# Patient Record
Sex: Female | Born: 1955
Health system: Southern US, Community
[De-identification: ages and names within clinical notes are randomized; demographics above are authoritative.]

## PROBLEM LIST (undated history)

## (undated) ENCOUNTER — Ambulatory Visit: Admission: EM | Payer: Medicare HMO

## (undated) DIAGNOSIS — R519 Headache, unspecified: Secondary | ICD-10-CM

## (undated) DIAGNOSIS — E785 Hyperlipidemia, unspecified: Secondary | ICD-10-CM

## (undated) DIAGNOSIS — R7302 Impaired glucose tolerance (oral): Secondary | ICD-10-CM

## (undated) DIAGNOSIS — J449 Chronic obstructive pulmonary disease, unspecified: Secondary | ICD-10-CM

## (undated) DIAGNOSIS — J329 Chronic sinusitis, unspecified: Secondary | ICD-10-CM

## (undated) DIAGNOSIS — I509 Heart failure, unspecified: Secondary | ICD-10-CM

## (undated) DIAGNOSIS — E119 Type 2 diabetes mellitus without complications: Secondary | ICD-10-CM

## (undated) DIAGNOSIS — J40 Bronchitis, not specified as acute or chronic: Secondary | ICD-10-CM

## (undated) DIAGNOSIS — R51 Headache: Secondary | ICD-10-CM

## (undated) DIAGNOSIS — I2510 Atherosclerotic heart disease of native coronary artery without angina pectoris: Secondary | ICD-10-CM

## (undated) DIAGNOSIS — I10 Essential (primary) hypertension: Secondary | ICD-10-CM

## (undated) DIAGNOSIS — I519 Heart disease, unspecified: Secondary | ICD-10-CM

## (undated) HISTORY — DX: Headache, unspecified: R51.9

## (undated) HISTORY — DX: Chronic obstructive pulmonary disease, unspecified: J44.9

## (undated) HISTORY — DX: Hyperlipidemia, unspecified: E78.5

## (undated) HISTORY — DX: Heart disease, unspecified: I51.9

## (undated) HISTORY — DX: Headache: R51

## (undated) HISTORY — DX: Impaired glucose tolerance (oral): R73.02

## (undated) HISTORY — DX: Atherosclerotic heart disease of native coronary artery without angina pectoris: I25.10

---

## 1999-11-28 ENCOUNTER — Other Ambulatory Visit: Admission: RE | Admit: 1999-11-28 | Discharge: 1999-11-28 | Payer: Self-pay | Admitting: Obstetrics and Gynecology

## 2001-06-16 ENCOUNTER — Emergency Department (HOSPITAL_COMMUNITY): Admission: EM | Admit: 2001-06-16 | Discharge: 2001-06-16 | Payer: Self-pay | Admitting: Emergency Medicine

## 2001-09-09 ENCOUNTER — Other Ambulatory Visit: Admission: RE | Admit: 2001-09-09 | Discharge: 2001-09-09 | Payer: Self-pay | Admitting: Internal Medicine

## 2001-10-21 HISTORY — PX: CORONARY ARTERY BYPASS GRAFT: SHX141

## 2002-03-13 ENCOUNTER — Encounter: Payer: Self-pay | Admitting: Emergency Medicine

## 2002-03-13 ENCOUNTER — Inpatient Hospital Stay (HOSPITAL_COMMUNITY): Admission: EM | Admit: 2002-03-13 | Discharge: 2002-03-21 | Payer: Self-pay | Admitting: Emergency Medicine

## 2002-03-15 ENCOUNTER — Encounter: Payer: Self-pay | Admitting: Thoracic Surgery (Cardiothoracic Vascular Surgery)

## 2002-03-16 ENCOUNTER — Encounter: Payer: Self-pay | Admitting: Thoracic Surgery (Cardiothoracic Vascular Surgery)

## 2002-03-17 ENCOUNTER — Encounter: Payer: Self-pay | Admitting: Thoracic Surgery (Cardiothoracic Vascular Surgery)

## 2002-03-18 ENCOUNTER — Encounter: Payer: Self-pay | Admitting: Thoracic Surgery (Cardiothoracic Vascular Surgery)

## 2002-03-19 ENCOUNTER — Encounter: Payer: Self-pay | Admitting: *Deleted

## 2002-04-06 ENCOUNTER — Encounter (HOSPITAL_COMMUNITY): Admission: RE | Admit: 2002-04-06 | Discharge: 2002-05-17 | Payer: Self-pay | Admitting: Cardiovascular Disease

## 2002-04-19 ENCOUNTER — Encounter: Payer: Self-pay | Admitting: Thoracic Surgery (Cardiothoracic Vascular Surgery)

## 2002-04-19 ENCOUNTER — Encounter
Admission: RE | Admit: 2002-04-19 | Discharge: 2002-04-19 | Payer: Self-pay | Admitting: Thoracic Surgery (Cardiothoracic Vascular Surgery)

## 2002-08-31 ENCOUNTER — Emergency Department (HOSPITAL_COMMUNITY): Admission: EM | Admit: 2002-08-31 | Discharge: 2002-08-31 | Payer: Self-pay | Admitting: Emergency Medicine

## 2002-08-31 ENCOUNTER — Encounter: Payer: Self-pay | Admitting: Emergency Medicine

## 2005-06-06 ENCOUNTER — Emergency Department (HOSPITAL_COMMUNITY): Admission: EM | Admit: 2005-06-06 | Discharge: 2005-06-06 | Payer: Self-pay | Admitting: Emergency Medicine

## 2009-10-17 ENCOUNTER — Observation Stay (HOSPITAL_COMMUNITY): Admission: EM | Admit: 2009-10-17 | Discharge: 2009-10-17 | Payer: Self-pay | Admitting: Emergency Medicine

## 2011-01-21 LAB — CBC
HCT: 48.7 % — ABNORMAL HIGH (ref 36.0–46.0)
MCV: 83.2 fL (ref 78.0–100.0)
Platelets: 238 10*3/uL (ref 150–400)
RDW: 15.9 % — ABNORMAL HIGH (ref 11.5–15.5)
WBC: 9.5 10*3/uL (ref 4.0–10.5)

## 2011-01-21 LAB — COMPREHENSIVE METABOLIC PANEL
AST: 29 U/L (ref 0–37)
Alkaline Phosphatase: 91 U/L (ref 39–117)
CO2: 27 mEq/L (ref 19–32)
Chloride: 102 mEq/L (ref 96–112)
Creatinine, Ser: 0.51 mg/dL (ref 0.4–1.2)
GFR calc Af Amer: >60 mL/min (ref >60)
GFR calc non Af Amer: >60 mL/min (ref >60)
Potassium: 4.1 mEq/L (ref 3.5–5.1)
Total Bilirubin: 0.5 mg/dL (ref 0.3–1.2)

## 2011-01-21 LAB — DIFFERENTIAL
Basophils Absolute: 0.1 10*3/uL (ref 0.0–0.1)
Basophils Relative: 1 % (ref 0–1)
Eosinophils Absolute: 0.3 10*3/uL (ref 0.0–0.7)
Eosinophils Relative: 3 % (ref 0–5)
Lymphocytes Relative: 31 % (ref 12–46)

## 2011-01-21 LAB — TROPONIN I: Troponin I: 0.04 ng/mL (ref 0.00–0.06)

## 2011-03-08 NOTE — Consult Note (Signed)
[place]. [facility]  Patient:    [name], [name] Visit Number: [id number] MRN: [id number]          Service Type: MED Location: [phone] Attending Physician:  Tressie Stalker Dictated by:   Salvatore Decent. Cornelius Moras, M.D. Proc. Date: 03/15/02 Admit Date:  03/13/2002   CC:         Lennette Bihari, M.D.   Consultation Report  REQUESTING PHYSICIAN:  Lennette Bihari, M.D.  REASON FOR CONSULTATION:  Left main disease with class IV unstable angina.  HISTORY OF PRESENT ILLNESS:  [name] is a 55 year old African-American female with no previous cardiac history, but risk factors including long-standing tobacco abuse and newly diagnosed hyperlipidemia.  She additionally has been using diet pills recently at home and she states that she has been under considerable stress.  On the morning of May 24 she was awoke from her sleep with severe substernal chest pressure associated with shortness of breath.  This pain ultimately subsided, but recurred later in the day and was associated with increased shortness of breath.  She was brought to the emergency room by EMS.  En route electrocardiogram was noted to have T-wave inversion in the lateral leads.  The patient was given sublingual nitroglycerin and her chest pain promptly resolved and her EKG normalized. She was admitted to the hospital and ruled out for acute myocardial infarction.  She had borderline positive troponins at 0.14.  She remained stable on IV Integrilin, nitroglycerin, and oral Plavix therapy.  She was taken for cardiac catheterization today by Dr. Tresa Endo.  This demonstrates 80% ostial stenosis of the left main coronary artery.  There is normal left ventricular function.  Cardiac surgical consultation is requested.  REVIEW OF SYSTEMS:  GENERAL:  The patient reports that she has been gaining weight recently which she thinks is related to eating pretzels and high salt content.  She has been under  considerable stress recently.  CARDIAC:  The patient describes a two month history of progressive symptoms of dyspnea on exertion.  She denies any history of angina prior to that which brought her in the hospital.  She has had some palpitations and dizzy spells, but denies any syncopal episodes.  She reports no problems with resting shortness of breath, PND, or orthopnea.  She has not had any lower extremity edema.  RESPIRATORY: Essentially negative other than dyspnea on exertion.  The patient denies productive cough, hemoptysis, or wheezing.  GASTROINTESTINAL:  Notable for chronic constipation.  The patient denies problems with hematochezia, hematemesis, or melena.  INFECTIOUS:  Negative.  The patient denies recent fevers or chills.  HEMATOLOGIC:  Negative.  The patient denies problems with bleeding diathesis, easy bruising, or frequent episodes of epistaxis. PSYCHIATRIC:  The patient is noted to have been under stress recently.  HEENT: Negative.  The patient does note some decreased visual acuity for which she has seen her ophthalmologist recently and had a change in her prescription. MUSCULOSKELETAL:  Negative.  The patient denies problems with arthritis or arthralgias.  PERIPHERAL VASCULAR:  Negative.  The patient denies symptoms of claudication.  NEUROLOGIC:  Negative.  The patient specifically denies any history of transient monocular blindness or transient numbness or weakness involving either upper or lower extremity.  PAST MEDICAL HISTORY:  Negative.  The patient denies any known history of coronary artery disease, congestive heart failure, hypertension, diabetes, or previous stroke.  She does have long-standing history of tobacco abuse.  She has mild hyperlipidemia diagnosed this hospital admission  and she probably has hypertension as well.  PAST SURGICAL HISTORY:  Negative.  FAMILY HISTORY:  Negative and notable for the absence of early onset coronary artery  disease.  SOCIAL HISTORY:  The patient is single and lives with two sons.  She has a long-standing history of tobacco abuse, smoking one-half pack cigarettes per day for more than 25 years.  She denies excessive alcohol consumption.  MEDICATIONS PRIOR TO ADMISSION:  Include only diet pills (dymetadrine X-treme).  ALLERGIES:  The patient denies any known drug allergies or sensitivities.  PHYSICAL EXAMINATION  GENERAL:  Well-appearing African-American female who appears her stated age in no acute distress.  HEENT:  Notable for poor dentition.  NECK:  Supple.  There is no cervical or supraclavicular lymphadenopathy. There is no jugular venous distention.  No carotid bruits are noted.  CHEST:  Auscultation of the chest reveals clear and symmetrical breath sounds bilaterally.  No wheezes or rhonchi are noted.  CARDIOVASCULAR:  Demonstrates regular rate and rhythm.  No murmurs, rubs, or gallops are demonstrated.  ABDOMEN:  Mildly obese, soft, nontender.  There are no palpable masses.  The liver edge is not enlarged.  Bowel sounds are present.  EXTREMITIES:  Warm and well perfused.  There is no lower extremity edema. Distal pulses are diminished, although palpable in both lower legs in the dorsalis pedis position.  There is no venous insufficiency.  RECTAL:  Deferred.  GENITOURINARY:  Deferred.  NEUROLOGIC:  Grossly nonfocal and symmetrical throughout.  SKIN:  Clean and dry and healthy appearing throughout.  The remainder of her physical examination is unrevealing.  DIAGNOSTIC TESTS:  Cardiac catheterization performed today by Dr. Tresa Endo demonstrates ostial 80% stenosis of the left main coronary artery.  There is a large first diagonal branch off the left anterior descending which has 90-95% proximal stenosis.  There is right dominant coronary circulation and there are minor irregularities in the right coronary artery with proximal 25-30% stenosis.  Left ventricular function  appears preserved with ejection fraction estimated 54%.   IMPRESSION:  Left main disease with new onset class IV unstable angina.  PLAN:  We tentatively plan to proceed with coronary artery bypass grafting first case tomorrow morning.  I have outlined at length the indications and potential benefits of surgery with Ms. Hustead and her family.  They understand and accept all associated risks of surgery including, but not limited to, risk of death, stroke, myocardial infarction, respiratory failure, bleeding requiring blood transfusion, arrhythmia, infection, and recurrent coronary artery disease.  They understand the considerable increased risk of bleeding complication and need for blood transfusion because of the patients recent therapy with Plavix.  However, given the high grade left main coronary stenosis with history of unstable angina, I believe that we should proceed with surgery rather than waiting the typical five to seven days to allow the Plavix effects to dissipate.  Ms. Lal and her family understand and accept.  All of their questions have been addressed. Dictated by:   Salvatore Decent Cornelius Moras, M.D. Attending Physician:  Tressie Stalker DD:  03/15/02 TD:  03/16/02 Job: [id number]

## 2011-03-08 NOTE — Op Note (Signed)
[place]. [facility]  Patient:    [name], [name] Visit Number: [id number] MRN: [id number]          Service Type: MED Location: 2000 2034 01 Attending Physician:  Tressie Stalker Dictated by:   Salvatore Decent. Cornelius Moras, M.D. Proc. Date: 03/16/02 Admit Date:  03/13/2002   CC:         Lennette Bihari, M.D.   Operative Report  PREOPERATIVE DIAGNOSIS:  Left main disease with class IV unstable angina.  POSTOPERATIVE DIAGNOSIS:  Left main disease with class IV unstable angina.  OPERATION PERFORMED:  Median sternotomy for coronary artery bypass grafting time three (left internal mammary artery to distal left anterior descending coronary artery, right internal mammary artery to first diagonal branch, saphenous vein graft to circumflex marginal branch).  SURGEON:  Salvatore Decent. Cornelius Moras, M.D.  ASSISTANT: 1. Salvatore Decent. Dorris Fetch, M.D. 2. Dominica Severin, P.A.  ANESTHESIA:  General.  INDICATIONS FOR PROCEDURE:  The patient is a 55 year old African-American female with no previous cardiac history but a history of longstanding tobacco abuse.  She was admitted to the hospital on Mar 13, 2002 by Dr. Nicki Guadalajara with class IV unstable angina.  She ruled out for acute myocardial infarction. Cardiac catheterization performed on Mar 15, 2002 demonstrated 80% ostial left main coronary stenosis.  Left ventricular function was well preserved.  A full consultation note has been dictated previously.  OPERATIVE CONSENT:  The patient had been counseled at length regarding the indications and potential benefits of coronary artery bypass grafting. Alternative treatment strategies and prognosis have been discussed.  She understands and accepts all associated risks of surgery including but not limited to the risks of death, stroke, myocardial infarction, bleeding requiring blood transfusion, arrhythmia, infection, and recurrent coronary artery disease.  She understands there is somewhat  increased risk of need for blood transfusion and/or bleeding complications related to the patients history of Plavix therapy.  All of her questions have been addressed.  DESCRIPTION OF PROCEDURE:  The patient was brought to the operating room on the above-mentioned date and central invasive hemodynamic monitoring was established by the anesthesia service under the care and direction of Dr. Kipp Brood.  Specifically, a Swan-Ganz catheter was placed through the right internal jugular approach.  A radial arterial line was placed. Intravenous antibiotics were administered.  The patient was placed in supine position on the operating table.  Following induction of general endotracheal anesthesia, the patients chest, abdomen, both groins, and both lower extremities were prepared and draped in a sterile manner.  A median sternotomy incision was performed and the left internal mammary artery was dissected from the chest wall and prepared for bypass grafting. The left internal mammary artery was notably good quality conduit. Subsequently, the right internal mammary artery was also dissected from the chest wall and prepared for bypass grafting.  This vessel was also a good quality conduit.  Simultaneously saphenous vein was obtained from the patients right thigh through a longitudinal incision.  The saphenous vein was a good quality conduit.  The patient was heparinized systemically.  The pericardium was opened. The ascending aorta was normal in appearance.  The ascending aorta and right atrium were cannulated for cardiopulmonary bypass.  Adequate heparinization was verified.  Cardiopulmonary bypass was begun and the surface of the heart was inspected.  Distal sites were selected for coronary artery bypass grafting.  Of note, the first diagonal branch of the left anterior descending coronary artery was actually a fairly large vessel and supplied the majority  of the anterolateral wall of the left  ventricle.  It was considerably larger than any of the circumflex marginal branches.  Both internal mammary arteries and the saphenous vein conduit were prepared for bypass grafting.  A temperature probe was placed in the left ventricular septum and a styrofoam pad was placed to protect the left phrenic nerve from thermal injury.  A cardioplegia catheter was placed in the ascending aorta.  The patient was cooled to 28 degrees systemic temperature.  The aortic crossclamp was applied and cardioplegia was delivered in antegrade fashion through the aortic root.  Iced saline slush was applied for topical hypothermia.  The initial cardioplegic arrest and myocardial cooling were felt to be excellent.  Additional doses of cardioplegia were administered intermittently throughout the crossclamp portion of the operation both through the aortic root and down the subsequently placed vein grafts to maintain septal temperature below 15 degrees centigrade.  The following distal coronary anastomoses were performed:  (1) The circumflex marginal branch was grafted with the saphenous vein graft in an end-to-side fashion.  This coronary measured 1.4 mm in diameter and was of good quality.  (2) The first diagonal branch off the left anterior descending coronary artery was grafted with the right internal mammary artery in an end-to-side fashion.  The right internal mammary artery pedicle was placed through the transverse sinus posterior to the aorta.  The right internal mammary artery graft was therefore utilized in situ.  The first diagonal branch measured 1.7 mm in diameter and was of good quality.  (3) The distal left anterior descending was grafted with the left internal mammary artery in end-to-side fashion.  This coronary measured 1.7 mm in diameter and was of good quality.  The single proximal saphenous vein anastomosis was performed directly to the ascending aorta prior to removal ofr the aortic  crossclamp.  The septal temperature was noted to rise  rapidly and  dramatically upon reperfusion of the left internal mammary artery and the right internal mammary artery.  The heart began to beat spontaneously.  All air was evacuated from the aortic root.  The aortic crossclamp was removed after a total crossclamp time of 50 minutes.  The proximal and all three distal anastomoses were carefully inspected for hemostasis and appropriate graft orientation.  The patient was rewarmed to greater than 37 degrees centigrade  temperature.  Normal sinus rhythm resumed spontaneously.  The patient was weaned from cardiopulmonary bypass without difficulty.  The patients rhythm at separation from bypass was normal sinus rhythm.  No inotropic support was required.  Total cardiopulmonary bypass time for the operation was 60 minutes.  The venous and arterial cannulae were removed uneventfully.  Protamine was administered to reverse the anticoagulation.  The mediastinum and both left and right pleural spaces were irrigated with saline solution containing vancomycin.  Meticulous surgical hemostasis was ascertained.  The mediastinum and the left chest were drained with four chest tubes placed through separate stab incisions inferiorly.  The median sternotomy was closed in a routine fashion.  The right thigh incision was closed in multiple layers in routine fashion. All skin incisions were closed with subcuticular skin closures.  The patient tolerated the procedure well and was transported to the surgical intensive care unit in stable condition.  There were no intraoperative complications.  All sponge, needle and instrument counts were verified correct at the completion of the operation.  No blood products were administered. Dictated by:   Salvatore Decent Cornelius Moras, M.D. Attending Physician:  Tressie Stalker DD:  03/16/02 TD:  03/17/02 Job: [id number]

## 2011-03-08 NOTE — Cardiovascular Report (Signed)
[place]. [facility]  Patient:    [name], [name] Visit Number: [id number] MRN: [id number]          Service Type: MED Location: 2300 2399 01 Attending Physician:  Virgina Evener Dictated by:   Lennette Bihari, M.D. Proc. Date: 03/15/02 Admit Date:  03/13/2002   CC:         Orville Govern, R.N.   Cardiac Catheterization  INDICATIONS: The patient is a 55 year old, African American female who was admitted to [facility] on Saturday, Mar 14, 2002, with chest pain suggestive of unstable angina. She initially had T wave inversion with ST segment depression inferolaterally, which normalized following nitroglycerin administration. Consequently, she was treated with IV Integrilin, nitroglycerin, as well as heparin, as well as aspirin and beta blocker therapy. Troponin was 0.14. ECG remained normal. She is referred for definitive diagnostic catheterization.  DESCRIPTION OF PROCEDURE: After premedication with Valium 5 mg intravenously, the patient was prepped and draped in the usual fashion. The right femoral artery was punctured anteriorly and a 6 French sheath was inserted. Initially catheterization was done with 6 French Judkins 4 left coronary catheter. There was significant dampening in the left main at a site of probable stenosis. Consequently, this was reduced to a 5 [place] catheter, which confirmed high-grade proximal ostial left main disease. IC nitroglycerin was also administered to make certain that this was not spasm without benefit. A Judkins 4 6 French right coronary artery catheter was used. Angiography in the right coronary artery as well as left subclavian internal mammary artery system due to probable need for CBG surgery. A 6 French pigtail was used for biplane cine left ventriculography as well as distal aortography. Hemostasis was obtained by direct manual pressure.  HEMODYNAMIC DATA: Central aortic pressure was 114/6, left  ventricular pressure 114/11.  ANGIOGRAPHIC DATA: Left main coronary artery had ostial proximal 80% focal stenosis prior to bifurcating into an LAD and left circumflex system. FollowiNG IC nitroglycerin administration, there was no change and this appeared to be obstructive disease rather than a spasm.  The LAD gave rise to a very proximal diagonal vessel. There was 95% focal stenosis in the proximal portion of this very high diagonal vessel. The remainder of the LAD was free of significant disease and gave rise to two small additional diagonal vessels and extended to the apex.  The circumflex vessel was angiographically normal and gave rise to one prominent circumflex marginal vessel.  The right coronary artery was a large caliber, dominant vessel. There was a focal 30% proximal narrowing.  The left subclavian and internal mammary artery were normal and suitable for CBG surgery.  Biplane cine left ventriculography revealed normal LV function. Ejection fraction is 54%. There is suggestion of subtle apical minimal hypercontractility.  DISTAL AORTOGRAM: Distal aortography demonstrated an normal aortoiliac system. There was no renal artery stenosis.  IMPRESSION: 1. Normal left ventricular function (ejection fraction 54%). 2. High-grade 80% ostial proximal left main coronary artery, unrelieved    following intracoronary nitroglycerin administration, and 95%    proximal stenosis in a high diagonal vessel off the left anterior    descending artery. 3. Mild irregularity with 20-30% narrowing in the proximal right coronary    artery.  RECOMMENDATIONS: CBG revascularization surgery. Dictated by:   Lennette Bihari, M.D. Attending Physician:  Virgina Evener DD:  03/15/02 TD:  03/16/02 Job: [id number]

## 2011-03-08 NOTE — Discharge Summary (Signed)
[place]. [facility]  Patient:    [name], [name] Visit Number: [id number] MRN: [id number]          Service Type: MED Location: 2000 2034 01 Attending Physician:  Tressie Stalker Dictated by:   Tollie Pizza. Collins, P.A.-C. Admit Date:  03/13/2002 Discharge Date: 03/21/2002   CC:         CVTS Office  Lennette Bihari, M.D.   Discharge Summary  PRIMARY ADMITTING DIAGNOSES: 1. Left main coronary artery disease. 2. Class 4 unstable angina.  ADDITIONAL DIAGNOSES: 1. Hyperlipidemia. 2. Postoperative anemia.  PROCEDURES PERFORMED: 1. Cardiac catheterization. 2. Coronary artery bypass grafting x3 (left internal mammary artery to the    distal LAD, right internal mammary artery to the first diagonal,    saphenous vein graft to the circumflex marginal).  HISTORY OF PRESENT ILLNESS:  The patient is a 55 year old black female who on the date of this admission developed acute onset of substernal chest pain with associated shortness of breath. She experienced one episode of the pain which subsided. Later she had an additional episode and at that time elected to proceed to the ER for further evaluation. Her initial EKG showed T wave inversion while in route to the hospital. She was given nitroglycerin and once she reached [facility] her EKG changes had resolved. It was felt that in light of her symptoms and her EKG changes that she should be admitted and ruled out for myocardial infarction.  HOSPITAL COURSE:  She is admitted and stabilized on IV nitroglycerin and integrilin as well as p.o. Plavix. She ultimately did rule out for myocardial infarction; however, it was recommended that she proceed with cardiac catheterization. This was performed on May 26 and she was found to have an 80% stenosis of the ostial left main as well as significant multivessel coronary artery disease. It was felt to be in her best interest to proceed with surgical  revascularization at this time. She was seen in consultation by Dr. Cornelius Moras and was taken to the operating room on May 27 where she underwent CABG x3 with the above noted grasper. She tolerated the procedure well and was transferred to the ICU in stable condition. She was extubated shortly after surgery and was hemodynamically stable postoperative day one. At that time, she was able to be transferred to the floor. Postoperatively she has done well. She had been somewhat volume overloaded and has been diuresed back down to more than 2 pounds of her preoperative weight. She experienced one episode of supraventricular tachycardia and her beta blocker dose was increased. Since that time she has remained in sinus rhythm. She has also been anemic and at one point, her hemoglobin and hematocrit reached 7.1 and 21.7 respectively. At that time, she was transfused with 1 unit of packed red blood cells. She was started on p.o. iron replacement therapy. At the present, her anemia has improved with hemoglobin of 8.3 and hematocrit of 25.1 on the date of discharge. She has been tolerating a regular diet and is having normal bowel and bladder function. She has been ambulating in the halls without difficulty. Her incisions are healing well, she has remained afebrile and all other vital signs have been stable. It is felt at this time that she is ready for discharge home.  DISCHARGE MEDICATIONS: 1. Enteric coated aspirin 325 mg q.d. 2. Zocor 40 mg q.h.s. 3. Niferex 150 mg b.i.d. 4. Tylox 1-2 q. 4h p.r.n. for pain. 5. Toprol XL 50 mg p.o. q.d.  DISCHARGE INSTRUCTIONS:  She is to refrain from driving, heavy lifting of strenuous activity. She may continue daily walking and use of incentive spirometry. She is asked to shower daily and clean her incisions with soap and water. She may continue a low fat, low sodium diet.  DISCHARGE FOLLOW-UP:  She will see Dr. Tresa Endo in the office in two weeks. The CVTS office  will call to schedule a three week appointment with Dr. Cornelius Moras. Prior to that appointment, she is asked have a chest x-ray at the [facility] and to bring her x-ray for his review. Dictated by:   Tollie Pizza Collins, P.A.-C. Attending Physician:  Tressie Stalker DD:  04/14/02 TD:  04/16/02 Job: [id number]

## 2011-05-07 ENCOUNTER — Observation Stay (HOSPITAL_COMMUNITY)
Admission: EM | Admit: 2011-05-07 | Discharge: 2011-05-08 | DRG: 143 | Disposition: A | Discharge status: Home / Self Care | Payer: BC Managed Care – PPO | Attending: Internal Medicine | Admitting: Internal Medicine

## 2011-05-07 ENCOUNTER — Emergency Department (HOSPITAL_COMMUNITY): Payer: BC Managed Care – PPO

## 2011-05-07 DIAGNOSIS — R262 Difficulty in walking, not elsewhere classified: Secondary | ICD-10-CM | POA: Insufficient documentation

## 2011-05-07 DIAGNOSIS — Z951 Presence of aortocoronary bypass graft: Secondary | ICD-10-CM | POA: Insufficient documentation

## 2011-05-07 DIAGNOSIS — I2510 Atherosclerotic heart disease of native coronary artery without angina pectoris: Secondary | ICD-10-CM | POA: Insufficient documentation

## 2011-05-07 DIAGNOSIS — R42 Dizziness and giddiness: Secondary | ICD-10-CM | POA: Insufficient documentation

## 2011-05-07 DIAGNOSIS — R0789 Other chest pain: Principal | ICD-10-CM | POA: Insufficient documentation

## 2011-05-07 DIAGNOSIS — E876 Hypokalemia: Secondary | ICD-10-CM | POA: Insufficient documentation

## 2011-05-07 DIAGNOSIS — F17200 Nicotine dependence, unspecified, uncomplicated: Secondary | ICD-10-CM | POA: Insufficient documentation

## 2011-05-07 DIAGNOSIS — R0602 Shortness of breath: Secondary | ICD-10-CM | POA: Insufficient documentation

## 2011-05-07 DIAGNOSIS — K219 Gastro-esophageal reflux disease without esophagitis: Secondary | ICD-10-CM | POA: Insufficient documentation

## 2011-05-07 LAB — BASIC METABOLIC PANEL
BUN: 10 mg/dL (ref 6–23)
Creatinine, Ser: <0.47 mg/dL — ABNORMAL LOW (ref 0.50–1.10)
Glucose, Bld: 107 mg/dL — ABNORMAL HIGH (ref 70–99)

## 2011-05-07 LAB — CBC
MCHC: 35.1 g/dL (ref 30.0–36.0)
Platelets: 154 10*3/uL (ref 150–400)
RDW: 13.9 % (ref 11.5–15.5)
WBC: 8.1 10*3/uL (ref 4.0–10.5)

## 2011-05-07 LAB — DIFFERENTIAL
Basophils Absolute: 0 10*3/uL (ref 0.0–0.1)
Basophils Relative: 0 % (ref 0–1)
Eosinophils Absolute: 0.3 10*3/uL (ref 0.0–0.7)
Eosinophils Relative: 3 % (ref 0–5)
Lymphocytes Relative: 31 % (ref 12–46)
Monocytes Absolute: 0.5 10*3/uL (ref 0.1–1.0)

## 2011-05-07 LAB — TROPONIN I: Troponin I: <0.3 ng/mL (ref <0.30)

## 2011-05-07 LAB — CK TOTAL AND CKMB (NOT AT ARMC)
CK, MB: 1.6 ng/mL (ref 0.3–4.0)
Relative Index: INVALID (ref 0.0–2.5)

## 2011-05-08 ENCOUNTER — Encounter (HOSPITAL_COMMUNITY): Payer: BC Managed Care – PPO

## 2011-05-08 LAB — BASIC METABOLIC PANEL
BUN: 11 mg/dL (ref 6–23)
Chloride: 103 mEq/L (ref 96–112)
Creatinine, Ser: 0.56 mg/dL (ref 0.50–1.10)
GFR calc Af Amer: >60 mL/min (ref >60)
GFR calc non Af Amer: >60 mL/min (ref >60)
Potassium: 4.1 mEq/L (ref 3.5–5.1)

## 2011-05-08 LAB — CARDIAC PANEL(CRET KIN+CKTOT+MB+TROPI)
CK, MB: 1.5 ng/mL (ref 0.3–4.0)
Relative Index: INVALID (ref 0.0–2.5)
Troponin I: <0.3 ng/mL (ref <0.30)

## 2011-05-08 MED ORDER — TECHNETIUM TC 99M TETROFOSMIN IV KIT
10.0000 | PACK | Freq: Once | INTRAVENOUS | Status: AC | PRN
  Administered 2011-05-08: 10 via INTRAVENOUS

## 2011-05-08 MED ORDER — TECHNETIUM TC 99M TETROFOSMIN IV KIT
30.0000 | PACK | Freq: Once | INTRAVENOUS | Status: AC | PRN
  Administered 2011-05-08: 30 via INTRAVENOUS

## 2011-05-09 ENCOUNTER — Other Ambulatory Visit (HOSPITAL_COMMUNITY): Payer: BC Managed Care – PPO

## 2011-05-09 NOTE — Consult Note (Signed)
[name], [name]              ACCOUNT NO.:  [id number]  MEDICAL RECORD NO.:  [id number]  LOCATION:  3740                         FACILITY:  [facility]  PHYSICIAN:  Armanda Magic, M.D.     DATE OF BIRTH:  1956-03-27  DATE OF CONSULTATION:  05/07/2011 DATE OF DISCHARGE:                                CONSULTATION   PRIMARY CARDIOLOGIST:  Loraine Leriche C. Anne Fu, MD  PRIMARY CARE PHYSICIAN:  Stacie Acres. White, MD  CHIEF COMPLAINT:  Chest pain.  HISTORY OF PRESENT ILLNESS:  This is a 55 year old female who developed lightheadedness yesterday associated with shooting chest pain this morning starting approximately 10:30 to 11:00 a.m.  She states she was at work and began feeling lightheaded.  She then developed some mild shortness of breath and chest pain.  She felt dizzy when she tried to walk around and had to fall on to something but did not have any syncope.  The patient states that initially this chest pain was sharp and stabbing lasting only a few seconds but then developed chest pressure and diaphoresis.  She contacted the EMS and was brought to the emergency room.  While in the ambulance, she did receive 1 sublingual nitroglycerin which improved her chest discomfort.  She currently is asymptomatic without chest pain, shortness of breath, or palpitations.  PAST MEDICAL HISTORY: 1. CAD with left main coronary artery disease status post CABG in     2003. 2. Ongoing tobacco abuse.  PAST SURGICAL HISTORY:  CABG and tubal ligation.  MEDICATIONS:  Aspirin 325 mg daily.  ALLERGIES:  None.  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  She smokes about 1/2-pack of cigarettes daily.  She drinks socially.  REVIEW OF SYSTEMS:  Other than stated in HPI are negative.  FAMILY HISTORY:  Noncontributory.  PHYSICAL EXAMINATION:  GENERAL:  This is a well-developed, well- nourished female in no acute distress. HEENT:  Benign. NECK:  Supple without lymphadenopathy.  Carotid upstrokes are  +2 bilaterally.  No bruits. LUNGS:  Clear to auscultation throughout. HEART:  Regular rate and rhythm.  No murmurs, rubs, or gallops.  Normal S1-S2. ABDOMEN:  Soft, nontender, nondistended with active bowel sounds.  No hepatosplenomegaly. EXTREMITIES:  No cyanosis, erythema, or edema.  +2 dorsalis pedis pulses bilaterally.  DIAGNOSTIC DATA:  EKG shows normal sinus rhythm with no ST changes.  LABORATORY DATA:  White cell count 8.1, hemoglobin 17.4, hematocrit 49.6, and platelet count 154.  CPK is 48, 45; MB 1.6, 1.6; troponin less than 0.3 x2.  Sodium 140, potassium 3.5, chloride 102, CO2 27, glucose 107, BUN 10, and creatinine less than 0.47.  Chest x-ray shows no evidence of acute cardiopulmonary disease.  ASSESSMENT: 1. Atypical and typical chest pain with initial enzymes negative x2     and EKG with no ischemia.  Her pain did improve with one sublingual     nitroglycerin. 2. Coronary artery disease status post remote coronary artery bypass     graft. 3. Ongoing tobacco abuse.  PLAN:  Admit to Telemetry Unit and cycle cardiac enzymes.  If cardiac enzymes are negative, then we would proceed with stress Cardiolite study in the morning.  Agree with starting omeprazole  40 mg a day for possible GERD.  Recommend tobacco cessation counseling.  Will continue aspirin and subcu Lovenox per pharmacy protocol until rule out complete.     Armanda Magic, M.D.     TT/MEDQ  D:  05/07/2011  T:  05/08/2011  Job:  [id number]  cc:   Jake Bathe, MD  Electronically Signed by Armanda Magic M.D. on 05/09/2011 09:18:00 AM

## 2011-05-09 NOTE — H&P (Signed)
[name], [name] NO.:  [id number]  MEDICAL RECORD NO.:  [id number]  LOCATION:  [facility]                         FACILITY:  [facility]  PHYSICIAN:  Jonny Ruiz, MD    DATE OF BIRTH:  1955-12-30  DATE OF ADMISSION:  05/07/2011 DATE OF DISCHARGE:                             HISTORY & PHYSICAL   CARDIOLOGY:  Jake Bathe, MD  PRIMARY CARE PHYSICIAN:  Stacie Acres. White, MD  CHIEF COMPLAINT:  Lightheadedness.  HISTORY OF PRESENT ILLNESS:  The patient is a 55 year old female who developed lightheadedness yesterday associated to shooting chest pain. This morning while the patient was in work, she began feeling lightheaded.  She had chest pain with mild shortness of breath.  She also felt somewhat dizzy and when she tried to walk around, she had to fall onto something and noted not to fall.  The patient never lost consciousness.  The patient asked her coworker to bring her to emergency department and she contacted EMS and patient was brought to our emergency department.  While in the ambulance, she received 1 sublingual nitro and made her feel better.  At the time of her arrival, the patient was asymptomatic.  She denied any chest pain, shortness of breath or palpitations.  PAST MEDICAL HISTORY: 1. Left main coronary artery disease, status post CABG in 2003. 2. Active tobacco user.  SURGICAL HISTORY: 1. CABG. 2. Tubal ligation many years ago.  MEDICATIONS:  Aspirin 325 mg a day.  ALLERGIES:  No known drug allergies.  SOCIAL HISTORY:  The patient works a Airline pilot women.  She smokes about half a pack her day.  She drinks socially.  REVIEW OF SYSTEMS:  CONSTITUTIONAL:  She denies fever, chills, or night sweats.  CARDIOVASCULAR:  Denies orthopnea, nocturia or PND.  Denies edema.  RESPIRATORY:  Admit to an occasional cough, productive of white sputum.  No hemoptysis.  GASTROINTESTINAL:  The patient states that she had associated heartburn and reflux yesterday  while she was having the chest discomfort.  Denies nausea, vomiting, hematochezia or melena.  No diarrhea or constipation.  GENITOURINARY:  Denies dysuria, frequency, hematuria or vaginal discharge.  NEUROLOGIC:  Denies headache, focal weakness, numbness or paresthesias.  PHYSICAL EXAMINATION:  VITAL SIGNS:  The blood pressure 111/90, pulse 67, respirations 20, temperature 98.4, O2 sat 97%. GENERAL APPEARANCE:  The patient is an elderly white middle age woman who appears in no acute distress. SKIN:  Mucosa normal. HEENT:  Unremarkable. NECK:  Supple without carotid bruit.  No JVD.  No lymphadenopathy. Thyroid normal. HEART:  Regular, S1 and S2 without gallops, murmurs or rubs. LUNGS:  Distant expiratory wheezes on the right base. ABDOMEN:  Nondistended with normal bowel sounds.  No bruits.  Soft, nontender without organomegaly or masses palpable. EXTREMITIES:  Without clubbing, cyanosis or edema. NEUROLOGIC:  Awake, alert and oriented x3, EOMI.  Cranial nerves II through XII intact.  Moderate strength 5/5.  Sensory grossly intact. Babinski negative.  Gait not assessed.  Reflex is 2+ DTR.  LABORATORY DATA:  First set of cardiac enzymes including total CK, CK-MB and troponin negative.  Sodium 140, potassium 3.5, chloride 102, carbon dioxide 27, glucose 107, BUN 10, creatinine 0.47,  calcium 9.1.  WBC 8.1, hemoglobin 17.4, hematocrit 49.6, platelet count 154.  EKG normal sinus rhythm at 63 beats per minute with normal axis, normal intervals, normal QRS, normal ST and T wave changes, essentially normal.  Chest x-ray, no evidence of acute cardiopulmonary disease.  MEDICAL DECISION MAKING: 24. A 55 year old female presenting with lightheadedness and chest pain     in the patient with established coronary artery disease, status     post CABG for left main disease.  On admission to the hospital,     cardiac telemetry unit warranted.  She was placed on aspirin 81 mg     a day along with  simvastatin 80 mg a day.  She will be ruled out by     serial cardiac enzymes on EKG.  Consultation with Dr. Anne Fu from     Cardiology.  Echocardiogram in the morning.  Decision about stress     testing/cardiac cath will be left to Dr. Anne Fu. 2. Mild hypokalemia.  We will replace with K-Dur 40 mEq p.o. q. 12 x2     doses.  We checked BMP in a.m. 3. Tobacco dependence.  The patient is advised to quit smoking.  She     will be placed on nicotine patch 21 mg per day. 4. Gastroesophageal reflux disease.  The patient will be placed on     omeprazole 40 mg a day.          ______________________________ Jonny Ruiz, MD     GL/MEDQ  D:  05/07/2011  T:  05/07/2011  Job:  [id number]  cc:   Stacie Acres. Cliffton Asters, M.D.  Electronically Signed by Jonny Ruiz MD on 05/09/2011 09:45:32 AM

## 2011-05-28 NOTE — Discharge Summary (Signed)
  [name], [name]              ACCOUNT NO.:  [id number]  MEDICAL RECORD NO.:  [id number]  LOCATION:  3740                         FACILITY:  [facility]  PHYSICIAN:  Zannie Cove, MD     DATE OF BIRTH:  November 24, 1955  DATE OF ADMISSION:  05/07/2011 DATE OF DISCHARGE:  05/08/2011                              DISCHARGE SUMMARY   PRIMARY CARE PHYSICIAN:  Stacie Acres. Cliffton Asters, MD  CARDIOLOGIST:  Veverly Fells. Anne Fu, MD  DISCHARGE DIAGNOSES: 1. Atypical chest pain likely noncardiac. 2. Coronary artery disease, status post coronary artery bypass graft     in 2003. 3. Tobacco use. 4. Polycythemia of unknown etiology, needs further workup.  CONSULTANTS:  Dr. Armanda Magic with [facility].  DIAGNOSTICS AND INVESTIGATIONS: 1. Chest x-ray on July 17, no evidence for acute cardiopulmonary     disease. 2. A 2-D echocardiogram showed LV cavity size normal, EF of 60-65%, no     regional wall motion abnormalities. 3. Myoview on July 18, showed no evidence of stress-induced ischemia,     EF of 62%, septal wall hypokinesia as described.  HOSPITAL COURSE: 1. [name] is a 55 year old female with history of CAD, status     post CABG secondary to left main ________ back in 2003 and  tobacco     use, presented to the hospital with lightheadedness and associated     symptoms of shortness of breath and vague chest pain.  Because of     prior history of CAD as well as tobacco use, was admitted to the     hospital to be ruled out to resolve based on EKG as well as three     sets of cardiac markers.  She had an echo done which did not show     any wall motion abnormalities.  Also seen by Cardiology Dr. Mayford Knife     in associates in consultation, who felt that since her enzymes were     negative would proceed with stress Cardiolite and if this was     negative, did not feel that any further workup which was required     in the hospital.  Subsequently, Cardiolite did not show any     abnormalities as  suspected and the patient was felt to be stable to     be discharged home on a PPI trial. 2. Polycythemia.  This was detected based on CBC done in the hospital.     We will defer further workup for this to her primary physician     regarding for this workup for the etiologies of polycythemia.  DISCHARGE CONDITION:  Stable.  DISCHARGE FOLLOWUP:  With Dr. Laurann Montana in 7-10 days.     Zannie Cove, MD     PJ/MEDQ  D:  05/23/2011  T:  05/23/2011  Job:  [id number]  cc:   Stacie Acres. Cliffton Asters, M.D.  Electronically Signed by Zannie Cove  on 05/28/2011 04:56:55 PM

## 2012-06-22 ENCOUNTER — Encounter (HOSPITAL_COMMUNITY): Payer: Self-pay

## 2012-06-22 ENCOUNTER — Emergency Department (HOSPITAL_COMMUNITY)
Admission: EM | Admit: 2012-06-22 | Discharge: 2012-06-22 | Disposition: A | Discharge status: Home / Self Care | Payer: BC Managed Care – PPO | Source: Home / Self Care | Attending: Family Medicine | Admitting: Family Medicine

## 2012-06-22 DIAGNOSIS — J410 Simple chronic bronchitis: Secondary | ICD-10-CM

## 2012-06-22 DIAGNOSIS — J40 Bronchitis, not specified as acute or chronic: Secondary | ICD-10-CM

## 2012-06-22 DIAGNOSIS — J019 Acute sinusitis, unspecified: Secondary | ICD-10-CM

## 2012-06-22 DIAGNOSIS — Z720 Tobacco use: Secondary | ICD-10-CM

## 2012-06-22 MED ORDER — DEXTROMETHORPHAN POLISTIREX 30 MG/5ML PO LQCR: 60.0000 mg | ORAL | Status: DC | PRN

## 2012-06-22 MED ORDER — AMOXICILLIN-POT CLAVULANATE 875-125 MG PO TABS: 1.0000 | ORAL_TABLET | Freq: Two times a day (BID) | ORAL | Status: AC

## 2012-06-22 NOTE — Discharge Instructions (Signed)
Take all of medicine, drink lots of fluids, no more smoking, see your doctor if further problems  °

## 2012-06-22 NOTE — ED Provider Notes (Signed)
History     CSN: [id number]  Arrival date & time 06/22/12  0941   First MD Initiated Contact with Patient 06/22/12 [phone]      Chief Complaint  Patient presents with  . Cough    (Consider location/radiation/quality/duration/timing/severity/associated sxs/prior treatment) Patient is a 56 y.o. female presenting with cough. The history is provided by the patient.  Cough This is a new problem. The current episode started more than 2 days ago. The problem has been gradually worsening. The cough is productive of purulent sputum. There has been no fever. Associated symptoms include rhinorrhea and sore throat. Pertinent negatives include no shortness of breath and no wheezing. She is a smoker. Her past medical history is significant for bronchitis.    Past Medical History  Diagnosis Date  . Coronary artery disease     Past Surgical History  Procedure Date  . Coronary artery bypass graft     No family history on file.  History  Substance Use Topics  . Smoking status: Current Everyday Smoker -- 0.5 packs/day  . Smokeless tobacco: Not on file  . Alcohol Use: Yes    OB History    Grav Para Term Preterm Abortions TAB SAB Ect Mult Living                  Review of Systems  Constitutional: Negative.   HENT: Positive for congestion, sore throat, rhinorrhea and postnasal drip.   Respiratory: Positive for cough. Negative for shortness of breath and wheezing.   Gastrointestinal: Negative.   Skin: Negative.     Allergies  Review of patient's allergies indicates no known allergies.  Home Medications   Current Outpatient Rx  Name Route Sig Dispense Refill  . AMOXICILLIN-POT CLAVULANATE 875-125 MG PO TABS Oral Take 1 tablet by mouth 2 (two) times daily. 20 tablet 0  . DEXTROMETHORPHAN POLISTIREX ER 30 MG/5ML PO LQCR Oral Take 10 mLs (60 mg total) by mouth as needed for cough. 89 mL 0    BP 171/86  Pulse 89  Temp 98.7 F (37.1 C) (Oral)  Resp 18  SpO2 94%  Physical Exam    Nursing note and vitals reviewed. Constitutional: She is oriented to person, place, and time. She appears well-developed and well-nourished.  HENT:  Head: Normocephalic.  Right Ear: External ear normal.  Left Ear: External ear normal.  Nose: Mucosal edema and rhinorrhea present.  Mouth/Throat: Oropharynx is clear and moist.  Eyes: Pupils are equal, round, and reactive to light.  Neck: Normal range of motion. Neck supple.  Cardiovascular: Normal rate, regular rhythm and normal heart sounds.   Pulmonary/Chest: Effort normal. She has rhonchi.  Lymphadenopathy:    She has no cervical adenopathy.  Neurological: She is alert and oriented to person, place, and time.  Skin: Skin is warm and dry.  Psychiatric: She has a normal mood and affect.    ED Course  Procedures (including critical care time)  Labs Reviewed - No data to display No results found.   1. Sinusitis acute   2. Bronchitis due to tobacco use       MDM          Linna Hoff, MD 06/22/12 1018

## 2012-06-22 NOTE — ED Notes (Signed)
C/o productive cough of yellow sputum and headache when she awakens every morning.  Denies fever.  Sx for 4-5 days

## 2012-06-29 ENCOUNTER — Emergency Department (INDEPENDENT_AMBULATORY_CARE_PROVIDER_SITE_OTHER): Payer: BC Managed Care – PPO

## 2012-06-29 ENCOUNTER — Emergency Department (INDEPENDENT_AMBULATORY_CARE_PROVIDER_SITE_OTHER)
Admission: EM | Admit: 2012-06-29 | Discharge: 2012-06-29 | Disposition: A | Discharge status: Home / Self Care | Payer: BC Managed Care – PPO | Source: Home / Self Care | Attending: Emergency Medicine | Admitting: Emergency Medicine

## 2012-06-29 ENCOUNTER — Encounter (HOSPITAL_COMMUNITY): Payer: Self-pay

## 2012-06-29 DIAGNOSIS — J329 Chronic sinusitis, unspecified: Secondary | ICD-10-CM

## 2012-06-29 DIAGNOSIS — J410 Simple chronic bronchitis: Secondary | ICD-10-CM

## 2012-06-29 DIAGNOSIS — Z720 Tobacco use: Secondary | ICD-10-CM

## 2012-06-29 DIAGNOSIS — J40 Bronchitis, not specified as acute or chronic: Secondary | ICD-10-CM

## 2012-06-29 HISTORY — DX: Chronic sinusitis, unspecified: J32.9

## 2012-06-29 HISTORY — DX: Bronchitis, not specified as acute or chronic: J40

## 2012-06-29 MED ORDER — GUAIFENESIN-CODEINE 100-10 MG/5ML PO SYRP: 5.0000 mL | ORAL_SOLUTION | Freq: Four times a day (QID) | ORAL | Status: AC | PRN

## 2012-06-29 MED ORDER — IPRATROPIUM BROMIDE 0.02 % IN SOLN
0.5000 mg | Freq: Once | RESPIRATORY_TRACT | Status: AC
  Administered 2012-06-29: 0.5 mg via RESPIRATORY_TRACT

## 2012-06-29 MED ORDER — DEXAMETHASONE 4 MG PO TABS: ORAL_TABLET | ORAL | Status: AC

## 2012-06-29 MED ORDER — DEXAMETHASONE 4 MG PO TABS
ORAL_TABLET | ORAL | Status: AC
  Filled 2012-06-29: qty 3

## 2012-06-29 MED ORDER — DEXAMETHASONE 4 MG PO TABS
16.0000 mg | ORAL_TABLET | Freq: Once | ORAL | Status: AC
  Administered 2012-06-29: 16 mg via ORAL

## 2012-06-29 MED ORDER — ALBUTEROL SULFATE HFA 108 (90 BASE) MCG/ACT IN AERS: 1.0000 | INHALATION_SPRAY | Freq: Four times a day (QID) | RESPIRATORY_TRACT | Status: DC | PRN

## 2012-06-29 MED ORDER — ALBUTEROL SULFATE (5 MG/ML) 0.5% IN NEBU
5.0000 mg | INHALATION_SOLUTION | Freq: Once | RESPIRATORY_TRACT | Status: AC
  Administered 2012-06-29: 5 mg via RESPIRATORY_TRACT

## 2012-06-29 MED ORDER — DEXAMETHASONE 4 MG PO TABS
ORAL_TABLET | ORAL | Status: AC
  Filled 2012-06-29: qty 1

## 2012-06-29 MED ORDER — ALBUTEROL SULFATE (5 MG/ML) 0.5% IN NEBU
INHALATION_SOLUTION | RESPIRATORY_TRACT | Status: AC
  Filled 2012-06-29: qty 1

## 2012-06-29 NOTE — ED Notes (Signed)
Coughing so hard that her head hurts, back hurts; unable to get sputum up and out like she was last week

## 2012-06-29 NOTE — ED Provider Notes (Signed)
History     CSN: [id number]  Arrival date & time 06/29/12  1131   First MD Initiated Contact with Patient 06/29/12 1207      Chief Complaint  Patient presents with  . Cough    (Consider location/radiation/quality/duration/timing/severity/associated sxs/prior treatment) HPI Comments: Patient reports nasal congestion, sinus pressure/pain, coughing, wheezing for the past week. She was evaluated here one week ago, started on Augmentin for 10 days. She states that she is taking this, but reports increasing shortness of breath, unable to sleep at night secondary to coughing. Reports chest and back soreness from all the coughing. No posttussive emesis. No nausea, vomiting, fevers. She is a long-term smoker.  ROS as noted in HPI. All other ROS negative.   Patient is a 56 y.o. female presenting with cough. The history is provided by the patient. No language interpreter was used.  Cough This is a new problem. The current episode started more than 1 week ago. The problem has been gradually worsening. The cough is non-productive. There has been no fever. Associated symptoms include chills, headaches, rhinorrhea, sore throat, shortness of breath and wheezing. Pertinent negatives include no chest pain, no ear congestion, no ear pain and no myalgias. Treatments tried: Augmentin. The treatment provided no relief. She is a smoker. Her past medical history does not include COPD, emphysema or asthma.    Past Medical History  Diagnosis Date  . Coronary artery disease   . Bronchitis   . Sinusitis     Past Surgical History  Procedure Date  . Coronary artery bypass graft     History reviewed. No pertinent family history.  History  Substance Use Topics  . Smoking status: Current Everyday Smoker -- 0.5 packs/day  . Smokeless tobacco: Not on file  . Alcohol Use: Yes    OB History    Grav Para Term Preterm Abortions TAB SAB Ect Mult Living                  Review of Systems  Constitutional:  Positive for chills.  HENT: Positive for sore throat and rhinorrhea. Negative for ear pain.   Respiratory: Positive for cough, shortness of breath and wheezing.   Cardiovascular: Negative for chest pain.  Musculoskeletal: Negative for myalgias.  Neurological: Positive for headaches.    Allergies  Review of patient's allergies indicates no known allergies.  Home Medications   Current Outpatient Rx  Name Route Sig Dispense Refill  . ASPIRIN 500 MG PO TABS Oral Take 500 mg by mouth every 6 (six) hours as needed.    . ALBUTEROL SULFATE HFA 108 (90 BASE) MCG/ACT IN AERS Inhalation Inhale 1-2 puffs into the lungs every 6 (six) hours as needed for wheezing. 1 Inhaler 0  . AMOXICILLIN-POT CLAVULANATE 875-125 MG PO TABS Oral Take 1 tablet by mouth 2 (two) times daily. 20 tablet 0  . DEXAMETHASONE 4 MG PO TABS  4 tabs (16 mg) po at once on day one, 4 tabs (16 mg) po at once on day 2 8 tablet 0  . GUAIFENESIN-CODEINE 100-10 MG/5ML PO SYRP Oral Take 5 mLs by mouth 4 (four) times daily as needed for cough. 120 mL 0    BP 177/91  Pulse 71  Temp 98 F (36.7 C) (Oral)  Resp 24  SpO2 94%  Physical Exam  Nursing note and vitals reviewed. Constitutional: She appears well-developed and well-nourished.  HENT:  Head: Normocephalic and atraumatic.  Right Ear: Tympanic membrane and ear canal normal.  Left Ear: Tympanic membrane  and ear canal normal.  Nose: Mucosal edema and rhinorrhea present. No epistaxis.  Mouth/Throat: Uvula is midline and mucous membranes are normal. Posterior oropharyngeal erythema present. No oropharyngeal exudate.       (+) maxillary sinus tenderness  Eyes: Conjunctivae and EOM are normal.  Neck: Normal range of motion. Neck supple.  Cardiovascular: Normal rate.   Pulmonary/Chest: Effort normal. No respiratory distress. She has wheezes. She has no rales. She exhibits tenderness.       Diffuse tenderness A/P  Abdominal: She exhibits no distension. There is no tenderness.    Musculoskeletal: Normal range of motion.  Lymphadenopathy:    She has no cervical adenopathy.  Neurological: She is alert. Coordination normal.  Skin: Skin is warm and dry. No rash noted.  Psychiatric: She has a normal mood and affect. Her behavior is normal. Judgment and thought content normal.    ED Course  Procedures (including critical care time)  Labs Reviewed - No data to display Dg Chest 2 View  06/29/2012  *RADIOLOGY REPORT*  Clinical Data: Cough, wheezing, shortness of breath  CHEST - 2 VIEW  Comparison: Chest x-ray of 05/07/2011, 10/17/2009, and 09/27/2008  Findings: A nodular opacity in the right lung apex appears represent degenerative change at the right first contour chondral junction and is stable compared to 2009.  Mediastinal contours are stable.   Mild cardiomegaly is stable.  Median sternotomy sutures are noted from prior CABG.  IMPRESSION: No active lung disease.  Stable mild cardiomegaly.   Original Report Authenticated By: Juline Patch, M.D.      1. Bronchitis due to tobacco use   2. Sinusitis     MDM  Patient with poor air movement, faint wheezing. Initial O2 sat 94%. Will give DuoNeb, Decadron 16 mg by mouth for her bronchitis, check x-ray to rule out pneumonia. Will reevaluate.  Imaging reviewed by myself. Report per radiologist.  On reevaluation, patient states she feels much improved. Mild wheezing bilaterally, good air movement. Repeat o2 sat 96%. Currently on Augmentin for sinusitis, bronchitis, will have her continue this, and start her on regular bronchodilators and another day of steroids. Will send her home with cough syrup as well. Followup with PMD of choice as needed. Counseled patient on smoking cessation  Luiz Blare, MD 06/30/12 [phone]

## 2012-09-26 ENCOUNTER — Emergency Department (INDEPENDENT_AMBULATORY_CARE_PROVIDER_SITE_OTHER)
Admission: EM | Admit: 2012-09-26 | Discharge: 2012-09-26 | Disposition: A | Discharge status: Home / Self Care | Payer: BC Managed Care – PPO | Source: Home / Self Care | Attending: Family Medicine | Admitting: Family Medicine

## 2012-09-26 ENCOUNTER — Encounter (HOSPITAL_COMMUNITY): Payer: Self-pay | Admitting: Emergency Medicine

## 2012-09-26 ENCOUNTER — Emergency Department (INDEPENDENT_AMBULATORY_CARE_PROVIDER_SITE_OTHER): Payer: BC Managed Care – PPO

## 2012-09-26 DIAGNOSIS — Z720 Tobacco use: Secondary | ICD-10-CM

## 2012-09-26 DIAGNOSIS — J410 Simple chronic bronchitis: Secondary | ICD-10-CM

## 2012-09-26 DIAGNOSIS — J40 Bronchitis, not specified as acute or chronic: Secondary | ICD-10-CM

## 2012-09-26 LAB — POCT RAPID STREP A: Streptococcus, Group A Screen (Direct): NEGATIVE

## 2012-09-26 MED ORDER — LEVOFLOXACIN 500 MG PO TABS: 500.0000 mg | ORAL_TABLET | Freq: Every day | ORAL | Status: DC

## 2012-09-26 NOTE — ED Provider Notes (Signed)
History     CSN: [id number]  Arrival date & time 09/26/12  0911   First MD Initiated Contact with Patient 09/26/12 0940      Chief Complaint  Patient presents with  . URI    (Consider location/radiation/quality/duration/timing/severity/associated sxs/prior treatment) Patient is a 56 y.o. female presenting with URI. The history is provided by the patient.  URI The primary symptoms include fever, sore throat and cough. Primary symptoms do not include nausea or vomiting. The current episode started 6 to 7 days ago. This is a new problem.  Symptoms associated with the illness include congestion and rhinorrhea. The illness is not associated with chills. Risk factors: recent smoker.    Past Medical History  Diagnosis Date  . Coronary artery disease   . Bronchitis   . Sinusitis     Past Surgical History  Procedure Date  . Coronary artery bypass graft     No family history on file.  History  Substance Use Topics  . Smoking status: Current Every Day Smoker -- 0.5 packs/day  . Smokeless tobacco: Not on file  . Alcohol Use: Yes    OB History    Grav Para Term Preterm Abortions TAB SAB Ect Mult Living                  Review of Systems  Constitutional: Positive for fever. Negative for chills.  HENT: Positive for congestion, sore throat and rhinorrhea.   Respiratory: Positive for cough.   Gastrointestinal: Negative.  Negative for nausea and vomiting.    Allergies  Review of patient's allergies indicates no known allergies.  Home Medications   Current Outpatient Rx  Name  Route  Sig  Dispense  Refill  . ALBUTEROL SULFATE HFA 108 (90 BASE) MCG/ACT IN AERS   Inhalation   Inhale 1-2 puffs into the lungs every 6 (six) hours as needed for wheezing.   1 Inhaler   0   . ASPIRIN 500 MG PO TABS   Oral   Take 500 mg by mouth every 6 (six) hours as needed.           BP 147/83  Pulse 113  Temp 98.5 F (36.9 C) (Oral)  Resp 22  SpO2 91%  Physical Exam  Nursing  note and vitals reviewed. Constitutional: She appears well-developed and well-nourished.  HENT:  Head: Normocephalic.  Right Ear: External ear normal.  Left Ear: External ear normal.  Nose: Nose normal.  Mouth/Throat: Oropharynx is clear and moist.  Eyes: Pupils are equal, round, and reactive to light.  Neck: Normal range of motion. Neck supple.  Cardiovascular: Normal rate, normal heart sounds and intact distal pulses.   Pulmonary/Chest: She has decreased breath sounds. She has wheezes. She has rhonchi. She has no rales.  Lymphadenopathy:    She has no cervical adenopathy.    ED Course  Procedures (including critical care time)   Labs Reviewed  POCT RAPID STREP A ([facility]RG CARE ONLY)   Dg Chest 2 View  09/26/2012  *RADIOLOGY REPORT*  Clinical Data: Cough and fever since 09/22/2012.  CHEST - 2 VIEW  Comparison: PA and lateral chest 05/07/2011 and 06/29/2012.  Findings: Streaky airspace opacity is seen in the lingula.  Lungs otherwise clear.  Heart size normal.  The patient is status post CABG.  IMPRESSION: Streaky lingular opacity could be due to atelectasis or pneumonia.   Original Report Authenticated By: Holley Dexter, M.D.      1. Bronchitis due to tobacco use  MDM  X-rays reviewed and report per radiologist.         Linna Hoff, MD 09/26/12 1041

## 2012-09-26 NOTE — ED Notes (Signed)
Pt c/o cold sx x5 days... Sx include: cough w/green sputum, sore throat, chills, sweating, fevers, loss of gustation.... Denies: vomiting, nauseas, diarrhea.... She is alert w/no signs of acute distress... Has been taken mucinex w/little relief.

## 2013-06-30 ENCOUNTER — Emergency Department (HOSPITAL_COMMUNITY): Payer: BC Managed Care – PPO

## 2013-06-30 ENCOUNTER — Emergency Department (INDEPENDENT_AMBULATORY_CARE_PROVIDER_SITE_OTHER)
Admission: EM | Admit: 2013-06-30 | Discharge: 2013-06-30 | Disposition: A | Discharge status: Nursing Home (Includes ICF) | Payer: BC Managed Care – PPO | Source: Home / Self Care | Attending: Family Medicine | Admitting: Family Medicine

## 2013-06-30 ENCOUNTER — Observation Stay (HOSPITAL_COMMUNITY)
Admission: EM | Admit: 2013-06-30 | Discharge: 2013-07-02 | Disposition: A | Discharge status: Home / Self Care | Payer: BC Managed Care – PPO | Attending: Family Medicine | Admitting: Family Medicine

## 2013-06-30 ENCOUNTER — Encounter (HOSPITAL_COMMUNITY): Payer: Self-pay | Admitting: *Deleted

## 2013-06-30 ENCOUNTER — Encounter (HOSPITAL_COMMUNITY): Payer: Self-pay

## 2013-06-30 DIAGNOSIS — D751 Secondary polycythemia: Secondary | ICD-10-CM | POA: Diagnosis present

## 2013-06-30 DIAGNOSIS — F17200 Nicotine dependence, unspecified, uncomplicated: Secondary | ICD-10-CM | POA: Insufficient documentation

## 2013-06-30 DIAGNOSIS — I2510 Atherosclerotic heart disease of native coronary artery without angina pectoris: Secondary | ICD-10-CM

## 2013-06-30 DIAGNOSIS — I10 Essential (primary) hypertension: Secondary | ICD-10-CM | POA: Insufficient documentation

## 2013-06-30 DIAGNOSIS — Z951 Presence of aortocoronary bypass graft: Secondary | ICD-10-CM | POA: Insufficient documentation

## 2013-06-30 DIAGNOSIS — I209 Angina pectoris, unspecified: Secondary | ICD-10-CM

## 2013-06-30 DIAGNOSIS — R079 Chest pain, unspecified: Secondary | ICD-10-CM

## 2013-06-30 DIAGNOSIS — I161 Hypertensive emergency: Secondary | ICD-10-CM | POA: Diagnosis present

## 2013-06-30 DIAGNOSIS — R519 Headache, unspecified: Secondary | ICD-10-CM | POA: Diagnosis present

## 2013-06-30 DIAGNOSIS — R51 Headache: Secondary | ICD-10-CM

## 2013-06-30 DIAGNOSIS — Z79899 Other long term (current) drug therapy: Secondary | ICD-10-CM | POA: Insufficient documentation

## 2013-06-30 DIAGNOSIS — Z8249 Family history of ischemic heart disease and other diseases of the circulatory system: Secondary | ICD-10-CM | POA: Insufficient documentation

## 2013-06-30 DIAGNOSIS — D45 Polycythemia vera: Secondary | ICD-10-CM | POA: Insufficient documentation

## 2013-06-30 HISTORY — DX: Essential (primary) hypertension: I10

## 2013-06-30 LAB — BASIC METABOLIC PANEL
Calcium: 10.1 mg/dL (ref 8.4–10.5)
GFR calc Af Amer: >90 mL/min (ref >90)
GFR calc non Af Amer: >90 mL/min (ref >90)
Glucose, Bld: 100 mg/dL — ABNORMAL HIGH (ref 70–99)
Potassium: 3.8 mEq/L (ref 3.5–5.1)
Sodium: 135 mEq/L (ref 135–145)

## 2013-06-30 LAB — CBC
Hemoglobin: 19.8 g/dL — ABNORMAL HIGH (ref 12.0–15.0)
MCH: 32.6 pg (ref 26.0–34.0)
MCHC: 35.6 g/dL (ref 30.0–36.0)
Platelets: 166 10*3/uL (ref 150–400)

## 2013-06-30 LAB — POCT I-STAT TROPONIN I: Troponin i, poc: 0.03 ng/mL (ref 0.00–0.08)

## 2013-06-30 LAB — PRO B NATRIURETIC PEPTIDE: Pro B Natriuretic peptide (BNP): 192.3 pg/mL — ABNORMAL HIGH (ref 0–125)

## 2013-06-30 MED ORDER — MORPHINE SULFATE 2 MG/ML IJ SOLN: 2.0000 mg | INTRAMUSCULAR | Status: DC | PRN

## 2013-06-30 MED ORDER — MORPHINE SULFATE 2 MG/ML IJ SOLN
1.0000 mg | Freq: Once | INTRAMUSCULAR | Status: DC
  Filled 2013-06-30: qty 1

## 2013-06-30 MED ORDER — ASPIRIN EC 325 MG PO TBEC
325.0000 mg | DELAYED_RELEASE_TABLET | Freq: Every day | ORAL | Status: DC
  Administered 2013-06-30 – 2013-07-02 (×3): 325 mg via ORAL
  Filled 2013-06-30 (×3): qty 1

## 2013-06-30 MED ORDER — ENOXAPARIN SODIUM 40 MG/0.4ML ~~LOC~~ SOLN
40.0000 mg | SUBCUTANEOUS | Status: DC
  Administered 2013-06-30 – 2013-07-01 (×2): 40 mg via SUBCUTANEOUS
  Filled 2013-06-30 (×3): qty 0.4

## 2013-06-30 MED ORDER — ASPIRIN 81 MG PO CHEW
324.0000 mg | CHEWABLE_TABLET | Freq: Once | ORAL | Status: AC
  Administered 2013-06-30: 324 mg via ORAL
  Filled 2013-06-30: qty 4

## 2013-06-30 MED ORDER — SODIUM CHLORIDE 0.9 % IV SOLN
INTRAVENOUS | Status: DC
  Administered 2013-06-30 – 2013-07-01 (×2): via INTRAVENOUS

## 2013-06-30 MED ORDER — GI COCKTAIL ~~LOC~~
ORAL | Status: AC
  Filled 2013-06-30: qty 30

## 2013-06-30 MED ORDER — ACETAMINOPHEN 325 MG PO TABS
650.0000 mg | ORAL_TABLET | ORAL | Status: DC | PRN
  Administered 2013-07-01 – 2013-07-02 (×3): 650 mg via ORAL
  Filled 2013-06-30 (×2): qty 2

## 2013-06-30 MED ORDER — METOPROLOL TARTRATE 1 MG/ML IV SOLN
5.0000 mg | Freq: Four times a day (QID) | INTRAVENOUS | Status: DC
  Administered 2013-06-30 – 2013-07-01 (×2): 5 mg via INTRAVENOUS
  Filled 2013-06-30 (×6): qty 5

## 2013-06-30 MED ORDER — ONDANSETRON HCL 4 MG/2ML IJ SOLN: 4.0000 mg | Freq: Four times a day (QID) | INTRAMUSCULAR | Status: DC | PRN

## 2013-06-30 MED ORDER — GI COCKTAIL ~~LOC~~
30.0000 mL | Freq: Once | ORAL | Status: AC
  Administered 2013-06-30: 30 mL via ORAL

## 2013-06-30 NOTE — ED Notes (Signed)
MD at bedside. 

## 2013-06-30 NOTE — ED Notes (Signed)
Pt c/o intermittent mid-sternum chest pain, SOB, numbness and tingling to bilateral arm, and GERD x4 weeks

## 2013-06-30 NOTE — ED Notes (Signed)
Pt sent over from urgent care for further evaluation of chest pressure. Pt reports that she has been having intermittent chest pain/pressure and sob with laying down X 2 weeks. sts when she gets the pain if she is still then the pain goes away. Pt has hx of bypass sx and hospitalization for CP in past. Denies HA/tingling of any extremities. Pt in nad, skin warm and dry, resp e/u.

## 2013-06-30 NOTE — ED Notes (Signed)
Report to [facility] on 4E.  Pt transported to floor on monitor with EMT.

## 2013-06-30 NOTE — ED Notes (Signed)
Pt returned from radiology.

## 2013-06-30 NOTE — ED Notes (Signed)
Pt declined morphine, carpuject given to pharmacist to waste.

## 2013-06-30 NOTE — ED Provider Notes (Signed)
CSN: [id number]     Arrival date & time 06/30/13  1524 History   First MD Initiated Contact with Patient 06/30/13 1613     Chief Complaint  Patient presents with  . Chest Pain   HPI Comments: Pt is a 57 y/o female with PMHx of CABG in 2003, current smoker, polycythemia of unknown etiology, GERD who presents to the ED with hypertensive urgency and ongoing intermittent substernal CP.  Pt states that around two weeks ago she developed intermittent frontal HA's, severe enough to wake her up in the middle of the night that are relieved by sitting up or taking tylenol.  Has noticed some orthopnea and PND over the last two weeks as well but unsure of how long this has been ongoing.  As well, she developed intermittent substernal chest pressure that gets worse with activity along with dyspnea on exertion with less than 1 block activity, relieved completely by rest.  This has been ongoing for the past two weeks on and off with activity.  Pt went to the health grocery store today and had her BP taken at that time and was significantly high and told to go to the urgent care.  At that time, she proceeded to go to the urgent care and her BP was 191/72 and sent to the ED.  Pt states when she has her HA, she does notice she has this substernal chest pressure and nausea at that time.  Does notice radiation into both arms when she has her chest pressure but denies any type of jaw pain radiation or diaphoresis during these episodes.  She does smoke 1/2-1 ppd for the last 40 yrs, family history includes mom with CABG in her early 35's, and does take ASA daily.  Has been hospitalized for similar episode around 1 yr ago at which time she was r/o for cardiac event and dx with GERD.  As well has history of some dyspnea in the past that she has taken an albuterol inhaler for, relieving her SOB.  Denies any fever, chills, sweats, diplopia, blurred vision, tinnitus, weakness, vomiting, diarrhea, lower extremity edema.    Patient is a  57 y.o. female presenting with chest pain.  Chest Pain Associated symptoms: headache, nausea and shortness of breath   Associated symptoms: no abdominal pain, no cough, no diaphoresis, no dizziness, no fatigue, no fever, no numbness, not vomiting and no weakness     Past Medical History  Diagnosis Date  . Coronary artery disease   . Bronchitis   . Sinusitis   . Hypertension    Past Surgical History  Procedure Laterality Date  . Coronary artery bypass graft     History reviewed. No pertinent family history. History  Substance Use Topics  . Smoking status: Current Every Day Smoker -- 0.50 packs/day  . Smokeless tobacco: Not on file  . Alcohol Use: Yes   OB History   Grav Para Term Preterm Abortions TAB SAB Ect Mult Living                 Review of Systems  Constitutional: Negative for fever, chills, diaphoresis, activity change, fatigue and unexpected weight change.  HENT: Negative for neck stiffness and tinnitus.   Eyes: Negative.  Negative for photophobia.  Respiratory: Positive for chest tightness and shortness of breath. Negative for cough, choking and wheezing.   Cardiovascular: Positive for chest pain.  Gastrointestinal: Positive for nausea. Negative for vomiting, abdominal pain, diarrhea and constipation.  Endocrine: Negative.   Genitourinary: Negative.  Musculoskeletal: Negative.   Skin: Negative.   Allergic/Immunologic: Negative.   Neurological: Positive for headaches. Negative for dizziness, seizures, speech difficulty, weakness, light-headedness and numbness.  Hematological: Negative.   Psychiatric/Behavioral: Negative.     Allergies  Review of patient's allergies indicates no known allergies.  Home Medications   Current Outpatient Rx  Name  Route  Sig  Dispense  Refill  . aspirin 500 MG tablet   Oral   Take 500 mg by mouth every 6 (six) hours as needed for pain.           BP 191/91  Pulse 67  Temp(Src) 98.1 F (36.7 C) (Oral)  Resp 17  SpO2  96% Physical Exam  Constitutional: She is oriented to person, place, and time. She appears well-developed and well-nourished. She is active. No distress.  HENT:  Head: Normocephalic and atraumatic.  Neck: Normal range of motion. Normal carotid pulses, no hepatojugular reflux and no JVD present. Carotid bruit is not present.  Cardiovascular: Normal rate, regular rhythm, normal heart sounds, intact distal pulses and normal pulses.   No murmur heard. Pulmonary/Chest: Effort normal and breath sounds normal.  Abdominal: Normal appearance and bowel sounds are normal. There is no tenderness.  Neurological: She is alert and oriented to person, place, and time. She has normal reflexes. No cranial nerve deficit.  Skin: Skin is warm and intact.    ED Course  Procedures (including critical care time)  Date: 06/30/2013  Rate: 70  Rhythm: normal sinus rhythm  QRS Axis: normal  Intervals: normal  ST/T Wave abnormalities: ST depression II, III, AvF  Conduction Disutrbances: none  Narrative Interpretation: ST depression II, III, AvF  Old EKG Reviewed: No significant changes noted  Labs Review Labs Reviewed  CBC - Abnormal; Notable for the following:    RBC 6.07 (*)    Hemoglobin 19.8 (*)    HCT 55.6 (*)    All other components within normal limits  BASIC METABOLIC PANEL - Abnormal; Notable for the following:    Glucose, Bld 100 (*)    Creatinine, Ser 0.47 (*)    All other components within normal limits  PRO B NATRIURETIC PEPTIDE - Abnormal; Notable for the following:    Pro B Natriuretic peptide (BNP) 192.3 (*)    All other components within normal limits  POCT I-STAT TROPONIN I   Imaging Review Dg Chest 2 View  06/30/2013   CLINICAL DATA:  Chest pain, shortness of Breath.  EXAM: CHEST  2 VIEW  COMPARISON:  09/26/2012  FINDINGS: Prior CABG. Heart is upper limits normal in size. No confluent opacities or effusions. No acute bony abnormality.  IMPRESSION: No active disease.   Electronically  Signed   By: Charlett Nose M.D.   On: 06/30/2013 18:10   MDM   1. Typical angina   Pt with significant risk factors for cardiac origin including prior CABG, HTN urgency (BP > 180/110), current smoker, and family history of CAD.  As well, she is having typical angina, intermittent stable angina, that improves with rest.  Last myoview was in 2012 with EF 62% and no stress ischemia but some septal wall hypokinesis.  She has seen Dr. Anne Fu, [facility] cardiology in the past for previous hospitalizations.  Will get POC troponin, CBC, BMP, BNP, CXR, and EKG to evaluate and reassess as well as ASA 324 mg and dose of morphine.  In regards to her BP, will consider dose of labetalol.    5:43 PM - EKG showing slight ST depression  in II, III, AvF which is similar to previous.  POC troponin negative, CBC showing recurrent polycythemia possibly related to her smoking.    6:27 PM - Spoke with on call cardiologist, Dr. Anne Fu, who recommended due to her BP to discuss admission through hospitalist service.    6:39 PM - Spoke with on call physician for family medicine, Dr. Gayla Doss, who will come evaluate for admission.   Twana First Paulina Fusi, DO of [facility] 06/30/2013, 6:50 PM    Briscoe Deutscher, DO 06/30/13 1850

## 2013-06-30 NOTE — ED Notes (Signed)
Pt  Reports  A  History  Of  gerd        She  Reports  A  Sensation of  Burning  /  Tightness  In  Chest  For  About  4  Weeks  -  She  Says  She  Has  An appt  Next  Week at the  [facility]  She reports  A history of  htn  -  Takes  No  meds

## 2013-06-30 NOTE — ED Notes (Signed)
Per Janet [place] NT Dr. Effie Shy does not feel the need for an second EKG

## 2013-06-30 NOTE — ED Provider Notes (Addendum)
CSN: [id number]     Arrival date & time 06/30/13  1336 History   First MD Initiated Contact with Patient 06/30/13 1445     Chief Complaint  Patient presents with  . Abdominal Pain   (Consider location/radiation/quality/duration/timing/severity/associated sxs/prior Treatment) Patient is a 57 y.o. female presenting with abdominal pain and chest pain. The history is provided by the patient.  Abdominal Pain This is a new problem. The current episode started more than 1 week ago (off and on for 4 wks.). The problem has not changed since onset.Associated symptoms include chest pain and abdominal pain.  Chest Pain Pain location:  Substernal area Pain quality: tightness   Pain radiates to:  Mid back Pain radiates to the back: yes   Pain severity:  Moderate Onset quality:  Gradual Duration:  4 weeks Timing:  Intermittent Progression:  Waxing and waning Chronicity:  Recurrent Context comment:  Told today at health food store that bp was very elevated. Associated symptoms: abdominal pain and heartburn   Associated symptoms: no lower extremity edema, no nausea, no palpitations and not vomiting   Risk factors: coronary artery disease, hypertension and smoking     Past Medical History  Diagnosis Date  . Coronary artery disease   . Bronchitis   . Sinusitis   . Hypertension    Past Surgical History  Procedure Laterality Date  . Coronary artery bypass graft     History reviewed. No pertinent family history. History  Substance Use Topics  . Smoking status: Current Every Day Smoker -- 0.50 packs/day  . Smokeless tobacco: Not on file  . Alcohol Use: Yes   OB History   Grav Para Term Preterm Abortions TAB SAB Ect Mult Living                 Review of Systems  Respiratory: Positive for chest tightness.   Cardiovascular: Positive for chest pain. Negative for palpitations and leg swelling.  Gastrointestinal: Positive for heartburn and abdominal pain. Negative for nausea and vomiting.   Skin: Negative.     Allergies  Review of patient's allergies indicates no known allergies.  Home Medications   Current Outpatient Rx  Name  Route  Sig  Dispense  Refill  . EXPIRED: albuterol (PROVENTIL HFA;VENTOLIN HFA) 108 (90 BASE) MCG/ACT inhaler   Inhalation   Inhale 1-2 puffs into the lungs every 6 (six) hours as needed for wheezing.   1 Inhaler   0   . aspirin 500 MG tablet   Oral   Take 500 mg by mouth every 6 (six) hours as needed.         Marland Kitchen levofloxacin (LEVAQUIN) 500 MG tablet   Oral   Take 1 tablet (500 mg total) by mouth daily.   10 tablet   0    BP 191/86  Pulse 65  Temp(Src) 97 F (36.1 C) (Oral)  Resp 19  SpO2 95% Physical Exam  Nursing note and vitals reviewed. Constitutional: She is oriented to person, place, and time. She appears well-developed and well-nourished. No distress.  HENT:  Head: Normocephalic.  Mouth/Throat: Oropharynx is clear and moist.  Eyes: Conjunctivae are normal. Pupils are equal, round, and reactive to light.  Neck: Normal range of motion. Neck supple.  Cardiovascular: Normal rate, regular rhythm, normal heart sounds and intact distal pulses.   Pulmonary/Chest: Effort normal and breath sounds normal.  Abdominal: Soft. Bowel sounds are normal. She exhibits no distension and no mass. There is no tenderness. There is no rebound and no  guarding.  Lymphadenopathy:    She has no cervical adenopathy.  Neurological: She is alert and oriented to person, place, and time.  Skin: Skin is warm and dry.    ED Course  Procedures (including critical care time) Labs Review Labs Reviewed - No data to display Imaging Review No results found.  MDM  ecg--no acute changes, given gi cocktail  Sent for eval of cp , possibly angina---hbp untreated, smoker,     Linna Hoff, MD 06/30/13 1518  Linna Hoff, MD 06/30/13 [phone]

## 2013-06-30 NOTE — ED Notes (Signed)
Pt refused IV  

## 2013-06-30 NOTE — ED Notes (Signed)
Pt never wanted morphine, never opened package. Unopened morphine package given to [employer], Teacher, early years/pre.

## 2013-06-30 NOTE — ED Provider Notes (Signed)
57 year old female, history of coronary artery bypass grafting as well as hypertension, tobacco abuse. She presents with intermittent chest pains which are exertional, heavy, substernal and seemed to get better when she rests. At this time she has no significant symptoms, she was sent from the urgent care for further evaluation. She has clear lungs, clear heart sounds, soft abdomen and no peripheral edema. Her EKG is overall not changed significantly however there are some nonspecific ST abnormalities in the inferior leads. She is significantly hypertensive, it is improved since being at the urgent care but she will require a cardiology consultation and possible admission to the hospital.  I saw and evaluated the patient, reviewed the resident's note and I agree with the findings and plan.  I have also interpreted the ECG and agree with the interpretation of the resident.   Vida Roller, MD 07/02/13 [phone]

## 2013-06-30 NOTE — ED Notes (Signed)
Attempted to call report to [facility].  Unable to take report at this time.  Will call me back.

## 2013-06-30 NOTE — H&P (Signed)
Family Medicine Teaching [facility] Admission History and Physical Service Pager: [phone]  Patient name: [name] Medical record number: [id number] Date of birth: 06/03/1956 Age: 57 y.o. Gender: female  Primary Care Provider: No PCP Per Patient Consultants: [facility] Code Status: Full  Chief Complaint: Headache and Chest Pain  Assessment and Plan: MARIEN Fleming is a 57 y.o. female presenting with chest pain, HA, and hypertensive emergency . PMH is significant for CABG in 2003, current smoker, polycythemia of unknown etiology, GERD.  # Chest Pain - Pt with significant risk factors for cardiac origin including prior CABG 2003, HTN urgency (BP > 180/110), current smoker, and family history of CAD. As well, she is having typical angina, intermittent stable angina, that improves with rest. Last myoview was in 2012 with EF 62% and no stress ischemia but some septal wall hypokinesis. She has seen Dr. Anne Fu, [facility] cardiology in the past for previous hospitalizations. Will admit to tele for MI rule out and risk stratification - EKG(admit): NSR, ST depression II, III, AvF. Old EKG Reviewed: No significant changes noted  - Repeat EKG tomorrow - Trop x 3: POCT trop: neg - Cards Consulted: Dr. Anne Fu, [facility]  - Considering ECHO; will discuss with Cards - ASA - proBNP 193 - Likely need BB, ACE at discharge - Risk strtification labs in teh AM: Lipid panel, A1C, TSH   # Hypertensive Emergency - BP (admit) 190/90  - IV Metoprolol 5mg  QID  - BMP: wnl (except Gluc 100); Cr 0.47 - will add Ace next, goal to lower BP no more than 25%  # HA - Headaches x 3 wks, wakes her up at night, better with sitting up concerning for increased intracranial pressure. No wt changes, has fatigue and night sweats for years (relates to menopause). No CN palsies, Sharp optic disk - Will treat HTN and reassess - Considering brain imaging if not improved with BP control   # Polycythemia  (Unknown etiology)  - Slowing increasing since 2010. Likely secondary to hypoxia due to pulmonary disease - H&H: 19.8 & 55.6  - Consider EPO level; will discuss with team tomorrow  FEN/GI: Heart healthy diet, NPO after midnight for stress test in the am  IVF: 100 cc/hr Prophylaxis: Lovenox  Disposition: Stable  History of Present Illness: [name] is a 57 y.o. female presenting with hypertensive urgency and ongoing intermittent substernal CP and fatigue. PMHx of CABG in 2003, current smoker, polycythemia of unknown etiology, GERD.  Pt states that around 3 weeks ago she developed intermittent frontal HA's, that occur every night (sometimes twice) and are severe enough to wake her up in the middle of the night. They are relieved by sitting up, and occasionally she takes tylenol. She reports some night sweats that have been going on for years (she associates them with menopause), but denies recent weight loss. She also reports intermittent substernal chest pressure "squezzing" with radiation to both arms and numbness in fingers that gets worse with activity. She get some difficultly swollowing with these episodes but denies diaphoresis.  She can get dyspnea on exertion (walking up stairs) relieved completely by rest. The SOB has occurred in the past, and she uses an albuterol inhaler as needed (No MDI x 6 mos).  Pt went to health food store today to get something for HAs, had BP checked and was told BP was significantly high. She was sent to urgent care where BP was 191/72 and sent to the ED.  Has been hospitalized  for similar episode around 1 yr ago at which time she was r/o for cardiac event and dx with GERD. Denies any fever, chills, diplopia, vomiting, diarrhea, lower extremity edema. She smokes 1/2-1 ppd for the last 40 yrs, family history includes mom with CABG in her early 36's, and does take ASA daily.   2003 CABG Hx: Was in her yard and feeling extremely weak, taken to hospital and and 3  vessel bypass. Was on no medications prior to this, and did not have any CP. She was put on "some meds after CABG, but slowly taken off b/c she didn't need them." Has been off medications for a while and hasn't been seen by physician since 2011. Of note she does not have the same "weak" feeling she did with her first MI but has had general weakness and fatigue for several months.   ED Course:  EKG: NSR, ST depression II, III, AvF. Old EKG Reviewed: No significant changes noted Labs: H&H: 19.8 & 55.6; Cr 0.47; proBNP 192; POCT  CXR: Heart is upper limits normal in size. IMPRESSION: No active disease.  Review Of Systems: Per HPI with the following additions:  Otherwise 12 point review of systems was performed and was unremarkable.  There are no active problems to display for this patient.  Past Medical History: Past Medical History  Diagnosis Date  . Coronary artery disease   . Bronchitis   . Sinusitis   . Hypertension    Past Surgical History: Past Surgical History  Procedure Laterality Date  . Coronary artery bypass graft  2003    Triple bypass   Social History: History  Substance Use Topics  . Smoking status: Current Every Day Smoker -- 0.50 packs/day  . Smokeless tobacco: Not on file  . Alcohol Use: Yes   Additional social history:   Please also refer to relevant sections of EMR.  Family History: History reviewed. No pertinent family history. Allergies and Medications: No Known Allergies No current facility-administered medications on file prior to encounter.   Current Outpatient Prescriptions on File Prior to Encounter  Medication Sig Dispense Refill  . aspirin 500 MG tablet Take 500 mg by mouth every 6 (six) hours as needed for pain.        Objective: BP 163/85  Pulse 62  Temp(Src) 98.1 F (36.7 C) (Oral)  Resp 15  SpO2 97% Exam: Gen: WD/WN in NAD Neuro: A&Ox4; CN 2-12 intact; Short & Long term memory intact; Gross Sensory & Motor intact Head:  Normocephalic/Atraumatic; Scalp w/o lesions Eyes:Sclera white; Conjunctiva pink; PERRLA; EOMI; Couple beats of nystagmus on lateral gaze; Opthlamic exam revealed sharp disc margins, no hemorrages, no pappiledema Throat: Oral mucosa pink, Pharynx w/o exudates CV: RRR, No m/r/g; No Carotid bruits; No JVD; No lower ext. edema Lungs: CTAB; Lungs resonant GI: BS + ; No tenderness or masses Ext: 2+DP pulses, no LE edema  Labs and Imaging: Results for orders placed during the hospital encounter of 06/30/13 (from the past 24 hour(s))  CBC     Status: Abnormal   Collection Time    06/30/13  4:40 PM      Result Value Range   WBC 9.6  4.0 - 10.5 K/uL   RBC 6.07 (*) 3.87 - 5.11 MIL/uL   Hemoglobin 19.8 (*) 12.0 - 15.0 g/dL   HCT 11.9 (*) 14.7 - 82.9 %   MCV 91.6  78.0 - 100.0 fL   MCH 32.6  26.0 - 34.0 pg   MCHC 35.6  30.0 -  36.0 g/dL   RDW 14.7  82.9 - 56.2 %   Platelets 166  150 - 400 K/uL  BASIC METABOLIC PANEL     Status: Abnormal   Collection Time    06/30/13  4:40 PM      Result Value Range   Sodium 135  135 - 145 mEq/L   Potassium 3.8  3.5 - 5.1 mEq/L   Chloride 96  96 - 112 mEq/L   CO2 30  19 - 32 mEq/L   Glucose, Bld 100 (*) 70 - 99 mg/dL   BUN 11  6 - 23 mg/dL   Creatinine, Ser 1.30 (*) 0.50 - 1.10 mg/dL   Calcium 86.5  8.4 - 78.4 mg/dL   GFR calc non Af Amer >90  >90 mL/min   GFR calc Af Amer >90  >90 mL/min  PRO B NATRIURETIC PEPTIDE     Status: Abnormal   Collection Time    06/30/13  4:40 PM      Result Value Range   Pro B Natriuretic peptide (BNP) 192.3 (*) 0 - 125 pg/mL  POCT I-STAT TROPONIN I     Status: None   Collection Time    06/30/13  4:54 PM      Result Value Range   Troponin i, poc 0.03  0.00 - 0.08 ng/mL   Comment 3            CXR: Heart is upper limits normal in size. No active disease.  Wenda Low, MD 06/30/2013, 7:16 PM PGY-1, [facility] FPTS Intern pager: [phone], text pages welcome  I have seen and examine the patient with  Dr. Gayla Doss. I agree with his note above, my edits are in [place].   Murtis Sink, MD [facility] Family [facility] Resident, PGY-2 FPTS Intern pager: [phone], text pages welcome 06/30/2013, 10:26 PM

## 2013-07-01 ENCOUNTER — Observation Stay (HOSPITAL_COMMUNITY): Payer: BC Managed Care – PPO

## 2013-07-01 DIAGNOSIS — I209 Angina pectoris, unspecified: Secondary | ICD-10-CM

## 2013-07-01 DIAGNOSIS — D45 Polycythemia vera: Secondary | ICD-10-CM

## 2013-07-01 DIAGNOSIS — I161 Hypertensive emergency: Secondary | ICD-10-CM | POA: Diagnosis present

## 2013-07-01 DIAGNOSIS — R519 Headache, unspecified: Secondary | ICD-10-CM | POA: Diagnosis present

## 2013-07-01 DIAGNOSIS — R51 Headache: Secondary | ICD-10-CM

## 2013-07-01 DIAGNOSIS — R079 Chest pain, unspecified: Secondary | ICD-10-CM | POA: Diagnosis present

## 2013-07-01 DIAGNOSIS — D751 Secondary polycythemia: Secondary | ICD-10-CM | POA: Diagnosis present

## 2013-07-01 DIAGNOSIS — I10 Essential (primary) hypertension: Secondary | ICD-10-CM

## 2013-07-01 DIAGNOSIS — F17200 Nicotine dependence, unspecified, uncomplicated: Secondary | ICD-10-CM

## 2013-07-01 HISTORY — DX: Hypertensive emergency: I16.1

## 2013-07-01 LAB — CBC
MCH: 30.5 pg (ref 26.0–34.0)
MCV: 92 fL (ref 78.0–100.0)
Platelets: 148 10*3/uL — ABNORMAL LOW (ref 150–400)
RBC: 5.48 MIL/uL — ABNORMAL HIGH (ref 3.87–5.11)
RDW: 13.4 % (ref 11.5–15.5)
WBC: 7 10*3/uL (ref 4.0–10.5)

## 2013-07-01 LAB — TROPONIN I: Troponin I: <0.3 ng/mL (ref <0.30)

## 2013-07-01 LAB — BASIC METABOLIC PANEL
BUN: 12 mg/dL (ref 6–23)
CO2: 28 mEq/L (ref 19–32)
Calcium: 8.9 mg/dL (ref 8.4–10.5)
Creatinine, Ser: 0.52 mg/dL (ref 0.50–1.10)
GFR calc non Af Amer: >90 mL/min (ref >90)
Glucose, Bld: 105 mg/dL — ABNORMAL HIGH (ref 70–99)
Sodium: 139 mEq/L (ref 135–145)

## 2013-07-01 LAB — HEMOGLOBIN A1C: Hgb A1c MFr Bld: 5.8 % — ABNORMAL HIGH (ref <5.7)

## 2013-07-01 LAB — LIPID PANEL
HDL: 25 mg/dL — ABNORMAL LOW (ref >39)
LDL Cholesterol: 157 mg/dL — ABNORMAL HIGH (ref 0–99)
Total CHOL/HDL Ratio: 9.4 RATIO
Triglycerides: 262 mg/dL — ABNORMAL HIGH (ref <150)
VLDL: 52 mg/dL — ABNORMAL HIGH (ref 0–40)

## 2013-07-01 MED ORDER — ATORVASTATIN CALCIUM 40 MG PO TABS
40.0000 mg | ORAL_TABLET | Freq: Every day | ORAL | Status: DC
  Administered 2013-07-01: 40 mg via ORAL
  Filled 2013-07-01 (×2): qty 1

## 2013-07-01 MED ORDER — ISOSORBIDE MONONITRATE ER 60 MG PO TB24
60.0000 mg | ORAL_TABLET | Freq: Every day | ORAL | Status: DC
  Administered 2013-07-01 – 2013-07-02 (×2): 60 mg via ORAL
  Filled 2013-07-01 (×2): qty 1

## 2013-07-01 MED ORDER — ACETAMINOPHEN 325 MG PO TABS
ORAL_TABLET | ORAL | Status: AC
  Filled 2013-07-01: qty 2

## 2013-07-01 MED ORDER — TECHNETIUM TC 99M SESTAMIBI GENERIC - CARDIOLITE
30.0000 | Freq: Once | INTRAVENOUS | Status: AC | PRN
  Administered 2013-07-01: 30 via INTRAVENOUS

## 2013-07-01 MED ORDER — CARVEDILOL 6.25 MG PO TABS
6.2500 mg | ORAL_TABLET | Freq: Two times a day (BID) | ORAL | Status: DC
  Administered 2013-07-01 – 2013-07-02 (×2): 6.25 mg via ORAL
  Filled 2013-07-01 (×4): qty 1

## 2013-07-01 MED ORDER — TECHNETIUM TC 99M SESTAMIBI GENERIC - CARDIOLITE
10.0000 | Freq: Once | INTRAVENOUS | Status: AC | PRN
  Administered 2013-07-01: 10 via INTRAVENOUS

## 2013-07-01 MED ORDER — REGADENOSON 0.4 MG/5ML IV SOLN
0.4000 mg | Freq: Once | INTRAVENOUS | Status: AC
  Administered 2013-07-01: 0.4 mg via INTRAVENOUS

## 2013-07-01 MED ORDER — REGADENOSON 0.4 MG/5ML IV SOLN
INTRAVENOUS | Status: AC
  Administered 2013-07-01: 0.4 mg via INTRAVENOUS
  Filled 2013-07-01: qty 5

## 2013-07-01 NOTE — H&P (Signed)
FMTS Attending Admission Note: Rhonda Iannuzzi,MD I  have seen and examined this patient, reviewed their chart. I have discussed this patient with the resident. I agree with the resident's findings, assessment and care plan.  57 Y/O Lady with PMX of MI 11 yrs ago,HTN who presented to the ED with chest pain that started yesterday,centrally located,aching in nature,associated with nausea but no vomiting,no diaphoresis,she also had headache yesterday,this has been on going on and off for weeks,she denies any vision loss,headache improves with rest. Patient has been off all antihypertensive medication for over 3 yrs. This morning,she feels better,no chest pain or headache.  Filed Vitals:   06/30/13 2030 06/30/13 2119 06/30/13 2251 07/01/13 0457  BP: 177/71 193/94 134/83 122/49  Pulse: 64 66 63 59  Temp:  98.3 F (36.8 C)  97.9 F (36.6 C)  TempSrc:  Oral  Oral  Resp: 23 19  19   Height:  5\' 1"  (1.549 m)    Weight:  158 lb 14.4 oz (72.077 kg)  159 lb 9.6 oz (72.394 kg)  SpO2: 96% 94%  96%   Exam: Gen: Awake and alert,not in distress. Neuro: No sign of meningeal irritation,DTR and CN grossly intact. HEENT: EOMI,PERRLA. Resp: Air entry equal B/L,mild wheezing more on the right middle lobe which resolved with coughing. Heart: S1 S2 normal,no murmur. Abd: benign. Ext; No edema.  A/P; 57 Y/O F with; 1. Chest pain, R/O ACS     EKG reviewed,no acute ischemic changes.     TNI Neg X 3.     I Agree with starting on Beta-blocker,ASA and risk stratification lab.     Stress test today/ECHO.     Cardiology consult.  2. HTN emergency.     Started on Metoprolol.     BP improved.  3. Headache: Likely related to her elevated BP.     Headache resolved now.     No signs of meningeal irritation or neurologic deficit     Control BP.     Analgesic prn head.     CT head if persistent recommended.

## 2013-07-01 NOTE — Progress Notes (Signed)
Admitted pt to rm 4E17 from ED, oriented to room, callbell placed within reach, placed on heart monitor showing NSR. Pt denied pain at this time, admission assessment done, orders carried out. Will continue to monitor.

## 2013-07-01 NOTE — Progress Notes (Addendum)
The nuclear stress shows inferior ischemia. Spoke with Dr. Anne Fu and decision is to intensify med therapy and if persistent complaints, cath may be necessary. Possible home in AM if all goes well.

## 2013-07-01 NOTE — Consult Note (Signed)
Admit date: 06/30/2013 Referring Physician  Dr. Paulina Fusi Primary Physician No PCP Per Patient Primary Cardiologist  Dr. Anne Fu Reason for Consultation  Chest apin  HPI: 57 year old with CAD, post CABG 2003, smoker, HA, uncontrolled HTN, Family history of CAD, here with 2 week history of chest pain intermittent concerning for possible unstable angina. Has polycythemia as well.  Has been having intermittent SSCP, fatigue, HA frontal nightly severe enough to wake her up, night sweats (for years). Squeezing in chest with radiation to both arms, numbness in fingers with activity/ worse with activity. However, can also occur at rest.  DOE with stairs.   Came from Urgent care after checking BP in grocery store. Elevated.   Currently comfortable in bed, CP free. Spoke with FP attending.    PMH:   Past Medical History  Diagnosis Date  . Coronary artery disease   . Bronchitis   . Sinusitis   . Hypertension     PSH:   Past Surgical History  Procedure Laterality Date  . Coronary artery bypass graft  2003    Triple bypass   Allergies:  Review of patient's allergies indicates no known allergies. Prior to Admit Meds:   Prescriptions prior to admission  Medication Sig Dispense Refill  . aspirin 500 MG tablet Take 500 mg by mouth every 6 (six) hours as needed for pain.        Fam HX:    Family History  Problem Relation Age of Onset  . Heart Problems Mother 37   Social HX:    History   Social History  . Marital Status: Single    Spouse Name: N/A    Number of Children: N/A  . Years of Education: N/A   Occupational History  . Not on file.   Social History Main Topics  . Smoking status: Current Every Day Smoker -- 0.50 packs/day  . Smokeless tobacco: Not on file  . Alcohol Use: Yes     Comment: occasional  . Drug Use: No  . Sexual Activity: Not Currently   Other Topics Concern  . Not on file   Social History Narrative  . No narrative on file     ROS: No syncope, no bleeding,  no CVA, no visual changes. +CP, fatigue, SOB, fatigue as above. All 11 ROS were addressed and are negative except what is stated in the HPI  Physical Exam: Blood pressure 122/49, pulse 59, temperature 97.9 F (36.6 C), temperature source Oral, resp. rate 19, height 5\' 1"  (1.549 m), weight 72.394 kg (159 lb 9.6 oz), SpO2 96.00%.    General: Well developed, well nourished, in no acute distress Head: Eyes PERRLA, No xanthomas.   Normal cephalic and atramatic  Lungs:  Clear bilaterally to auscultation and percussion. Normal respiratory effort. No wheezes, no rales. Heart:   HRRR S1 S2 Pulses are 2+ & equal.No m/r/g            No carotid bruit. No JVD.  No abdominal bruits. No femoral bruits. Abdomen: Bowel sounds are positive, abdomen soft and non-tender without masses. No hepatosplenomegaly. Msk:  Back normal. Normal strength and tone for age. Extremities:   No clubbing, cyanosis or edema.  DP +1 Neuro: Alert and oriented X 3, non-focal, MAE x 4 GU: Deferred Rectal: Deferred Psych:  Good affect, responds appropriately    Labs:   Lab Results  Component Value Date   WBC 9.6 06/30/2013   HGB 19.8* 06/30/2013   HCT 55.6* 06/30/2013   MCV 91.6  06/30/2013   PLT 166 06/30/2013    Recent Labs Lab 06/30/13 1640  NA 135  K 3.8  CL 96  CO2 30  BUN 11  CREATININE 0.47*  CALCIUM 10.1  GLUCOSE 100*   Lab Results  Component Value Date   CKTOTAL 38 05/07/2011   CKMB 1.5 05/07/2011   TROPONINI <0.30 06/30/2013      Radiology:  Dg Chest 2 View  06/30/2013   CLINICAL DATA:  Chest pain, shortness of Breath.  EXAM: CHEST  2 VIEW  COMPARISON:  09/26/2012  FINDINGS: Prior CABG. Heart is upper limits normal in size. No confluent opacities or effusions. No acute bony abnormality.  IMPRESSION: No active disease.   Electronically Signed   By: Charlett Nose M.D.   On: 06/30/2013 18:10   Personally viewed.  EKG: NSR with NSSTW changes, no change from prior Personally viewed.   NUC 2012 - low risk, no  ischemia, normal EF.   ASSESSMENT/PLAN:   57 year old with CAD, post CABG, HA, uncontrolled HTN, Family history of CAD, here with 2 week history of chest pain intermittent concerning for possible unstable angina.  1) Angina  - NUC stress for further risk stratification  2) CAD  - post CABG  - Came to one post op follow up in 2010 and from there has been lost to follow up.   - secondary prevention, agree with current medication choices (beta blocker, ASA).  - would add atorvastatin 40mg   3) HTN/ HTN emergency  - better control this am  - add ACE-I if further control is needed.  - may be contributing to head ache  4) Polycythemia  - at last admit recommended HEME appt. (2012).   - continue with this recommendation.   - worsened by smoking  5) Tob cessation  If NUC stress low risk, DC home If high risk or suggestive of large amount of ischemia - cath     Donato Schultz, MD  07/01/2013  6:35 AM

## 2013-07-01 NOTE — Discharge Summary (Signed)
Family Medicine Teaching [facility] Discharge Summary  Patient name: [name] Medical record number: [id number] Date of birth: 1956/03/09 Age: 57 y.o. Gender: female Date of Admission: 06/30/2013  Date of Discharge: 07/02/13 Admitting Physician: Janit Pagan, MD  Primary Care Provider: No PCP Per Patient Consultants: Cardiology  Indication for Hospitalization: Hypertensive emergency and chest pain  Discharge Diagnoses/Problem List:   # Chest Pain - Pt with significant risk factors for cardiac origin including prior CABG 2003, HTN urgency (BP > 180/110), current smoker, and family history of CAD in setting of typical angina. Last myoview was in 2012 with EF 62% and no stress ischemia but some septal wall hypokinesis. Admit proBNP 193. Seen by Dr. Anne Fu, [facility] cardiology in the past who was again consulted. EKG(admit): NSR, ST depression II, III, AvF. Old EKG Reviewed: No significant changes noted. Troponin's were negative, and repeat EKG showed no changes. She was monitored on tele and had repeated episodes of bradycardia over night. Nuclear stress shows inferior ischemia, and was treated with Coreg, ASA, and Imdur 60mg  qd. May need to have cath in future; will follow with Cards as out pt. Risk strtification labs: A1C 5.8; TSH wnl; Lipid panel: Tchol 234, LDL 157, HDL 25. Her CV Risk >19%, and Lipitor was started.   # Hypertensive Emergency: BP (admit) 190/90. Started Metoprolol 5mg  q6hrs with goal to lower BP no more than 25%.  Responded extremely well, and switched to Coreg 6.25 BID. At discharge BP was 130/55.   # HA: Headaches x 3 wks, wakes her up at night, better with sitting up concerning for increased intracranial pressure. No wt changes, has fatigue and night sweats for years (relates to menopause). No CN palsies, Sharp optic disk. Improved with HTN treatment but did not resolve. Consider brain imaging as outpatient if not improved with BP control.   # Polycythemia (Unknown  etiology) Slowing increasing since 2010. Likely secondary to hypoxia due to pulmonary disease. She did not destat with ambulation. PFTs was obtained, but results not back at discharge. H&H improved during hospitalization: (9/11) 16.7 & 50.4; (admit): 19.8 & 55.6. Considering EPO level depending on PFT results. Pt very motivated to quit smoking.   Disposition: Home  Discharge Condition: Stable  Brief Hospital Course: [name] is a 57 y.o. female who presented with hypertensive urgency and ongoing intermittent substernal CP and fatigue. PMHx of CABG in 2003, current smoker, polycythemia of unknown etiology, GERD. Pt stated that around 3 weeks ago she developed intermittent frontal HA's, that occur every night (sometimes twice) and are severe enough to wake her up in the middle of the night. They were relieved by sitting up; and she denied recent weight loss. She also reported intermittent substernal chest pressure "squezzing" with radiation to both arms and numbness in fingers that gets worse with activity. She get some difficultly swollowing with these episodes but denies diaphoresis. She can get dyspnea on exertion (walking up stairs) relieved completely by rest. The SOB has occurred in the past, and she uses an albuterol inhaler as needed (No MDI x 6 mos). She was sent to urgent care where BP was 191/72 and sent to the ED. Her BP was treated with IV Metoprolol then switched to Coreg PO. She received a stress test that was nondiagnostic a myocar that showed reversible ischemia in inferior wall and inferior lateral wall. She was started on Coreg, Lipitor, ASA, and  Imdur. She was instructed to follow up with cardiology as outpatient for possible Cath if  symptoms continue.   Issues for Follow Up:  1. Follow-up on PFT results 2. New nighttime headaches: Some symptoms concerning for Inc intracranial pressure. Consider Imaging if not resolved with HTN treatment 3. Polycythemia: Consider EPO levels  depending on PFT results 4. Smoking Cessation: Pt motivated; provide support as needed  Significant Procedures:    Exercise Stress Test  Interpretation  Summary: Resting ECG: abnormal.  Functional Capacity: Could Not Be Adequately Assessed.  HR Response to Exercise: appropriate.  BP Response to Exercise: normal resting BP - appropriate response.  Chest Pain: limiting.  Arrhythmias: atrial premature beats, none.  ST Changes: none.  Overall Impression: Nondiagnostic stress test.    Nm Myocar Multi W/spect W/wall Motion / Ef  07/01/2013 CLINICAL DATA: Coronary artery disease, unstable angina EXAM: MYOCARDIAL IMAGING WITH SPECT (REST AND PHARMACOLOGIC-STRESS) GATED LEFT VENTRICULAR WALL MOTION STUDY LEFT VENTRICULAR EJECTION FRACTION TECHNIQUE: FINDINGS: Perfusion: There is significant gut activity on the stress images. There are decreased counts within the mid and basilar segments of the inferior wall and inferior lateral wall which improved from stress to rest. Wall motion: Mild dyskinesia of the inferior wall. Ejection fraction: Left ventricular ejection fraction equals 61% End-diastolic volume equals 79 mL End systolic volume equals 31 mL  IMPRESSION: 1. Decrease counts within the mid and basilar segment of the inferior wall are concerning for reversible ischemia (of note, there is a gut activity on the stress images which could exaggerate these findings.) 2. Mild dyskinesia of the inferior wall 3. Ejection fraction equals 61%   PFTs: Results pending at discharge  Significant Labs and Imaging:   Recent Labs Lab 06/30/13 1640 07/01/13 0645  WBC 9.6 7.0  HGB 19.8* 16.7*  HCT 55.6* 50.4*  PLT 166 148*    Recent Labs Lab 06/30/13 1640 07/01/13 0645  NA 135 139  K 3.8 3.8  CL 96 104  CO2 30 28  GLUCOSE 100* 105*  BUN 11 12  CREATININE 0.47* 0.52  CALCIUM 10.1 8.9    Recent Labs Lab 06/30/13 2255 07/01/13 0645 07/01/13 1225  TROPONINI <0.30 <0.30 <0.30    Imaging/Diagnostic Tests:  CXR: Heart is upper limits normal in size. No active disease.   Results/Tests Pending at Time of Discharge:  1. Pulmonary Function Test  Discharge Medications:    Medication List    STOP taking these medications       aspirin 500 MG tablet  Replaced by:  aspirin EC 81 MG tablet      TAKE these medications       aspirin EC 81 MG tablet  Take 1 tablet (81 mg total) by mouth daily.     atorvastatin 40 MG tablet  Commonly known as:  LIPITOR  Take 1 tablet (40 mg total) by mouth daily at 6 PM.     carvedilol 6.25 MG tablet  Commonly known as:  COREG  Take 1 tablet (6.25 mg total) by mouth 2 (two) times daily with a meal.     isosorbide mononitrate 60 MG 24 hr tablet  Commonly known as:  IMDUR  Take 1 tablet (60 mg total) by mouth daily.       Discharge Instructions: Please refer to Patient Instructions section of EMR for full details.  Patient was counseled important signs and symptoms that should prompt return to medical care, changes in medications, dietary instructions, activity restrictions, and follow up appointments.   Follow-Up Appointments:  Wenda Low, MD 07/01/2013, 6:57 AM PGY-1, [facility]

## 2013-07-01 NOTE — Progress Notes (Signed)
Had Lexiscan and severe dyspnea and chest pressure without ECG changes. Images pending.

## 2013-07-01 NOTE — Progress Notes (Signed)
Utilization Review Completed.   Kobyn Kray, RN, BSN Nurse Case Manager  [phone]  

## 2013-07-01 NOTE — Progress Notes (Signed)
Family Medicine Teaching Service Daily Progress Note Intern Pager: [phone]  Patient name: [name] Medical record number: [id number] Date of birth: [birth year] Age: 57 y.o. Gender: female  Primary Care Provider: No PCP Per Patient Consultants: [name]  Code Status: Full  Pt Overview and Major Events to Date:   9/10: MI work-up w/ risk stratification, Hypertensive emergency (190/90), Concerning HAs  Assessment and Plan: [name] is a 57 y.o. female presenting with chest pain, HA, and hypertensive emergency . PMH is significant for CABG in 2003, current smoker, polycythemia of unknown etiology, GERD.   # Chest Pain  - Pt with significant risk factors for cardiac origin including prior CABG 2003, HTN urgency (BP > 180/110), current smoker, and family history of CAD. As well, she is having typical angina, intermittent stable angina, that improves with rest. Last myoview was in 2012 with EF 62% and no stress ischemia but some septal wall hypokinesis. Admit proBNP 193. She has seen Dr. Anne Fleming, [name] Fleming in the past for previous hospitalizations. Will admit to tele for MI rule out and risk stratification  - EKG(admit): NSR, ST depression II, III, AvF. Old EKG Reviewed: No significant changes noted  - Repeat EKG: pending - Trop x 3: pending;  POCT trop: neg  - Cards Consulted: Dr. Harmon Fleming Fleming   - Getting NM Myocar Multi W/Spect W/Wall Motion / EF now - ASA: Started Coreg 6.25 BID - Likely need ACE at discharge  - Risk strtification labs: A1C, TSH pending  - Lipid panel: Tchol 234, LDL 157, HDL 25; CV Risk >19% - Statin started   # Hypertensive Emergency  - BP (admit) 190/90. Started Metoprolol 5mg  q6hrs - will add Ace next, goal to lower BP no more than 25%. Responded extremely well. BP 122/49 (9/11) @ 5am; Rebounded to 176/96 @ 9am while going for myoview - BP Goal 160/100 - Switched to Coreg 6.25 BID - BMP: wnl (except Gluc 100); Cr 0.47; repeat  wnl (except gluc 105) Cr 0.52   # HA  - Headaches x 3 wks, wakes her up at night, better with sitting up concerning for increased intracranial pressure. No wt changes, has fatigue and night sweats for years (relates to menopause). No CN palsies, Sharp optic disk  - No HA last night overnight; Will treat HTN and reassess:  - Likely need ACE at discharge - Considering brain imaging if not improved with BP control   # Polycythemia (Unknown etiology)  - Slowing increasing since 2010. Likely secondary to hypoxia due to pulmonary disease  - Will ambulate with pulse ox today - PFTs ordered - H&H: (9/11) 16.7 & 50.4;  (admit): 19.8 & 55.6  - Considering EPO level; will discuss with team  FEN/GI: Heart healthy diet, NPO since midnight for possible stress test in the am  IVF: 100 cc/hr   Prophylaxis: Lovenox   Disposition: Stable  Subjective: Pt denies HA last night. Endorsed some chest tightness during Myocar w/ spect  Objective: Temp:  [97 F (36.1 C)-98.3 F (36.8 C)] 97.9 F (36.6 C) (09/11 0457) Pulse Rate:  [57-94] 59 (09/11 0457) Resp:  [15-36] 19 (09/11 0457) BP: (122-206)/(49-103) 122/49 mmHg (09/11 0457) SpO2:  [94 %-100 %] 96 % (09/11 0457) Weight:  [158 lb 14.4 oz (72.077 kg)-159 lb 9.6 oz (72.394 kg)] 159 lb 9.6 oz (72.394 kg) (09/11 0457) Physical Exam: Gen: WD/WN in NAD  Neuro: A&Ox4; CN 2-12 intact; Short & Long term memory intact; Gross Sensory & Motor intact  Eyes:Sclera white; Conjunctiva pink; PERRLA; EOMI; Couple beats of nystagmus on lateral gaze CV: RRR, No m/r/g; No Carotid bruits; No JVD; No lower ext. edema  Lungs: CTAB; Lungs resonant  Ext: 2+DP pulses, no LE edema  Laboratory: Results for orders placed during the hospital encounter of 06/30/13 (from the past 24 hour(s))  CBC     Status: Abnormal   Collection Time    06/30/13  4:40 PM      Result Value Range   WBC 9.6  4.0 - 10.5 K/uL   RBC 6.07 (*) 3.87 - 5.11 MIL/uL   Hemoglobin 19.8 (*) 12.0 -  15.0 g/dL   HCT 78.2 (*) 95.6 - 21.3 %   MCV 91.6  78.0 - 100.0 fL   MCH 32.6  26.0 - 34.0 pg   MCHC 35.6  30.0 - 36.0 g/dL   RDW 08.6  57.8 - 46.9 %   Platelets 166  150 - 400 K/uL  BASIC METABOLIC PANEL     Status: Abnormal   Collection Time    06/30/13  4:40 PM      Result Value Range   Sodium 135  135 - 145 mEq/L   Potassium 3.8  3.5 - 5.1 mEq/L   Chloride 96  96 - 112 mEq/L   CO2 30  19 - 32 mEq/L   Glucose, Bld 100 (*) 70 - 99 mg/dL   BUN 11  6 - 23 mg/dL   Creatinine, Ser 6.29 (*) 0.50 - 1.10 mg/dL   Calcium 52.8  8.4 - 41.3 mg/dL   GFR calc non Af Amer >90  >90 mL/min   GFR calc Af Amer >90  >90 mL/min  PRO B NATRIURETIC PEPTIDE     Status: Abnormal   Collection Time    06/30/13  4:40 PM      Result Value Range   Pro B Natriuretic peptide (BNP) 192.3 (*) 0 - 125 pg/mL  POCT I-STAT TROPONIN I     Status: None   Collection Time    06/30/13  4:54 PM      Result Value Range   Troponin i, poc 0.03  0.00 - 0.08 ng/mL   Comment 3           TROPONIN I     Status: None   Collection Time    06/30/13 10:55 PM      Result Value Range   Troponin I <0.30  <0.30 ng/mL   Imaging/Diagnostic Tests: CXR: Heart is upper limits normal in size. No active disease.  Rhonda Low, MD 07/01/2013, 6:42 AM PGY-1, [facility] FPTS Intern pager: [phone], text pages welcome

## 2013-07-01 NOTE — Progress Notes (Signed)
Pt a/o, pt c/o HA PRN tylenol given, with slight affect, pt has stress test done today, pt on BP meds and BP has come down since admission, last BP 135/64, pt states she is feeling better, will continue to monitor

## 2013-07-01 NOTE — Progress Notes (Signed)
I discussed with  Dr Joyner.  I agree with their plans documented in their progress note for today.  

## 2013-07-02 ENCOUNTER — Observation Stay (HOSPITAL_COMMUNITY): Payer: BC Managed Care – PPO

## 2013-07-02 DIAGNOSIS — I2510 Atherosclerotic heart disease of native coronary artery without angina pectoris: Secondary | ICD-10-CM

## 2013-07-02 MED ORDER — ALBUTEROL SULFATE (5 MG/ML) 0.5% IN NEBU
2.5000 mg | INHALATION_SOLUTION | Freq: Once | RESPIRATORY_TRACT | Status: AC
  Administered 2013-07-02: 2.5 mg via RESPIRATORY_TRACT
  Filled 2013-07-02: qty 0.5

## 2013-07-02 MED ORDER — ISOSORBIDE MONONITRATE ER 60 MG PO TB24: 60.0000 mg | ORAL_TABLET | Freq: Every day | ORAL | Status: DC

## 2013-07-02 MED ORDER — ATORVASTATIN CALCIUM 40 MG PO TABS: 40.0000 mg | ORAL_TABLET | Freq: Every day | ORAL | Status: DC

## 2013-07-02 MED ORDER — CARVEDILOL 6.25 MG PO TABS: 6.2500 mg | ORAL_TABLET | Freq: Two times a day (BID) | ORAL | Status: DC

## 2013-07-02 MED ORDER — ASPIRIN EC 81 MG PO TBEC: 81.0000 mg | DELAYED_RELEASE_TABLET | Freq: Every day | ORAL | Status: DC

## 2013-07-02 NOTE — Progress Notes (Signed)
Pt a/o, independent, pt ambulated in hallway no c/o SOB, O2 WNL, BP stable, will continue to monitor

## 2013-07-02 NOTE — Progress Notes (Signed)
Family Medicine Teaching Service Daily Progress Note Intern Pager: [phone]  Patient name: [name] Medical record number: [id number] Date of birth: [birth year] Age: 57 y.o. Gender: female  Primary Care Provider: No PCP Per Patient Consultants: [facility]  Code Status: Full  Pt Overview and Major Events to Date:   9/10: MI work-up w/ risk stratification, Hypertensive emergency (190/90), Concerning HAs 9/11: Sress shows inferior ischemia: Add Imdur, Cards follow-up outpt  Assessment and Plan: [name] is a 57 y.o. female presenting with chest pain, HA, and hypertensive emergency . PMH is significant for CABG in 2003, current smoker, polycythemia of unknown etiology, GERD.   # Chest Pain  - Pt with significant risk factors for cardiac origin including prior CABG 2003, HTN urgency (BP > 180/110), current smoker, and family history of CAD. As well, she is having typical angina, intermittent stable angina, that improves with rest. Last myoview was in 2012 with EF 62% and no stress ischemia but some septal wall hypokinesis. Admit proBNP 193. She has seen Dr. Anne Fu, [facility] cardiology in the past for previous hospitalizations. Will admit to tele for MI rule out and risk stratification  - EKG(admit): NSR, ST depression II, III, AvF. Old EKG Reviewed: No significant changes noted  - Tele: Brady to 48 @ approx 4am and 6:30am - Repeat EKG: NSR - Trop Neg x 3:;  POCT trop: neg  - Cards Consulted: Dr. Anne Fu, [facility]   - nuclear stress shows inferior ischemia: intensify med therapy:  BB, ASA, Imdur 60mg  qd  - cath may be necessary in future; will follow with Cards as out pt - Risk strtification labs: A1C 5.8, TSH wnl  - Lipid panel: Tchol 234, LDL 157, HDL 25; CV Risk >19% - Statin started - ASA; Coreg 6.25 BID: Likely need ACE at discharge   # Hypertensive Emergency  - BP (admit) 190/90. Started Metoprolol 5mg  q6hrs - will add Ace next, goal to lower BP no more than  25%. Responded extremely well.  - BP Goal 160/100 - BP today: 130-150/55-70 - Continue Coreg 6.25 BID - BMP: wnl (except Gluc 100); Cr 0.47; repeat wnl (except gluc 105) Cr 0.52   # HA  - Headaches x 3 wks, wakes her up at night, better with sitting up concerning for increased intracranial pressure. No wt changes, has fatigue and night sweats for years (relates to menopause). No CN palsies, Sharp optic disk  - Improved 5/10 HA last night; Continue to treat HTN and reassess as outpatient   - Considering brain imaging if not improved with BP control   # Polycythemia (Unknown etiology)  - Slowing increasing since 2010. Likely secondary to hypoxia due to pulmonary disease  - Will ambulate with pulse ox today - PFTs ordered - H&H: (9/11) 16.7 & 50.4;  (admit): 19.8 & 55.6  - Considering EPO level; will discuss with team - Ambulation w/ pulse ox and PFTs today  FEN/GI: Heart healthy diet, NPO since midnight for possible stress test in the am  IVF: 100 cc/hr   Prophylaxis: Lovenox   Disposition: Stable  Subjective: Reports mild HA last night 5/10 relieved by tylenol and sitting up for . Mild sinus pressure, HA and throat dryness this morning.   Objective: Temp:  [97.6 F (36.4 C)-98.6 F (37 C)] 98.1 F (36.7 C) (09/12 0642) Pulse Rate:  [57-64] 64 (09/12 0642) Resp:  [18-20] 18 (09/12 0642) BP: (117-179)/(55-117) 130/55 mmHg (09/12 0642) SpO2:  [95 %-97 %] 97 % (09/12 0642)  Weight:  [160 lb 12.8 oz (72.938 kg)] 160 lb 12.8 oz (72.938 kg) (09/12 4098) Physical Exam: Gen: WD/WN in NAD  Neuro: A&Ox4; Eyes:Sclera white; Conjunctiva pink; PERRLA; EOMI; Couple beats of nystagmus on lateral gaze CV: RRR, No m/r/g; No Carotid bruits; No JVD; No lower ext. edema  Lungs: CTAB; Lungs resonant  Ext: 2+DP pulses, no LE edema  Laboratory: Results for orders placed during the hospital encounter of 06/30/13 (from the past 24 hour(s))  CBC     Status: Abnormal   Collection Time     06/30/13  4:40 PM      Result Value Range   WBC 9.6  4.0 - 10.5 K/uL   RBC 6.07 (*) 3.87 - 5.11 MIL/uL   Hemoglobin 19.8 (*) 12.0 - 15.0 g/dL   HCT 11.9 (*) 14.7 - 82.9 %   MCV 91.6  78.0 - 100.0 fL   MCH 32.6  26.0 - 34.0 pg   MCHC 35.6  30.0 - 36.0 g/dL   RDW 56.2  13.0 - 86.5 %   Platelets 166  150 - 400 K/uL  BASIC METABOLIC PANEL     Status: Abnormal   Collection Time    06/30/13  4:40 PM      Result Value Range   Sodium 135  135 - 145 mEq/L   Potassium 3.8  3.5 - 5.1 mEq/L   Chloride 96  96 - 112 mEq/L   CO2 30  19 - 32 mEq/L   Glucose, Bld 100 (*) 70 - 99 mg/dL   BUN 11  6 - 23 mg/dL   Creatinine, Ser 7.84 (*) 0.50 - 1.10 mg/dL   Calcium 69.6  8.4 - 29.5 mg/dL   GFR calc non Af Amer >90  >90 mL/min   GFR calc Af Amer >90  >90 mL/min  PRO B NATRIURETIC PEPTIDE     Status: Abnormal   Collection Time    06/30/13  4:40 PM      Result Value Range   Pro B Natriuretic peptide (BNP) 192.3 (*) 0 - 125 pg/mL  POCT I-STAT TROPONIN I     Status: None   Collection Time    06/30/13  4:54 PM      Result Value Range   Troponin i, poc 0.03  0.00 - 0.08 ng/mL   Comment 3           TROPONIN I     Status: None   Collection Time    06/30/13 10:55 PM      Result Value Range   Troponin I <0.30  <0.30 ng/mL   Imaging/Diagnostic Tests: CXR: Heart is upper limits normal in size. No active disease.  Exercise Stress Test Interpretation Summary: Resting ECG: abnormal. Functional Capacity: Could Not Be Adequately Assessed. HR Response to Exercise: appropriate. BP Response to Exercise: normal resting BP - appropriate response. Chest Pain: limiting. Arrhythmias: atrial premature beats, none. ST Changes: none. Overall Impression: Nondiagnostic stress test.  Nm Myocar Multi W/spect W/wall Motion / Ef  07/01/2013 CLINICAL DATA: Coronary artery disease, unstable angina EXAM: MYOCARDIAL IMAGING WITH SPECT (REST AND PHARMACOLOGIC-STRESS) GATED LEFT VENTRICULAR WALL MOTION STUDY LEFT  VENTRICULAR EJECTION FRACTION TECHNIQUE: FINDINGS: Perfusion: There is significant gut activity on the stress images. There are decreased counts within the mid and basilar segments of the inferior wall and inferior lateral wall which improved from stress to rest. Wall motion: Mild dyskinesia of the inferior wall. Ejection fraction: Left ventricular ejection fraction equals 61% End-diastolic volume equals 79  mL End systolic volume equals 31 mL   IMPRESSION: 1. Decrease counts within the mid and basilar segment of the inferior wall are concerning for reversible ischemia (of note, there is a gut activity on the stress images which could exaggerate these findings.) 2. Mild dyskinesia of the inferior wall 3. Ejection fraction equals 61%  Wenda Low, MD 07/02/2013, 7:27 AM PGY-1, [facility] Family Medicine FPTS Intern pager: [phone], text pages welcome

## 2013-07-02 NOTE — Progress Notes (Signed)
Pt being d/c to home, pt given d/c instructions, follow up appointments and medications reviewed, pt verbalized understanding, pt leaving via wheelchair with family, pt stable

## 2013-07-02 NOTE — ED Provider Notes (Signed)
I saw and evaluated the patient, reviewed the resident's note and I agree with the findings and plan.  Please see my separate note regarding my evaluation of the patient.  Clinical Impression:  Chest Pain    Vida Roller, MD 07/02/13 [phone]

## 2013-07-02 NOTE — Progress Notes (Signed)
I discussed with  Dr Joyner.  I agree with their plans documented in their progress note for today.  

## 2013-07-02 NOTE — Progress Notes (Addendum)
Subjective:  In bed, no CP, no SOB. Had mild HA 30 min last night. BP improved.   Objective:  Vital Signs in the last 24 hours: Temp:  [97.6 F (36.4 C)-98.6 F (37 C)] 98.1 F (36.7 C) (09/12 0642) Pulse Rate:  [57-64] 64 (09/12 0642) Resp:  [18-20] 18 (09/12 0642) BP: (117-179)/(55-117) 130/55 mmHg (09/12 0642) SpO2:  [95 %-97 %] 97 % (09/12 0642) Weight:  [72.938 kg (160 lb 12.8 oz)] 72.938 kg (160 lb 12.8 oz) (09/12 0642)  Intake/Output from previous day: 09/11 0701 - 09/12 0700 In: 480 [P.O.:480] Out: 200 [Urine:200]   Physical Exam: General: Well developed, well nourished, in no acute distress. Head:  Normocephalic and atraumatic. Lungs: Clear to auscultation and percussion. Heart: Normal S1 and S2.  No murmur, rubs or gallops.  Abdomen: soft, non-tender, positive bowel sounds. Extremities: No clubbing or cyanosis. No edema. Neurologic: Alert and oriented x 3.    Lab Results:  Recent Labs  06/30/13 1640 07/01/13 0645  WBC 9.6 7.0  HGB 19.8* 16.7*  PLT 166 148*    Recent Labs  06/30/13 1640 07/01/13 0645  NA 135 139  K 3.8 3.8  CL 96 104  CO2 30 28  GLUCOSE 100* 105*  BUN 11 12  CREATININE 0.47* 0.52    Recent Labs  07/01/13 0645 07/01/13 1225  TROPONINI <0.30 <0.30   Hepatic Function Panel No results found for this basename: PROT, ALBUMIN, AST, ALT, ALKPHOS, BILITOT, BILIDIR, IBILI,  in the last 72 hours  Recent Labs  07/01/13 0645  CHOL 234*   No results found for this basename: PROTIME,  in the last 72 hours  Imaging: Dg Chest 2 View  06/30/2013   CLINICAL DATA:  Chest pain, shortness of Breath.  EXAM: CHEST  2 VIEW  COMPARISON:  09/26/2012  FINDINGS: Prior CABG. Heart is upper limits normal in size. No confluent opacities or effusions. No acute bony abnormality.  IMPRESSION: No active disease.   Electronically Signed   By: Charlett Nose M.D.   On: 06/30/2013 18:10   Nm Myocar Multi W/spect W/wall Motion / Ef  07/01/2013   CLINICAL  DATA:  Coronary artery disease, unstable angina  EXAM: MYOCARDIAL IMAGING WITH SPECT (REST AND PHARMACOLOGIC-STRESS)  GATED LEFT VENTRICULAR WALL MOTION STUDY  LEFT VENTRICULAR EJECTION FRACTION  TECHNIQUE: Standard myocardial SPECT imaging was performed after resting intravenous injection of 10 mCi Tc-69m sestamibi. Subsequently, intravenous infusion of Lexiscan was performed under the supervision of the Cardiology staff. At peak effect of the drug, 30 mCi Tc-67m sestamibi was injected intravenously and standard myocardial SPECT imaging was performed. Quantitative gated imaging was also performed to evaluate left ventricular wall motion and estimate left ventricular ejection fraction.  COMPARISON:  Nuclear perfusion scan 05/08/2011.  FINDINGS: Perfusion: There is significant gut activity on the stress images. There are decreased counts within the mid and basilar segments of the inferior wall and inferior lateral wall which improved from stress to rest.  Wall motion: Mild dyskinesia of the inferior wall.  Ejection fraction:  Left ventricular ejection fraction equals 61%  End-diastolic volume equals 79 mL  End systolic volume equals 31 mL  IMPRESSION: 1. Decrease counts within the mid and basilar segment of the inferior wall are concerning for reversible ischemia (of note, there is a gut activity on the stress images which could exaggerate these findings.)  2. Mild dyskinesia of the inferior wall  3.  Ejection fraction equals 61%  These results will be called to the  ordering clinician or representative by the Radiologist Assistant, and communication documented in the PACS Dashboard.   Electronically Signed   By: Genevive Bi M.D.   On: 07/01/2013 12:35   Personally viewed.   Telemetry: No adverse rhythms Personally viewed.    Cardiac Studies:  NUC, low risk, base to mid inferior wall mild reversibiltiy. MED MGT.   Assessment/Plan:  Principal Problem:   Typical angina Active Problems:    Headache(784.0)   Hypertensive emergency   Polycythemia   1) Angina  - med mgt. Bb and Imdur  - statin  - ASA  - If symptoms worsen, cath can be entertained. For now encourage lifestyle changes, medications.   2) CAD  - post CABG  - Trop neg  - normal EF  3) Tob cessation  Encourage her to follow PCP I will make an appt with me as well.   OK for DC from CV standpoint.   Laneice Meneely 07/02/2013, 8:05 AM

## 2013-07-02 NOTE — Progress Notes (Signed)
SATURATION QUALIFICATIONS: (This note is used to comply with regulatory documentation for home oxygen)  Patient Saturations on Room Air at Rest = 96%  Patient Saturations on Room Air while Ambulating = 96%  Patient Saturations on room air while Ambulating = 96%  Please briefly explain why patient needs home oxygen: pt ambulated in hallway O2 94-96% on room air, no c/o SOB

## 2013-07-02 NOTE — Progress Notes (Signed)
07/01/13 Pt.A/Ox4, ambulates independently, and is on room air. She had no signs of distress. She had c/o headache pain and prn pain medication was given.

## 2013-07-03 NOTE — Discharge Summary (Signed)
I have seen and examined this patient. I have discussed with Dr Joyner.  I agree with their findings and plans as documented in their discharge note.    

## 2013-07-07 ENCOUNTER — Ambulatory Visit: Payer: BC Managed Care – PPO | Attending: Cardiology | Admitting: Cardiology

## 2013-07-07 ENCOUNTER — Encounter: Payer: Self-pay | Admitting: Cardiology

## 2013-07-07 VITALS — BP 176/84 | HR 58 | Temp 98.6°F | Resp 19 | Wt 160.0 lb

## 2013-07-07 DIAGNOSIS — J449 Chronic obstructive pulmonary disease, unspecified: Secondary | ICD-10-CM | POA: Insufficient documentation

## 2013-07-07 DIAGNOSIS — Z951 Presence of aortocoronary bypass graft: Secondary | ICD-10-CM | POA: Insufficient documentation

## 2013-07-07 DIAGNOSIS — R079 Chest pain, unspecified: Secondary | ICD-10-CM

## 2013-07-07 DIAGNOSIS — I161 Hypertensive emergency: Secondary | ICD-10-CM

## 2013-07-07 DIAGNOSIS — I10 Essential (primary) hypertension: Secondary | ICD-10-CM

## 2013-07-07 MED ORDER — AMLODIPINE BESYLATE 5 MG PO TABS: 5.0000 mg | ORAL_TABLET | Freq: Every day | ORAL | Status: DC

## 2013-07-07 MED ORDER — ALBUTEROL SULFATE HFA 108 (90 BASE) MCG/ACT IN AERS: 2.0000 | INHALATION_SPRAY | Freq: Four times a day (QID) | RESPIRATORY_TRACT | Status: DC | PRN

## 2013-07-07 NOTE — Assessment & Plan Note (Signed)
This was most likely related to her hypertensive urgency.

## 2013-07-07 NOTE — Progress Notes (Signed)
Patient here for follow up from ED- abd pain

## 2013-07-07 NOTE — Assessment & Plan Note (Signed)
Results of pulmonary function tests not available. She has moderate COPD by history and exam. She's quit smoking which is remarkable. Encouragement given. Will give when necessary albuterol inhaler.

## 2013-07-07 NOTE — Assessment & Plan Note (Signed)
Low risk Myoview. Normal left ventricular systolic function. Continue secondary preventative treatment.

## 2013-07-07 NOTE — Assessment & Plan Note (Signed)
Better blood pressure control but needs additional medication. With her pulmonary disease and history of bradycardia, will not increase carvedilol. We'll add amlodipine at 5 mg a day. We'll have her followup for blood pressure check in a week. Followup with me in 3 months. She has an appointment with [place] primary care November.

## 2013-07-08 DIAGNOSIS — Z23 Encounter for immunization: Secondary | ICD-10-CM

## 2013-07-13 NOTE — Progress Notes (Signed)
HPI   Past Medical History  Diagnosis Date  . Coronary artery disease   . Bronchitis   . Sinusitis   . Hypertension     Current Outpatient Prescriptions  Medication Sig Dispense Refill  . albuterol (PROVENTIL HFA;VENTOLIN HFA) 108 (90 BASE) MCG/ACT inhaler Inhale 2 puffs into the lungs every 6 (six) hours as needed for wheezing.  1 Inhaler  0  . amLODipine (NORVASC) 5 MG tablet Take 1 tablet (5 mg total) by mouth daily.  90 tablet  3  . aspirin EC 81 MG tablet Take 1 tablet (81 mg total) by mouth daily.  30 tablet  0  . atorvastatin (LIPITOR) 40 MG tablet Take 1 tablet (40 mg total) by mouth daily at 6 PM.  30 tablet  0  . carvedilol (COREG) 6.25 MG tablet Take 1 tablet (6.25 mg total) by mouth 2 (two) times daily with a meal.  30 tablet  0  . isosorbide mononitrate (IMDUR) 60 MG 24 hr tablet Take 1 tablet (60 mg total) by mouth daily.  30 tablet  0   No current facility-administered medications for this visit.    No Known Allergies  Family History  Problem Relation Age of Onset  . Heart Problems Mother 55    History   Social History  . Marital Status: Single    Spouse Name: N/A    Number of Children: N/A  . Years of Education: N/A   Occupational History  . Not on file.   Social History Main Topics  . Smoking status: Current Every Day Smoker -- 0.50 packs/day  . Smokeless tobacco: Not on file  . Alcohol Use: Yes     Comment: occasional  . Drug Use: No  . Sexual Activity: Not Currently   Other Topics Concern  . Not on file   Social History Narrative  . No narrative on file    ROS ALL NEGATIVE EXCEPT THOSE NOTED IN HPI  PE  General Appearance: well developed, well nourished in no acute distress HEENT: symmetrical face, PERRLA, good dentition  Neck: no JVD, thyromegaly, or adenopathy, trachea midline Chest: symmetric without deformity Cardiac: PMI non-displaced, RRR, normal S1, S2, no gallop or murmur Lung: clear to ausculation and percussion Vascular:  all pulses full without bruits  Abdominal: nondistended, nontender, good bowel sounds, no HSM, no bruits Extremities: no cyanosis, clubbing or edema, no sign of DVT, no varicosities  Skin: normal color, no rashes Neuro: alert and oriented x 3, non-focal Pysch: normal affect  EKG  BMET    Component Value Date/Time   NA 139 07/01/2013 0645   K 3.8 07/01/2013 0645   CL 104 07/01/2013 0645   CO2 28 07/01/2013 0645   GLUCOSE 105* 07/01/2013 0645   BUN 12 07/01/2013 0645   CREATININE 0.52 07/01/2013 0645   CALCIUM 8.9 07/01/2013 0645   GFRNONAA >90 07/01/2013 0645   GFRAA >90 07/01/2013 0645    Lipid Panel     Component Value Date/Time   CHOL 234* 07/01/2013 0645   TRIG 262* 07/01/2013 0645   HDL 25* 07/01/2013 0645   CHOLHDL 9.4 07/01/2013 0645   VLDL 52* 07/01/2013 0645   LDLCALC 157* 07/01/2013 0645    CBC    Component Value Date/Time   WBC 7.0 07/01/2013 0645   RBC 5.48* 07/01/2013 0645   HGB 16.7* 07/01/2013 0645   HCT 50.4* 07/01/2013 0645   PLT 148* 07/01/2013 0645   MCV 92.0 07/01/2013 0645   MCH 30.5 07/01/2013 0645  MCHC 33.1 07/01/2013 0645   RDW 13.4 07/01/2013 0645   LYMPHSABS 2.5 05/07/2011 1253   MONOABS 0.5 05/07/2011 1253   EOSABS 0.3 05/07/2011 1253   BASOSABS 0.0 05/07/2011 1253

## 2013-07-18 ENCOUNTER — Other Ambulatory Visit (HOSPITAL_COMMUNITY): Payer: Self-pay | Admitting: Family Medicine

## 2013-07-19 NOTE — Telephone Encounter (Signed)
Will forward to MD. Jazmin Hartsell,CMA  

## 2013-07-20 ENCOUNTER — Other Ambulatory Visit: Payer: Self-pay | Admitting: Family Medicine

## 2013-07-20 ENCOUNTER — Other Ambulatory Visit (HOSPITAL_COMMUNITY): Payer: Self-pay | Admitting: Cardiology

## 2013-07-21 ENCOUNTER — Other Ambulatory Visit: Payer: Self-pay | Admitting: *Deleted

## 2013-07-21 MED ORDER — CARVEDILOL 6.25 MG PO TABS: 6.2500 mg | ORAL_TABLET | Freq: Two times a day (BID) | ORAL | Status: DC

## 2013-07-27 ENCOUNTER — Other Ambulatory Visit: Payer: Self-pay | Admitting: Family Medicine

## 2013-07-28 ENCOUNTER — Other Ambulatory Visit: Payer: Self-pay

## 2013-07-28 MED ORDER — ATORVASTATIN CALCIUM 40 MG PO TABS: 40.0000 mg | ORAL_TABLET | Freq: Every day | ORAL | Status: DC

## 2013-07-28 MED ORDER — ISOSORBIDE MONONITRATE ER 60 MG PO TB24: 60.0000 mg | ORAL_TABLET | Freq: Every day | ORAL | Status: DC

## 2013-08-04 ENCOUNTER — Encounter: Payer: Self-pay | Admitting: Physician Assistant

## 2013-08-04 ENCOUNTER — Ambulatory Visit (INDEPENDENT_AMBULATORY_CARE_PROVIDER_SITE_OTHER): Payer: BC Managed Care – PPO | Admitting: Physician Assistant

## 2013-08-04 VITALS — BP 118/76 | HR 56 | Ht 61.0 in | Wt 161.0 lb

## 2013-08-04 DIAGNOSIS — E785 Hyperlipidemia, unspecified: Secondary | ICD-10-CM

## 2013-08-04 DIAGNOSIS — I10 Essential (primary) hypertension: Secondary | ICD-10-CM

## 2013-08-04 DIAGNOSIS — I2510 Atherosclerotic heart disease of native coronary artery without angina pectoris: Secondary | ICD-10-CM

## 2013-08-04 DIAGNOSIS — J449 Chronic obstructive pulmonary disease, unspecified: Secondary | ICD-10-CM

## 2013-08-04 DIAGNOSIS — R079 Chest pain, unspecified: Secondary | ICD-10-CM

## 2013-08-04 MED ORDER — ISOSORBIDE MONONITRATE ER 60 MG PO TB24: 90.0000 mg | ORAL_TABLET | Freq: Every day | ORAL | Status: DC

## 2013-08-04 NOTE — Patient Instructions (Signed)
INCREASE IMDUR TO 90 MG DAILY; NEW RX WAS SENT IN TODAY  FASTING LIPID AND LIVER TO BE DONE IN 1 MONTH  PLEASE FOLLOW UP WITH DR. Anne Fu IN 1 MONTH  USE YOUR ALBUTEROL EVERY 6 HOURS FOR THE NEXT 3 DAYS THEN PRN AFTER THAT. ;MAKE SURE TO USE YOUR INHALER BEFORE WALKING UP INCLINES.

## 2013-08-04 NOTE — Progress Notes (Signed)
[address] [place], [place]  [address] Phone: [phone] Fax:  [phone]  Date:  08/04/2013   ID:  [name], [name] 1956/06/10, MRN [id number]  PCP:  No PCP Per Patient  Cardiologist:  Dr. Donato Schultz      History of Present Illness: [name] is a 57 y.o. female who returns for follow up after a recent admission to the hospital.   She has a history of CAD, status post CABG in 2003, HTN, HL, tobacco abuse, COPD. Myoview 7/12: No ischemia, EF 62%.  She was recently admitted to the hospital 9/10-9/12 with chest pain the setting of hypertensive emergency. Cardiac markers remained normal. She was restarted on her medications. She did undergo pulmonary function testing in the hospital. I cannot locate results. She underwent inpatient Myoview. This demonstrated inferior ischemia, EF 61%. Of note, there was increased gut activity.  This was reviewed with Dr. Anne Fu. Continued medical therapy was recommended. If she fails medical therapy, cardiac catheterization would be considered.  She continues to note dyspnea with exertion. She describes NYHA class II-IIb symptoms. She denies orthopnea, PND or edema. She does note a symptom of wheezing. She also notes a symptom of cough. She does note chest tightness with activity. She denies radiating symptoms, associated nausea or diaphoresis. She can walk approximately a mile before getting chest discomfort. She overall describes CCS class II symptoms. She denies syncope.  Labs (9/14):   K 3.8, creatinine 0.52, HDL 25, LDL 157, Hgb 16.7, TSH 1.940  Wt Readings from Last 3 Encounters:  08/04/13 161 lb (73.029 kg)  07/07/13 160 lb (72.576 kg)  07/02/13 160 lb 12.8 oz (72.938 kg)     Past Medical History  Diagnosis Date  . Coronary artery disease   . Bronchitis   . Sinusitis   . Hypertension     Current Outpatient Prescriptions  Medication Sig Dispense Refill  . amLODipine (NORVASC) 5 MG tablet Take 1 tablet (5 mg total)  by mouth daily.  90 tablet  3  . aspirin EC 81 MG tablet Take 1 tablet (81 mg total) by mouth daily.  30 tablet  0  . atorvastatin (LIPITOR) 40 MG tablet Take 1 tablet (40 mg total) by mouth daily at 6 PM.  30 tablet  0  . carvedilol (COREG) 6.25 MG tablet Take 1 tablet (6.25 mg total) by mouth 2 (two) times daily with a meal.  60 tablet  0  . isosorbide mononitrate (IMDUR) 60 MG 24 hr tablet Take 1.5 tablets (90 mg total) by mouth daily.  45 tablet  11  . albuterol (PROVENTIL HFA;VENTOLIN HFA) 108 (90 BASE) MCG/ACT inhaler Inhale 2 puffs into the lungs every 6 (six) hours as needed for wheezing.  1 Inhaler  0   No current facility-administered medications for this visit.    Allergies:   No Known Allergies  Social History:  The patient  reports that she quit smoking about 5 weeks ago. She does not have any smokeless tobacco history on file. She reports that she drinks alcohol. She reports that she does not use illicit drugs.   Family History:  The patient's family history includes Heart Problems (age of onset: 86) in her mother.   ROS:  Please see the history of present illness.      All other systems reviewed and negative.   PHYSICAL EXAM: VS:  BP 118/76  Pulse 56  Ht 5\' 1"  (1.549 m)  Wt 161 lb (73.029 kg)  BMI 30.44 kg/m2 Well nourished, well developed, in no acute distress HEENT: normal Neck: no JVD Cardiac:  normal S1, S2; RRR; no murmur Lungs:  decreased breath sounds bilaterally, faint expiratory wheezing throughout, no rales Abd: soft, nontender, no hepatomegaly Ext: no edema Skin: warm and dry Neuro:  CNs 2-12 intact, no focal abnormalities noted  EKG:  NSR, HR 56, normal axis, inferolateral T wave inversions, no change from prior tracings     ASSESSMENT AND PLAN:  1. Chest Pain: She continues to have chest discomfort with exertion. Overall, she describes CCS class II angina. She also has significant wheezing from probable underlying COPD. I've encouraged her to use her  when necessary albuterol. I'll adjust her isosorbide to 90 mg daily. If she continues to have symptoms or worsening exertional symptoms, she may need cardiac catheterization. 2. CAD: Adjust nitrates as noted. Continue amlodipine, aspirin, statin, beta blocker. 3. Hyperlipidemia: Continue statin. Check lipids and LFTs in 4 weeks. 4. Hypertension: Controlled. 5. COPD: Continue albuterol. Followup with PCP. 6. Disposition: Followup with Dr. Anne Fu in one month.  Signed, Tereso Newcomer, PA-C  08/04/2013 4:59 PM

## 2013-08-20 ENCOUNTER — Other Ambulatory Visit: Payer: Self-pay

## 2013-08-20 DIAGNOSIS — I2510 Atherosclerotic heart disease of native coronary artery without angina pectoris: Secondary | ICD-10-CM

## 2013-08-20 MED ORDER — ISOSORBIDE MONONITRATE ER 60 MG PO TB24: 90.0000 mg | ORAL_TABLET | Freq: Every day | ORAL | Status: DC

## 2013-08-20 MED ORDER — AMLODIPINE BESYLATE 5 MG PO TABS: 5.0000 mg | ORAL_TABLET | Freq: Every day | ORAL | Status: DC

## 2013-08-20 MED ORDER — ATORVASTATIN CALCIUM 40 MG PO TABS: 40.0000 mg | ORAL_TABLET | Freq: Every day | ORAL | Status: DC

## 2013-08-20 MED ORDER — CARVEDILOL 6.25 MG PO TABS: 6.2500 mg | ORAL_TABLET | Freq: Two times a day (BID) | ORAL | Status: DC

## 2013-08-21 ENCOUNTER — Other Ambulatory Visit: Payer: Self-pay | Admitting: Internal Medicine

## 2013-08-24 ENCOUNTER — Other Ambulatory Visit (INDEPENDENT_AMBULATORY_CARE_PROVIDER_SITE_OTHER): Payer: BC Managed Care – PPO

## 2013-08-24 DIAGNOSIS — E785 Hyperlipidemia, unspecified: Secondary | ICD-10-CM

## 2013-08-24 LAB — HEPATIC FUNCTION PANEL
Albumin: 4 g/dL (ref 3.5–5.2)
Total Bilirubin: 0.6 mg/dL (ref 0.3–1.2)

## 2013-08-24 LAB — LIPID PANEL
Cholesterol: 142 mg/dL (ref 0–200)
LDL Cholesterol: 83 mg/dL (ref 0–99)
Triglycerides: 105 mg/dL (ref 0.0–149.0)
VLDL: 21 mg/dL (ref 0.0–40.0)

## 2013-09-03 ENCOUNTER — Encounter: Payer: Self-pay | Admitting: Internal Medicine

## 2013-09-03 ENCOUNTER — Other Ambulatory Visit: Payer: BC Managed Care – PPO

## 2013-09-03 ENCOUNTER — Ambulatory Visit: Payer: BC Managed Care – PPO | Admitting: Internal Medicine

## 2013-09-03 ENCOUNTER — Ambulatory Visit (INDEPENDENT_AMBULATORY_CARE_PROVIDER_SITE_OTHER): Payer: BC Managed Care – PPO | Admitting: Internal Medicine

## 2013-09-03 VITALS — BP 140/80 | HR 61 | Temp 97.7°F | Ht 61.0 in | Wt 163.5 lb

## 2013-09-03 DIAGNOSIS — J309 Allergic rhinitis, unspecified: Secondary | ICD-10-CM

## 2013-09-03 DIAGNOSIS — J069 Acute upper respiratory infection, unspecified: Secondary | ICD-10-CM | POA: Insufficient documentation

## 2013-09-03 DIAGNOSIS — Z0000 Encounter for general adult medical examination without abnormal findings: Secondary | ICD-10-CM

## 2013-09-03 DIAGNOSIS — I2510 Atherosclerotic heart disease of native coronary artery without angina pectoris: Secondary | ICD-10-CM

## 2013-09-03 DIAGNOSIS — Z0001 Encounter for general adult medical examination with abnormal findings: Secondary | ICD-10-CM | POA: Insufficient documentation

## 2013-09-03 HISTORY — DX: Atherosclerotic heart disease of native coronary artery without angina pectoris: I25.10

## 2013-09-03 MED ORDER — FLUTICASONE PROPIONATE 50 MCG/ACT NA SUSP: 2.0000 | Freq: Every day | NASAL | Status: DC

## 2013-09-03 MED ORDER — LEVOFLOXACIN 250 MG PO TABS: 250.0000 mg | ORAL_TABLET | Freq: Every day | ORAL | Status: DC

## 2013-09-03 NOTE — Progress Notes (Signed)
Subjective:    Patient ID: [name], female    DOB: 1956/08/28, 57 y.o.   MRN: [id number]  HPI  Here for wellness and f/u;  Overall doing ok;  Pt denies CP, worsening SOB, DOE, wheezing, orthopnea, PND, worsening LE edema, palpitations, dizziness or syncope.  Pt denies neurological change such as new headache, facial or extremity weakness.  Pt denies polydipsia, polyuria, or low sugar symptoms. Pt states overall good compliance with treatment and medications, good tolerability, and has been trying to follow lower cholesterol diet.  Pt denies worsening depressive symptoms, suicidal ideation or panic. No fever, night sweats, wt loss, loss of appetite, or other constitutional symptoms.  Pt states good ability with ADL's, has low fall risk, home safety reviewed and adequate, no other significant changes in hearing or vision, and only occasionally active with exercise.  Also 2-3 days acute onset fever, facial pain, pressure, headache, general weakness and malaise, and greenish d/c, after several wks ongoing nasal allergy symptoms with clearish congestion, itch and sneezing, without fever, pain, ST, cough, swelling or wheezing. Past Medical History  Diagnosis Date  . Coronary artery disease   . Bronchitis   . Sinusitis   . Hypertension   . Headache   . Heart disease   . Hyperlipidemia   . CAD (coronary artery disease) 09/03/2013   Past Surgical History  Procedure Laterality Date  . Coronary artery bypass graft  2003    Triple bypass    reports that she quit smoking about 2 months ago. She does not have any smokeless tobacco history on file. She reports that she drinks alcohol. She reports that she does not use illicit drugs. family history includes Cancer in her other; Heart Problems (age of onset: 4) in her mother; Heart disease in her other; Stroke in her other. No Known Allergies Current Outpatient Prescriptions on File Prior to Visit  Medication Sig Dispense Refill  . albuterol  (PROVENTIL HFA;VENTOLIN HFA) 108 (90 BASE) MCG/ACT inhaler Inhale 2 puffs into the lungs every 6 (six) hours as needed for wheezing.  1 Inhaler  0  . amLODipine (NORVASC) 5 MG tablet Take 1 tablet (5 mg total) by mouth daily.  90 tablet  3  . aspirin EC 81 MG tablet Take 1 tablet (81 mg total) by mouth daily.  30 tablet  0  . atorvastatin (LIPITOR) 40 MG tablet Take 1 tablet (40 mg total) by mouth daily at 6 PM.  30 tablet  6  . carvedilol (COREG) 6.25 MG tablet Take 1 tablet (6.25 mg total) by mouth 2 (two) times daily with a meal.  60 tablet  6  . isosorbide mononitrate (IMDUR) 60 MG 24 hr tablet Take 1.5 tablets (90 mg total) by mouth daily.  45 tablet  11   No current facility-administered medications on file prior to visit.    Review of Systems Constitutional: Negative for diaphoresis, activity change, appetite change or unexpected weight change.  HENT: Negative for hearing loss, ear pain, facial swelling, mouth sores and neck stiffness.   Eyes: Negative for pain, redness and visual disturbance.  Respiratory: Negative for shortness of breath and wheezing.   Cardiovascular: Negative for chest pain and palpitations.  Gastrointestinal: Negative for diarrhea, blood in stool, abdominal distention or other pain Genitourinary: Negative for hematuria, flank pain or change in urine volume.  Musculoskeletal: Negative for myalgias and joint swelling.  Skin: Negative for color change and wound.  Neurological: Negative for syncope and numbness. other than noted Hematological: Negative  for adenopathy.  Psychiatric/Behavioral: Negative for hallucinations, self-injury, decreased concentration and agitation.      Objective:   Physical Exam BP 140/80  Pulse 61  Temp(Src) 97.7 F (36.5 C) (Oral)  Ht 5\' 1"  (1.549 m)  Wt 163 lb 8 oz (74.163 kg)  BMI 30.91 kg/m2  SpO2 94% VS noted,  Constitutional: Pt is oriented to person, place, and time. Appears well-developed and well-nourished.  Head:  Normocephalic and atraumatic.  Right Ear: External ear normal.  Left Ear: External ear normal.  Nose: Nose normal.  Mouth/Throat: Oropharynx is clear and moist.  Bilat tm's with mild erythema.  Max sinus areas non tender.  Pharynx with mild erythema, no exudate Eyes: Conjunctivae and EOM are normal. Pupils are equal, round, and reactive to light.  Neck: Normal range of motion. Neck supple. No JVD present. No tracheal deviation present.  Cardiovascular: Normal rate, regular rhythm, normal heart sounds and intact distal pulses.   Pulmonary/Chest: Effort normal and breath sounds normal.  Abdominal: Soft. Bowel sounds are normal. There is no tenderness. No HSM  Musculoskeletal: Normal range of motion. Exhibits no edema.  Lymphadenopathy:  Has no cervical adenopathy.  Neurological: Pt is alert and oriented to person, place, and time. Pt has normal reflexes. No cranial nerve deficit.  Skin: Skin is warm and dry. No rash noted.  Psychiatric:  Has  normal mood and affect. Behavior is normal.     Assessment & Plan:

## 2013-09-03 NOTE — Patient Instructions (Addendum)
Please remember to followup with your GYN for the yearly pap smear and/or mammogram (consider calling [facility] [place]) You will be contacted regarding the referral for: colonoscopy Please take all new medication as prescribed - the antibiotic, and the flonase Please continue all other medications as before Please have the pharmacy call with any other refills you may need. Please continue your efforts at being more active, low cholesterol diet, and weight control. You are otherwise up to date with prevention measures today.  Please keep your appointments with your specialists as you have planned - cardiology  Please return in 1 year for your yearly visit, or sooner if needed, with Lab testing done 3-5 days before

## 2013-09-03 NOTE — Progress Notes (Signed)
Pre-visit discussion using our clinic review tool. No additional management support is needed unless otherwise documented below in the visit note.  

## 2013-09-05 NOTE — Assessment & Plan Note (Signed)
For flonase asd 

## 2013-09-05 NOTE — Assessment & Plan Note (Signed)
Mild to mod, for antibx course,  to f/u any worsening symptoms or concerns 

## 2013-09-05 NOTE — Assessment & Plan Note (Signed)

## 2013-09-08 ENCOUNTER — Ambulatory Visit (INDEPENDENT_AMBULATORY_CARE_PROVIDER_SITE_OTHER): Payer: BC Managed Care – PPO | Admitting: Cardiology

## 2013-09-08 ENCOUNTER — Encounter: Payer: Self-pay | Admitting: Cardiology

## 2013-09-08 VITALS — BP 152/84 | HR 63 | Ht 61.0 in | Wt 162.8 lb

## 2013-09-08 DIAGNOSIS — I2510 Atherosclerotic heart disease of native coronary artery without angina pectoris: Secondary | ICD-10-CM

## 2013-09-08 DIAGNOSIS — Z951 Presence of aortocoronary bypass graft: Secondary | ICD-10-CM

## 2013-09-08 DIAGNOSIS — J4489 Other specified chronic obstructive pulmonary disease: Secondary | ICD-10-CM

## 2013-09-08 DIAGNOSIS — I10 Essential (primary) hypertension: Secondary | ICD-10-CM

## 2013-09-08 DIAGNOSIS — J449 Chronic obstructive pulmonary disease, unspecified: Secondary | ICD-10-CM

## 2013-09-08 MED ORDER — CARVEDILOL 12.5 MG PO TABS: 12.5000 mg | ORAL_TABLET | Freq: Two times a day (BID) | ORAL | Status: DC

## 2013-09-08 NOTE — Progress Notes (Signed)
[address] [place], [place]  [address] Phone: [phone] Fax:  [phone]  Date:  09/08/2013   ID:  [name], [name] 24-Dec-1955, MRN [id number]  PCP:  Oliver Barre, MD   History of Present Illness: [name] is a 57 y.o. female with coronary artery disease status post bypass surgery in 2003, LIMA to LAD, RIMA to RCA, SVG to OM with COPD, hypertension here for followup.  I saw her in the hospital on 07/02/13. She was having hypertensive emergency/headache. Angina was medically managed with both beta blocker and isosorbide as well as statin and aspirin. Troponin was negative. Tobacco cessation encouraged and she states that she has been successful over the past 2 months.  His only has had a head cold and occasional increase in blood pressure to 170.  Wt Readings from Last 3 Encounters:  09/08/13 162 lb 12.8 oz (73.846 kg)  09/03/13 163 lb 8 oz (74.163 kg)  08/04/13 161 lb (73.029 kg)     Past Medical History  Diagnosis Date  . Coronary artery disease   . Bronchitis   . Sinusitis   . Hypertension   . Headache   . Heart disease   . Hyperlipidemia   . CAD (coronary artery disease) 09/03/2013    Past Surgical History  Procedure Laterality Date  . Coronary artery bypass graft  2003    Triple bypass    Current Outpatient Prescriptions  Medication Sig Dispense Refill  . albuterol (PROVENTIL HFA;VENTOLIN HFA) 108 (90 BASE) MCG/ACT inhaler Inhale 2 puffs into the lungs every 6 (six) hours as needed for wheezing.  1 Inhaler  0  . amLODipine (NORVASC) 5 MG tablet Take 1 tablet (5 mg total) by mouth daily.  90 tablet  3  . aspirin EC 81 MG tablet Take 1 tablet (81 mg total) by mouth daily.  30 tablet  0  . atorvastatin (LIPITOR) 40 MG tablet Take 1 tablet (40 mg total) by mouth daily at 6 PM.  30 tablet  6  . carvedilol (COREG) 6.25 MG tablet Take 1 tablet (6.25 mg total) by mouth 2 (two) times daily with a meal.  60 tablet  6  . fluticasone  (FLONASE) 50 MCG/ACT nasal spray Place 2 sprays into both nostrils daily.  16 g  5  . isosorbide mononitrate (IMDUR) 60 MG 24 hr tablet Take 1.5 tablets (90 mg total) by mouth daily.  45 tablet  11  . levofloxacin (LEVAQUIN) 250 MG tablet Take 1 tablet (250 mg total) by mouth daily.  10 tablet  0   No current facility-administered medications for this visit.    Allergies:   No Known Allergies  Social History:  The patient  reports that she quit smoking about 2 months ago. She does not have any smokeless tobacco history on file. She reports that she drinks alcohol. She reports that she does not use illicit drugs.   Family History  Problem Relation Age of Onset  . Heart Problems Mother 34  . Heart disease Other   . Cancer Other     colon  . Stroke Other     ROS:  Please see the history of present illness.   No chest pain, no syncope, no palpitations   All other systems reviewed and negative.   PHYSICAL EXAM: VS:  BP 152/84  Pulse 63  Ht 5\' 1"  (1.549 m)  Wt 162 lb 12.8 oz (73.846 kg)  BMI 30.78 kg/m2 Well  nourished, well developed, in no acute distress HEENT: normal, [place]/AT, EOMI Neck: no JVD, normal carotid upstroke, no bruit Cardiac:  normal S1, S2; RRR; no murmur Lungs:  clear to auscultation bilaterally, no wheezing, rhonchi or rales Abd: soft, nontender, no hepatomegaly, no bruits Ext: no edema, 2+ distal pulses Skin: warm and dry GU: deferred Neuro: no focal abnormalities noted, AAO x 3  EKG:    Sinus bradycardia rate 56, occasional PAC, nonspecific ST-T wave changes   ASSESSMENT AND PLAN:  1. Hypertension-I will try to increase carvedilol to 12.5 mg twice a day. Her pulse today should be able to tolerate this. I do understand she has had some sinus bradycardia in the past. We will monitor. Continue with other medications. 2. Coronary artery disease-stable, no exertional angina. Prior bypass. 3. Upper respiratory infection-currently on Flonase/Levaquin.  Signed, Donato Schultz, MD [facility]  09/08/2013 3:43 PM

## 2013-09-08 NOTE — Patient Instructions (Signed)
Your physician has recommended you make the following change in your medication:   1. Increase carvedilol  To 12.5 mg twice daily.  Your physician wants you to follow-up in: 6 months with Dr. Anne Fu. You will receive a reminder letter in the mail two months in advance. If you don't receive a letter, please call our office to schedule the follow-up appointment.

## 2013-09-14 ENCOUNTER — Other Ambulatory Visit: Payer: Self-pay | Admitting: Cardiology

## 2013-09-15 ENCOUNTER — Other Ambulatory Visit: Payer: Self-pay | Admitting: Internal Medicine

## 2013-09-15 NOTE — Telephone Encounter (Signed)
Pt called to say she is going out of town tomorrow AM and needs the refill today.

## 2013-10-06 ENCOUNTER — Ambulatory Visit: Payer: BC Managed Care – PPO | Admitting: Cardiology

## 2013-10-17 ENCOUNTER — Encounter (HOSPITAL_COMMUNITY): Payer: Self-pay | Admitting: Emergency Medicine

## 2013-10-17 ENCOUNTER — Emergency Department (HOSPITAL_COMMUNITY): Payer: BC Managed Care – PPO

## 2013-10-17 ENCOUNTER — Emergency Department (HOSPITAL_COMMUNITY)
Admission: EM | Admit: 2013-10-17 | Discharge: 2013-10-17 | Disposition: A | Discharge status: Home / Self Care | Payer: BC Managed Care – PPO | Attending: Emergency Medicine | Admitting: Emergency Medicine

## 2013-10-17 DIAGNOSIS — IMO0002 Reserved for concepts with insufficient information to code with codable children: Secondary | ICD-10-CM | POA: Insufficient documentation

## 2013-10-17 DIAGNOSIS — Z7982 Long term (current) use of aspirin: Secondary | ICD-10-CM | POA: Insufficient documentation

## 2013-10-17 DIAGNOSIS — I10 Essential (primary) hypertension: Secondary | ICD-10-CM

## 2013-10-17 DIAGNOSIS — I2510 Atherosclerotic heart disease of native coronary artery without angina pectoris: Secondary | ICD-10-CM

## 2013-10-17 DIAGNOSIS — J449 Chronic obstructive pulmonary disease, unspecified: Secondary | ICD-10-CM

## 2013-10-17 DIAGNOSIS — Z79899 Other long term (current) drug therapy: Secondary | ICD-10-CM | POA: Insufficient documentation

## 2013-10-17 DIAGNOSIS — J069 Acute upper respiratory infection, unspecified: Secondary | ICD-10-CM

## 2013-10-17 DIAGNOSIS — J4489 Other specified chronic obstructive pulmonary disease: Secondary | ICD-10-CM

## 2013-10-17 DIAGNOSIS — Z87891 Personal history of nicotine dependence: Secondary | ICD-10-CM | POA: Insufficient documentation

## 2013-10-17 DIAGNOSIS — R0682 Tachypnea, not elsewhere classified: Secondary | ICD-10-CM | POA: Insufficient documentation

## 2013-10-17 DIAGNOSIS — J40 Bronchitis, not specified as acute or chronic: Secondary | ICD-10-CM

## 2013-10-17 DIAGNOSIS — E785 Hyperlipidemia, unspecified: Secondary | ICD-10-CM | POA: Insufficient documentation

## 2013-10-17 DIAGNOSIS — Z951 Presence of aortocoronary bypass graft: Secondary | ICD-10-CM | POA: Insufficient documentation

## 2013-10-17 LAB — BASIC METABOLIC PANEL
CO2: 34 mEq/L — ABNORMAL HIGH (ref 19–32)
Calcium: 9.1 mg/dL (ref 8.4–10.5)
Creatinine, Ser: 0.45 mg/dL — ABNORMAL LOW (ref 0.50–1.10)
GFR calc non Af Amer: >90 mL/min (ref >90)
Glucose, Bld: 112 mg/dL — ABNORMAL HIGH (ref 70–99)
Sodium: 140 mEq/L (ref 135–145)

## 2013-10-17 LAB — CBC
Hemoglobin: 16.9 g/dL — ABNORMAL HIGH (ref 12.0–15.0)
MCH: 31.1 pg (ref 26.0–34.0)
MCHC: 33.9 g/dL (ref 30.0–36.0)
MCV: 91.7 fL (ref 78.0–100.0)

## 2013-10-17 LAB — POCT I-STAT TROPONIN I: Troponin i, poc: 0 ng/mL (ref 0.00–0.08)

## 2013-10-17 MED ORDER — ALBUTEROL SULFATE HFA 108 (90 BASE) MCG/ACT IN AERS: 1.0000 | INHALATION_SPRAY | Freq: Four times a day (QID) | RESPIRATORY_TRACT | Status: DC | PRN

## 2013-10-17 MED ORDER — PREDNISONE 50 MG PO TABS: ORAL_TABLET | ORAL | Status: DC

## 2013-10-17 MED ORDER — ALBUTEROL SULFATE HFA 108 (90 BASE) MCG/ACT IN AERS
2.0000 | INHALATION_SPRAY | Freq: Once | RESPIRATORY_TRACT | Status: AC
  Administered 2013-10-17: 2 via RESPIRATORY_TRACT
  Filled 2013-10-17: qty 6.7

## 2013-10-17 MED ORDER — IPRATROPIUM BROMIDE 0.02 % IN SOLN
0.5000 mg | RESPIRATORY_TRACT | Status: AC
  Administered 2013-10-17: 0.5 mg via RESPIRATORY_TRACT
  Filled 2013-10-17: qty 2.5

## 2013-10-17 MED ORDER — ALBUTEROL SULFATE (5 MG/ML) 0.5% IN NEBU
2.5000 mg | INHALATION_SOLUTION | RESPIRATORY_TRACT | Status: AC
  Administered 2013-10-17: 2.5 mg via RESPIRATORY_TRACT
  Filled 2013-10-17: qty 0.5

## 2013-10-17 MED ORDER — PREDNISONE 20 MG PO TABS
60.0000 mg | ORAL_TABLET | Freq: Once | ORAL | Status: AC
  Administered 2013-10-17: 60 mg via ORAL
  Filled 2013-10-17: qty 3

## 2013-10-17 NOTE — Consult Note (Signed)
[facility] History and Physical  SUANN KLIER OZH:[id number] DOB: May 10, 1956    PCP:   Oliver Barre, MD   Chief Complaint: shortness of breath.  HPI: [name] is an 57 y.o. female with hx of CAD s/p CABG, HTN, hyperlipidemia, exposed to her 2 yo son last week with viral illness, ? Influenza, presents to the ER with a nonproductive cough and shortness of breath.  She has a WBC of 10.9K , and CXR was clear, with some wheezing originally.  She was given oral prednisone and neb treatment, and felt better.  She was given oxygen, and with ambulation, her RA sat was 88 percent.  Hospitalist was asked to admit her because of her low saturation.  She does have hx of COPD with wheezing, and does have an inhaler.    Rewiew of Systems:  Constitutional: Negative for malaise, fever and chills. No significant weight loss or weight gain Eyes: Negative for eye pain, redness and discharge, diplopia, visual changes, or flashes of light. ENMT: Negative for ear pain, hoarseness, nasal congestion, sinus pressure and sore throat. No headaches; tinnitus, drooling, or problem swallowing. Cardiovascular: Negative for chest pain, palpitations, diaphoresis,  and peripheral edema. ; No orthopnea, PND Respiratory: Negative for cough, hemoptysis, wheezing and stridor. No pleuritic chestpain. Gastrointestinal: Negative for nausea, vomiting, diarrhea, constipation, abdominal pain, melena, blood in stool, hematemesis, jaundice and rectal bleeding.    Genitourinary: Negative for frequency, dysuria, incontinence,flank pain and hematuria; Musculoskeletal: Negative for back pain and neck pain. Negative for swelling and trauma.;  Skin: . Negative for pruritus, rash, abrasions, bruising and skin lesion.; ulcerations Neuro: Negative for headache, lightheadedness and neck stiffness. Negative for weakness, altered level of consciousness , altered mental status, extremity weakness, burning feet, involuntary movement,  seizure and syncope.  Psych: negative for anxiety, depression, insomnia, tearfulness, panic attacks, hallucinations, paranoia, suicidal or homicidal ideation.   Past Medical History  Diagnosis Date  . Coronary artery disease   . Bronchitis   . Sinusitis   . Hypertension   . Headache   . Heart disease   . Hyperlipidemia   . CAD (coronary artery disease) 09/03/2013    Past Surgical History  Procedure Laterality Date  . Coronary artery bypass graft  2003    Triple bypass    Medications:  HOME MEDS: Prior to Admission medications   Medication Sig Start Date End Date Taking? Authorizing Provider  amLODipine (NORVASC) 5 MG tablet Take 1 tablet (5 mg total) by mouth daily. 08/20/13  Yes Donato Schultz, MD  aspirin EC 81 MG tablet Take 1 tablet (81 mg total) by mouth daily. 07/02/13  Yes Wenda Low, MD  atorvastatin (LIPITOR) 40 MG tablet Take 1 tablet (40 mg total) by mouth daily at 6 PM. 08/20/13  Yes Donato Schultz, MD  carvedilol (COREG) 12.5 MG tablet Take 1 tablet (12.5 mg total) by mouth 2 (two) times daily. 09/08/13  Yes Donato Schultz, MD  fluticasone (FLONASE) 50 MCG/ACT nasal spray Place 2 sprays into both nostrils daily. 09/03/13  Yes Corwin Levins, MD  isosorbide mononitrate (IMDUR) 60 MG 24 hr tablet Take 1.5 tablets (90 mg total) by mouth daily. 08/20/13  Yes Donato Schultz, MD  PROVENTIL HFA 108 (90 BASE) MCG/ACT inhaler USE 2 PUFFS EVERY 6 HOURS AS NEEDED FOR WHEEZING 09/15/13  Yes Corwin Levins, MD     Allergies:  No Known Allergies  Social History:   reports that she quit smoking about 3 months ago. She does not have  any smokeless tobacco history on file. She reports that she drinks alcohol. She reports that she does not use illicit drugs.  Family History: Family History  Problem Relation Age of Onset  . Heart Problems Mother 44  . Heart disease Other   . Cancer Other     colon  . Stroke Other      Physical Exam: Filed Vitals:   10/17/13 1656 10/17/13 1818  10/17/13 1845 10/17/13 1958  BP: 111/63  104/50 127/66  Pulse: 77  71 71  Temp:    98.5 F (36.9 C)  TempSrc:    Oral  Resp: 21  28 18   Height:      Weight:      SpO2: 95% 90% 92% 91%   Blood pressure 127/66, pulse 71, temperature 98.5 F (36.9 C), temperature source Oral, resp. rate 18, height 5\' 1"  (1.549 m), weight 73.511 kg (162 lb 1 oz), SpO2 91.00%.  GEN:  Pleasant  patient lying in the stretcher in no acute distress; cooperative with exam. PSYCH:  alert and oriented x4; does not appear anxious or depressed; affect is appropriate. HEENT: Mucous membranes pink and anicteric; PERRLA; EOM intact; no cervical lymphadenopathy nor thyromegaly or carotid bruit; no JVD; There were no stridor. Neck is very supple. Breasts:: Not examined CHEST WALL: No tenderness CHEST: Normal respiration, clear to auscultation bilaterally.  HEART: Regular rate and rhythm.  There are no murmur, rub, or gallops.   BACK: No kyphosis or scoliosis; no CVA tenderness ABDOMEN: soft and non-tender; no masses, no organomegaly, normal abdominal bowel sounds; no pannus; no intertriginous candida. There is no rebound and no distention. Rectal Exam: Not done EXTREMITIES: No bone or joint deformity; age-appropriate arthropathy of the hands and knees; no edema; no ulcerations.  There is no calf tenderness. Genitalia: not examined PULSES: 2+ and symmetric SKIN: Normal hydration no rash or ulceration CNS: Cranial nerves 2-12 grossly intact no focal lateralizing neurologic deficit.  Speech is fluent; uvula elevated with phonation, facial symmetry and tongue midline. DTR are normal bilaterally, cerebella exam is intact, barbinski is negative and strengths are equaled bilaterally.  No sensory loss.   Labs on Admission:  Basic Metabolic Panel:  Recent Labs Lab 10/17/13 1653  NA 140  K 4.0  CL 101  CO2 34*  GLUCOSE 112*  BUN 9  CREATININE 0.45*  CALCIUM 9.1   Liver Function Tests: No results found for this  basename: AST, ALT, ALKPHOS, BILITOT, PROT, ALBUMIN,  in the last 168 hours No results found for this basename: LIPASE, AMYLASE,  in the last 168 hours No results found for this basename: AMMONIA,  in the last 168 hours CBC:  Recent Labs Lab 10/17/13 1653  WBC 10.9*  HGB 16.9*  HCT 49.9*  MCV 91.7  PLT 182   Cardiac Enzymes: No results found for this basename: CKTOTAL, CKMB, CKMBINDEX, TROPONINI,  in the last 168 hours  CBG: No results found for this basename: GLUCAP,  in the last 168 hours   Radiological Exams on Admission: Dg Chest 2 View  10/17/2013   CLINICAL DATA:  Cough and shortness of breath  EXAM: CHEST  2 VIEW  COMPARISON:  June 30, 2013  FINDINGS: There is no edema or consolidation. The heart size and pulmonary vascularity are normal. No adenopathy. Patient is status post coronary artery bypass grafting.  IMPRESSION: No edema or consolidation.   Electronically Signed   By: Bretta Bang M.D.   On: 10/17/2013 17:23   Assessment/Plan Viral syndrome ?  influenza. COPD HTN CAD s/p CABG.  PLAN:  She has a viral syndrome and after resp tx, she felt markedly better.  She is now 99 percent oxygen sat at rest on RA.  She doesn't wheeze and is doing well.  She would like to go home.  I think it is reasonable to discharge her with strict recommendations for return to the ER criteria, which I discussed, considering how well she looks.  I recommend that she wears a facemask, finish her Prednisone taper, and start Tamiflu 75mg  BID for 5 days pending influenza PCR.  She should follow up with her PCP, Dr Oliver Barre.  Discussed with EDP as well as resident. Thank you for asking me to consult on her.  Other plans as per orders.  Code Status:  FULL Unk Lightning, MD. [facility] Pager [phone] 7pm to 7am.  10/17/2013, 10:09 PM

## 2013-10-17 NOTE — ED Notes (Signed)
Pt c/o increased SOB and cough x 3 days; pt sts hx of COPD and CHF

## 2013-10-17 NOTE — ED Notes (Signed)
Pt O2 dropped to 89% on room air pt was placed on 2L of O2 pt oxygen saturation is now 95%

## 2013-10-17 NOTE — ED Notes (Signed)
Pt returned from xray

## 2013-10-17 NOTE — ED Provider Notes (Signed)
CSN: [id number]     Arrival date & time 10/17/13  1604 History   First MD Initiated Contact with Patient 10/17/13 1725     Chief Complaint  Patient presents with  . Shortness of Breath  . Cough   HPI  57 y/o female with history as noted below who presents with cc of shortness of breath, cough, wheezing and congestion. Symptoms began 3 days ago. She states she has had to use her rescue inhaler every 2 hours for the last 24 hours. She denies any chest pain. She has no risk factors for PE. She has had a productive cough of yellow sputum. She has had a sick contact (family member) with similar symptoms.   Past Medical History  Diagnosis Date  . Coronary artery disease   . Bronchitis   . Sinusitis   . Hypertension   . Headache   . Heart disease   . Hyperlipidemia   . CAD (coronary artery disease) 09/03/2013   Past Surgical History  Procedure Laterality Date  . Coronary artery bypass graft  2003    Triple bypass   Family History  Problem Relation Age of Onset  . Heart Problems Mother 41  . Heart disease Other   . Cancer Other     colon  . Stroke Other    History  Substance Use Topics  . Smoking status: Former Smoker -- 0.50 packs/day    Quit date: 06/30/2013  . Smokeless tobacco: Not on file  . Alcohol Use: Yes     Comment: occasional   OB History   Grav Para Term Preterm Abortions TAB SAB Ect Mult Living                 Review of Systems  Constitutional: Negative for fever and chills.  HENT: Positive for congestion.   Respiratory: Positive for cough, shortness of breath and wheezing.   Cardiovascular: Negative for chest pain and leg swelling.  Gastrointestinal: Negative for nausea, vomiting and abdominal pain.  Genitourinary: Negative for dysuria and frequency.  All other systems reviewed and are negative.    Allergies  Review of patient's allergies indicates no known allergies.  Home Medications   Current Outpatient Rx  Name  Route  Sig  Dispense  Refill   . amLODipine (NORVASC) 5 MG tablet   Oral   Take 1 tablet (5 mg total) by mouth daily.   90 tablet   3   . aspirin EC 81 MG tablet   Oral   Take 1 tablet (81 mg total) by mouth daily.   30 tablet   0   . atorvastatin (LIPITOR) 40 MG tablet   Oral   Take 1 tablet (40 mg total) by mouth daily at 6 PM.   30 tablet   6   . carvedilol (COREG) 12.5 MG tablet   Oral   Take 1 tablet (12.5 mg total) by mouth 2 (two) times daily.   60 tablet   5   . fluticasone (FLONASE) 50 MCG/ACT nasal spray   Each Nare   Place 2 sprays into both nostrils daily.   16 g   5   . isosorbide mononitrate (IMDUR) 60 MG 24 hr tablet   Oral   Take 1.5 tablets (90 mg total) by mouth daily.   45 tablet   11   . PROVENTIL HFA 108 (90 BASE) MCG/ACT inhaler      USE 2 PUFFS EVERY 6 HOURS AS NEEDED FOR WHEEZING   6.7 each  6   . albuterol (PROVENTIL HFA;VENTOLIN HFA) 108 (90 BASE) MCG/ACT inhaler   Inhalation   Inhale 1-2 puffs into the lungs every 6 (six) hours as needed for wheezing or shortness of breath.   1 Inhaler   0   . predniSONE (DELTASONE) 50 MG tablet      One tablet PO daily for 4 days   4 tablet   0    BP 126/64  Pulse 77  Temp(Src) 98.5 F (36.9 C) (Oral)  Resp 20  Ht 5\' 1"  (1.549 m)  Wt 162 lb 1 oz (73.511 kg)  BMI 30.64 kg/m2  SpO2 97% Physical Exam  Nursing note and vitals reviewed. Constitutional: She is oriented to person, place, and time. She appears well-developed and well-nourished. No distress.  HENT:  Head: Normocephalic and atraumatic.  Eyes: Conjunctivae are normal. Pupils are equal, round, and reactive to light.  Neck: Normal range of motion. Neck supple.  Cardiovascular: Normal rate and regular rhythm.  Exam reveals no gallop and no friction rub.   No murmur heard. Pulmonary/Chest: Tachypnea (mild) noted. She has decreased breath sounds (diffuse).  Abdominal: Soft. She exhibits no distension. There is no tenderness.  Musculoskeletal: Normal range of  motion. She exhibits no edema and no tenderness.  Neurological: She is alert and oriented to person, place, and time. She has normal strength and normal reflexes. No cranial nerve deficit or sensory deficit.  Skin: Skin is warm and dry.  Psychiatric: She has a normal mood and affect.    ED Course  Procedures (including critical care time) Labs Review Labs Reviewed  CBC - Abnormal; Notable for the following:    WBC 10.9 (*)    RBC 5.44 (*)    Hemoglobin 16.9 (*)    HCT 49.9 (*)    All other components within normal limits  BASIC METABOLIC PANEL - Abnormal; Notable for the following:    CO2 34 (*)    Glucose, Bld 112 (*)    Creatinine, Ser 0.45 (*)    All other components within normal limits  PRO B NATRIURETIC PEPTIDE - Abnormal; Notable for the following:    Pro B Natriuretic peptide (BNP) 546.4 (*)    All other components within normal limits  POCT I-STAT TROPONIN I   Imaging Review Dg Chest 2 View  10/17/2013   CLINICAL DATA:  Cough and shortness of breath  EXAM: CHEST  2 VIEW  COMPARISON:  June 30, 2013  FINDINGS: There is no edema or consolidation. The heart size and pulmonary vascularity are normal. No adenopathy. Patient is status post coronary artery bypass grafting.  IMPRESSION: No edema or consolidation.   Electronically Signed   By: Bretta Bang M.D.   On: 10/17/2013 17:23    EKG Interpretation    Date/Time:  Sunday October 17 2013 16:24:51 EST Ventricular Rate:  82 PR Interval:  156 QRS Duration: 74 QT Interval:  382 QTC Calculation: 446 R Axis:   72 Text Interpretation:  Normal sinus rhythm Nonspecific ST abnormality Abnormal ECG Confirmed by Bebe Shaggy  MD, DONALD [phone]) on 10/17/2013 4:47:32 PM           MDM   57 y/o female with history as noted below who presents with cc of shortness of breath, cough, wheezing and congestion which began 3 days ago. Diminshed breath sounds throughout. History of COPD but no home oxygen currently. Initial O2  sat of 90% on RA. She was given prednisone and a duoneb with improvement of her symptoms.  CXR without evidence of pneumonia. BNP elevated but do not suspect heart failure given no lower extremity edema, JVD and no pulmonary edema on CXR. Troponin and EKG both WNL. Doubt PE given concurrent upper respiratory symptoms c/w infection. Given persistent low O2 sats and slight hypoxia (high 80's) while walking internal medicine was consulted for admission but upon the hospitalist evaluation her O2 sats were better and she felt like she as back to her baseline. The patient was discharged with an albuterol inhaler and prednisone burst. Will defer flu tx at this time given no high fevers or other flu like symptoms and due to her illness has been going on for more than 3 days. Return precautions given and discussed with the patient who was in agreement with the plan.    1. Acute upper respiratory infections of unspecified site   2. CAD (coronary artery disease)   3. COPD (chronic obstructive pulmonary disease) with chronic bronchitis   4. Essential hypertension   5. Bronchitis        Shanon Ace, MD 10/18/13 [phone]

## 2013-10-17 NOTE — ED Notes (Signed)
Pt states shes been having a chest cold, trouble breathing, coughing up phlegm for the last couple days and lightheadedness, wheezing. Denies CP, N/V/D, abdominal pain.

## 2013-10-17 NOTE — ED Notes (Signed)
Respiratory notified about breathing treatment

## 2013-10-17 NOTE — ED Notes (Signed)
Ambulated pt on room air.  Oxygen sats were at 87% to 91% during ambulation.  Returned patient to room and oxygen saturation was 87%, put pt back on 2l nasal canula and oxygen level is sitting at 92%

## 2013-10-18 ENCOUNTER — Telehealth: Payer: Self-pay | Admitting: Surgery

## 2013-10-18 ENCOUNTER — Telehealth: Payer: Self-pay | Admitting: *Deleted

## 2013-10-18 NOTE — Telephone Encounter (Signed)
Message copied by Michel Bickers on Mon Oct 18, 2013  5:20 PM ------      Message from: Zadie Rhine      Created: Sun Oct 17, 2013 10:29 PM      Regarding: Order for JONNY, LONGINO             Patient Name: [name], [name]([id number])      Sex: Female      DOB: 10/07/1956              PCP: Corwin Levins        Center: [facility]             Types of orders made on 10/17/2013: Consult, ECG, Imaging, Lab, Medications,                                           Point of Care Testing, Point of Care                                           Testing-Docked Device            Order Date:10/17/2013      Ordering User:WICKLINE, [place] [[id number]]      Attending Provider:Donald Forestine Chute, MD [phone]      Authorizing Provider: Joya Gaskins, MD [phone]      Department:[facility][[id number]]            Order Specific Information      Order: COPD clinic follow up [Custom: CON4000]  Order #: [id number]  Qty: 1        Priority: Routine  Class: Hospital Performed          Where does the patient receive follow up COPD care -> Other                 Follow up in -> 5-7 days               Released on: 10/17/2013 10:29 PM                    Priority: Routine  Class: Hospital Performed          Where does the patient receive follow up COPD care -> Other                 Follow up in -> 5-7 days               Released on: 10/17/2013 10:29 PM       ------

## 2013-10-18 NOTE — Telephone Encounter (Signed)
[facility] Triage Call Report Triage Record Num: [id number] Operator: Karenann Cai Patient Name: [name] Call Date & Time: 10/17/2013 3:12:00PM Patient Phone: [phone] PCP: Oliver Barre Patient Gender: Female PCP Fax : [phone] Patient DOB: March 24, 1956 Practice Name: [facility] Reason for Call: Caller: [name]/Patient; PCP: Oliver Barre (Adults only); CB#: [phone]; reason for call: cough and congestion. Onset: 48 hours ago. Patient reports her chest "feels tight and difficulty breathing". Afebrile. RN advised patient to have her Son take her to [facility] ER for evaluation now due to answering yes to worsening breathing problems and known cardiac and/or respiratory condition. Patient agreed. Protocol(s) Used: Breathing Problems Recommended Outcome per Protocol: Activate EMS 911 Override Outcome if Used in Protocol: See ED Immediately RN Reason for Override Outcome: Nursing Judgement Used. Reason for Outcome: Worsening breathing problems AND known cardiac or respiratory condition (coronary artery disease, angina, heart failure, asthma, chronic bronchitis, COPD) NOT responding to treatment OR treatment not available. Care Advice: ~

## 2013-10-19 NOTE — ED Provider Notes (Signed)
I have personally seen and examined the patient.  I have discussed the plan of care with the resident.  I have reviewed the documentation on PMH/FH/Soc. History.  I have reviewed the documentation of the resident and agree.  I have reviewed and agree with the ECG interpretation(s) documented by the resident.  Initially we decided to call hospitalist for admission. By the time of evaluation, pt was improving, and reports to me that she felt at baseline while walking.  She would like to go home.  When I was in room, RA pulse ox was between 90-94% which I suspect is around her baseline.  She was in no distress Pt discharged home with strict return precautions  Joya Gaskins, MD 10/19/13 [phone]

## 2013-11-04 ENCOUNTER — Encounter: Payer: Self-pay | Admitting: Internal Medicine

## 2013-12-29 ENCOUNTER — Other Ambulatory Visit: Payer: Self-pay

## 2013-12-29 MED ORDER — ALBUTEROL SULFATE HFA 108 (90 BASE) MCG/ACT IN AERS: 1.0000 | INHALATION_SPRAY | Freq: Four times a day (QID) | RESPIRATORY_TRACT | Status: DC | PRN

## 2014-02-01 ENCOUNTER — Encounter (HOSPITAL_COMMUNITY): Payer: Self-pay | Admitting: Emergency Medicine

## 2014-02-01 ENCOUNTER — Inpatient Hospital Stay (HOSPITAL_COMMUNITY): Payer: BC Managed Care – PPO

## 2014-02-01 ENCOUNTER — Inpatient Hospital Stay (HOSPITAL_COMMUNITY)
Admission: EM | Admit: 2014-02-01 | Discharge: 2014-02-02 | DRG: 189 | Disposition: A | Discharge status: Home / Self Care | Payer: BC Managed Care – PPO | Attending: Internal Medicine | Admitting: Internal Medicine

## 2014-02-01 ENCOUNTER — Ambulatory Visit (INDEPENDENT_AMBULATORY_CARE_PROVIDER_SITE_OTHER): Payer: BC Managed Care – PPO | Admitting: Internal Medicine

## 2014-02-01 ENCOUNTER — Encounter: Payer: Self-pay | Admitting: Internal Medicine

## 2014-02-01 ENCOUNTER — Emergency Department (HOSPITAL_COMMUNITY): Payer: BC Managed Care – PPO

## 2014-02-01 VITALS — BP 122/76 | HR 65 | Temp 97.7°F | Wt 157.4 lb

## 2014-02-01 DIAGNOSIS — J4489 Other specified chronic obstructive pulmonary disease: Secondary | ICD-10-CM | POA: Diagnosis present

## 2014-02-01 DIAGNOSIS — I161 Hypertensive emergency: Secondary | ICD-10-CM

## 2014-02-01 DIAGNOSIS — D45 Polycythemia vera: Secondary | ICD-10-CM

## 2014-02-01 DIAGNOSIS — R911 Solitary pulmonary nodule: Secondary | ICD-10-CM

## 2014-02-01 DIAGNOSIS — I10 Essential (primary) hypertension: Secondary | ICD-10-CM | POA: Diagnosis present

## 2014-02-01 DIAGNOSIS — J9601 Acute respiratory failure with hypoxia: Secondary | ICD-10-CM

## 2014-02-01 DIAGNOSIS — I2510 Atherosclerotic heart disease of native coronary artery without angina pectoris: Secondary | ICD-10-CM | POA: Diagnosis present

## 2014-02-01 DIAGNOSIS — D751 Secondary polycythemia: Secondary | ICD-10-CM

## 2014-02-01 DIAGNOSIS — J441 Chronic obstructive pulmonary disease with (acute) exacerbation: Secondary | ICD-10-CM | POA: Diagnosis present

## 2014-02-01 DIAGNOSIS — J069 Acute upper respiratory infection, unspecified: Secondary | ICD-10-CM

## 2014-02-01 DIAGNOSIS — R0902 Hypoxemia: Secondary | ICD-10-CM

## 2014-02-01 DIAGNOSIS — I509 Heart failure, unspecified: Secondary | ICD-10-CM | POA: Diagnosis present

## 2014-02-01 DIAGNOSIS — J9600 Acute respiratory failure, unspecified whether with hypoxia or hypercapnia: Principal | ICD-10-CM | POA: Diagnosis present

## 2014-02-01 DIAGNOSIS — J449 Chronic obstructive pulmonary disease, unspecified: Secondary | ICD-10-CM

## 2014-02-01 DIAGNOSIS — R51 Headache: Secondary | ICD-10-CM

## 2014-02-01 DIAGNOSIS — G4710 Hypersomnia, unspecified: Secondary | ICD-10-CM

## 2014-02-01 DIAGNOSIS — J309 Allergic rhinitis, unspecified: Secondary | ICD-10-CM

## 2014-02-01 DIAGNOSIS — Z0000 Encounter for general adult medical examination without abnormal findings: Secondary | ICD-10-CM

## 2014-02-01 DIAGNOSIS — J9621 Acute and chronic respiratory failure with hypoxia: Secondary | ICD-10-CM | POA: Insufficient documentation

## 2014-02-01 DIAGNOSIS — K219 Gastro-esophageal reflux disease without esophagitis: Secondary | ICD-10-CM | POA: Diagnosis present

## 2014-02-01 DIAGNOSIS — Z951 Presence of aortocoronary bypass graft: Secondary | ICD-10-CM

## 2014-02-01 DIAGNOSIS — Z87891 Personal history of nicotine dependence: Secondary | ICD-10-CM

## 2014-02-01 DIAGNOSIS — E785 Hyperlipidemia, unspecified: Secondary | ICD-10-CM | POA: Diagnosis present

## 2014-02-01 HISTORY — DX: Heart failure, unspecified: I50.9

## 2014-02-01 LAB — URINE MICROSCOPIC-ADD ON

## 2014-02-01 LAB — URINALYSIS, ROUTINE W REFLEX MICROSCOPIC
BILIRUBIN URINE: NEGATIVE
Glucose, UA: NEGATIVE mg/dL
Ketones, ur: NEGATIVE mg/dL
Leukocytes, UA: NEGATIVE
NITRITE: NEGATIVE
PH: 5.5 (ref 5.0–8.0)
Protein, ur: NEGATIVE mg/dL
Specific Gravity, Urine: 1.01 (ref 1.005–1.030)
Urobilinogen, UA: 0.2 mg/dL (ref 0.0–1.0)

## 2014-02-01 LAB — TSH: TSH: 0.77 u[IU]/mL (ref 0.350–4.500)

## 2014-02-01 LAB — BASIC METABOLIC PANEL
BUN: 12 mg/dL (ref 6–23)
CO2: 31 mEq/L (ref 19–32)
Calcium: 9.3 mg/dL (ref 8.4–10.5)
Chloride: 99 mEq/L (ref 96–112)
Creatinine, Ser: 0.46 mg/dL — ABNORMAL LOW (ref 0.50–1.10)
GFR calc Af Amer: >90 mL/min (ref >90)
GFR calc non Af Amer: >90 mL/min (ref >90)
GLUCOSE: 100 mg/dL — AB (ref 70–99)
Potassium: 4.6 mEq/L (ref 3.7–5.3)
Sodium: 143 mEq/L (ref 137–147)

## 2014-02-01 LAB — I-STAT ARTERIAL BLOOD GAS, ED
Acid-Base Excess: 2 mmol/L (ref 0.0–2.0)
Bicarbonate: 31.7 mEq/L — ABNORMAL HIGH (ref 20.0–24.0)
O2 Saturation: 92 %
PCO2 ART: 66.4 mmHg — AB (ref 35.0–45.0)
PO2 ART: 75 mmHg — AB (ref 80.0–100.0)
TCO2: 34 mmol/L (ref 0–100)
pH, Arterial: 7.287 — ABNORMAL LOW (ref 7.350–7.450)

## 2014-02-01 LAB — PRO B NATRIURETIC PEPTIDE: Pro B Natriuretic peptide (BNP): 485.2 pg/mL — ABNORMAL HIGH (ref 0–125)

## 2014-02-01 LAB — CBC
HEMATOCRIT: 54 % — AB (ref 36.0–46.0)
HEMOGLOBIN: 17.6 g/dL — AB (ref 12.0–15.0)
MCH: 29.3 pg (ref 26.0–34.0)
MCHC: 32.6 g/dL (ref 30.0–36.0)
MCV: 89.9 fL (ref 78.0–100.0)
Platelets: 180 10*3/uL (ref 150–400)
RBC: 6.01 MIL/uL — AB (ref 3.87–5.11)
RDW: 14.3 % (ref 11.5–15.5)
WBC: 7.9 10*3/uL (ref 4.0–10.5)

## 2014-02-01 LAB — I-STAT TROPONIN, ED: TROPONIN I, POC: 0 ng/mL (ref 0.00–0.08)

## 2014-02-01 LAB — D-DIMER, QUANTITATIVE (NOT AT ARMC): D DIMER QUANT: 0.69 ug{FEU}/mL — AB (ref 0.00–0.48)

## 2014-02-01 LAB — MAGNESIUM: MAGNESIUM: 2 mg/dL (ref 1.5–2.5)

## 2014-02-01 MED ORDER — SODIUM CHLORIDE 0.9 % IJ SOLN
3.0000 mL | Freq: Two times a day (BID) | INTRAMUSCULAR | Status: DC
  Administered 2014-02-02: 3 mL via INTRAVENOUS

## 2014-02-01 MED ORDER — ASPIRIN EC 81 MG PO TBEC
81.0000 mg | DELAYED_RELEASE_TABLET | Freq: Every day | ORAL | Status: DC
  Administered 2014-02-01 – 2014-02-02 (×2): 81 mg via ORAL
  Filled 2014-02-01 (×2): qty 1

## 2014-02-01 MED ORDER — CEFTRIAXONE SODIUM 1 G IJ SOLR
1.0000 g | Freq: Once | INTRAMUSCULAR | Status: AC
  Administered 2014-02-01: 1 g via INTRAMUSCULAR

## 2014-02-01 MED ORDER — IPRATROPIUM BROMIDE 0.02 % IN SOLN: 0.5000 mg | RESPIRATORY_TRACT | Status: DC | PRN

## 2014-02-01 MED ORDER — ACETAMINOPHEN 325 MG PO TABS: 650.0000 mg | ORAL_TABLET | Freq: Four times a day (QID) | ORAL | Status: DC | PRN

## 2014-02-01 MED ORDER — IPRATROPIUM BROMIDE 0.02 % IN SOLN
0.5000 mg | Freq: Once | RESPIRATORY_TRACT | Status: AC
  Administered 2014-02-01: 0.5 mg via RESPIRATORY_TRACT
  Filled 2014-02-01: qty 2.5

## 2014-02-01 MED ORDER — SORBITOL 70 % SOLN
30.0000 mL | Freq: Every day | Status: DC | PRN
  Filled 2014-02-01: qty 30

## 2014-02-01 MED ORDER — ISOSORBIDE MONONITRATE ER 60 MG PO TB24
90.0000 mg | ORAL_TABLET | Freq: Every day | ORAL | Status: DC
  Administered 2014-02-01 – 2014-02-02 (×2): 90 mg via ORAL
  Filled 2014-02-01 (×2): qty 1

## 2014-02-01 MED ORDER — ALBUTEROL SULFATE (2.5 MG/3ML) 0.083% IN NEBU
2.5000 mg | INHALATION_SOLUTION | Freq: Four times a day (QID) | RESPIRATORY_TRACT | Status: DC
  Administered 2014-02-01: 2.5 mg via RESPIRATORY_TRACT
  Filled 2014-02-01: qty 3

## 2014-02-01 MED ORDER — PANTOPRAZOLE SODIUM 40 MG PO TBEC
40.0000 mg | DELAYED_RELEASE_TABLET | Freq: Every day | ORAL | Status: DC
  Administered 2014-02-02: 40 mg via ORAL
  Filled 2014-02-01: qty 1

## 2014-02-01 MED ORDER — LEVOFLOXACIN IN D5W 500 MG/100ML IV SOLN
500.0000 mg | INTRAVENOUS | Status: DC
  Administered 2014-02-01: 500 mg via INTRAVENOUS
  Filled 2014-02-01: qty 100

## 2014-02-01 MED ORDER — IPRATROPIUM BROMIDE 0.02 % IN SOLN
0.5000 mg | Freq: Four times a day (QID) | RESPIRATORY_TRACT | Status: DC
  Administered 2014-02-01: 0.5 mg via RESPIRATORY_TRACT
  Filled 2014-02-01: qty 2.5

## 2014-02-01 MED ORDER — METHYLPREDNISOLONE SODIUM SUCC 125 MG IJ SOLR
125.0000 mg | Freq: Once | INTRAMUSCULAR | Status: AC
  Administered 2014-02-01: 125 mg via INTRAVENOUS
  Filled 2014-02-01: qty 2

## 2014-02-01 MED ORDER — ALBUTEROL SULFATE (2.5 MG/3ML) 0.083% IN NEBU
2.5000 mg | INHALATION_SOLUTION | Freq: Three times a day (TID) | RESPIRATORY_TRACT | Status: DC
  Administered 2014-02-02: 2.5 mg via RESPIRATORY_TRACT
  Filled 2014-02-01: qty 3

## 2014-02-01 MED ORDER — FUROSEMIDE 10 MG/ML IJ SOLN: 40.0000 mg | Freq: Once | INTRAMUSCULAR | Status: DC

## 2014-02-01 MED ORDER — SODIUM CHLORIDE 0.9 % IV SOLN
INTRAVENOUS | Status: DC
  Administered 2014-02-02: 01:00:00 via INTRAVENOUS

## 2014-02-01 MED ORDER — OXYCODONE HCL 5 MG PO TABS: 5.0000 mg | ORAL_TABLET | ORAL | Status: DC | PRN

## 2014-02-01 MED ORDER — SODIUM CHLORIDE 0.9 % IV SOLN
INTRAVENOUS | Status: DC
  Administered 2014-02-01: 18:00:00 via INTRAVENOUS

## 2014-02-01 MED ORDER — METHYLPREDNISOLONE SODIUM SUCC 125 MG IJ SOLR
125.0000 mg | Freq: Four times a day (QID) | INTRAMUSCULAR | Status: DC
  Administered 2014-02-02 (×2): 125 mg via INTRAVENOUS
  Filled 2014-02-01 (×6): qty 2

## 2014-02-01 MED ORDER — ALBUTEROL SULFATE (2.5 MG/3ML) 0.083% IN NEBU
5.0000 mg | INHALATION_SOLUTION | Freq: Once | RESPIRATORY_TRACT | Status: AC
  Administered 2014-02-01: 5 mg via RESPIRATORY_TRACT
  Filled 2014-02-01: qty 6

## 2014-02-01 MED ORDER — IPRATROPIUM BROMIDE 0.02 % IN SOLN: 0.5000 mg | Freq: Three times a day (TID) | RESPIRATORY_TRACT | Status: DC

## 2014-02-01 MED ORDER — FLUTICASONE PROPIONATE 50 MCG/ACT NA SUSP
2.0000 | Freq: Every day | NASAL | Status: DC
  Administered 2014-02-02: 2 via NASAL
  Filled 2014-02-01: qty 16

## 2014-02-01 MED ORDER — ALBUTEROL (5 MG/ML) CONTINUOUS INHALATION SOLN
10.0000 mg/h | INHALATION_SOLUTION | RESPIRATORY_TRACT | Status: AC
  Administered 2014-02-01: 10 mg/h via RESPIRATORY_TRACT
  Filled 2014-02-01: qty 20

## 2014-02-01 MED ORDER — AMLODIPINE BESYLATE 5 MG PO TABS
5.0000 mg | ORAL_TABLET | Freq: Every day | ORAL | Status: DC
  Administered 2014-02-01 – 2014-02-02 (×2): 5 mg via ORAL
  Filled 2014-02-01 (×2): qty 1

## 2014-02-01 MED ORDER — ACETAMINOPHEN 500 MG PO TABS: 1000.0000 mg | ORAL_TABLET | Freq: Every evening | ORAL | Status: DC | PRN

## 2014-02-01 MED ORDER — CARVEDILOL 12.5 MG PO TABS
12.5000 mg | ORAL_TABLET | Freq: Two times a day (BID) | ORAL | Status: DC
  Administered 2014-02-01 – 2014-02-02 (×2): 12.5 mg via ORAL
  Filled 2014-02-01 (×4): qty 1

## 2014-02-01 MED ORDER — ONDANSETRON HCL 4 MG/2ML IJ SOLN: 4.0000 mg | Freq: Four times a day (QID) | INTRAMUSCULAR | Status: DC | PRN

## 2014-02-01 MED ORDER — MAGNESIUM CITRATE PO SOLN: 1.0000 | Freq: Once | ORAL | Status: AC | PRN

## 2014-02-01 MED ORDER — ENOXAPARIN SODIUM 40 MG/0.4ML ~~LOC~~ SOLN
40.0000 mg | SUBCUTANEOUS | Status: DC
  Administered 2014-02-01: 40 mg via SUBCUTANEOUS
  Filled 2014-02-01 (×2): qty 0.4

## 2014-02-01 MED ORDER — ACETAMINOPHEN 650 MG RE SUPP: 650.0000 mg | Freq: Four times a day (QID) | RECTAL | Status: DC | PRN

## 2014-02-01 MED ORDER — NICOTINE 21 MG/24HR TD PT24
21.0000 mg | MEDICATED_PATCH | Freq: Every day | TRANSDERMAL | Status: DC
  Administered 2014-02-01 – 2014-02-02 (×2): 21 mg via TRANSDERMAL
  Filled 2014-02-01 (×2): qty 1

## 2014-02-01 MED ORDER — ALBUTEROL SULFATE (2.5 MG/3ML) 0.083% IN NEBU: 2.5000 mg | INHALATION_SOLUTION | RESPIRATORY_TRACT | Status: DC | PRN

## 2014-02-01 MED ORDER — POLYETHYLENE GLYCOL 3350 17 G PO PACK
17.0000 g | PACK | Freq: Every day | ORAL | Status: DC | PRN
  Filled 2014-02-01: qty 1

## 2014-02-01 MED ORDER — ATORVASTATIN CALCIUM 40 MG PO TABS
40.0000 mg | ORAL_TABLET | Freq: Every day | ORAL | Status: DC
  Administered 2014-02-01: 40 mg via ORAL
  Filled 2014-02-01 (×2): qty 1

## 2014-02-01 MED ORDER — IOHEXOL 350 MG/ML SOLN
100.0000 mL | Freq: Once | INTRAVENOUS | Status: AC | PRN
  Administered 2014-02-01: 100 mL via INTRAVENOUS

## 2014-02-01 MED ORDER — ALUM & MAG HYDROXIDE-SIMETH 200-200-20 MG/5ML PO SUSP: 30.0000 mL | Freq: Four times a day (QID) | ORAL | Status: DC | PRN

## 2014-02-01 MED ORDER — ONDANSETRON HCL 4 MG PO TABS: 4.0000 mg | ORAL_TABLET | Freq: Four times a day (QID) | ORAL | Status: DC | PRN

## 2014-02-01 NOTE — ED Notes (Signed)
Pt reports fatigue, fever/chills, sob x 2-3 days. Went to pcp office and sent here due to spo2 86%. Denies swelling to extremities. Pt does have cardiac history. spo2 89% on room air at triage, 98% on 2L. ekg done.

## 2014-02-01 NOTE — ED Notes (Signed)
Patient pulse ox dropped to 83% on room air, post neb treatment.  Patient is alert.  MD now at bedside.  Patient placed on oxygen via nasal canula 3liters/min.  Pulse ox improved to 95%.  MD has informed patient that she is to be admitted.

## 2014-02-01 NOTE — Addendum Note (Signed)
Addended by: Sharon Seller B on: 02/01/2014 01:27 PM   Modules accepted: Orders

## 2014-02-01 NOTE — Assessment & Plan Note (Signed)
Onset prior to acute - ? OSA, consider pulm referral

## 2014-02-01 NOTE — ED Provider Notes (Signed)
Medical screening examination/treatment/procedure(s) were conducted as a shared visit with non-physician practitioner(s) and myself.  I personally evaluated the patient during the encounter.   EKG Interpretation   Date/Time:  Tuesday February 01 2014 12:49:36 EDT Ventricular Rate:  66 PR Interval:  204 QRS Duration: 70 QT Interval:  412 QTC Calculation: 431 R Axis:   93 Text Interpretation:  Normal sinus rhythm with sinus arrhythmia Rightward  axis ST \\T \ T wave abnormality, consider anterior ischemia Abnormal ECG No  significant change since last tracing Confirmed by Baer Hinton  MD, Fatih Stalvey  [phone]) on 02/01/2014 4:19:49 PM      Results for orders placed during the hospital encounter of 02/01/14  CBC      Result Value Ref Range   WBC 7.9  4.0 - 10.5 K/uL   RBC 6.01 (*) 3.87 - 5.11 MIL/uL   Hemoglobin 17.6 (*) 12.0 - 15.0 g/dL   HCT 54.0 (*) 36.0 - 46.0 %   MCV 89.9  78.0 - 100.0 fL   MCH 29.3  26.0 - 34.0 pg   MCHC 32.6  30.0 - 36.0 g/dL   RDW 14.3  11.5 - 15.5 %   Platelets 180  150 - 400 K/uL  BASIC METABOLIC PANEL      Result Value Ref Range   Sodium 143  137 - 147 mEq/L   Potassium 4.6  3.7 - 5.3 mEq/L   Chloride 99  96 - 112 mEq/L   CO2 31  19 - 32 mEq/L   Glucose, Bld 100 (*) 70 - 99 mg/dL   BUN 12  6 - 23 mg/dL   Creatinine, Ser 0.46 (*) 0.50 - 1.10 mg/dL   Calcium 9.3  8.4 - 10.5 mg/dL   GFR calc non Af Amer >90  >90 mL/min   GFR calc Af Amer >90  >90 mL/min  PRO B NATRIURETIC PEPTIDE      Result Value Ref Range   Pro B Natriuretic peptide (BNP) 485.2 (*) 0 - 125 pg/mL  D-DIMER, QUANTITATIVE      Result Value Ref Range   D-Dimer, Quant 0.69 (*) 0.00 - 0.48 ug/mL-FEU  I-STAT TROPOININ, ED      Result Value Ref Range   Troponin i, poc 0.00  0.00 - 0.08 ng/mL   Comment 3            Dg Chest 2 View  02/01/2014   CLINICAL DATA:  Fatigue, fever, short of breath  EXAM: CHEST  2 VIEW  COMPARISON:  DG CHEST 2 VIEW dated 10/17/2013  FINDINGS: Sternotomy wires overlie  normal cardiac silhouette. There is interval increase in central venous pulmonary congestion. No focal consolidation. No pneumothorax.  IMPRESSION: Increased central venous pulmonary congestion.   Electronically Signed   By: Suzy Bouchard M.D.   On: 02/01/2014 13:47    The patient seen by me. Patient reports fatigue fever and chills shortness of breath for 2-3 days. Patient went to PCP of this and on room air sats were 86%. Started on oxygen on 2 L at 98%. Sent here. Patient's chest x-ray negative for pneumonia no significant leukocytosis. Patient's d-dimer was elevated will require CT angios chest this will also pick up any subtle CHF or pneumonia process. Patient's afebrile here today. Patient with a lot of diffuse wheezing suggestive of a bronchitis or reactive airway disease or undiagnosed COPD. Patient quit just in 2014 from smoking. CT angios will dictate whether admission is required. However if hypoxia continues patient will definitely require admission.  After reevaluation suspect this is an exacerbation of COPD. Patient I think has an official diagnosis of COPD. Patient has received the 2 in the abdomen a continuous neb and IV a site metal steroids. CT angios be necessary just to rule out PE since it was ordered however most likely this is an exacerbation with hypoxia COPD. We'll discuss with the admitting hospitalist.  Mervin Kung, MD 02/01/14 [phone]

## 2014-02-01 NOTE — ED Notes (Signed)
Pt. Reports being very fatigue and sleepy all the time.

## 2014-02-01 NOTE — Assessment & Plan Note (Signed)
Suspect clinical pna bilat basilar though again she appears to tolerate ok except for constitutional symtpoms and hypoxia; tx as above

## 2014-02-01 NOTE — Patient Instructions (Signed)
You had the antibiotic shot today Please continue all other medications as before, and refills have been done if requested. Please have the pharmacy call with any other refills you may need.  Please go to ER for further evaluation

## 2014-02-01 NOTE — Progress Notes (Signed)
Patient arrived on unit, vital signs stable on 3L oxygen.  Family at bedside.  Will continue to monitor.

## 2014-02-01 NOTE — Assessment & Plan Note (Signed)
Appears chronic - liekly secondary - ? Copd related, chronic hypoxia at night or day as well

## 2014-02-01 NOTE — Progress Notes (Signed)
Subjective:    Patient ID: [name], female    DOB: 1956-05-17, 58 y.o.   MRN: [id number]  HPI  Here with 2-3 days onset feverish, coldness, lack of energy, worsening fatigue and sleepiness, left ear pain, shortness of breath, but no CP, palp's, diaphoresis, n/v, dizziness, falls or even cough.  Has lab evidence for ongoing polycythemia, hx of COPD exac with bronchospasm requiring antibx/steroid therapy in Dec 2014. Also mentions bitter taste in mouth and reflux.  Has daytime hypersomnolence ongoing prior to more recent symptoms.  Stoic today, does not want to go ER unless just has to. Past Medical History  Diagnosis Date  . Coronary artery disease   . Bronchitis   . Sinusitis   . Hypertension   . Headache   . Heart disease   . Hyperlipidemia   . CAD (coronary artery disease) 09/03/2013   Past Surgical History  Procedure Laterality Date  . Coronary artery bypass graft  2003    Triple bypass    reports that she quit smoking about 7 months ago. She does not have any smokeless tobacco history on file. She reports that she drinks alcohol. She reports that she does not use illicit drugs. family history includes Cancer in her other; Heart Problems (age of onset: 51) in her mother; Heart disease in her other; Stroke in her other. No Known Allergies Current Outpatient Prescriptions on File Prior to Visit  Medication Sig Dispense Refill  . albuterol (PROVENTIL HFA;VENTOLIN HFA) 108 (90 BASE) MCG/ACT inhaler Inhale 1-2 puffs into the lungs every 6 (six) hours as needed for wheezing or shortness of breath.  1 Inhaler  11  . amLODipine (NORVASC) 5 MG tablet Take 1 tablet (5 mg total) by mouth daily.  90 tablet  3  . aspirin EC 81 MG tablet Take 1 tablet (81 mg total) by mouth daily.  30 tablet  0  . atorvastatin (LIPITOR) 40 MG tablet Take 1 tablet (40 mg total) by mouth daily at 6 PM.  30 tablet  6  . carvedilol (COREG) 12.5 MG tablet Take 1 tablet (12.5 mg total) by mouth 2 (two) times  daily.  60 tablet  5  . fluticasone (FLONASE) 50 MCG/ACT nasal spray Place 2 sprays into both nostrils daily.  16 g  5  . isosorbide mononitrate (IMDUR) 60 MG 24 hr tablet Take 1.5 tablets (90 mg total) by mouth daily.  45 tablet  11  . predniSONE (DELTASONE) 50 MG tablet One tablet PO daily for 4 days  4 tablet  0  . PROVENTIL HFA 108 (90 BASE) MCG/ACT inhaler USE 2 PUFFS EVERY 6 HOURS AS NEEDED FOR WHEEZING  6.7 each  6   No current facility-administered medications on file prior to visit.   Review of Systems  Constitutional: Negative for unexpected weight change, or unusual diaphoresis  HENT: Negative for tinnitus.   Eyes: Negative for photophobia and visual disturbance.  Respiratory: Negative for choking and stridor.   Gastrointestinal: Negative for vomiting and blood in stool.  Genitourinary: Negative for hematuria and decreased urine volume.  Musculoskeletal: Negative for acute joint swelling Skin: Negative for color change and wound.  Neurological: Negative for tremors and numbness other than noted  Psychiatric/Behavioral: Negative for decreased concentration or  hyperactivity.       Objective:   Physical Exam BP 122/76  Pulse 65  Temp(Src) 97.7 F (36.5 C) (Oral)  Wt 157 lb 6 oz (71.385 kg)  SpO2 86% VS noted, mild ill Constitutional:  Pt appears well-developed and well-nourished.  HENT: Head: NCAT.  Right Ear: External ear normal.  Left Ear: External ear normal.  Bilat tm's with mild erythema left > right  Max sinus areas non tender.  Pharynx with mild erythema, no exudate Eyes: Conjunctivae and EOM are normal. Pupils are equal, round, and reactive to light.  Neck: Normal range of motion. Neck supple.  Cardiovascular: Normal rate and regular rhythm.   Pulmonary/Chest: Effort normal and breath sounds decreased bilat , no wheeze, but has bibasilar rales  Abd:  Soft, NT, non-distended, + BS Neurological: Pt is alert. Not confused  Skin: Skin is warm. No erythema.    Psychiatric: Pt behavior is normal. Thought content normal.     Assessment & Plan:

## 2014-02-01 NOTE — ED Notes (Signed)
PA at bedside.

## 2014-02-01 NOTE — ED Provider Notes (Signed)
CSN: [id number]     Arrival date & time 02/01/14  1238 History   First MD Initiated Contact with Patient 02/01/14 1315     Chief Complaint  Patient presents with  . Fatigue  . Fever     (Consider location/radiation/quality/duration/timing/severity/associated sxs/prior Treatment) Patient is a 58 y.o. female presenting with fever.  Fever  58 yo female with hx of COPD presents with fatigue, fever/chills, and SOB x 2-3 days. Patient States SOB has been progressively worsening over the past few weeks. Patient seen at pcp today with O2 sat of 86%. Patient states she has been using her inhaler more than usual, about 2-3 times daily. Denies any CP, N/V, abdominal pain, or cough. Denies any recent weight gain, leg swelling, or orthopnea.   Well's Criteria: Suspect DVT : (3) No PE most likely: (3) No HR > 100: (1.5) No Immobilization > 3 days or Surgery w/in 4 wks: (1.5) No Hx of DVT/PE: (1.5) No Hemoptysis: (1) No Hx of Cancer with tx w/in 6 mo: (1) No Total: 0     Past Medical History  Diagnosis Date  . Coronary artery disease   . Bronchitis   . Sinusitis   . Hypertension   . Headache   . Heart disease   . Hyperlipidemia   . CAD (coronary artery disease) 09/03/2013  . CHF (congestive heart failure)    Past Surgical History  Procedure Laterality Date  . Coronary artery bypass graft  2003    Triple bypass   Family History  Problem Relation Age of Onset  . Heart Problems Mother 82  . Heart disease Other   . Cancer Other     colon  . Stroke Other    History  Substance Use Topics  . Smoking status: Former Smoker -- 0.50 packs/day    Quit date: 06/30/2013  . Smokeless tobacco: Not on file  . Alcohol Use: Yes     Comment: occasional   OB History   Grav Para Term Preterm Abortions TAB SAB Ect Mult Living                 Review of Systems  Constitutional: Positive for fever.  All other systems reviewed and are negative.     Allergies  Review of patient's  allergies indicates no known allergies.  Home Medications   Prior to Admission medications   Medication Sig Start Date End Date Taking? Authorizing Provider  acetaminophen (TYLENOL) 500 MG tablet Take 1,000 mg by mouth at bedtime as needed for headache.   Yes Historical Provider, MD  albuterol (PROVENTIL HFA;VENTOLIN HFA) 108 (90 BASE) MCG/ACT inhaler Inhale 1-2 puffs into the lungs every 6 (six) hours as needed for wheezing or shortness of breath. 12/29/13  Yes Biagio Borg, MD  amLODipine (NORVASC) 5 MG tablet Take 1 tablet (5 mg total) by mouth daily. 08/20/13  Yes Candee Furbish, MD  aspirin EC 81 MG tablet Take 1 tablet (81 mg total) by mouth daily. 07/02/13  Yes Phill Myron, MD  atorvastatin (LIPITOR) 40 MG tablet Take 1 tablet (40 mg total) by mouth daily at 6 PM. 08/20/13  Yes Candee Furbish, MD  carvedilol (COREG) 12.5 MG tablet Take 1 tablet (12.5 mg total) by mouth 2 (two) times daily. 09/08/13  Yes Candee Furbish, MD  fluticasone (FLONASE) 50 MCG/ACT nasal spray Place 2 sprays into both nostrils daily. 09/03/13  Yes Biagio Borg, MD  isosorbide mononitrate (IMDUR) 60 MG 24 hr tablet Take 1.5 tablets (90 mg total)  by mouth daily. 08/20/13  Yes Candee Furbish, MD   BP 111/54  Pulse 62  Temp(Src) 97.3 F (36.3 C) (Oral)  Resp 20  Ht 5\' 1"  (1.549 m)  Wt 163 lb 12.8 oz (74.299 kg)  BMI 30.97 kg/m2  SpO2 92% Physical Exam  Nursing note and vitals reviewed. Constitutional: She is oriented to person, place, and time. She appears well-developed and well-nourished. No distress.  HENT:  Head: Normocephalic and atraumatic.  Mouth/Throat: Oropharynx is clear and moist. No oropharyngeal exudate.  Eyes: Conjunctivae and EOM are normal. Pupils are equal, round, and reactive to light. No scleral icterus.  Neck: Normal range of motion. Neck supple. No JVD present. No tracheal deviation present.  Cardiovascular: Normal rate and regular rhythm.  Exam reveals no gallop and no friction rub.   No murmur  heard. Pulmonary/Chest: Effort normal. No respiratory distress. She has wheezes (diffuse mild inspiratory wheeze). She has no rhonchi. She has rales (at anterior sternal borders. ).  Abdominal: Soft. She exhibits no distension. There is no tenderness.  Musculoskeletal: Normal range of motion. She exhibits no edema.  Neurological: She is alert and oriented to person, place, and time.  Skin: Skin is warm and dry. She is not diaphoretic.  Psychiatric: She has a normal mood and affect. Her behavior is normal.    ED Course  Procedures (including critical care time) Labs Review Labs Reviewed  CBC - Abnormal; Notable for the following:    RBC 6.01 (*)    Hemoglobin 17.6 (*)    HCT 54.0 (*)    All other components within normal limits  BASIC METABOLIC PANEL - Abnormal; Notable for the following:    Glucose, Bld 100 (*)    Creatinine, Ser 0.46 (*)    All other components within normal limits  PRO B NATRIURETIC PEPTIDE - Abnormal; Notable for the following:    Pro B Natriuretic peptide (BNP) 485.2 (*)    All other components within normal limits  D-DIMER, QUANTITATIVE - Abnormal; Notable for the following:    D-Dimer, Quant 0.69 (*)    All other components within normal limits  URINALYSIS, ROUTINE W REFLEX MICROSCOPIC - Abnormal; Notable for the following:    Hgb urine dipstick SMALL (*)    All other components within normal limits  BASIC METABOLIC PANEL - Abnormal; Notable for the following:    Glucose, Bld 147 (*)    Creatinine, Ser 0.45 (*)    All other components within normal limits  CBC - Abnormal; Notable for the following:    RBC 5.72 (*)    Hemoglobin 16.8 (*)    HCT 51.2 (*)    All other components within normal limits  LIPID PANEL - Abnormal; Notable for the following:    HDL 38 (*)    All other components within normal limits  GLUCOSE, CAPILLARY - Abnormal; Notable for the following:    Glucose-Capillary 157 (*)    All other components within normal limits  I-STAT  ARTERIAL BLOOD GAS, ED - Abnormal; Notable for the following:    pH, Arterial 7.287 (*)    pCO2 arterial 66.4 (*)    pO2, Arterial 75.0 (*)    Bicarbonate 31.7 (*)    All other components within normal limits  URINE CULTURE  MAGNESIUM  TSH  URINE MICROSCOPIC-ADD ON  Randolm Idol, ED    Imaging Review Dg Chest 2 View  02/01/2014   CLINICAL DATA:  Fatigue, fever, short of breath  EXAM: CHEST  2 VIEW  COMPARISON:  DG CHEST 2 VIEW dated 10/17/2013  FINDINGS: Sternotomy wires overlie normal cardiac silhouette. There is interval increase in central venous pulmonary congestion. No focal consolidation. No pneumothorax.  IMPRESSION: Increased central venous pulmonary congestion.   Electronically Signed   By: Suzy Bouchard M.D.   On: 02/01/2014 13:47   Ct Angio Chest Pe W/cm &/or Wo Cm  02/01/2014   CLINICAL DATA:  Fatigue.  Hypoxia.  Fever.  EXAM: CT ANGIOGRAPHY CHEST WITH CONTRAST  TECHNIQUE: Multidetector CT imaging of the chest was performed using the standard protocol during bolus administration of intravenous contrast. Multiplanar CT image reconstructions and MIPs were obtained to evaluate the vascular anatomy.  CONTRAST:  169mL OMNIPAQUE IOHEXOL 350 MG/ML SOLN  COMPARISON:  No priors.  FINDINGS: Mediastinum: No filling defects within the pulmonary arterial tree to suggest underlying pulmonary embolism. Heart size is mildly enlarged. There is no significant pericardial fluid, thickening or pericardial calcification. There is atherosclerosis of the thoracic aorta, the great vessels of the mediastinum and the coronary arteries, including calcified atherosclerotic plaque in the left anterior descending coronary arteries. Status post median sternotomy for CABG, including LIMA to the LAD. No pathologically enlarged mediastinal or hilar lymph nodes. Esophagus is unremarkable in appearance.  Lungs/Pleura: Mild centrilobular or paraseptal emphysema. No acute consolidative airspace disease. No pleural  effusions. Scattered linear opacities in the lungs bilaterally, favored to represent areas of chronic scarring. No acute consolidative airspace disease. No pleural effusions. 5 mm nodule in the right upper lobe (image 37 of series 7). No larger more suspicious appearing pulmonary nodules or masses are otherwise noted.  Upper Abdomen: Unremarkable.  Musculoskeletal: Median sternotomy wires. There are no aggressive appearing lytic or blastic lesions noted in the visualized portions of the skeleton.  Review of the MIP images confirms the above findings.  IMPRESSION: 1. No evidence of pulmonary embolism. 2. No acute findings in the thorax to account for the patient's symptoms. 3. Mild centrilobular and paraseptal emphysema. 4. 5 mm right upper lobe nodule. Given the smoking related changes in the lungs, the patient is at high risk for bronchogenic carcinoma, and a follow-up chest CT at 6-12 months is recommended. This recommendation follows the consensus statement: Guidelines for Management of Small Pulmonary Nodules Detected on CT Scans: A Statement from the [place] as published in Radiology 2005;237:395-400.   Electronically Signed   By: Vinnie Langton M.D.   On: 02/01/2014 18:48     EKG Interpretation   Date/Time:  Tuesday February 01 2014 12:49:36 EDT Ventricular Rate:  66 PR Interval:  204 QRS Duration: 70 QT Interval:  412 QTC Calculation: 431 R Axis:   93 Text Interpretation:  Normal sinus rhythm with sinus arrhythmia Rightward  axis ST \\T \ T wave abnormality, consider anterior ischemia Abnormal ECG No  significant change since last tracing Confirmed by ZACKOWSKI  MD, SCOTT  [phone]) on 02/01/2014 4:19:49 PM      MDM   Final diagnoses:  Hypoxia  COPD (chronic obstructive pulmonary disease)   Patient presents to ED with hx of worsening SOB and low O2 sat at PCP office today.  Patient Pulse ox on presentation 86% on RA.  VS otherwise wnl CXR shows increased central venous  pulmonary congestion BNP elevated at 485.2, improved from prior BNP at 546.4  Prior CXR showed no evidence of Pulmonary congestion. Doubt CHF, patient admits to recent wt loss, no orthopnea, no JVD or peripheral edema,  improved BNP.  CXR shows no evidence of pneumonia.  CBC  shows polycythemia that appears chronic compared to priors. Patient does not appear dehydrated. Suspect likely secondary to COPD. Given patient hx and exam, suspect COPD exacerbation. Patient treated with Duoneb and 125mg  IV solumedrol, followed by continuous albuterol neb. Post tx patient O2 sat dropped to mid to low 80s on RA. Patient placed on 3 L O2 and improved to mid 90s. Patient O2 sat noted to drop to 80s when oxygen removed. Patient discussed with Dr. Rogene Houston. Plan to admit patient for further management and evaluation. Discussed findings and plan with patient and family. Family and pt in agreement with plan.   Patient is low risk for PE, but does not meet PERC criteria.  D-dimer is elevated at 0.69. Will get CTA chest to assess for PE.         Sherrie George, PA-C 02/02/14 [phone]

## 2014-02-01 NOTE — H&P (Signed)
[facility] History and Physical  DAREN Fleming YIA:[id number] DOB: [birth year] DOA: 02/01/2014  Referring physician: Dr. Rogene Houston PCP: Cathlean Cower, MD   Chief Complaint: Shortness of breath/hypoxia  HPI: [name] is a 58 y.o. female  With history of coronary artery disease status post CABG x3, hyperlipidemia, hypertension, tobacco abuse who presents to the ED from PCPs office with a one-week history of generalized fatigue, shortness of breath on exertion, wheezing, chills, decreased appetite, they did taste in the mouth, chills. Patient denies any fevers, no chest pain, no dysuria, no nausea, no vomiting, no abdominal pain, no melena, no hematemesis, no hematochezia, no cough. Patient denies any recent weight gain. Patient denies any recent long travel, no recent surgeries. Patient had presented to PCPs office and was noted to be hypoxic with sats dropping to 86% on room and was subsequently sent to the emergency room. In the ED EKG which was done had some nonspecific ST-T wave abnormalities unchanged from prior EKG. Basic metabolic profile done was unremarkable. Point-of-care troponin was negative. Chest x-ray done was negative for any acute infiltrate, with increased central venous pulmonary congestion. Pro BNP was 485.2. CBC had a hemoglobin of 17.6 otherwise was within normal limits. D-dimer was elevated 0.69. CT of the chest was ordered to rule out PE and was pending at the time of my interview. We were called by the emergency department to admit the patient for further evaluation and management.   Review of Systems: As per history of present illness otherwise negative. Constitutional:  No weight loss, night sweats, Fevers, chills, fatigue.  HEENT:  No headaches, Difficulty swallowing,Tooth/dental problems,Sore throat,  No sneezing, itching, ear ache, nasal congestion, post nasal drip,  Cardio-vascular:  No chest pain, Orthopnea, PND, swelling in lower extremities,  anasarca, dizziness, palpitations  GI:  No heartburn, indigestion, abdominal pain, nausea, vomiting, diarrhea, change in bowel habits, loss of appetite  Resp:  No shortness of breath with exertion or at rest. No excess mucus, no productive cough, No non-productive cough, No coughing up of blood.No change in color of mucus.No wheezing.No chest wall deformity  Skin:  no rash or lesions.  GU:  no dysuria, change in color of urine, no urgency or frequency. No flank pain.  Musculoskeletal:  No joint pain or swelling. No decreased range of motion. No back pain.  Psych:  No change in mood or affect. No depression or anxiety. No memory loss.   Past Medical History  Diagnosis Date  . Coronary artery disease   . Bronchitis   . Sinusitis   . Hypertension   . Headache   . Heart disease   . Hyperlipidemia   . CAD (coronary artery disease) 09/03/2013  . CHF (congestive heart failure)    Past Surgical History  Procedure Laterality Date  . Coronary artery bypass graft  2003    Triple bypass   Social History:  reports that she quit smoking about 7 months ago. She does not have any smokeless tobacco history on file. She reports that she drinks alcohol. She reports that she does not use illicit drugs.  No Known Allergies  Family History  Problem Relation Age of Onset  . Heart Problems Mother 27  . Heart disease Other   . Cancer Other     colon  . Stroke Other      Prior to Admission medications   Medication Sig Start Date End Date Taking? Authorizing Provider  acetaminophen (TYLENOL) 500 MG tablet Take 1,000 mg by mouth at  bedtime as needed for headache.   Yes Historical Provider, MD  albuterol (PROVENTIL HFA;VENTOLIN HFA) 108 (90 BASE) MCG/ACT inhaler Inhale 1-2 puffs into the lungs every 6 (six) hours as needed for wheezing or shortness of breath. 12/29/13  Yes Biagio Borg, MD  amLODipine (NORVASC) 5 MG tablet Take 1 tablet (5 mg total) by mouth daily. 08/20/13  Yes Candee Furbish, MD    aspirin EC 81 MG tablet Take 1 tablet (81 mg total) by mouth daily. 07/02/13  Yes Phill Myron, MD  atorvastatin (LIPITOR) 40 MG tablet Take 1 tablet (40 mg total) by mouth daily at 6 PM. 08/20/13  Yes Candee Furbish, MD  carvedilol (COREG) 12.5 MG tablet Take 1 tablet (12.5 mg total) by mouth 2 (two) times daily. 09/08/13  Yes Candee Furbish, MD  fluticasone (FLONASE) 50 MCG/ACT nasal spray Place 2 sprays into both nostrils daily. 09/03/13  Yes Biagio Borg, MD  isosorbide mononitrate (IMDUR) 60 MG 24 hr tablet Take 1.5 tablets (90 mg total) by mouth daily. 08/20/13  Yes Candee Furbish, MD   Physical Exam: Filed Vitals:   02/01/14 1808  BP: 123/64  Pulse: 65  Temp: 98.2 F (36.8 C)  Resp: 26    BP 123/64  Pulse 65  Temp(Src) 98.2 F (36.8 C) (Oral)  Resp 26  SpO2 96%  General:  Appears calm and comfortable, speaking in full sentences no use of accessory muscles of respiration. Eyes: PERRLA, EOMI, normal lids, irises & conjunctiva ENT: grossly normal hearing, lips & tongue. Dry mucous membranes Neck: no LAD, masses or thyromegaly Cardiovascular: RRR, no m/r/g. No LE edema. Respiratory: Poor to fair air movement. No use of accessory muscles of respiration. Speaking in full sentences. Mild expiratory wheezing. No crackles, no rhonchi. Abdomen: soft, ntnd, positive bowel sounds, no rebound, no guarding. Skin: no rash or induration seen on limited exam Musculoskeletal: grossly normal tone BUE/BLE Psychiatric: grossly normal mood and affect, speech fluent and appropriate Neurologic: Alert and oriented x3. Cranial nerves II through XII are grossly intact. No focal deficits.           Labs on Admission:  Basic Metabolic Panel:  Recent Labs Lab 02/01/14 1300  NA 143  K 4.6  CL 99  CO2 31  GLUCOSE 100*  BUN 12  CREATININE 0.46*  CALCIUM 9.3   Liver Function Tests: No results found for this basename: AST, ALT, ALKPHOS, BILITOT, PROT, ALBUMIN,  in the last 168 hours No results  found for this basename: LIPASE, AMYLASE,  in the last 168 hours No results found for this basename: AMMONIA,  in the last 168 hours CBC:  Recent Labs Lab 02/01/14 1300  WBC 7.9  HGB 17.6*  HCT 54.0*  MCV 89.9  PLT 180   Cardiac Enzymes: No results found for this basename: CKTOTAL, CKMB, CKMBINDEX, TROPONINI,  in the last 168 hours  BNP (last 3 results)  Recent Labs  06/30/13 1640 10/17/13 1653 02/01/14 1300  PROBNP 192.3* 546.4* 485.2*   CBG: No results found for this basename: GLUCAP,  in the last 168 hours  Radiological Exams on Admission: Dg Chest 2 View  02/01/2014   CLINICAL DATA:  Fatigue, fever, short of breath  EXAM: CHEST  2 VIEW  COMPARISON:  DG CHEST 2 VIEW dated 10/17/2013  FINDINGS: Sternotomy wires overlie normal cardiac silhouette. There is interval increase in central venous pulmonary congestion. No focal consolidation. No pneumothorax.  IMPRESSION: Increased central venous pulmonary congestion.   Electronically Signed   By:  Suzy Bouchard M.D.   On: 02/01/2014 13:47    EKG: Independently reviewed. Normal sinus rhythm. Poor R-wave progression. Nonspecific ST-T wave abnormalities unchanged from prior EKG.  Assessment/Plan Principal Problem:   Hypoxia Active Problems:   Essential hypertension   COPD (chronic obstructive pulmonary disease) with chronic bronchitis   Status post coronary artery bypass grafting   CAD (coronary artery disease)   GERD (gastroesophageal reflux disease)   COPD with acute exacerbation   #1 hypoxia/acute COPD exacerbation Patient present with hypoxia, one week history of shortness of breath generalized fatigue and wheezing noted to have sats of 86% on room at PCPs office. Chest x-ray which was done was negative for any acute infiltrate. ABG was ordered as requested which showed a blood gas with a pH of 7.29 PCO2 of 66 PO2 of 75 bicarbonate of 32 on 92 on 3 L nasal cannula. D-dimer was obtained was elevated and a such CT  angiogram has been ordered by ED physician. EKG does not show any significant ischemic changes. Patient with some clinical improvement after receiving some nebulizer treatments in the emergency room. Will admit the patient to telemetry. Place on oxygen, IV Solu-Medrol, IV Levaquin, scheduled nebulizers. Follow.  #2 hypertension Continue home regimen of Norvasc, Coreg, Imdur.  #3 gastroesophageal reflux disease PPI.  #4 hyperlipidemia Check a fasting lipid panel and continue statin.  #5 coronary artery disease Stable. EKG shows some nonspecific ST-T wave abnormalities. Patient denies any acute chest pain. Continue aspirin, Norvasc, statin, Coreg, Imdur.  #6 tobacco abuse Tobacco cessation. Place on a nicotine patch.  #7 prophylaxis PPI for GI prophylaxis. Lovenox for DVT prophylaxis.    Code Status: Full Family Communication: Updated patient at bedside. Disposition Plan: Admit to telemetry.  Time spent: 65 mins  Eugenie Filler MD [facility] Pager [phone]

## 2014-02-01 NOTE — Progress Notes (Signed)
Pre visit review using our clinic review tool, if applicable. No additional management support is needed unless otherwise documented below in the visit note. 

## 2014-02-01 NOTE — Assessment & Plan Note (Signed)
For otc nexium for now,  to f/u any worsening symptoms or concerns

## 2014-02-01 NOTE — Assessment & Plan Note (Addendum)
Likely mild acute resp failure, tolerates pretty well except for symptoms as per HPI, but suspect bibas pna based on hx and exam (though no cough, prod or o/w); advised pt to go to ER for further evaluation though she is reluctant; will give 1 gm IM rocephin as she is here now  Note:  Total time for pt hx, exam, review of record with pt in the room, determination of diagnoses and plan for further eval and tx is > 40 min, with over 50% spent in coordination and counseling of patient

## 2014-02-02 DIAGNOSIS — J449 Chronic obstructive pulmonary disease, unspecified: Secondary | ICD-10-CM

## 2014-02-02 DIAGNOSIS — R911 Solitary pulmonary nodule: Secondary | ICD-10-CM

## 2014-02-02 DIAGNOSIS — J9600 Acute respiratory failure, unspecified whether with hypoxia or hypercapnia: Principal | ICD-10-CM

## 2014-02-02 LAB — CBC
HEMATOCRIT: 51.2 % — AB (ref 36.0–46.0)
HEMOGLOBIN: 16.8 g/dL — AB (ref 12.0–15.0)
MCH: 29.4 pg (ref 26.0–34.0)
MCHC: 32.8 g/dL (ref 30.0–36.0)
MCV: 89.5 fL (ref 78.0–100.0)
Platelets: 154 10*3/uL (ref 150–400)
RBC: 5.72 MIL/uL — ABNORMAL HIGH (ref 3.87–5.11)
RDW: 14 % (ref 11.5–15.5)
WBC: 6.4 10*3/uL (ref 4.0–10.5)

## 2014-02-02 LAB — BASIC METABOLIC PANEL
BUN: 11 mg/dL (ref 6–23)
CHLORIDE: 100 meq/L (ref 96–112)
CO2: 28 meq/L (ref 19–32)
Calcium: 9 mg/dL (ref 8.4–10.5)
Creatinine, Ser: 0.45 mg/dL — ABNORMAL LOW (ref 0.50–1.10)
GFR calc non Af Amer: >90 mL/min (ref >90)
GLUCOSE: 147 mg/dL — AB (ref 70–99)
POTASSIUM: 4.4 meq/L (ref 3.7–5.3)
Sodium: 142 mEq/L (ref 137–147)

## 2014-02-02 LAB — LIPID PANEL
Cholesterol: 116 mg/dL (ref 0–200)
HDL: 38 mg/dL — ABNORMAL LOW (ref >39)
LDL Cholesterol: 68 mg/dL (ref 0–99)
TRIGLYCERIDES: 50 mg/dL (ref <150)
Total CHOL/HDL Ratio: 3.1 RATIO
VLDL: 10 mg/dL (ref 0–40)

## 2014-02-02 LAB — GLUCOSE, CAPILLARY: Glucose-Capillary: 157 mg/dL — ABNORMAL HIGH (ref 70–99)

## 2014-02-02 MED ORDER — TIOTROPIUM BROMIDE MONOHYDRATE 18 MCG IN CAPS
18.0000 ug | ORAL_CAPSULE | Freq: Every day | RESPIRATORY_TRACT | Status: DC
  Administered 2014-02-02: 18 ug via RESPIRATORY_TRACT
  Filled 2014-02-02: qty 5

## 2014-02-02 MED ORDER — PREDNISONE 10 MG PO TABS: ORAL_TABLET | ORAL | Status: DC

## 2014-02-02 MED ORDER — LEVOFLOXACIN 500 MG PO TABS
500.0000 mg | ORAL_TABLET | Freq: Every day | ORAL | Status: DC
  Administered 2014-02-02: 500 mg via ORAL
  Filled 2014-02-02: qty 1

## 2014-02-02 MED ORDER — MENTHOL 3 MG MT LOZG
1.0000 | LOZENGE | OROMUCOSAL | Status: DC | PRN
  Filled 2014-02-02: qty 9

## 2014-02-02 MED ORDER — PNEUMOCOCCAL VAC POLYVALENT 25 MCG/0.5ML IJ INJ
0.5000 mL | INJECTION | Freq: Once | INTRAMUSCULAR | Status: AC
  Administered 2014-02-02: 0.5 mL via INTRAMUSCULAR
  Filled 2014-02-02: qty 0.5

## 2014-02-02 MED ORDER — NICOTINE 21 MG/24HR TD PT24: 21.0000 mg | MEDICATED_PATCH | Freq: Every day | TRANSDERMAL | Status: DC

## 2014-02-02 MED ORDER — LEVOFLOXACIN 500 MG PO TABS: 500.0000 mg | ORAL_TABLET | Freq: Every day | ORAL | Status: DC

## 2014-02-02 MED ORDER — PREDNISONE 10 MG PO TABS
10.0000 mg | ORAL_TABLET | Freq: Every day | ORAL | Status: DC
  Administered 2014-02-02: 10 mg via ORAL
  Filled 2014-02-02 (×2): qty 1

## 2014-02-02 NOTE — Progress Notes (Signed)
[facility] PROGRESS NOTE Assessment/Plan: Hypoxia/acute COPD exacerbation: - Place on oxygen, IV Solu-Medrol, IV Levaquin, scheduled nebulizers since admision - lung are clear.  - ambulate pt and check saturations. Change medications to orals add spiriva.  Hypertension  - Continue home regimen of Norvasc, Coreg, Imdur.   Gastroesophageal reflux disease  - PPI.   hyperlipidemia  Check a fasting lipid panel and continue statin.   Coronary artery disease  - Stable. EKG shows some nonspecific ST-T wave abnormalities. Patient denies any acute chest pain. Continue aspirin, - - Norvasc, statin, Coreg, Imdur.   Tobacco abuse  Tobacco cessation. Place on a nicotine patch.   prophylaxis  PPI for GI prophylaxis. Lovenox for DVT prophylaxis.     Code Status: full Family Communication: none  Disposition Plan: inpatient   Consultants:  none  Procedures:  Ct angio chest: negative for PE, 5 mm nodule.  Antibiotics:  levaquin (indicate start date, and stop date if known)  HPI/Subjective: Breathing is better  Objective: Filed Vitals:   02/01/14 2127 02/02/14 0131 02/02/14 0618 02/02/14 0630  BP: 107/62 113/54 110/70   Pulse: 67 70 62   Temp: 97.9 F (36.6 C) 97.3 F (36.3 C) 97.3 F (36.3 C)   TempSrc: Oral Oral Oral   Resp: 18 18 18    Height:      Weight:    74.299 kg (163 lb 12.8 oz)  SpO2: 97% 96% 94%     Intake/Output Summary (Last 24 hours) at 02/02/14 0736 Last data filed at 02/02/14 0322  Gross per 24 hour  Intake     95 ml  Output    550 ml  Net   -455 ml   Filed Weights   02/01/14 1848 02/02/14 0630  Weight: 72.893 kg (160 lb 11.2 oz) 74.299 kg (163 lb 12.8 oz)    Exam:  General: Alert, awake, oriented x3, in no acute distress.  HEENT: No bruits, no goiter.  Heart: Regular rate and rhythm, without murmurs, rubs, gallops.  Lungs: Good air movement, clear  Abdomen: Soft, nontender, nondistended, positive bowel sounds.    Data  Reviewed: Basic Metabolic Panel:  Recent Labs Lab 02/01/14 1300 02/02/14 0317  NA 143 142  K 4.6 4.4  CL 99 100  CO2 31 28  GLUCOSE 100* 147*  BUN 12 11  CREATININE 0.46* 0.45*  CALCIUM 9.3 9.0  MG 2.0  --    Liver Function Tests: No results found for this basename: AST, ALT, ALKPHOS, BILITOT, PROT, ALBUMIN,  in the last 168 hours No results found for this basename: LIPASE, AMYLASE,  in the last 168 hours No results found for this basename: AMMONIA,  in the last 168 hours CBC:  Recent Labs Lab 02/01/14 1300 02/02/14 0317  WBC 7.9 6.4  HGB 17.6* 16.8*  HCT 54.0* 51.2*  MCV 89.9 89.5  PLT 180 154   Cardiac Enzymes: No results found for this basename: CKTOTAL, CKMB, CKMBINDEX, TROPONINI,  in the last 168 hours BNP (last 3 results)  Recent Labs  06/30/13 1640 10/17/13 1653 02/01/14 1300  PROBNP 192.3* 546.4* 485.2*   CBG:  Recent Labs Lab 02/02/14 0627  GLUCAP 157*    No results found for this or any previous visit (from the past 240 hour(s)).   Studies: Dg Chest 2 View  02/01/2014   CLINICAL DATA:  Fatigue, fever, short of breath  EXAM: CHEST  2 VIEW  COMPARISON:  DG CHEST 2 VIEW dated 10/17/2013  FINDINGS: Sternotomy wires overlie normal cardiac  silhouette. There is interval increase in central venous pulmonary congestion. No focal consolidation. No pneumothorax.  IMPRESSION: Increased central venous pulmonary congestion.   Electronically Signed   By: Suzy Bouchard M.D.   On: 02/01/2014 13:47   Ct Angio Chest Pe W/cm &/or Wo Cm  02/01/2014   CLINICAL DATA:  Fatigue.  Hypoxia.  Fever.  EXAM: CT ANGIOGRAPHY CHEST WITH CONTRAST  TECHNIQUE: Multidetector CT imaging of the chest was performed using the standard protocol during bolus administration of intravenous contrast. Multiplanar CT image reconstructions and MIPs were obtained to evaluate the vascular anatomy.  CONTRAST:  176mL OMNIPAQUE IOHEXOL 350 MG/ML SOLN  COMPARISON:  No priors.  FINDINGS: Mediastinum:  No filling defects within the pulmonary arterial tree to suggest underlying pulmonary embolism. Heart size is mildly enlarged. There is no significant pericardial fluid, thickening or pericardial calcification. There is atherosclerosis of the thoracic aorta, the great vessels of the mediastinum and the coronary arteries, including calcified atherosclerotic plaque in the left anterior descending coronary arteries. Status post median sternotomy for CABG, including LIMA to the LAD. No pathologically enlarged mediastinal or hilar lymph nodes. Esophagus is unremarkable in appearance.  Lungs/Pleura: Mild centrilobular or paraseptal emphysema. No acute consolidative airspace disease. No pleural effusions. Scattered linear opacities in the lungs bilaterally, favored to represent areas of chronic scarring. No acute consolidative airspace disease. No pleural effusions. 5 mm nodule in the right upper lobe (image 37 of series 7). No larger more suspicious appearing pulmonary nodules or masses are otherwise noted.  Upper Abdomen: Unremarkable.  Musculoskeletal: Median sternotomy wires. There are no aggressive appearing lytic or blastic lesions noted in the visualized portions of the skeleton.  Review of the MIP images confirms the above findings.  IMPRESSION: 1. No evidence of pulmonary embolism. 2. No acute findings in the thorax to account for the patient's symptoms. 3. Mild centrilobular and paraseptal emphysema. 4. 5 mm right upper lobe nodule. Given the smoking related changes in the lungs, the patient is at high risk for bronchogenic carcinoma, and a follow-up chest CT at 6-12 months is recommended. This recommendation follows the consensus statement: Guidelines for Management of Small Pulmonary Nodules Detected on CT Scans: A Statement from the [place] as published in Radiology 2005;237:395-400.   Electronically Signed   By: Vinnie Langton M.D.   On: 02/01/2014 18:48    Scheduled Meds: . albuterol  2.5 mg  Nebulization TID  . amLODipine  5 mg Oral Daily  . aspirin EC  81 mg Oral Daily  . atorvastatin  40 mg Oral q1800  . carvedilol  12.5 mg Oral BID WC  . enoxaparin (LOVENOX) injection  40 mg Subcutaneous Q24H  . fluticasone  2 spray Each Nare Daily  . ipratropium  0.5 mg Nebulization TID  . isosorbide mononitrate  90 mg Oral Daily  . levofloxacin (LEVAQUIN) IV  500 mg Intravenous Q24H  . methylPREDNISolone (SOLU-MEDROL) injection  125 mg Intravenous Q6H  . nicotine  21 mg Transdermal Daily  . pantoprazole  40 mg Oral Q0600  . sodium chloride  3 mL Intravenous Q12H   Continuous Infusions: . sodium chloride 75 mL/hr at 02/02/14 [facility] Pager [phone]. If 8PM-8AM, please contact night-coverage at [email or web address], password [facility] 02/02/2014, 7:36 AM  LOS: 1 day

## 2014-02-02 NOTE — Discharge Summary (Signed)
Physician Discharge Summary  [name] BOF:[id number] DOB: 06/02/1956 DOA: 02/01/2014  PCP: Rhonda Cower, MD  Admit date: 02/01/2014 Discharge date: 02/02/2014  Time spent: 35 minutes  Recommendations for Outpatient Follow-up:  1. Follow up with PCP in 2 weeks.  2. Needs a ct chest to follow up on 5 mm nodule.  Discharge Diagnoses:  Principal Problem:   Acute respiratory failure with hypoxia Active Problems:   Essential hypertension   COPD (chronic obstructive pulmonary disease) with chronic bronchitis   Status post coronary artery bypass grafting   CAD (coronary artery disease)   GERD (gastroesophageal reflux disease)   COPD with acute exacerbation   5 mm Lung nodule, solitary   Discharge Condition: stable  Diet recommendation: heart healthy  Filed Weights   02/01/14 1848 02/02/14 0630  Weight: 72.893 kg (160 lb 11.2 oz) 74.299 kg (163 lb 12.8 oz)    History of present illness:  58 y.o. female  With history of coronary artery disease status post CABG x3, hyperlipidemia, hypertension, tobacco abuse who presents to the ED from PCPs office with a one-week history of generalized fatigue, shortness of breath on exertion, wheezing, chills, decreased appetite, they did taste in the mouth, chills. Patient denies any fevers, no chest pain, no dysuria, no nausea, no vomiting, no abdominal pain, no melena, no hematemesis, no hematochezia, no cough. Patient denies any recent weight gain. Patient denies any recent long travel, no recent surgeries. Patient had presented to PCPs office and was noted to be hypoxic with sats dropping to 86% on room and was subsequently sent to the emergency room.   Hospital Course:  Acute hypoxic respiratory failureypoxia/acute COPD exacerbation:  - Place on oxygen, IV Solu-Medrol, IV Levaquin, scheduled nebulizers since admision  - lung are clear.  - ambulated pt and saturations remain above 90% on RA. Change medications to orals add spiriva.    Hypertension  - Continue home regimen of Norvasc, Coreg, Imdur.   Gastroesophageal reflux disease  - PPI.   hyperlipidemia  Check a fasting lipid panel and continue statin.   Coronary artery disease  - Stable. EKG shows some nonspecific ST-T wave abnormalities. Patient denies any acute chest pain. Continue aspirin, - - Norvasc, statin, Coreg, Imdur.   Tobacco abuse  Tobacco cessation. Place on a nicotine patch.   5 mm chest nodule: - Repeat Ct chest in 6 month   Procedures: CT angio of chest: 4.14.2015: 5 mm right upper lobe nodule   Consultations:  none  Discharge Exam: Filed Vitals:   02/02/14 0618  BP: 110/70  Pulse: 62  Temp: 97.3 F (36.3 C)  Resp: 18    General: A&O x3 Cardiovascular: RRR Respiratory: good air movement CTA B/L  Discharge Instructions You were cared for by a hospitalist during your hospital stay. If you have any questions about your discharge medications or the care you received while you were in the hospital after you are discharged, you can call the unit and asked to speak with the hospitalist on call if the hospitalist that took care of you is not available. Once you are discharged, your primary care physician will handle any further medical issues. Please note that NO REFILLS for any discharge medications will be authorized once you are discharged, as it is imperative that you return to your primary care physician (or establish a relationship with a primary care physician if you do not have one) for your aftercare needs so that they can reassess your need for medications and monitor your  lab values.      Discharge Orders   Future Appointments Provider Department Dept Phone   09/09/2014 1:00 PM Biagio Borg, MD [place] [phone]   Future Orders Complete By Expires   Diet - low sodium heart healthy  As directed    Increase activity slowly  As directed        Medication List         acetaminophen 500  MG tablet  Commonly known as:  TYLENOL  Take 1,000 mg by mouth at bedtime as needed for headache.     albuterol 108 (90 BASE) MCG/ACT inhaler  Commonly known as:  PROVENTIL HFA;VENTOLIN HFA  Inhale 1-2 puffs into the lungs every 6 (six) hours as needed for wheezing or shortness of breath.     amLODipine 5 MG tablet  Commonly known as:  NORVASC  Take 1 tablet (5 mg total) by mouth daily.     aspirin EC 81 MG tablet  Take 1 tablet (81 mg total) by mouth daily.     atorvastatin 40 MG tablet  Commonly known as:  LIPITOR  Take 1 tablet (40 mg total) by mouth daily at 6 PM.     carvedilol 12.5 MG tablet  Commonly known as:  COREG  Take 1 tablet (12.5 mg total) by mouth 2 (two) times daily.     fluticasone 50 MCG/ACT nasal spray  Commonly known as:  FLONASE  Place 2 sprays into both nostrils daily.     isosorbide mononitrate 60 MG 24 hr tablet  Commonly known as:  IMDUR  Take 1.5 tablets (90 mg total) by mouth daily.     levofloxacin 500 MG tablet  Commonly known as:  LEVAQUIN  Take 1 tablet (500 mg total) by mouth daily.     nicotine 21 mg/24hr patch  Commonly known as:  NICODERM CQ - dosed in mg/24 hours  Place 1 patch (21 mg total) onto the skin daily.     predniSONE 10 MG tablet  Commonly known as:  DELTASONE  Takes 6 tablets for 1 days, then 5 tablets for 1 days, then 4 tablets for 1 days, then 3 tablets for 1 days, then 2 tabs for 1 days, then 1 tab for 1 days, and then stop.       No Known Allergies Follow-up Information   Follow up with Rhonda Cower, MD In 2 weeks. (Needs a Ct chest to follow up on 5 mm nodule in 6 monhts)    Specialties:  Internal Medicine, Radiology   Contact information:   [place] [address] [phone]        The results of significant diagnostics from this hospitalization (including imaging, microbiology, ancillary and laboratory) are listed below for reference.    Significant Diagnostic Studies: Dg Chest 2  View  02/01/2014   CLINICAL DATA:  Fatigue, fever, short of breath  EXAM: CHEST  2 VIEW  COMPARISON:  DG CHEST 2 VIEW dated 10/17/2013  FINDINGS: Sternotomy wires overlie normal cardiac silhouette. There is interval increase in central venous pulmonary congestion. No focal consolidation. No pneumothorax.  IMPRESSION: Increased central venous pulmonary congestion.   Electronically Signed   By: Suzy Bouchard M.D.   On: 02/01/2014 13:47   Ct Angio Chest Pe W/cm &/or Wo Cm  02/01/2014   CLINICAL DATA:  Fatigue.  Hypoxia.  Fever.  EXAM: CT ANGIOGRAPHY CHEST WITH CONTRAST  TECHNIQUE: Multidetector CT imaging of the chest was performed  using the standard protocol during bolus administration of intravenous contrast. Multiplanar CT image reconstructions and MIPs were obtained to evaluate the vascular anatomy.  CONTRAST:  191mL OMNIPAQUE IOHEXOL 350 MG/ML SOLN  COMPARISON:  No priors.  FINDINGS: Mediastinum: No filling defects within the pulmonary arterial tree to suggest underlying pulmonary embolism. Heart size is mildly enlarged. There is no significant pericardial fluid, thickening or pericardial calcification. There is atherosclerosis of the thoracic aorta, the great vessels of the mediastinum and the coronary arteries, including calcified atherosclerotic plaque in the left anterior descending coronary arteries. Status post median sternotomy for CABG, including LIMA to the LAD. No pathologically enlarged mediastinal or hilar lymph nodes. Esophagus is unremarkable in appearance.  Lungs/Pleura: Mild centrilobular or paraseptal emphysema. No acute consolidative airspace disease. No pleural effusions. Scattered linear opacities in the lungs bilaterally, favored to represent areas of chronic scarring. No acute consolidative airspace disease. No pleural effusions. 5 mm nodule in the right upper lobe (image 37 of series 7). No larger more suspicious appearing pulmonary nodules or masses are otherwise noted.  Upper  Abdomen: Unremarkable.  Musculoskeletal: Median sternotomy wires. There are no aggressive appearing lytic or blastic lesions noted in the visualized portions of the skeleton.  Review of the MIP images confirms the above findings.  IMPRESSION: 1. No evidence of pulmonary embolism. 2. No acute findings in the thorax to account for the patient's symptoms. 3. Mild centrilobular and paraseptal emphysema. 4. 5 mm right upper lobe nodule. Given the smoking related changes in the lungs, the patient is at high risk for bronchogenic carcinoma, and a follow-up chest CT at 6-12 months is recommended. This recommendation follows the consensus statement: Guidelines for Management of Small Pulmonary Nodules Detected on CT Scans: A Statement from the [place] as published in Radiology 2005;237:395-400.   Electronically Signed   By: Vinnie Langton M.D.   On: 02/01/2014 18:48    Microbiology: No results found for this or any previous visit (from the past 240 hour(s)).   Labs: Basic Metabolic Panel:  Recent Labs Lab 02/01/14 1300 02/02/14 0317  NA 143 142  K 4.6 4.4  CL 99 100  CO2 31 28  GLUCOSE 100* 147*  BUN 12 11  CREATININE 0.46* 0.45*  CALCIUM 9.3 9.0  MG 2.0  --    Liver Function Tests: No results found for this basename: AST, ALT, ALKPHOS, BILITOT, PROT, ALBUMIN,  in the last 168 hours No results found for this basename: LIPASE, AMYLASE,  in the last 168 hours No results found for this basename: AMMONIA,  in the last 168 hours CBC:  Recent Labs Lab 02/01/14 1300 02/02/14 0317  WBC 7.9 6.4  HGB 17.6* 16.8*  HCT 54.0* 51.2*  MCV 89.9 89.5  PLT 180 154   Cardiac Enzymes: No results found for this basename: CKTOTAL, CKMB, CKMBINDEX, TROPONINI,  in the last 168 hours BNP: BNP (last 3 results)  Recent Labs  06/30/13 1640 10/17/13 1653 02/01/14 1300  PROBNP 192.3* 546.4* 485.2*   CBG:  Recent Labs Lab 02/02/14 0627  GLUCAP 157*       Signed:  Charlynne Cousins  [facility] 02/02/2014, 7:54 AM

## 2014-02-02 NOTE — Progress Notes (Signed)
02/02/2014-0915 am- Patient ambulated from one end of the hall to the next and back.  O2 saturations maintained levels between 88% and 92% on room air. Patient never dropped below 88%.  Catha Gosselin RN.

## 2014-02-03 LAB — POCT I-STAT 3, ART BLOOD GAS (G3+)
Acid-Base Excess: 2 mmol/L (ref 0.0–2.0)
BICARBONATE: 31.8 meq/L — AB (ref 20.0–24.0)
O2 Saturation: 92 %
PCO2 ART: 23.5 mmHg — AB (ref 35.0–45.0)
PH ART: 7.617 — AB (ref 7.350–7.450)
Patient temperature: 13
TCO2: 34 mmol/L (ref 0–100)
pO2, Arterial: 15 mmHg — CL (ref 80.0–100.0)

## 2014-02-03 LAB — URINE CULTURE
COLONY COUNT: NO GROWTH
Culture: NO GROWTH

## 2014-02-11 ENCOUNTER — Encounter: Payer: Self-pay | Admitting: Internal Medicine

## 2014-02-11 MED ORDER — TIOTROPIUM BROMIDE MONOHYDRATE 18 MCG IN CAPS: 18.0000 ug | ORAL_CAPSULE | Freq: Every day | RESPIRATORY_TRACT | Status: DC

## 2014-02-16 ENCOUNTER — Encounter: Payer: Self-pay | Admitting: Internal Medicine

## 2014-02-16 ENCOUNTER — Ambulatory Visit (INDEPENDENT_AMBULATORY_CARE_PROVIDER_SITE_OTHER): Payer: BC Managed Care – PPO | Admitting: Internal Medicine

## 2014-02-16 VITALS — BP 110/70 | HR 53 | Temp 98.3°F | Ht 61.0 in | Wt 160.2 lb

## 2014-02-16 DIAGNOSIS — J441 Chronic obstructive pulmonary disease with (acute) exacerbation: Secondary | ICD-10-CM

## 2014-02-16 DIAGNOSIS — D751 Secondary polycythemia: Secondary | ICD-10-CM

## 2014-02-16 DIAGNOSIS — I10 Essential (primary) hypertension: Secondary | ICD-10-CM

## 2014-02-16 NOTE — Progress Notes (Signed)
Subjective:    Patient ID: [name], female    DOB: 30-Jun-1956, 58 y.o.   MRN: [id number]  HPI  Here to f/u, Pt denies chest pain, increased sob or doe, wheezing, orthopnea, PND, increased LE swelling, palpitations, dizziness or syncope.   Pt denies polydipsia, polyuria, Pt states overall good compliance with meds, trying to follow lower cholesterol diet, wt overall stable but little exercise however. Pt denies new neurological symptoms such as new headache, or facial or extremity weakness or numbness  Past Medical History  Diagnosis Date  . Coronary artery disease   . Bronchitis   . Sinusitis   . Hypertension   . Headache   . Heart disease   . Hyperlipidemia   . CAD (coronary artery disease) 09/03/2013  . CHF (congestive heart failure)    Past Surgical History  Procedure Laterality Date  . Coronary artery bypass graft  2003    Triple bypass    reports that she quit smoking about 7 months ago. She does not have any smokeless tobacco history on file. She reports that she drinks alcohol. She reports that she does not use illicit drugs. family history includes Cancer in her other; Heart Problems (age of onset: 29) in her mother; Heart disease in her other; Stroke in her other. No Known Allergies Current Outpatient Prescriptions on File Prior to Visit  Medication Sig Dispense Refill  . acetaminophen (TYLENOL) 500 MG tablet Take 1,000 mg by mouth at bedtime as needed for headache.      . albuterol (PROVENTIL HFA;VENTOLIN HFA) 108 (90 BASE) MCG/ACT inhaler Inhale 1-2 puffs into the lungs every 6 (six) hours as needed for wheezing or shortness of breath.  1 Inhaler  11  . amLODipine (NORVASC) 5 MG tablet Take 1 tablet (5 mg total) by mouth daily.  90 tablet  3  . aspirin EC 81 MG tablet Take 1 tablet (81 mg total) by mouth daily.  30 tablet  0  . atorvastatin (LIPITOR) 40 MG tablet Take 1 tablet (40 mg total) by mouth daily at 6 PM.  30 tablet  6  . carvedilol (COREG) 12.5 MG tablet  Take 1 tablet (12.5 mg total) by mouth 2 (two) times daily.  60 tablet  5  . fluticasone (FLONASE) 50 MCG/ACT nasal spray Place 2 sprays into both nostrils daily.  16 g  5  . isosorbide mononitrate (IMDUR) 60 MG 24 hr tablet Take 1.5 tablets (90 mg total) by mouth daily.  45 tablet  11  . tiotropium (SPIRIVA HANDIHALER) 18 MCG inhalation capsule Place 1 capsule (18 mcg total) into inhaler and inhale daily.  30 capsule  12   No current facility-administered medications on file prior to visit.   Review of Systems  Constitutional: Negative for unusual diaphoresis or other sweats  HENT: Negative for ringing in ear Eyes: Negative for double vision or worsening visual disturbance.  Respiratory: Negative for choking and stridor.   Gastrointestinal: Negative for vomiting or other signifcant bowel change Genitourinary: Negative for hematuria or decreased urine volume.  Musculoskeletal: Negative for other MSK pain or swelling Skin: Negative for color change and worsening wound.  Neurological: Negative for tremors and numbness other than noted  Psychiatric/Behavioral: Negative for decreased concentration or agitation other than above       Objective:   Physical Exam BP 110/70  Pulse 53  Temp(Src) 98.3 F (36.8 C) (Oral)  Ht 5\' 1"  (1.549 m)  Wt 160 lb 4 oz (72.689 kg)  BMI  30.29 kg/m2  SpO2 95% VS noted, not ill appearing Constitutional: Pt appears well-developed, well-nourished.  HENT: Head: NCAT.  Right Ear: External ear normal.  Left Ear: External ear normal.  Eyes: . Pupils are equal, round, and reactive to light. Conjunctivae and EOM are normal Neck: Normal range of motion. Neck supple.  Cardiovascular: Normal rate and regular rhythm.   Pulmonary/Chest: Effort normal and breath sounds normal.  Neurological: Pt is alert. Not confused , motor grossly intact Skin: Skin is warm. No rash Psychiatric: Pt behavior is normal. No agitation.     Assessment & Plan:

## 2014-02-16 NOTE — Assessment & Plan Note (Signed)
Mild to mod persistent, likely needs ONO to r/o nocturnal hypoxemia - will order

## 2014-02-16 NOTE — Progress Notes (Signed)
Pre visit review using our clinic review tool, if applicable. No additional management support is needed unless otherwise documented below in the visit note. 

## 2014-02-16 NOTE — Patient Instructions (Addendum)
Please continue all other medications as before, and refills have been done if requested. Please have the pharmacy call with any other refills you may need.  Please continue your efforts at being more active, low cholesterol diet, and weight control.  You will be contacted regarding the referral for: [place] - for the ONO (overnight oximetry) to see if low oxygen is occuring at night  Please continue the Nicoderm AQ to stop smoking; please call if you want to add the Chantix  Please return in 6 months, or sooner if needed

## 2014-02-17 NOTE — Assessment & Plan Note (Signed)
stable overall by history and exam, recent data reviewed with pt, and pt to continue medical treatment as before,  to f/u any worsening symptoms or concerns BP Readings from Last 3 Encounters:  02/16/14 110/70  02/02/14 111/54  02/01/14 122/76

## 2014-02-17 NOTE — Assessment & Plan Note (Signed)
Resolved, no further tx needed 

## 2014-03-10 ENCOUNTER — Other Ambulatory Visit: Payer: Self-pay

## 2014-03-10 MED ORDER — CARVEDILOL 12.5 MG PO TABS: 12.5000 mg | ORAL_TABLET | Freq: Two times a day (BID) | ORAL | Status: DC

## 2014-04-27 ENCOUNTER — Other Ambulatory Visit: Payer: Self-pay

## 2014-04-27 MED ORDER — ATORVASTATIN CALCIUM 40 MG PO TABS: 40.0000 mg | ORAL_TABLET | Freq: Every day | ORAL | Status: DC

## 2014-05-18 ENCOUNTER — Telehealth: Payer: Self-pay | Admitting: Internal Medicine

## 2014-05-18 DIAGNOSIS — G4730 Sleep apnea, unspecified: Secondary | ICD-10-CM

## 2014-05-18 NOTE — Telephone Encounter (Signed)
Robin to contact pt  I received the results of the ONO - overnight oximetry which does show signficant low oxygen at night , which is highly suggestive of possible sleep apnea  Can I refer to Pulmonary for further evaluation and treatment?

## 2014-05-19 NOTE — Telephone Encounter (Signed)
Called the patient informed of ONO results.  The patient does want pulmonary referral.

## 2014-05-19 NOTE — Telephone Encounter (Signed)
Pt called stated that she can not take bother sleeping medicine and be on the oxygen at the same time. Pt stated someone call about her oxygen equipment. Please advise.

## 2014-05-19 NOTE — Telephone Encounter (Signed)
Called the patient back and did confirm she does want a referral to Pulmonary. Stated she had received several phone calls this morning regarding her ONO test and was confused.  She does want PCP to refer her to pulmonary asap.

## 2014-05-26 ENCOUNTER — Encounter: Payer: Self-pay | Admitting: Internal Medicine

## 2014-06-12 ENCOUNTER — Inpatient Hospital Stay (HOSPITAL_COMMUNITY)
Admission: EM | Admit: 2014-06-12 | Discharge: 2014-06-15 | DRG: 394 | Disposition: A | Discharge status: Home / Self Care | Payer: BC Managed Care – PPO | Source: Other Acute Inpatient Hospital | Attending: Surgery | Admitting: Surgery

## 2014-06-12 ENCOUNTER — Inpatient Hospital Stay (HOSPITAL_COMMUNITY): Payer: BC Managed Care – PPO

## 2014-06-12 ENCOUNTER — Inpatient Hospital Stay (HOSPITAL_COMMUNITY): Admission: EM | Admit: 2014-06-12 | Payer: Self-pay | Source: Other Acute Inpatient Hospital | Admitting: Surgery

## 2014-06-12 ENCOUNTER — Emergency Department: Payer: Self-pay | Admitting: Emergency Medicine

## 2014-06-12 ENCOUNTER — Encounter (HOSPITAL_COMMUNITY): Payer: Self-pay | Admitting: Anesthesiology

## 2014-06-12 ENCOUNTER — Encounter (HOSPITAL_COMMUNITY): Payer: BC Managed Care – PPO | Admitting: Anesthesiology

## 2014-06-12 ENCOUNTER — Encounter (HOSPITAL_COMMUNITY): Admission: EM | Disposition: A | Discharge status: Home / Self Care | Payer: Self-pay | Attending: Surgery

## 2014-06-12 ENCOUNTER — Inpatient Hospital Stay (HOSPITAL_COMMUNITY): Payer: BC Managed Care – PPO | Admitting: Anesthesiology

## 2014-06-12 DIAGNOSIS — F17200 Nicotine dependence, unspecified, uncomplicated: Secondary | ICD-10-CM | POA: Diagnosis present

## 2014-06-12 DIAGNOSIS — K2970 Gastritis, unspecified, without bleeding: Secondary | ICD-10-CM | POA: Diagnosis present

## 2014-06-12 DIAGNOSIS — IMO0002 Reserved for concepts with insufficient information to code with codable children: Secondary | ICD-10-CM | POA: Diagnosis present

## 2014-06-12 DIAGNOSIS — I2510 Atherosclerotic heart disease of native coronary artery without angina pectoris: Secondary | ICD-10-CM | POA: Diagnosis present

## 2014-06-12 DIAGNOSIS — K2289 Other specified disease of esophagus: Secondary | ICD-10-CM | POA: Diagnosis present

## 2014-06-12 DIAGNOSIS — I509 Heart failure, unspecified: Secondary | ICD-10-CM | POA: Diagnosis present

## 2014-06-12 DIAGNOSIS — K2980 Duodenitis without bleeding: Secondary | ICD-10-CM | POA: Diagnosis present

## 2014-06-12 DIAGNOSIS — I10 Essential (primary) hypertension: Secondary | ICD-10-CM | POA: Diagnosis present

## 2014-06-12 DIAGNOSIS — Z7982 Long term (current) use of aspirin: Secondary | ICD-10-CM | POA: Diagnosis not present

## 2014-06-12 DIAGNOSIS — J4489 Other specified chronic obstructive pulmonary disease: Secondary | ICD-10-CM | POA: Diagnosis present

## 2014-06-12 DIAGNOSIS — K2210 Ulcer of esophagus without bleeding: Secondary | ICD-10-CM | POA: Diagnosis present

## 2014-06-12 DIAGNOSIS — Z951 Presence of aortocoronary bypass graft: Secondary | ICD-10-CM | POA: Diagnosis not present

## 2014-06-12 DIAGNOSIS — E785 Hyperlipidemia, unspecified: Secondary | ICD-10-CM | POA: Diagnosis present

## 2014-06-12 DIAGNOSIS — K2990 Gastroduodenitis, unspecified, without bleeding: Secondary | ICD-10-CM | POA: Diagnosis present

## 2014-06-12 DIAGNOSIS — T18108A Unspecified foreign body in esophagus causing other injury, initial encounter: Principal | ICD-10-CM | POA: Diagnosis present

## 2014-06-12 DIAGNOSIS — J449 Chronic obstructive pulmonary disease, unspecified: Secondary | ICD-10-CM | POA: Diagnosis present

## 2014-06-12 DIAGNOSIS — K228 Other specified diseases of esophagus: Secondary | ICD-10-CM | POA: Diagnosis present

## 2014-06-12 HISTORY — PX: RIGID ESOPHAGOSCOPY: SHX5226

## 2014-06-12 LAB — COMPREHENSIVE METABOLIC PANEL
ALBUMIN: 3.9 g/dL (ref 3.4–5.0)
ANION GAP: 8 (ref 7–16)
Alkaline Phosphatase: 117 U/L — ABNORMAL HIGH
BILIRUBIN TOTAL: 0.6 mg/dL (ref 0.2–1.0)
BUN: 12 mg/dL (ref 7–18)
CALCIUM: 8.9 mg/dL (ref 8.5–10.1)
CO2: 29 mmol/L (ref 21–32)
Chloride: 105 mmol/L (ref 98–107)
Creatinine: 0.62 mg/dL (ref 0.60–1.30)
EGFR (Non-African Amer.): >60
Glucose: 113 mg/dL — ABNORMAL HIGH (ref 65–99)
Osmolality: 284 (ref 275–301)
POTASSIUM: 3.6 mmol/L (ref 3.5–5.1)
SGOT(AST): 32 U/L (ref 15–37)
SGPT (ALT): 38 U/L
Sodium: 142 mmol/L (ref 136–145)
TOTAL PROTEIN: 7.6 g/dL (ref 6.4–8.2)

## 2014-06-12 LAB — CBC
HCT: 48.3 % — ABNORMAL HIGH (ref 35.0–47.0)
HGB: 15.7 g/dL (ref 12.0–16.0)
MCH: 29.5 pg (ref 26.0–34.0)
MCHC: 32.6 g/dL (ref 32.0–36.0)
MCV: 91 fL (ref 80–100)
PLATELETS: 178 10*3/uL (ref 150–440)
RBC: 5.34 10*6/uL — ABNORMAL HIGH (ref 3.80–5.20)
RDW: 14.8 % — ABNORMAL HIGH (ref 11.5–14.5)
WBC: 7.8 10*3/uL (ref 3.6–11.0)

## 2014-06-12 LAB — TROPONIN I

## 2014-06-12 LAB — LIPASE, BLOOD: Lipase: 152 U/L (ref 73–393)

## 2014-06-12 LAB — SURGICAL PCR SCREEN
MRSA, PCR: NEGATIVE
Staphylococcus aureus: NEGATIVE

## 2014-06-12 LAB — MAGNESIUM: MAGNESIUM: 1.9 mg/dL

## 2014-06-12 SURGERY — ESOPHAGOSCOPY, RIGID
Anesthesia: General | Site: Throat

## 2014-06-12 MED ORDER — LIDOCAINE HCL (CARDIAC) 20 MG/ML IV SOLN
INTRAVENOUS | Status: AC
  Filled 2014-06-12: qty 5

## 2014-06-12 MED ORDER — FENTANYL CITRATE 0.05 MG/ML IJ SOLN
INTRAMUSCULAR | Status: DC | PRN
  Administered 2014-06-12: 100 ug via INTRAVENOUS

## 2014-06-12 MED ORDER — SUCCINYLCHOLINE CHLORIDE 20 MG/ML IJ SOLN: INTRAMUSCULAR | Status: DC | PRN

## 2014-06-12 MED ORDER — LIDOCAINE HCL (CARDIAC) 20 MG/ML IV SOLN
INTRAVENOUS | Status: DC | PRN
  Administered 2014-06-12: 50 mg via INTRAVENOUS
  Administered 2014-06-12: 80 mg via INTRAVENOUS

## 2014-06-12 MED ORDER — DEXAMETHASONE SODIUM PHOSPHATE 4 MG/ML IJ SOLN
INTRAMUSCULAR | Status: DC | PRN
  Administered 2014-06-12: 4 mg via INTRAVENOUS

## 2014-06-12 MED ORDER — OXYCODONE HCL 5 MG PO TABS: 5.0000 mg | ORAL_TABLET | Freq: Once | ORAL | Status: AC | PRN

## 2014-06-12 MED ORDER — ONDANSETRON HCL 4 MG/2ML IJ SOLN: 4.0000 mg | Freq: Four times a day (QID) | INTRAMUSCULAR | Status: DC | PRN

## 2014-06-12 MED ORDER — PIPERACILLIN-TAZOBACTAM 3.375 G IVPB 30 MIN
3.3750 g | Freq: Once | INTRAVENOUS | Status: AC
  Administered 2014-06-12: 3.375 g via INTRAVENOUS
  Filled 2014-06-12: qty 50

## 2014-06-12 MED ORDER — ARTIFICIAL TEARS OP OINT
TOPICAL_OINTMENT | OPHTHALMIC | Status: AC
  Filled 2014-06-12: qty 3.5

## 2014-06-12 MED ORDER — SUCCINYLCHOLINE CHLORIDE 20 MG/ML IJ SOLN
INTRAMUSCULAR | Status: DC | PRN
  Administered 2014-06-12: 120 mg via INTRAVENOUS

## 2014-06-12 MED ORDER — GLYCOPYRROLATE 0.2 MG/ML IJ SOLN
INTRAMUSCULAR | Status: DC | PRN
  Administered 2014-06-12: 0.3 mg via INTRAVENOUS

## 2014-06-12 MED ORDER — LACTATED RINGERS IV SOLN
INTRAVENOUS | Status: DC | PRN
  Administered 2014-06-12: 22:00:00 via INTRAVENOUS

## 2014-06-12 MED ORDER — OXYCODONE HCL 5 MG/5ML PO SOLN: 5.0000 mg | Freq: Once | ORAL | Status: AC | PRN

## 2014-06-12 MED ORDER — SODIUM CHLORIDE 0.9 % IV SOLN: Freq: Once | INTRAVENOUS | Status: DC

## 2014-06-12 MED ORDER — ONDANSETRON HCL 4 MG/2ML IJ SOLN
INTRAMUSCULAR | Status: DC | PRN
  Administered 2014-06-12: 4 mg via INTRAVENOUS

## 2014-06-12 MED ORDER — ONDANSETRON HCL 4 MG PO TABS
4.0000 mg | ORAL_TABLET | Freq: Four times a day (QID) | ORAL | Status: DC | PRN
Start: 2014-06-12 — End: 2014-06-14

## 2014-06-12 MED ORDER — HYDROMORPHONE HCL PF 1 MG/ML IJ SOLN: 0.2500 mg | INTRAMUSCULAR | Status: DC | PRN

## 2014-06-12 MED ORDER — MORPHINE SULFATE 2 MG/ML IJ SOLN
2.0000 mg | INTRAMUSCULAR | Status: DC | PRN
  Administered 2014-06-12: 2 mg via INTRAVENOUS

## 2014-06-12 MED ORDER — KCL IN DEXTROSE-NACL 20-5-0.45 MEQ/L-%-% IV SOLN
INTRAVENOUS | Status: DC
  Administered 2014-06-12 – 2014-06-13 (×2): via INTRAVENOUS
  Administered 2014-06-13: 100 mL/h via INTRAVENOUS
  Filled 2014-06-12 (×5): qty 1000

## 2014-06-12 MED ORDER — PROPOFOL 10 MG/ML IV BOLUS
INTRAVENOUS | Status: AC
  Filled 2014-06-12: qty 20

## 2014-06-12 MED ORDER — SODIUM CHLORIDE 0.9 % IJ SOLN
3.0000 mL | Freq: Two times a day (BID) | INTRAMUSCULAR | Status: DC
  Administered 2014-06-12 – 2014-06-14 (×3): 3 mL via INTRAVENOUS

## 2014-06-12 MED ORDER — ONDANSETRON HCL 4 MG/2ML IJ SOLN: 4.0000 mg | Freq: Once | INTRAMUSCULAR | Status: AC | PRN

## 2014-06-12 MED ORDER — ARTIFICIAL TEARS OP OINT
TOPICAL_OINTMENT | OPHTHALMIC | Status: DC | PRN
  Administered 2014-06-12: 1 via OPHTHALMIC

## 2014-06-12 MED ORDER — MORPHINE SULFATE 2 MG/ML IJ SOLN
INTRAMUSCULAR | Status: AC
  Filled 2014-06-12: qty 1

## 2014-06-12 MED ORDER — FENTANYL CITRATE 0.05 MG/ML IJ SOLN
INTRAMUSCULAR | Status: AC
  Filled 2014-06-12: qty 5

## 2014-06-12 MED ORDER — VECURONIUM BROMIDE 10 MG IV SOLR
INTRAVENOUS | Status: DC | PRN
  Administered 2014-06-12: 3 mg via INTRAVENOUS

## 2014-06-12 MED ORDER — MIDAZOLAM HCL 2 MG/2ML IJ SOLN
INTRAMUSCULAR | Status: AC
  Filled 2014-06-12: qty 2

## 2014-06-12 MED ORDER — PROPOFOL 10 MG/ML IV BOLUS
INTRAVENOUS | Status: DC | PRN
  Administered 2014-06-12: 150 mg via INTRAVENOUS

## 2014-06-12 MED ORDER — ONDANSETRON HCL 4 MG/2ML IJ SOLN
INTRAMUSCULAR | Status: AC
  Filled 2014-06-12: qty 2

## 2014-06-12 MED ORDER — ENOXAPARIN SODIUM 40 MG/0.4ML ~~LOC~~ SOLN
40.0000 mg | SUBCUTANEOUS | Status: DC
  Administered 2014-06-14: 40 mg via SUBCUTANEOUS
  Filled 2014-06-12 (×3): qty 0.4

## 2014-06-12 MED ORDER — PIPERACILLIN-TAZOBACTAM 3.375 G IVPB
3.3750 g | Freq: Three times a day (TID) | INTRAVENOUS | Status: DC
  Administered 2014-06-13 – 2014-06-14 (×4): 3.375 g via INTRAVENOUS
  Filled 2014-06-12 (×6): qty 50

## 2014-06-12 MED ORDER — MIDAZOLAM HCL 5 MG/5ML IJ SOLN
INTRAMUSCULAR | Status: DC | PRN
  Administered 2014-06-12: 2 mg via INTRAVENOUS

## 2014-06-12 MED ORDER — DEXAMETHASONE SODIUM PHOSPHATE 4 MG/ML IJ SOLN
INTRAMUSCULAR | Status: AC
  Filled 2014-06-12: qty 2

## 2014-06-12 MED ORDER — NEOSTIGMINE METHYLSULFATE 10 MG/10ML IV SOLN
INTRAVENOUS | Status: DC | PRN
  Administered 2014-06-12: 2.5 mg via INTRAVENOUS

## 2014-06-12 MED ORDER — MORPHINE SULFATE 2 MG/ML IJ SOLN
2.0000 mg | INTRAMUSCULAR | Status: DC | PRN
  Administered 2014-06-13: 2 mg via INTRAVENOUS
  Filled 2014-06-12: qty 1

## 2014-06-12 SURGICAL SUPPLY — 20 items
BLADE 10 SAFETY STRL DISP (BLADE) ×2 IMPLANT
CANISTER SUCTION 2500CC (MISCELLANEOUS) ×2 IMPLANT
CONT SPEC 4OZ CLIKSEAL STRL BL (MISCELLANEOUS) IMPLANT
COVER TABLE BACK 60X90 (DRAPES) ×2 IMPLANT
DRAPE PROXIMA HALF (DRAPES) ×2 IMPLANT
GAUZE SPONGE 4X4 12PLY STRL (GAUZE/BANDAGES/DRESSINGS) ×4 IMPLANT
GLOVE BIO SURGEON STRL SZ7.5 (GLOVE) ×2 IMPLANT
GOWN STRL REUS W/ TWL LRG LVL3 (GOWN DISPOSABLE) ×2 IMPLANT
GOWN STRL REUS W/TWL LRG LVL3 (GOWN DISPOSABLE) ×2
KIT ROOM TURNOVER OR (KITS) ×2 IMPLANT
MARKER SKIN DUAL TIP RULER LAB (MISCELLANEOUS) IMPLANT
NS IRRIG 1000ML POUR BTL (IV SOLUTION) IMPLANT
PACK SURGICAL SETUP 50X90 (CUSTOM PROCEDURE TRAY) ×2 IMPLANT
PAD ARMBOARD 7.5X6 YLW CONV (MISCELLANEOUS) ×4 IMPLANT
PATTIES SURGICAL .5 X1 (DISPOSABLE) IMPLANT
SOLUTION ANTI FOG 6CC (MISCELLANEOUS) IMPLANT
SURGILUBE 2OZ TUBE FLIPTOP (MISCELLANEOUS) ×2 IMPLANT
TOWEL OR 17X24 6PK STRL BLUE (TOWEL DISPOSABLE) ×2 IMPLANT
TUBE CONNECTING 12X1/4 (SUCTIONS) ×2 IMPLANT
WATER STERILE IRR 1000ML POUR (IV SOLUTION) ×2 IMPLANT

## 2014-06-12 NOTE — Anesthesia Postprocedure Evaluation (Signed)
  Anesthesia Post-op Note  Patient: [name]  Procedure(s) Performed: Procedure(s): RIGID ESOPHAGOSCOPY WITH FOREIGN BODY REMOVAL (N/A)  Patient Location: PACU  Anesthesia Type: General   Level of Consciousness: awake, alert  and oriented  Airway and Oxygen Therapy: Patient Spontanous Breathing  Post-op Pain: mild  Post-op Assessment: Post-op Vital signs reviewed  Post-op Vital Signs: Reviewed  Last Vitals:  Filed Vitals:   06/12/14 2315  BP: 115/51  Pulse: 53  Temp:   Resp: 21    Complications: No apparent anesthesia complications

## 2014-06-12 NOTE — Anesthesia Preprocedure Evaluation (Addendum)
Anesthesia Evaluation  Patient identified by MRN, date of birth, ID band Patient awake    Reviewed: Allergy & Precautions, H&P , NPO status , Patient's Chart, lab work & pertinent test results, reviewed documented beta blocker date and time   Airway Mallampati: I TM Distance: >3 FB Neck ROM: Full    Dental  (+) Edentulous Upper, Upper Dentures, Dental Advisory Given   Pulmonary COPD COPD inhaler, former smoker,  breath sounds clear to auscultation        Cardiovascular hypertension, Pt. on medications and Pt. on home beta blockers + CAD (CABG 2003) Rhythm:Regular Rate:Normal     Neuro/Psych    GI/Hepatic   Endo/Other  Morbid obesity  Renal/GU      Musculoskeletal   Abdominal   Peds  Hematology   Anesthesia Other Findings   Reproductive/Obstetrics                         Anesthesia Physical Anesthesia Plan  ASA: III and emergent  Anesthesia Plan: General   Post-op Pain Management:    Induction: Intravenous  Airway Management Planned: Oral ETT  Additional Equipment:   Intra-op Plan:   Post-operative Plan: Extubation in OR  Informed Consent: I have reviewed the patients History and Physical, chart, labs and discussed the procedure including the risks, benefits and alternatives for the proposed anesthesia with the patient or authorized representative who has indicated his/her understanding and acceptance.   Dental advisory given  Plan Discussed with: CRNA, Anesthesiologist and Surgeon  Anesthesia Plan Comments:         Anesthesia Quick Evaluation

## 2014-06-12 NOTE — Op Note (Signed)
Date of procedure: 06/12/2014  Date of dictation: 06/12/2014  Preop diagnosis: Esophageal foreign body.  Postop diagnosis: Esophageal foreign body  Surgeon: Raylene Miyamoto, MD  Procedure performed: Rigid esophagoscopy with foreign body removal.  Anesthesia: General endotracheal tube anesthesia.  Complication: None  Estimated blood loss: Minimal  Indication for procedure: The patient is a 58 year old female who was transferred from [facility], with a diagnosis of an esophageal foreign body.  Apparently, the patient was eating a piece of chicken, when she noted throat and chest pain at 1 PM today. She was initially seen at an urgent care Center, and was subsequently referred to the [facility] emergency room. A chest CT showed a nearly 3 cm bony foreign body within the mid esophagus. The foreign body was the level of the carina. There was also a question of a possible esophageal perforation, with a small amount of air anterior to the esophagus. Based on the above findings, the decision was made for patient to undergo the rigid esophagoscopy procedure, with possible removal of the foreign body. The risks, benefits, and details of the procedure were reviewed with the patient. Questions were invited and answered. Informed consent was obtained.  Description: The patient was taken to the operating room, and was placed supine on the operating table. General endotracheal tube anesthesia was administered by the anesthesiologist. The patient was positioned and prepped and draped in a standard fashion for rigid esophagoscopy procedure. A rigid adult esophagoscope was used for the procedure. It was inserted via the oral cavity into the esophageal inlet. It was slowly advanced into the esophageal lumen. At the level of the midesophagus, a small piece of foreign body was noted. The foreign body was removed using an esophageal cut forceps. The foreign body was noted to be a piece of chicken meat and  cartilaginous tissue. The esophagoscope was slowly advanced into the distal portion of the esophagus. No other foreign body was noted. The esophagoscope was slowly withdrawn. No significant mucosal injury was noted throughout the lumen of the esophagus.   The care of the patient was turned over to the anesthesiologist. The patient was awakened from anesthesia without difficulty. She was extubated and transferred to the recovery room in good condition.  Operative findings: A small piece of chicken meat and chicken cartilage was removed from the midportion of the esophagus. No significant injury to the esophageal mucosa was noted.  Specimen: None  Followup care: The patient will be transferred back to the intensive care unit tonight. She will undergo repeat chest CT scan tomorrow morning. She will be kept N.p.o. during the interim.

## 2014-06-12 NOTE — Progress Notes (Signed)
Pt arrived to 2S05 via [facility] from [facility] ED, Pt alert and oriented. VSS. Pt complains on 7/10 pain in center of chest that radiates to back. Dr Cyndia Bent aware of pt arrival, awaiting orders.

## 2014-06-12 NOTE — Progress Notes (Signed)
58yo female swallowed a chicken bone while eating in a restaurant, seen at [facility] then [facility] ED where CT showed 3cm bone lodged in esophagus at carina level, pt c/o some CP, in OR part of foreign body was removed but rest of it may have been pushed further in GI tract, to begin IV ABX for possible perforation w/ plan for further eval in am.  Will begin Zosyn 3.375g IV Q8H (assuming SCr is similar to h/o SCr ~0.5, will f/u w/ updated SCr) and monitor CBC, Cx, and CT.  Wynona Neat, PharmD, BCPS 06/12/2014 11:23 PM

## 2014-06-12 NOTE — Anesthesia Procedure Notes (Signed)
Procedure Name: Intubation Date/Time: 06/12/2014 10:08 PM Performed by: Jacquiline Doe A Pre-anesthesia Checklist: Patient identified, Emergency Drugs available, Suction available, Patient being monitored and Timeout performed Patient Re-evaluated:Patient Re-evaluated prior to inductionOxygen Delivery Method: Circle system utilized Preoxygenation: Pre-oxygenation with 100% oxygen Intubation Type: IV induction, Rapid sequence and Cricoid Pressure applied Laryngoscope Size: Mac and 4 Grade View: Grade I Tube type: Oral Tube size: 7.5 mm Number of attempts: 1 Airway Equipment and Method: Stylet Placement Confirmation: ETT inserted through vocal cords under direct vision,  positive ETCO2 and breath sounds checked- equal and bilateral Secured at: 21 cm Tube secured with: Tape Dental Injury: Teeth and Oropharynx as per pre-operative assessment

## 2014-06-12 NOTE — Transfer of Care (Signed)
Immediate Anesthesia Transfer of Care Note  Patient: [name]  Procedure(s) Performed: Procedure(s): RIGID ESOPHAGOSCOPY WITH FOREIGN BODY REMOVAL (N/A)  Patient Location: PACU  Anesthesia Type:General  Level of Consciousness: awake, alert , oriented, patient cooperative and responds to stimulation  Airway & Oxygen Therapy: Patient Spontanous Breathing and Patient connected to face mask oxygen  Post-op Assessment: Report given to PACU RN, Post -op Vital signs reviewed and stable, Patient moving all extremities and Patient moving all extremities X 4  Post vital signs: Reviewed and stable  Complications: No apparent anesthesia complications

## 2014-06-12 NOTE — H&P (Signed)
[name] is an 58 y.o. female.   Chief Complaint:  Chicken bone lodged in esophagus HPI:   The patient is a 58 year old woman who swallowed a chicken bone about 1:30 this afternoon while eating out at a restaurant. She went to an urgent care center in [place] and then was seen at [facility] ER. CT showed a 3 cm chicken bone lodged in the esophagus at the carina level. The radiologist thought that there may be a perforation with a small amount of air anterior to the esophagus. I was called by the [facility] ER physician and the patient was transferred to the SICU here. She currently complains of some chest pain in the mid chest radiating to the mid back. She has had no vomiting. Feels like something is caught in her chest.  Past Medical History  Diagnosis Date  . Coronary artery disease   . Bronchitis   . Sinusitis   . Hypertension   . Headache   . Heart disease   . Hyperlipidemia   . CAD (coronary artery disease) 09/03/2013  . CHF (congestive heart failure)     Past Surgical History  Procedure Laterality Date  . Coronary artery bypass graft  2003    Triple bypass    Family History  Problem Relation Age of Onset  . Heart Problems Mother 100  . Heart disease Other   . Cancer Other     colon  . Stroke Other    Social History:  reports that she quit smoking about a year ago. She does not have any smokeless tobacco history on file. She reports that she drinks alcohol. She reports that she does not use illicit drugs.  Allergies: No Known Allergies  Medications Prior to Admission  Medication Sig Dispense Refill  . acetaminophen (TYLENOL) 500 MG tablet Take 1,000 mg by mouth at bedtime as needed for headache.      . albuterol (PROVENTIL HFA;VENTOLIN HFA) 108 (90 BASE) MCG/ACT inhaler Inhale 1-2 puffs into the lungs every 6 (six) hours as needed for wheezing or shortness of breath.  1 Inhaler  11  . amLODipine (NORVASC) 5 MG tablet Take 1 tablet (5 mg total) by mouth daily.  90  tablet  3  . aspirin EC 81 MG tablet Take 1 tablet (81 mg total) by mouth daily.  30 tablet  0  . atorvastatin (LIPITOR) 40 MG tablet Take 1 tablet (40 mg total) by mouth daily at 6 PM.  30 tablet  3  . carvedilol (COREG) 12.5 MG tablet Take 1 tablet (12.5 mg total) by mouth 2 (two) times daily.  60 tablet  5  . fluticasone (FLONASE) 50 MCG/ACT nasal spray Place 2 sprays into both nostrils daily.  16 g  5  . isosorbide mononitrate (IMDUR) 60 MG 24 hr tablet Take 1.5 tablets (90 mg total) by mouth daily.  45 tablet  11  . tiotropium (SPIRIVA HANDIHALER) 18 MCG inhalation capsule Place 1 capsule (18 mcg total) into inhaler and inhale daily.  30 capsule  12    No results found for this or any previous visit (from the past 48 hour(s)). No results found.  Review of Systems  Constitutional: Negative for fever, chills, weight loss and malaise/fatigue.  HENT: Negative.   Eyes: Negative.   Respiratory: Negative for cough, hemoptysis, sputum production and shortness of breath.   Cardiovascular: Negative for orthopnea, leg swelling and PND.       No chest pains prior to this  Gastrointestinal: Negative for nausea, vomiting, abdominal pain, blood in stool and melena.  Genitourinary: Negative.   Musculoskeletal: Negative.   Skin: Negative.   Neurological: Negative.   Endo/Heme/Allergies: Negative.   Psychiatric/Behavioral: Negative.     Blood pressure 162/74, pulse 85, temperature 98.1 F (36.7 C), temperature source Oral, resp. rate 18, height 5\' 1"  (1.549 m), weight 80.2 kg (176 lb 12.9 oz), SpO2 98.00%. Physical Exam  Constitutional: She is oriented to person, place, and time. She appears well-developed and well-nourished. No distress.  HENT:  Head: Normocephalic and atraumatic.  Mouth/Throat: Oropharynx is clear and moist.  Eyes: EOM are normal. Pupils are equal, round, and reactive to light.  Neck: Neck supple. No tracheal deviation present. No thyromegaly present.  Cardiovascular:  Normal rate, regular rhythm, normal heart sounds and intact distal pulses.  Exam reveals no gallop and no friction rub.   No murmur heard. Respiratory: Effort normal and breath sounds normal. No respiratory distress. She has no rales. She exhibits no tenderness.  Old sternotomy scar.  GI: Soft. Bowel sounds are normal. She exhibits no distension and no mass. There is no tenderness.  Musculoskeletal: Normal range of motion. She exhibits no edema.  Lymphadenopathy:    She has no cervical adenopathy.  Neurological: She is alert and oriented to person, place, and time.  Skin: Skin is warm and dry.  Psychiatric: She has a normal mood and affect.    CT scan from [facility]  Reviewed. There is a 3 cm chicken bone lodged at the carina level. I am not convinced that there is an esophageal perforation since there is only a tiny dot of air anterior to the esophagus and it may be in the esophageal wall. There is no other air around the esophagus or in the mediastinum.   Assessment/Plan  She has a chicken bone lodged in the mid esophagus at carina level. I discussed this with Dr. Deatra Ina of GI and he did not feel that flexible esophagoscopy was the best way to get it out and recommended rigid esophagoscopy by ENT. I discussed this with Dr. Benjamine Mola and he is willing to try. If it can be removed then we will need to reevaluate the esophagus with CT or esophagram. I discussed the possibility that we can't get it out with esophagoscopy with the patient and her family and then thoracotomy for surgical removal would be the only option. If there is any significant perforation of the esophagus, then thoracotomy for repair would be indicated. I discussed the benefits and risks including but not limited to bleeding, blood transfusion, infection, esophageal leak, possible need for further surgery, organ dysfunction and death. She understands and agrees to proceed.  [name] 06/12/2014, 9:20 PM

## 2014-06-12 NOTE — Progress Notes (Addendum)
Patient ID: [name], female   DOB: October 21, 1956, 58 y.o.   MRN: [id number]  Cardiothoracic Surgery  Dr. Benjamine Mola was able to remove part of the foreign body and probably pushed the remainder through to distal esophagus or stomach. The rigid scope was advanced almost to the GEJ and no further foreign body was seen. The scope was withdrawn slowly and the mucosa appeared intact without visible injury. Will get a chest xray after the procedure and plan to do a CT scan with contrast early in the morning. I am hesitant to send her down for CT soon after general anesthesia so I think waiting until the am is probably safest. Will keep NPO and continue Zosyn.

## 2014-06-13 ENCOUNTER — Encounter (HOSPITAL_COMMUNITY): Payer: Self-pay

## 2014-06-13 ENCOUNTER — Encounter (HOSPITAL_COMMUNITY): Admission: EM | Disposition: A | Discharge status: Home / Self Care | Payer: Self-pay | Attending: Surgery

## 2014-06-13 ENCOUNTER — Inpatient Hospital Stay (HOSPITAL_COMMUNITY): Payer: BC Managed Care – PPO

## 2014-06-13 HISTORY — PX: ESOPHAGOGASTRODUODENOSCOPY: SHX5428

## 2014-06-13 LAB — CBC
HCT: 45.4 % (ref 36.0–46.0)
Hemoglobin: 15.1 g/dL — ABNORMAL HIGH (ref 12.0–15.0)
MCH: 29.4 pg (ref 26.0–34.0)
MCHC: 33.3 g/dL (ref 30.0–36.0)
MCV: 88.3 fL (ref 78.0–100.0)
PLATELETS: 160 10*3/uL (ref 150–400)
RBC: 5.14 MIL/uL — AB (ref 3.87–5.11)
RDW: 14 % (ref 11.5–15.5)
WBC: 8.7 10*3/uL (ref 4.0–10.5)

## 2014-06-13 LAB — BASIC METABOLIC PANEL
ANION GAP: 11 (ref 5–15)
BUN: 10 mg/dL (ref 6–23)
CHLORIDE: 104 meq/L (ref 96–112)
CO2: 25 mEq/L (ref 19–32)
Calcium: 8.7 mg/dL (ref 8.4–10.5)
Creatinine, Ser: 0.5 mg/dL (ref 0.50–1.10)
GFR calc non Af Amer: >90 mL/min (ref >90)
Glucose, Bld: 178 mg/dL — ABNORMAL HIGH (ref 70–99)
POTASSIUM: 4.2 meq/L (ref 3.7–5.3)
Sodium: 140 mEq/L (ref 137–147)

## 2014-06-13 LAB — GLUCOSE, CAPILLARY
GLUCOSE-CAPILLARY: 125 mg/dL — AB (ref 70–99)
Glucose-Capillary: 203 mg/dL — ABNORMAL HIGH (ref 70–99)

## 2014-06-13 LAB — TYPE AND SCREEN
ABO/RH(D): O NEG
ANTIBODY SCREEN: NEGATIVE

## 2014-06-13 LAB — ABO/RH: ABO/RH(D): O NEG

## 2014-06-13 SURGERY — EGD (ESOPHAGOGASTRODUODENOSCOPY)
Anesthesia: Moderate Sedation | Laterality: Left

## 2014-06-13 MED ORDER — FENTANYL CITRATE 0.05 MG/ML IJ SOLN
INTRAMUSCULAR | Status: DC | PRN
  Administered 2014-06-13 (×2): 25 ug via INTRAVENOUS

## 2014-06-13 MED ORDER — SODIUM CHLORIDE 0.9 % IV SOLN
INTRAVENOUS | Status: DC
  Administered 2014-06-13: 500 mL via INTRAVENOUS

## 2014-06-13 MED ORDER — FENTANYL CITRATE 0.05 MG/ML IJ SOLN
INTRAMUSCULAR | Status: AC
  Filled 2014-06-13: qty 2

## 2014-06-13 MED ORDER — PANTOPRAZOLE SODIUM 40 MG PO TBEC
40.0000 mg | DELAYED_RELEASE_TABLET | Freq: Two times a day (BID) | ORAL | Status: DC
  Administered 2014-06-13 – 2014-06-15 (×4): 40 mg via ORAL
  Filled 2014-06-13 (×4): qty 1

## 2014-06-13 MED ORDER — SUCRALFATE 1 GM/10ML PO SUSP
1.0000 g | Freq: Four times a day (QID) | ORAL | Status: DC
  Administered 2014-06-13 – 2014-06-15 (×7): 1 g via ORAL
  Filled 2014-06-13 (×11): qty 10

## 2014-06-13 MED ORDER — MIDAZOLAM HCL 5 MG/ML IJ SOLN
INTRAMUSCULAR | Status: AC
  Filled 2014-06-13: qty 2

## 2014-06-13 MED ORDER — IOHEXOL 300 MG/ML  SOLN
80.0000 mL | Freq: Once | INTRAMUSCULAR | Status: AC | PRN
  Administered 2014-06-13: 80 mL via INTRAVENOUS

## 2014-06-13 MED ORDER — MIDAZOLAM HCL 10 MG/2ML IJ SOLN
INTRAMUSCULAR | Status: DC | PRN
  Administered 2014-06-13 (×3): 2 mg via INTRAVENOUS

## 2014-06-13 MED ORDER — CETYLPYRIDINIUM CHLORIDE 0.05 % MT LIQD
7.0000 mL | Freq: Two times a day (BID) | OROMUCOSAL | Status: DC
  Administered 2014-06-13 – 2014-06-14 (×2): 7 mL via OROMUCOSAL

## 2014-06-13 NOTE — Consult Note (Signed)
Unassigned patient Reason for Consult: Chicken bone in the esophagus.  Referring Physician: Dr. Gilford Raid.  [name] is an 58 y.o. female.  HPI: 58 year old female, with multiple medical issues listed below, presented to [facility] via an Urgent Care after she had a piece of chicken bone stuck in her esophagus. There was a question of some air anterior to the esophagus and therefore she was transferred to [facility] for further management. She had a rigid esophagoscopy done yesterday by Dr. Benjamine Mola, when the majority of the chicken and the cartilage stuck in the mid-esophagus was removed. A repeat CT scan revealed a linear density in the distal esophagus ?chicken bone. Therefore a repeat EGD has been planned for today. She has had some dysphagia and worsening reflux since yesterday. There is no history of hematemesis, melena or hematochezia. She has a good appetite and her weight has been stable.    Past Medical History  Diagnosis Date  . Coronary artery disease   . Bronchitis   . Sinusitis   . Hypertension   . Headache   . Heart disease   . Hyperlipidemia   . CAD (coronary artery disease) 09/03/2013  . CHF (congestive heart failure)       GERD  Past Surgical History  Procedure Laterality Date  . Coronary artery bypass graft  2003    Triple bypass  . Rigid esophagoscopy N/A 06/12/2014    Procedure: RIGID ESOPHAGOSCOPY WITH FOREIGN BODY REMOVAL;  Surgeon: Ascencion Dike, MD;  Location: [facility] OR;  Service: ENT;  Laterality: N/A;   Family History  Problem Relation Age of Onset  . Heart Problems Mother 18  . Heart disease Other   . Cancer Other     colon  . Stroke Other    Social History:  reports that she has been smoking Cigarettes.  She has been smoking about 0.25 packs per day. She does not have any smokeless tobacco history on file. She reports that she drinks alcohol. She reports that she does not use illicit drugs.  Allergies: No Known Allergies  Medications: I have reviewed the patient's  current medications.  Results for orders placed during the hospital encounter of 06/12/14 (from the past 48 hour(s))  SURGICAL PCR SCREEN     Status: None   Collection Time    06/12/14  9:13 PM      Result Value Ref Range   MRSA, PCR NEGATIVE  NEGATIVE   Staphylococcus aureus NEGATIVE  NEGATIVE   Comment:            The Xpert SA Assay (FDA     approved for NASAL specimens     in patients over 10 years of age),     is one component of     a comprehensive surveillance     program.  Test performance has     been validated by [employer] for patients greater     than or equal to 41 year old.     It is not intended     to diagnose infection nor to     guide or monitor treatment.  CBC     Status: Abnormal   Collection Time    06/13/14  2:50 AM      Result Value Ref Range   WBC 8.7  4.0 - 10.5 K/uL   RBC 5.14 (*) 3.87 - 5.11 MIL/uL   Hemoglobin 15.1 (*) 12.0 - 15.0 g/dL   HCT 45.4  36.0 - 46.0 %   MCV 88.3  78.0 - 100.0 fL   MCH 29.4  26.0 - 34.0 pg   MCHC 33.3  30.0 - 36.0 g/dL   RDW 14.0  11.5 - 15.5 %   Platelets 160  150 - 400 K/uL  BASIC METABOLIC PANEL     Status: Abnormal   Collection Time    06/13/14  2:50 AM      Result Value Ref Range   Sodium 140  137 - 147 mEq/L   Potassium 4.2  3.7 - 5.3 mEq/L   Chloride 104  96 - 112 mEq/L   CO2 25  19 - 32 mEq/L   Glucose, Bld 178 (*) 70 - 99 mg/dL   BUN 10  6 - 23 mg/dL   Creatinine, Ser 0.50  0.50 - 1.10 mg/dL   Calcium 8.7  8.4 - 10.5 mg/dL   GFR calc non Af Amer >90  >90 mL/min   GFR calc Af Amer >90  >90 mL/min   Comment: (NOTE)     The eGFR has been calculated using the CKD EPI equation.     This calculation has not been validated in all clinical situations.     eGFR's persistently <90 mL/min signify possible Chronic Kidney     Disease.   Anion gap 11  5 - 15  TYPE AND SCREEN     Status: None   Collection Time    06/13/14  2:50 AM      Result Value Ref Range   ABO/RH(D) O NEG     Antibody Screen NEG      Sample Expiration 06/16/2014    ABO/RH     Status: None   Collection Time    06/13/14  2:50 AM      Result Value Ref Range   ABO/RH(D) O NEG     Ct Chest W Contrast  06/13/2014   CLINICAL DATA:  Removal of esophageal fall body.  EXAM: CT CHEST WITH CONTRAST  TECHNIQUE: Multidetector CT imaging of the chest was performed during intravenous contrast administration.  CONTRAST:  77mL OMNIPAQUE IOHEXOL 300 MG/ML  SOLN  COMPARISON:  Chest x-ray 06/12/2014.  Chest CT 06/12/2014  FINDINGS: Atherosclerotic vascular changes noted of the thoracic aorta. Pulmonary arteries are normal. Cardiomegaly. Coronary artery disease.  No significant mediastinal adenopathy. Tiny linear density is noted in the distal esophagus. This could represent residual chicken bone which was noted on the prior CT of 06/12/2014. Suture line or hemorrhage cannot be completely excluded. Paraesophageal structures appear unremarkable. No evidence of pneumomediastinum.  Large airways are patent. Basilar atelectasis. Small pleural effusions.  Visualized upper abdominal structures are are unremarkable.  Thyroid unremarkable.  Degenerative changes thoracic spine.  IMPRESSION: 1. Residual linear density is noted distal esophagus. This could represent residual chicken bone fragment. Large portion of previously identified chicken bone is been removed. The possibility of focal tiny hemorrhage or suture line presenting in this fashion should also be considered. 2. Bibasilar atelectasis.  Small pleural effusions. 3. Coronary artery disease. These results will be called to the ordering clinician or representative by the Radiologist Assistant, and communication documented in the PACS or zVision Dashboard.   Electronically Signed   By: Marcello Moores  Register   On: 06/13/2014 08:29   Portable Chest 1 View  06/12/2014   CLINICAL DATA:  Post rigid esophagoscopy. Previous CT scan demonstrated bone in the mid esophagus with evidence of perforation.  EXAM: PORTABLE CHEST  - 1 VIEW  COMPARISON:  CT chest and chest radiograph 06/12/2014.  FINDINGS: Postoperative changes in the mediastinum. Shallow inspiration. Mild cardiac enlargement with mild vascular congestion. No focal airspace disease or consolidation. No definite evidence of mediastinal gas. No pneumothorax visualized.  IMPRESSION: Cardiac enlargement with mild vascular congestion. No edema. Foreign body and paraesophageal gas seen on CT are not identified radiographically.   Electronically Signed   By: Lucienne Capers M.D.   On: 06/12/2014 23:29   Review of Systems  Constitutional: Negative.   Eyes: Negative.   Respiratory: Negative.   Cardiovascular: Negative.   Gastrointestinal: Positive for heartburn.  Musculoskeletal: Positive for back pain.  Skin: Negative.   Neurological: Positive for headaches.  Endo/Heme/Allergies: Negative.    Blood pressure 131/51, pulse 55, temperature 97.5 F (36.4 C), temperature source Oral, resp. rate 17, height $RemoveBe'5\' 1"'[id number]$  (1.549 m), weight 80.2 kg (176 lb 12.9 oz), SpO2 96.00%. Physical Exam  Constitutional: She is oriented to person, place, and time. She appears well-developed and well-nourished.  HENT:  Head: Normocephalic and atraumatic.  Eyes: Conjunctivae and EOM are normal. Pupils are equal, round, and reactive to light.  Neck: Normal range of motion. Neck supple.  Cardiovascular: Normal rate and regular rhythm.   Respiratory: Effort normal and breath sounds normal.  Midline scar on chest from previous CABG  GI: Soft. Bowel sounds are normal.  Musculoskeletal: Normal range of motion.  Neurological: She is alert and oriented to person, place, and time.  Skin: Skin is warm and dry.  Psychiatric: She has a normal mood and affect. Her behavior is normal. Judgment and thought content normal.   Assessment/Plan: 1) Abnormal CT scan of the chest ?bone stuck in distal esophagus-patient has had some dysphagia with worsening reflux: Proceed with an EGD at this time. Further  recommendations will be made after the EGD has been done.  2) CAD/HTN/S/P CABG.  3) COPD-smoker for 40 years.   Irmgard Rampersaud 06/13/2014, 3:43 PM

## 2014-06-13 NOTE — Op Note (Signed)
[facility] [place], [id number]   OPERATIVE PROCEDURE REPORT  PATIENT :[name], [name]  MR#: [id number] BIRTHDATE :11/23/1955 GENDER: Female ENDOSCOPIST: Edmonia James, MD ASSISTANT:   Tedra Coupe, Cleda Daub, RN CGRN PROCEDURE DATE: 05-Jul-2014 PRE-PROCEDURE PREPERATION: Patient fasted for 8 hours prior to the procedure. PRE-PROCEDURE PHYSICAL: Patient has stable vital signs.  Neck is supple.  There is no JVD, thyromegaly or LAD.  Chest clear to auscultation.  S1 and S2 regular.  Abdomen soft, non-distended, non-tender with NABS. PROCEDURE:     EGD with foreign body removal and antral biopsies.  ASA CLASS:     Class III INDICATIONS:     1) Abnormal CT scan ?bone in distal esophagus 2) Dysphagia 3) GERD. MEDICATIONS:     Fentanyl 50 mcg and Versed 6 mg IV TOPICAL ANESTHETIC:   none given.  DESCRIPTION OF PROCEDURE: After the risks benefits and alternatives of the procedure were thoroughly explained, informed consent was obtained.  The Pentax Gastroscope [id number]  was introduced through the mouth and advanced to the second portion of the duodenum , without limitations. The instrument was slowly withdrawn as the mucosa was fully examined.   The esophagus was widely patent with ulceration at 25 cm where I suspecct the food impaction was removed from yesterday. There was a V-shaped "wish bone"  impacted at the distal end of the esopahgus above the GEJ that was gently pushed into the stomach. There were multiple antral eroisons and these were biopsied to rule out H. pylori by pathology. Mild duodenitis was noted in the duodenal bulb. The small bowel distal to the bulb appeared normal. There was patchy gastritis noted in the cardia on retroflexion. No ulcers, masses or polyps noted. The scope was then withdrawn from the patient and the procedure terminated. The patient tolerated the procedure without immediate  complications.     IMPRESSION:  1) Pathcy ulceration in the mid-esophagus at 25 cm at the site of previous impaction. 2) V-shaped "wish bone" impacted in the distal esophagus-gently pushed into the stomach. 3) Patchy gastritis in the cardia. 4) Mltiple erosions in the antrum-biopsies. 5) Mild duodenitis in the duodenal bulb. 6) Normal small bowel distal to the bulb.  RECOMMENDATIONS:     1.  Anti-reflux regimen to be followed. 2.  Avoid NSAIDS for the next 4 weeks. 3.  Await pathology results. 4.  Continue PPI's. 5.  Continue current medications. 6.  Start PPI's BID PO. 7. Carafate suspension 1 gram QID PO for 2 weeks. 8. OP follow-up in 2 weeks.  REPEAT EXAM:     No recall planned for now.  DISCHARGE INSTRUCTIONS: standard discharge instructions given. _______________________________ eSigned:  Dr. Edmonia James, MD Jul 05, 2014 4:39 PM   CPT CODES:     [phone] EGD with foreign body removal 43239, EGD with biopsies  DIAGNOSIS CODES:     793.4 Abnormal CT scan 787.20 Dysphagia 530.81 GERD   CC: Cathlean Cower, M.D.  PATIENT NAME:  Barrett, Holthaus MR#: [id number]

## 2014-06-13 NOTE — Progress Notes (Signed)
1 Day Post-Op Procedure(s) (LRB): RIGID ESOPHAGOSCOPY WITH FOREIGN BODY REMOVAL (N/A) Subjective: No complaints  Feels much better. Pain is gone  Objective: Vital signs in last 24 hours: Temp:  [97.4 F (36.3 C)-98.1 F (36.7 C)] 98 F (36.7 C) (08/24 0400) Pulse Rate:  [51-85] 82 (08/24 0700) Cardiac Rhythm:  [-] Normal sinus rhythm (08/24 0600) Resp:  [16-29] 19 (08/24 0700) BP: (92-162)/(43-74) 133/69 mmHg (08/24 0700) SpO2:  [91 %-100 %] 91 % (08/24 0700) Weight:  [80.2 kg (176 lb 12.9 oz)] 80.2 kg (176 lb 12.9 oz) (08/23 2030)  Hemodynamic parameters for last 24 hours:    Intake/Output from previous day: 08/23 0701 - 08/24 0700 In: 2118.3 [I.V.:2018.3; IV Piggyback:100] Out: 405 [Urine:400; Blood:5] Intake/Output this shift:    General appearance: alert and cooperative Heart: regular rate and rhythm, S1, S2 normal, no murmur, click, rub or gallop Lungs: clear to auscultation bilaterally Abdomen: soft, non-tender; bowel sounds normal; no masses,  no organomegaly no tenderness or crepitance in the neck  Lab Results:  Recent Labs  06/13/14 0250  WBC 8.7  HGB 15.1*  HCT 45.4  PLT 160   BMET:  Recent Labs  06/13/14 0250  NA 140  K 4.2  CL 104  CO2 25  GLUCOSE 178*  BUN 10  CREATININE 0.50  CALCIUM 8.7    PT/INR: No results found for this basename: LABPROT, INR,  in the last 72 hours ABG    Component Value Date/Time   PHART 7.287* 02/01/2014 1809   HCO3 31.7* 02/01/2014 1809   TCO2 34 02/01/2014 1809   O2SAT 92.0 02/01/2014 1809   CBG (last 3)  No results found for this basename: GLUCAP,  in the last 72 hours  Assessment/Plan: S/P Procedure(s) (LRB): RIGID ESOPHAGOSCOPY WITH FOREIGN BODY REMOVAL (N/A)  She is doing well clinically with resolution of her symptoms. I have reviewed her CT this am. It has not been read by radiology yet. There is no sign of esophageal perforation with no air or fluid outside the esophagus. The dense foreign body seen  on the initial CT is now seen distally in the esophagus almost at the GEJ. My feeling is that this was not a chicken bone but some flexible cartilage that was lodged in the mid esophagus. Part of it was removed with rigid esophagoscopy and the remainder was pushed distally with the scope during the procedure. The rigid scope was in as far as possible and we could not see any further foreign body. I don't know if this will pass on its own through the GEJ. I have asked GI medicine to see her to decide if flexible esophagoscopy is warranted to assess this and possibly push it through into the stomach.   LOS: 1 day    BARTLE,BRYAN K 06/13/2014

## 2014-06-13 NOTE — Progress Notes (Signed)
CT surgery p.m. Rounds  Patient resting comfortably after endoscopy which removed foreign body from distal esophagus Patient symptomatically improved Gastric erosions noted on the endoscopy We'll start clear liquids later today No epigastric tenderness, heart rate 80 and regular

## 2014-06-13 NOTE — Progress Notes (Signed)
Utilization review completed. Ming Kunka, RN, BSN. 

## 2014-06-13 NOTE — Progress Notes (Signed)
Day of Surgery Procedure(s) (LRB): ESOPHAGOGASTRODUODENOSCOPY (EGD) (Left) Subjective: No complaints.  Wishbone shaped foreign body pushed into stomach from distal esophagus by EGD this afternoon. She feels fine.  Objective: Vital signs in last 24 hours: Temp:  [97.4 F (36.3 C)-98.1 F (36.7 C)] 97.5 F (36.4 C) (08/24 0814) Pulse Rate:  [51-107] 53 (08/24 1656) Cardiac Rhythm:  [-] Sinus bradycardia;Normal sinus rhythm (08/24 0830) Resp:  [11-29] 12 (08/24 1627) BP: (92-177)/(39-107) 112/51 mmHg (08/24 1656) SpO2:  [91 %-100 %] 99 % (08/24 1656) Weight:  [80.2 kg (176 lb 12.9 oz)] 80.2 kg (176 lb 12.9 oz) (08/23 2030)  Hemodynamic parameters for last 24 hours:    Intake/Output from previous day: 08/23 0701 - 08/24 0700 In: 2118.3 [I.V.:2018.3; IV Piggyback:100] Out: 405 [Urine:400; Blood:5] Intake/Output this shift: Total I/O In: 1125 [I.V.:1075; IV Piggyback:50] Out: 400 [Urine:400]  She looks comfortable Abdomen soft Lungs clear  Lab Results:  Recent Labs  06/13/14 0250  WBC 8.7  HGB 15.1*  HCT 45.4  PLT 160   BMET:  Recent Labs  06/13/14 0250  NA 140  K 4.2  CL 104  CO2 25  GLUCOSE 178*  BUN 10  CREATININE 0.50  CALCIUM 8.7    PT/INR: No results found for this basename: LABPROT, INR,  in the last 72 hours ABG    Component Value Date/Time   PHART 7.287* 02/01/2014 1809   HCO3 31.7* 02/01/2014 1809   TCO2 34 02/01/2014 1809   O2SAT 92.0 02/01/2014 1809   CBG (last 3)  No results found for this basename: GLUCAP,  in the last 72 hours  Assessment/Plan: S/P Procedure(s) (LRB): ESOPHAGOGASTRODUODENOSCOPY (EGD) (Left)  Stable after EGD today to push chicken bone into stomach. Will start clear liquids and ppi, carafate per GI.   LOS: 1 day    Atiyah Bauer K 06/13/2014

## 2014-06-13 NOTE — Progress Notes (Signed)
Subjective: Pt reports feeling better. No pain with swallowing. Denies any chest pain.  Objective: Vital signs in last 24 hours: Temp:  [97.4 F (36.3 C)-98.1 F (36.7 C)] 97.5 F (36.4 C) (08/24 0814) Pulse Rate:  [51-85] 57 (08/24 1100) Resp:  [14-29] 15 (08/24 1100) BP: (92-162)/(43-74) 114/55 mmHg (08/24 1100) SpO2:  [91 %-100 %] 96 % (08/24 1100) Weight:  [176 lb 12.9 oz (80.2 kg)] 176 lb 12.9 oz (80.2 kg) (08/23 2030)  General appearance: alert, cooperative and no distress Head: Normocephalic, without obvious abnormality, atraumatic Eyes: conjunctivae/corneas clear. PERRL, EOM's intact. Fundi benign. Ears: normal TM's and external ear canals both ears Nose: Nares normal. Septum midline. Mucosa normal. No drainage or sinus tenderness. Throat: lips, mucosa, and tongue normal; teeth and gums normal Neck: no adenopathy, no carotid bruit, no JVD, supple, symmetrical, trachea midline and thyroid not enlarged, symmetric, no tenderness/mass/nodules Resp: clear to auscultation bilaterally   Recent Labs  06/13/14 0250  WBC 8.7  HGB 15.1*  HCT 45.4  PLT 160    Recent Labs  06/13/14 0250  NA 140  K 4.2  CL 104  CO2 25  GLUCOSE 178*  BUN 10  CREATININE 0.50  CALCIUM 8.7    Medications:  I have reviewed the patient's current medications. Scheduled: . sodium chloride   Intravenous Once  . antiseptic oral rinse  7 mL Mouth Rinse BID  . enoxaparin (LOVENOX) injection  40 mg Subcutaneous Q24H  . piperacillin-tazobactam (ZOSYN)  IV  3.375 g Intravenous Q8H  . sodium chloride  3 mL Intravenous Q12H   BWL:SLHTDSKAJGOTL (DILAUDID) injection, morphine injection, ondansetron (ZOFRAN) IV, ondansetron  Assessment/Plan: Pt is doing well s/p esophagoscopy with FB removal.  Her pain has resolved. Chest CT shows only a small linear density at the GE junction. ? Flexible upper endoscopy by GI? Will follow.   LOS: 1 day   Jenasia Dolinar,SUI W 06/13/2014, 12:16 PM

## 2014-06-14 ENCOUNTER — Encounter (HOSPITAL_COMMUNITY): Payer: Self-pay | Admitting: Gastroenterology

## 2014-06-14 LAB — POCT I-STAT 4, (NA,K, GLUC, HGB,HCT)
Glucose, Bld: 115 mg/dL — ABNORMAL HIGH (ref 70–99)
HEMATOCRIT: 48 % — AB (ref 36.0–46.0)
Hemoglobin: 16.3 g/dL — ABNORMAL HIGH (ref 12.0–15.0)
Potassium: 3.7 mEq/L (ref 3.7–5.3)
SODIUM: 141 meq/L (ref 137–147)

## 2014-06-14 LAB — GLUCOSE, CAPILLARY: Glucose-Capillary: 154 mg/dL — ABNORMAL HIGH (ref 70–99)

## 2014-06-14 MED ORDER — ALBUTEROL SULFATE (2.5 MG/3ML) 0.083% IN NEBU: 2.5000 mg | INHALATION_SOLUTION | Freq: Four times a day (QID) | RESPIRATORY_TRACT | Status: DC | PRN

## 2014-06-14 MED ORDER — FLUTICASONE PROPIONATE 50 MCG/ACT NA SUSP
2.0000 | Freq: Every day | NASAL | Status: DC
  Administered 2014-06-14 – 2014-06-15 (×2): 2 via NASAL
  Filled 2014-06-14: qty 16

## 2014-06-14 MED ORDER — AMLODIPINE BESYLATE 5 MG PO TABS
5.0000 mg | ORAL_TABLET | Freq: Every day | ORAL | Status: DC
  Administered 2014-06-14 – 2014-06-15 (×2): 5 mg via ORAL
  Filled 2014-06-14 (×2): qty 1

## 2014-06-14 MED ORDER — ISOSORBIDE MONONITRATE ER 60 MG PO TB24
90.0000 mg | ORAL_TABLET | Freq: Every day | ORAL | Status: DC
  Administered 2014-06-14: 90 mg via ORAL
  Filled 2014-06-14 (×2): qty 1

## 2014-06-14 MED ORDER — TIOTROPIUM BROMIDE MONOHYDRATE 18 MCG IN CAPS
18.0000 ug | ORAL_CAPSULE | Freq: Every day | RESPIRATORY_TRACT | Status: DC
  Administered 2014-06-15: 18 ug via RESPIRATORY_TRACT
  Filled 2014-06-14: qty 5

## 2014-06-14 MED ORDER — ASPIRIN EC 81 MG PO TBEC
81.0000 mg | DELAYED_RELEASE_TABLET | Freq: Every day | ORAL | Status: DC
  Administered 2014-06-15: 81 mg via ORAL
  Filled 2014-06-14 (×2): qty 1

## 2014-06-14 MED ORDER — ACETAMINOPHEN 325 MG PO TABS
650.0000 mg | ORAL_TABLET | Freq: Four times a day (QID) | ORAL | Status: DC | PRN
  Administered 2014-06-14: 650 mg via ORAL
  Filled 2014-06-14: qty 2

## 2014-06-14 MED ORDER — ATORVASTATIN CALCIUM 40 MG PO TABS
40.0000 mg | ORAL_TABLET | Freq: Every day | ORAL | Status: DC
  Administered 2014-06-14: 40 mg via ORAL
  Filled 2014-06-14 (×2): qty 1

## 2014-06-14 NOTE — Progress Notes (Signed)
1 Day Post-Op Procedure(s) (LRB): ESOPHAGOGASTRODUODENOSCOPY (EGD) (Left) Subjective: No complaints. Feels much better. Tolerated liquids well  Objective: Vital signs in last 24 hours: Temp:  [97.4 F (36.3 C)-98 F (36.7 C)] 97.6 F (36.4 C) (08/25 0700) Pulse Rate:  [46-107] 54 (08/25 0800) Cardiac Rhythm:  [-] Sinus bradycardia;Normal sinus rhythm (08/25 0756) Resp:  [6-23] 18 (08/25 0800) BP: (102-177)/(35-107) 136/65 mmHg (08/25 0800) SpO2:  [92 %-100 %] 96 % (08/25 0800)  Hemodynamic parameters for last 24 hours:    Intake/Output from previous day: 08/24 0701 - 08/25 0700 In: 2485.8 [I.V.:2335.8; IV Piggyback:150] Out: 1500 [Urine:1500] Intake/Output this shift: Total I/O In: 75 [I.V.:75] Out: -   General appearance: alert and cooperative Heart: regular rate and rhythm, S1, S2 normal, no murmur, click, rub or gallop Lungs: clear to auscultation bilaterally Abdomen: soft, non-tender; bowel sounds normal; no masses,  no organomegaly  Lab Results:  Recent Labs  06/13/14 0250  WBC 8.7  HGB 15.1*  HCT 45.4  PLT 160   BMET:  Recent Labs  06/13/14 0250  NA 140  K 4.2  CL 104  CO2 25  GLUCOSE 178*  BUN 10  CREATININE 0.50  CALCIUM 8.7    PT/INR: No results found for this basename: LABPROT, INR,  in the last 72 hours ABG    Component Value Date/Time   PHART 7.287* 02/01/2014 1809   HCO3 31.7* 02/01/2014 1809   TCO2 34 02/01/2014 1809   O2SAT 92.0 02/01/2014 1809   CBG (last 3)   Recent Labs  06/13/14 1920 06/13/14 2344 06/14/14 0347  GLUCAP 203* 125* 154*    Assessment/Plan: S/P Procedure(s) (LRB): ESOPHAGOGASTRODUODENOSCOPY (EGD) (Left) Removal of chicken bone from esophagus No signs of perforation. Advance to regular diet Stop antibiotic Continue protonix and carafate for erosions Transfer to 2W and mobilize If she tolerates regular diet she can go home in the am Will need to follow up with GI in 2 weeks. See the op note.   LOS: 2  days    Gilford Raid K 06/14/2014

## 2014-06-14 NOTE — Discharge Instructions (Signed)
You may continue a regular diet.   Call if you develop chest pain, difficulty swallowing, vomiting, or fever. You may resume your normal activities.

## 2014-06-14 NOTE — Discharge Summary (Signed)
[address]       [place] [address]             [phone]              Discharge Summary  Name: [name] DOB: Feb 07, 1956 58 y.o. MRN: [id number]   Admission Date: 06/12/2014 Discharge Date: 06/16/2014    Admitting Diagnosis: Active Problems:   Esophagus, foreign body    Discharge Diagnosis:  Active Problems:   Esophagus, foreign body  Past Medical History  Diagnosis Date  . Coronary artery disease   . Bronchitis   . Sinusitis   . Hypertension   . Headache   . Heart disease   . Hyperlipidemia   . CAD (coronary artery disease) 09/03/2013  . CHF (congestive heart failure)      Procedures:  RIGID ESOPHAGOSCOPY WITH FOREIGN BODY REMOVAL - 06/12/2014  ESOPHAGOGASTRODUODENOSCOPY WITH REMOVAL OF FOREIGN BODY AND ANTRAL BIOPSIES  - 06/13/2014   HPI:  The patient is a 58 y.o. female who swallowed a chicken bone while eating out at a restaurant on the date of admission. She went to an urgent care center in [place] and then was seen at [facility] ER. CT showed a 3 cm chicken bone lodged in the esophagus at the carina level. The radiologist thought that there may be a perforation with a small amount of air anterior to the esophagus. She complained of some chest pain in the mid chest radiating to the mid back, but has had no vomiting. She stats she feels like something is caught in her chest. A thoracic surgery consult was requested, and the patient was transferred to [facility] under the care of Dr. Cyndia Bent for further evaluation and treatment.     Hospital Course:  The patient was admitted to [facility] on 06/12/2014. Her case was discussed with Dr. Deatra Ina of GI and he did not feel that flexible esophagoscopy was the best way to get it out and recommended rigid esophagoscopy by ENT. This was discussed with Dr. Benjamine Mola, and he did perform a rigid esophagoscopy on 8/23, removing a small part of the foreign body from the midportion of the  esophagus. She improved clinically, but follow up CT showed that there was a small linear density seen distally near the GE junction, which was thought to be retained cartilage. A gastroenterology consult was requested and an EGD was performed on 8/24. This revealed a patchy ulceration in the mid-esophagus, with an area of foreign body impacted at the distal esophagus which was gently pushed into the stomach, patchy gastritis, multiple erosions in the antrum and mild duodenitis.  Biopsies were taken and and the patient was started on Carafate and Protonix.   The patient is clinically progressing well.  She tolerated liquids and is presently being advanced to a regular diet.  She has had no further pain or dysphagia.  She is ambulating in the halls, and vital signs have been stable.  We anticipate discharge home in the next 24 hours provided no acute changes occur.    Recent vital signs:  Filed Vitals:   06/15/14 0539  BP: 109/45  Pulse: 50  Temp: 97.6 F (36.4 C)  Resp: 20    Recent laboratory studies:  CBC:  Recent Labs  06/12/14 2145 06/13/14 0250  WBC  --  8.7  HGB 16.3* 15.1*  HCT 48.0* 45.4  PLT  --  160   BMET:   Recent Labs  06/12/14 2145  06/13/14 0250  NA 141 140  K 3.7 4.2  CL  --  104  CO2  --  25  GLUCOSE 115* 178*  BUN  --  10  CREATININE  --  0.50  CALCIUM  --  8.7    PT/INR: No results found for this basename: LABPROT, INR,  in the last 72 hours   Discharge Medications:     Medication List         acetaminophen 500 MG tablet  Commonly known as:  TYLENOL  Take 1,000 mg by mouth at bedtime as needed for headache.     albuterol 108 (90 BASE) MCG/ACT inhaler  Commonly known as:  PROVENTIL HFA;VENTOLIN HFA  Inhale 1-2 puffs into the lungs every 6 (six) hours as needed for wheezing or shortness of breath.     amLODipine 5 MG tablet  Commonly known as:  NORVASC  Take 1 tablet (5 mg total) by mouth daily.     aspirin EC 81 MG tablet  Take 1 tablet  (81 mg total) by mouth daily.     atorvastatin 40 MG tablet  Commonly known as:  LIPITOR  Take 1 tablet (40 mg total) by mouth daily at 6 PM.     carvedilol 12.5 MG tablet  Commonly known as:  COREG  Take 1 tablet (12.5 mg total) by mouth 2 (two) times daily.     fluticasone 50 MCG/ACT nasal spray  Commonly known as:  FLONASE  Place 2 sprays into both nostrils daily.     isosorbide mononitrate 60 MG 24 hr tablet  Commonly known as:  IMDUR  Take 1.5 tablets (90 mg total) by mouth daily.     pantoprazole 40 MG tablet  Commonly known as:  PROTONIX  Take 1 tablet (40 mg total) by mouth 2 (two) times daily.     sucralfate 1 GM/10ML suspension  Commonly known as:  CARAFATE  Take 10 mLs (1 g total) by mouth every 6 (six) hours.     tiotropium 18 MCG inhalation capsule  Commonly known as:  SPIRIVA HANDIHALER  Place 1 capsule (18 mcg total) into inhaler and inhale daily.         Discharge Instructions:  May resume regular diet. Increase activity as tolerated.   Follow Up:   Follow-up Information   Follow up with MANN,JYOTHI, MD In 2 weeks. (Call to schedule appointment)    Specialty:  Gastroenterology   Contact information:   [address] [place] [address] [phone]       Nani Skillern PA-C 06/15/2014, 8:58 AM

## 2014-06-14 NOTE — Progress Notes (Signed)
Subjective: Pt reports feeling better. No pain or discomfort.   Objective: Vital signs in last 24 hours: Temp:  [97.4 F (36.3 C)-98 F (36.7 C)] 97.6 F (36.4 C) (08/25 0700) Pulse Rate:  [46-107] 54 (08/25 0800) Resp:  [6-23] 18 (08/25 0800) BP: (102-177)/(35-107) 136/65 mmHg (08/25 0800) SpO2:  [92 %-100 %] 96 % (08/25 0800)  General appearance: alert, cooperative and no distress  Head: Normocephalic, without obvious abnormality, atraumatic  Eyes: conjunctivae/corneas clear. PERRL, EOM's intact. Fundi benign.  Ears: normal TM's and external ear canals both ears  Nose: Nares normal. Septum midline. Mucosa normal. No drainage or sinus tenderness.  Throat: lips, mucosa, and tongue normal; teeth and gums normal  Neck: no adenopathy, no carotid bruit, no JVD, supple, symmetrical, trachea midline and thyroid not enlarged, symmetric, no tenderness/mass/nodules   Recent Labs  06/13/14 0250  WBC 8.7  HGB 15.1*  HCT 45.4  PLT 160    Recent Labs  06/13/14 0250  NA 140  K 4.2  CL 104  CO2 25  GLUCOSE 178*  BUN 10  CREATININE 0.50  CALCIUM 8.7    Medications:  I have reviewed the patient's current medications. Scheduled: . sodium chloride   Intravenous Once  . antiseptic oral rinse  7 mL Mouth Rinse BID  . enoxaparin (LOVENOX) injection  40 mg Subcutaneous Q24H  . pantoprazole  40 mg Oral BID  . sodium chloride  3 mL Intravenous Q12H  . sucralfate  1 g Oral 4 times per day   BJS:[id number] (ZOFRAN) IV, ondansetron  Assessment/Plan: Pt doing well s/p EGD by Dr. Collene Mares yesterday. Residual chicken bone was pushed into the stomach. An ulceration was noted at mid esophagus, likely the previous impaction site.  Pt will f/u with Dr. Collene Mares after discharge.  Will sign off.   LOS: 2 days   Edoardo Laforte,SUI W 06/14/2014, 8:43 AM

## 2014-06-15 MED ORDER — PANTOPRAZOLE SODIUM 40 MG PO TBEC: 40.0000 mg | DELAYED_RELEASE_TABLET | Freq: Two times a day (BID) | ORAL | Status: DC

## 2014-06-15 MED ORDER — SUCRALFATE 1 GM/10ML PO SUSP: 1.0000 g | Freq: Four times a day (QID) | ORAL | Status: DC

## 2014-06-15 NOTE — Progress Notes (Signed)
Pt/family given discharge instructions, medication lists, follow up appointments, and when to call the doctor.  Pt/family verbalizes understanding. Kennesha Brewbaker McClintock, RN   

## 2014-06-15 NOTE — Progress Notes (Signed)
      [address]       [place] [address]             [phone]       2 Days Post-Op Procedure(s) (LRB): ESOPHAGOGASTRODUODENOSCOPY (EGD) (Left)  Subjective: Patient eating breakfast without difficulties. Has moved bowels. Only complaint is a mild sore throat.  Objective: Vital signs in last 24 hours: Temp:  [97.6 F (36.4 C)-98.7 F (37.1 C)] 97.6 F (36.4 C) (08/26 0539) Pulse Rate:  [47-72] 50 (08/26 0539) Cardiac Rhythm:  [-] Sinus bradycardia;Normal sinus rhythm (08/25 2040) Resp:  [12-20] 20 (08/26 0539) BP: (109-140)/(45-63) 109/45 mmHg (08/26 0539) SpO2:  [93 %-98 %] 95 % (08/26 0539)     Intake/Output from previous day: 08/25 0701 - 08/26 0700 In: 630 [P.O.:480; I.V.:150] Out: 250 [Urine:250]   Physical Exam:  Cardiovascular: RRR, no murmurs, gallops, or rubs. Pulmonary: Clear to auscultation bilaterally; no rales, wheezes, or rhonchi. Abdomen: Soft, non tender, bowel sounds present.    Lab Results: CBC: Recent Labs  06/12/14 2145 06/13/14 0250  WBC  --  8.7  HGB 16.3* 15.1*  HCT 48.0* 45.4  PLT  --  160   BMET:  Recent Labs  06/12/14 2145 06/13/14 0250  NA 141 140  K 3.7 4.2  CL  --  104  CO2  --  25  GLUCOSE 115* 178*  BUN  --  10  CREATININE  --  0.50  CALCIUM  --  8.7    PT/INR: No results found for this basename: LABPROT, INR,  in the last 72 hours ABG:  INR: Will add last result for INR, ABG once components are confirmed Will add last 4 CBG results once components are confirmed  Assessment/Plan:  GI-Tolerating diet without problems. S/P EGD did show ulceration at mid esophagus, but residual chicken bone pushed into stomach.Will need to follow up with Dr. Collene Mares at discharge. Will discharge home.   ZIMMERMAN,DONIELLE MPA-C 06/15/2014,8:10 AM

## 2014-07-05 ENCOUNTER — Ambulatory Visit (INDEPENDENT_AMBULATORY_CARE_PROVIDER_SITE_OTHER): Payer: BC Managed Care – PPO | Admitting: Pulmonary Disease

## 2014-07-05 ENCOUNTER — Encounter: Payer: Self-pay | Admitting: Pulmonary Disease

## 2014-07-05 VITALS — BP 124/62 | HR 63 | Temp 98.0°F | Ht 61.0 in | Wt 172.0 lb

## 2014-07-05 DIAGNOSIS — G4734 Idiopathic sleep related nonobstructive alveolar hypoventilation: Secondary | ICD-10-CM | POA: Insufficient documentation

## 2014-07-05 NOTE — Patient Instructions (Signed)
Will schedule for a sleep study in November on a Friday night.  Will call once results are available.

## 2014-07-05 NOTE — Progress Notes (Signed)
   Subjective:    Patient ID: INETTA DICKE, female    DOB: 1956/01/05, 58 y.o.   MRN: [id number]  HPI The patient is a 58 year old female who I've been asked to see or nocturnal hypoxemia. She had a recent overnight oximetry which showed oxygen desaturation as low as 75%, and she spent 55 minutes less than 88%.  The patient has a long history of smoking, and has been told that she has COPD. However, I do not see any pulmonary function studies documented in the chart. She also has a history of underlying heart disease as well. She is unsure if she snores at night, but has been told that she has an abnormal breathing pattern during sleep. He denies any choking arousals. She awakens a few times a night, but feels that she is fairly rested upon arising. She notes some inappropriate daytime sleepiness with inactivity, but denies any issues with sleepiness in the evenings while trying to watch television. She also denies any sleepiness with driving. However, the patient is clearly sleepy during our visit today. She states that her weight is up 10-15 pounds over the last 2 years, and her Epworth score today is only 2.   Review of Systems  Constitutional: Negative for fever and unexpected weight change.  HENT: Positive for ear pain. Negative for congestion, dental problem, nosebleeds, postnasal drip, rhinorrhea, sinus pressure, sneezing, sore throat and trouble swallowing.   Eyes: Negative for redness and itching.  Respiratory: Positive for shortness of breath. Negative for cough, chest tightness and wheezing.   Cardiovascular: Negative for palpitations and leg swelling.  Gastrointestinal: Positive for abdominal pain. Negative for nausea and vomiting.  Genitourinary: Negative for dysuria.  Musculoskeletal: Negative for joint swelling.  Skin: Negative for rash.  Neurological: Negative for headaches.  Hematological: Does not bruise/bleed easily.  Psychiatric/Behavioral: Negative for dysphoric mood. The  patient is not nervous/anxious.        Objective:   Physical Exam Constitutional:  Overweight female, no acute distress  HENT:  Nares patent without discharge, mild septal deviation  Oropharynx without exudate, palate and uvula are moderately elongated.  Eyes:  Perrla, eomi, no scleral icterus  Neck:  No JVD, no TMG  Cardiovascular:  Normal rate, regular rhythm, no rubs or gallops.  No murmurs        Intact distal pulses  Pulmonary :  Normal breath sounds, no stridor or respiratory distress   No rales, rhonchi, or wheezing  Abdominal:  Soft, nondistended, bowel sounds present.  No tenderness noted.   Musculoskeletal:  minimal lower extremity edema noted.  Lymph Nodes:  No cervical lymphadenopathy noted  Skin:  No cyanosis noted  Neurologic:  Alert, appropriate, moves all 4 extremities without obvious deficit.         Assessment & Plan:

## 2014-07-05 NOTE — Assessment & Plan Note (Signed)
The patient has a nocturnal desaturation on a recent overnight oximetry, and it is unclear how much of this is related to possible sleep apnea, possibly COPD, and finally a possible contribution from her underlying cardiac issues. The patient is overweight and has mild centripetal obesity, but I suspect she is not heavy enough for hypoventilation but could certainly have hypoaeration during sleep.  I would like to do a sleep study to put the issue of sleep disordered breathing to rest, and if unremarkable, will evaluate from a pulmonary standpoint.

## 2014-07-07 ENCOUNTER — Ambulatory Visit (INDEPENDENT_AMBULATORY_CARE_PROVIDER_SITE_OTHER): Payer: BC Managed Care – PPO | Admitting: Internal Medicine

## 2014-07-07 ENCOUNTER — Other Ambulatory Visit (INDEPENDENT_AMBULATORY_CARE_PROVIDER_SITE_OTHER): Payer: BC Managed Care – PPO

## 2014-07-07 ENCOUNTER — Encounter: Payer: Self-pay | Admitting: Internal Medicine

## 2014-07-07 VITALS — BP 132/88 | HR 68 | Temp 97.9°F | Resp 16 | Ht 61.0 in | Wt 172.4 lb

## 2014-07-07 DIAGNOSIS — L299 Pruritus, unspecified: Secondary | ICD-10-CM

## 2014-07-07 LAB — COMPREHENSIVE METABOLIC PANEL
ALT: 37 U/L — AB (ref 0–35)
AST: 23 U/L (ref 0–37)
Albumin: 4.5 g/dL (ref 3.5–5.2)
Alkaline Phosphatase: 117 U/L (ref 39–117)
BILIRUBIN TOTAL: 0.8 mg/dL (ref 0.2–1.2)
BUN: 10 mg/dL (ref 6–23)
CO2: 33 mEq/L — ABNORMAL HIGH (ref 19–32)
CREATININE: 0.5 mg/dL (ref 0.4–1.2)
Calcium: 9.4 mg/dL (ref 8.4–10.5)
Chloride: 101 mEq/L (ref 96–112)
GFR: 162.63 mL/min (ref >60.00)
Glucose, Bld: 121 mg/dL — ABNORMAL HIGH (ref 70–99)
Potassium: 3.7 mEq/L (ref 3.5–5.1)
SODIUM: 139 meq/L (ref 135–145)
TOTAL PROTEIN: 8.2 g/dL (ref 6.0–8.3)

## 2014-07-07 MED ORDER — DIPHENHYDRAMINE HCL 25 MG PO TABS: 25.0000 mg | ORAL_TABLET | Freq: Four times a day (QID) | ORAL | Status: DC | PRN

## 2014-07-07 NOTE — Progress Notes (Signed)
Pre visit review using our clinic review tool, if applicable. No additional management support is needed unless otherwise documented below in the visit note. 

## 2014-07-07 NOTE — Patient Instructions (Signed)
We would like you to try benadryl for your itching and try not to scratch. You can take 1 pill of the benadryl every 6 hours for itching. Take 1 before bed to help yourself sleep.   Avoid using those dryer sheets with your clothes in case this cased the itching. We are going to check some blood work to check on your kidneys and liver.   Come back if you have more problems or a rash that develops.  Pruritus  Pruritus is an itch. There are many different problems that can cause an itch. Dry skin is one of the most common causes of itching. Most cases of itching do not require medical attention.  HOME CARE INSTRUCTIONS  Make sure your skin is moistened on a regular basis. A moisturizer that contains petroleum jelly is best for keeping moisture in your skin. If you develop a rash, you may try the following for relief:   Use corticosteroid cream.  Apply cool compresses to the affected areas.  Bathe with Epsom salts or baking soda in the bathwater.  Soak in colloidal oatmeal baths. These are available at your pharmacy.  Apply baking soda paste to the rash. Stir water into baking soda until it reaches a paste-like consistency.  Use an anti-itch lotion.  Take over-the-counter diphenhydramine medicine by mouth as the instructions direct.  Avoid scratching. Scratching may cause the rash to become infected. If itching is very bad, your caregiver may suggest prescription lotions or creams to lessen your symptoms.  Avoid hot showers, which can make itching worse. A cold shower may help with itching as long as you use a moisturizer after the shower. SEEK MEDICAL CARE IF: The itching does not go away after several days. Document Released: 06/19/2011 Document Revised: 02/21/2014 Document Reviewed: 06/19/2011 [facility] Patient Information 2015 [place], [place]. This information is not intended to replace advice given to you by your health care provider. Make sure you discuss any questions you have with  your health care provider.

## 2014-07-08 ENCOUNTER — Telehealth: Payer: Self-pay | Admitting: Internal Medicine

## 2014-07-08 DIAGNOSIS — L299 Pruritus, unspecified: Secondary | ICD-10-CM | POA: Insufficient documentation

## 2014-07-08 NOTE — Telephone Encounter (Signed)
Thinks someone called her in regards to lab results.  Please call back.

## 2014-07-08 NOTE — Telephone Encounter (Signed)
Amy called pt concerning labs. Forwarding msg to amy...Johny Chess

## 2014-07-08 NOTE — Assessment & Plan Note (Signed)
Unclear etiology and only new exposure is to dryer sheets. Advised her to avoid these. Sent in rx for benadryl to use prn for itching. Checking CMP to check on kidney and liver function. Recent CBC with Hg 16 which is not likely high enough to give pruritus. Advised to use cool clothes on her eyes to decrease itching and that she should avoid hot showers as these can make pruritus worse.

## 2014-07-08 NOTE — Progress Notes (Signed)
   Subjective:    Patient ID: [name], female    DOB: Feb 25, 1956, 58 y.o.   MRN: [id number]  HPI The patient is a 58 YO female who is coming in today for itching since yesterday. She has noticed that she is itching all over and is not sure what caused it. She has not had a rash or color change on her skin. No injuries. No outdoor exposure. She has not changed doses or medicines recently. She took a warm shower and this relaxed her skin. She denies fevers, chills, SOB, chest pain, nausea, vomiting, diarrhea. She did change dryer sheets recently but otherwise no new perfumes, deodorant, soaps. She has not had this problem before. She has not taken anything for the itching and has not tried any creams OTC. No new foods and she had chicken the night when itching started. She denies muscle fatigue or pain. She denies tiredness or weakness.  Review of Systems  Constitutional: Negative for fever, chills, activity change, appetite change and fatigue.  HENT: Negative.   Eyes: Negative.   Respiratory: Negative for cough, chest tightness, shortness of breath and wheezing.   Cardiovascular: Negative for chest pain, palpitations and leg swelling.  Gastrointestinal: Negative for nausea, vomiting, abdominal pain, diarrhea, constipation and abdominal distention.  Musculoskeletal: Negative for arthralgias and myalgias.  Skin: Negative for color change, rash and wound.  Allergic/Immunologic: Negative for food allergies.  Neurological: Negative for dizziness and weakness.  Psychiatric/Behavioral: Negative for agitation.      Objective:   Physical Exam  Vitals reviewed. Constitutional: She is oriented to person, place, and time. She appears well-developed and well-nourished. No distress.  HENT:  Head: Normocephalic and atraumatic.  Nose: Nose normal.  Mouth/Throat: Oropharynx is clear and moist. No oropharyngeal exudate.  Eyes: EOM are normal.  Neck: Normal range of motion.  Cardiovascular: Normal  rate and regular rhythm.   No murmur heard. Pulmonary/Chest: Effort normal and breath sounds normal. No respiratory distress. She has no wheezes. She has no rales. She exhibits no tenderness.  Abdominal: Soft. Bowel sounds are normal. She exhibits no distension. There is no tenderness. There is no rebound.  Musculoskeletal: She exhibits no edema and no tenderness.  Neurological: She is alert and oriented to person, place, and time. Coordination normal.  Skin: Skin is warm and dry. No rash noted. No erythema. No pallor.  She has itching mild all over her body but no rash and no skin breakdown from itching. Skin over the eyelids is red and she has been rubbing them throughout our visit.   Filed Vitals:   07/07/14 1355  BP: 132/88  Pulse: 68  Temp: 97.9 F (36.6 C)  TempSrc: Oral  Resp: 16  Height: 5\' 1"  (1.549 m)  Weight: 172 lb 6.4 oz (78.2 kg)  SpO2: 95%      Assessment & Plan:

## 2014-07-13 ENCOUNTER — Ambulatory Visit: Payer: BC Managed Care – PPO | Admitting: Internal Medicine

## 2014-07-19 ENCOUNTER — Other Ambulatory Visit: Payer: Self-pay | Admitting: Physician Assistant

## 2014-08-16 ENCOUNTER — Ambulatory Visit: Payer: BC Managed Care – PPO | Admitting: Internal Medicine

## 2014-08-16 ENCOUNTER — Encounter: Payer: Self-pay | Admitting: Internal Medicine

## 2014-08-16 ENCOUNTER — Ambulatory Visit (INDEPENDENT_AMBULATORY_CARE_PROVIDER_SITE_OTHER): Payer: BC Managed Care – PPO | Admitting: Internal Medicine

## 2014-08-16 VITALS — BP 114/78 | HR 58 | Temp 98.3°F | Wt 175.2 lb

## 2014-08-16 DIAGNOSIS — Z0000 Encounter for general adult medical examination without abnormal findings: Secondary | ICD-10-CM

## 2014-08-16 DIAGNOSIS — R911 Solitary pulmonary nodule: Secondary | ICD-10-CM

## 2014-08-16 DIAGNOSIS — R7302 Impaired glucose tolerance (oral): Secondary | ICD-10-CM

## 2014-08-16 DIAGNOSIS — D751 Secondary polycythemia: Secondary | ICD-10-CM

## 2014-08-16 NOTE — Progress Notes (Signed)
Subjective:    Patient ID: [name], female    DOB: 1956/09/25, 58 y.o.   MRN: [id number]  HPI  Here for wellness and f/u;  Overall doing ok;  Pt denies CP, worsening SOB, DOE, wheezing, orthopnea, PND, worsening LE edema, palpitations, dizziness or syncope.  Pt denies neurological change such as new headache, facial or extremity weakness.  Pt denies polydipsia, polyuria, or low sugar symptoms. Pt states overall good compliance with treatment and medications, good tolerability, and has been trying to follow lower cholesterol diet.  Pt denies worsening depressive symptoms, suicidal ideation or panic. No fever, night sweats, wt loss, loss of appetite, or other constitutional symptoms.  Pt states good ability with ADL's, has low fall risk, home safety reviewed and adequate, no other significant changes in hearing or vision, and only occasionally active with exercise.  No acute complaints Past Medical History  Diagnosis Date  . Coronary artery disease   . Bronchitis   . Sinusitis   . Hypertension   . Headache   . Heart disease   . Hyperlipidemia   . CAD (coronary artery disease) 09/03/2013  . CHF (congestive heart failure)   . COPD (chronic obstructive pulmonary disease)    Past Surgical History  Procedure Laterality Date  . Coronary artery bypass graft  2003    Triple bypass  . Rigid esophagoscopy N/A 06/12/2014    Procedure: RIGID ESOPHAGOSCOPY WITH FOREIGN BODY REMOVAL;  Surgeon: Ascencion Dike, MD;  Location: [facility] OR;  Service: ENT;  Laterality: N/A;  . Esophagogastroduodenoscopy Left 06/13/2014    Procedure: ESOPHAGOGASTRODUODENOSCOPY (EGD);  Surgeon: Juanita Craver, MD;  Location: [facility] ENDOSCOPY;  Service: Endoscopy;  Laterality: Left;    reports that she has been smoking Cigarettes.  She has a 20 pack-year smoking history. She does not have any smokeless tobacco history on file. She reports that she drinks alcohol. She reports that she does not use illicit drugs. family history includes  Brain cancer in her mother; Cancer in her maternal grandmother and another family member; Heart disease in her mother; Lung cancer in her maternal aunt and paternal uncle; Stroke in her other. No Known Allergies Current Outpatient Prescriptions on File Prior to Visit  Medication Sig Dispense Refill  . acetaminophen (TYLENOL) 500 MG tablet Take 1,000 mg by mouth at bedtime as needed for headache.      . albuterol (PROVENTIL HFA;VENTOLIN HFA) 108 (90 BASE) MCG/ACT inhaler Inhale 1-2 puffs into the lungs every 6 (six) hours as needed for wheezing or shortness of breath.  1 Inhaler  11  . amLODipine (NORVASC) 5 MG tablet Take 1 tablet (5 mg total) by mouth daily.  90 tablet  3  . aspirin EC 81 MG tablet Take 1 tablet (81 mg total) by mouth daily.  30 tablet  0  . atorvastatin (LIPITOR) 40 MG tablet Take 1 tablet (40 mg total) by mouth daily at 6 PM.  30 tablet  3  . carvedilol (COREG) 12.5 MG tablet Take 1 tablet (12.5 mg total) by mouth 2 (two) times daily.  60 tablet  5  . diphenhydrAMINE (BENADRYL) 25 MG tablet Take 1 tablet (25 mg total) by mouth every 6 (six) hours as needed.  30 tablet  0  . fluticasone (FLONASE) 50 MCG/ACT nasal spray Place 2 sprays into both nostrils daily.  16 g  5  . isosorbide mononitrate (IMDUR) 60 MG 24 hr tablet Take 1.5 tablets (90 mg total) by mouth daily.  45 tablet  11  .  pantoprazole (PROTONIX) 40 MG tablet Take 1 tablet (40 mg total) by mouth 2 (two) times daily.  60 tablet  1  . tiotropium (SPIRIVA HANDIHALER) 18 MCG inhalation capsule Place 1 capsule (18 mcg total) into inhaler and inhale daily.  30 capsule  12   No current facility-administered medications on file prior to visit.   Review of Systems Constitutional: Negative for increased diaphoresis, other activity, appetite or other siginficant weight change  HENT: Negative for worsening hearing loss, ear pain, facial swelling, mouth sores and neck stiffness.   Eyes: Negative for other worsening pain, redness  or visual disturbance.  Respiratory: Negative for shortness of breath and wheezing.   Cardiovascular: Negative for chest pain and palpitations.  Gastrointestinal: Negative for diarrhea, blood in stool, abdominal distention or other pain Genitourinary: Negative for hematuria, flank pain or change in urine volume.  Musculoskeletal: Negative for myalgias or other joint complaints.  Skin: Negative for color change and wound.  Neurological: Negative for syncope and numbness. other than noted Hematological: Negative for adenopathy. or other swelling Psychiatric/Behavioral: Negative for hallucinations, self-injury, decreased concentration or other worsening agitation.      Objective:   Physical Exam BP 114/78  Pulse 58  Temp(Src) 98.3 F (36.8 C) (Oral)  Wt 175 lb 4 oz (79.493 kg)  SpO2 97% VS noted,  Constitutional: Pt is oriented to person, place, and time. Appears well-developed and well-nourished.  Head: Normocephalic and atraumatic.  Right Ear: External ear normal.  Left Ear: External ear normal.  Nose: Nose normal.  Mouth/Throat: Oropharynx is clear and moist.  Eyes: Conjunctivae and EOM are normal. Pupils are equal, round, and reactive to light.  Neck: Normal range of motion. Neck supple. No JVD present. No tracheal deviation present.  Cardiovascular: Normal rate, regular rhythm, normal heart sounds and intact distal pulses.   Pulmonary/Chest: Effort normal and breath sounds without rales or wheezing  Abdominal: Soft. Bowel sounds are normal. NT. No HSM  Musculoskeletal: Normal range of motion. Exhibits no edema.  Lymphadenopathy:  Has no cervical adenopathy.  Neurological: Pt is alert and oriented to person, place, and time. Pt has normal reflexes. No cranial nerve deficit. Motor grossly intact Skin: Skin is warm and dry. No rash noted.  Psychiatric:  Has nervous mood and affect. Behavior is normal.     Assessment & Plan:

## 2014-08-16 NOTE — Progress Notes (Signed)
Pre visit review using our clinic review tool, if applicable. No additional management support is needed unless otherwise documented below in the visit note. 

## 2014-08-16 NOTE — Assessment & Plan Note (Addendum)

## 2014-08-16 NOTE — Patient Instructions (Signed)
Please continue all other medications as before, and refills have been done if requested.  Please have the pharmacy call with any other refills you may need.  Please continue your efforts at being more active, low cholesterol diet, and weight control.  You are otherwise up to date with prevention measures today.  Please keep your appointments with your specialists as you may have planned  We will plan to repeat the CT chest at next visit  Please return in 6 months, or sooner if needed

## 2014-08-18 ENCOUNTER — Ambulatory Visit: Payer: BC Managed Care – PPO | Admitting: Internal Medicine

## 2014-08-18 ENCOUNTER — Other Ambulatory Visit: Payer: Self-pay | Admitting: Physician Assistant

## 2014-08-20 ENCOUNTER — Encounter: Payer: Self-pay | Admitting: Internal Medicine

## 2014-08-20 DIAGNOSIS — R7302 Impaired glucose tolerance (oral): Secondary | ICD-10-CM

## 2014-08-20 DIAGNOSIS — E119 Type 2 diabetes mellitus without complications: Secondary | ICD-10-CM | POA: Insufficient documentation

## 2014-08-20 HISTORY — DX: Impaired glucose tolerance (oral): R73.02

## 2014-08-20 NOTE — Assessment & Plan Note (Signed)
Lab Results  Component Value Date   HGB 15.1* 06/13/2014   stable overall by history and exam, recent data reviewed with pt, and pt to continue medical treatment as before,  to f/u any worsening symptoms or concerns

## 2014-08-20 NOTE — Assessment & Plan Note (Signed)
For f/u CT chest next visit 6 mo

## 2014-08-20 NOTE — Assessment & Plan Note (Signed)
Mild, for contd diet and wt control,  Lab Results  Component Value Date   HGBA1C 5.8* 07/01/2013

## 2014-09-02 ENCOUNTER — Ambulatory Visit (HOSPITAL_BASED_OUTPATIENT_CLINIC_OR_DEPARTMENT_OTHER): Payer: BC Managed Care – PPO | Attending: Pulmonary Disease

## 2014-09-02 DIAGNOSIS — G4730 Sleep apnea, unspecified: Secondary | ICD-10-CM | POA: Diagnosis present

## 2014-09-02 DIAGNOSIS — G4734 Idiopathic sleep related nonobstructive alveolar hypoventilation: Secondary | ICD-10-CM

## 2014-09-02 DIAGNOSIS — G4733 Obstructive sleep apnea (adult) (pediatric): Secondary | ICD-10-CM

## 2014-09-02 DIAGNOSIS — G4710 Hypersomnia, unspecified: Secondary | ICD-10-CM | POA: Diagnosis present

## 2014-09-07 DIAGNOSIS — G4733 Obstructive sleep apnea (adult) (pediatric): Secondary | ICD-10-CM

## 2014-09-08 NOTE — Progress Notes (Signed)
Please let pt know that her sleep study does not show sleep apnea.  She does drop her oxygen level during sleep, but not very much and not for a long time.  I would like to schedule her for breathing studies to evaluate her lung function, and see her back the same day to review with her. If she is ok with this, please order full pfts with ov with me.

## 2014-09-08 NOTE — Sleep Study (Signed)
   NAME: [name] DATE OF BIRTH:  1956/03/16 MEDICAL RECORD NUMBER [id number]  LOCATION: [facility]  PHYSICIAN: Benjamin OF STUDY: 09/02/2014  SLEEP STUDY TYPE: Nocturnal Polysomnogram               REFERRING PHYSICIAN: Nayely Dingus, Armando Reichert, MD  INDICATION FOR STUDY: Hypersomnia with sleep apnea  EPWORTH SLEEPINESS SCORE:  2 HEIGHT:    WEIGHT:      There is no weight on file to calculate BMI.  NECK SIZE:   in.  MEDICATIONS: Reviewed in the sleep record  SLEEP ARCHITECTURE: The patient had a total sleep time of 361 minutes with adequate slow-wave sleep for age, and also adequate quantity of REM. Sleep onset latency was normal at 14 minutes, and REM onset was prolonged at 214 minutes. Sleep efficiency was mildly reduced at 88%.  RESPIRATORY DATA: The patient was found to have 4 apneas and 16 obstructive hypopneas, giving her an AHI of 3 of events per hour. The events were not positional, but did occur more frequently with REM. Loud snoring was noted throughout.  OXYGEN DATA: There was transient oxygen desaturation as low as 85% with the patient's obstructive events  CARDIAC DATA: Frequent PVCs were noted throughout.  MOVEMENT/PARASOMNIA: There were no significant limb movements or other abnormal behaviors noted.  IMPRESSION/ RECOMMENDATION:    1) small numbers of obstructive events which do not meet the AHI criteria for the obstructive sleep apnea syndrome. The patient did have loud snoring, and should work on aggressive weight loss.  2) frequent PVCs noted.     [place], American Board of Sleep Medicine  ELECTRONICALLY SIGNED ON:  09/08/2014, 1:09 PM [place] PH: [phone]   FX: [phone] [place]

## 2014-09-09 ENCOUNTER — Encounter: Payer: BC Managed Care – PPO | Admitting: Internal Medicine

## 2014-09-09 NOTE — Progress Notes (Signed)
ATC PT VM full WCB

## 2014-09-12 NOTE — Progress Notes (Signed)
lmomtcb for pt 

## 2014-09-12 NOTE — Progress Notes (Signed)
Spoke with pt regarding results. She could not schedule PFT and OV until Jan. This has been done. Nothing further needed

## 2014-09-13 ENCOUNTER — Encounter: Payer: Self-pay | Admitting: Internal Medicine

## 2014-09-13 ENCOUNTER — Other Ambulatory Visit: Payer: Self-pay | Admitting: Cardiology

## 2014-09-22 ENCOUNTER — Other Ambulatory Visit: Payer: Self-pay | Admitting: Internal Medicine

## 2014-09-22 ENCOUNTER — Other Ambulatory Visit: Payer: Self-pay | Admitting: Cardiology

## 2014-09-22 ENCOUNTER — Other Ambulatory Visit: Payer: Self-pay | Admitting: Physician Assistant

## 2014-09-23 ENCOUNTER — Other Ambulatory Visit: Payer: Self-pay | Admitting: Internal Medicine

## 2014-09-23 NOTE — Telephone Encounter (Signed)
Is this ok to refill for this patient? Please advise. Thanks, MI

## 2014-09-26 ENCOUNTER — Encounter: Payer: Self-pay | Admitting: Internal Medicine

## 2014-09-28 ENCOUNTER — Other Ambulatory Visit: Payer: Self-pay | Admitting: Cardiology

## 2014-09-30 ENCOUNTER — Other Ambulatory Visit: Payer: Self-pay

## 2014-09-30 MED ORDER — ISOSORBIDE MONONITRATE ER 60 MG PO TB24: 90.0000 mg | ORAL_TABLET | Freq: Every day | ORAL | Status: DC

## 2014-10-06 ENCOUNTER — Other Ambulatory Visit: Payer: Self-pay | Admitting: *Deleted

## 2014-10-06 MED ORDER — CARVEDILOL 12.5 MG PO TABS: 12.5000 mg | ORAL_TABLET | Freq: Two times a day (BID) | ORAL | Status: DC

## 2014-10-10 ENCOUNTER — Encounter: Payer: Self-pay | Admitting: Internal Medicine

## 2014-10-23 ENCOUNTER — Other Ambulatory Visit: Payer: Self-pay | Admitting: Cardiology

## 2014-10-24 ENCOUNTER — Ambulatory Visit: Payer: BC Managed Care – PPO | Admitting: Pulmonary Disease

## 2014-11-14 ENCOUNTER — Ambulatory Visit: Payer: BC Managed Care – PPO | Admitting: Cardiology

## 2014-11-19 ENCOUNTER — Other Ambulatory Visit: Payer: Self-pay | Admitting: Cardiology

## 2014-12-01 ENCOUNTER — Encounter: Payer: Self-pay | Admitting: Cardiology

## 2014-12-01 ENCOUNTER — Ambulatory Visit (INDEPENDENT_AMBULATORY_CARE_PROVIDER_SITE_OTHER): Payer: 59 | Admitting: Cardiology

## 2014-12-01 VITALS — BP 130/82 | HR 85 | Ht 61.0 in | Wt 176.1 lb

## 2014-12-01 DIAGNOSIS — Z951 Presence of aortocoronary bypass graft: Secondary | ICD-10-CM

## 2014-12-01 DIAGNOSIS — I10 Essential (primary) hypertension: Secondary | ICD-10-CM

## 2014-12-01 DIAGNOSIS — T18108S Unspecified foreign body in esophagus causing other injury, sequela: Secondary | ICD-10-CM

## 2014-12-01 NOTE — Progress Notes (Signed)
[facility]. [address] [place], [place]  [address] Phone: [phone] Fax:  [phone]  Date:  12/01/2014   ID:  Karem, Farha 1956-03-01, MRN [id number]  PCP:  Cathlean Cower, MD   History of Present Illness: KENT BRAUNSCHWEIG is a 59 y.o. female with coronary artery disease status post bypass surgery in 2003, LIMA to LAD, RIMA to RCA, SVG to OM with COPD, hypertension here for followup.  Last hospitalization in August 2015 was secondary to chicken bone impaction in midesophagus.  I saw her in the hospital on 07/02/13. She was having hypertensive emergency/headache. Angina was medically managed with both beta blocker and isosorbide as well as statin and aspirin. Troponin was negative. Tobacco cessation encouraged.  At prior visit, her blood pressure was seen to be quite elevated.  Wt Readings from Last 3 Encounters:  12/01/14 176 lb 1.9 oz (79.888 kg)  08/16/14 175 lb 4 oz (79.493 kg)  07/07/14 172 lb 6.4 oz (78.2 kg)     Past Medical History  Diagnosis Date  . Coronary artery disease   . Bronchitis   . Sinusitis   . Hypertension   . Headache   . Heart disease   . Hyperlipidemia   . CAD (coronary artery disease) 09/03/2013  . CHF (congestive heart failure)   . COPD (chronic obstructive pulmonary disease)   . Impaired glucose tolerance 08/20/2014    Past Surgical History  Procedure Laterality Date  . Coronary artery bypass graft  2003    Triple bypass  . Rigid esophagoscopy N/A 06/12/2014    Procedure: RIGID ESOPHAGOSCOPY WITH FOREIGN BODY REMOVAL;  Surgeon: Ascencion Dike, MD;  Location: [facility] OR;  Service: ENT;  Laterality: N/A;  . Esophagogastroduodenoscopy Left 06/13/2014    Procedure: ESOPHAGOGASTRODUODENOSCOPY (EGD);  Surgeon: Juanita Craver, MD;  Location: [facility] ENDOSCOPY;  Service: Endoscopy;  Laterality: Left;    Current Outpatient Prescriptions  Medication Sig Dispense Refill  . acetaminophen (TYLENOL) 500 MG tablet Take 1,000 mg by mouth at bedtime as  needed for headache.    . albuterol (PROVENTIL HFA;VENTOLIN HFA) 108 (90 BASE) MCG/ACT inhaler Inhale 1-2 puffs into the lungs every 6 (six) hours as needed for wheezing or shortness of breath. 1 Inhaler 11  . amLODipine (NORVASC) 5 MG tablet TAKE 1 TABLET (5 MG TOTAL) BY MOUTH DAILY. 30 tablet 2  . aspirin EC 81 MG tablet Take 1 tablet (81 mg total) by mouth daily. 30 tablet 0  . atorvastatin (LIPITOR) 40 MG tablet TAKE 1 TABLET (40 MG TOTAL) BY MOUTH DAILY AT 6 PM. 30 tablet 2  . carvedilol (COREG) 12.5 MG tablet Take 1 tablet (12.5 mg total) by mouth 2 (two) times daily. 60 tablet 2  . diphenhydrAMINE (BENADRYL) 25 MG tablet Take 1 tablet (25 mg total) by mouth every 6 (six) hours as needed. 30 tablet 0  . fluticasone (FLONASE) 50 MCG/ACT nasal spray PLACE 2 SPRAYS INTO BOTH NOSTRILS DAILY. 16 g 3  . isosorbide mononitrate (IMDUR) 60 MG 24 hr tablet TAKE 1.5 TABLETS (90 MG TOTAL) BY MOUTH DAILY. 45 tablet 0  . pantoprazole (PROTONIX) 40 MG tablet TAKE 1 TABLET BY MOUTH TWICE DAILY 180 tablet 3  . polyethylene glycol-electrolytes (NULYTELY/GOLYTELY) 420 G solution See admin instructions.  0  . tiotropium (SPIRIVA HANDIHALER) 18 MCG inhalation capsule Place 1 capsule (18 mcg total) into inhaler and inhale daily. 30 capsule 12   No current facility-administered medications for this visit.  Allergies:   No Known Allergies  Social History:  The patient  reports that she has quit smoking. Her smoking use included Cigarettes. She has a 20 pack-year smoking history. She does not have any smokeless tobacco history on file. She reports that she drinks alcohol. She reports that she does not use illicit drugs.   Family History  Problem Relation Age of Onset  . Lung cancer Paternal Uncle   . Heart disease Mother   . Cancer Maternal Grandmother     colon  . Stroke Other   . Brain cancer Mother   . Cancer      x 4 aunts  . Lung cancer Maternal Aunt     ROS:  Please see the history of present  illness.   No chest pain, no syncope, no palpitations  Right side of body feels puffy.  All other systems reviewed and negative.   PHYSICAL EXAM: VS:  BP 130/82 mmHg  Pulse 85  Ht 5\' 1"  (1.549 m)  Wt 176 lb 1.9 oz (79.888 kg)  BMI 33.29 kg/m2  SpO2 98% Well nourished, well developed, in no acute distress HEENT: normal, [place]/AT, EOMI Neck: no JVD, normal carotid upstroke, no bruit Cardiac:  normal S1, S2; RRR; no murmur Lungs:  clear to auscultation bilaterally, no wheezing, rhonchi or ralesChest scar noted Abd: soft, nontender, no hepatomegaly, no bruits Ext: no edema, 2+ distal pulses Skin: warm and dry GU: deferred Neuro: no focal abnormalities noted, AAO x 3  EKG:    Sinus bradycardia rate 56, occasional PAC, nonspecific ST-T wave changes   ASSESSMENT AND PLAN:  1. Bone impaction of esophagus-Dr. Cyndia Bent, Dr. Benjamine Mola. Chicken bone and cartilage removed from midportion of the esophagus. 06/12/14.  2. Hypertension-carvedilol to 12.5 mg twice a day. Her pulse is reasonable today. I do understand she has had some sinus bradycardia in the past.  Continue with other medications. 3. Coronary artery disease-stable, no exertional angina. Prior bypass.  Signed, Candee Furbish, MD [facility]  12/01/2014 4:38 PM

## 2014-12-01 NOTE — Patient Instructions (Signed)
The current medical regimen is effective;  continue present plan and medications.  Follow up in 1 year with Dr. Skains.  You will receive a letter in the mail 2 months before you are due.  Please call us when you receive this letter to schedule your follow up appointment.  Thank you for choosing [facility]!!     

## 2014-12-23 ENCOUNTER — Other Ambulatory Visit: Payer: Self-pay | Admitting: Cardiology

## 2015-01-12 ENCOUNTER — Other Ambulatory Visit: Payer: Self-pay | Admitting: Cardiology

## 2015-01-22 ENCOUNTER — Other Ambulatory Visit: Payer: Self-pay | Admitting: Cardiology

## 2015-02-08 ENCOUNTER — Encounter: Payer: Self-pay | Admitting: Internal Medicine

## 2015-02-15 ENCOUNTER — Ambulatory Visit: Payer: BC Managed Care – PPO | Admitting: Internal Medicine

## 2015-02-21 ENCOUNTER — Ambulatory Visit (INDEPENDENT_AMBULATORY_CARE_PROVIDER_SITE_OTHER): Payer: 59 | Admitting: Internal Medicine

## 2015-02-21 ENCOUNTER — Encounter: Payer: Self-pay | Admitting: Internal Medicine

## 2015-02-21 VITALS — BP 118/70 | HR 66 | Temp 97.5°F | Resp 18 | Ht 61.0 in | Wt 176.0 lb

## 2015-02-21 DIAGNOSIS — I10 Essential (primary) hypertension: Secondary | ICD-10-CM

## 2015-02-21 DIAGNOSIS — J449 Chronic obstructive pulmonary disease, unspecified: Secondary | ICD-10-CM | POA: Diagnosis not present

## 2015-02-21 DIAGNOSIS — Z0000 Encounter for general adult medical examination without abnormal findings: Secondary | ICD-10-CM

## 2015-02-21 DIAGNOSIS — Z0189 Encounter for other specified special examinations: Secondary | ICD-10-CM | POA: Diagnosis not present

## 2015-02-21 DIAGNOSIS — R7302 Impaired glucose tolerance (oral): Secondary | ICD-10-CM | POA: Diagnosis not present

## 2015-02-21 DIAGNOSIS — R600 Localized edema: Secondary | ICD-10-CM

## 2015-02-21 MED ORDER — HYDROCHLOROTHIAZIDE 12.5 MG PO TABS: 12.5000 mg | ORAL_TABLET | Freq: Every day | ORAL | Status: DC

## 2015-02-21 NOTE — Patient Instructions (Signed)
Please take all new medication as prescribed  - the fluid pill only if needed  Please continue all other medications as before, and refills have been done if requested.  Please have the pharmacy call with any other refills you may need.  Please continue your efforts at being more active, low cholesterol diet, and weight control.  Please keep your appointments with your specialists as you may have planned  Please return in 6 months, or sooner if needed, with Lab testing done 3-5 days before

## 2015-02-21 NOTE — Progress Notes (Signed)
Pre visit review using our clinic review tool, if applicable. No additional management support is needed unless otherwise documented below in the visit note. 

## 2015-02-21 NOTE — Progress Notes (Signed)
Subjective:    Patient ID: [name], female    DOB: 02-22-1956, 59 y.o.   MRN: [id number]  HPI  Here to f/u; overall doing ok,  Pt denies chest pain, increasing sob or doe, wheezing, orthopnea, PND, palpitations, dizziness or syncope, but does have small pedal edema in the last few months, better first in the AM, worse in the evening, nothing else seems to make better or worse..  Pt denies new neurological symptoms such as new headache, or facial or extremity weakness or numbness.  Pt denies polydipsia, polyuria, or low sugar episode.   Pt denies new neurological symptoms such as new headache, or facial or extremity weakness or numbness.   Pt states overall good compliance with meds, mostly trying to follow appropriate diet, with wt overall stable. Spiriva helps with COPD and exercise tolerance. Past Medical History  Diagnosis Date  . Coronary artery disease   . Bronchitis   . Sinusitis   . Hypertension   . Headache   . Heart disease   . Hyperlipidemia   . CAD (coronary artery disease) 09/03/2013  . CHF (congestive heart failure)   . COPD (chronic obstructive pulmonary disease)   . Impaired glucose tolerance 08/20/2014   Past Surgical History  Procedure Laterality Date  . Coronary artery bypass graft  2003    Triple bypass  . Rigid esophagoscopy N/A 06/12/2014    Procedure: RIGID ESOPHAGOSCOPY WITH FOREIGN BODY REMOVAL;  Surgeon: Ascencion Dike, MD;  Location: [facility] OR;  Service: ENT;  Laterality: N/A;  . Esophagogastroduodenoscopy Left 06/13/2014    Procedure: ESOPHAGOGASTRODUODENOSCOPY (EGD);  Surgeon: Juanita Craver, MD;  Location: [facility] ENDOSCOPY;  Service: Endoscopy;  Laterality: Left;    reports that she has quit smoking. Her smoking use included Cigarettes. She has a 20 pack-year smoking history. She does not have any smokeless tobacco history on file. She reports that she drinks alcohol. She reports that she does not use illicit drugs. family history includes Brain cancer in her  mother; Cancer in her maternal grandmother and another family member; Heart disease in her mother; Lung cancer in her maternal aunt and paternal uncle; Stroke in her other. No Known Allergies Current Outpatient Prescriptions on File Prior to Visit  Medication Sig Dispense Refill  . acetaminophen (TYLENOL) 500 MG tablet Take 1,000 mg by mouth at bedtime as needed for headache.    . albuterol (PROVENTIL HFA;VENTOLIN HFA) 108 (90 BASE) MCG/ACT inhaler Inhale 1-2 puffs into the lungs every 6 (six) hours as needed for wheezing or shortness of breath. 1 Inhaler 11  . amLODipine (NORVASC) 5 MG tablet TAKE 1 TABLET (5 MG TOTAL) BY MOUTH DAILY. 30 tablet 10  . aspirin EC 81 MG tablet Take 1 tablet (81 mg total) by mouth daily. 30 tablet 0  . atorvastatin (LIPITOR) 40 MG tablet TAKE 1 TABLET (40 MG TOTAL) BY MOUTH DAILY AT 6 PM. 30 tablet 2  . carvedilol (COREG) 12.5 MG tablet TAKE 1 TABLET (12.5 MG TOTAL) BY MOUTH 2 (TWO) TIMES DAILY. 60 tablet 11  . diphenhydrAMINE (BENADRYL) 25 MG tablet Take 1 tablet (25 mg total) by mouth every 6 (six) hours as needed. 30 tablet 0  . fluticasone (FLONASE) 50 MCG/ACT nasal spray PLACE 2 SPRAYS INTO BOTH NOSTRILS DAILY. 16 g 3  . isosorbide mononitrate (IMDUR) 60 MG 24 hr tablet TAKE 1.5 TABLETS (90 MG TOTAL) BY MOUTH DAILY. 45 tablet 10  . pantoprazole (PROTONIX) 40 MG tablet TAKE 1 TABLET BY MOUTH TWICE DAILY  180 tablet 3  . polyethylene glycol-electrolytes (NULYTELY/GOLYTELY) 420 G solution See admin instructions.  0  . tiotropium (SPIRIVA HANDIHALER) 18 MCG inhalation capsule Place 1 capsule (18 mcg total) into inhaler and inhale daily. 30 capsule 12   No current facility-administered medications on file prior to visit.   Review of Systems  Constitutional: Negative for unusual diaphoresis or night sweats HENT: Negative for ringing in ear or discharge Eyes: Negative for double vision or worsening visual disturbance.  Respiratory: Negative for choking and stridor.    Gastrointestinal: Negative for vomiting or other signifcant bowel change Genitourinary: Negative for hematuria or change in urine volume.  Musculoskeletal: Negative for other MSK pain or swelling Skin: Negative for color change and worsening wound.  Neurological: Negative for tremors and numbness other than noted  Psychiatric/Behavioral: Negative for decreased concentration or agitation other than above       Objective:   Physical Exam BP 118/70 mmHg  Pulse 66  Temp(Src) 97.5 F (36.4 C) (Oral)  Resp 18  Ht 5\' 1"  (1.549 m)  Wt 176 lb (79.833 kg)  BMI 33.27 kg/m2  SpO2 95% Wt Readings from Last 3 Encounters:  02/21/15 176 lb (79.833 kg)  12/01/14 176 lb 1.9 oz (79.888 kg)  08/16/14 175 lb 4 oz (79.493 kg)  VS noted,  Constitutional: Pt appears in no significant distress HENT: Head: NCAT.  Right Ear: External ear normal.  Left Ear: External ear normal.  Eyes: . Pupils are equal, round, and reactive to light. Conjunctivae and EOM are normal Neck: Normal range of motion. Neck supple.  Cardiovascular: Normal rate and regular rhythm.   Pulmonary/Chest: Effort normal and breath sounds without rales or wheezing.  Abd:  Soft, NT, ND, + BS Neurological: Pt is alert. Not confused , motor grossly intact Skin: Skin is warm. No rash, trace pedal edema Psychiatric: Pt behavior is normal. No agitation.      Assessment & Plan:

## 2015-02-22 DIAGNOSIS — R600 Localized edema: Secondary | ICD-10-CM | POA: Insufficient documentation

## 2015-02-22 NOTE — Assessment & Plan Note (Signed)
stable overall by history and exam, recent data reviewed with pt, and pt to continue medical treatment as before,  to f/u any worsening symptoms or concerns Lab Results  Component Value Date   HGBA1C 5.8* 07/01/2013

## 2015-02-22 NOTE — Assessment & Plan Note (Signed)
stable overall by history and exam, recent data reviewed with pt, and pt to continue medical treatment as before,  to f/u any worsening symptoms or concerns SpO2 Readings from Last 3 Encounters:  02/21/15 95%  12/01/14 98%  08/16/14 97%

## 2015-02-22 NOTE — Assessment & Plan Note (Signed)
stable overall by history and exam, recent data reviewed with pt, and pt to continue medical treatment as before,  to f/u any worsening symptoms or concerns BP Readings from Last 3 Encounters:  02/21/15 118/70  12/01/14 130/82  08/16/14 114/78

## 2015-02-22 NOTE — Assessment & Plan Note (Signed)
Trace, for hct 12.5 prn, leg elevation, low salt diet, wt loss

## 2015-02-26 ENCOUNTER — Other Ambulatory Visit: Payer: Self-pay | Admitting: Internal Medicine

## 2015-03-02 IMAGING — DX DG CHEST 2V
2 series · 2 of 2 positions shown · non-contrast
Comparison: 02/01/2014

CLINICAL DATA: Chest pain, back pain

EXAM:
CHEST  2 VIEW

[chest pa]
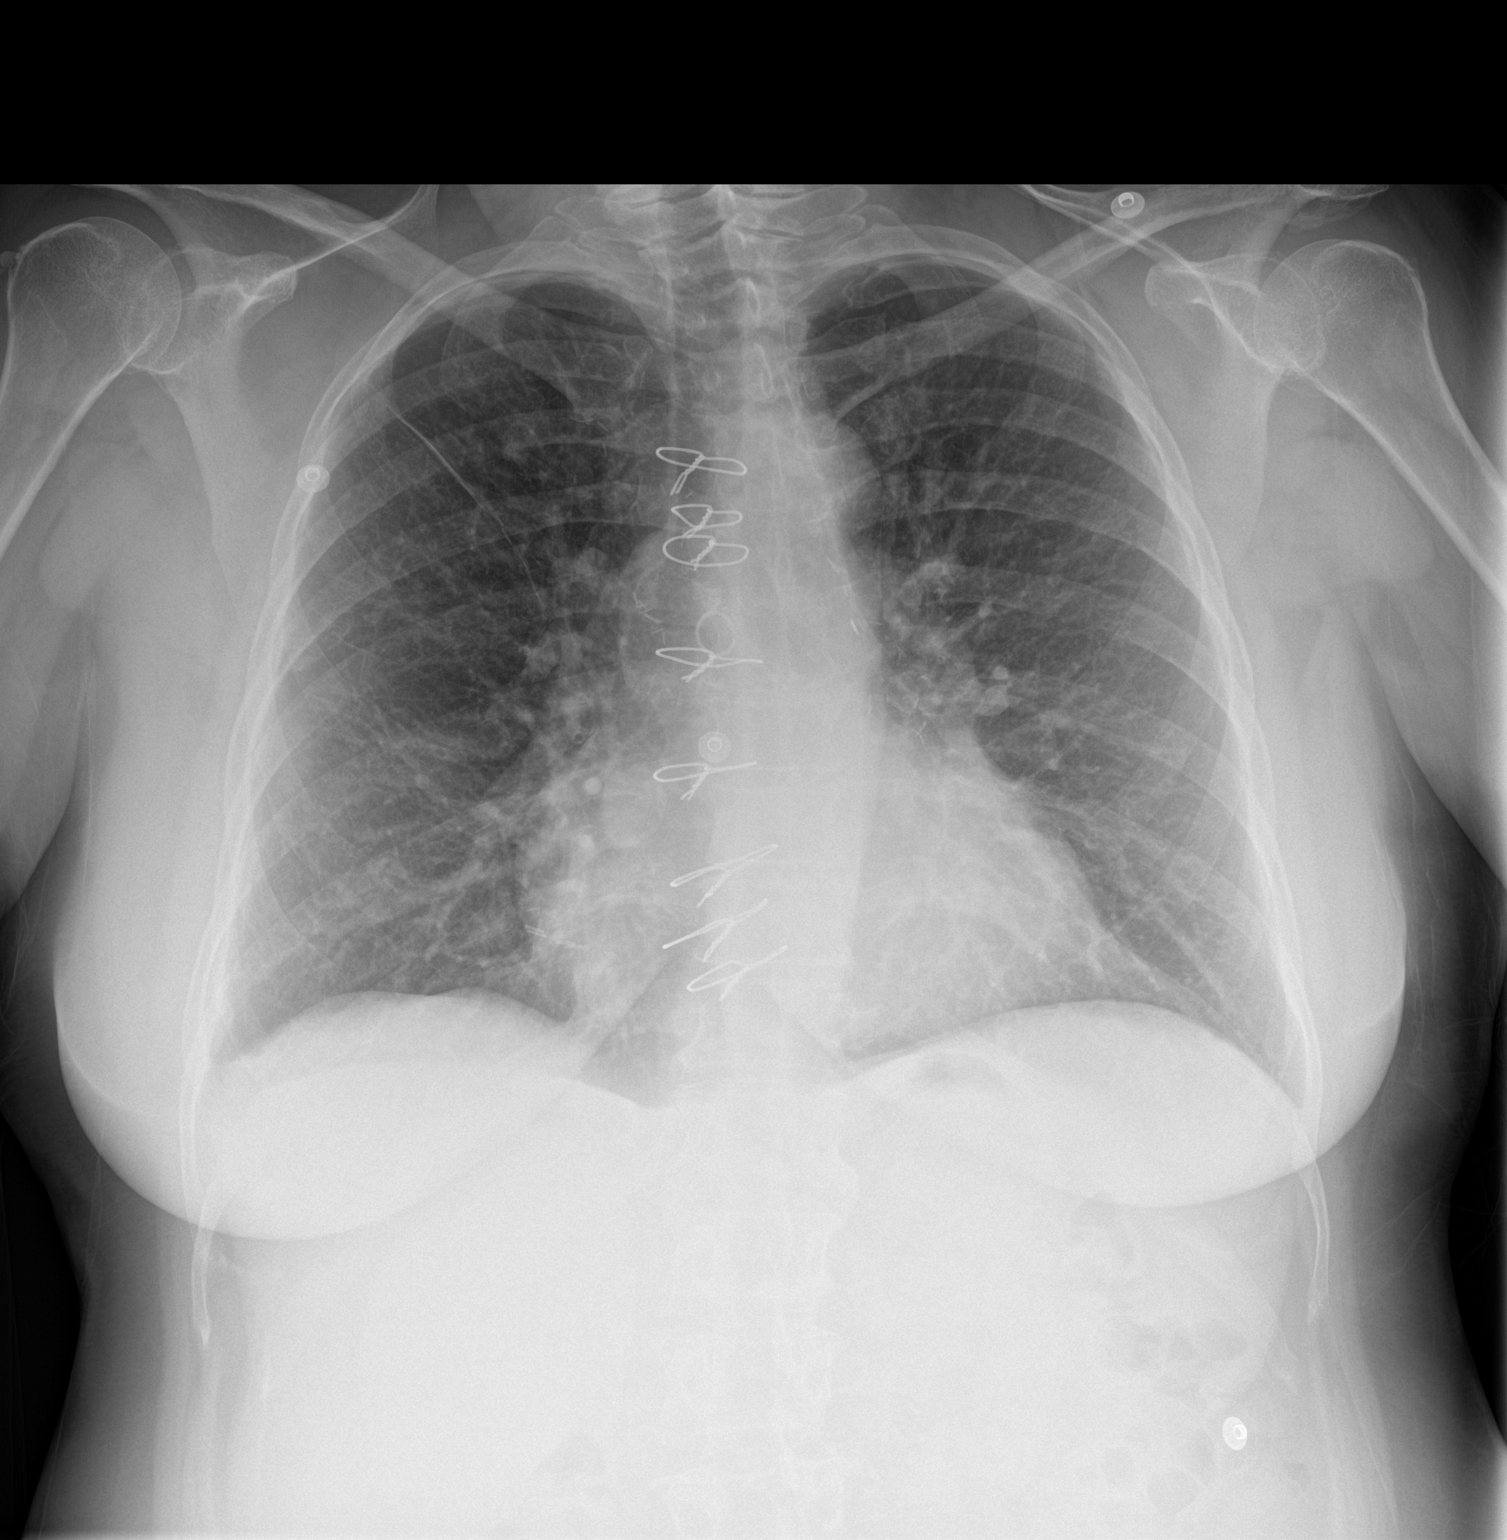

[chest lat]
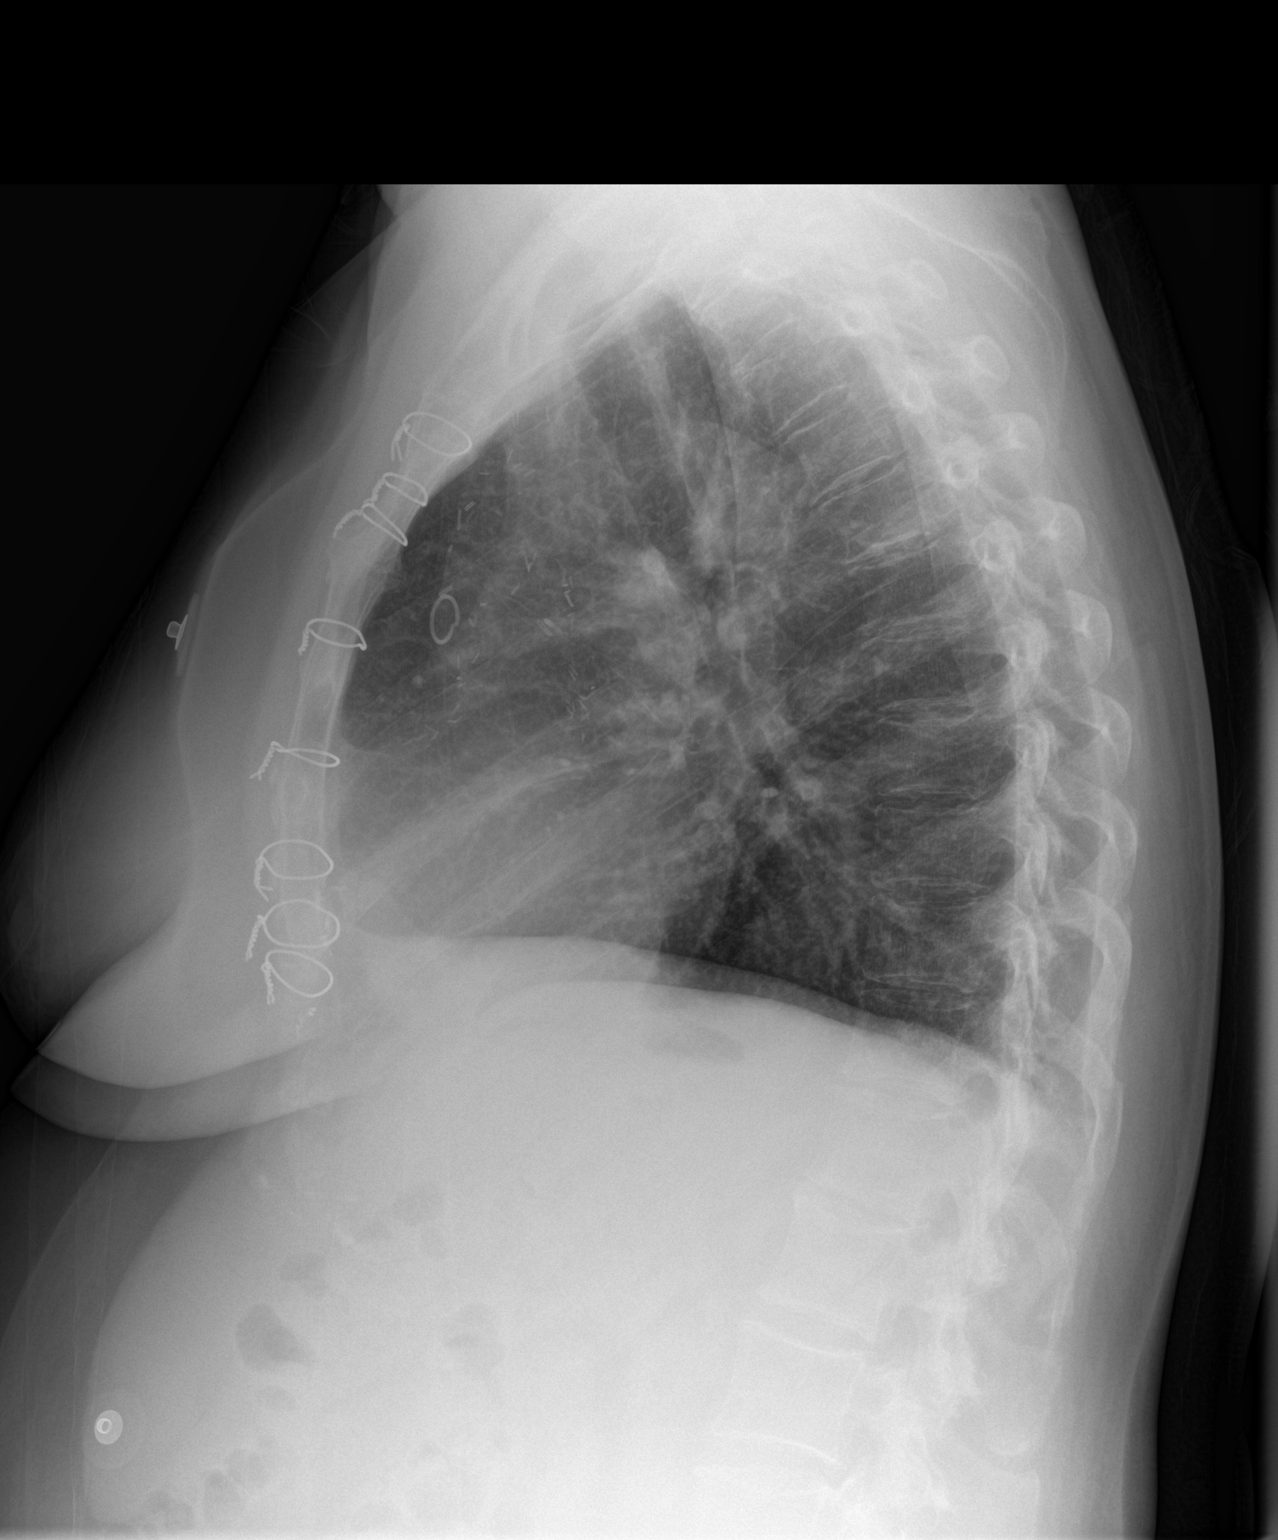

[2 of 2 positions shown; findings below may reference images not displayed]

FINDINGS: Cardiomediastinal silhouette is stable. Central mild vascular
congestion. No acute infiltrate or pulmonary edema. Status post
CABG. Stable linear scarring in right upper lobe.
IMPRESSION: Central vascular congestion without pulmonary edema. No focal
infiltrate. Status post CABG.

## 2015-03-21 ENCOUNTER — Telehealth: Payer: Self-pay | Admitting: Geriatric Medicine

## 2015-03-21 DIAGNOSIS — Z1231 Encounter for screening mammogram for malignant neoplasm of breast: Secondary | ICD-10-CM

## 2015-03-21 NOTE — Telephone Encounter (Signed)
Spoke with patient and she has not had a mammogram in a long time. She is due. I am putting in the orders and I informed patient that someone will be calling her with an appointment.

## 2015-05-10 ENCOUNTER — Other Ambulatory Visit: Payer: Self-pay | Admitting: Internal Medicine

## 2015-05-10 DIAGNOSIS — Z1231 Encounter for screening mammogram for malignant neoplasm of breast: Secondary | ICD-10-CM

## 2015-06-09 ENCOUNTER — Other Ambulatory Visit: Payer: Self-pay | Admitting: Obstetrics and Gynecology

## 2015-06-09 DIAGNOSIS — R928 Other abnormal and inconclusive findings on diagnostic imaging of breast: Secondary | ICD-10-CM

## 2015-06-15 ENCOUNTER — Ambulatory Visit
Admission: RE | Admit: 2015-06-15 | Discharge: 2015-06-15 | Disposition: A | Discharge status: Home / Self Care | Payer: 59 | Source: Ambulatory Visit | Attending: Obstetrics and Gynecology | Admitting: Obstetrics and Gynecology

## 2015-06-15 DIAGNOSIS — R928 Other abnormal and inconclusive findings on diagnostic imaging of breast: Secondary | ICD-10-CM

## 2015-07-31 ENCOUNTER — Encounter: Payer: Self-pay | Admitting: Internal Medicine

## 2015-07-31 ENCOUNTER — Ambulatory Visit (INDEPENDENT_AMBULATORY_CARE_PROVIDER_SITE_OTHER): Payer: 59 | Admitting: Internal Medicine

## 2015-07-31 VITALS — BP 130/76 | HR 70 | Temp 97.9°F | Resp 18 | Wt 174.0 lb

## 2015-07-31 DIAGNOSIS — J441 Chronic obstructive pulmonary disease with (acute) exacerbation: Secondary | ICD-10-CM | POA: Diagnosis not present

## 2015-07-31 DIAGNOSIS — Z23 Encounter for immunization: Secondary | ICD-10-CM

## 2015-07-31 DIAGNOSIS — J218 Acute bronchiolitis due to other specified organisms: Secondary | ICD-10-CM

## 2015-07-31 MED ORDER — LEVOFLOXACIN 500 MG PO TABS: 500.0000 mg | ORAL_TABLET | Freq: Every day | ORAL | Status: DC

## 2015-07-31 NOTE — Patient Instructions (Signed)
An antibiotic was sent to your pharmacy, take as prescribed.  If your symptoms do not improve or worsen please call or come in.   Continue your inhalers as prescribed.   Acute Bronchitis Bronchitis is inflammation of the airways that extend from the windpipe into the lungs (bronchi). The inflammation often causes mucus to develop. This leads to a cough, which is the most common symptom of bronchitis.  In acute bronchitis, the condition usually develops suddenly and goes away over time, usually in a couple weeks. Smoking, allergies, and asthma can make bronchitis worse. Repeated episodes of bronchitis may cause further lung problems.  CAUSES Acute bronchitis is most often caused by the same virus that causes a cold. The virus can spread from person to person (contagious) through coughing, sneezing, and touching contaminated objects. SIGNS AND SYMPTOMS   Cough.   Fever.   Coughing up mucus.   Body aches.   Chest congestion.   Chills.   Shortness of breath.   Sore throat.  DIAGNOSIS  Acute bronchitis is usually diagnosed through a physical exam. Your health care provider will also ask you questions about your medical history. Tests, such as chest X-rays, are sometimes done to rule out other conditions.  TREATMENT  Acute bronchitis usually goes away in a couple weeks. Oftentimes, no medical treatment is necessary. Medicines are sometimes given for relief of fever or cough. Antibiotic medicines are usually not needed but may be prescribed in certain situations. In some cases, an inhaler may be recommended to help reduce shortness of breath and control the cough. A cool mist vaporizer may also be used to help thin bronchial secretions and make it easier to clear the chest.  HOME CARE INSTRUCTIONS  Get plenty of rest.   Drink enough fluids to keep your urine clear or pale yellow (unless you have a medical condition that requires fluid restriction). Increasing fluids may help  thin your respiratory secretions (sputum) and reduce chest congestion, and it will prevent dehydration.   Take medicines only as directed by your health care provider.  If you were prescribed an antibiotic medicine, finish it all even if you start to feel better.  Avoid smoking and secondhand smoke. Exposure to cigarette smoke or irritating chemicals will make bronchitis worse. If you are a smoker, consider using nicotine gum or skin patches to help control withdrawal symptoms. Quitting smoking will help your lungs heal faster.   Reduce the chances of another bout of acute bronchitis by washing your hands frequently, avoiding people with cold symptoms, and trying not to touch your hands to your mouth, nose, or eyes.   Keep all follow-up visits as directed by your health care provider.  SEEK MEDICAL CARE IF: Your symptoms do not improve after 1 week of treatment.  SEEK IMMEDIATE MEDICAL CARE IF:  You develop an increased fever or chills.   You have chest pain.   You have severe shortness of breath.  You have bloody sputum.   You develop dehydration.  You faint or repeatedly feel like you are going to pass out.  You develop repeated vomiting.  You develop a severe headache. MAKE SURE YOU:   Understand these instructions.  Will watch your condition.  Will get help right away if you are not doing well or get worse.   This information is not intended to replace advice given to you by your health care provider. Make sure you discuss any questions you have with your health care provider.   Document Released:  11/14/2004 Document Revised: 10/28/2014 Document Reviewed: 03/30/2013 Elsevier Interactive Patient Education [employer].

## 2015-07-31 NOTE — Progress Notes (Signed)
Pre visit review using our clinic review tool, if applicable. No additional management support is needed unless otherwise documented below in the visit note. 

## 2015-07-31 NOTE — Progress Notes (Signed)
Subjective:    Patient ID: [name], female    DOB: 1956-08-21, 59 y.o.   MRN: [id number]  HPI  She is here for cold symptoms.  She has a history of COPD and has had copd exacerbations and acute bronchitis in the past.   Her symptoms started last Wednesday. Her cold started in her head and is now in her chest.  She has a productive cough with green, thick phlegm and has had some intermittent wheezing.  She denies any change in her chronic sob.  She has a sore throat, but denies ear pain, fever, sinus pain, headache and lightheadedness.  She has taken mucinex Dm and has used her albuterol inhaler.  The inhaler has helped.  She does take her spiriva daily.  She has been resting a lot.    Medications and allergies reviewed with patient and updated if appropriate.  Patient Active Problem List   Diagnosis Date Noted  . Pedal edema 02/22/2015  . Impaired glucose tolerance 08/20/2014  . Pruritus 07/08/2014  . Nocturnal hypoxemia 07/05/2014  . Esophagus, foreign body 06/12/2014  . Polycythemia, secondary 02/16/2014  . 5 mm Lung nodule, solitary 02/02/2014  . Acute respiratory failure with hypoxia ([place]) 02/01/2014  . GERD (gastroesophageal reflux disease) 02/01/2014  . Hypersomnolence 02/01/2014  . COPD with acute exacerbation ([place]) 02/01/2014  . Allergic rhinitis, cause unspecified 09/03/2013  . CAD (coronary artery disease) 09/03/2013  . Preventative health care 09/03/2013  . Essential hypertension 07/07/2013  . COPD (chronic obstructive pulmonary disease) with chronic bronchitis ([place]) 07/07/2013  . Status post coronary artery bypass grafting 07/07/2013  . Headache(784.0) 07/01/2013  . Hypertensive emergency 07/01/2013  . Polycythemia 07/01/2013    Past Medical History  Diagnosis Date  . Coronary artery disease   . Bronchitis   . Sinusitis   . Hypertension   . Headache   . Heart disease   . Hyperlipidemia   . CAD (coronary artery disease) 09/03/2013  . CHF  (congestive heart failure)   . COPD (chronic obstructive pulmonary disease)   . Impaired glucose tolerance 08/20/2014    Past Surgical History  Procedure Laterality Date  . Coronary artery bypass graft  2003    Triple bypass  . Rigid esophagoscopy N/A 06/12/2014    Procedure: RIGID ESOPHAGOSCOPY WITH FOREIGN BODY REMOVAL;  Surgeon: Ascencion Dike, MD;  Location: [facility] OR;  Service: ENT;  Laterality: N/A;  . Esophagogastroduodenoscopy Left 06/13/2014    Procedure: ESOPHAGOGASTRODUODENOSCOPY (EGD);  Surgeon: Juanita Craver, MD;  Location: [facility] ENDOSCOPY;  Service: Endoscopy;  Laterality: Left;    Social History   Social History  . Marital Status: Single    Spouse Name: N/A  . Number of Children: N/A  . Years of Education: N/A   Social History Main Topics  . Smoking status: Former Smoker -- 0.50 packs/day for 40 years    Types: Cigarettes  . Smokeless tobacco: Not on file  . Alcohol Use: 0.0 oz/week    0 Standard drinks or equivalent per week     Comment: occasional  . Drug Use: No  . Sexual Activity: Not Currently   Other Topics Concern  . Not on file   Social History Narrative    Review of Systems See HPI    Objective:   Filed Vitals:   07/31/15 1126  BP: 130/76  Pulse: 70  Temp: 97.9 F (36.6 C)  Resp: 18   Filed Weights   07/31/15 1126  Weight: 174 lb (78.926 kg)  Body mass index is 32.89 kg/(m^2).   Physical Exam  Constitutional: She appears well-developed and well-nourished. No distress.  HENT:  Head: Normocephalic and atraumatic.  Right Ear: External ear normal.  Left Ear: External ear normal.  Nose: Nose normal.  Mouth/Throat: Oropharynx is clear and moist.  Eyes: Conjunctivae are normal.  Neck: Neck supple. No tracheal deviation present. No thyromegaly present.  Cardiovascular: Normal rate, regular rhythm and normal heart sounds.   No murmur heard. Pulmonary/Chest: Effort normal. No respiratory distress. She has wheezes (mild expiratory wheeze).    Lymphadenopathy:    She has cervical adenopathy (tender on right side).  Skin: She is not diaphoretic.        Assessment & Plan:   Acute bronchitis, copd exacerbation, mild Acute bronchitis is likely bacterial and I will go ahead and start an antibiotic - levaquin Continue spiriva daily Use albuterol every 4-6 hours as needed No prednisone now - if she has increased wheeze or develops chest tightness/worsening of her sob she will call and I will prescribe a steroid taper Continue increased rest, fluids

## 2015-08-04 ENCOUNTER — Telehealth: Payer: Self-pay | Admitting: Internal Medicine

## 2015-08-04 MED ORDER — PREDNISONE 10 MG PO TABS
ORAL_TABLET | ORAL | Status: DC
Start: 2015-08-04 — End: 2015-08-29

## 2015-08-04 NOTE — Telephone Encounter (Signed)
Lets start a prednisone taper.   Sent to pharmacy.

## 2015-08-04 NOTE — Telephone Encounter (Signed)
Please advise 

## 2015-08-04 NOTE — Telephone Encounter (Signed)
Pt was in to see Dr. Quay Burow on Monday and to call back if her cold wasn't any better. She is still having chest congestion. Please advise

## 2015-08-04 NOTE — Telephone Encounter (Signed)
Called to inform pt.

## 2015-08-29 ENCOUNTER — Ambulatory Visit (INDEPENDENT_AMBULATORY_CARE_PROVIDER_SITE_OTHER): Payer: 59 | Admitting: Internal Medicine

## 2015-08-29 ENCOUNTER — Encounter: Payer: Self-pay | Admitting: Internal Medicine

## 2015-08-29 VITALS — BP 134/82 | HR 81 | Temp 98.9°F | Ht 61.0 in | Wt 169.0 lb

## 2015-08-29 DIAGNOSIS — R05 Cough: Secondary | ICD-10-CM | POA: Diagnosis not present

## 2015-08-29 DIAGNOSIS — R059 Cough, unspecified: Secondary | ICD-10-CM | POA: Insufficient documentation

## 2015-08-29 DIAGNOSIS — I10 Essential (primary) hypertension: Secondary | ICD-10-CM | POA: Diagnosis not present

## 2015-08-29 DIAGNOSIS — J441 Chronic obstructive pulmonary disease with (acute) exacerbation: Secondary | ICD-10-CM

## 2015-08-29 DIAGNOSIS — R7302 Impaired glucose tolerance (oral): Secondary | ICD-10-CM

## 2015-08-29 MED ORDER — METHYLPREDNISOLONE ACETATE 80 MG/ML IJ SUSP
80.0000 mg | Freq: Once | INTRAMUSCULAR | Status: AC
  Administered 2015-08-29: 80 mg via INTRAMUSCULAR

## 2015-08-29 MED ORDER — PREDNISONE 10 MG PO TABS: ORAL_TABLET | ORAL | Status: DC

## 2015-08-29 MED ORDER — ALBUTEROL SULFATE (2.5 MG/3ML) 0.083% IN NEBU
2.5000 mg | INHALATION_SOLUTION | Freq: Once | RESPIRATORY_TRACT | Status: AC
  Administered 2015-08-29: 2.5 mg via RESPIRATORY_TRACT

## 2015-08-29 MED ORDER — HYDROCODONE-HOMATROPINE 5-1.5 MG/5ML PO SYRP: 5.0000 mL | ORAL_SOLUTION | Freq: Four times a day (QID) | ORAL | Status: DC | PRN

## 2015-08-29 MED ORDER — AMOXICILLIN-POT CLAVULANATE 875-125 MG PO TABS: 1.0000 | ORAL_TABLET | Freq: Two times a day (BID) | ORAL | Status: DC

## 2015-08-29 NOTE — Assessment & Plan Note (Signed)
C/w flare acute bronchitis, cant r/o pna , I wonder about even abscess given particularly bad tasting sputum, for CXR, consider CT

## 2015-08-29 NOTE — Patient Instructions (Addendum)
You had the steroid shot today, and the Albuterol nebulizer treatment today  Please take all new medication as prescribed - the antibiotic, cough medicine, and prednisone  Please continue all other medications as before, including both of your inhalers  Please have the pharmacy call with any other refills you may need.  Please continue your efforts at being more active, low cholesterol diet, and weight control.  Please keep your appointments with your specialists as you may have planned  Please go to the XRAY Department in the Basement (go straight as you get off the elevator) for the x-ray testing  You will be contacted by phone if any changes need to be made immediately.  Otherwise, you will receive a letter about your results with an explanation, but please check with MyChart first.  Please remember to sign up for MyChart if you have not done so, as this will be important to you in the future with finding out test results, communicating by private email, and scheduling acute appointments online when needed.

## 2015-08-29 NOTE — Assessment & Plan Note (Signed)
Mild to mod, for albut neb tx, depomedrol IM, predpac asd,  to f/u any worsening symptoms or concerns

## 2015-08-29 NOTE — Progress Notes (Signed)
Pre visit review using our clinic review tool, if applicable. No additional management support is needed unless otherwise documented below in the visit note. 

## 2015-08-29 NOTE — Assessment & Plan Note (Signed)
stable overall by history and exam, recent data reviewed with pt, and pt to continue medical treatment as before,  to f/u any worsening symptoms or concerns Lab Results  Component Value Date   HGBA1C 5.8* 07/01/2013    

## 2015-08-29 NOTE — Assessment & Plan Note (Signed)
stable overall by history and exam, recent data reviewed with pt, and pt to continue medical treatment as before,  to f/u any worsening symptoms or concerns BP Readings from Last 3 Encounters:  08/29/15 134/82  07/31/15 130/76  02/21/15 118/70

## 2015-08-29 NOTE — Progress Notes (Signed)
Subjective:    Patient ID: [name], female    DOB: January 27, 1956, 59 y.o.   MRN: [id number]  HPI  Here with acute onset mild to mod 2-3 wks ST, HA, general weakness and malaise, with prod cough greenish sputum bad tasting, but Pt denies chest pain, orthopnea, PND, increased LE swelling, palpitations, dizziness or syncope, but does have increased wheezing, sob/doe last few days. States never really improved from tx with antibx/steroid last visit.  Pt denies new neurological symptoms such as new headache, or facial or extremity weakness or numbness   Pt denies polydipsia, polyuria.  Past Medical History  Diagnosis Date  . Coronary artery disease   . Bronchitis   . Sinusitis   . Hypertension   . Headache   . Heart disease   . Hyperlipidemia   . CAD (coronary artery disease) 09/03/2013  . CHF (congestive heart failure) ([place])   . COPD (chronic obstructive pulmonary disease) ([place])   . Impaired glucose tolerance 08/20/2014   Past Surgical History  Procedure Laterality Date  . Coronary artery bypass graft  2003    Triple bypass  . Rigid esophagoscopy N/A 06/12/2014    Procedure: RIGID ESOPHAGOSCOPY WITH FOREIGN BODY REMOVAL;  Surgeon: Ascencion Dike, MD;  Location: [facility] OR;  Service: ENT;  Laterality: N/A;  . Esophagogastroduodenoscopy Left 06/13/2014    Procedure: ESOPHAGOGASTRODUODENOSCOPY (EGD);  Surgeon: Juanita Craver, MD;  Location: [facility] ENDOSCOPY;  Service: Endoscopy;  Laterality: Left;    reports that she has quit smoking. Her smoking use included Cigarettes. She has a 20 pack-year smoking history. She does not have any smokeless tobacco history on file. She reports that she drinks alcohol. She reports that she does not use illicit drugs. family history includes Brain cancer in her mother; Cancer in her maternal grandmother and another family member; Heart disease in her mother; Lung cancer in her maternal aunt and paternal uncle; Stroke in her other. No Known Allergies Current Outpatient  Prescriptions on File Prior to Visit  Medication Sig Dispense Refill  . acetaminophen (TYLENOL) 500 MG tablet Take 1,000 mg by mouth at bedtime as needed for headache.    . albuterol (PROVENTIL HFA;VENTOLIN HFA) 108 (90 BASE) MCG/ACT inhaler Inhale 1-2 puffs into the lungs every 6 (six) hours as needed for wheezing or shortness of breath. 1 Inhaler 11  . amLODipine (NORVASC) 5 MG tablet TAKE 1 TABLET (5 MG TOTAL) BY MOUTH DAILY. 30 tablet 10  . aspirin EC 81 MG tablet Take 1 tablet (81 mg total) by mouth daily. 30 tablet 0  . atorvastatin (LIPITOR) 40 MG tablet TAKE 1 TABLET (40 MG TOTAL) BY MOUTH DAILY AT 6 PM. 30 tablet 2  . carvedilol (COREG) 12.5 MG tablet TAKE 1 TABLET (12.5 MG TOTAL) BY MOUTH 2 (TWO) TIMES DAILY. 60 tablet 11  . diphenhydrAMINE (BENADRYL) 25 MG tablet Take 1 tablet (25 mg total) by mouth every 6 (six) hours as needed. 30 tablet 0  . fluticasone (FLONASE) 50 MCG/ACT nasal spray PLACE 2 SPRAYS INTO BOTH NOSTRILS DAILY. 16 g 3  . hydrochlorothiazide (HYDRODIURIL) 12.5 MG tablet Take 1 tablet (12.5 mg total) by mouth daily. (Patient taking differently: Take 12.5 mg by mouth daily as needed. ) 90 tablet 3  . isosorbide mononitrate (IMDUR) 60 MG 24 hr tablet TAKE 1.5 TABLETS (90 MG TOTAL) BY MOUTH DAILY. 45 tablet 10  . SPIRIVA HANDIHALER 18 MCG inhalation capsule PLACE 1 CAPSULE (18 MCG TOTAL) INTO INHALER AND INHALE DAILY. 30 capsule 11  No current facility-administered medications on file prior to visit.     Review of Systems  Constitutional: Negative for unusual diaphoresis or night sweats HENT: Negative for ringing in ear or discharge Eyes: Negative for double vision or worsening visual disturbance.  Respiratory: Negative for choking and stridor.   Gastrointestinal: Negative for vomiting or other signifcant bowel change Genitourinary: Negative for hematuria or change in urine volume.  Musculoskeletal: Negative for other MSK pain or swelling Skin: Negative for color  change and worsening wound.  Neurological: Negative for tremors and numbness other than noted  Psychiatric/Behavioral: Negative for decreased concentration or agitation other than above       Objective:   Physical Exam BP 134/82 mmHg  Pulse 81  Temp(Src) 98.9 F (37.2 C) (Oral)  Ht 5\' 1"  (1.549 m)  Wt 169 lb (76.658 kg)  BMI 31.95 kg/m2  SpO2 90% VS noted,  Constitutional: Pt appears in no significant distress HENT: Head: NCAT.  Right Ear: External ear normal.  Left Ear: External ear normal.  Eyes: . Pupils are equal, round, and reactive to light. Conjunctivae and EOM are normal Neck: Normal range of motion. Neck supple.  Cardiovascular: Normal rate and regular rhythm.   Pulmonary/Chest: Effort normal and breath sounds markedly decreased without rales but +mild insp wheezing.  Neurological: Pt is alert. Not confused , motor grossly intact Skin: Skin is warm. No rash, no LE edema Psychiatric: Pt behavior is normal. No agitation.         Assessment & Plan:

## 2015-08-30 ENCOUNTER — Ambulatory Visit (INDEPENDENT_AMBULATORY_CARE_PROVIDER_SITE_OTHER)
Admission: RE | Admit: 2015-08-30 | Discharge: 2015-08-30 | Disposition: A | Discharge status: Home / Self Care | Payer: 59 | Source: Ambulatory Visit | Attending: Internal Medicine | Admitting: Internal Medicine

## 2015-08-30 DIAGNOSIS — J441 Chronic obstructive pulmonary disease with (acute) exacerbation: Secondary | ICD-10-CM | POA: Diagnosis not present

## 2015-08-30 DIAGNOSIS — R05 Cough: Secondary | ICD-10-CM | POA: Diagnosis not present

## 2015-10-05 ENCOUNTER — Encounter: Payer: Self-pay | Admitting: Cardiology

## 2015-10-05 ENCOUNTER — Other Ambulatory Visit: Payer: Self-pay | Admitting: Internal Medicine

## 2015-10-19 ENCOUNTER — Ambulatory Visit (INDEPENDENT_AMBULATORY_CARE_PROVIDER_SITE_OTHER): Payer: 59 | Admitting: Internal Medicine

## 2015-10-19 ENCOUNTER — Other Ambulatory Visit (INDEPENDENT_AMBULATORY_CARE_PROVIDER_SITE_OTHER): Payer: 59

## 2015-10-19 ENCOUNTER — Encounter: Payer: Self-pay | Admitting: Internal Medicine

## 2015-10-19 VITALS — BP 102/60 | HR 80 | Temp 98.5°F | Ht 61.0 in | Wt 168.0 lb

## 2015-10-19 DIAGNOSIS — R739 Hyperglycemia, unspecified: Secondary | ICD-10-CM

## 2015-10-19 DIAGNOSIS — Z0000 Encounter for general adult medical examination without abnormal findings: Secondary | ICD-10-CM

## 2015-10-19 DIAGNOSIS — R7302 Impaired glucose tolerance (oral): Secondary | ICD-10-CM | POA: Diagnosis not present

## 2015-10-19 DIAGNOSIS — I10 Essential (primary) hypertension: Secondary | ICD-10-CM

## 2015-10-19 DIAGNOSIS — Z1231 Encounter for screening mammogram for malignant neoplasm of breast: Secondary | ICD-10-CM

## 2015-10-19 DIAGNOSIS — R7989 Other specified abnormal findings of blood chemistry: Secondary | ICD-10-CM | POA: Diagnosis not present

## 2015-10-19 DIAGNOSIS — M5416 Radiculopathy, lumbar region: Secondary | ICD-10-CM | POA: Insufficient documentation

## 2015-10-19 LAB — URINALYSIS, ROUTINE W REFLEX MICROSCOPIC
Bilirubin Urine: NEGATIVE
HGB URINE DIPSTICK: NEGATIVE
Ketones, ur: NEGATIVE
Leukocytes, UA: NEGATIVE
NITRITE: NEGATIVE
RBC / HPF: NONE SEEN (ref 0–?)
Specific Gravity, Urine: 1.025 (ref 1.000–1.030)
Total Protein, Urine: NEGATIVE
URINE GLUCOSE: NEGATIVE
Urobilinogen, UA: 0.2 (ref 0.0–1.0)
pH: 5.5 (ref 5.0–8.0)

## 2015-10-19 LAB — CBC WITH DIFFERENTIAL/PLATELET
BASOS PCT: 0.9 % (ref 0.0–3.0)
Basophils Absolute: 0.1 10*3/uL (ref 0.0–0.1)
EOS PCT: 2.9 % (ref 0.0–5.0)
Eosinophils Absolute: 0.3 10*3/uL (ref 0.0–0.7)
HEMATOCRIT: 51.4 % — AB (ref 36.0–46.0)
HEMOGLOBIN: 16.9 g/dL — AB (ref 12.0–15.0)
LYMPHS PCT: 30.1 % (ref 12.0–46.0)
Lymphs Abs: 2.6 10*3/uL (ref 0.7–4.0)
MCHC: 32.9 g/dL (ref 30.0–36.0)
MCV: 87.1 fl (ref 78.0–100.0)
Monocytes Absolute: 0.5 10*3/uL (ref 0.1–1.0)
Monocytes Relative: 6 % (ref 3.0–12.0)
NEUTROS ABS: 5.2 10*3/uL (ref 1.4–7.7)
Neutrophils Relative %: 60.1 % (ref 43.0–77.0)
Platelets: 198 10*3/uL (ref 150.0–400.0)
RBC: 5.9 Mil/uL — ABNORMAL HIGH (ref 3.87–5.11)
RDW: 16 % — AB (ref 11.5–15.5)
WBC: 8.6 10*3/uL (ref 4.0–10.5)

## 2015-10-19 LAB — LIPID PANEL
Cholesterol: 239 mg/dL — ABNORMAL HIGH (ref 0–200)
HDL: 43.2 mg/dL (ref >39.00)
NonHDL: 195.93
TRIGLYCERIDES: 270 mg/dL — AB (ref 0.0–149.0)
Total CHOL/HDL Ratio: 6
VLDL: 54 mg/dL — AB (ref 0.0–40.0)

## 2015-10-19 LAB — HEPATIC FUNCTION PANEL
ALT: 36 U/L — ABNORMAL HIGH (ref 0–35)
AST: 23 U/L (ref 0–37)
Albumin: 4.3 g/dL (ref 3.5–5.2)
Alkaline Phosphatase: 92 U/L (ref 39–117)
BILIRUBIN DIRECT: 0.1 mg/dL (ref 0.0–0.3)
Total Bilirubin: 0.6 mg/dL (ref 0.2–1.2)
Total Protein: 7.3 g/dL (ref 6.0–8.3)

## 2015-10-19 LAB — BASIC METABOLIC PANEL
BUN: 14 mg/dL (ref 6–23)
CALCIUM: 9.5 mg/dL (ref 8.4–10.5)
CO2: 32 meq/L (ref 19–32)
CREATININE: 0.62 mg/dL (ref 0.40–1.20)
Chloride: 104 mEq/L (ref 96–112)
GFR: 126.32 mL/min (ref >60.00)
GLUCOSE: 78 mg/dL (ref 70–99)
Potassium: 3.9 mEq/L (ref 3.5–5.1)
Sodium: 143 mEq/L (ref 135–145)

## 2015-10-19 LAB — HEMOGLOBIN A1C: HEMOGLOBIN A1C: 6.3 % (ref 4.6–6.5)

## 2015-10-19 LAB — LDL CHOLESTEROL, DIRECT: LDL DIRECT: 153 mg/dL

## 2015-10-19 LAB — TSH: TSH: 1.54 u[IU]/mL (ref 0.35–4.50)

## 2015-10-19 MED ORDER — CYCLOBENZAPRINE HCL 5 MG PO TABS
5.0000 mg | ORAL_TABLET | Freq: Three times a day (TID) | ORAL | Status: DC | PRN
Start: 2015-10-19 — End: 2017-10-02

## 2015-10-19 MED ORDER — HYDROCODONE-ACETAMINOPHEN 10-325 MG PO TABS: 1.0000 | ORAL_TABLET | Freq: Four times a day (QID) | ORAL | Status: DC | PRN

## 2015-10-19 MED ORDER — PREDNISONE 10 MG PO TABS: ORAL_TABLET | ORAL | Status: DC

## 2015-10-19 NOTE — Assessment & Plan Note (Signed)
BP Readings from Last 3 Encounters:  10/19/15 102/60  08/29/15 134/82  07/31/15 130/76   stable overall by history and exam, recent data reviewed with pt, and pt to continue medical treatment as before,  to f/u any worsening symptoms or concerns

## 2015-10-19 NOTE — Patient Instructions (Signed)
Please take all new medication as prescribed - the pain medication, muscle relaxer as needed, prednisone  You will be contacted regarding the referral for: MRI Lower back, and orthopedic  Please continue all other medications as before, and refills have been done if requested.  Please have the pharmacy call with any other refills you may need.  Please continue your efforts at being more active, low cholesterol diet, and weight control.  You are otherwise up to date with prevention measures today.  Please keep your appointments with your specialists as you may have planned  Please go to the LAB in the Basement (turn left off the elevator) for the tests to be done today  You will be contacted by phone if any changes need to be made immediately.  Otherwise, you will receive a letter about your results with an explanation, but please check with MyChart first.  Please remember to sign up for MyChart if you have not done so, as this will be important to you in the future with finding out test results, communicating by private email, and scheduling acute appointments online when needed.  Please return in 6 months, or sooner if needed

## 2015-10-19 NOTE — Progress Notes (Signed)
Pre visit review using our clinic review tool, if applicable. No additional management support is needed unless otherwise documented below in the visit note. 

## 2015-10-19 NOTE — Assessment & Plan Note (Signed)
stable overall by history and exam, recent data reviewed with pt, and pt to continue medical treatment as before,  to f/u any worsening symptoms or concerns Lab Results  Component Value Date   HGBA1C 5.8* 07/01/2013   For f/u lab

## 2015-10-19 NOTE — Assessment & Plan Note (Signed)

## 2015-10-19 NOTE — Progress Notes (Signed)
Subjective:    Patient ID: [name], female    DOB: [birth year], 59 y.o.   MRN: [id number]  HPI  Here for wellness and f/u;  Overall doing ok;  Pt denies Chest pain, worsening SOB, DOE, wheezing, orthopnea, PND, worsening LE edema, palpitations, dizziness or syncope.  Pt denies neurological change such as new headache, facial or extremity weakness.  Pt denies polydipsia, polyuria, or low sugar symptoms. Pt states overall good compliance with treatment and medications, good tolerability, and has been trying to follow appropriate diet.  Pt denies worsening depressive symptoms, suicidal ideation or panic. No fever, night sweats, wt loss, loss of appetite, or other constitutional symptoms.  Pt states good ability with ADL's, has low fall risk, home safety reviewed and adequate, no other significant changes in hearing or vision, and only occasionally active with exercise.  Also with acute onset LBP and left leg pain, noticed mod to severe sudden onset with trying to get up from the commode 3 days after toileting first thing in the am; was able to call sister who lives close by and helped her to sit on the bed, but pain actually too much to try to lie down, pain 10/10, sister called EMS, who tried to flex her hip, seemed to help somewhat with the lower back pain/pressure, and she was able to lie down; further efforts to flex the hip with lying seemed to help, later able to get OOB back to the BR; since all this happened still has some recurring pain to the left LB with a sort of catch sometimes and tries to give away but no falls as she keeps hold of something around her. Still with occas radiation to the left buttock and then often down the leg past the knee. Pain still about 6/10 intermittent.  In retrospect has had minor pain similar fleeting to the lower back.  Past Medical History  Diagnosis Date  . Coronary artery disease   . Bronchitis   . Sinusitis   . Hypertension   . Headache   . Heart  disease   . Hyperlipidemia   . CAD (coronary artery disease) 09/03/2013  . CHF (congestive heart failure) ([place])   . COPD (chronic obstructive pulmonary disease) ([place])   . Impaired glucose tolerance 08/20/2014   Past Surgical History  Procedure Laterality Date  . Coronary artery bypass graft  2003    Triple bypass  . Rigid esophagoscopy N/A 06/12/2014    Procedure: RIGID ESOPHAGOSCOPY WITH FOREIGN BODY REMOVAL;  Surgeon: Ascencion Dike, MD;  Location: [facility] OR;  Service: ENT;  Laterality: N/A;  . Esophagogastroduodenoscopy Left 06/13/2014    Procedure: ESOPHAGOGASTRODUODENOSCOPY (EGD);  Surgeon: Juanita Craver, MD;  Location: [facility] ENDOSCOPY;  Service: Endoscopy;  Laterality: Left;    reports that she has quit smoking. Her smoking use included Cigarettes. She has a 20 pack-year smoking history. She does not have any smokeless tobacco history on file. She reports that she drinks alcohol. She reports that she does not use illicit drugs. family history includes Brain cancer in her mother; Cancer in her maternal grandmother; Heart disease in her mother; Lung cancer in her maternal aunt and paternal uncle; Stroke in her other. No Known Allergies Current Outpatient Prescriptions on File Prior to Visit  Medication Sig Dispense Refill  . acetaminophen (TYLENOL) 500 MG tablet Take 1,000 mg by mouth at bedtime as needed for headache.    Marland Kitchen amLODipine (NORVASC) 5 MG tablet TAKE 1 TABLET (5 MG TOTAL) BY MOUTH  DAILY. 30 tablet 10  . aspirin EC 81 MG tablet Take 1 tablet (81 mg total) by mouth daily. 30 tablet 0  . atorvastatin (LIPITOR) 40 MG tablet TAKE 1 TABLET (40 MG TOTAL) BY MOUTH DAILY AT 6 PM. 30 tablet 2  . carvedilol (COREG) 12.5 MG tablet TAKE 1 TABLET (12.5 MG TOTAL) BY MOUTH 2 (TWO) TIMES DAILY. 60 tablet 11  . diphenhydrAMINE (BENADRYL) 25 MG tablet Take 1 tablet (25 mg total) by mouth every 6 (six) hours as needed. 30 tablet 0  . fluticasone (FLONASE) 50 MCG/ACT nasal spray PLACE 2 SPRAYS INTO BOTH  NOSTRILS DAILY. 16 g 3  . hydrochlorothiazide (HYDRODIURIL) 12.5 MG tablet Take 1 tablet (12.5 mg total) by mouth daily. (Patient taking differently: Take 12.5 mg by mouth daily as needed. ) 90 tablet 3  . isosorbide mononitrate (IMDUR) 60 MG 24 hr tablet TAKE 1.5 TABLETS (90 MG TOTAL) BY MOUTH DAILY. 45 tablet 10  . PROVENTIL HFA 108 (90 BASE) MCG/ACT inhaler INHALE 1-2 PUFFS INTO THE LUNGS EVERY 6 (SIX) HOURS AS NEEDED FOR WHEEZING OR SHORTNESS OF BREATH. 6.7 Inhaler 5  . SPIRIVA HANDIHALER 18 MCG inhalation capsule PLACE 1 CAPSULE (18 MCG TOTAL) INTO INHALER AND INHALE DAILY. 30 capsule 11  . amoxicillin-clavulanate (AUGMENTIN) 875-125 MG tablet Take 1 tablet by mouth 2 (two) times daily. (Patient not taking: Reported on 10/19/2015) 20 tablet 0  . HYDROcodone-homatropine (HYCODAN) 5-1.5 MG/5ML syrup Take 5 mLs by mouth every 6 (six) hours as needed for cough. (Patient not taking: Reported on 10/19/2015) 180 mL 0  . predniSONE (DELTASONE) 10 MG tablet Take 40 mg for 3 days, then 30 mg x 3 days, then 20 mg x 3 days, then 10 mg x 3 days (Patient not taking: Reported on 10/19/2015) 30 tablet 0   No current facility-administered medications on file prior to visit.   Review of Systems Constitutional: Negative for increased diaphoresis, other activity, appetite or siginficant weight change other than noted HENT: Negative for worsening hearing loss, ear pain, facial swelling, mouth sores and neck stiffness.   Eyes: Negative for other worsening pain, redness or visual disturbance.  Respiratory: Negative for shortness of breath and wheezing  Cardiovascular: Negative for chest pain and palpitations.  Gastrointestinal: Negative for diarrhea, blood in stool, abdominal distention or other pain Genitourinary: Negative for hematuria, flank pain or change in urine volume.  Musculoskeletal: Negative for myalgias or other joint complaints.  Skin: Negative for color change and wound or drainage.  Neurological:  Negative for syncope and numbness. other than noted Hematological: Negative for adenopathy. or other swelling Psychiatric/Behavioral: Negative for hallucinations, SI, self-injury, decreased concentration or other worsening agitation.      Objective:   Physical Exam BP 102/60 mmHg  Pulse 80  Temp(Src) 98.5 F (36.9 C) (Oral)  Ht 5\' 1"  (1.549 m)  Wt 168 lb (76.204 kg)  BMI 31.76 kg/m2  SpO2 97% VS noted,  Constitutional: Pt is oriented to person, place, and time. Appears well-developed and well-nourished, in no significant distress Head: Normocephalic and atraumatic.  Right Ear: External ear normal.  Left Ear: External ear normal.  Nose: Nose normal.  Mouth/Throat: Oropharynx is clear and moist.  Eyes: Conjunctivae and EOM are normal. Pupils are equal, round, and reactive to light.  Neck: Normal range of motion. Neck supple. No JVD present. No tracheal deviation present or significant neck LA or mass Cardiovascular: Normal rate, regular rhythm, normal heart sounds and intact distal pulses.   Pulmonary/Chest: Effort normal and  breath sounds without rales or wheezing  Abdominal: Soft. Bowel sounds are normal. NT. No HSM  Musculoskeletal: Normal range of motion. Exhibits no edema.  Lymphadenopathy:  Has no cervical adenopathy.  Neurological: Pt is alert and oriented to person, place, and time. Pt has normal reflexes. No cranial nerve deficit. Motor grossly intact except LLE 4+ 5 diffusely, sens/dtr intact, gait antalgic favoring the left Spine: NT, but does have some incidnetly muscular spasm/tender to right high paralumbar area without skin change, swelling, rash No paravertebral  Skin: Skin is warm and dry. No rash noted.  Psychiatric:  Has normal mood and affect. Behavior is normal.     Assessment & Plan:

## 2015-10-19 NOTE — Assessment & Plan Note (Signed)
New onset, for pain control, muscle relaxer prn, predpac , MRI LS Spine, and refer ortho

## 2015-10-20 ENCOUNTER — Telehealth: Payer: Self-pay | Admitting: Internal Medicine

## 2015-10-20 LAB — HEPATITIS C ANTIBODY: HCV Ab: NEGATIVE

## 2015-10-20 MED ORDER — ROSUVASTATIN CALCIUM 20 MG PO TABS: 20.0000 mg | ORAL_TABLET | Freq: Every day | ORAL | Status: DC

## 2015-10-20 NOTE — Telephone Encounter (Signed)
Ok for dahlia to let pt know - ok to stop the lipitor if taking the crestor

## 2015-10-20 NOTE — Telephone Encounter (Signed)
Pt advised and states that she stopped lipitor a few months ago. Pt will start Crestor as advised.

## 2015-12-15 ENCOUNTER — Other Ambulatory Visit: Payer: Self-pay | Admitting: Cardiology

## 2016-01-04 ENCOUNTER — Other Ambulatory Visit: Payer: Self-pay | Admitting: Cardiology

## 2016-01-24 ENCOUNTER — Telehealth: Payer: Self-pay | Admitting: Internal Medicine

## 2016-01-24 MED ORDER — HYDROCODONE-ACETAMINOPHEN 10-325 MG PO TABS: 1.0000 | ORAL_TABLET | Freq: Four times a day (QID) | ORAL | Status: DC | PRN

## 2016-01-24 NOTE — Telephone Encounter (Signed)
Done hardcopy to Corinne  

## 2016-01-24 NOTE — Addendum Note (Signed)
Addended by: Biagio Borg on: 01/24/2016 12:49 PM   Modules accepted: Orders

## 2016-01-24 NOTE — Telephone Encounter (Signed)
Left patient a message, medication is in front office for pick up

## 2016-02-06 ENCOUNTER — Other Ambulatory Visit: Payer: Self-pay | Admitting: Cardiology

## 2016-02-14 ENCOUNTER — Other Ambulatory Visit: Payer: Self-pay | Admitting: Cardiology

## 2016-02-14 ENCOUNTER — Other Ambulatory Visit: Payer: Self-pay | Admitting: Internal Medicine

## 2016-02-16 ENCOUNTER — Ambulatory Visit (INDEPENDENT_AMBULATORY_CARE_PROVIDER_SITE_OTHER): Payer: 59 | Admitting: Cardiology

## 2016-02-16 ENCOUNTER — Encounter: Payer: Self-pay | Admitting: Cardiology

## 2016-02-16 VITALS — BP 130/86 | HR 61 | Ht 61.0 in | Wt 171.0 lb

## 2016-02-16 DIAGNOSIS — Z951 Presence of aortocoronary bypass graft: Secondary | ICD-10-CM | POA: Diagnosis not present

## 2016-02-16 DIAGNOSIS — Z720 Tobacco use: Secondary | ICD-10-CM

## 2016-02-16 DIAGNOSIS — I10 Essential (primary) hypertension: Secondary | ICD-10-CM | POA: Diagnosis not present

## 2016-02-16 NOTE — Progress Notes (Signed)
[facility]. [address], [address] [place], [place]  [address] Phone: [phone] Fax:  [phone]  Date:  02/16/2016   ID:  Rhonda, Fleming [birth year], MRN [id number]  PCP:  Cathlean Cower, MD   History of Present Illness: [name] is a 60 y.o. female with coronary artery disease status post bypass surgery in 02/2002, LIMA to LAD, RIMA to RCA, SVG to OM with COPD, hypertension here for followup.  Last hospitalization in August 2015 was secondary to chicken bone impaction in midesophagus.  I saw her in the hospital on 07/02/13. She was having hypertensive emergency/headache. Angina was medically managed with both beta blocker and isosorbide as well as statin and aspirin. Troponin was negative. Tobacco cessation encouraged.  At prior visit, her blood pressure was seen to be quite elevated.  Pin prick discomfort periodically in her chest. Does not sound cardiac. She reported other somatic complaints, one night she did drink too much wine and felt poor through the night, did not take her medicines, adding GERD-like sensation. This resolved. She also recalled when she had her bypass surgery in 2003 that her vein grafts would likely stay open for proximal 0.10 years. She is concerned about this. We discussed. Her last stress test in 2014 was overall reassuring.  Wt Readings from Last 3 Encounters:  02/16/16 171 lb (77.565 kg)  10/19/15 168 lb (76.204 kg)  08/29/15 169 lb (76.658 kg)     Past Medical History  Diagnosis Date  . Coronary artery disease   . Bronchitis   . Sinusitis   . Hypertension   . Headache   . Heart disease   . Hyperlipidemia   . CAD (coronary artery disease) 09/03/2013  . CHF (congestive heart failure) ([place])   . COPD (chronic obstructive pulmonary disease) ([place])   . Impaired glucose tolerance 08/20/2014    Past Surgical History  Procedure Laterality Date  . Coronary artery bypass graft  2003    Triple bypass  . Rigid esophagoscopy N/A 06/12/2014   Procedure: RIGID ESOPHAGOSCOPY WITH FOREIGN BODY REMOVAL;  Surgeon: Ascencion Dike, MD;  Location: [facility] OR;  Service: ENT;  Laterality: N/A;  . Esophagogastroduodenoscopy Left 06/13/2014    Procedure: ESOPHAGOGASTRODUODENOSCOPY (EGD);  Surgeon: Juanita Craver, MD;  Location: [facility] ENDOSCOPY;  Service: Endoscopy;  Laterality: Left;    Current Outpatient Prescriptions  Medication Sig Dispense Refill  . acetaminophen (TYLENOL) 500 MG tablet Take 1,000 mg by mouth at bedtime as needed for headache.    Marland Kitchen amLODipine (NORVASC) 5 MG tablet TAKE 1 TABLET BY MOUTH EVERY DAY 30 tablet 0  . aspirin EC 81 MG tablet Take 1 tablet (81 mg total) by mouth daily. 30 tablet 0  . carvedilol (COREG) 12.5 MG tablet TAKE 1 TABLET (12.5 MG TOTAL) BY MOUTH 2 (TWO) TIMES DAILY. 60 tablet 0  . cyclobenzaprine (FLEXERIL) 5 MG tablet Take 1 tablet (5 mg total) by mouth 3 (three) times daily as needed for muscle spasms. 60 tablet 1  . diphenhydrAMINE (BENADRYL) 25 MG tablet Take 1 tablet (25 mg total) by mouth every 6 (six) hours as needed. 30 tablet 0  . fluticasone (FLONASE) 50 MCG/ACT nasal spray PLACE 2 SPRAYS INTO BOTH NOSTRILS DAILY. 16 g 3  . hydrochlorothiazide (HYDRODIURIL) 12.5 MG tablet Take 1 tablet (12.5 mg total) by mouth daily. (Patient taking differently: Take 12.5 mg by mouth daily as needed. ) 90 tablet 3  . HYDROcodone-acetaminophen (NORCO) 10-325 MG tablet Take 1 tablet by  mouth every 6 (six) hours as needed. 60 tablet 0  . isosorbide mononitrate (IMDUR) 60 MG 24 hr tablet TAKE 1.5 TABLETS (90 MG TOTAL) BY MOUTH DAILY. 45 tablet 1  . PROVENTIL HFA 108 (90 BASE) MCG/ACT inhaler INHALE 1-2 PUFFS INTO THE LUNGS EVERY 6 (SIX) HOURS AS NEEDED FOR WHEEZING OR SHORTNESS OF BREATH. 6.7 Inhaler 5  . rosuvastatin (CRESTOR) 20 MG tablet Take 1 tablet (20 mg total) by mouth daily. 90 tablet 3  . SPIRIVA HANDIHALER 18 MCG inhalation capsule PLACE 1 CAPSULE (18 MCG TOTAL) INTO INHALER AND INHALE DAILY. 30 capsule 11   No current  facility-administered medications for this visit.    Allergies:   No Known Allergies  Social History:  The patient  reports that she has quit smoking. Her smoking use included Cigarettes. She has a 20 pack-year smoking history. She does not have any smokeless tobacco history on file. She reports that she drinks alcohol. She reports that she does not use illicit drugs.   Family History  Problem Relation Age of Onset  . Lung cancer Paternal Uncle   . Heart disease Mother   . Cancer Maternal Grandmother     colon  . Stroke Other   . Brain cancer Mother   . Cancer      x 4 aunts  . Lung cancer Maternal Aunt     ROS:  Please see the history of present illness.   No chest pain, no syncope, no palpitations  Right side of body feels puffy.  All other systems reviewed and negative.   PHYSICAL EXAM: VS:  BP 130/86 mmHg  Pulse 61  Ht 5\' 1"  (1.549 m)  Wt 171 lb (77.565 kg)  BMI 32.33 kg/m2 Well nourished, well developed, in no acute distress HEENT: normal, [place]/AT, EOMI Neck: no JVD, normal carotid upstroke, no bruit Cardiac:  normal S1, S2; RRR; no murmur Lungs:  clear to auscultation bilaterally, no wheezing, rhonchi or ralesChest scar noted Abd: soft, nontender, no hepatomegaly, no bruits Ext: no edema, 2+ distal pulses Skin: warm and dry GU: deferred Neuro: no focal abnormalities noted, AAO x 3  EKG:    EKG was ordered today. 02/16/16-sinus rhythm, 61, nonspecific T-wave changes personally viewed-2 other abnormalities. Sinus bradycardia rate 56, occasional PAC, nonspecific ST-T wave changes   ASSESSMENT AND PLAN:  1. Bone impaction of esophagus-Dr. Cyndia Bent, Dr. Benjamine Mola. Chicken bone and cartilage removed from midportion of the esophagus. 06/12/14.  2. Hypertension-carvedilol to 12.5 mg twice a day. Her pulse is reasonable today. I do understand she has had some sinus bradycardia in the past.  Continue with other medications. 3. Coronary artery disease-stable, no exertional angina. Prior  bypass. 2003. Atypical chest pain. If symptoms worsen, consider repeating stress test. 4. COPD - more fatigue 5. Tobacco use-encouraged cessation.  Signed, Candee Furbish, MD [facility]  02/16/2016 2:19 PM

## 2016-02-16 NOTE — Patient Instructions (Signed)

## 2016-02-20 ENCOUNTER — Encounter: Payer: Self-pay | Admitting: Cardiology

## 2016-02-20 ENCOUNTER — Other Ambulatory Visit: Payer: Self-pay | Admitting: Cardiology

## 2016-02-20 ENCOUNTER — Other Ambulatory Visit: Payer: Self-pay

## 2016-02-20 MED ORDER — ISOSORBIDE MONONITRATE ER 60 MG PO TB24: ORAL_TABLET | ORAL | Status: DC

## 2016-03-13 ENCOUNTER — Other Ambulatory Visit: Payer: Self-pay | Admitting: Cardiology

## 2016-03-15 ENCOUNTER — Other Ambulatory Visit: Payer: Self-pay | Admitting: Cardiology

## 2016-04-08 ENCOUNTER — Encounter: Payer: Self-pay | Admitting: Family

## 2016-04-08 ENCOUNTER — Ambulatory Visit (INDEPENDENT_AMBULATORY_CARE_PROVIDER_SITE_OTHER): Payer: 59 | Admitting: Family

## 2016-04-08 VITALS — BP 150/84 | HR 71 | Temp 97.9°F | Resp 16 | Ht 61.0 in | Wt 172.0 lb

## 2016-04-08 DIAGNOSIS — J0140 Acute pansinusitis, unspecified: Secondary | ICD-10-CM

## 2016-04-08 DIAGNOSIS — J019 Acute sinusitis, unspecified: Secondary | ICD-10-CM | POA: Insufficient documentation

## 2016-04-08 MED ORDER — HYDROCODONE-HOMATROPINE 5-1.5 MG/5ML PO SYRP: 5.0000 mL | ORAL_SOLUTION | Freq: Three times a day (TID) | ORAL | Status: DC | PRN

## 2016-04-08 MED ORDER — AMOXICILLIN-POT CLAVULANATE 875-125 MG PO TABS: 1.0000 | ORAL_TABLET | Freq: Two times a day (BID) | ORAL | Status: DC

## 2016-04-08 NOTE — Assessment & Plan Note (Signed)
Symptoms and exam consistent with sinusitis. Start Augmentin. Start Hycodan as needed for cough and sleep. Continue over-the-counter medications as needed for symptom relief and supportive care. Follow-up if symptoms worsen or do not improve.

## 2016-04-08 NOTE — Patient Instructions (Signed)
Thank you for choosing [facility].  Summary/Instructions:  Your prescription(s) have been submitted to your pharmacy or been printed and provided for you. Please take as directed and contact our office if you believe you are having problem(s) with the medication(s) or have any questions.  If your symptoms worsen or fail to improve, please contact our office for further instruction, or in case of emergency go directly to the emergency room at the closest medical facility.   General Recommendations:    Please drink plenty of fluids.  Get plenty of rest   Sleep in humidified air  Use saline nasal sprays  Netti pot   OTC Medications:  Decongestants - helps relieve congestion   Flonase (generic fluticasone) or Nasacort (generic triamcinolone) - please make sure to use the "cross-over" technique at a 45 degree angle towards the opposite eye as opposed to straight up the nasal passageway.   Sudafed (generic pseudoephedrine - Note this is the one that is available behind the pharmacy counter); Products with phenylephrine (-PE) may also be used but is often not as effective as pseudoephedrine.   If you have HIGH BLOOD PRESSURE - Coricidin HBP; AVOID any product that is -D as this contains pseudoephedrine which may increase your blood pressure.  Afrin (oxymetazoline) every 6-8 hours for up to 3 days.   Allergies - helps relieve runny nose, itchy eyes and sneezing   Claritin (generic loratidine), Allegra (fexofenidine), or Zyrtec (generic cyrterizine) for runny nose. These medications should not cause drowsiness.  Note - Benadryl (generic diphenhydramine) may be used however may cause drowsiness  Cough -   Delsym or Robitussin (generic dextromethorphan)  Expectorants - helps loosen mucus to ease removal   Mucinex (generic guaifenesin) as directed on the package.  Headaches / General Aches   Tylenol (generic acetaminophen) - DO NOT EXCEED 3 grams (3,000 mg) in a 24  hour time period  Advil/Motrin (generic ibuprofen)   Sore Throat -   Salt water gargle   Chloraseptic (generic benzocaine) spray or lozenges / Sucrets (generic dyclonine)    Sinusitis Sinusitis is redness, soreness, and inflammation of the paranasal sinuses. Paranasal sinuses are air pockets within the bones of your face (beneath the eyes, the middle of the forehead, or above the eyes). In healthy paranasal sinuses, mucus is able to drain out, and air is able to circulate through them by way of your nose. However, when your paranasal sinuses are inflamed, mucus and air can become trapped. This can allow bacteria and other germs to grow and cause infection. Sinusitis can develop quickly and last only a short time (acute) or continue over a long period (chronic). Sinusitis that lasts for more than 12 weeks is considered chronic.  CAUSES  Causes of sinusitis include:  Allergies.  Structural abnormalities, such as displacement of the cartilage that separates your nostrils (deviated septum), which can decrease the air flow through your nose and sinuses and affect sinus drainage.  Functional abnormalities, such as when the small hairs (cilia) that line your sinuses and help remove mucus do not work properly or are not present. SIGNS AND SYMPTOMS  Symptoms of acute and chronic sinusitis are the same. The primary symptoms are pain and pressure around the affected sinuses. Other symptoms include:  Upper toothache.  Earache.  Headache.  Bad breath.  Decreased sense of smell and taste.  A cough, which worsens when you are lying flat.  Fatigue.  Fever.  Thick drainage from your nose, which often is green and may   contain pus (purulent).  Swelling and warmth over the affected sinuses. DIAGNOSIS  Your health care provider will perform a physical exam. During the exam, your health care provider may:  Look in your nose for signs of abnormal growths in your nostrils (nasal  polyps).  Tap over the affected sinus to check for signs of infection.  View the inside of your sinuses (endoscopy) using an imaging device that has a light attached (endoscope). If your health care provider suspects that you have chronic sinusitis, one or more of the following tests may be recommended:  Allergy tests.  Nasal culture. A sample of mucus is taken from your nose, sent to a lab, and screened for bacteria.  Nasal cytology. A sample of mucus is taken from your nose and examined by your health care provider to determine if your sinusitis is related to an allergy. TREATMENT  Most cases of acute sinusitis are related to a viral infection and will resolve on their own within 10 days. Sometimes medicines are prescribed to help relieve symptoms (pain medicine, decongestants, nasal steroid sprays, or saline sprays).  However, for sinusitis related to a bacterial infection, your health care provider will prescribe antibiotic medicines. These are medicines that will help kill the bacteria causing the infection.  Rarely, sinusitis is caused by a fungal infection. In theses cases, your health care provider will prescribe antifungal medicine. For some cases of chronic sinusitis, surgery is needed. Generally, these are cases in which sinusitis recurs more than 3 times per year, despite other treatments. HOME CARE INSTRUCTIONS   Drink plenty of water. Water helps thin the mucus so your sinuses can drain more easily.  Use a humidifier.  Inhale steam 3 to 4 times a day (for example, sit in the bathroom with the shower running).  Apply a warm, moist washcloth to your face 3 to 4 times a day, or as directed by your health care provider.  Use saline nasal sprays to help moisten and clean your sinuses.  Take medicines only as directed by your health care provider.  If you were prescribed either an antibiotic or antifungal medicine, finish it all even if you start to feel better. SEEK IMMEDIATE  MEDICAL CARE IF:  You have increasing pain or severe headaches.  You have nausea, vomiting, or drowsiness.  You have swelling around your face.  You have vision problems.  You have a stiff neck.  You have difficulty breathing. MAKE SURE YOU:   Understand these instructions.  Will watch your condition.  Will get help right away if you are not doing well or get worse. Document Released: 10/07/2005 Document Revised: 02/21/2014 Document Reviewed: 10/22/2011 Exit[employer] Patient Information 2015 [employer]. This information is not intended to replace advice given to you by your health care provider. Make sure you discuss any questions you have with your health care provider.   

## 2016-04-08 NOTE — Progress Notes (Signed)
Pre visit review using our clinic review tool, if applicable. No additional management support is needed unless otherwise documented below in the visit note. 

## 2016-04-08 NOTE — Progress Notes (Signed)
Subjective:    Patient ID: [name], female    DOB: 1956/07/03, 60 y.o.   MRN: [id number]  Chief Complaint  Patient presents with  . Nasal Congestion    nasal congestion, productive cough, ears stopped up, runny eyes x5 days    HPI:  [name] is a 60 y.o. female who  has a past medical history of Coronary artery disease; Bronchitis; Sinusitis; Hypertension; Headache; Heart disease; Hyperlipidemia; CAD (coronary artery disease) (09/03/2013); CHF (congestive heart failure) ([place]); COPD (chronic obstructive pulmonary disease) ([place]); and Impaired glucose tolerance (08/20/2014). and presents today for an acute office visit.  This is a new problem. Associated symptoms of nasal congestion, productive cough with yellow sputum, ears feeling stopped up, and runny eyes have been going on for about 5 days. Coughing is worse at night.  Denies fevers. Modifying factors include drinking water and aspirin which has not helped at all. Course of the symptoms has been getting worse.  Denies recent antibiotics.   No Known Allergies   Current Outpatient Prescriptions on File Prior to Visit  Medication Sig Dispense Refill  . acetaminophen (TYLENOL) 500 MG tablet Take 1,000 mg by mouth at bedtime as needed for headache.    Marland Kitchen amLODipine (NORVASC) 5 MG tablet TAKE 1 TABLET BY MOUTH EVERY DAY 30 tablet 11  . aspirin EC 81 MG tablet Take 1 tablet (81 mg total) by mouth daily. 30 tablet 0  . carvedilol (COREG) 12.5 MG tablet TAKE 1 TABLET (12.5 MG TOTAL) BY MOUTH 2 (TWO) TIMES DAILY. 60 tablet 11  . cyclobenzaprine (FLEXERIL) 5 MG tablet Take 1 tablet (5 mg total) by mouth 3 (three) times daily as needed for muscle spasms. 60 tablet 1  . diphenhydrAMINE (BENADRYL) 25 MG tablet Take 1 tablet (25 mg total) by mouth every 6 (six) hours as needed. 30 tablet 0  . fluticasone (FLONASE) 50 MCG/ACT nasal spray PLACE 2 SPRAYS INTO BOTH NOSTRILS DAILY. 16 g 3  . hydrochlorothiazide (MICROZIDE) 12.5 MG capsule  Take 12.5 mg by mouth every other day.    Marland Kitchen HYDROcodone-acetaminophen (NORCO) 10-325 MG tablet Take 1 tablet by mouth every 6 (six) hours as needed. 60 tablet 0  . isosorbide mononitrate (IMDUR) 60 MG 24 hr tablet TAKE 1.5 TABLETS (90 MG TOTAL) BY MOUTH DAILY. 45 tablet 6  . PROVENTIL HFA 108 (90 BASE) MCG/ACT inhaler INHALE 1-2 PUFFS INTO THE LUNGS EVERY 6 (SIX) HOURS AS NEEDED FOR WHEEZING OR SHORTNESS OF BREATH. 6.7 Inhaler 5  . rosuvastatin (CRESTOR) 20 MG tablet Take 1 tablet (20 mg total) by mouth daily. 90 tablet 3  . SPIRIVA HANDIHALER 18 MCG inhalation capsule PLACE 1 CAPSULE (18 MCG TOTAL) INTO INHALER AND INHALE DAILY. 30 capsule 11   No current facility-administered medications on file prior to visit.     Review of Systems  Constitutional: Negative for fever and chills.  HENT: Positive for congestion, sinus pressure and sore throat.   Respiratory: Positive for cough and shortness of breath. Negative for chest tightness and wheezing.   Neurological: Positive for headaches.      Objective:    BP 150/84 mmHg  Pulse 71  Temp(Src) 97.9 F (36.6 C) (Oral)  Resp 16  Ht 5\' 1"  (1.549 m)  Wt 172 lb (78.019 kg)  BMI 32.52 kg/m2  SpO2 91% Nursing note and vital signs reviewed.  Physical Exam  Constitutional: She is oriented to person, place, and time. She appears well-developed and well-nourished.  HENT:  Right Ear:  Hearing, tympanic membrane, external ear and ear canal normal.  Left Ear: Hearing, tympanic membrane, external ear and ear canal normal.  Nose: Right sinus exhibits no maxillary sinus tenderness and no frontal sinus tenderness. Left sinus exhibits no maxillary sinus tenderness and no frontal sinus tenderness.  Mouth/Throat: Uvula is midline, oropharynx is clear and moist and mucous membranes are normal.  Neck: Neck supple.  Cardiovascular: Normal rate, regular rhythm, normal heart sounds and intact distal pulses.   Pulmonary/Chest: Effort normal and breath sounds  normal.  Neurological: She is alert and oriented to person, place, and time.  Skin: Skin is warm and dry.       Assessment & Plan:   Problem List Items Addressed This Visit      Respiratory   Sinusitis, acute - Primary    Symptoms and exam consistent with sinusitis. Start Augmentin. Start Hycodan as needed for cough and sleep. Continue over-the-counter medications as needed for symptom relief and supportive care. Follow-up if symptoms worsen or do not improve.      Relevant Medications   HYDROcodone-homatropine (HYCODAN) 5-1.5 MG/5ML syrup   amoxicillin-clavulanate (AUGMENTIN) 875-125 MG tablet       I am having Ms. Sarate start on HYDROcodone-homatropine and amoxicillin-clavulanate. I am also having her maintain her aspirin EC, acetaminophen, diphenhydrAMINE, fluticasone, PROVENTIL HFA, cyclobenzaprine, rosuvastatin, HYDROcodone-acetaminophen, SPIRIVA HANDIHALER, hydrochlorothiazide, isosorbide mononitrate, carvedilol, and amLODipine.   Meds ordered this encounter  Medications  . HYDROcodone-homatropine (HYCODAN) 5-1.5 MG/5ML syrup    Sig: Take 5 mLs by mouth every 8 (eight) hours as needed for cough.    Dispense:  180 mL    Refill:  0    Order Specific Question:  Supervising Provider    Answer:  Pricilla Holm A [id number]  . amoxicillin-clavulanate (AUGMENTIN) 875-125 MG tablet    Sig: Take 1 tablet by mouth 2 (two) times daily.    Dispense:  20 tablet    Refill:  0    Order Specific Question:  Supervising Provider    Answer:  Pricilla Holm A [id number]     Follow-up: Return if symptoms worsen or fail to improve.  Mauricio Po, FNP

## 2016-06-03 ENCOUNTER — Other Ambulatory Visit: Payer: Self-pay | Admitting: Internal Medicine

## 2016-06-04 ENCOUNTER — Telehealth: Payer: Self-pay | Admitting: Internal Medicine

## 2016-06-04 MED ORDER — HYDROCODONE-ACETAMINOPHEN 10-325 MG PO TABS: 1.0000 | ORAL_TABLET | Freq: Four times a day (QID) | ORAL | 0 refills | Status: DC | PRN

## 2016-06-04 MED ORDER — ALBUTEROL SULFATE HFA 108 (90 BASE) MCG/ACT IN AERS
INHALATION_SPRAY | RESPIRATORY_TRACT | 5 refills | Status: DC
Start: 2016-06-04 — End: 2016-12-10

## 2016-06-04 NOTE — Telephone Encounter (Signed)
Refill sent to pharmacy.   

## 2016-06-04 NOTE — Telephone Encounter (Signed)
Done hardcopy to Corinne  

## 2016-06-04 NOTE — Telephone Encounter (Signed)
Patient is requesting a refill of PROVENTIL HFA 108 (90 BASE) MCG/ACT inhaler [id number]

## 2016-06-04 NOTE — Telephone Encounter (Signed)
Called patient, unable to reach, left message to give [place] a call back, medication has been placed in the front office for pick up.

## 2016-06-05 ENCOUNTER — Other Ambulatory Visit: Payer: Self-pay

## 2016-06-05 MED ORDER — HYDROCODONE-ACETAMINOPHEN 10-325 MG PO TABS: 1.0000 | ORAL_TABLET | Freq: Four times a day (QID) | ORAL | 0 refills | Status: DC | PRN

## 2016-06-05 NOTE — Progress Notes (Signed)
New prescription printed

## 2016-07-30 ENCOUNTER — Telehealth: Payer: Self-pay | Admitting: Cardiology

## 2016-07-30 NOTE — Telephone Encounter (Signed)
I called Dr Durenda Age office back and advised that Ms. Mockbee does not need prophylaxis for dental exam according to her history and the AHA Guidelines. Faxed phone note to Dr Durenda Age office.

## 2016-07-30 NOTE — Telephone Encounter (Signed)
Request for surgical clearance:  1. What type of surgery is being performed? She having a cleaning today-pt is there waiting,please call or fax asap  2. When is this surgery scheduled? today   3. Are there any medications that need to be held prior to surgery and how long?Does she needs to be pre-medicated   4. Name of physician performing surgery? Dr Wyline Beady   5. What is your office phone and fax number? [phone] and fax is [phone]

## 2016-08-05 ENCOUNTER — Telehealth: Payer: Self-pay | Admitting: Cardiology

## 2016-08-05 NOTE — Telephone Encounter (Signed)
Returned call. Advised based on last clearance recommendation relayed on 10/10 OK to proceed w dental cleaning & single tooth extraction. Understanding verbalized.

## 2016-08-05 NOTE — Telephone Encounter (Signed)
New Message:   Pt is there at the dentist office now. They would like to know if it is alright to do dental extractions and fillings based on her Cardiac condition. Does she need to stop any medicine,does she need to be pre medicated,any advisory on what type of anesthetic she needs to use?Please call back today asap,pt is there waiting. I told them Dr Marlou Porch was not in the office at this time.

## 2016-09-09 ENCOUNTER — Other Ambulatory Visit: Payer: Self-pay | Admitting: Cardiology

## 2016-11-12 ENCOUNTER — Encounter: Payer: Self-pay | Admitting: Internal Medicine

## 2016-11-12 MED ORDER — ROSUVASTATIN CALCIUM 20 MG PO TABS: 20.0000 mg | ORAL_TABLET | Freq: Every day | ORAL | 3 refills | Status: DC

## 2016-11-28 ENCOUNTER — Ambulatory Visit (INDEPENDENT_AMBULATORY_CARE_PROVIDER_SITE_OTHER): Payer: 59 | Admitting: Nurse Practitioner

## 2016-11-28 VITALS — BP 150/80 | HR 72 | Temp 98.0°F | Ht 61.0 in | Wt 173.0 lb

## 2016-11-28 DIAGNOSIS — J0140 Acute pansinusitis, unspecified: Secondary | ICD-10-CM | POA: Diagnosis not present

## 2016-11-28 DIAGNOSIS — J441 Chronic obstructive pulmonary disease with (acute) exacerbation: Secondary | ICD-10-CM

## 2016-11-28 MED ORDER — METHYLPREDNISOLONE ACETATE 40 MG/ML IJ SUSP
40.0000 mg | Freq: Once | INTRAMUSCULAR | Status: AC
  Administered 2016-11-28: 40 mg via INTRAMUSCULAR

## 2016-11-28 MED ORDER — LEVOFLOXACIN 500 MG PO TABS
500.0000 mg | ORAL_TABLET | Freq: Every day | ORAL | 0 refills | Status: DC
Start: 2016-11-28 — End: 2016-12-08

## 2016-11-28 MED ORDER — DM-GUAIFENESIN ER 30-600 MG PO TB12: 1.0000 | ORAL_TABLET | Freq: Two times a day (BID) | ORAL | 0 refills | Status: DC | PRN

## 2016-11-28 MED ORDER — BENZONATATE 100 MG PO CAPS: 100.0000 mg | ORAL_CAPSULE | Freq: Three times a day (TID) | ORAL | 0 refills | Status: DC | PRN

## 2016-11-28 NOTE — Progress Notes (Signed)
Pre visit review using our clinic review tool, if applicable. No additional management support is needed unless otherwise documented below in the visit note. 

## 2016-11-28 NOTE — Progress Notes (Signed)
Subjective:  Patient ID: [name], female    DOB: 1956-08-10  Age: 61 y.o. MRN: [id number]  CC: Headache (headache,congestion,cough,low O2,SOB. )   Cough  This is a new problem. The current episode started 1 to 4 weeks ago. The problem has been gradually worsening. The problem occurs constantly. The cough is non-productive. Associated symptoms include ear congestion, headaches, myalgias, nasal congestion, postnasal drip, rhinorrhea, a sore throat, shortness of breath and wheezing. Pertinent negatives include no chest pain or chills. The symptoms are aggravated by lying down and cold air. She has tried a beta-agonist inhaler and OTC cough suppressant for the symptoms. The treatment provided no relief. Her past medical history is significant for bronchitis and COPD.    Outpatient Medications Prior to Visit  Medication Sig Dispense Refill  . acetaminophen (TYLENOL) 500 MG tablet Take 1,000 mg by mouth at bedtime as needed for headache.    . albuterol (PROVENTIL HFA) 108 (90 Base) MCG/ACT inhaler INHALE 1-2 PUFFS INTO THE LUNGS EVERY 6 (SIX) HOURS AS NEEDED FOR WHEEZING OR SHORTNESS OF BREATH. 6.7 Inhaler 5  . amLODipine (NORVASC) 5 MG tablet TAKE 1 TABLET BY MOUTH EVERY DAY 30 tablet 11  . aspirin EC 81 MG tablet Take 1 tablet (81 mg total) by mouth daily. 30 tablet 0  . carvedilol (COREG) 12.5 MG tablet TAKE 1 TABLET (12.5 MG TOTAL) BY MOUTH 2 (TWO) TIMES DAILY. 60 tablet 11  . cyclobenzaprine (FLEXERIL) 5 MG tablet Take 1 tablet (5 mg total) by mouth 3 (three) times daily as needed for muscle spasms. 60 tablet 1  . diphenhydrAMINE (BENADRYL) 25 MG tablet Take 1 tablet (25 mg total) by mouth every 6 (six) hours as needed. 30 tablet 0  . fluticasone (FLONASE) 50 MCG/ACT nasal spray PLACE 2 SPRAYS INTO BOTH NOSTRILS DAILY. 16 g 3  . hydrochlorothiazide (MICROZIDE) 12.5 MG capsule Take 12.5 mg by mouth every other day.    Marland Kitchen HYDROcodone-acetaminophen (NORCO) 10-325 MG tablet Take 1 tablet by  mouth every 6 (six) hours as needed. 60 tablet 0  . isosorbide mononitrate (IMDUR) 60 MG 24 hr tablet TAKE 1.5 TABLETS (90 MG TOTAL) BY MOUTH DAILY. 45 tablet 4  . rosuvastatin (CRESTOR) 20 MG tablet Take 1 tablet (20 mg total) by mouth daily. 90 tablet 3  . SPIRIVA HANDIHALER 18 MCG inhalation capsule PLACE 1 CAPSULE (18 MCG TOTAL) INTO INHALER AND INHALE DAILY. 30 capsule 11  . HYDROcodone-homatropine (HYCODAN) 5-1.5 MG/5ML syrup Take 5 mLs by mouth every 8 (eight) hours as needed for cough. 180 mL 0  . amoxicillin-clavulanate (AUGMENTIN) 875-125 MG tablet Take 1 tablet by mouth 2 (two) times daily. (Patient not taking: Reported on 11/28/2016) 20 tablet 0   No facility-administered medications prior to visit.     ROS See HPI  Objective:  BP (!) 150/80   Pulse 72   Temp 98 F (36.7 C)   Ht 5\' 1"  (1.549 m)   Wt 173 lb (78.5 kg)   SpO2 94%   BMI 32.69 kg/m   BP Readings from Last 3 Encounters:  11/28/16 (!) 150/80  04/08/16 (!) 150/84  02/16/16 130/86    Wt Readings from Last 3 Encounters:  11/28/16 173 lb (78.5 kg)  04/08/16 172 lb (78 kg)  02/16/16 171 lb (77.6 kg)    Physical Exam  Constitutional: She is oriented to person, place, and time.  HENT:  Right Ear: Tympanic membrane, external ear and ear canal normal.  Left Ear: Tympanic membrane, external  ear and ear canal normal.  Nose: Mucosal edema and rhinorrhea present. Right sinus exhibits maxillary sinus tenderness and frontal sinus tenderness. Left sinus exhibits maxillary sinus tenderness and frontal sinus tenderness.  Mouth/Throat: Uvula is midline. No trismus in the jaw. Posterior oropharyngeal erythema present. No oropharyngeal exudate.  Eyes: No scleral icterus.  Neck: Normal range of motion. Neck supple.  Cardiovascular: Normal rate and normal heart sounds.   Pulmonary/Chest: Effort normal and breath sounds normal.  Musculoskeletal: She exhibits no edema.  Lymphadenopathy:    She has cervical adenopathy.    Neurological: She is alert and oriented to person, place, and time.  Vitals reviewed.   Lab Results  Component Value Date   WBC 8.6 10/19/2015   HGB 16.9 (H) 10/19/2015   HCT 51.4 (H) 10/19/2015   PLT 198.0 10/19/2015   GLUCOSE 78 10/19/2015   CHOL 239 (H) 10/19/2015   TRIG 270.0 (H) 10/19/2015   HDL 43.20 10/19/2015   LDLDIRECT 153.0 10/19/2015   LDLCALC 68 02/02/2014   ALT 36 (H) 10/19/2015   AST 23 10/19/2015   NA 143 10/19/2015   K 3.9 10/19/2015   CL 104 10/19/2015   CREATININE 0.62 10/19/2015   BUN 14 10/19/2015   CO2 32 10/19/2015   TSH 1.54 10/19/2015   HGBA1C 6.3 10/19/2015    Dg Chest 2 View  Result Date: 08/30/2015 CLINICAL DATA:  Persistent productive cough, AND bad tasting green sputum after prednisone course x 1 mo, recent x-smoker, hx of bronchitis, HTN, x-smoker, CABG. EXAM: CHEST  2 VIEW COMPARISON:  06/12/2014 FINDINGS: Hyperinflation. Prior median sternotomy. Midline trachea. Mild cardiomegaly. No pleural effusion or pneumothorax. Right upper lobe scarring. Chronic interstitial thickening. IMPRESSION: No acute cardiopulmonary disease. Hyperinflation with chronic interstitial thickening, suggesting COPD/chronic bronchitis. Cardiomegaly without congestive failure. Electronically Signed   By: Abigail Miyamoto M.D.   On: 08/30/2015 09:05    Assessment & Plan:   [name] was seen today for headache.  Diagnoses and all orders for this visit:  COPD with acute exacerbation (HCC) -     methylPREDNISolone acetate (DEPO-MEDROL) injection 40 mg; Inject 1 mL (40 mg total) into the muscle once. -     levofloxacin (LEVAQUIN) 500 MG tablet; Take 1 tablet (500 mg total) by mouth daily. -     dextromethorphan-guaiFENesin (MUCINEX DM) 30-600 MG 12hr tablet; Take 1 tablet by mouth 2 (two) times daily as needed for cough. -     benzonatate (TESSALON) 100 MG capsule; Take 1 capsule (100 mg total) by mouth 3 (three) times daily as needed for cough.  Acute pansinusitis, recurrence  not specified -     methylPREDNISolone acetate (DEPO-MEDROL) injection 40 mg; Inject 1 mL (40 mg total) into the muscle once. -     levofloxacin (LEVAQUIN) 500 MG tablet; Take 1 tablet (500 mg total) by mouth daily. -     dextromethorphan-guaiFENesin (MUCINEX DM) 30-600 MG 12hr tablet; Take 1 tablet by mouth 2 (two) times daily as needed for cough. -     benzonatate (TESSALON) 100 MG capsule; Take 1 capsule (100 mg total) by mouth 3 (three) times daily as needed for cough.   I have discontinued Ms. Kent's HYDROcodone-homatropine and amoxicillin-clavulanate. I am also having her start on levofloxacin, dextromethorphan-guaiFENesin, and benzonatate. Additionally, I am having her maintain her aspirin EC, acetaminophen, diphenhydrAMINE, fluticasone, cyclobenzaprine, SPIRIVA HANDIHALER, hydrochlorothiazide, carvedilol, amLODipine, albuterol, HYDROcodone-acetaminophen, isosorbide mononitrate, and rosuvastatin. We will continue to administer methylPREDNISolone acetate.  Meds ordered this encounter  Medications  . methylPREDNISolone acetate (  DEPO-MEDROL) injection 40 mg  . levofloxacin (LEVAQUIN) 500 MG tablet    Sig: Take 1 tablet (500 mg total) by mouth daily.    Dispense:  7 tablet    Refill:  0    Order Specific Question:   Supervising Provider    Answer:   Cassandria Anger [[id number]]  . dextromethorphan-guaiFENesin (MUCINEX DM) 30-600 MG 12hr tablet    Sig: Take 1 tablet by mouth 2 (two) times daily as needed for cough.    Dispense:  14 tablet    Refill:  0    Order Specific Question:   Supervising Provider    Answer:   Cassandria Anger [[id number]]  . benzonatate (TESSALON) 100 MG capsule    Sig: Take 1 capsule (100 mg total) by mouth 3 (three) times daily as needed for cough.    Dispense:  20 capsule    Refill:  0    Order Specific Question:   Supervising Provider    Answer:   Cassandria Anger [[id number]]    Follow-up: Return if symptoms worsen or fail to improve.  Wilfred Lacy,  NP

## 2016-11-28 NOTE — Patient Instructions (Signed)
Return to office if no improvement in 1week.  Encourage adequate oral hydration.

## 2016-11-29 ENCOUNTER — Encounter: Payer: Self-pay | Admitting: Nurse Practitioner

## 2016-12-08 ENCOUNTER — Emergency Department (HOSPITAL_COMMUNITY): Payer: Commercial Managed Care - HMO

## 2016-12-08 ENCOUNTER — Encounter (HOSPITAL_COMMUNITY): Payer: Self-pay | Admitting: *Deleted

## 2016-12-08 ENCOUNTER — Inpatient Hospital Stay (HOSPITAL_COMMUNITY)
Admission: EM | Admit: 2016-12-08 | Discharge: 2016-12-10 | DRG: 190 | Disposition: A | Discharge status: Home / Self Care | Payer: Commercial Managed Care - HMO | Attending: Family Medicine | Admitting: Family Medicine

## 2016-12-08 DIAGNOSIS — J9601 Acute respiratory failure with hypoxia: Secondary | ICD-10-CM | POA: Diagnosis present

## 2016-12-08 DIAGNOSIS — J9621 Acute and chronic respiratory failure with hypoxia: Secondary | ICD-10-CM | POA: Diagnosis present

## 2016-12-08 DIAGNOSIS — Z8249 Family history of ischemic heart disease and other diseases of the circulatory system: Secondary | ICD-10-CM

## 2016-12-08 DIAGNOSIS — F17210 Nicotine dependence, cigarettes, uncomplicated: Secondary | ICD-10-CM | POA: Diagnosis present

## 2016-12-08 DIAGNOSIS — E785 Hyperlipidemia, unspecified: Secondary | ICD-10-CM | POA: Diagnosis present

## 2016-12-08 DIAGNOSIS — Z7951 Long term (current) use of inhaled steroids: Secondary | ICD-10-CM

## 2016-12-08 DIAGNOSIS — D751 Secondary polycythemia: Secondary | ICD-10-CM | POA: Diagnosis present

## 2016-12-08 DIAGNOSIS — Z801 Family history of malignant neoplasm of trachea, bronchus and lung: Secondary | ICD-10-CM | POA: Diagnosis not present

## 2016-12-08 DIAGNOSIS — Z7982 Long term (current) use of aspirin: Secondary | ICD-10-CM | POA: Diagnosis not present

## 2016-12-08 DIAGNOSIS — Z808 Family history of malignant neoplasm of other organs or systems: Secondary | ICD-10-CM | POA: Diagnosis not present

## 2016-12-08 DIAGNOSIS — Z951 Presence of aortocoronary bypass graft: Secondary | ICD-10-CM

## 2016-12-08 DIAGNOSIS — J441 Chronic obstructive pulmonary disease with (acute) exacerbation: Principal | ICD-10-CM | POA: Diagnosis present

## 2016-12-08 DIAGNOSIS — I2510 Atherosclerotic heart disease of native coronary artery without angina pectoris: Secondary | ICD-10-CM | POA: Diagnosis present

## 2016-12-08 DIAGNOSIS — K219 Gastro-esophageal reflux disease without esophagitis: Secondary | ICD-10-CM | POA: Diagnosis present

## 2016-12-08 DIAGNOSIS — R0602 Shortness of breath: Secondary | ICD-10-CM

## 2016-12-08 DIAGNOSIS — R0902 Hypoxemia: Secondary | ICD-10-CM | POA: Diagnosis not present

## 2016-12-08 DIAGNOSIS — I10 Essential (primary) hypertension: Secondary | ICD-10-CM | POA: Diagnosis present

## 2016-12-08 DIAGNOSIS — F17200 Nicotine dependence, unspecified, uncomplicated: Secondary | ICD-10-CM | POA: Diagnosis present

## 2016-12-08 LAB — CBC
HCT: 54.8 % — ABNORMAL HIGH (ref 36.0–46.0)
Hemoglobin: 17.8 g/dL — ABNORMAL HIGH (ref 12.0–15.0)
MCH: 28.9 pg (ref 26.0–34.0)
MCHC: 32.5 g/dL (ref 30.0–36.0)
MCV: 89.1 fL (ref 78.0–100.0)
PLATELETS: 182 10*3/uL (ref 150–400)
RBC: 6.15 MIL/uL — AB (ref 3.87–5.11)
RDW: 14.1 % (ref 11.5–15.5)
WBC: 10.4 10*3/uL (ref 4.0–10.5)

## 2016-12-08 LAB — BASIC METABOLIC PANEL
Anion gap: 12 (ref 5–15)
BUN: 8 mg/dL (ref 6–20)
CO2: 28 mmol/L (ref 22–32)
CREATININE: 0.71 mg/dL (ref 0.44–1.00)
Calcium: 9.4 mg/dL (ref 8.9–10.3)
Chloride: 99 mmol/L — ABNORMAL LOW (ref 101–111)
Glucose, Bld: 118 mg/dL — ABNORMAL HIGH (ref 65–99)
Potassium: 4.3 mmol/L (ref 3.5–5.1)
SODIUM: 139 mmol/L (ref 135–145)

## 2016-12-08 LAB — I-STAT TROPONIN, ED: TROPONIN I, POC: 0 ng/mL (ref 0.00–0.08)

## 2016-12-08 MED ORDER — ACETAMINOPHEN 325 MG PO TABS
650.0000 mg | ORAL_TABLET | Freq: Four times a day (QID) | ORAL | Status: DC | PRN
  Administered 2016-12-10: 650 mg via ORAL
  Filled 2016-12-08: qty 2

## 2016-12-08 MED ORDER — ALBUTEROL SULFATE (2.5 MG/3ML) 0.083% IN NEBU
2.5000 mg | INHALATION_SOLUTION | RESPIRATORY_TRACT | Status: DC
  Administered 2016-12-09: 2.5 mg via RESPIRATORY_TRACT
  Filled 2016-12-08: qty 3

## 2016-12-08 MED ORDER — ROSUVASTATIN CALCIUM 20 MG PO TABS
20.0000 mg | ORAL_TABLET | Freq: Every day | ORAL | Status: DC
  Administered 2016-12-09: 20 mg via ORAL
  Filled 2016-12-08: qty 1

## 2016-12-08 MED ORDER — DOXYCYCLINE HYCLATE 100 MG IV SOLR
100.0000 mg | Freq: Two times a day (BID) | INTRAVENOUS | Status: DC
  Administered 2016-12-09: 100 mg via INTRAVENOUS
  Filled 2016-12-08 (×2): qty 100

## 2016-12-08 MED ORDER — ASPIRIN EC 81 MG PO TBEC
81.0000 mg | DELAYED_RELEASE_TABLET | Freq: Every day | ORAL | Status: DC
  Administered 2016-12-09 – 2016-12-10 (×2): 81 mg via ORAL
  Filled 2016-12-08 (×2): qty 1

## 2016-12-08 MED ORDER — ALBUTEROL (5 MG/ML) CONTINUOUS INHALATION SOLN
INHALATION_SOLUTION | RESPIRATORY_TRACT | Status: AC
  Filled 2016-12-08: qty 20

## 2016-12-08 MED ORDER — METHYLPREDNISOLONE SODIUM SUCC 40 MG IJ SOLR
40.0000 mg | Freq: Three times a day (TID) | INTRAMUSCULAR | Status: DC
  Administered 2016-12-09 – 2016-12-10 (×5): 40 mg via INTRAVENOUS
  Filled 2016-12-08 (×5): qty 1

## 2016-12-08 MED ORDER — IPRATROPIUM BROMIDE 0.02 % IN SOLN
0.5000 mg | Freq: Once | RESPIRATORY_TRACT | Status: AC
  Administered 2016-12-08: 0.5 mg via RESPIRATORY_TRACT

## 2016-12-08 MED ORDER — ALBUTEROL (5 MG/ML) CONTINUOUS INHALATION SOLN
10.0000 mg/h | INHALATION_SOLUTION | RESPIRATORY_TRACT | Status: DC
  Administered 2016-12-08: 10 mg/h via RESPIRATORY_TRACT

## 2016-12-08 MED ORDER — DM-GUAIFENESIN ER 30-600 MG PO TB12
1.0000 | ORAL_TABLET | Freq: Two times a day (BID) | ORAL | Status: DC | PRN
  Filled 2016-12-08: qty 1

## 2016-12-08 MED ORDER — IPRATROPIUM-ALBUTEROL 0.5-2.5 (3) MG/3ML IN SOLN
3.0000 mL | RESPIRATORY_TRACT | Status: AC
  Administered 2016-12-08: 3 mL via RESPIRATORY_TRACT
  Filled 2016-12-08: qty 3

## 2016-12-08 MED ORDER — PREDNISONE 20 MG PO TABS
60.0000 mg | ORAL_TABLET | Freq: Once | ORAL | Status: AC
  Administered 2016-12-08: 60 mg via ORAL
  Filled 2016-12-08: qty 3

## 2016-12-08 MED ORDER — CYCLOBENZAPRINE HCL 10 MG PO TABS: 5.0000 mg | ORAL_TABLET | Freq: Three times a day (TID) | ORAL | Status: DC | PRN

## 2016-12-08 MED ORDER — HYDROCODONE-ACETAMINOPHEN 10-325 MG PO TABS
1.0000 | ORAL_TABLET | Freq: Four times a day (QID) | ORAL | Status: DC | PRN
  Filled 2016-12-08: qty 1

## 2016-12-08 MED ORDER — ONDANSETRON HCL 4 MG PO TABS: 4.0000 mg | ORAL_TABLET | Freq: Four times a day (QID) | ORAL | Status: DC | PRN

## 2016-12-08 MED ORDER — IPRATROPIUM BROMIDE 0.02 % IN SOLN
RESPIRATORY_TRACT | Status: AC
  Filled 2016-12-08: qty 2.5

## 2016-12-08 MED ORDER — ONDANSETRON HCL 4 MG/2ML IJ SOLN: 4.0000 mg | Freq: Four times a day (QID) | INTRAMUSCULAR | Status: DC | PRN

## 2016-12-08 MED ORDER — IPRATROPIUM BROMIDE 0.02 % IN SOLN
0.5000 mg | RESPIRATORY_TRACT | Status: DC
  Administered 2016-12-09: 0.5 mg via RESPIRATORY_TRACT
  Filled 2016-12-08: qty 2.5

## 2016-12-08 MED ORDER — CARVEDILOL 12.5 MG PO TABS
12.5000 mg | ORAL_TABLET | Freq: Two times a day (BID) | ORAL | Status: DC
  Administered 2016-12-09 – 2016-12-10 (×2): 12.5 mg via ORAL
  Filled 2016-12-08 (×2): qty 1

## 2016-12-08 MED ORDER — ISOSORBIDE MONONITRATE ER 30 MG PO TB24
90.0000 mg | ORAL_TABLET | Freq: Every day | ORAL | Status: DC
  Administered 2016-12-09 – 2016-12-10 (×2): 90 mg via ORAL
  Filled 2016-12-08 (×2): qty 3

## 2016-12-08 MED ORDER — BENZONATATE 100 MG PO CAPS: 100.0000 mg | ORAL_CAPSULE | Freq: Three times a day (TID) | ORAL | Status: DC | PRN

## 2016-12-08 MED ORDER — AMLODIPINE BESYLATE 5 MG PO TABS
5.0000 mg | ORAL_TABLET | Freq: Every day | ORAL | Status: DC
  Administered 2016-12-10: 5 mg via ORAL
  Filled 2016-12-08 (×2): qty 1

## 2016-12-08 MED ORDER — ENOXAPARIN SODIUM 40 MG/0.4ML ~~LOC~~ SOLN
40.0000 mg | SUBCUTANEOUS | Status: DC
  Administered 2016-12-09 – 2016-12-10 (×2): 40 mg via SUBCUTANEOUS
  Filled 2016-12-08 (×2): qty 0.4

## 2016-12-08 MED ORDER — ALBUTEROL SULFATE (2.5 MG/3ML) 0.083% IN NEBU: 2.5000 mg | INHALATION_SOLUTION | RESPIRATORY_TRACT | Status: DC | PRN

## 2016-12-08 MED ORDER — FLUTICASONE PROPIONATE 50 MCG/ACT NA SUSP
2.0000 | Freq: Every day | NASAL | Status: DC
  Administered 2016-12-10: 2 via NASAL
  Filled 2016-12-08: qty 16

## 2016-12-08 MED ORDER — ACETAMINOPHEN 650 MG RE SUPP: 650.0000 mg | Freq: Four times a day (QID) | RECTAL | Status: DC | PRN

## 2016-12-08 NOTE — H&P (Addendum)
History and Physical    [name] [id number] DOB: 1956/03/29 DOA: 12/08/2016  PCP: Cathlean Cower, MD  Patient coming from: Home.  Chief Complaint: Shortness of breath.  HPI: [name] is a 61 y.o. female with history of COPD, hypertension and hyperlipidemia presents with complaints of worsening shortness of breath. Patient had gone to her PCP on 11/28/2016 and was given steroids and Levaquin despite taking which patient was still short of breath. In the ER patient had required nebulizer treatment and steroids despite which patient is still short of breath. Chest x-ray does not show any acute infiltrates. Denies any chest pain. Patient states she gets short of breath on exertion and also at rest.   ED Course: Nebulizer treatment. Solu-Medrol.  Review of Systems: As per HPI, rest all negative.   Past Medical History:  Diagnosis Date  . Bronchitis   . CAD (coronary artery disease) 09/03/2013  . CHF (congestive heart failure) ([place])   . COPD (chronic obstructive pulmonary disease) ([place])   . Coronary artery disease   . Headache   . Heart disease   . Hyperlipidemia   . Hypertension   . Impaired glucose tolerance 08/20/2014  . Sinusitis     Past Surgical History:  Procedure Laterality Date  . CORONARY ARTERY BYPASS GRAFT  2003   Triple bypass  . ESOPHAGOGASTRODUODENOSCOPY Left 06/13/2014   Procedure: ESOPHAGOGASTRODUODENOSCOPY (EGD);  Surgeon: Juanita Craver, MD;  Location: [facility] ENDOSCOPY;  Service: Endoscopy;  Laterality: Left;  . RIGID ESOPHAGOSCOPY N/A 06/12/2014   Procedure: RIGID ESOPHAGOSCOPY WITH FOREIGN BODY REMOVAL;  Surgeon: Ascencion Dike, MD;  Location: [facility] OR;  Service: ENT;  Laterality: N/A;     reports that she has quit smoking. Her smoking use included Cigarettes. She has a 20.00 pack-year smoking history. She has never used smokeless tobacco. She reports that she drinks alcohol. She reports that she does not use drugs.  No Known Allergies  Family History    Problem Relation Age of Onset  . Lung cancer Paternal Uncle   . Heart disease Mother   . Brain cancer Mother   . Cancer Maternal Grandmother     colon  . Stroke Other   . Cancer      x 4 aunts  . Lung cancer Maternal Aunt     Prior to Admission medications   Medication Sig Start Date End Date Taking? Authorizing Provider  acetaminophen (TYLENOL) 500 MG tablet Take 1,000 mg by mouth at bedtime as needed for headache.   Yes Historical Provider, MD  albuterol (PROVENTIL HFA) 108 (90 Base) MCG/ACT inhaler INHALE 1-2 PUFFS INTO THE LUNGS EVERY 6 (SIX) HOURS AS NEEDED FOR WHEEZING OR SHORTNESS OF BREATH. 06/04/16  Yes Biagio Borg, MD  amLODipine (NORVASC) 5 MG tablet TAKE 1 TABLET BY MOUTH EVERY DAY 03/15/16  Yes Jerline Pain, MD  aspirin EC 81 MG tablet Take 1 tablet (81 mg total) by mouth daily. 07/02/13  Yes Olam Idler, MD  benzonatate (TESSALON) 100 MG capsule Take 1 capsule (100 mg total) by mouth 3 (three) times daily as needed for cough. 11/28/16  Yes Charlene Brooke Nche, NP  carvedilol (COREG) 12.5 MG tablet TAKE 1 TABLET (12.5 MG TOTAL) BY MOUTH 2 (TWO) TIMES DAILY. 03/14/16  Yes Jerline Pain, MD  cyclobenzaprine (FLEXERIL) 5 MG tablet Take 1 tablet (5 mg total) by mouth 3 (three) times daily as needed for muscle spasms. 10/19/15  Yes Biagio Borg, MD  dextromethorphan-guaiFENesin [facility] DM)  30-600 MG 12hr tablet Take 1 tablet by mouth 2 (two) times daily as needed for cough. 11/28/16  Yes Charlene Brooke Nche, NP  diphenhydrAMINE (BENADRYL) 25 MG tablet Take 1 tablet (25 mg total) by mouth every 6 (six) hours as needed. Patient taking differently: Take 25 mg by mouth every 6 (six) hours as needed for allergies.  07/07/14  Yes Hoyt Koch, MD  fluticasone (FLONASE) 50 MCG/ACT nasal spray PLACE 2 SPRAYS INTO BOTH NOSTRILS DAILY. Patient taking differently: PLACE 2 SPRAYS INTO BOTH NOSTRILS DAILY AS NEEDED FOR ALLERGIES 09/22/14  Yes Biagio Borg, MD  hydrochlorothiazide (MICROZIDE)  12.5 MG capsule Take 12.5 mg by mouth daily as needed (fluid).    Yes Historical Provider, MD  HYDROcodone-acetaminophen (NORCO) 10-325 MG tablet Take 1 tablet by mouth every 6 (six) hours as needed. Patient taking differently: Take 1 tablet by mouth every 6 (six) hours as needed for moderate pain.  06/05/16  Yes Biagio Borg, MD  isosorbide mononitrate (IMDUR) 60 MG 24 hr tablet TAKE 1.5 TABLETS (90 MG TOTAL) BY MOUTH DAILY. 09/09/16  Yes Jerline Pain, MD  rosuvastatin (CRESTOR) 20 MG tablet Take 1 tablet (20 mg total) by mouth daily. 11/12/16  Yes Biagio Borg, MD  SPIRIVA HANDIHALER 18 MCG inhalation capsule PLACE 1 CAPSULE (18 MCG TOTAL) INTO INHALER AND INHALE DAILY. 02/14/16  Yes Biagio Borg, MD    Physical Exam: Vitals:   12/08/16 2028 12/08/16 2100 12/08/16 2115 12/08/16 2300  BP: 137/71 135/76 117/82 99/59  Pulse: 76 82 75 67  Resp: 18 23 23  (!) 32  Temp:      TempSrc:      SpO2: 99% 97% 98% 95%  Weight:          Constitutional: Moderately built and nourished. Vitals:   12/08/16 2028 12/08/16 2100 12/08/16 2115 12/08/16 2300  BP: 137/71 135/76 117/82 99/59  Pulse: 76 82 75 67  Resp: 18 23 23  (!) 32  Temp:      TempSrc:      SpO2: 99% 97% 98% 95%  Weight:       Eyes: Anicteric no pallor. ENMT: No discharge from the ears eyes nose and mouth. Neck: No neck rigidity no JVD appreciated. Respiratory: Mild expiratory wheeze no crepitations. Cardiovascular: S1-S2 heard no murmurs appreciated. Abdomen: Soft nontender bowel sounds present. No guarding or rigidity. Musculoskeletal: No edema. No joint effusion. Skin: No rash. Skin appears warm. Neurologic: Alert awake oriented to time place and person. Moves all extremities. Psychiatric: Appears normal. Normal affect.   Labs on Admission: I have personally reviewed following labs and imaging studies  CBC:  Recent Labs Lab 12/08/16 1932  WBC 10.4  HGB 17.8*  HCT 54.8*  MCV 89.1  PLT [id number]   Basic Metabolic  Panel:  Recent Labs Lab 12/08/16 1932  NA 139  K 4.3  CL 99*  CO2 28  GLUCOSE 118*  BUN 8  CREATININE 0.71  CALCIUM 9.4   GFR: Estimated Creatinine Clearance: 69.4 mL/min (by C-G formula based on SCr of 0.71 mg/dL). Liver Function Tests: No results for input(s): AST, ALT, ALKPHOS, BILITOT, PROT, ALBUMIN in the last 168 hours. No results for input(s): LIPASE, AMYLASE in the last 168 hours. No results for input(s): AMMONIA in the last 168 hours. Coagulation Profile: No results for input(s): INR, PROTIME in the last 168 hours. Cardiac Enzymes: No results for input(s): CKTOTAL, CKMB, CKMBINDEX, TROPONINI in the last 168 hours. BNP (last 3 results) No results  for input(s): PROBNP in the last 8760 hours. HbA1C: No results for input(s): HGBA1C in the last 72 hours. CBG: No results for input(s): GLUCAP in the last 168 hours. Lipid Profile: No results for input(s): CHOL, HDL, LDLCALC, TRIG, CHOLHDL, LDLDIRECT in the last 72 hours. Thyroid Function Tests: No results for input(s): TSH, T4TOTAL, FREET4, T3FREE, THYROIDAB in the last 72 hours. Anemia Panel: No results for input(s): VITAMINB12, FOLATE, FERRITIN, TIBC, IRON, RETICCTPCT in the last 72 hours. Urine analysis:    Component Value Date/Time   COLORURINE YELLOW 10/19/2015 1018   APPEARANCEUR CLEAR 10/19/2015 1018   LABSPEC 1.025 10/19/2015 1018   PHURINE 5.5 10/19/2015 1018   GLUCOSEU NEGATIVE 10/19/2015 1018   HGBUR NEGATIVE 10/19/2015 1018   BILIRUBINUR NEGATIVE 10/19/2015 1018   KETONESUR NEGATIVE 10/19/2015 1018   PROTEINUR NEGATIVE 02/01/2014 2131   UROBILINOGEN 0.2 10/19/2015 1018   NITRITE NEGATIVE 10/19/2015 1018   LEUKOCYTESUR NEGATIVE 10/19/2015 1018   Sepsis Labs: @LABRCNTIP (procalcitonin:4,lacticidven:4) )No results found for this or any previous visit (from the past 240 hour(s)).   Radiological Exams on Admission: Dg Chest 2 View  Result Date: 12/08/2016 CLINICAL DATA:  Acute onset of generalized  chest congestion. Sinus infection. Shortness of breath. Initial encounter. EXAM: CHEST  2 VIEW COMPARISON:  Chest radiograph performed 08/30/2015 FINDINGS: The lungs are well-aerated. Mild chronically increased interstitial markings are again noted. There is no evidence of pleural effusion or pneumothorax. The heart is normal in size. The patient is status post median sternotomy, with evidence of prior CABG. Vascular congestion is seen. No acute osseous abnormalities are seen. IMPRESSION: Vascular congestion noted. Mild chronically increased interstitial markings again noted. Electronically Signed   By: Garald Balding M.D.   On: 12/08/2016 19:43     Assessment/Plan Principal Problem:   Acute respiratory failure with hypoxia (HCC) Active Problems:   Essential hypertension   COPD exacerbation ([place])    1. Acute respiratory failure with hypoxia secondary to COPD exacerbation - I have placed patient on Solu-Medrol 40 IV every 8. Will continue on scheduled dose of DuoNeb and when necessary DuoNeb. Pulmicort. Doxycycline. Since patient appears more short of breath and wheezing I have ordered d-dimer and if positive will get CT angiogram of the chest. Check influenza PCR. 2. Hypertension on amlodipine Coreg Imdur and hydrochlorothiazide. 3. Hyperlipidemia on statins. 4. Tobacco abuse - advised to quit smoking. 5. Polycythemia probably from COPD. Follow as outpatient.   DVT prophylaxis: Lovenox. Code Status: Full code.  Family Communication: Discussed with patient.  Disposition Plan: Home.  Consults called: None.  Admission status: Inpatient.    Rise Patience MD [facility] Pager [phone].  If 7PM-7AM, please contact night-coverage [email or web address] Password [facility]  12/08/2016, 11:50 PM

## 2016-12-08 NOTE — ED Notes (Signed)
Pt in radiology at this time. 

## 2016-12-08 NOTE — ED Notes (Signed)
RT called for cont neb 

## 2016-12-08 NOTE — ED Notes (Signed)
Delay in lab draw,  en route to [facility]

## 2016-12-08 NOTE — ED Notes (Signed)
Pt sat up to 94-96% on 02 after neb tx, pt states she normally stays low 90%'s. Pt recently completed antbx and prednisone taper

## 2016-12-08 NOTE — ED Notes (Signed)
Pt's 02 turned off for RA trial

## 2016-12-08 NOTE — ED Triage Notes (Signed)
Pt reports recent cough and cold symptoms, has been to pcp and treated for sinus infection. spo2 90% on room air at triage. Pt reports mild swelling to feet recently. Denies fever but also having headache and sore throat.

## 2016-12-08 NOTE — ED Provider Notes (Signed)
[facility] DEPT Provider Note   CSN: [id number] Arrival date & time: 12/08/16  1847    History   Chief Complaint Chief Complaint  Patient presents with  . Shortness of Breath    HPI ETHYL GAIN is a 61 y.o. female.  The history is provided by the patient.  Shortness of Breath  This is a recurrent problem. The average episode lasts 2 days. The problem occurs continuously.The current episode started yesterday. The problem has been gradually worsening. Associated symptoms include cough and leg swelling (intermittent, none currently). Pertinent negatives include no fever, no sore throat, no ear pain, no chest pain, no vomiting, no abdominal pain and no rash. Risk factors include smoking. She has tried inhaled steroids for the symptoms. The treatment provided no relief. Associated medical issues include COPD and CAD. Associated medical issues do not include PE or DVT.    Past Medical History:  Diagnosis Date  . Bronchitis   . CAD (coronary artery disease) 09/03/2013  . CHF (congestive heart failure) ([place])   . COPD (chronic obstructive pulmonary disease) ([place])   . Coronary artery disease   . Headache   . Heart disease   . Hyperlipidemia   . Hypertension   . Impaired glucose tolerance 08/20/2014  . Sinusitis     Patient Active Problem List   Diagnosis Date Noted  . Sinusitis, acute 04/08/2016  . Left lumbar radiculopathy 10/19/2015  . Cough 08/29/2015  . COPD exacerbation ([place]) 08/29/2015  . Pedal edema 02/22/2015  . Impaired glucose tolerance 08/20/2014  . Pruritus 07/08/2014  . Nocturnal hypoxemia 07/05/2014  . Esophagus, foreign body 06/12/2014  . Polycythemia, secondary 02/16/2014  . 5 mm Lung nodule, solitary 02/02/2014  . Acute respiratory failure with hypoxia ([place]) 02/01/2014  . GERD (gastroesophageal reflux disease) 02/01/2014  . Hypersomnolence 02/01/2014  . COPD with acute exacerbation ([place]) 02/01/2014  . Allergic rhinitis, cause unspecified  09/03/2013  . CAD (coronary artery disease) 09/03/2013  . Preventative health care 09/03/2013  . Essential hypertension 07/07/2013  . COPD (chronic obstructive pulmonary disease) with chronic bronchitis ([place]) 07/07/2013  . Status post coronary artery bypass grafting 07/07/2013  . Headache(784.0) 07/01/2013  . Hypertensive emergency 07/01/2013  . Polycythemia 07/01/2013    Past Surgical History:  Procedure Laterality Date  . CORONARY ARTERY BYPASS GRAFT  2003   Triple bypass  . ESOPHAGOGASTRODUODENOSCOPY Left 06/13/2014   Procedure: ESOPHAGOGASTRODUODENOSCOPY (EGD);  Surgeon: Juanita Craver, MD;  Location[facility] ENDOSCOPY;  Service: Endoscopy;  Laterality: Left;  . RIGID ESOPHAGOSCOPY N/A 06/12/2014   Procedure: RIGID ESOPHAGOSCOPY WITH FOREIGN BODY REMOVAL;  Surgeon: Ascencion Dike, MD;  Location: [facility] OR;  Service: ENT;  Laterality: N/A;    OB History    No data available       Home Medications    Prior to Admission medications   Medication Sig Start Date End Date Taking? Authorizing Provider  acetaminophen (TYLENOL) 500 MG tablet Take 1,000 mg by mouth at bedtime as needed for headache.   Yes Historical Provider, MD  albuterol (PROVENTIL HFA) 108 (90 Base) MCG/ACT inhaler INHALE 1-2 PUFFS INTO THE LUNGS EVERY 6 (SIX) HOURS AS NEEDED FOR WHEEZING OR SHORTNESS OF BREATH. 06/04/16  Yes Biagio Borg, MD  amLODipine (NORVASC) 5 MG tablet TAKE 1 TABLET BY MOUTH EVERY DAY 03/15/16  Yes Jerline Pain, MD  aspirin EC 81 MG tablet Take 1 tablet (81 mg total) by mouth daily. 07/02/13  Yes Olam Idler, MD  benzonatate (TESSALON) 100 MG capsule Take  1 capsule (100 mg total) by mouth 3 (three) times daily as needed for cough. 11/28/16  Yes Charlene Brooke Nche, NP  carvedilol (COREG) 12.5 MG tablet TAKE 1 TABLET (12.5 MG TOTAL) BY MOUTH 2 (TWO) TIMES DAILY. 03/14/16  Yes Jerline Pain, MD  cyclobenzaprine (FLEXERIL) 5 MG tablet Take 1 tablet (5 mg total) by mouth 3 (three) times daily as needed for muscle  spasms. 10/19/15  Yes Biagio Borg, MD  dextromethorphan-guaiFENesin [facility] DM) 30-600 MG 12hr tablet Take 1 tablet by mouth 2 (two) times daily as needed for cough. 11/28/16  Yes Charlene Brooke Nche, NP  diphenhydrAMINE (BENADRYL) 25 MG tablet Take 1 tablet (25 mg total) by mouth every 6 (six) hours as needed. Patient taking differently: Take 25 mg by mouth every 6 (six) hours as needed for allergies.  07/07/14  Yes Hoyt Koch, MD  fluticasone (FLONASE) 50 MCG/ACT nasal spray PLACE 2 SPRAYS INTO BOTH NOSTRILS DAILY. Patient taking differently: PLACE 2 SPRAYS INTO BOTH NOSTRILS DAILY AS NEEDED FOR ALLERGIES 09/22/14  Yes Biagio Borg, MD  hydrochlorothiazide (MICROZIDE) 12.5 MG capsule Take 12.5 mg by mouth daily as needed (fluid).    Yes Historical Provider, MD  HYDROcodone-acetaminophen (NORCO) 10-325 MG tablet Take 1 tablet by mouth every 6 (six) hours as needed. Patient taking differently: Take 1 tablet by mouth every 6 (six) hours as needed for moderate pain.  06/05/16  Yes Biagio Borg, MD  isosorbide mononitrate (IMDUR) 60 MG 24 hr tablet TAKE 1.5 TABLETS (90 MG TOTAL) BY MOUTH DAILY. 09/09/16  Yes Jerline Pain, MD  rosuvastatin (CRESTOR) 20 MG tablet Take 1 tablet (20 mg total) by mouth daily. 11/12/16  Yes Biagio Borg, MD  SPIRIVA HANDIHALER 18 MCG inhalation capsule PLACE 1 CAPSULE (18 MCG TOTAL) INTO INHALER AND INHALE DAILY. 02/14/16  Yes Biagio Borg, MD    Family History Family History  Problem Relation Age of Onset  . Lung cancer Paternal Uncle   . Heart disease Mother   . Brain cancer Mother   . Cancer Maternal Grandmother     colon  . Stroke Other   . Cancer      x 4 aunts  . Lung cancer Maternal Aunt   . Suicidality Brother     Social History Social History  Substance Use Topics  . Smoking status: Current Every Day Smoker    Packs/day: 0.50    Years: 40.00    Types: Cigarettes  . Smokeless tobacco: Never Used  . Alcohol use 3.0 - 3.6 oz/week    5 - 6  Glasses of wine per week     Allergies   Patient has no known allergies.   Review of Systems Review of Systems  Constitutional: Negative for chills and fever.  HENT: Positive for congestion. Negative for ear pain and sore throat.   Eyes: Negative for pain and visual disturbance.  Respiratory: Positive for cough and shortness of breath.   Cardiovascular: Positive for leg swelling (intermittent, none currently). Negative for chest pain and palpitations.  Gastrointestinal: Negative for abdominal pain and vomiting.  Genitourinary: Negative for dysuria and hematuria.  Musculoskeletal: Negative for arthralgias and back pain.  Skin: Negative for color change and rash.  Neurological: Negative for seizures and syncope.  All other systems reviewed and are negative.    Physical Exam Updated Vital Signs BP 124/67 (BP Location: Left Arm)   Pulse 67   Temp 97.4 F (36.3 C) (Oral)   Resp 18  Ht 5\' 1"  (1.549 m)   Wt 76.5 kg   SpO2 90%   BMI 31.88 kg/m   Physical Exam  Constitutional: She appears well-developed and well-nourished. No distress.  HENT:  Head: Normocephalic and atraumatic.  Eyes: Conjunctivae are normal.  Neck: Neck supple.  Cardiovascular: Normal rate, regular rhythm and normal heart sounds.   No murmur heard. Pulmonary/Chest: Effort normal. No respiratory distress. She has wheezes. She has no rales.  Tight lung sounds, occasional expiratory wheeze  Abdominal: Soft. There is no tenderness.  Musculoskeletal: She exhibits no edema, tenderness or deformity.  Neurological: She is alert.  Skin: Skin is warm and dry.  Psychiatric: She has a normal mood and affect.  Nursing note and vitals reviewed.    ED Treatments / Results  Labs (all labs ordered are listed, but only abnormal results are displayed) Labs Reviewed  BASIC METABOLIC PANEL - Abnormal; Notable for the following:       Result Value   Chloride 99 (*)    Glucose, Bld 118 (*)    All other components  within normal limits  CBC - Abnormal; Notable for the following:    RBC 6.15 (*)    Hemoglobin 17.8 (*)    HCT 54.8 (*)    All other components within normal limits  D-DIMER, QUANTITATIVE (NOT AT [facility]) - Abnormal; Notable for the following:    D-Dimer, Quant 0.84 (*)    All other components within normal limits  CBC - Abnormal; Notable for the following:    WBC 12.3 (*)    RBC 6.02 (*)    Hemoglobin 17.5 (*)    HCT 54.2 (*)    All other components within normal limits  BASIC METABOLIC PANEL - Abnormal; Notable for the following:    Chloride 99 (*)    Glucose, Bld 217 (*)    All other components within normal limits  CBC - Abnormal; Notable for the following:    RBC 6.02 (*)    Hemoglobin 17.5 (*)    HCT 54.0 (*)    All other components within normal limits  BRAIN NATRIURETIC PEPTIDE  CREATININE, SERUM  INFLUENZA PANEL BY PCR (TYPE A & B)  HIV ANTIBODY (ROUTINE TESTING)  I-STAT TROPOININ, ED    EKG  EKG Interpretation  Date/Time:  Sunday December 08 2016 18:53:24 EST Ventricular Rate:  77 PR Interval:  158 QRS Duration: 70 QT Interval:  378 QTC Calculation: 427 R Axis:   75 Text Interpretation:  Normal sinus rhythm Possible Left atrial enlargement Borderline ECG No significant change since last tracing Confirmed by [facility] MD, PEDRO ([id number]) on 12/08/2016 7:34:12 PM       Radiology Dg Chest 2 View  Result Date: 12/08/2016 CLINICAL DATA:  Acute onset of generalized chest congestion. Sinus infection. Shortness of breath. Initial encounter. EXAM: CHEST  2 VIEW COMPARISON:  Chest radiograph performed 08/30/2015 FINDINGS: The lungs are well-aerated. Mild chronically increased interstitial markings are again noted. There is no evidence of pleural effusion or pneumothorax. The heart is normal in size. The patient is status post median sternotomy, with evidence of prior CABG. Vascular congestion is seen. No acute osseous abnormalities are seen. IMPRESSION: Vascular congestion  noted. Mild chronically increased interstitial markings again noted. Electronically Signed   By: Garald Balding M.D.   On: 12/08/2016 19:43   Ct Angio Chest Pe W Or Wo Contrast  Result Date: 12/09/2016 CLINICAL DATA:  Shortness of breath and hypoxia. EXAM: CT ANGIOGRAPHY CHEST WITH CONTRAST TECHNIQUE: Multidetector CT  imaging of the chest was performed using the standard protocol during bolus administration of intravenous contrast. Multiplanar CT image reconstructions and MIPs were obtained to evaluate the vascular anatomy. CONTRAST:  80 mL Isovue 370 COMPARISON:  06/13/2014 FINDINGS: Cardiovascular: Satisfactory opacification of pulmonary arteries noted, and no pulmonary emboli identified. No evidence of thoracic aortic dissection or aneurysm. Aortic atherosclerosis. Prior CABG. Mediastinum/Nodes: No masses or pathologically enlarged lymph nodes identified. Lungs/Pleura: No pulmonary mass, infiltrate, or effusion. Stable mild pleural-parenchymal scarring in lateral right lower lobe. Upper abdomen: No acute findings. Musculoskeletal: No suspicious bone lesions or other significant abnormality identified. Review of the MIP images confirms the above findings. IMPRESSION: No radiographic evidence of pulmonary embolism or other active disease. Aortic atherosclerosis. Electronically Signed   By: Earle Gell M.D.   On: 12/09/2016 11:54    Procedures Procedures (including critical care time)  Medications Ordered in ED Medications  ipratropium-albuterol (DUONEB) 0.5-2.5 (3) MG/3ML nebulizer solution 3 mL (3 mLs Nebulization Given 12/08/16 1938)  ipratropium (ATROVENT) 0.02 % nebulizer solution (  Canceled Entry 12/08/16 2026)  albuterol (PROVENTIL, VENTOLIN) (5 MG/ML) 0.5% continuous inhalation solution (  Canceled Entry 12/08/16 2026)  benzonatate (TESSALON) capsule 100 mg (not administered)  dextromethorphan-guaiFENesin (MUCINEX DM) 30-600 MG per 12 hr tablet 1 tablet (not administered)  rosuvastatin  (CRESTOR) tablet 20 mg (not administered)  isosorbide mononitrate (IMDUR) 24 hr tablet 90 mg (not administered)  HYDROcodone-acetaminophen (NORCO) 10-325 MG per tablet 1 tablet (not administered)  amLODipine (NORVASC) tablet 5 mg (not administered)  carvedilol (COREG) tablet 12.5 mg (12.5 mg Oral Given 12/09/16 0858)  cyclobenzaprine (FLEXERIL) tablet 5 mg (not administered)  fluticasone (FLONASE) 50 MCG/ACT nasal spray 2 spray (not administered)  aspirin EC tablet 81 mg (not administered)  methylPREDNISolone sodium succinate (SOLU-MEDROL) 40 mg/mL injection 40 mg (40 mg Intravenous Given 12/09/16 0211)  acetaminophen (TYLENOL) tablet 650 mg (not administered)    Or  acetaminophen (TYLENOL) suppository 650 mg (not administered)  ondansetron (ZOFRAN) tablet 4 mg (not administered)    Or  ondansetron (ZOFRAN) injection 4 mg (not administered)  enoxaparin (LOVENOX) injection 40 mg (40 mg Subcutaneous Given 12/09/16 0859)  albuterol (PROVENTIL) (2.5 MG/3ML) 0.083% nebulizer solution 2.5 mg (not administered)  ipratropium-albuterol (DUONEB) 0.5-2.5 (3) MG/3ML nebulizer solution 3 mL (not administered)  iopamidol (ISOVUE-370) 76 % injection (not administered)  doxycycline (VIBRA-TABS) tablet 100 mg (not administered)  predniSONE (DELTASONE) tablet 60 mg (60 mg Oral Given 12/08/16 2001)  ipratropium (ATROVENT) nebulizer solution 0.5 mg (0.5 mg Nebulization Given 12/08/16 2027)  iopamidol (ISOVUE-370) 76 % injection (100 mLs  Contrast Given 12/09/16 1119)     Initial Impression / Assessment and Plan / ED Course  I have reviewed the triage vital signs and the nursing notes.  Pertinent labs & imaging results that were available during my care of the patient were reviewed by me and considered in my medical decision making (see chart for details).    Pt with hx as above p/w progressive SOB over the last 2 days. She was seen by PCP about 10 days ago and treated for COPD exacerbation with single dose of  Solumedrol and 1 week of Levaquin. Her sx initially improved but she is again having productive cough and dyspnea on exertion. Of note, she ran out of her Proair about a week ago. No fevers, chills, or chest pain. She does endorse occasional leg swelling bilaterally, but none today. Pt initially 90% on Ra. She was placed on O2 but I removed to see if  she could maintain > 88%. DuoNeb given. Pt desatted on RA to the mid 80s with increased dyspnea. She was given 1 hour continuous DuoNeb but continued to have oxygen req't which is new for her. Prednisone given. CXR shows no pneumonia. Pt stable on 2 l/min O2. Admitted to hospitalist for treatment of COPD exacerbation.   Final Clinical Impressions(s) / ED Diagnoses   Final diagnoses:  SOB (shortness of breath)    New Prescriptions Current Discharge Medication List       Clifton James, MD 12/09/16 [place], MD 12/10/16 [phone]

## 2016-12-08 NOTE — ED Notes (Signed)
Pt placed back on [place] 

## 2016-12-09 ENCOUNTER — Encounter (HOSPITAL_COMMUNITY): Payer: Self-pay | Admitting: *Deleted

## 2016-12-09 ENCOUNTER — Inpatient Hospital Stay (HOSPITAL_COMMUNITY): Payer: Commercial Managed Care - HMO

## 2016-12-09 DIAGNOSIS — J441 Chronic obstructive pulmonary disease with (acute) exacerbation: Principal | ICD-10-CM

## 2016-12-09 DIAGNOSIS — J9601 Acute respiratory failure with hypoxia: Secondary | ICD-10-CM

## 2016-12-09 DIAGNOSIS — I10 Essential (primary) hypertension: Secondary | ICD-10-CM

## 2016-12-09 DIAGNOSIS — E785 Hyperlipidemia, unspecified: Secondary | ICD-10-CM

## 2016-12-09 LAB — BASIC METABOLIC PANEL
Anion gap: 8 (ref 5–15)
BUN: 12 mg/dL (ref 6–20)
CALCIUM: 9.3 mg/dL (ref 8.9–10.3)
CHLORIDE: 99 mmol/L — AB (ref 101–111)
CO2: 31 mmol/L (ref 22–32)
CREATININE: 0.46 mg/dL (ref 0.44–1.00)
GFR calc Af Amer: >60 mL/min (ref >60)
GFR calc non Af Amer: >60 mL/min (ref >60)
Glucose, Bld: 217 mg/dL — ABNORMAL HIGH (ref 65–99)
Potassium: 4.9 mmol/L (ref 3.5–5.1)
SODIUM: 138 mmol/L (ref 135–145)

## 2016-12-09 LAB — D-DIMER, QUANTITATIVE (NOT AT ARMC): D DIMER QUANT: 0.84 ug{FEU}/mL — AB (ref 0.00–0.50)

## 2016-12-09 LAB — HIV ANTIBODY (ROUTINE TESTING W REFLEX): HIV SCREEN 4TH GENERATION: NONREACTIVE

## 2016-12-09 LAB — INFLUENZA PANEL BY PCR (TYPE A & B)
Influenza A By PCR: NEGATIVE
Influenza B By PCR: NEGATIVE

## 2016-12-09 LAB — CBC
HCT: 54 % — ABNORMAL HIGH (ref 36.0–46.0)
HCT: 54.2 % — ABNORMAL HIGH (ref 36.0–46.0)
Hemoglobin: 17.5 g/dL — ABNORMAL HIGH (ref 12.0–15.0)
Hemoglobin: 17.5 g/dL — ABNORMAL HIGH (ref 12.0–15.0)
MCH: 29.1 pg (ref 26.0–34.0)
MCH: 29.1 pg (ref 26.0–34.0)
MCHC: 32.3 g/dL (ref 30.0–36.0)
MCHC: 32.4 g/dL (ref 30.0–36.0)
MCV: 89.7 fL (ref 78.0–100.0)
MCV: 90 fL (ref 78.0–100.0)
PLATELETS: 177 10*3/uL (ref 150–400)
PLATELETS: 178 10*3/uL (ref 150–400)
RBC: 6.02 MIL/uL — AB (ref 3.87–5.11)
RBC: 6.02 MIL/uL — ABNORMAL HIGH (ref 3.87–5.11)
RDW: 13.8 % (ref 11.5–15.5)
RDW: 13.9 % (ref 11.5–15.5)
WBC: 10.1 10*3/uL (ref 4.0–10.5)
WBC: 12.3 10*3/uL — AB (ref 4.0–10.5)

## 2016-12-09 LAB — CREATININE, SERUM: CREATININE: 0.58 mg/dL (ref 0.44–1.00)

## 2016-12-09 LAB — BRAIN NATRIURETIC PEPTIDE: B NATRIURETIC PEPTIDE 5: 65.1 pg/mL (ref 0.0–100.0)

## 2016-12-09 MED ORDER — DOXYCYCLINE HYCLATE 100 MG PO TABS
100.0000 mg | ORAL_TABLET | Freq: Two times a day (BID) | ORAL | Status: DC
  Administered 2016-12-09 – 2016-12-10 (×3): 100 mg via ORAL
  Filled 2016-12-09 (×3): qty 1

## 2016-12-09 MED ORDER — ZOLPIDEM TARTRATE 5 MG PO TABS
5.0000 mg | ORAL_TABLET | Freq: Every evening | ORAL | Status: DC | PRN
  Administered 2016-12-09: 5 mg via ORAL
  Filled 2016-12-09: qty 1

## 2016-12-09 MED ORDER — IOPAMIDOL (ISOVUE-370) INJECTION 76%
INTRAVENOUS | Status: AC
  Filled 2016-12-09: qty 100

## 2016-12-09 MED ORDER — IPRATROPIUM-ALBUTEROL 0.5-2.5 (3) MG/3ML IN SOLN
3.0000 mL | Freq: Three times a day (TID) | RESPIRATORY_TRACT | Status: DC
  Administered 2016-12-09 – 2016-12-10 (×3): 3 mL via RESPIRATORY_TRACT
  Filled 2016-12-09 (×3): qty 3

## 2016-12-09 MED ORDER — IPRATROPIUM-ALBUTEROL 0.5-2.5 (3) MG/3ML IN SOLN
3.0000 mL | Freq: Four times a day (QID) | RESPIRATORY_TRACT | Status: DC
  Filled 2016-12-09: qty 3

## 2016-12-09 MED ORDER — IPRATROPIUM-ALBUTEROL 0.5-2.5 (3) MG/3ML IN SOLN
3.0000 mL | Freq: Four times a day (QID) | RESPIRATORY_TRACT | Status: DC
  Administered 2016-12-09 (×2): 3 mL via RESPIRATORY_TRACT
  Filled 2016-12-09 (×2): qty 3

## 2016-12-09 MED ORDER — IOPAMIDOL (ISOVUE-370) INJECTION 76%
INTRAVENOUS | Status: AC
  Administered 2016-12-09: 100 mL
  Filled 2016-12-09: qty 100

## 2016-12-09 MED ORDER — IPRATROPIUM-ALBUTEROL 0.5-2.5 (3) MG/3ML IN SOLN
3.0000 mL | RESPIRATORY_TRACT | Status: DC
  Administered 2016-12-09: 3 mL via RESPIRATORY_TRACT
  Filled 2016-12-09: qty 3

## 2016-12-09 NOTE — Progress Notes (Signed)
PROGRESS NOTE  KARLIEE ALTIZER  [id number] DOB: 1956-02-06 DOA: 12/08/2016 PCP: Cathlean Cower, MD   Brief Narrative: ARCHISHA ESKOLA is a 61 y.o. female with a history of COPD, HTN, and HLD who presented with complaints of worsening shortness of breath. Patient had gone to her PCP on 11/28/2016 and was given steroids and Levaquin despite taking which patient was still short of breath. In the ED she had a new oxygen requirement and remained not at baseline despite nebulizer and steroid treatments. There were no acute infiltrates on CXR. Since admission she has not improved very much.   Assessment & Plan: Principal Problem:   Acute respiratory failure with hypoxia (HCC) Active Problems:   Essential hypertension   COPD exacerbation (HCC)  Acute respiratory failure with hypoxia secondary to COPD exacerbation: Patient still not at baseline, still with wheezing. +DDimer, and subsequent CTA showed no PE or infiltrate. Flu negative. - Continue IV steroids - Continue scheduled and prn duonebs - Continue pulmicort - Continue doxycycline  Hypertension:  - Continue amlodipine, coreg, imdur, HCTZ  Hyperlipidemia: - Continue statin  Tobacco use:  - Brief cessation counseling provided  Polycythemia: Likely reactive from COPD. - Follow as outpatient.  DVT prophylaxis: Lovenox Code Status: Full Family Communication: None at bedside this AM Disposition Plan: Continue inpatient management of acute hypoxemic respiratory failure due to COPD exacerbation still worse than baseline.  Consultants:   None  Procedures:   None  Antimicrobials:  Doxycycline 2/18 >>    Subjective: Pt without significant improvement overnight since admission late last night. Still wheezing and more short of breath than baseline. No chest pain or palpitations or leg swelling.   Objective: Vitals:   12/09/16 0912 12/09/16 1207 12/09/16 1214 12/09/16 1454  BP:   (!) 119/54   Pulse:      Resp:      Temp:       TempSrc:      SpO2: 90% 92%  92%  Weight:      Height:        Intake/Output Summary (Last 24 hours) at 12/09/16 1658 Last data filed at 12/09/16 1315  Gross per 24 hour  Intake              750 ml  Output             1450 ml  Net             -700 ml   Filed Weights   12/08/16 1857 12/09/16 0110  Weight: 77 kg (169 lb 11.2 oz) 76.5 kg (168 lb 11.2 oz)    Examination: General exam: 61 y.o. female in no distress Respiratory system: Labored breathing with any conversation with diffuse expiratory wheezing several hours out from last breathing treatment.  Cardiovascular system: Regular rate and rhythm. No murmur, rub, or gallop. No JVD, and no pedal edema. Gastrointestinal system: Abdomen soft, non-tender, non-distended, with normoactive bowel sounds. No organomegaly or masses felt. Central nervous system: Alert and oriented. No focal neurological deficits. Extremities: Warm, no deformities Skin: No rashes, lesions no ulcers Psychiatry: Judgement and insight appear normal. Mood & affect appropriate.   Data Reviewed: I have personally reviewed following labs and imaging studies  CBC:  Recent Labs Lab 12/08/16 1932 12/08/16 2350 12/09/16 0446  WBC 10.4 12.3* 10.1  HGB 17.8* 17.5* 17.5*  HCT 54.8* 54.2* 54.0*  MCV 89.1 90.0 89.7  PLT 182 177 0000000   Basic Metabolic Panel:  Recent Labs Lab 12/08/16 1932 12/08/16 2350  12/09/16 0446  NA 139  --  138  K 4.3  --  4.9  CL 99*  --  99*  CO2 28  --  31  GLUCOSE 118*  --  217*  BUN 8  --  12  CREATININE 0.71 0.58 0.46  CALCIUM 9.4  --  9.3   GFR: Estimated Creatinine Clearance: 69.1 mL/min (by C-G formula based on SCr of 0.46 mg/dL). Liver Function Tests: No results for input(s): AST, ALT, ALKPHOS, BILITOT, PROT, ALBUMIN in the last 168 hours. No results for input(s): LIPASE, AMYLASE in the last 168 hours. No results for input(s): AMMONIA in the last 168 hours. Coagulation Profile: No results for input(s): INR, PROTIME  in the last 168 hours. Cardiac Enzymes: No results for input(s): CKTOTAL, CKMB, CKMBINDEX, TROPONINI in the last 168 hours. BNP (last 3 results) No results for input(s): PROBNP in the last 8760 hours. HbA1C: No results for input(s): HGBA1C in the last 72 hours. CBG: No results for input(s): GLUCAP in the last 168 hours. Lipid Profile: No results for input(s): CHOL, HDL, LDLCALC, TRIG, CHOLHDL, LDLDIRECT in the last 72 hours. Thyroid Function Tests: No results for input(s): TSH, T4TOTAL, FREET4, T3FREE, THYROIDAB in the last 72 hours. Anemia Panel: No results for input(s): VITAMINB12, FOLATE, FERRITIN, TIBC, IRON, RETICCTPCT in the last 72 hours. Urine analysis:    Component Value Date/Time   COLORURINE YELLOW 10/19/2015 1018   APPEARANCEUR CLEAR 10/19/2015 1018   LABSPEC 1.025 10/19/2015 1018   PHURINE 5.5 10/19/2015 1018   GLUCOSEU NEGATIVE 10/19/2015 1018   HGBUR NEGATIVE 10/19/2015 1018   BILIRUBINUR NEGATIVE 10/19/2015 1018   KETONESUR NEGATIVE 10/19/2015 1018   PROTEINUR NEGATIVE 02/01/2014 2131   UROBILINOGEN 0.2 10/19/2015 1018   NITRITE NEGATIVE 10/19/2015 1018   LEUKOCYTESUR NEGATIVE 10/19/2015 1018   No results found for this or any previous visit (from the past 240 hour(s)).    Radiology Studies: Dg Chest 2 View  Result Date: 12/08/2016 CLINICAL DATA:  Acute onset of generalized chest congestion. Sinus infection. Shortness of breath. Initial encounter. EXAM: CHEST  2 VIEW COMPARISON:  Chest radiograph performed 08/30/2015 FINDINGS: The lungs are well-aerated. Mild chronically increased interstitial markings are again noted. There is no evidence of pleural effusion or pneumothorax. The heart is normal in size. The patient is status post median sternotomy, with evidence of prior CABG. Vascular congestion is seen. No acute osseous abnormalities are seen. IMPRESSION: Vascular congestion noted. Mild chronically increased interstitial markings again noted. Electronically  Signed   By: Garald Balding M.D.   On: 12/08/2016 19:43   Ct Angio Chest Pe W Or Wo Contrast  Result Date: 12/09/2016 CLINICAL DATA:  Shortness of breath and hypoxia. EXAM: CT ANGIOGRAPHY CHEST WITH CONTRAST TECHNIQUE: Multidetector CT imaging of the chest was performed using the standard protocol during bolus administration of intravenous contrast. Multiplanar CT image reconstructions and MIPs were obtained to evaluate the vascular anatomy. CONTRAST:  80 mL Isovue 370 COMPARISON:  06/13/2014 FINDINGS: Cardiovascular: Satisfactory opacification of pulmonary arteries noted, and no pulmonary emboli identified. No evidence of thoracic aortic dissection or aneurysm. Aortic atherosclerosis. Prior CABG. Mediastinum/Nodes: No masses or pathologically enlarged lymph nodes identified. Lungs/Pleura: No pulmonary mass, infiltrate, or effusion. Stable mild pleural-parenchymal scarring in lateral right lower lobe. Upper abdomen: No acute findings. Musculoskeletal: No suspicious bone lesions or other significant abnormality identified. Review of the MIP images confirms the above findings. IMPRESSION: No radiographic evidence of pulmonary embolism or other active disease. Aortic atherosclerosis. Electronically Signed  By: Earle Gell M.D.   On: 12/09/2016 11:54    Scheduled Meds: . amLODipine  5 mg Oral Daily  . aspirin EC  81 mg Oral Daily  . carvedilol  12.5 mg Oral BID WC  . doxycycline  100 mg Oral Q12H  . enoxaparin (LOVENOX) injection  40 mg Subcutaneous Q24H  . fluticasone  2 spray Each Nare Daily  . iopamidol      . ipratropium-albuterol  3 mL Nebulization TID  . isosorbide mononitrate  90 mg Oral Daily  . methylPREDNISolone (SOLU-MEDROL) injection  40 mg Intravenous Q8H  . rosuvastatin  20 mg Oral q1800   Continuous Infusions:   LOS: 1 day   Time spent: 25 minutes.  Vance Gather, MD [facility] Pager [phone]  If 7PM-7AM, please contact night-coverage [email or web address] Password  [facility] 12/09/2016, 4:58 PM

## 2016-12-09 NOTE — Progress Notes (Signed)
Patients flu is negative. MD notified. Ordered to D/C droplet precautions. Orders followed. Will continue to monitor.

## 2016-12-09 NOTE — Progress Notes (Signed)
Patients B/P is 119/54. MD notified. Ordered to hold Norvasc. Will continue to monitor.

## 2016-12-09 NOTE — Progress Notes (Signed)
Inpatient Diabetes Program Recommendations  AACE/ADA: New Consensus Statement on Inpatient Glycemic Control (2015)  Target Ranges:  Prepandial:   less than 140 mg/dL      Peak postprandial:   less than 180 mg/dL (1-2 hours)      Critically ill patients:  140 - 180 mg/dL   Lab Results  Component Value Date   GLUCAP 154 (H) 06/14/2014   HGBA1C 6.3 10/19/2015   Results for BETHENY, CRAHAN (MRN [id number]) as of 12/09/2016 13:18  Ref. Range 12/09/2016 04:46  Glucose Latest Ref Range: 65 - 99 mg/dL 217 (H)   Review of Glycemic Control  Diabetes history: pre-diabetes noted  Current orders for Inpatient glycemic control: none  Inpatient Diabetes Program Recommendations:  Per ADA recommendations "consider performing an A1C on all patients with diabetes or hyperglycemia admitted to the hospital if not performed in the prior 3 months".  Thank you,  Windy Carina, RN, MSN Diabetes Coordinator Inpatient Diabetes Program [phone] (Team Pager)

## 2016-12-09 NOTE — Progress Notes (Signed)
B/P is 107/54. MD notified. Ordered to hold coreg.

## 2016-12-10 LAB — BASIC METABOLIC PANEL
Anion gap: 8 (ref 5–15)
BUN: 9 mg/dL (ref 6–20)
CHLORIDE: 101 mmol/L (ref 101–111)
CO2: 31 mmol/L (ref 22–32)
CREATININE: 0.4 mg/dL — AB (ref 0.44–1.00)
Calcium: 9.6 mg/dL (ref 8.9–10.3)
GFR calc Af Amer: >60 mL/min (ref >60)
GFR calc non Af Amer: >60 mL/min (ref >60)
GLUCOSE: 148 mg/dL — AB (ref 65–99)
POTASSIUM: 4.5 mmol/L (ref 3.5–5.1)
Sodium: 140 mmol/L (ref 135–145)

## 2016-12-10 MED ORDER — DOXYCYCLINE HYCLATE 100 MG PO TABS: 100.0000 mg | ORAL_TABLET | Freq: Two times a day (BID) | ORAL | 0 refills | Status: DC

## 2016-12-10 MED ORDER — PREDNISONE 20 MG PO TABS: 40.0000 mg | ORAL_TABLET | Freq: Every day | ORAL | 0 refills | Status: DC

## 2016-12-10 MED ORDER — ALBUTEROL SULFATE HFA 108 (90 BASE) MCG/ACT IN AERS: INHALATION_SPRAY | RESPIRATORY_TRACT | 0 refills | Status: DC

## 2016-12-10 NOTE — Discharge Summary (Signed)
Physician Discharge Summary  NAOKO COPPA [id number] DOB: [birth year] DOA: 12/08/2016  PCP: Cathlean Cower, MD  Admit date: 12/08/2016 Discharge date: 12/10/2016  Admitted From: Home Disposition: Home   Recommendations for Outpatient Follow-up:  1. Follow up with PCP in 1-2 weeks 2. Ongoing smoking cessation counseling recommended  Home Health: None Equipment/Devices: None Discharge Condition: Stable CODE STATUS: Full Diet recommendation: Heart healthy  Brief/Interim Summary: Rosellen Hillestad Burgessis a 61 y.o.femalewith a history of COPD, HTN, and HLD who presented with complaints of worsening shortness of breath. Patient had gone to her PCP on 11/28/2016 and was given steroids and Levaquin despite taking which patient was still short of breath. In the ED she had a new oxygen requirement and ongoing dyspnea despite nebulizer and steroid treatments, so she was admitted. There were no acute infiltrates on CXR. Since admission, she has shown significant improvement. She has ambulated in the hallways without dyspnea, remaining > 92% SpO2, and eating normally.   Discharge Diagnoses:  Principal Problem:   Acute respiratory failure with hypoxia (HCC) Active Problems:   Essential hypertension   COPD exacerbation (HCC)  Acute respiratory failure with hypoxia secondary to COPD exacerbation: Respiratory failure resolved. Patient has returned to baseline and wheezing has resolved. Had +DDimer, and subsequent CTA showed no PE or infiltrate. Flu negative. - Transition to PO steroids to finish 5 day course - Continue scheduled albuterol and spiriva at home - Continue doxycycline for 7 days  Hypertension:  - Continue amlodipine, coreg, imdur, HCTZ  Hyperlipidemia: - Continue statin  Tobacco use:  - Brief cessation counseling provided  Polycythemia: Likely reactive from COPD. - Follow as outpatient.  Discharge Instructions Discharge Instructions    Discharge instructions    Complete  by:  As directed    You were admitted for an acute COPD exacerbation and have improved with therapy. You do not need supplemental oxygen. You are stable for discharge with the following recommendations:  - Keep taking steroids: Start taking prednisone 40mg  (2 tablets) daily tomorrow morning. - Keep taking doxycycline twice daily until you run out of medication in 5 days.  - Keep taking spiriva as directed and albuterol as needed. This has been sent to your pharmacy as well.  - If your symptoms return, seek medical attention right away.     Allergies as of 12/10/2016   No Known Allergies     Medication List    TAKE these medications   acetaminophen 500 MG tablet Commonly known as:  TYLENOL Take 1,000 mg by mouth at bedtime as needed for headache.   albuterol 108 (90 Base) MCG/ACT inhaler Commonly known as:  PROVENTIL HFA INHALE 1-2 PUFFS INTO THE LUNGS EVERY 6 (SIX) HOURS AS NEEDED FOR WHEEZING OR SHORTNESS OF BREATH.   amLODipine 5 MG tablet Commonly known as:  NORVASC TAKE 1 TABLET BY MOUTH EVERY DAY   aspirin EC 81 MG tablet Take 1 tablet (81 mg total) by mouth daily.   benzonatate 100 MG capsule Commonly known as:  TESSALON Take 1 capsule (100 mg total) by mouth 3 (three) times daily as needed for cough.   carvedilol 12.5 MG tablet Commonly known as:  COREG TAKE 1 TABLET (12.5 MG TOTAL) BY MOUTH 2 (TWO) TIMES DAILY.   cyclobenzaprine 5 MG tablet Commonly known as:  FLEXERIL Take 1 tablet (5 mg total) by mouth 3 (three) times daily as needed for muscle spasms.   dextromethorphan-guaiFENesin 30-600 MG 12hr tablet Commonly known as:  MUCINEX DM Take 1 tablet by  mouth 2 (two) times daily as needed for cough.   diphenhydrAMINE 25 MG tablet Commonly known as:  BENADRYL Take 1 tablet (25 mg total) by mouth every 6 (six) hours as needed. What changed:  reasons to take this   doxycycline 100 MG tablet Commonly known as:  VIBRA-TABS Take 1 tablet (100 mg total) by mouth  every 12 (twelve) hours.   fluticasone 50 MCG/ACT nasal spray Commonly known as:  FLONASE PLACE 2 SPRAYS INTO BOTH NOSTRILS DAILY. What changed:  See the new instructions.   hydrochlorothiazide 12.5 MG capsule Commonly known as:  MICROZIDE Take 12.5 mg by mouth daily as needed (fluid).   HYDROcodone-acetaminophen 10-325 MG tablet Commonly known as:  NORCO Take 1 tablet by mouth every 6 (six) hours as needed. What changed:  reasons to take this   isosorbide mononitrate 60 MG 24 hr tablet Commonly known as:  IMDUR TAKE 1.5 TABLETS (90 MG TOTAL) BY MOUTH DAILY.   predniSONE 20 MG tablet Commonly known as:  DELTASONE Take 2 tablets (40 mg total) by mouth daily with breakfast. Start taking on:  12/11/2016   rosuvastatin 20 MG tablet Commonly known as:  CRESTOR Take 1 tablet (20 mg total) by mouth daily.   SPIRIVA HANDIHALER 18 MCG inhalation capsule Generic drug:  tiotropium PLACE 1 CAPSULE (18 MCG TOTAL) INTO INHALER AND INHALE DAILY.      Follow-up Information    Cathlean Cower, MD. Schedule an appointment as soon as possible for a visit in 1 week(s).   Specialties:  Internal Medicine, Radiology Contact information: [address] [place] [address] [phone]          No Known Allergies  Consultations:  None  Procedures/Studies: Dg Chest 2 View  Result Date: 12/08/2016 CLINICAL DATA:  Acute onset of generalized chest congestion. Sinus infection. Shortness of breath. Initial encounter. EXAM: CHEST  2 VIEW COMPARISON:  Chest radiograph performed 08/30/2015 FINDINGS: The lungs are well-aerated. Mild chronically increased interstitial markings are again noted. There is no evidence of pleural effusion or pneumothorax. The heart is normal in size. The patient is status post median sternotomy, with evidence of prior CABG. Vascular congestion is seen. No acute osseous abnormalities are seen. IMPRESSION: Vascular congestion noted. Mild chronically increased  interstitial markings again noted. Electronically Signed   By: Garald Balding M.D.   On: 12/08/2016 19:43   Ct Angio Chest Pe W Or Wo Contrast  Result Date: 12/09/2016 CLINICAL DATA:  Shortness of breath and hypoxia. EXAM: CT ANGIOGRAPHY CHEST WITH CONTRAST TECHNIQUE: Multidetector CT imaging of the chest was performed using the standard protocol during bolus administration of intravenous contrast. Multiplanar CT image reconstructions and MIPs were obtained to evaluate the vascular anatomy. CONTRAST:  80 mL Isovue 370 COMPARISON:  06/13/2014 FINDINGS: Cardiovascular: Satisfactory opacification of pulmonary arteries noted, and no pulmonary emboli identified. No evidence of thoracic aortic dissection or aneurysm. Aortic atherosclerosis. Prior CABG. Mediastinum/Nodes: No masses or pathologically enlarged lymph nodes identified. Lungs/Pleura: No pulmonary mass, infiltrate, or effusion. Stable mild pleural-parenchymal scarring in lateral right lower lobe. Upper abdomen: No acute findings. Musculoskeletal: No suspicious bone lesions or other significant abnormality identified. Review of the MIP images confirms the above findings. IMPRESSION: No radiographic evidence of pulmonary embolism or other active disease. Aortic atherosclerosis. Electronically Signed   By: Earle Gell M.D.   On: 12/09/2016 11:54   Subjective: Patient with improved breathing, slept well and ate full breakfast and lunch. No chest pain.   Discharge Exam: Vitals:  12/10/16 0441 12/10/16 0944  BP: (!) 104/48 (!) 118/55  Pulse: 62 63  Resp: 17 16  Temp: 98.2 F (36.8 C) 97.5 F (36.4 C)   General: 61yo F ambulating after shower in no distress Cardiovascular: RRR, S1/S2 +, no rubs, no gallops Respiratory: Nonlabored, 96% on room air for the past 15 minutes. CTA bilaterally, no wheezing, no rhonchi Abdominal: Soft, NT, ND, bowel sounds + Extremities: No edema, no cyanosis  The results of significant diagnostics from this  hospitalization (including imaging, microbiology, ancillary and laboratory) are listed below for reference.    Labs: BNP (last 3 results)  Recent Labs  12/08/16 2350  BNP 0000000   Basic Metabolic Panel:  Recent Labs Lab 12/08/16 1932 12/08/16 2350 12/09/16 0446 12/10/16 0746  NA 139  --  138 140  K 4.3  --  4.9 4.5  CL 99*  --  99* 101  CO2 28  --  31 31  GLUCOSE 118*  --  217* 148*  BUN 8  --  12 9  CREATININE 0.71 0.58 0.46 0.40*  CALCIUM 9.4  --  9.3 9.6   CBC:  Recent Labs Lab 12/08/16 1932 12/08/16 2350 12/09/16 0446  WBC 10.4 12.3* 10.1  HGB 17.8* 17.5* 17.5*  HCT 54.8* 54.2* 54.0*  MCV 89.1 90.0 89.7  PLT 182 177 178    Recent Labs  12/08/16 2350  DDIMER 0.84*   Urinalysis    Component Value Date/Time   COLORURINE YELLOW 10/19/2015 1018   APPEARANCEUR CLEAR 10/19/2015 1018   LABSPEC 1.025 10/19/2015 1018   PHURINE 5.5 10/19/2015 1018   GLUCOSEU NEGATIVE 10/19/2015 1018   HGBUR NEGATIVE 10/19/2015 1018   BILIRUBINUR NEGATIVE 10/19/2015 1018   KETONESUR NEGATIVE 10/19/2015 1018   PROTEINUR NEGATIVE 02/01/2014 2131   UROBILINOGEN 0.2 10/19/2015 1018   NITRITE NEGATIVE 10/19/2015 1018   Sharon 10/19/2015 1018   Time coordinating discharge: Approximately 40 minutes  Vance Gather, MD  [facility] 12/10/2016, 2:20 PM Pager [phone]  If 7PM-7AM, please contact night-coverage [email or web address] Password [email or web address]

## 2016-12-10 NOTE — Progress Notes (Signed)
Discharge instructions given. Pt verbalized understanding and all questions were answered.  

## 2016-12-11 LAB — HEMOGLOBIN A1C
HEMOGLOBIN A1C: 6.1 % — AB (ref 4.8–5.6)
Mean Plasma Glucose: 128

## 2016-12-18 ENCOUNTER — Inpatient Hospital Stay: Payer: 59 | Admitting: Internal Medicine

## 2017-01-29 ENCOUNTER — Encounter: Payer: Self-pay | Admitting: Cardiology

## 2017-02-03 ENCOUNTER — Other Ambulatory Visit (HOSPITAL_COMMUNITY): Payer: Self-pay | Admitting: Family Medicine

## 2017-02-04 ENCOUNTER — Encounter: Payer: Self-pay | Admitting: Internal Medicine

## 2017-02-04 ENCOUNTER — Ambulatory Visit (INDEPENDENT_AMBULATORY_CARE_PROVIDER_SITE_OTHER): Payer: Commercial Managed Care - HMO | Admitting: Internal Medicine

## 2017-02-04 VITALS — BP 134/84 | HR 84 | Ht 60.0 in | Wt 169.0 lb

## 2017-02-04 DIAGNOSIS — R05 Cough: Secondary | ICD-10-CM

## 2017-02-04 DIAGNOSIS — J441 Chronic obstructive pulmonary disease with (acute) exacerbation: Secondary | ICD-10-CM | POA: Diagnosis not present

## 2017-02-04 DIAGNOSIS — R059 Cough, unspecified: Secondary | ICD-10-CM

## 2017-02-04 DIAGNOSIS — I10 Essential (primary) hypertension: Secondary | ICD-10-CM | POA: Diagnosis not present

## 2017-02-04 MED ORDER — LEVOFLOXACIN 500 MG PO TABS: 500.0000 mg | ORAL_TABLET | Freq: Every day | ORAL | 0 refills | Status: DC

## 2017-02-04 MED ORDER — PREDNISONE 10 MG PO TABS: ORAL_TABLET | ORAL | 0 refills | Status: DC

## 2017-02-04 MED ORDER — METHYLPREDNISOLONE ACETATE 80 MG/ML IJ SUSP
80.0000 mg | Freq: Once | INTRAMUSCULAR | Status: AC
  Administered 2017-02-04: 80 mg via INTRAMUSCULAR

## 2017-02-04 MED ORDER — ALBUTEROL SULFATE HFA 108 (90 BASE) MCG/ACT IN AERS: INHALATION_SPRAY | RESPIRATORY_TRACT | 5 refills | Status: DC

## 2017-02-04 MED ORDER — ALBUTEROL SULFATE (2.5 MG/3ML) 0.083% IN NEBU
2.5000 mg | INHALATION_SOLUTION | Freq: Once | RESPIRATORY_TRACT | Status: AC
  Administered 2017-02-04: 2.5 mg via RESPIRATORY_TRACT

## 2017-02-04 MED ORDER — HYDROCODONE-HOMATROPINE 5-1.5 MG/5ML PO SYRP: 5.0000 mL | ORAL_SOLUTION | Freq: Four times a day (QID) | ORAL | 0 refills | Status: AC | PRN

## 2017-02-04 NOTE — Progress Notes (Signed)
Subjective:    Patient ID: [name], female    DOB: 04-May-1956, 61 y.o.   MRN: [id number]  HPI  Here with acute onset mild to mod 1 day ST, HA, general weakness and malaise, with prod cough greenish sputum, assoc with fast onset mild whezing, sob/doe, similar but not as bad as last visit. but Pt denies chest pain, orthopnea, PND, increased LE swelling, palpitations, dizziness or syncope. Ran out of inhaler, not sure if she can afford to get it today Pt denies new neurological symptoms such as new headache, or facial or extremity weakness or numbness   Pt denies polydipsia, polyuria, No other new history Past Medical History:  Diagnosis Date  . Bronchitis   . CAD (coronary artery disease) 09/03/2013  . CHF (congestive heart failure) ([place])   . COPD (chronic obstructive pulmonary disease) ([place])   . Coronary artery disease   . Headache   . Heart disease   . Hyperlipidemia   . Hypertension   . Impaired glucose tolerance 08/20/2014  . Sinusitis    Past Surgical History:  Procedure Laterality Date  . CORONARY ARTERY BYPASS GRAFT  2003   Triple bypass  . ESOPHAGOGASTRODUODENOSCOPY Left 06/13/2014   Procedure: ESOPHAGOGASTRODUODENOSCOPY (EGD);  Surgeon: Juanita Craver, MD;  Location[facility] ENDOSCOPY;  Service: Endoscopy;  Laterality: Left;  . RIGID ESOPHAGOSCOPY N/A 06/12/2014   Procedure: RIGID ESOPHAGOSCOPY WITH FOREIGN BODY REMOVAL;  Surgeon: Ascencion Dike, MD;  Location: [facility] OR;  Service: ENT;  Laterality: N/A;    reports that she has been smoking Cigarettes.  She has a 20.00 pack-year smoking history. She has never used smokeless tobacco. She reports that she drinks about 3.0 - 3.6 oz of alcohol per week . She reports that she uses drugs, including Marijuana. family history includes Brain cancer in her mother; Cancer in her maternal grandmother; Heart disease in her mother; Lung cancer in her maternal aunt and paternal uncle; Stroke in her other; Suicidality in her brother. No Known  Allergies Current Outpatient Prescriptions on File Prior to Visit  Medication Sig Dispense Refill  . acetaminophen (TYLENOL) 500 MG tablet Take 1,000 mg by mouth at bedtime as needed for headache.    . albuterol (PROVENTIL HFA) 108 (90 Base) MCG/ACT inhaler INHALE 1-2 PUFFS INTO THE LUNGS EVERY 6 (SIX) HOURS AS NEEDED FOR WHEEZING OR SHORTNESS OF BREATH. 6.7 Inhaler 0  . amLODipine (NORVASC) 5 MG tablet TAKE 1 TABLET BY MOUTH EVERY DAY 30 tablet 11  . aspirin EC 81 MG tablet Take 1 tablet (81 mg total) by mouth daily. 30 tablet 0  . carvedilol (COREG) 12.5 MG tablet TAKE 1 TABLET (12.5 MG TOTAL) BY MOUTH 2 (TWO) TIMES DAILY. 60 tablet 11  . cyclobenzaprine (FLEXERIL) 5 MG tablet Take 1 tablet (5 mg total) by mouth 3 (three) times daily as needed for muscle spasms. 60 tablet 1  . doxycycline (VIBRA-TABS) 100 MG tablet Take 1 tablet (100 mg total) by mouth every 12 (twelve) hours. 10 tablet 0  . fluticasone (FLONASE) 50 MCG/ACT nasal spray PLACE 2 SPRAYS INTO BOTH NOSTRILS DAILY. (Patient taking differently: PLACE 2 SPRAYS INTO BOTH NOSTRILS DAILY AS NEEDED FOR ALLERGIES) 16 g 3  . hydrochlorothiazide (MICROZIDE) 12.5 MG capsule Take 12.5 mg by mouth daily as needed (fluid).     Marland Kitchen HYDROcodone-acetaminophen (NORCO) 10-325 MG tablet Take 1 tablet by mouth every 6 (six) hours as needed. (Patient taking differently: Take 1 tablet by mouth every 6 (six) hours as needed for moderate  pain. ) 60 tablet 0  . isosorbide mononitrate (IMDUR) 60 MG 24 hr tablet TAKE 1.5 TABLETS (90 MG TOTAL) BY MOUTH DAILY. 45 tablet 4  . rosuvastatin (CRESTOR) 20 MG tablet Take 1 tablet (20 mg total) by mouth daily. 90 tablet 3  . SPIRIVA HANDIHALER 18 MCG inhalation capsule PLACE 1 CAPSULE (18 MCG TOTAL) INTO INHALER AND INHALE DAILY. 30 capsule 11   No current facility-administered medications on file prior to visit.    Review of Systems  Constitutional: Negative for other unusual diaphoresis or sweats HENT: Negative for  ear discharge or swelling Eyes: Negative for other worsening visual disturbances Respiratory: Negative for stridor or other swelling  Gastrointestinal: Negative for worsening distension or other blood Genitourinary: Negative for retention or other urinary change Musculoskeletal: Negative for other MSK pain or swelling Skin: Negative for color change or other new lesions Neurological: Negative for worsening tremors and other numbness  Psychiatric/Behavioral: Negative for worsening agitation or other fatigue All other system neg per pt    Objective:   Physical Exam BP 134/84   Pulse 84   Ht 5' (1.524 m)   Wt 169 lb (76.7 kg)   SpO2 96%   BMI 33.01 kg/m  VS noted,  Constitutional: Pt appears in NAD HENT: Head: NCAT.  Right Ear: External ear normal.  Left Ear: External ear normal.  Eyes: . Pupils are equal, round, and reactive to light. Conjunctivae and EOM are normal Bilat tm's with mild erythema.  Max sinus areas non tender.  Pharynx with mild erythema, no exudate Nose: without d/c or deformity Neck: Neck supple. Gross normal ROM but with tender bilat submandib LA Cardiovascular: Normal rate and regular rhythm.   Pulmonary/Chest: Effort normal and breath sounds decreased without rales but with mild bilat wheezing.  Neurological: Pt is alert. At baseline orientation, motor grossly intact Skin: Skin is warm. No rashes, other new lesions, no LE edema Psychiatric: Pt behavior is normal without agitation  No other exam findings      Assessment & Plan:

## 2017-02-04 NOTE — Progress Notes (Signed)
Pre visit review using our clinic review tool, if applicable. No additional management support is needed unless otherwise documented below in the visit note. 

## 2017-02-04 NOTE — Assessment & Plan Note (Signed)
Mild to mod, c/w bronchitis bs pna, declines cxr, for antibx course, cough med prn, to f/u any worsening symptoms or concerns

## 2017-02-04 NOTE — Assessment & Plan Note (Signed)
Milder this time, for depomedrol IM 80, predpac asd, and inhaler refilled as reqeusted

## 2017-02-04 NOTE — Patient Instructions (Signed)
You had the breathing treatment today  You had the steroid shot today  Please take all new medication as prescribed - the antibiotic, cough medicine if needed, and prednisone  Please continue all other medications as before, and refills have been done if requested - the albuterol inhaler  Please have the pharmacy call with any other refills you may need.  Please keep your appointments with your specialists as you may have planned

## 2017-02-04 NOTE — Assessment & Plan Note (Signed)
stable overall by history and exam, recent data reviewed with pt, and pt to continue medical treatment as before,  to f/u any worsening symptoms or concerns BP Readings from Last 3 Encounters:  02/04/17 134/84  12/10/16 (!) 118/55  11/28/16 (!) 150/80

## 2017-02-07 ENCOUNTER — Other Ambulatory Visit: Payer: Self-pay | Admitting: Cardiology

## 2017-02-07 ENCOUNTER — Other Ambulatory Visit: Payer: Self-pay | Admitting: Internal Medicine

## 2017-02-08 ENCOUNTER — Encounter (HOSPITAL_COMMUNITY): Payer: Self-pay | Admitting: Emergency Medicine

## 2017-02-08 ENCOUNTER — Emergency Department (HOSPITAL_COMMUNITY): Payer: Commercial Managed Care - HMO

## 2017-02-08 ENCOUNTER — Inpatient Hospital Stay (HOSPITAL_COMMUNITY)
Admission: EM | Admit: 2017-02-08 | Discharge: 2017-02-11 | DRG: 291 | Disposition: A | Discharge status: Home / Self Care | Payer: Commercial Managed Care - HMO | Attending: Internal Medicine | Admitting: Internal Medicine

## 2017-02-08 DIAGNOSIS — I110 Hypertensive heart disease with heart failure: Principal | ICD-10-CM | POA: Diagnosis present

## 2017-02-08 DIAGNOSIS — R519 Headache, unspecified: Secondary | ICD-10-CM | POA: Diagnosis present

## 2017-02-08 DIAGNOSIS — R51 Headache: Secondary | ICD-10-CM | POA: Diagnosis not present

## 2017-02-08 DIAGNOSIS — J449 Chronic obstructive pulmonary disease, unspecified: Secondary | ICD-10-CM | POA: Diagnosis present

## 2017-02-08 DIAGNOSIS — J9621 Acute and chronic respiratory failure with hypoxia: Secondary | ICD-10-CM | POA: Diagnosis present

## 2017-02-08 DIAGNOSIS — Y92230 Patient room in hospital as the place of occurrence of the external cause: Secondary | ICD-10-CM | POA: Diagnosis present

## 2017-02-08 DIAGNOSIS — Z79891 Long term (current) use of opiate analgesic: Secondary | ICD-10-CM

## 2017-02-08 DIAGNOSIS — G4710 Hypersomnia, unspecified: Secondary | ICD-10-CM | POA: Diagnosis not present

## 2017-02-08 DIAGNOSIS — Z801 Family history of malignant neoplasm of trachea, bronchus and lung: Secondary | ICD-10-CM

## 2017-02-08 DIAGNOSIS — E785 Hyperlipidemia, unspecified: Secondary | ICD-10-CM | POA: Diagnosis present

## 2017-02-08 DIAGNOSIS — Z7951 Long term (current) use of inhaled steroids: Secondary | ICD-10-CM

## 2017-02-08 DIAGNOSIS — J441 Chronic obstructive pulmonary disease with (acute) exacerbation: Secondary | ICD-10-CM | POA: Diagnosis present

## 2017-02-08 DIAGNOSIS — R739 Hyperglycemia, unspecified: Secondary | ICD-10-CM | POA: Diagnosis present

## 2017-02-08 DIAGNOSIS — Z8249 Family history of ischemic heart disease and other diseases of the circulatory system: Secondary | ICD-10-CM

## 2017-02-08 DIAGNOSIS — K219 Gastro-esophageal reflux disease without esophagitis: Secondary | ICD-10-CM | POA: Diagnosis present

## 2017-02-08 DIAGNOSIS — I5033 Acute on chronic diastolic (congestive) heart failure: Secondary | ICD-10-CM | POA: Diagnosis present

## 2017-02-08 DIAGNOSIS — Z7952 Long term (current) use of systemic steroids: Secondary | ICD-10-CM

## 2017-02-08 DIAGNOSIS — I10 Essential (primary) hypertension: Secondary | ICD-10-CM | POA: Diagnosis not present

## 2017-02-08 DIAGNOSIS — I2510 Atherosclerotic heart disease of native coronary artery without angina pectoris: Secondary | ICD-10-CM | POA: Diagnosis present

## 2017-02-08 DIAGNOSIS — J9601 Acute respiratory failure with hypoxia: Secondary | ICD-10-CM | POA: Diagnosis not present

## 2017-02-08 DIAGNOSIS — R0602 Shortness of breath: Secondary | ICD-10-CM

## 2017-02-08 DIAGNOSIS — M5416 Radiculopathy, lumbar region: Secondary | ICD-10-CM | POA: Diagnosis present

## 2017-02-08 DIAGNOSIS — Z808 Family history of malignant neoplasm of other organs or systems: Secondary | ICD-10-CM

## 2017-02-08 DIAGNOSIS — D751 Secondary polycythemia: Secondary | ICD-10-CM | POA: Diagnosis present

## 2017-02-08 DIAGNOSIS — Z7982 Long term (current) use of aspirin: Secondary | ICD-10-CM

## 2017-02-08 DIAGNOSIS — Z87891 Personal history of nicotine dependence: Secondary | ICD-10-CM

## 2017-02-08 DIAGNOSIS — J4489 Other specified chronic obstructive pulmonary disease: Secondary | ICD-10-CM | POA: Diagnosis present

## 2017-02-08 DIAGNOSIS — R911 Solitary pulmonary nodule: Secondary | ICD-10-CM | POA: Diagnosis present

## 2017-02-08 DIAGNOSIS — Z823 Family history of stroke: Secondary | ICD-10-CM

## 2017-02-08 DIAGNOSIS — T50995A Adverse effect of other drugs, medicaments and biological substances, initial encounter: Secondary | ICD-10-CM | POA: Diagnosis present

## 2017-02-08 DIAGNOSIS — Z79899 Other long term (current) drug therapy: Secondary | ICD-10-CM

## 2017-02-08 DIAGNOSIS — Z951 Presence of aortocoronary bypass graft: Secondary | ICD-10-CM

## 2017-02-08 DIAGNOSIS — J849 Interstitial pulmonary disease, unspecified: Secondary | ICD-10-CM | POA: Diagnosis present

## 2017-02-08 LAB — BASIC METABOLIC PANEL
Anion gap: 11 (ref 5–15)
BUN: 10 mg/dL (ref 6–20)
CALCIUM: 9.4 mg/dL (ref 8.9–10.3)
CHLORIDE: 97 mmol/L — AB (ref 101–111)
CO2: 31 mmol/L (ref 22–32)
CREATININE: 0.49 mg/dL (ref 0.44–1.00)
GFR calc non Af Amer: >60 mL/min (ref >60)
GLUCOSE: 103 mg/dL — AB (ref 65–99)
Potassium: 3.9 mmol/L (ref 3.5–5.1)
Sodium: 139 mmol/L (ref 135–145)

## 2017-02-08 LAB — CBC
HCT: 52.6 % — ABNORMAL HIGH (ref 36.0–46.0)
Hemoglobin: 17.2 g/dL — ABNORMAL HIGH (ref 12.0–15.0)
MCH: 29.1 pg (ref 26.0–34.0)
MCHC: 32.7 g/dL (ref 30.0–36.0)
MCV: 88.9 fL (ref 78.0–100.0)
PLATELETS: 168 10*3/uL (ref 150–400)
RBC: 5.92 MIL/uL — AB (ref 3.87–5.11)
RDW: 14.8 % (ref 11.5–15.5)
WBC: 11.4 10*3/uL — ABNORMAL HIGH (ref 4.0–10.5)

## 2017-02-08 MED ORDER — HYDROCHLOROTHIAZIDE 12.5 MG PO CAPS: 12.5000 mg | ORAL_CAPSULE | Freq: Every day | ORAL | Status: DC | PRN

## 2017-02-08 MED ORDER — SODIUM CHLORIDE 0.9 % IV BOLUS (SEPSIS)
1000.0000 mL | Freq: Once | INTRAVENOUS | Status: AC
  Administered 2017-02-08: 1000 mL via INTRAVENOUS

## 2017-02-08 MED ORDER — ONDANSETRON HCL 4 MG/2ML IJ SOLN
4.0000 mg | Freq: Once | INTRAMUSCULAR | Status: AC
  Administered 2017-02-08: 4 mg via INTRAVENOUS
  Filled 2017-02-08: qty 2

## 2017-02-08 MED ORDER — HYDROCODONE-HOMATROPINE 5-1.5 MG/5ML PO SYRP: 5.0000 mL | ORAL_SOLUTION | Freq: Four times a day (QID) | ORAL | Status: DC | PRN

## 2017-02-08 MED ORDER — FLUTICASONE PROPIONATE 50 MCG/ACT NA SUSP
2.0000 | Freq: Every day | NASAL | Status: DC
  Administered 2017-02-08 – 2017-02-11 (×3): 2 via NASAL
  Filled 2017-02-08: qty 16

## 2017-02-08 MED ORDER — SENNA 8.6 MG PO TABS
1.0000 | ORAL_TABLET | Freq: Two times a day (BID) | ORAL | Status: DC
  Administered 2017-02-08 – 2017-02-11 (×7): 8.6 mg via ORAL
  Filled 2017-02-08 (×8): qty 1

## 2017-02-08 MED ORDER — ALBUTEROL SULFATE (2.5 MG/3ML) 0.083% IN NEBU
INHALATION_SOLUTION | RESPIRATORY_TRACT | Status: AC
  Filled 2017-02-08: qty 6

## 2017-02-08 MED ORDER — ROSUVASTATIN CALCIUM 20 MG PO TABS
20.0000 mg | ORAL_TABLET | Freq: Every day | ORAL | Status: DC
  Administered 2017-02-08 – 2017-02-10 (×3): 20 mg via ORAL
  Filled 2017-02-08: qty 1
  Filled 2017-02-08: qty 2
  Filled 2017-02-08: qty 1
  Filled 2017-02-08: qty 2
  Filled 2017-02-08: qty 1
  Filled 2017-02-08: qty 2
  Filled 2017-02-08: qty 1

## 2017-02-08 MED ORDER — ALBUTEROL SULFATE (2.5 MG/3ML) 0.083% IN NEBU: 2.5000 mg | INHALATION_SOLUTION | RESPIRATORY_TRACT | Status: DC | PRN

## 2017-02-08 MED ORDER — ISOSORBIDE MONONITRATE ER 60 MG PO TB24
90.0000 mg | ORAL_TABLET | Freq: Every day | ORAL | Status: DC
  Administered 2017-02-08 – 2017-02-10 (×3): 90 mg via ORAL
  Filled 2017-02-08 (×4): qty 1

## 2017-02-08 MED ORDER — DEXTROSE 5 % IV SOLN
500.0000 mg | INTRAVENOUS | Status: DC
  Administered 2017-02-08: 500 mg via INTRAVENOUS
  Filled 2017-02-08 (×2): qty 500

## 2017-02-08 MED ORDER — GUAIFENESIN ER 600 MG PO TB12
600.0000 mg | ORAL_TABLET | Freq: Two times a day (BID) | ORAL | Status: DC
  Administered 2017-02-08 – 2017-02-11 (×7): 600 mg via ORAL
  Filled 2017-02-08 (×7): qty 1

## 2017-02-08 MED ORDER — POLYETHYLENE GLYCOL 3350 17 G PO PACK: 17.0000 g | PACK | Freq: Every day | ORAL | Status: DC | PRN

## 2017-02-08 MED ORDER — IPRATROPIUM-ALBUTEROL 0.5-2.5 (3) MG/3ML IN SOLN
3.0000 mL | Freq: Once | RESPIRATORY_TRACT | Status: AC
  Administered 2017-02-08: 3 mL via RESPIRATORY_TRACT
  Filled 2017-02-08: qty 3

## 2017-02-08 MED ORDER — METHYLPREDNISOLONE SODIUM SUCC 125 MG IJ SOLR
125.0000 mg | Freq: Once | INTRAMUSCULAR | Status: AC
  Administered 2017-02-08: 125 mg via INTRAVENOUS
  Filled 2017-02-08: qty 2

## 2017-02-08 MED ORDER — CYCLOBENZAPRINE HCL 10 MG PO TABS: 5.0000 mg | ORAL_TABLET | Freq: Three times a day (TID) | ORAL | Status: DC | PRN

## 2017-02-08 MED ORDER — AMLODIPINE BESYLATE 5 MG PO TABS
5.0000 mg | ORAL_TABLET | Freq: Every day | ORAL | Status: DC
  Administered 2017-02-08 – 2017-02-10 (×3): 5 mg via ORAL
  Filled 2017-02-08 (×4): qty 1

## 2017-02-08 MED ORDER — ASPIRIN EC 81 MG PO TBEC
81.0000 mg | DELAYED_RELEASE_TABLET | Freq: Every day | ORAL | Status: DC
  Administered 2017-02-08 – 2017-02-11 (×4): 81 mg via ORAL
  Filled 2017-02-08 (×4): qty 1

## 2017-02-08 MED ORDER — ALBUTEROL SULFATE (2.5 MG/3ML) 0.083% IN NEBU
2.5000 mg | INHALATION_SOLUTION | Freq: Four times a day (QID) | RESPIRATORY_TRACT | Status: DC
  Administered 2017-02-08: 2.5 mg via RESPIRATORY_TRACT
  Filled 2017-02-08: qty 3

## 2017-02-08 MED ORDER — CARVEDILOL 12.5 MG PO TABS
12.5000 mg | ORAL_TABLET | Freq: Two times a day (BID) | ORAL | Status: DC
  Administered 2017-02-08 – 2017-02-11 (×6): 12.5 mg via ORAL
  Filled 2017-02-08 (×6): qty 1

## 2017-02-08 MED ORDER — TIOTROPIUM BROMIDE MONOHYDRATE 18 MCG IN CAPS
18.0000 ug | ORAL_CAPSULE | Freq: Every day | RESPIRATORY_TRACT | Status: DC
  Administered 2017-02-09 – 2017-02-11 (×3): 18 ug via RESPIRATORY_TRACT
  Filled 2017-02-08: qty 5

## 2017-02-08 MED ORDER — ONDANSETRON HCL 4 MG/2ML IJ SOLN: 4.0000 mg | Freq: Four times a day (QID) | INTRAMUSCULAR | Status: DC | PRN

## 2017-02-08 MED ORDER — METHYLPREDNISOLONE SODIUM SUCC 125 MG IJ SOLR
60.0000 mg | Freq: Three times a day (TID) | INTRAMUSCULAR | Status: DC
  Administered 2017-02-08 – 2017-02-11 (×9): 60 mg via INTRAVENOUS
  Filled 2017-02-08 (×9): qty 2

## 2017-02-08 MED ORDER — ACETAMINOPHEN 325 MG PO TABS
650.0000 mg | ORAL_TABLET | Freq: Four times a day (QID) | ORAL | Status: DC | PRN
  Administered 2017-02-08 – 2017-02-09 (×2): 650 mg via ORAL
  Filled 2017-02-08 (×2): qty 2

## 2017-02-08 MED ORDER — ALBUTEROL SULFATE (2.5 MG/3ML) 0.083% IN NEBU
5.0000 mg | INHALATION_SOLUTION | Freq: Once | RESPIRATORY_TRACT | Status: AC
  Administered 2017-02-08: 5 mg via RESPIRATORY_TRACT

## 2017-02-08 MED ORDER — ENOXAPARIN SODIUM 40 MG/0.4ML ~~LOC~~ SOLN
40.0000 mg | SUBCUTANEOUS | Status: DC
  Administered 2017-02-08 – 2017-02-10 (×3): 40 mg via SUBCUTANEOUS
  Filled 2017-02-08 (×3): qty 0.4

## 2017-02-08 MED ORDER — HYDROCOD POLST-CPM POLST ER 10-8 MG/5ML PO SUER
5.0000 mL | Freq: Once | ORAL | Status: AC
  Administered 2017-02-08: 5 mL via ORAL
  Filled 2017-02-08: qty 5

## 2017-02-08 MED ORDER — ONDANSETRON HCL 4 MG PO TABS: 4.0000 mg | ORAL_TABLET | Freq: Four times a day (QID) | ORAL | Status: DC | PRN

## 2017-02-08 MED ORDER — ALBUTEROL SULFATE (2.5 MG/3ML) 0.083% IN NEBU
2.5000 mg | INHALATION_SOLUTION | Freq: Three times a day (TID) | RESPIRATORY_TRACT | Status: DC
  Administered 2017-02-09 – 2017-02-11 (×6): 2.5 mg via RESPIRATORY_TRACT
  Filled 2017-02-08 (×9): qty 3

## 2017-02-08 NOTE — ED Triage Notes (Signed)
Reports being seen at family doctor on Tuesday being treated for URI.  Per patient Oxygen level has been in 88-90% for two days.  States I didn't sleep good last night worrying about it so I figured I better come in.

## 2017-02-08 NOTE — ED Provider Notes (Signed)
[facility] DEPT Provider Note   CSN: [id number] Arrival date & time: 02/08/17  1093     History   Chief Complaint Chief Complaint  Patient presents with  . Shortness of Breath    HPI [name] is a 61 y.o. female.  Patient presents with persistent coughing and wheezing for several days. Her pulse ox has been in the mid 80s at home. She was seen by her primary care doctor earlier in the week and placed on prednisone. No antibiotics. She continues to cough excessively. No fever, sweats, chills, rusty sputum. She has a 40 year smoking history, but has quit. Past medical history includes CHF, CAD, hypertension, hyperlipidemia.      Past Medical History:  Diagnosis Date  . Bronchitis   . CAD (coronary artery disease) 09/03/2013  . CHF (congestive heart failure) ([place])   . COPD (chronic obstructive pulmonary disease) ([place])   . Coronary artery disease   . Headache   . Heart disease   . Hyperlipidemia   . Hypertension   . Impaired glucose tolerance 08/20/2014  . Sinusitis     Patient Active Problem List   Diagnosis Date Noted  . Sinusitis, acute 04/08/2016  . Left lumbar radiculopathy 10/19/2015  . Cough 08/29/2015  . COPD exacerbation ([place]) 08/29/2015  . Pedal edema 02/22/2015  . Impaired glucose tolerance 08/20/2014  . Pruritus 07/08/2014  . Nocturnal hypoxemia 07/05/2014  . Esophagus, foreign body 06/12/2014  . Polycythemia, secondary 02/16/2014  . 5 mm Lung nodule, solitary 02/02/2014  . Acute respiratory failure with hypoxia ([place]) 02/01/2014  . GERD (gastroesophageal reflux disease) 02/01/2014  . Hypersomnolence 02/01/2014  . COPD with acute exacerbation ([place]) 02/01/2014  . Allergic rhinitis, cause unspecified 09/03/2013  . CAD (coronary artery disease) 09/03/2013  . Preventative health care 09/03/2013  . Essential hypertension 07/07/2013  . COPD (chronic obstructive pulmonary disease) with chronic bronchitis ([place]) 07/07/2013  . Status post coronary  artery bypass grafting 07/07/2013  . Headache(784.0) 07/01/2013  . Hypertensive emergency 07/01/2013  . Polycythemia 07/01/2013    Past Surgical History:  Procedure Laterality Date  . CORONARY ARTERY BYPASS GRAFT  2003   Triple bypass  . ESOPHAGOGASTRODUODENOSCOPY Left 06/13/2014   Procedure: ESOPHAGOGASTRODUODENOSCOPY (EGD);  Surgeon: Juanita Craver, MD;  Location: [facility] ENDOSCOPY;  Service: Endoscopy;  Laterality: Left;  . RIGID ESOPHAGOSCOPY N/A 06/12/2014   Procedure: RIGID ESOPHAGOSCOPY WITH FOREIGN BODY REMOVAL;  Surgeon: Ascencion Dike, MD;  Location: [facility] OR;  Service: ENT;  Laterality: N/A;    OB History    No data available       Home Medications    Prior to Admission medications   Medication Sig Start Date End Date Taking? Authorizing Provider  acetaminophen (TYLENOL) 500 MG tablet Take 1,000 mg by mouth at bedtime as needed for headache.   Yes Historical Provider, MD  albuterol (PROVENTIL HFA) 108 (90 Base) MCG/ACT inhaler INHALE 1-2 PUFFS INTO THE LUNGS EVERY 6 (SIX) HOURS AS NEEDED FOR WHEEZING OR SHORTNESS OF BREATH. 02/04/17  Yes Biagio Borg, MD  amLODipine (NORVASC) 5 MG tablet TAKE 1 TABLET BY MOUTH EVERY DAY 03/15/16  Yes Jerline Pain, MD  aspirin EC 81 MG tablet Take 1 tablet (81 mg total) by mouth daily. 07/02/13  Yes Olam Idler, MD  carvedilol (COREG) 12.5 MG tablet TAKE 1 TABLET (12.5 MG TOTAL) BY MOUTH 2 (TWO) TIMES DAILY. 03/14/16  Yes Jerline Pain, MD  cyclobenzaprine (FLEXERIL) 5 MG tablet Take 1 tablet (5 mg total) by  mouth 3 (three) times daily as needed for muscle spasms. 10/19/15  Yes Biagio Borg, MD  fluticasone (FLONASE) 50 MCG/ACT nasal spray PLACE 2 SPRAYS INTO BOTH NOSTRILS DAILY. Patient taking differently: PLACE 2 SPRAYS INTO BOTH NOSTRILS DAILY AS NEEDED FOR ALLERGIES 09/22/14  Yes Biagio Borg, MD  hydrochlorothiazide (MICROZIDE) 12.5 MG capsule Take 12.5 mg by mouth daily as needed (fluid).    Yes Historical Provider, MD  HYDROcodone-homatropine  (HYCODAN) 5-1.5 MG/5ML syrup Take 5 mLs by mouth every 6 (six) hours as needed for cough. 02/04/17 02/14/17 Yes Biagio Borg, MD  isosorbide mononitrate (IMDUR) 60 MG 24 hr tablet TAKE 1 1/2 TABLET BY MOUTH DAILY 02/07/17  Yes Jerline Pain, MD  predniSONE (DELTASONE) 10 MG tablet 3 tabs by mouth per day for 3 days,2tabs per day for 3 days,1tab per day for 3 days 02/04/17  Yes Biagio Borg, MD  rosuvastatin (CRESTOR) 20 MG tablet Take 1 tablet (20 mg total) by mouth daily. 11/12/16  Yes Biagio Borg, MD  SPIRIVA HANDIHALER 18 MCG inhalation capsule PLACE 1 CAPSULE INTO INHALER AND INHALE DAILY. 02/07/17  Yes Biagio Borg, MD  HYDROcodone-acetaminophen [facility]) 10-325 MG tablet Take 1 tablet by mouth every 6 (six) hours as needed. Patient not taking: Reported on 02/08/2017 06/05/16   Biagio Borg, MD  levofloxacin (LEVAQUIN) 500 MG tablet Take 1 tablet (500 mg total) by mouth daily. 02/04/17 02/14/17  Biagio Borg, MD    Family History Family History  Problem Relation Age of Onset  . Lung cancer Paternal Uncle   . Heart disease Mother   . Brain cancer Mother   . Cancer Maternal Grandmother     colon  . Stroke Other   . Cancer      x 4 aunts  . Lung cancer Maternal Aunt   . Suicidality Brother     Social History Social History  Substance Use Topics  . Smoking status: Current Every Day Smoker    Packs/day: 0.50    Years: 40.00    Types: Cigarettes  . Smokeless tobacco: Never Used  . Alcohol use 3.0 - 3.6 oz/week    5 - 6 Glasses of wine per week     Allergies   Patient has no known allergies.   Review of Systems Review of Systems  All other systems reviewed and are negative.    Physical Exam Updated Vital Signs BP 111/71   Pulse (!) 59   Temp 98.3 F (36.8 C) (Oral)   Resp (!) 24   Ht 5' (1.524 m)   Wt 169 lb (76.7 kg)   SpO2 98%   BMI 33.01 kg/m   Physical Exam  Constitutional: She is oriented to person, place, and time.  Persistent coughing  HENT:  Head:  Normocephalic and atraumatic.  Eyes: Conjunctivae are normal.  Neck: Neck supple.  Cardiovascular: Normal rate and regular rhythm.   Pulmonary/Chest:  Bilateral expiratory wheezing  Abdominal: Soft. Bowel sounds are normal.  Musculoskeletal: Normal range of motion.  Neurological: She is alert and oriented to person, place, and time.  Skin: Skin is warm and dry.  Psychiatric: She has a normal mood and affect. Her behavior is normal.  Nursing note and vitals reviewed.    ED Treatments / Results  Labs (all labs ordered are listed, but only abnormal results are displayed) Labs Reviewed  BASIC METABOLIC PANEL - Abnormal; Notable for the following:       Result Value  Chloride 97 (*)    Glucose, Bld 103 (*)    All other components within normal limits  CBC - Abnormal; Notable for the following:    WBC 11.4 (*)    RBC 5.92 (*)    Hemoglobin 17.2 (*)    HCT 52.6 (*)    All other components within normal limits    EKG  EKG Interpretation None       Radiology Dg Chest 2 View  Result Date: 02/08/2017 CLINICAL DATA:  Recently treated for upper respiratory infection. Oxygen levels at 80 10 90% for 2 days. Shortness of breath, productive cough since Tuesday and weakness. History of coronary artery disease, CHF, COPD, bronchitis, hypertension. EXAM: CHEST  2 VIEW COMPARISON:  Chest x-ray dated 12/08/2016 and chest x-ray dated 08/30/2015. FINDINGS: Median sternotomy wires appear intact and stable in alignment. Surgical changes of CABG. Atherosclerotic changes noted at the aortic arch. Lungs are clear. Coarse interstitial lung markings are stable. Chronic bronchitic changes noted centrally. No confluent opacity to suggest a developing pneumonia. No pleural effusion or pneumothorax seen. No acute or suspicious osseous finding. IMPRESSION: 1. No active cardiopulmonary disease. No evidence of pneumonia or pulmonary edema. 2. Probable chronic bronchitic changes and chronic interstitial lung  disease. 3. Aortic atherosclerosis. Electronically Signed   By: Franki Cabot M.D.   On: 02/08/2017 07:41    Procedures Procedures (including critical care time)  Medications Ordered in ED Medications  albuterol (PROVENTIL) (2.5 MG/3ML) 0.083% nebulizer solution 5 mg (5 mg Nebulization Given 02/08/17 0654)  sodium chloride 0.9 % bolus 1,000 mL (1,000 mLs Intravenous New Bag/Given 02/08/17 0926)  ipratropium-albuterol (DUONEB) 0.5-2.5 (3) MG/3ML nebulizer solution 3 mL (3 mLs Nebulization Given 02/08/17 0925)  methylPREDNISolone sodium succinate (SOLU-MEDROL) 125 mg/2 mL injection 125 mg (125 mg Intravenous Given 02/08/17 0925)  chlorpheniramine-HYDROcodone (TUSSIONEX) 10-8 MG/5ML suspension 5 mL (5 mLs Oral Given 02/08/17 0924)  ondansetron (ZOFRAN) injection 4 mg (4 mg Intravenous Given 02/08/17 0925)     Initial Impression / Assessment and Plan / ED Course  I have reviewed the triage vital signs and the nursing notes.  Pertinent labs & imaging results that were available during my care of the patient were reviewed by me and considered in my medical decision making (see chart for details).     Patient continues to desaturate after nebulizer treatment and IV steroids. Chest x-ray shows no no pneumonia. Will admit to general medicine.  Final Clinical Impressions(s) / ED Diagnoses   Final diagnoses:  COPD exacerbation ([place])    New Prescriptions New Prescriptions   No medications on file     Nat Christen, MD 02/08/17 1109

## 2017-02-08 NOTE — H&P (Signed)
History and Physical    GAYNOR GENCO BPZ:[id number] DOB: [birth year] DOA: 02/08/2017   PCP: Cathlean Cower, MD   Patient coming from:  Home    Chief Complaint: Shortness of breath  HPI: [name] is a 61 y.o. female  COPD not on home O2,  CHF, CAD, HTN, HLD, prior tobacco abuse, history of a 5 mm solitary lung nodule, presenting to the ED with worsening shortness of breath with productive thick grey cough following a recent "cold". This is the second admission since February of this year with similar complaints. In review, she been seen with similar symptoms on 4/17 by her PCP, who placed the patient on prednisone taper and to continue with home inhalers and Spiriva, along with oral Levaquin  without significant improvement. At the time of presentation her Osats were in the 80s despite the correct use of meds at home.  Denies rhinorrhea or hemoptysis. Denies fevers, chills, night sweats or mucositis. She has discontinued tobacco in February. She does have exposure to second hand smoking. She also has some irritant "dust" exposure at work.  Denies any chest pain, chest wall pain or palpitations.Denies any sick contacts or recent long distance travels. Denies any abdominal pain. Has decreased appetite due to current symptoms.  Denies lower extremity swelling. No confusion was reported.    ED Course:  BP 136/63   Pulse 86   Temp 98.3 F (36.8 C) (Oral)   Resp 18   Ht 5' (1.524 m)   Wt 76.7 kg (169 lb)   SpO2 92%   BMI 33.01 kg/m   WBC 11.4 Hb 17.2 (h/o polycythemia)  Given Albuterol, Duoneb, Solumedrol 125 mg, tussionex, and she is on 2 L without significant improvement of her O2sats   CO2 in CMET was 31  Cr 0.49  A1C 6.1  CXR NAD, possible some chronic bronchitic changes with chronic interstitial lung disease   Review of Systems: As per HPI otherwise 10 point review of systems negative.   Past Medical History:  Diagnosis Date  . Bronchitis   . CAD (coronary artery disease)  09/03/2013  . CHF (congestive heart failure) ([place])   . COPD (chronic obstructive pulmonary disease) ([place])   . Coronary artery disease   . Headache   . Heart disease   . Hyperlipidemia   . Hypertension   . Impaired glucose tolerance 08/20/2014  . Sinusitis     Past Surgical History:  Procedure Laterality Date  . CORONARY ARTERY BYPASS GRAFT  2003   Triple bypass  . ESOPHAGOGASTRODUODENOSCOPY Left 06/13/2014   Procedure: ESOPHAGOGASTRODUODENOSCOPY (EGD);  Surgeon: Juanita Craver, MD;  Location: [facility] ENDOSCOPY;  Service: Endoscopy;  Laterality: Left;  . RIGID ESOPHAGOSCOPY N/A 06/12/2014   Procedure: RIGID ESOPHAGOSCOPY WITH FOREIGN BODY REMOVAL;  Surgeon: Ascencion Dike, MD;  Location: [facility] OR;  Service: ENT;  Laterality: N/A;    Social History Social History   Social History  . Marital status: Single    Spouse name: N/A  . Number of children: N/A  . Years of education: N/A   Occupational History  . Not on file.   Social History Main Topics  . Smoking status: Current Every Day Smoker    Packs/day: 0.50    Years: 40.00    Types: Cigarettes  . Smokeless tobacco: Never Used  . Alcohol use 3.0 - 3.6 oz/week    5 - 6 Glasses of wine per week  . Drug use: Yes    Types: Marijuana  Comment: as a teenager  . Sexual activity: Not Currently    Birth control/ protection: None   Other Topics Concern  . Not on file   Social History Narrative  . No narrative on file     No Known Allergies  Family History  Problem Relation Age of Onset  . Lung cancer Paternal Uncle   . Heart disease Mother   . Brain cancer Mother   . Cancer Maternal Grandmother     colon  . Stroke Other   . Cancer      x 4 aunts  . Lung cancer Maternal Aunt   . Suicidality Brother       Prior to Admission medications   Medication Sig Start Date End Date Taking? Authorizing Provider  acetaminophen (TYLENOL) 500 MG tablet Take 1,000 mg by mouth at bedtime as needed for headache.   Yes Historical Provider,  MD  albuterol (PROVENTIL HFA) 108 (90 Base) MCG/ACT inhaler INHALE 1-2 PUFFS INTO THE LUNGS EVERY 6 (SIX) HOURS AS NEEDED FOR WHEEZING OR SHORTNESS OF BREATH. 02/04/17  Yes Biagio Borg, MD  amLODipine (NORVASC) 5 MG tablet TAKE 1 TABLET BY MOUTH EVERY DAY 03/15/16  Yes Jerline Pain, MD  aspirin EC 81 MG tablet Take 1 tablet (81 mg total) by mouth daily. 07/02/13  Yes Olam Idler, MD  carvedilol (COREG) 12.5 MG tablet TAKE 1 TABLET (12.5 MG TOTAL) BY MOUTH 2 (TWO) TIMES DAILY. 03/14/16  Yes Jerline Pain, MD  cyclobenzaprine (FLEXERIL) 5 MG tablet Take 1 tablet (5 mg total) by mouth 3 (three) times daily as needed for muscle spasms. 10/19/15  Yes Biagio Borg, MD  fluticasone (FLONASE) 50 MCG/ACT nasal spray PLACE 2 SPRAYS INTO BOTH NOSTRILS DAILY. Patient taking differently: PLACE 2 SPRAYS INTO BOTH NOSTRILS DAILY AS NEEDED FOR ALLERGIES 09/22/14  Yes Biagio Borg, MD  hydrochlorothiazide (MICROZIDE) 12.5 MG capsule Take 12.5 mg by mouth daily as needed (fluid).    Yes Historical Provider, MD  HYDROcodone-homatropine (HYCODAN) 5-1.5 MG/5ML syrup Take 5 mLs by mouth every 6 (six) hours as needed for cough. 02/04/17 02/14/17 Yes Biagio Borg, MD  isosorbide mononitrate (IMDUR) 60 MG 24 hr tablet TAKE 1 1/2 TABLET BY MOUTH DAILY 02/07/17  Yes Jerline Pain, MD  predniSONE (DELTASONE) 10 MG tablet 3 tabs by mouth per day for 3 days,2tabs per day for 3 days,1tab per day for 3 days 02/04/17  Yes Biagio Borg, MD  rosuvastatin (CRESTOR) 20 MG tablet Take 1 tablet (20 mg total) by mouth daily. 11/12/16  Yes Biagio Borg, MD  SPIRIVA HANDIHALER 18 MCG inhalation capsule PLACE 1 CAPSULE INTO INHALER AND INHALE DAILY. 02/07/17  Yes Biagio Borg, MD  HYDROcodone-acetaminophen [facility]) 10-325 MG tablet Take 1 tablet by mouth every 6 (six) hours as needed. Patient not taking: Reported on 02/08/2017 06/05/16   Biagio Borg, MD  levofloxacin (LEVAQUIN) 500 MG tablet Take 1 tablet (500 mg total) by mouth daily. 02/04/17  02/14/17  Biagio Borg, MD    Physical Exam:  Vitals:   02/08/17 1045 02/08/17 1100 02/08/17 1115 02/08/17 1130  BP: (!) 114/58 111/71 (!) 115/54 136/63  Pulse: (!) 57 (!) 59 85 86  Resp: (!) 21 (!) 24 20 18   Temp:      TempSrc:      SpO2: 97% 98% 98% 92%  Weight:      Height:       Constitutional: NAD, calm, comfortable  Eyes:  PERRL, lids and conjunctivae normal ENMT: Mucous membranes are moist, without exudate or lesions  Neck: normal, supple, no masses, no thyromegaly Respiratory: bilateral expiratory  wheezing, no crackles, sonme ronchi audible . Normal respiratory effort  Cardiovascular: Regular rate and rhythm, no murmurs, rubs or gallops. No extremity edema. 2+ pedal pulses. No carotid bruits.  Abdomen: Soft, non tender, No hepatosplenomegaly. Bowel sounds positive.  Musculoskeletal: no clubbing / cyanosis. Moves all extremities Skin: no jaundice, No lesions.  Neurologic: Sensation intact  Strength equal in all extremities Psychiatric:   Alert and oriented x 3. Normal mood.     Labs on Admission: I have personally reviewed following labs and imaging studies  CBC:  Recent Labs Lab 02/08/17 0651  WBC 11.4*  HGB 17.2*  HCT 52.6*  MCV 88.9  PLT 267    Basic Metabolic Panel:  Recent Labs Lab 02/08/17 0651  NA 139  K 3.9  CL 97*  CO2 31  GLUCOSE 103*  BUN 10  CREATININE 0.49  CALCIUM 9.4    GFR: Estimated Creatinine Clearance: 67.6 mL/min (by C-G formula based on SCr of 0.49 mg/dL).  Liver Function Tests: No results for input(s): AST, ALT, ALKPHOS, BILITOT, PROT, ALBUMIN in the last 168 hours. No results for input(s): LIPASE, AMYLASE in the last 168 hours. No results for input(s): AMMONIA in the last 168 hours.  Coagulation Profile: No results for input(s): INR, PROTIME in the last 168 hours.  Cardiac Enzymes: No results for input(s): CKTOTAL, CKMB, CKMBINDEX, TROPONINI in the last 168 hours.  BNP (last 3 results) No results for input(s):  PROBNP in the last 8760 hours.  HbA1C: No results for input(s): HGBA1C in the last 72 hours.  CBG: No results for input(s): GLUCAP in the last 168 hours.  Lipid Profile: No results for input(s): CHOL, HDL, LDLCALC, TRIG, CHOLHDL, LDLDIRECT in the last 72 hours.  Thyroid Function Tests: No results for input(s): TSH, T4TOTAL, FREET4, T3FREE, THYROIDAB in the last 72 hours.  Anemia Panel: No results for input(s): VITAMINB12, FOLATE, FERRITIN, TIBC, IRON, RETICCTPCT in the last 72 hours.  Urine analysis:    Component Value Date/Time   COLORURINE YELLOW 10/19/2015 1018   APPEARANCEUR CLEAR 10/19/2015 1018   LABSPEC 1.025 10/19/2015 1018   PHURINE 5.5 10/19/2015 1018   GLUCOSEU NEGATIVE 10/19/2015 1018   HGBUR NEGATIVE 10/19/2015 1018   BILIRUBINUR NEGATIVE 10/19/2015 1018   KETONESUR NEGATIVE 10/19/2015 1018   PROTEINUR NEGATIVE 02/01/2014 2131   UROBILINOGEN 0.2 10/19/2015 1018   NITRITE NEGATIVE 10/19/2015 1018   LEUKOCYTESUR NEGATIVE 10/19/2015 1018    Sepsis Labs: @LABRCNTIP (procalcitonin:4,lacticidven:4) )No results found for this or any previous visit (from the past 240 hour(s)).   Radiological Exams on Admission: Dg Chest 2 View  Result Date: 02/08/2017 CLINICAL DATA:  Recently treated for upper respiratory infection. Oxygen levels at 80 10 90% for 2 days. Shortness of breath, productive cough since Tuesday and weakness. History of coronary artery disease, CHF, COPD, bronchitis, hypertension. EXAM: CHEST  2 VIEW COMPARISON:  Chest x-ray dated 12/08/2016 and chest x-ray dated 08/30/2015. FINDINGS: Median sternotomy wires appear intact and stable in alignment. Surgical changes of CABG. Atherosclerotic changes noted at the aortic arch. Lungs are clear. Coarse interstitial lung markings are stable. Chronic bronchitic changes noted centrally. No confluent opacity to suggest a developing pneumonia. No pleural effusion or pneumothorax seen. No acute or suspicious osseous finding.  IMPRESSION: 1. No active cardiopulmonary disease. No evidence of pneumonia or pulmonary edema. 2. Probable chronic bronchitic changes and chronic  interstitial lung disease. 3. Aortic atherosclerosis. Electronically Signed   By: Franki Cabot M.D.   On: 02/08/2017 07:41    EKG: Independently reviewed.  Assessment/Plan Active Problems:   Acute respiratory failure with hypoxia (HCC)   COPD exacerbation (HCC)   Headache   Polycythemia   Essential hypertension   COPD (chronic obstructive pulmonary disease) with chronic bronchitis (HCC)   CAD (coronary artery disease)   GERD (gastroesophageal reflux disease)   Hypersomnolence   COPD with acute exacerbation (HCC)   Left lumbar radiculopathy    Acute Respiratory Failure with Hypoxia likely due to COPD  exacerbation.Presenting now with ongoing / progressive symptoms including productive cough. Afebrile  CXR today suggests some bronchitic changes but no active disease.  WBC 11   CO2 in CMET was 31 Given Albuterol, Duoneb, Solumedrol 125 mg, tussionex, and she is on 2 L without significant improvement of her O2sats. No tobacco use at this time, quit in 11/2016  Admit to  Med Surg Obs  Oxygen sputum cultures Nebulizers as needed, with Duoneb and  Albuterol  Solumedrol IV 60 mg q 8  Mucinex prn  Repeat CBC in am Levaquin  daily   Known lung nodule, 5 mm per CT This is followed by her PCP   Hypertension BP 136/63   Pulse 86    Controlled Continue home anti-hypertensive medications with Coreg, Imdur, HCTZ, amlodipine  Chronic CHF/ history of CAD  Appears compensated . Chest pain free  Weight 169 lb  CXR NAD  IV Lasix  continue home meds  monitor I/Os and daily weights  02   History of hyperglycemia  Current blood sugar level is 103 Lab Results  Component Value Date   HGBA1C 6.1 (H) 12/10/2016  Monitor CBGs  Heart healthy carb modified diet.  Polycythemia, chronic, likely secondary to COPD  BL 16-17, stable . No erythromyalgia  present .Current Hb 17, HCT 52 Can be followed as OP  Hyperlipidemia Continue home statins   GERD, no acute symptoms Continue PPI   DVT prophylaxis: Lovenox  Code Status:   Full    Family Communication:  Discussed with patient Disposition Plan: Expect patient to be discharged to home after condition improves Consults called:    None  Admission status:  Medsurg Obs   Lennix Rotundo E, PA-C [facility]   02/08/2017, 11:54 AM

## 2017-02-08 NOTE — Progress Notes (Signed)
Pt arrived to 5N14 via stretcher from ED with RN. Pt alert, oriented and in NAD. O2 on at [facility]. Pt able to ambulate independently to bed. Assessment as noted. VSS. Skin swarm completed with Stanton Kidney, RN with no areas of breakdown or concern noted. POC discussed. Pt v/u and agreement with plan. Pt requests tylenol for headache secondary to continuous nebs in ED. No order at present. Message sent to provider via amion with request. Continuous pleth initated. Pt oriented to room/unit. Pt denies any additional needs or c/o at present. Bed low and locked with call bell in reach.

## 2017-02-09 ENCOUNTER — Observation Stay (HOSPITAL_BASED_OUTPATIENT_CLINIC_OR_DEPARTMENT_OTHER): Payer: Commercial Managed Care - HMO

## 2017-02-09 DIAGNOSIS — I509 Heart failure, unspecified: Secondary | ICD-10-CM

## 2017-02-09 DIAGNOSIS — J9601 Acute respiratory failure with hypoxia: Secondary | ICD-10-CM

## 2017-02-09 DIAGNOSIS — J441 Chronic obstructive pulmonary disease with (acute) exacerbation: Secondary | ICD-10-CM | POA: Diagnosis not present

## 2017-02-09 DIAGNOSIS — R0602 Shortness of breath: Secondary | ICD-10-CM | POA: Diagnosis not present

## 2017-02-09 LAB — ECHOCARDIOGRAM COMPLETE
HEIGHTINCHES: 60 in
Weight: 2704 oz

## 2017-02-09 LAB — BASIC METABOLIC PANEL
Anion gap: 8 (ref 5–15)
BUN: 11 mg/dL (ref 6–20)
CO2: 32 mmol/L (ref 22–32)
Calcium: 9.1 mg/dL (ref 8.9–10.3)
Chloride: 98 mmol/L — ABNORMAL LOW (ref 101–111)
Creatinine, Ser: 0.46 mg/dL (ref 0.44–1.00)
GFR calc Af Amer: >60 mL/min (ref >60)
GLUCOSE: 163 mg/dL — AB (ref 65–99)
Potassium: 4.5 mmol/L (ref 3.5–5.1)
Sodium: 138 mmol/L (ref 135–145)

## 2017-02-09 LAB — CBC
HEMATOCRIT: 52.1 % — AB (ref 36.0–46.0)
Hemoglobin: 16.8 g/dL — ABNORMAL HIGH (ref 12.0–15.0)
MCH: 28.9 pg (ref 26.0–34.0)
MCHC: 32.2 g/dL (ref 30.0–36.0)
MCV: 89.7 fL (ref 78.0–100.0)
Platelets: 173 10*3/uL (ref 150–400)
RBC: 5.81 MIL/uL — ABNORMAL HIGH (ref 3.87–5.11)
RDW: 14.5 % (ref 11.5–15.5)
WBC: 13.2 10*3/uL — ABNORMAL HIGH (ref 4.0–10.5)

## 2017-02-09 LAB — EXPECTORATED SPUTUM ASSESSMENT W GRAM STAIN, RFLX TO RESP C

## 2017-02-09 LAB — EXPECTORATED SPUTUM ASSESSMENT W REFEX TO RESP CULTURE

## 2017-02-09 MED ORDER — AZITHROMYCIN 250 MG PO TABS
250.0000 mg | ORAL_TABLET | Freq: Every day | ORAL | Status: DC
  Administered 2017-02-09 – 2017-02-11 (×3): 250 mg via ORAL
  Filled 2017-02-09 (×3): qty 1

## 2017-02-09 MED ORDER — FUROSEMIDE 10 MG/ML IJ SOLN
40.0000 mg | Freq: Once | INTRAMUSCULAR | Status: AC
  Administered 2017-02-09: 40 mg via INTRAVENOUS
  Filled 2017-02-09: qty 4

## 2017-02-09 NOTE — Progress Notes (Signed)
  Echocardiogram 2D Echocardiogram has been performed.  [name] [name] 02/09/2017, 3:26 PM

## 2017-02-09 NOTE — Progress Notes (Signed)
PROGRESS NOTE                                                                                                                                                                                                             Patient Demographics:    [name], is a 61 y.o. female, DOB - [birth year], ZPH:[id number]  Admit date - 02/08/2017   Admitting Physician Truett Mainland, DO  Outpatient Primary MD for the patient is Cathlean Cower, MD  LOS - 0  Chief Complaint  Patient presents with  . Shortness of Breath       Brief Narrative   [name] is a 61 y.o. female  COPD not on home O2,  CHF, CAD, HTN, HLD, prior tobacco abuse, history of a 5 mm solitary lung nodule, presenting to the ED with worsening shortness of breath with productive thick grey cough following a recent "cold". This is the second admission since February of this year with similar complaints, she was diagnosed with COPD exacerbation and admitted.   Subjective:    [place] today has, No headache, No chest pain, No abdominal pain - No Nausea, No new weakness tingling or numbness, No Cough - improved SOB.    Assessment  & Plan :     1.Acute hypoxic respiratory failure diagnosed upon admission due to COPD exacerbation. Patient is improving, she claims she quit smoking 2 months ago and was counseled to continue to abstain. Continue IV Solu-Medrol, oral azithromycin, nebulizer treatments along with oxygen as needed. Will try to titrate off oxygen as soon as possible.  2. History of 5 mm lung nodule per CT scan. Follow with PCP and outpatient pulmonary.  3. Hypertension. On Coreg, Imdur, HCTZ and Norvasc combination continue.  4. History of CAD along with chronic diastolic CHF last EF reported normal on echocardiogram from 2010. Minimal rales on exam, BNP stable, check echocardiogram, gentle one time Lasix and monitor. After discharge follow with primary  cardiologist Dr. Marlou Porch. For now continue Coreg, Imdur and statin for secondary prevention.  5. Dyslipidemia. On statin.  6. Smoking-induced polycythemia. Follow with PCP.    Diet : Diet heart healthy/carb modified Room service appropriate? Yes; Fluid consistency: Thin; Fluid restriction: 1500 mL Fluid    Family Communication  : None  Code Status :  Full  Disposition Plan  :  Home 1-2 days  Consults  :  None  Procedures  :    DVT Prophylaxis  :  Lovenox   Lab Results  Component Value Date   PLT 173 02/09/2017    Inpatient Medications  Scheduled Meds: . albuterol  2.5 mg Nebulization TID  . amLODipine  5 mg Oral Daily  . aspirin EC  81 mg Oral Daily  . carvedilol  12.5 mg Oral BID WC  . enoxaparin (LOVENOX) injection  40 mg Subcutaneous Q24H  . fluticasone  2 spray Each Nare Daily  . guaiFENesin  600 mg Oral BID  . isosorbide mononitrate  90 mg Oral Daily  . methylPREDNISolone (SOLU-MEDROL) injection  60 mg Intravenous Q8H  . rosuvastatin  20 mg Oral q1800  . senna  1 tablet Oral BID  . tiotropium  18 mcg Inhalation Daily   Continuous Infusions: . azithromycin Stopped (02/08/17 1700)   PRN Meds:.acetaminophen, albuterol, cyclobenzaprine, hydrochlorothiazide, HYDROcodone-homatropine, ondansetron **OR** ondansetron (ZOFRAN) IV, polyethylene glycol  Antibiotics  :    Anti-infectives    Start     Dose/Rate Route Frequency Ordered Stop   02/08/17 1300  azithromycin (ZITHROMAX) 500 mg in dextrose 5 % 250 mL IVPB     500 mg 250 mL/hr over 60 Minutes Intravenous Every 24 hours 02/08/17 1202           Objective:   Vitals:   02/09/17 0526 02/09/17 0551 02/09/17 0904 02/09/17 0934  BP: (!) 112/42   (!) 123/55  Pulse: (!) 51 60 60 65  Resp: 16  18 18   Temp: 98.8 F (37.1 C)     TempSrc: Oral     SpO2: 98%  97% 94%  Weight:      Height:        Wt Readings from Last 3 Encounters:  02/08/17 76.7 kg (169 lb)  02/04/17 76.7 kg (169 lb)  12/09/16 71.7 kg (158  lb)     Intake/Output Summary (Last 24 hours) at 02/09/17 1023 Last data filed at 02/08/17 2000  Gross per 24 hour  Intake             2130 ml  Output                0 ml  Net             2130 ml     Physical Exam  Awake Alert, Oriented X 3, No new F.N deficits, Normal affect [place].AT,PERRAL Supple Neck,No JVD, No cervical lymphadenopathy appriciated.  Symmetrical Chest wall movement, Good air movement bilaterally, mild wheezing RRR,No Gallops,Rubs or new Murmurs, No Parasternal Heave +ve B.Sounds, Abd Soft, No tenderness, No organomegaly appriciated, No rebound - guarding or rigidity. No Cyanosis, Clubbing or edema, No new Rash or bruise     Data Review:    CBC  Recent Labs Lab 02/08/17 0651 02/09/17 0749  WBC 11.4* 13.2*  HGB 17.2* 16.8*  HCT 52.6* 52.1*  PLT 168 173  MCV 88.9 89.7  MCH 29.1 28.9  MCHC 32.7 32.2  RDW 14.8 14.5    Chemistries   Recent Labs Lab 02/08/17 0651 02/09/17 0749  NA 139 138  K 3.9 4.5  CL 97* 98*  CO2 31 32  GLUCOSE 103* 163*  BUN 10 11  CREATININE 0.49 0.46  CALCIUM 9.4 9.1   ------------------------------------------------------------------------------------------------------------------ No results for input(s): CHOL, HDL, LDLCALC, TRIG, CHOLHDL, LDLDIRECT in the last 72 hours.  Lab Results  Component Value Date   HGBA1C 6.1 (H) 12/10/2016   ------------------------------------------------------------------------------------------------------------------  No results for input(s): TSH, T4TOTAL, T3FREE, THYROIDAB in the last 72 hours.  Invalid input(s): FREET3 ------------------------------------------------------------------------------------------------------------------ No results for input(s): VITAMINB12, FOLATE, FERRITIN, TIBC, IRON, RETICCTPCT in the last 72 hours.  Coagulation profile No results for input(s): INR, PROTIME in the last 168 hours.  No results for input(s): DDIMER in the last 72 hours.  Cardiac  Enzymes No results for input(s): CKMB, TROPONINI, MYOGLOBIN in the last 168 hours.  Invalid input(s): CK ------------------------------------------------------------------------------------------------------------------    Component Value Date/Time   BNP 65.1 12/08/2016 2350    Micro Results Recent Results (from the past 240 hour(s))  Culture, expectorated sputum-assessment     Status: None   Collection Time: 02/08/17  9:23 PM  Result Value Ref Range Status   Specimen Description SPUTUM  Final   Special Requests NONE  Final   Sputum evaluation   Final    Sputum specimen not acceptable for testing.  Please recollect.   RESULT CALLED TO, READ BACK BY AND VERIFIED WITH: R LIGHT 02/09/17 @ 8 M VESTAL    Report Status 02/09/2017 FINAL  Final    Radiology Reports Dg Chest 2 View  Result Date: 02/08/2017 CLINICAL DATA:  Recently treated for upper respiratory infection. Oxygen levels at 80 10 90% for 2 days. Shortness of breath, productive cough since Tuesday and weakness. History of coronary artery disease, CHF, COPD, bronchitis, hypertension. EXAM: CHEST  2 VIEW COMPARISON:  Chest x-ray dated 12/08/2016 and chest x-ray dated 08/30/2015. FINDINGS: Median sternotomy wires appear intact and stable in alignment. Surgical changes of CABG. Atherosclerotic changes noted at the aortic arch. Lungs are clear. Coarse interstitial lung markings are stable. Chronic bronchitic changes noted centrally. No confluent opacity to suggest a developing pneumonia. No pleural effusion or pneumothorax seen. No acute or suspicious osseous finding. IMPRESSION: 1. No active cardiopulmonary disease. No evidence of pneumonia or pulmonary edema. 2. Probable chronic bronchitic changes and chronic interstitial lung disease. 3. Aortic atherosclerosis. Electronically Signed   By: Franki Cabot M.D.   On: 02/08/2017 07:41    Time Spent in minutes  30   Lala Lund M.D on 02/09/2017 at 10:23 AM  Between 7am to 7pm -  Pager - [phone] ( page via [facility], text pages only, please mention full 10 digit call back number). After 7pm go to [email or web address] - password [facility]

## 2017-02-10 ENCOUNTER — Observation Stay (HOSPITAL_COMMUNITY): Payer: Commercial Managed Care - HMO

## 2017-02-10 DIAGNOSIS — Y92230 Patient room in hospital as the place of occurrence of the external cause: Secondary | ICD-10-CM | POA: Diagnosis present

## 2017-02-10 DIAGNOSIS — Z87891 Personal history of nicotine dependence: Secondary | ICD-10-CM | POA: Diagnosis not present

## 2017-02-10 DIAGNOSIS — R0602 Shortness of breath: Secondary | ICD-10-CM

## 2017-02-10 DIAGNOSIS — Z808 Family history of malignant neoplasm of other organs or systems: Secondary | ICD-10-CM | POA: Diagnosis not present

## 2017-02-10 DIAGNOSIS — I2510 Atherosclerotic heart disease of native coronary artery without angina pectoris: Secondary | ICD-10-CM | POA: Diagnosis present

## 2017-02-10 DIAGNOSIS — Z7982 Long term (current) use of aspirin: Secondary | ICD-10-CM | POA: Diagnosis not present

## 2017-02-10 DIAGNOSIS — Z823 Family history of stroke: Secondary | ICD-10-CM | POA: Diagnosis not present

## 2017-02-10 DIAGNOSIS — I5033 Acute on chronic diastolic (congestive) heart failure: Secondary | ICD-10-CM | POA: Diagnosis present

## 2017-02-10 DIAGNOSIS — M5416 Radiculopathy, lumbar region: Secondary | ICD-10-CM | POA: Diagnosis present

## 2017-02-10 DIAGNOSIS — E785 Hyperlipidemia, unspecified: Secondary | ICD-10-CM | POA: Diagnosis present

## 2017-02-10 DIAGNOSIS — Z7952 Long term (current) use of systemic steroids: Secondary | ICD-10-CM | POA: Diagnosis not present

## 2017-02-10 DIAGNOSIS — Z79891 Long term (current) use of opiate analgesic: Secondary | ICD-10-CM | POA: Diagnosis not present

## 2017-02-10 DIAGNOSIS — K219 Gastro-esophageal reflux disease without esophagitis: Secondary | ICD-10-CM | POA: Diagnosis present

## 2017-02-10 DIAGNOSIS — Z951 Presence of aortocoronary bypass graft: Secondary | ICD-10-CM | POA: Diagnosis not present

## 2017-02-10 DIAGNOSIS — I110 Hypertensive heart disease with heart failure: Secondary | ICD-10-CM | POA: Diagnosis present

## 2017-02-10 DIAGNOSIS — J849 Interstitial pulmonary disease, unspecified: Secondary | ICD-10-CM | POA: Diagnosis present

## 2017-02-10 DIAGNOSIS — Z8249 Family history of ischemic heart disease and other diseases of the circulatory system: Secondary | ICD-10-CM | POA: Diagnosis not present

## 2017-02-10 DIAGNOSIS — G4710 Hypersomnia, unspecified: Secondary | ICD-10-CM | POA: Diagnosis present

## 2017-02-10 DIAGNOSIS — D751 Secondary polycythemia: Secondary | ICD-10-CM | POA: Diagnosis present

## 2017-02-10 DIAGNOSIS — J441 Chronic obstructive pulmonary disease with (acute) exacerbation: Secondary | ICD-10-CM | POA: Diagnosis present

## 2017-02-10 DIAGNOSIS — J9601 Acute respiratory failure with hypoxia: Secondary | ICD-10-CM | POA: Diagnosis present

## 2017-02-10 DIAGNOSIS — Z801 Family history of malignant neoplasm of trachea, bronchus and lung: Secondary | ICD-10-CM | POA: Diagnosis not present

## 2017-02-10 DIAGNOSIS — R911 Solitary pulmonary nodule: Secondary | ICD-10-CM | POA: Diagnosis present

## 2017-02-10 DIAGNOSIS — R739 Hyperglycemia, unspecified: Secondary | ICD-10-CM | POA: Diagnosis present

## 2017-02-10 DIAGNOSIS — Z79899 Other long term (current) drug therapy: Secondary | ICD-10-CM | POA: Diagnosis not present

## 2017-02-10 DIAGNOSIS — Z7951 Long term (current) use of inhaled steroids: Secondary | ICD-10-CM | POA: Diagnosis not present

## 2017-02-10 MED ORDER — FUROSEMIDE 10 MG/ML IJ SOLN
40.0000 mg | Freq: Two times a day (BID) | INTRAMUSCULAR | Status: DC
  Administered 2017-02-10 – 2017-02-11 (×3): 40 mg via INTRAVENOUS
  Filled 2017-02-10 (×2): qty 4

## 2017-02-10 NOTE — Progress Notes (Signed)
02/10/2017  SATURATION QUALIFICATIONS: (This note is used to comply with regulatory documentation for home oxygen)  Patient Saturations on Room Air at Rest = 90%  Patient Saturations on Room Air while Ambulating = 84%  Patient Saturations on 2 Liters of oxygen while Ambulating = 94%  Please briefly explain why patient needs home oxygen:  Pt desaturates while walking on RA and it takes >3 mins of standing rest break for her to recover back into the 90s at this time.   Barbarann Ehlers [place], [place], DPT [phone]

## 2017-02-10 NOTE — Progress Notes (Signed)
PROGRESS NOTE                                                                                                                                                                                                             Patient Demographics:    [name], is a 61 y.o. female, DOB - 27-Jun-1956, LNL:[id number]  Admit date - 02/08/2017   Admitting Physician Truett Mainland, DO  Outpatient Primary MD for the patient is Cathlean Cower, MD  LOS - 0  Chief Complaint  Patient presents with  . Shortness of Breath       Brief Narrative   [name] is a 61 y.o. female  COPD not on home O2,  CHF, CAD, HTN, HLD, prior tobacco abuse, history of a 5 mm solitary lung nodule, presenting to the ED with worsening shortness of breath with productive thick grey cough following a recent "cold". This is the second admission since February of this year with similar complaints, she was diagnosed with COPD exacerbation and admitted.   Subjective:    [name] today has, No headache, No chest pain, No abdominal pain - No Nausea, No new weakness tingling or numbness, No Cough - improved SOB.    Assessment  & Plan :     1.Acute hypoxic respiratory failure diagnosed upon admission due to COPD exacerbation. Patient is improving, she claims she quit smoking 2 months ago and was counseled to continue to abstain. Added flutter valve for pulmonary toiletry, continue IV Solu-Medrol, oral azithromycin along with nebulizer treatments and oxygen as needed, increase activity.  2. History of 5 mm lung nodule per CT scan. Follow with PCP and outpatient pulmonary.  3. Hypertension. On Coreg, Imdur, HCTZ and Norvasc combination continue.  4. History of CAD along with mild acute on chronic diastolic CHF lEF 40%. Minimal rales on exam, BNP stable, continue gentle lasix. For now continue Coreg, Imdur and statin for secondary prevention.Post discharge  follow with Dr. Marlou Porch.  5. Dyslipidemia. On statin.  6. Smoking-induced polycythemia. Follow with PCP.    Diet : Diet heart healthy/carb modified Room service appropriate? Yes; Fluid consistency: Thin; Fluid restriction: 1500 mL Fluid    Family Communication  : None  Code Status :  Full  Disposition Plan  :  Home 1-2 days  Consults  :  None  Procedures  :    TTE - Left ventricle: The cavity size was normal. Systolic function was normal. The estimated ejection fraction was in the range of 60% to 65%. Wall motion was normal; there were no regional wall motion abnormalities. Features are consistent with a pseudonormal left ventricular filling pattern, with concomitant abnormal relaxation and increased filling pressure (grade 2 diastolic dysfunction). Doppler parameters are consistent with high ventricular filling pressure. - Aortic valve: Trileaflet; normal thickness, mildly calcified leaflets. - Mitral valve: There was trivial regurgitation. - Pulmonary arteries: PA peak pressure: 32 mm Hg (S).  DVT Prophylaxis  :  Lovenox   Lab Results  Component Value Date   PLT 173 02/09/2017    Inpatient Medications  Scheduled Meds: . albuterol  2.5 mg Nebulization TID  . amLODipine  5 mg Oral Daily  . aspirin EC  81 mg Oral Daily  . azithromycin  250 mg Oral Daily  . carvedilol  12.5 mg Oral BID WC  . enoxaparin (LOVENOX) injection  40 mg Subcutaneous Q24H  . fluticasone  2 spray Each Nare Daily  . guaiFENesin  600 mg Oral BID  . isosorbide mononitrate  90 mg Oral Daily  . methylPREDNISolone (SOLU-MEDROL) injection  60 mg Intravenous Q8H  . rosuvastatin  20 mg Oral q1800  . senna  1 tablet Oral BID  . tiotropium  18 mcg Inhalation Daily   Continuous Infusions:  PRN Meds:.acetaminophen, albuterol, cyclobenzaprine, hydrochlorothiazide, HYDROcodone-homatropine, [DISCONTINUED] ondansetron **OR** ondansetron (ZOFRAN) IV, polyethylene glycol  Antibiotics  :    Anti-infectives     Start     Dose/Rate Route Frequency Ordered Stop   02/09/17 1030  azithromycin (ZITHROMAX) tablet 250 mg     250 mg Oral Daily 02/09/17 1028     02/08/17 1300  azithromycin (ZITHROMAX) 500 mg in dextrose 5 % 250 mL IVPB  Status:  Discontinued     500 mg 250 mL/hr over 60 Minutes Intravenous Every 24 hours 02/08/17 1202 02/09/17 1028         Objective:   Vitals:   02/09/17 1838 02/09/17 2053 02/09/17 2121 02/10/17 0852  BP: (!) 122/53 117/60    Pulse: 70 (!) 57    Resp:  18    Temp:  97.7 F (36.5 C)    TempSrc:  Oral    SpO2:   96% 96%  Weight:      Height:        Wt Readings from Last 3 Encounters:  02/08/17 76.7 kg (169 lb)  02/04/17 76.7 kg (169 lb)  12/09/16 71.7 kg (158 lb)    No intake or output data in the 24 hours ending 02/10/17 0931   Physical Exam  Awake Alert, Oriented X 3, No new F.N deficits, Normal affect [place].AT,PERRAL Supple Neck,No JVD, No cervical lymphadenopathy appriciated.  Symmetrical Chest wall movement, Good air movement bilaterally, Minimal rales and wheezes RRR,No Gallops,Rubs or new Murmurs, No Parasternal Heave +ve B.Sounds, Abd Soft, No tenderness, No organomegaly appriciated, No rebound - guarding or rigidity. No Cyanosis, Clubbing or edema, No new Rash or bruise     Data Review:    CBC  Recent Labs Lab 02/08/17 0651 02/09/17 0749  WBC 11.4* 13.2*  HGB 17.2* 16.8*  HCT 52.6* 52.1*  PLT 168 173  MCV 88.9 89.7  MCH 29.1 28.9  MCHC 32.7 32.2  RDW 14.8 14.5    Chemistries   Recent Labs Lab 02/08/17 0651 02/09/17 0749  NA 139 138  K 3.9 4.5  CL  97* 98*  CO2 31 32  GLUCOSE 103* 163*  BUN 10 11  CREATININE 0.49 0.46  CALCIUM 9.4 9.1   ------------------------------------------------------------------------------------------------------------------ No results for input(s): CHOL, HDL, LDLCALC, TRIG, CHOLHDL, LDLDIRECT in the last 72 hours.  Lab Results  Component Value Date   HGBA1C 6.1 (H) 12/10/2016    ------------------------------------------------------------------------------------------------------------------ No results for input(s): TSH, T4TOTAL, T3FREE, THYROIDAB in the last 72 hours.  Invalid input(s): FREET3 ------------------------------------------------------------------------------------------------------------------ No results for input(s): VITAMINB12, FOLATE, FERRITIN, TIBC, IRON, RETICCTPCT in the last 72 hours.  Coagulation profile No results for input(s): INR, PROTIME in the last 168 hours.  No results for input(s): DDIMER in the last 72 hours.  Cardiac Enzymes No results for input(s): CKMB, TROPONINI, MYOGLOBIN in the last 168 hours.  Invalid input(s): CK ------------------------------------------------------------------------------------------------------------------    Component Value Date/Time   BNP 65.1 12/08/2016 2350    Micro Results Recent Results (from the past 240 hour(s))  Culture, expectorated sputum-assessment     Status: None   Collection Time: 02/08/17  9:23 PM  Result Value Ref Range Status   Specimen Description SPUTUM  Final   Special Requests NONE  Final   Sputum evaluation   Final    Sputum specimen not acceptable for testing.  Please recollect.   RESULT CALLED TO, READ BACK BY AND VERIFIED WITH: R LIGHT 02/09/17 @ 0740 M VESTAL    Report Status 02/09/2017 FINAL  Final    Radiology Reports Dg Chest 2 View  Result Date: 02/10/2017 CLINICAL DATA:  Cough and shortness of breath for 2 weeks EXAM: CHEST  2 VIEW COMPARISON:  02/08/2017 FINDINGS: Cardiac shadow is stable. Postoperative changes are again seen. Mild interstitial changes are noted without evidence of focal infiltrate or sizable effusion. No bony abnormality is noted. IMPRESSION: Chronic changes without acute abnormality. Electronically Signed   By: Inez Catalina M.D.   On: 02/10/2017 08:57   Dg Chest 2 View  Result Date: 02/08/2017 CLINICAL DATA:  Recently treated for upper  respiratory infection. Oxygen levels at 80 10 90% for 2 days. Shortness of breath, productive cough since Tuesday and weakness. History of coronary artery disease, CHF, COPD, bronchitis, hypertension. EXAM: CHEST  2 VIEW COMPARISON:  Chest x-ray dated 12/08/2016 and chest x-ray dated 08/30/2015. FINDINGS: Median sternotomy wires appear intact and stable in alignment. Surgical changes of CABG. Atherosclerotic changes noted at the aortic arch. Lungs are clear. Coarse interstitial lung markings are stable. Chronic bronchitic changes noted centrally. No confluent opacity to suggest a developing pneumonia. No pleural effusion or pneumothorax seen. No acute or suspicious osseous finding. IMPRESSION: 1. No active cardiopulmonary disease. No evidence of pneumonia or pulmonary edema. 2. Probable chronic bronchitic changes and chronic interstitial lung disease. 3. Aortic atherosclerosis. Electronically Signed   By: Franki Cabot M.D.   On: 02/08/2017 07:41    Time Spent in minutes  30   Lala Lund M.D on 02/10/2017 at 9:31 AM  Between 7am to 7pm - Pager - [phone] ( page via [facility], text pages only, please mention full 10 digit call back number). After 7pm go to [email or web address] - password [email or web address]

## 2017-02-10 NOTE — Evaluation (Signed)
Physical Therapy Evaluation Patient Details Name: [name] MRN: [id number] DOB: [birth year] Today's Date: 02/10/2017   History of Present Illness  61 y.o. female admitted to [facility] on 02/08/17 for SOB.  Dx with acute hypoxic respiratory failure due to COPD exacerbation.  Pt with significant PMHx of HTN, heart disease, CAD, COPD, CHF, CAD, and CABG.    Clinical Impression  Pt is generally independent with her mobility, however, her activity tolerance is very poor and she desats on RA with gait (see separate note).  She would benefit from an outpatient pulmonary rehab program if she qualifies and may need O2 at home temporarily.  PT will follow acutely to help progress activity and wean O2 during gait if possible.      Follow Up Recommendations Other (comment) (outpatient pulmonary rehab?)    Equipment Recommendations  Other (comment) (possibly home O2)    Recommendations for Other Services   NA    Precautions / Restrictions Precautions Precautions: Other (comment) Precaution Comments: monitor O2 sats.       Mobility  Bed Mobility Overal bed mobility: Independent                Transfers Overall transfer level: Independent Equipment used: None                Ambulation/Gait Ambulation/Gait assistance: Independent Ambulation Distance (Feet): 1000 Feet Assistive device: None Gait Pattern/deviations: WFL(Within Functional Limits) Gait velocity: decreased (educated on slowing gait speed and not walking and talking )   General Gait Details: Pt with good, steady gait pattern, however, she has significant DOE with gait and sats decreased after ~130' on RA.  She could get it back up slowly >3 mins wiht standing rest break and pursed lip breathing, but would drop again within 100'.  On 2 L O2 [place] she could maintain sats in the mid 90s with less DOE.  She could maintain sats at 90 with gait at 1 L O2 [place].  She cannot walk and talk due to increased DOE and drop in O2 sats.           Balance Overall balance assessment: Independent;No apparent balance deficits (not formally assessed)                                           Pertinent Vitals/Pain Pain Assessment: No/denies pain    Home Living Family/patient expects to be discharged to:: Private residence Living Arrangements: Alone Available Help at Discharge: Family;Available PRN/intermittently (family checks in regularly, sons and sister) Type of Home: House Home Access: Level entry     Home Layout: One level Home Equipment: None Additional Comments: was not on O2 at home PTA and would like to try to avoid it now.     Prior Function Level of Independence: Independent         Comments: works full time at a desk job and drives, admits to being generally inactive and enjoys cooking for family.      Hand Dominance   Dominant Hand: Right    Extremity/Trunk Assessment   Upper Extremity Assessment Upper Extremity Assessment: Overall WFL for tasks assessed    Lower Extremity Assessment Lower Extremity Assessment: Overall WFL for tasks assessed    Cervical / Trunk Assessment Cervical / Trunk Assessment: Normal  Communication   Communication: No difficulties  Cognition Arousal/Alertness: Awake/alert Behavior During Therapy: WFL for tasks assessed/performed Overall  Cognitive Status: Within Functional Limits for tasks assessed                                        General Comments General comments (skin integrity, edema, etc.): I spoke to pt re: energy conservation, walking slower and the benefits of a routine exercise program.     Exercises Other Exercises Other Exercises: IS x 10 reps with 1-2 min rest break after 5 reps, max 750 mL, flutter valve x 10 breaths with instructions to do both every hour.    Assessment/Plan    PT Assessment Patient needs continued PT services  PT Problem List Decreased knowledge of precautions;Cardiopulmonary status  limiting activity;Decreased activity tolerance       PT Treatment Interventions Gait training;Functional mobility training;Therapeutic activities;Therapeutic exercise    PT Goals (Current goals can be found in the Care Plan section)  Acute Rehab PT Goals Patient Stated Goal: wants to avoid going home with oxygen.  PT Goal Formulation: With patient Time For Goal Achievement: 02/24/17 Potential to Achieve Goals: Good    Frequency Min 3X/week           End of Session Equipment Utilized During Treatment: Oxygen Activity Tolerance: Patient limited by fatigue Patient left: in bed (seated EOB) Nurse Communication: Mobility status;Other (comment) (decreased O2 sats on RA) PT Visit Diagnosis: Difficulty in walking, not elsewhere classified (R26.2) (decreased activity tolerance)    Time: 1007-1219 PT Time Calculation (min) (ACUTE ONLY): 47 min   Charges:   PT Evaluation $PT Eval Moderate Complexity: 1 Procedure PT Treatments $Gait Training: 8-22 mins $Self Care/Home Management: 8-22      Berklie Dethlefs B. [place], [place], DPT [phone]   02/10/2017, 4:30 PM

## 2017-02-11 DIAGNOSIS — J441 Chronic obstructive pulmonary disease with (acute) exacerbation: Secondary | ICD-10-CM

## 2017-02-11 LAB — BASIC METABOLIC PANEL
Anion gap: 9 (ref 5–15)
BUN: 16 mg/dL (ref 6–20)
CALCIUM: 9.1 mg/dL (ref 8.9–10.3)
CO2: 36 mmol/L — ABNORMAL HIGH (ref 22–32)
CREATININE: 0.55 mg/dL (ref 0.44–1.00)
Chloride: 96 mmol/L — ABNORMAL LOW (ref 101–111)
GFR calc Af Amer: >60 mL/min (ref >60)
GFR calc non Af Amer: >60 mL/min (ref >60)
GLUCOSE: 189 mg/dL — AB (ref 65–99)
Potassium: 4.1 mmol/L (ref 3.5–5.1)
Sodium: 141 mmol/L (ref 135–145)

## 2017-02-11 MED ORDER — POTASSIUM CHLORIDE ER 10 MEQ PO TBCR: 10.0000 meq | EXTENDED_RELEASE_TABLET | Freq: Every day | ORAL | 0 refills | Status: DC

## 2017-02-11 MED ORDER — PREDNISONE 5 MG PO TABS: ORAL_TABLET | ORAL | 0 refills | Status: DC

## 2017-02-11 MED ORDER — FUROSEMIDE 10 MG/ML IJ SOLN
60.0000 mg | Freq: Two times a day (BID) | INTRAMUSCULAR | Status: DC
  Administered 2017-02-11: 60 mg via INTRAVENOUS
  Filled 2017-02-11 (×2): qty 6

## 2017-02-11 MED ORDER — AZITHROMYCIN 250 MG PO TABS: 250.0000 mg | ORAL_TABLET | Freq: Every day | ORAL | 0 refills | Status: DC

## 2017-02-11 MED ORDER — FUROSEMIDE 40 MG PO TABS: 40.0000 mg | ORAL_TABLET | Freq: Two times a day (BID) | ORAL | 0 refills | Status: DC

## 2017-02-11 NOTE — Care Management Note (Addendum)
Case Management Note  Patient Details  Name: [name] MRN: [id number] Date of Birth: October 12, 1956  Subjective/Objective:   61 yr old female admitted for COPD exacerbation. Patient will need oxygen for home based on assessment.  Patient Saturations on Room Air at Rest = 90%  Patient Saturations on [employer] while Ambulating = 84%  Patient Saturations on 2 Liters of oxygen while Ambulating = 94%    Pt desaturates while walking on RA and it takes >3 mins of standing rest break for her to recover back into the 90s at this time.            Action/Plan:  Case manager spoke with patient concerning her need for home oxygen, explained that tank will be delivered to her room and [facility] will contact her to deliver to her home.   Case manager called request for oxygen for Home use to [place], [place]. Patient will have family support at discharge.  Expected Discharge Date:  02/11/17               Expected Discharge Plan:   Home/self Care   In-House Referral:     Discharge planning Services  CM Consult  Post Acute Care Choice:  Durable Medical Equipment Choice offered to:  Patient  DME Arranged:  Oxygen DME Agency:  [place]:  NA [place] Agency:  NA  Status of Service:  Completed, signed off  If discussed at [place] of Stay Meetings, dates discussed:    Additional Comments:  Ninfa Meeker, RN 02/11/2017, 3:37 PM

## 2017-02-11 NOTE — Discharge Instructions (Signed)
Follow with Primary MD Cathlean Cower, MD in 7 days   Get CBC, CMP, 2 view Chest X ray checked  by Primary MD or SNF MD in 5-7 days ( we routinely change or add medications that can affect your baseline labs and fluid status, therefore we recommend that you get the mentioned basic workup next visit with your PCP, your PCP may decide not to get them or add new tests based on their clinical decision)  Activity: As tolerated with Full fall precautions use walker/cane & assistance as needed  Disposition Home    Diet:   Diet heart healthy/carb modified  Fluid restriction: 1500 mL Fluid   For Heart failure patients - Check your Weight same time everyday, if you gain over 2 pounds, or you develop in leg swelling, experience more shortness of breath or chest pain, call your Primary MD immediately. Follow Cardiac Low Salt Diet and 1.5 lit/day fluid restriction.  On your next visit with your primary care physician please Get Medicines reviewed and adjusted.  Please request your Prim.MD to go over all Hospital Tests and Procedure/Radiological results at the follow up, please get all Hospital records sent to your Prim MD by signing hospital release before you go home.  If you experience worsening of your admission symptoms, develop shortness of breath, life threatening emergency, suicidal or homicidal thoughts you must seek medical attention immediately by calling 911 or calling your MD immediately  if symptoms less severe.  You Must read complete instructions/literature along with all the possible adverse reactions/side effects for all the Medicines you take and that have been prescribed to you. Take any new Medicines after you have completely understood and accpet all the possible adverse reactions/side effects.   Do not drive, operate heavy machinery, perform activities at heights, swimming or participation in water activities or provide baby sitting services if your were admitted for syncope or siezures  until you have seen by Primary MD or a Neurologist and advised to do so again.  Do not drive when taking Pain medications.    Do not take more than prescribed Pain, Sleep and Anxiety Medications  Special Instructions: If you have smoked or chewed Tobacco  in the last 2 yrs please stop smoking, stop any regular Alcohol  and or any Recreational drug use.  Wear Seat belts while driving.   Please note  You were cared for by a hospitalist during your hospital stay. If you have any questions about your discharge medications or the care you received while you were in the hospital after you are discharged, you can call the unit and asked to speak with the hospitalist on call if the hospitalist that took care of you is not available. Once you are discharged, your primary care physician will handle any further medical issues. Please note that NO REFILLS for any discharge medications will be authorized once you are discharged, as it is imperative that you return to your primary care physician (or establish a relationship with a primary care physician if you do not have one) for your aftercare needs so that they can reassess your need for medications and monitor your lab values.

## 2017-02-11 NOTE — Progress Notes (Signed)
Pt ready for discharge. Pt states she drove herself to the hospital but is putting a call out to her son to come to hospital and meet her. Pt demonstrates no s/s of distress. All d/c information reviewed with pt.

## 2017-02-11 NOTE — Discharge Summary (Signed)
LYNANNE DELGRECO ZDG:[id number] DOB: 21-Mar-1956 DOA: 02/08/2017  PCP: Cathlean Cower, MD  Admit date: 02/08/2017  Discharge date: 02/11/2017  Admitted From: Home   Disposition:  Home   Recommendations for Outpatient Follow-up:   Follow up with PCP in 1-2 weeks  PCP Please obtain BMP/CBC, 2 view CXR in 1week,  (see Discharge instructions)   PCP Please follow up on the following pending results: The weight, BMP and diuretic dose closely   Home Health: None   Equipment/Devices: Oxygen Consultations: None Discharge Condition: Stable  CODE STATUS: Full  Diet Recommendation: Diet heart healthy/carb modified  Fluid restriction: 1500 mL Fluid     Chief Complaint  Patient presents with  . Shortness of Breath     Brief history of present illness from the day of admission and additional interim summary     Tresha Muzio Burgessis a 61 y.o.femaleCOPD not on home O2, CHF, CAD, HTN, HLD, prior tobacco abuse, history of a 5 mm solitary lung nodule, presenting to the ED with worsening shortness of breath with productive thick grey cough following a recent "cold". This is the second admission since February of this year with similar complaints, she was diagnosed with COPD & CHF exacerbation and admitted.                                                                 Hospital Course    1. Acute hypoxic respiratory failure diagnosed upon admission due to COPD exacerbation. Patient is improving, she claims she quit smoking 2 months ago and was counseled to continue to abstain. She was kept on IV steroids, oral azithromycin and oxygen and breathing treatments, majority of improvement happened after she was diuresed and I think much of her shortness of breath was mostly due to underlying acute on chronic diastolic CHF, kindly see #4  below.  2. History of 5 mm lung nodule per CT scan. Follow with PCP and outpatient pulmonary.  3. Hypertension. On Coreg, Imdur, Lasix and Norvasc combination continue.  4. History of CAD along with mild acute on chronic diastolic CHF lEF 59%. Despite stable BNP she had significant rales on exam and evidence of trace edema, her clinical scenario considerably improved after she was given moderate dose IV Lasix, she is feeling much better this morning, will be placed on oral Lasix, she does qualify for home oxygen which will be provided,.Post discharge follow with Dr. Marlou Porch. [facility] PCP to monitor weight, BMP and diuretic dose closely.  5. Dyslipidemia. On statin.  6. Smoking-induced polycythemia. Follow with PCP.   Discharge diagnosis     Active Problems:   Headache   Polycythemia   Essential hypertension   COPD (chronic obstructive pulmonary disease) with chronic bronchitis (HCC)   CAD (coronary artery disease)   Acute respiratory failure with hypoxia (  HCC)   GERD (gastroesophageal reflux disease)   Hypersomnolence   COPD with acute exacerbation (HCC)   COPD exacerbation (HCC)   Left lumbar radiculopathy    Discharge instructions    Discharge Instructions    Discharge instructions    Complete by:  As directed    Follow with Primary MD Cathlean Cower, MD in 7 days   Get CBC, CMP, 2 view Chest X ray checked  by Primary MD or SNF MD in 5-7 days ( we routinely change or add medications that can affect your baseline labs and fluid status, therefore we recommend that you get the mentioned basic workup next visit with your PCP, your PCP may decide not to get them or add new tests based on their clinical decision)  Activity: As tolerated with Full fall precautions use walker/cane & assistance as needed  Disposition Home    Diet:   Diet heart healthy/carb modified  Fluid restriction: 1500 mL Fluid   For Heart failure patients - Check your Weight same time everyday, if you gain  over 2 pounds, or you develop in leg swelling, experience more shortness of breath or chest pain, call your Primary MD immediately. Follow Cardiac Low Salt Diet and 1.5 lit/day fluid restriction.  On your next visit with your primary care physician please Get Medicines reviewed and adjusted.  Please request your Prim.MD to go over all Hospital Tests and Procedure/Radiological results at the follow up, please get all Hospital records sent to your Prim MD by signing hospital release before you go home.  If you experience worsening of your admission symptoms, develop shortness of breath, life threatening emergency, suicidal or homicidal thoughts you must seek medical attention immediately by calling 911 or calling your MD immediately  if symptoms less severe.  You Must read complete instructions/literature along with all the possible adverse reactions/side effects for all the Medicines you take and that have been prescribed to you. Take any new Medicines after you have completely understood and accpet all the possible adverse reactions/side effects.   Do not drive, operate heavy machinery, perform activities at heights, swimming or participation in water activities or provide baby sitting services if your were admitted for syncope or siezures until you have seen by Primary MD or a Neurologist and advised to do so again.  Do not drive when taking Pain medications.    Do not take more than prescribed Pain, Sleep and Anxiety Medications  Special Instructions: If you have smoked or chewed Tobacco  in the last 2 yrs please stop smoking, stop any regular Alcohol  and or any Recreational drug use.  Wear Seat belts while driving.   Please note  You were cared for by a hospitalist during your hospital stay. If you have any questions about your discharge medications or the care you received while you were in the hospital after you are discharged, you can call the unit and asked to speak with the  hospitalist on call if the hospitalist that took care of you is not available. Once you are discharged, your primary care physician will handle any further medical issues. Please note that NO REFILLS for any discharge medications will be authorized once you are discharged, as it is imperative that you return to your primary care physician (or establish a relationship with a primary care physician if you do not have one) for your aftercare needs so that they can reassess your need for medications and monitor your lab values.   Increase activity slowly  Complete by:  As directed       Discharge Medications   Allergies as of 02/11/2017   No Known Allergies     Medication List    STOP taking these medications   hydrochlorothiazide 12.5 MG capsule Commonly known as:  MICROZIDE   levofloxacin 500 MG tablet Commonly known as:  LEVAQUIN   predniSONE 10 MG tablet Commonly known as:  DELTASONE Replaced by:  predniSONE 5 MG tablet     TAKE these medications   acetaminophen 500 MG tablet Commonly known as:  TYLENOL Take 1,000 mg by mouth at bedtime as needed for headache.   albuterol 108 (90 Base) MCG/ACT inhaler Commonly known as:  PROVENTIL HFA INHALE 1-2 PUFFS INTO THE LUNGS EVERY 6 (SIX) HOURS AS NEEDED FOR WHEEZING OR SHORTNESS OF BREATH.   amLODipine 5 MG tablet Commonly known as:  NORVASC TAKE 1 TABLET BY MOUTH EVERY DAY   aspirin EC 81 MG tablet Take 1 tablet (81 mg total) by mouth daily.   azithromycin 250 MG tablet Commonly known as:  ZITHROMAX Take 1 tablet (250 mg total) by mouth daily.   carvedilol 12.5 MG tablet Commonly known as:  COREG TAKE 1 TABLET (12.5 MG TOTAL) BY MOUTH 2 (TWO) TIMES DAILY.   cyclobenzaprine 5 MG tablet Commonly known as:  FLEXERIL Take 1 tablet (5 mg total) by mouth 3 (three) times daily as needed for muscle spasms.   fluticasone 50 MCG/ACT nasal spray Commonly known as:  FLONASE PLACE 2 SPRAYS INTO BOTH NOSTRILS DAILY. What changed:   See the new instructions.   furosemide 40 MG tablet Commonly known as:  LASIX Take 1 tablet (40 mg total) by mouth 2 (two) times daily.   HYDROcodone-acetaminophen 10-325 MG tablet Commonly known as:  NORCO Take 1 tablet by mouth every 6 (six) hours as needed.   HYDROcodone-homatropine 5-1.5 MG/5ML syrup Commonly known as:  HYCODAN Take 5 mLs by mouth every 6 (six) hours as needed for cough.   isosorbide mononitrate 60 MG 24 hr tablet Commonly known as:  IMDUR TAKE 1 1/2 TABLET BY MOUTH DAILY   potassium chloride 10 MEQ tablet Commonly known as:  K-DUR Take 1 tablet (10 mEq total) by mouth daily.   predniSONE 5 MG tablet Commonly known as:  DELTASONE Label  & dispense according to the schedule below. Take 8 Pills PO for 3 days, 6 Pills PO for 3 days, 4 Pills PO for 3 days, 2 Pills PO for 3 days, 1 Pills PO for 3 days, 1/2 Pill  PO for 3 days then STOP. Total 65 pills. Replaces:  predniSONE 10 MG tablet   rosuvastatin 20 MG tablet Commonly known as:  CRESTOR Take 1 tablet (20 mg total) by mouth daily.   SPIRIVA HANDIHALER 18 MCG inhalation capsule Generic drug:  tiotropium PLACE 1 CAPSULE INTO INHALER AND INHALE DAILY.            Durable Medical Equipment        Start     Ordered   02/11/17 0815  For home use only DME oxygen  Once    Question Answer Comment  Mode or (Route) Nasal cannula   Liters per Minute 2   Frequency Continuous (stationary and portable oxygen unit needed)   Oxygen conserving device Yes   Oxygen delivery system Gas      02/11/17 0814      Follow-up Information    Cathlean Cower, MD. Schedule an appointment as soon as possible for a visit  in 1 week(s).   Specialties:  Internal Medicine, Radiology Contact information: [place] [address] [phone]        Candee Furbish, MD. Schedule an appointment as soon as possible for a visit in 1 week(s).   Specialty:  Cardiology Why:  CHF Contact information: [address] [place] [address] [phone]        Rigoberto Noel., MD. Schedule an appointment as soon as possible for a visit in 2 week(s).   Specialty:  Pulmonary Disease Why:  Lung Nodule Contact information: [address] [address] [phone]           Major procedures and Radiology Reports - PLEASE review detailed and final reports thoroughly  -     TTE - Left ventricle: The cavity size was normal. Systolic function wasnormal. The estimated ejection fraction was in the range of 60%to 65%. Wall motion was normal; there were no regional wallmotion abnormalities. Features are consistent with a pseudonormalleft ventricular filling pattern, with concomitant abnormalrelaxation and increased filling pressure (grade 2 diastolicdysfunction). Doppler parameters are consistent with highventricular filling pressure. - Aortic valve: Trileaflet; normal thickness, mildly calcifiedleaflets. - Mitral valve: There was trivial regurgitation. - Pulmonary arteries: PA peak pressure: 32 mm Hg (S).    Dg Chest 2 View  Result Date: 02/10/2017 CLINICAL DATA:  Cough and shortness of breath for 2 weeks EXAM: CHEST  2 VIEW COMPARISON:  02/08/2017 FINDINGS: Cardiac shadow is stable. Postoperative changes are again seen. Mild interstitial changes are noted without evidence of focal infiltrate or sizable effusion. No bony abnormality is noted. IMPRESSION: Chronic changes without acute abnormality. Electronically Signed   By: Inez Catalina M.D.   On: 02/10/2017 08:57   Dg Chest 2 View  Result Date: 02/08/2017 CLINICAL DATA:  Recently treated for upper respiratory infection. Oxygen levels at 80 10 90% for 2 days. Shortness of breath, productive cough since Tuesday and weakness. History of coronary artery disease, CHF, COPD, bronchitis, hypertension. EXAM: CHEST  2 VIEW COMPARISON:  Chest x-ray dated 12/08/2016 and chest x-ray dated 08/30/2015. FINDINGS: Median sternotomy  wires appear intact and stable in alignment. Surgical changes of CABG. Atherosclerotic changes noted at the aortic arch. Lungs are clear. Coarse interstitial lung markings are stable. Chronic bronchitic changes noted centrally. No confluent opacity to suggest a developing pneumonia. No pleural effusion or pneumothorax seen. No acute or suspicious osseous finding. IMPRESSION: 1. No active cardiopulmonary disease. No evidence of pneumonia or pulmonary edema. 2. Probable chronic bronchitic changes and chronic interstitial lung disease. 3. Aortic atherosclerosis. Electronically Signed   By: Franki Cabot M.D.   On: 02/08/2017 07:41    Micro Results     Recent Results (from the past 240 hour(s))  Culture, expectorated sputum-assessment     Status: None   Collection Time: 02/08/17  9:23 PM  Result Value Ref Range Status   Specimen Description SPUTUM  Final   Special Requests NONE  Final   Sputum evaluation   Final    Sputum specimen not acceptable for testing.  Please recollect.   RESULT CALLED TO, READ BACK BY AND VERIFIED WITH: R LIGHT 02/09/17 @ 0740 M VESTAL    Report Status 02/09/2017 FINAL  Final    Today   Subjective    Maaliyah Adolph today has no headache,no chest abdominal pain,no new weakness tingling or numbness, feels much better wants to go home today.     Objective   Blood pressure Marland Kitchen)  114/53, pulse (!) 56, temperature 97.5 F (36.4 C), temperature source Oral, resp. rate 16, height 5' (1.524 m), weight 76.7 kg (169 lb), SpO2 95 %.   Intake/Output Summary (Last 24 hours) at 02/11/17 1031 Last data filed at 02/10/17 1700  Gross per 24 hour  Intake              480 ml  Output                0 ml  Net              480 ml    Exam Awake Alert, Oriented x 3, No new F.N deficits, Normal affect [place].AT,PERRAL Supple Neck,No JVD, No cervical lymphadenopathy appriciated.  Symmetrical Chest wall movement, Good air movement bilaterally, minimal rales RRR,No Gallops,Rubs or new  Murmurs, No Parasternal Heave +ve B.Sounds, Abd Soft, Non tender, No organomegaly appriciated, No rebound -guarding or rigidity. No Cyanosis, Clubbing or edema, No new Rash or bruise   Data Review   CBC w Diff: Lab Results  Component Value Date   WBC 13.2 (H) 02/09/2017   HGB 16.8 (H) 02/09/2017   HGB 15.7 06/12/2014   HCT 52.1 (H) 02/09/2017   HCT 48.3 (H) 06/12/2014   PLT 173 02/09/2017   PLT 178 06/12/2014   LYMPHOPCT 30.1 10/19/2015   MONOPCT 6.0 10/19/2015   EOSPCT 2.9 10/19/2015   BASOPCT 0.9 10/19/2015    CMP: Lab Results  Component Value Date   NA 141 02/11/2017   NA 142 06/12/2014   K 4.1 02/11/2017   K 3.6 06/12/2014   CL 96 (L) 02/11/2017   CL 105 06/12/2014   CO2 36 (H) 02/11/2017   CO2 29 06/12/2014   BUN 16 02/11/2017   BUN 12 06/12/2014   CREATININE 0.55 02/11/2017   CREATININE 0.62 06/12/2014   PROT 7.3 10/19/2015   PROT 7.6 06/12/2014   ALBUMIN 4.3 10/19/2015   ALBUMIN 3.9 06/12/2014   BILITOT 0.6 10/19/2015   BILITOT 0.6 06/12/2014   ALKPHOS 92 10/19/2015   ALKPHOS 117 (H) 06/12/2014   AST 23 10/19/2015   AST 32 06/12/2014   ALT 36 (H) 10/19/2015   ALT 38 06/12/2014  .   Total Time in preparing paper work, data evaluation and todays exam - 63 minutes  Lala Lund M.D on 02/11/2017 at 10:31 AM  [facility]   Office  [phone]

## 2017-02-17 ENCOUNTER — Encounter: Payer: Self-pay | Admitting: Cardiology

## 2017-02-17 ENCOUNTER — Ambulatory Visit (INDEPENDENT_AMBULATORY_CARE_PROVIDER_SITE_OTHER): Payer: Commercial Managed Care - HMO | Admitting: Cardiology

## 2017-02-17 VITALS — BP 130/60 | HR 55 | Ht 60.0 in | Wt 169.0 lb

## 2017-02-17 DIAGNOSIS — I209 Angina pectoris, unspecified: Secondary | ICD-10-CM | POA: Diagnosis not present

## 2017-02-17 DIAGNOSIS — Z720 Tobacco use: Secondary | ICD-10-CM

## 2017-02-17 DIAGNOSIS — Z951 Presence of aortocoronary bypass graft: Secondary | ICD-10-CM

## 2017-02-17 DIAGNOSIS — I10 Essential (primary) hypertension: Secondary | ICD-10-CM

## 2017-02-17 NOTE — Progress Notes (Signed)
[facility]. [address], [place]  [address] Phone: [phone] Fax:  [phone]  Date:  02/17/2017   ID:  [name], [name] 05/04/1956, MRN [id number]  PCP:  Cathlean Cower, MD   History of Present Illness: [name] is a 61 y.o. female with coronary artery disease status post bypass surgery in 02/2002, LIMA to LAD, RIMA to RCA, SVG to OM with COPD, hypertension here for followup.  Last hospitalization in August 2015 was secondary to chicken bone impaction in midesophagus.  I saw her in the hospital on 07/02/13. She was having hypertensive emergency/headache. Angina was medically managed with both beta blocker and isosorbide as well as statin and aspirin. Troponin was negative. Tobacco cessation encouraged.  At prior visit, her blood pressure was seen to be quite elevated.  Pin prick discomfort periodically in her chest. Does not sound cardiac. She reported other somatic complaints, one night she did drink too much wine and felt poor through the night, did not take her medicines, adding GERD-like sensation. This resolved. She also recalled when she had her bypass surgery in 2003 that her vein grafts would likely stay open for proximal 0.10 years. She is concerned about this. We discussed. Her last stress test in 2014 was overall reassuring.  02/17/17  - Recent hospitalization with COPD exacerbation shortness of breath, no chest pain. BNP was normal. Echocardiogram performed showed normal ejection fraction with grade 2 diastolic dysfunction and mild pulmonary hypertension secondary. Doing better. Still continuing to smoke.  Wt Readings from Last 3 Encounters:  02/17/17 169 lb (76.7 kg)  02/08/17 169 lb (76.7 kg)  02/04/17 169 lb (76.7 kg)     Past Medical History:  Diagnosis Date  . Bronchitis   . CAD (coronary artery disease) 09/03/2013  . CHF (congestive heart failure) ([place])   . COPD (chronic obstructive pulmonary disease) ([place])   . Coronary artery  disease   . Headache   . Heart disease   . Hyperlipidemia   . Hypertension   . Impaired glucose tolerance 08/20/2014  . Sinusitis     Past Surgical History:  Procedure Laterality Date  . CORONARY ARTERY BYPASS GRAFT  2003   Triple bypass  . ESOPHAGOGASTRODUODENOSCOPY Left 06/13/2014   Procedure: ESOPHAGOGASTRODUODENOSCOPY (EGD);  Surgeon: Juanita Craver, MD;  Location: [facility] ENDOSCOPY;  Service: Endoscopy;  Laterality: Left;  . RIGID ESOPHAGOSCOPY N/A 06/12/2014   Procedure: RIGID ESOPHAGOSCOPY WITH FOREIGN BODY REMOVAL;  Surgeon: Ascencion Dike, MD;  Location: [facility] OR;  Service: ENT;  Laterality: N/A;    Current Outpatient Prescriptions  Medication Sig Dispense Refill  . acetaminophen (TYLENOL) 500 MG tablet Take 1,000 mg by mouth at bedtime as needed for headache.    . albuterol (PROVENTIL HFA) 108 (90 Base) MCG/ACT inhaler INHALE 1-2 PUFFS INTO THE LUNGS EVERY 6 (SIX) HOURS AS NEEDED FOR WHEEZING OR SHORTNESS OF BREATH. 6.7 Inhaler 5  . amLODipine (NORVASC) 5 MG tablet TAKE 1 TABLET BY MOUTH EVERY DAY 30 tablet 11  . aspirin EC 81 MG tablet Take 1 tablet (81 mg total) by mouth daily. 30 tablet 0  . azithromycin (ZITHROMAX) 250 MG tablet Take 1 tablet (250 mg total) by mouth daily. 3 each 0  . carvedilol (COREG) 12.5 MG tablet TAKE 1 TABLET (12.5 MG TOTAL) BY MOUTH 2 (TWO) TIMES DAILY. 60 tablet 11  . cyclobenzaprine (FLEXERIL) 5 MG tablet Take 1 tablet (5 mg total) by mouth 3 (three) times daily as needed for  muscle spasms. 60 tablet 1  . fluticasone (FLONASE) 50 MCG/ACT nasal spray PLACE 2 SPRAYS INTO BOTH NOSTRILS DAILY. 16 g 3  . furosemide (LASIX) 40 MG tablet Take 1 tablet (40 mg total) by mouth 2 (two) times daily. 30 tablet 0  . HYDROcodone-acetaminophen (NORCO) 10-325 MG tablet Take 1 tablet by mouth every 6 (six) hours as needed. 60 tablet 0  . isosorbide mononitrate (IMDUR) 60 MG 24 hr tablet TAKE 1 1/2 TABLET BY MOUTH DAILY 45 tablet 0  . potassium chloride (K-DUR) 10 MEQ tablet Take  1 tablet (10 mEq total) by mouth daily. 15 tablet 0  . predniSONE (DELTASONE) 5 MG tablet Label  & dispense according to the schedule below. Take 8 Pills PO for 3 days, 6 Pills PO for 3 days, 4 Pills PO for 3 days, 2 Pills PO for 3 days, 1 Pills PO for 3 days, 1/2 Pill  PO for 3 days then STOP. Total 65 pills. 65 tablet 0  . rosuvastatin (CRESTOR) 20 MG tablet Take 1 tablet (20 mg total) by mouth daily. 90 tablet 3  . SPIRIVA HANDIHALER 18 MCG inhalation capsule PLACE 1 CAPSULE INTO INHALER AND INHALE DAILY. 30 capsule 11   No current facility-administered medications for this visit.     Allergies:   No Known Allergies  Social History:  The patient  reports that she has been smoking Cigarettes.  She has a 20.00 pack-year smoking history. She has never used smokeless tobacco. She reports that she drinks about 3.0 - 3.6 oz of alcohol per week . She reports that she uses drugs, including Marijuana.   Family History  Problem Relation Age of Onset  . Lung cancer Paternal Uncle   . Heart disease Mother   . Brain cancer Mother   . Cancer Maternal Grandmother     colon  . Stroke Other   . Cancer      x 4 aunts  . Lung cancer Maternal Aunt   . Suicidality Brother     ROS:  Unless stated above other review of systems negative.  PHYSICAL EXAM: VS:  BP 130/60   Pulse (!) 55   Ht 5' (1.524 m)   Wt 169 lb (76.7 kg)   SpO2 95%   BMI 33.01 kg/m  GEN: Well nourished, well developed, in no acute distress  HEENT: normal  Neck: no JVD, carotid bruits, or masses Cardiac: RRR; no murmurs, rubs, or gallops,no edema -scar noted Respiratory:  Normal work of breathing, poor air movement bilaterally, no wheezes GI: soft, nontender, nondistended, + BS MS: no deformity or atrophy  Skin: warm and dry, no rash Neuro:  Alert and Oriented x 3, Strength and sensation are intact Psych: euthymic mood, full affect   EKG:    EKG was ordered today. 02/16/16-sinus rhythm, 61, nonspecific T-wave changes  personally viewed-2 other abnormalities. Sinus bradycardia rate 56, occasional PAC, nonspecific ST-T wave changes   ECHO: 02/09/17: - Left ventricle: The cavity size was normal. Systolic function was   normal. The estimated ejection fraction was in the range of 60%   to 65%. Wall motion was normal; there were no regional wall   motion abnormalities. Features are consistent with a pseudonormal   left ventricular filling pattern, with concomitant abnormal   relaxation and increased filling pressure (grade 2 diastolic   dysfunction). Doppler parameters are consistent with high   ventricular filling pressure. - Aortic valve: Trileaflet; normal thickness, mildly calcified   leaflets. - Mitral valve: There was  trivial regurgitation. - Pulmonary arteries: PA peak pressure: 32 mm Hg (S).  ASSESSMENT AND PLAN:  Coronary artery disease  - Stable with no anginal symptoms. Doing well.  Status post CABG  - 2003, stable  Grade 2 diastolic dysfunction  - Goes hand-in-hand with her prior CAD and smoking history as well as age.  - Continue with pressure medications, Lasix.  - BNP and troponin were normal during her recent hospitalization for COPD exacerbation.  Angina  - Well controlled with isosorbide and carvedilol.  COPD  - Recent exacerbation in late April 2018.  Tobacco use  - Once again strongly encourage tobacco cessation.  Bone impaction of esophagus -Dr. Cyndia Bent, Dr. Benjamine Mola. Chicken bone and cartilage removed from midportion of the esophagus. 06/12/14.    Signed, Candee Furbish, MD [facility]  02/17/2017 4:47 PM

## 2017-02-17 NOTE — Patient Instructions (Signed)

## 2017-02-19 ENCOUNTER — Ambulatory Visit (INDEPENDENT_AMBULATORY_CARE_PROVIDER_SITE_OTHER): Payer: Commercial Managed Care - HMO | Admitting: Internal Medicine

## 2017-02-19 ENCOUNTER — Encounter: Payer: Self-pay | Admitting: Internal Medicine

## 2017-02-19 VITALS — BP 116/60 | HR 58

## 2017-02-19 DIAGNOSIS — J441 Chronic obstructive pulmonary disease with (acute) exacerbation: Secondary | ICD-10-CM | POA: Diagnosis not present

## 2017-02-19 DIAGNOSIS — I10 Essential (primary) hypertension: Secondary | ICD-10-CM | POA: Diagnosis not present

## 2017-02-19 DIAGNOSIS — R7302 Impaired glucose tolerance (oral): Secondary | ICD-10-CM

## 2017-02-19 DIAGNOSIS — Z0001 Encounter for general adult medical examination with abnormal findings: Secondary | ICD-10-CM

## 2017-02-19 NOTE — Patient Instructions (Addendum)
Please wear the home oxygen 2L at least at night until you see pulmonary.    Please continue all other medications as before, and refills have been done if requested.  Please have the pharmacy call with any other refills you may need.  Please continue your efforts at being more active, low cholesterol diet, and weight control.  You are otherwise up to date with prevention measures today.  Please keep your appointments with your specialists as you may have planned  Please return in 6 months, or sooner if needed, with Lab testing done 3-5 days before

## 2017-02-19 NOTE — Progress Notes (Signed)
Subjective:    Patient ID: [name], female    DOB: 01/05/1956, 61 y.o.   MRN: [id number]  HPI  Pt here for hosp f/u - Admit date: 02/08/2017  Discharge date: 02/11/2017 with acute resp failure hypoxic, and copd exacerbation, HTN, CAD, chronic diast chf.   Did not have antibiotic with last visit for some reason, though did take the cough med prn and prednisone .  Unfortunatley became worse with sob and wheezing. Already seen per cardiology with contd med treatment  Seen per cardiology, remains on 40 mg lasix and K.  Was d/c on home o2 2L continuous but has a sat monitor and has only used the o2 2L at night with lower sats.  Here without o2.  Pt reports improved shortness of breath with productive thick grey cough following a recent "cold".  Pt has quit smoking since feb 2018 and does not plan to restart.  Polycythemia noted while hospd thought to be related to smoking.  No hx of OSA or primary bone marrow abnormality.  Pt is currently daily improved, and plans to finish her antibx and prednisone as prescribed, and   Has f/u with pulm May 9.  No new complaints.  No further hx Past Medical History:  Diagnosis Date  . Bronchitis   . CAD (coronary artery disease) 09/03/2013  . CHF (congestive heart failure) ([place])   . COPD (chronic obstructive pulmonary disease) ([place])   . Coronary artery disease   . Headache   . Heart disease   . Hyperlipidemia   . Hypertension   . Impaired glucose tolerance 08/20/2014  . Sinusitis    Past Surgical History:  Procedure Laterality Date  . CORONARY ARTERY BYPASS GRAFT  2003   Triple bypass  . ESOPHAGOGASTRODUODENOSCOPY Left 06/13/2014   Procedure: ESOPHAGOGASTRODUODENOSCOPY (EGD);  Surgeon: Juanita Craver, MD;  Location: [facility] ENDOSCOPY;  Service: Endoscopy;  Laterality: Left;  . RIGID ESOPHAGOSCOPY N/A 06/12/2014   Procedure: RIGID ESOPHAGOSCOPY WITH FOREIGN BODY REMOVAL;  Surgeon: Ascencion Dike, MD;  Location: [facility] OR;  Service: ENT;  Laterality: N/A;    reports  that she has been smoking Cigarettes.  She has a 20.00 pack-year smoking history. She has never used smokeless tobacco. She reports that she drinks about 3.0 - 3.6 oz of alcohol per week . She reports that she uses drugs, including Marijuana. family history includes Brain cancer in her mother; Cancer in her maternal grandmother; Heart disease in her mother; Lung cancer in her maternal aunt and paternal uncle; Stroke in her other; Suicidality in her brother. No Known Allergies Current Outpatient Prescriptions on File Prior to Visit  Medication Sig Dispense Refill  . acetaminophen (TYLENOL) 500 MG tablet Take 1,000 mg by mouth at bedtime as needed for headache.    . albuterol (PROVENTIL HFA) 108 (90 Base) MCG/ACT inhaler INHALE 1-2 PUFFS INTO THE LUNGS EVERY 6 (SIX) HOURS AS NEEDED FOR WHEEZING OR SHORTNESS OF BREATH. 6.7 Inhaler 5  . amLODipine (NORVASC) 5 MG tablet TAKE 1 TABLET BY MOUTH EVERY DAY 30 tablet 11  . aspirin EC 81 MG tablet Take 1 tablet (81 mg total) by mouth daily. 30 tablet 0  . carvedilol (COREG) 12.5 MG tablet TAKE 1 TABLET (12.5 MG TOTAL) BY MOUTH 2 (TWO) TIMES DAILY. 60 tablet 11  . cyclobenzaprine (FLEXERIL) 5 MG tablet Take 1 tablet (5 mg total) by mouth 3 (three) times daily as needed for muscle spasms. 60 tablet 1  . fluticasone (FLONASE) 50 MCG/ACT nasal spray  PLACE 2 SPRAYS INTO BOTH NOSTRILS DAILY. 16 g 3  . furosemide (LASIX) 40 MG tablet Take 1 tablet (40 mg total) by mouth 2 (two) times daily. 30 tablet 0  . HYDROcodone-acetaminophen (NORCO) 10-325 MG tablet Take 1 tablet by mouth every 6 (six) hours as needed. 60 tablet 0  . isosorbide mononitrate (IMDUR) 60 MG 24 hr tablet TAKE 1 1/2 TABLET BY MOUTH DAILY 45 tablet 0  . potassium chloride (K-DUR) 10 MEQ tablet Take 1 tablet (10 mEq total) by mouth daily. 15 tablet 0  . predniSONE (DELTASONE) 5 MG tablet Label  & dispense according to the schedule below. Take 8 Pills PO for 3 days, 6 Pills PO for 3 days, 4 Pills PO for  3 days, 2 Pills PO for 3 days, 1 Pills PO for 3 days, 1/2 Pill  PO for 3 days then STOP. Total 65 pills. 65 tablet 0  . rosuvastatin (CRESTOR) 20 MG tablet Take 1 tablet (20 mg total) by mouth daily. 90 tablet 3  . SPIRIVA HANDIHALER 18 MCG inhalation capsule PLACE 1 CAPSULE INTO INHALER AND INHALE DAILY. 30 capsule 11   No current facility-administered medications on file prior to visit.    Review of Systems  Constitutional: Negative for other unusual diaphoresis or sweats HENT: Negative for ear discharge or swelling Eyes: Negative for other worsening visual disturbances Respiratory: Negative for stridor or other swelling  Gastrointestinal: Negative for worsening distension or other blood Genitourinary: Negative for retention or other urinary change Musculoskeletal: Negative for other MSK pain or swelling Skin: Negative for color change or other new lesions Neurological: Negative for worsening tremors and other numbness  Psychiatric/Behavioral: Negative for worsening agitation or other fatigue All other system neg per pt    Objective:   Physical Exam BP 116/60   Pulse (!) 58   SpO2 99%  VS noted, not ill appearing, some fatigued Constitutional: Pt appears in NAD HENT: Head: NCAT.  Right Ear: External ear normal.  Left Ear: External ear normal.  Eyes: . Pupils are equal, round, and reactive to light. Conjunctivae and EOM are normal Nose: without d/c or deformity Neck: Neck supple. Gross normal ROM Cardiovascular: Normal rate and regular rhythm.   Pulmonary/Chest: Effort normal and breath sounds decreased without rales or wheezing.  Abd:  Soft, NT, ND, + BS, no organomegaly Neurological: Pt is alert. At baseline orientation, motor grossly intact Skin: Skin is warm. No rashes, other new lesions, no LE edema Psychiatric: Pt behavior is normal without agitation  No other exam findings  Lab Results  Component Value Date   WBC 13.2 (H) 02/09/2017   HGB 16.8 (H) 02/09/2017   HCT  52.1 (H) 02/09/2017   PLT 173 02/09/2017   GLUCOSE 189 (H) 02/11/2017   CHOL 239 (H) 10/19/2015   TRIG 270.0 (H) 10/19/2015   HDL 43.20 10/19/2015   LDLDIRECT 153.0 10/19/2015   LDLCALC 68 02/02/2014   ALT 36 (H) 10/19/2015   AST 23 10/19/2015   NA 141 02/11/2017   K 4.1 02/11/2017   CL 96 (L) 02/11/2017   CREATININE 0.55 02/11/2017   BUN 16 02/11/2017   CO2 36 (H) 02/11/2017   TSH 1.54 10/19/2015   HGBA1C 6.1 (H) 12/10/2016        Assessment & Plan:

## 2017-02-19 NOTE — Assessment & Plan Note (Signed)
Improving, to finish antibx, prednisone, inhaler as rx

## 2017-02-19 NOTE — Assessment & Plan Note (Signed)
stable overall by history and exam, recent data reviewed with pt, and pt to continue medical treatment as before,  to f/u any worsening symptoms or concerns  

## 2017-02-19 NOTE — Progress Notes (Signed)
Pre visit review using our clinic review tool, if applicable. No additional management support is needed unless otherwise documented below in the visit note. 

## 2017-02-19 NOTE — Assessment & Plan Note (Signed)
stable overall by history and exam, recent data reviewed with pt, and pt to continue medical treatment as before,  to f/u any worsening symptoms or concerns BP Readings from Last 3 Encounters:  02/19/17 116/60  02/17/17 130/60  02/11/17 (!) 114/53

## 2017-02-24 ENCOUNTER — Telehealth: Payer: Self-pay | Admitting: Pulmonary Disease

## 2017-02-24 NOTE — Telephone Encounter (Signed)
Left a message to patient to call back and reschedule her appt with Dr. Elsworth Soho on 5/9 at 300. Dr. Elsworth Soho will not be in the office at that time.

## 2017-02-26 ENCOUNTER — Institutional Professional Consult (permissible substitution): Payer: Commercial Managed Care - HMO | Admitting: Pulmonary Disease

## 2017-02-26 ENCOUNTER — Other Ambulatory Visit: Payer: Self-pay | Admitting: *Deleted

## 2017-02-26 MED ORDER — FUROSEMIDE 40 MG PO TABS
40.0000 mg | ORAL_TABLET | Freq: Two times a day (BID) | ORAL | 3 refills | Status: DC
Start: 2017-02-26 — End: 2017-07-03

## 2017-02-26 MED ORDER — POTASSIUM CHLORIDE ER 10 MEQ PO TBCR: 10.0000 meq | EXTENDED_RELEASE_TABLET | Freq: Every day | ORAL | 3 refills | Status: DC

## 2017-03-07 ENCOUNTER — Encounter: Payer: Self-pay | Admitting: Pulmonary Disease

## 2017-03-07 ENCOUNTER — Ambulatory Visit (INDEPENDENT_AMBULATORY_CARE_PROVIDER_SITE_OTHER): Payer: Commercial Managed Care - HMO | Admitting: Pulmonary Disease

## 2017-03-07 VITALS — BP 108/60 | HR 71 | Ht 61.0 in | Wt 171.0 lb

## 2017-03-07 DIAGNOSIS — G4734 Idiopathic sleep related nonobstructive alveolar hypoventilation: Secondary | ICD-10-CM

## 2017-03-07 DIAGNOSIS — Z720 Tobacco use: Secondary | ICD-10-CM | POA: Diagnosis not present

## 2017-03-07 DIAGNOSIS — Z87891 Personal history of nicotine dependence: Secondary | ICD-10-CM | POA: Insufficient documentation

## 2017-03-07 DIAGNOSIS — J449 Chronic obstructive pulmonary disease, unspecified: Secondary | ICD-10-CM | POA: Diagnosis not present

## 2017-03-07 MED ORDER — NICOTINE 10 MG IN INHA: 1.0000 | RESPIRATORY_TRACT | 4 refills | Status: DC | PRN

## 2017-03-07 MED ORDER — TIOTROPIUM BROMIDE-OLODATEROL 2.5-2.5 MCG/ACT IN AERS: 2.0000 | INHALATION_SPRAY | Freq: Every day | RESPIRATORY_TRACT | 0 refills | Status: DC

## 2017-03-07 NOTE — Progress Notes (Signed)
Subjective:    Patient ID: [name], female    DOB: Sep 22, 1956, 61 y.o.   MRN: [id number]  HPI  Chief Complaint  Patient presents with  . COPD    FOLLOW UP hospital stay for 3 days for coughing and O2 level got into the 80's. patient states that the hospital told her to see a pulmonary doctor     61 year old smoker presents for evaluation of COPD after recent hospital admission Last seen 06/2014 for sleep  [facility] admission for/21 2/24 for shortness of breath and grey sputum production, treated as COPD exacerbation. Treated with IV saw steroids, azithromycin and breathing treatments. She was discharged on oxygen Similar admission in February 2018  She also has a history of CAD with chronic diastolic heart failure. She underwent CABG 15 years ago  She states that she has been using oxygen only on certain occasions when she sees her saturations drop below, yesterday it was 92% and she used it for about 2 hours during sleep. She uses albuterol about once a week. She is compliant with Spiriva. She reports nocturnal headaches about twice a month for the last 6 months. ABG 01/2014 was 7.29/66/75/92%  She reports dyspnea on exertion on walking long distances on level ground or climbing stairs. She denies wheezing or cough productive of purulent sputum. She works in the [employer] ge[employer]. She smokes about a pack lasts her every 3 days started as a teenager, about 15 pack years  She tried nicotine patch and this did not work, nicotine gum causes dental problems. She inquires about Chantix Spirometry showed a ratio of 59, FEV1 of 42% FVC of 56%  Significant tests/ events reviewed  CT angiogram 11/2016 was negative for pulmonary embolism  08/2014 >> PSG neg for OSA , Desaturation as low as 85%    Past Medical History:  Diagnosis Date  . Bronchitis   . CAD (coronary artery disease) 09/03/2013  . CHF (congestive heart failure) ([place])   . COPD  (chronic obstructive pulmonary disease) ([place])   . Coronary artery disease   . Headache   . Heart disease   . Hyperlipidemia   . Hypertension   . Impaired glucose tolerance 08/20/2014  . Sinusitis       Past Surgical History:  Procedure Laterality Date  . CORONARY ARTERY BYPASS GRAFT  2003   Triple bypass  . ESOPHAGOGASTRODUODENOSCOPY Left 06/13/2014   Procedure: ESOPHAGOGASTRODUODENOSCOPY (EGD);  Surgeon: Juanita Craver, MD;  Location: [facility] ENDOSCOPY;  Service: Endoscopy;  Laterality: Left;  . RIGID ESOPHAGOSCOPY N/A 06/12/2014   Procedure: RIGID ESOPHAGOSCOPY WITH FOREIGN BODY REMOVAL;  Surgeon: Ascencion Dike, MD;  Location: [facility];  Service: ENT;  Laterality: N/A;      No Known Allergies    Social History   Social History  . Marital status: Single    Spouse name: N/A  . Number of children: N/A  . Years of education: N/A   Occupational History  . Not on file.   Social History Main Topics  . Smoking status: Current Every Day Smoker    Packs/day: 0.50    Years: 40.00    Types: Cigarettes  . Smokeless tobacco: Never Used  . Alcohol use 3.0 - 3.6 oz/week    5 - 6 Glasses of wine per week  . Drug use: Yes    Types: Marijuana     Comment: as a teenager  . Sexual activity: Not Currently    Birth control/ protection:  None   Other Topics Concern  . Not on file   Social History Narrative  . No narrative on file      Family History  Problem Relation Age of Onset  . Lung cancer Paternal Uncle   . Heart disease Mother   . Brain cancer Mother   . Cancer Maternal Grandmother        colon  . Stroke Other   . Cancer Unknown        x 4 aunts  . Lung cancer Maternal Aunt   . Suicidality Brother     Review of Systems    Constitutional: negative for anorexia, fevers and sweats  Eyes: negative for irritation, redness and visual disturbance  Ears, nose, mouth, throat, and face: negative for earaches, epistaxis, nasal congestion and sore throat  Respiratory: negative  for cough, sputum and wheezing  Cardiovascular: negative for chest pain, dyspnea, lower extremity edema, orthopnea, palpitations and syncope  Gastrointestinal: negative for abdominal pain, constipation, diarrhea, melena, nausea and vomiting  Genitourinary:negative for dysuria, frequency and hematuria  Hematologic/lymphatic: negative for bleeding, easy bruising and lymphadenopathy  Musculoskeletal:negative for arthralgias, muscle weakness and stiff joints  Neurological: negative for coordination problems, gait problems, headaches and weakness  Endocrine: negative for diabetic symptoms including polydipsia, polyuria and weight loss     Objective:   Physical Exam  Gen. Pleasant, obese, in no distress, normal affect ENT - no lesions, no post nasal drip, class 2-3 airway Neck: No JVD, no thyromegaly, no carotid bruits Lungs: no use of accessory muscles, no dullness to percussion, decreased without rales or rhonchi  Cardiovascular: Rhythm regular, heart sounds  normal, no murmurs or gallops, no peripheral edema Abdomen: soft and non-tender, no hepatosplenomegaly, BS normal. Musculoskeletal: No deformities, no cyanosis or clubbing Neuro:  alert, non focal, no tremors       Assessment & Plan:

## 2017-03-07 NOTE — Assessment & Plan Note (Signed)
We emphasized smoking cessation. Since she had to exacerbation this year, we will change her to LABA/ LAMA combination. If necessary we will add a nasal steroid to his regimen  Trial of STIOLTO instead of Spiriva- call us for prescription of this works  Referral to pulmonary rehabilitation

## 2017-03-07 NOTE — Addendum Note (Signed)
Addended by: Jannette Spanner on: 03/07/2017 03:07 PM   Modules accepted: Orders

## 2017-03-07 NOTE — Assessment & Plan Note (Signed)
Consult about smoking cessation. Prescription given for Nicotrol Inhaler  If unable to quit with this, we'll use Chantix

## 2017-03-07 NOTE — Progress Notes (Signed)
   Subjective:    Patient ID: [name], female    DOB: 29-Apr-1956, 61 y.o.   MRN: [id number]  HPI    Review of Systems  Constitutional: Negative for fever and unexpected weight change.  HENT: Positive for sneezing. Negative for congestion, dental problem, ear pain, nosebleeds, postnasal drip, rhinorrhea, sinus pressure, sore throat and trouble swallowing.   Eyes: Negative for redness and itching.  Respiratory: Positive for cough and shortness of breath. Negative for chest tightness and wheezing.   Cardiovascular: Negative for palpitations and leg swelling.  Gastrointestinal: Negative for nausea and vomiting.  Genitourinary: Negative for dysuria.  Musculoskeletal: Negative for joint swelling.  Skin: Negative for rash.  Neurological: Positive for headaches.  Hematological: Does not bruise/bleed easily.  Psychiatric/Behavioral: Negative for dysphoric mood. The patient is not nervous/anxious.        Objective:   Physical Exam        Assessment & Plan:

## 2017-03-07 NOTE — Addendum Note (Signed)
Addended by: Jannette Spanner on: 03/07/2017 02:49 PM   Modules accepted: Orders

## 2017-03-07 NOTE — Patient Instructions (Addendum)
You have severe COPD -lung capacity is at 40% Stop using oxygen Trial of Nicotrol Inhaler #5   Trial of STIOLTO instead of Spiriva- call us for prescription of this works

## 2017-03-07 NOTE — Assessment & Plan Note (Signed)
Can stop using oxygen and daytime and continue using at night. We'll reassess on next visit

## 2017-03-09 ENCOUNTER — Other Ambulatory Visit: Payer: Self-pay | Admitting: Cardiology

## 2017-03-14 ENCOUNTER — Encounter: Payer: Self-pay | Admitting: Pulmonary Disease

## 2017-03-14 ENCOUNTER — Telehealth: Payer: Self-pay | Admitting: Pulmonary Disease

## 2017-03-14 NOTE — Telephone Encounter (Signed)
Called [employer] at [phone] to initiate the PA since the PA could not be done in [email or web address].    Information has been given and it can take 24 hours to 2 weeks for approval or denial.  This has been sent to clinical review.  Will forward to [place] to follow up on.  Case # [id number]

## 2017-03-14 NOTE — Telephone Encounter (Signed)
Will hold for answer on PA. Patient is aware.

## 2017-03-14 NOTE — Telephone Encounter (Signed)
From: Lorenda Peck  Sent: 5/25/20183:48 PM EDT  To: Rigoberto Noel., MD Subject: Non-Urgent Medical Question  Dr. Elsworth Soho,   I have been unable to pick up the prescription Nicotrol.[facility] requires prior authorization before filling this prescription.Please call [facility] @800 [phone]. Also,I am also trying to get a smaller oxygen tank, I need one that weighs as less as possible, I am working full time and the weight of the one that I carry with me is difficult.   Informed patient that the PA for the Nicotrol is being processed. Just waiting on a response from the insurance company. Patient is asking for a smaller POC. Is it ok to place an order for her to receive one? Thanks!

## 2017-03-18 ENCOUNTER — Emergency Department (HOSPITAL_COMMUNITY)
Admission: EM | Admit: 2017-03-18 | Discharge: 2017-03-19 | Disposition: A | Discharge status: Home / Self Care | Payer: Commercial Managed Care - HMO | Attending: Emergency Medicine | Admitting: Emergency Medicine

## 2017-03-18 ENCOUNTER — Encounter (HOSPITAL_COMMUNITY): Payer: Self-pay | Admitting: Emergency Medicine

## 2017-03-18 ENCOUNTER — Emergency Department (HOSPITAL_COMMUNITY): Payer: Commercial Managed Care - HMO

## 2017-03-18 ENCOUNTER — Encounter: Payer: Self-pay | Admitting: Internal Medicine

## 2017-03-18 DIAGNOSIS — J181 Lobar pneumonia, unspecified organism: Secondary | ICD-10-CM | POA: Diagnosis not present

## 2017-03-18 DIAGNOSIS — F17210 Nicotine dependence, cigarettes, uncomplicated: Secondary | ICD-10-CM | POA: Diagnosis not present

## 2017-03-18 DIAGNOSIS — Z79899 Other long term (current) drug therapy: Secondary | ICD-10-CM | POA: Insufficient documentation

## 2017-03-18 DIAGNOSIS — J449 Chronic obstructive pulmonary disease, unspecified: Secondary | ICD-10-CM

## 2017-03-18 DIAGNOSIS — I110 Hypertensive heart disease with heart failure: Secondary | ICD-10-CM | POA: Diagnosis not present

## 2017-03-18 DIAGNOSIS — I509 Heart failure, unspecified: Secondary | ICD-10-CM | POA: Diagnosis not present

## 2017-03-18 DIAGNOSIS — Z7982 Long term (current) use of aspirin: Secondary | ICD-10-CM | POA: Diagnosis not present

## 2017-03-18 DIAGNOSIS — I10 Essential (primary) hypertension: Secondary | ICD-10-CM | POA: Insufficient documentation

## 2017-03-18 DIAGNOSIS — R0602 Shortness of breath: Secondary | ICD-10-CM | POA: Diagnosis present

## 2017-03-18 DIAGNOSIS — Z951 Presence of aortocoronary bypass graft: Secondary | ICD-10-CM | POA: Diagnosis not present

## 2017-03-18 DIAGNOSIS — J189 Pneumonia, unspecified organism: Secondary | ICD-10-CM

## 2017-03-18 LAB — COMPREHENSIVE METABOLIC PANEL
ALK PHOS: 117 U/L (ref 38–126)
ALT: 45 U/L (ref 14–54)
ANION GAP: 10 (ref 5–15)
AST: 32 U/L (ref 15–41)
Albumin: 3.6 g/dL (ref 3.5–5.0)
BILIRUBIN TOTAL: 0.9 mg/dL (ref 0.3–1.2)
BUN: <5 mg/dL — ABNORMAL LOW (ref 6–20)
CALCIUM: 9.2 mg/dL (ref 8.9–10.3)
CO2: 29 mmol/L (ref 22–32)
Chloride: 100 mmol/L — ABNORMAL LOW (ref 101–111)
Creatinine, Ser: 0.51 mg/dL (ref 0.44–1.00)
GFR calc Af Amer: >60 mL/min (ref >60)
Glucose, Bld: 120 mg/dL — ABNORMAL HIGH (ref 65–99)
POTASSIUM: 3.9 mmol/L (ref 3.5–5.1)
Sodium: 139 mmol/L (ref 135–145)
TOTAL PROTEIN: 7.1 g/dL (ref 6.5–8.1)

## 2017-03-18 LAB — CBC WITH DIFFERENTIAL/PLATELET
Basophils Absolute: 0 10*3/uL (ref 0.0–0.1)
Basophils Relative: 0 %
Eosinophils Absolute: 0.1 10*3/uL (ref 0.0–0.7)
Eosinophils Relative: 1 %
HEMATOCRIT: 44.2 % (ref 36.0–46.0)
HEMOGLOBIN: 14.3 g/dL (ref 12.0–15.0)
LYMPHS ABS: 2.7 10*3/uL (ref 0.7–4.0)
LYMPHS PCT: 22 %
MCH: 28.6 pg (ref 26.0–34.0)
MCHC: 32.4 g/dL (ref 30.0–36.0)
MCV: 88.4 fL (ref 78.0–100.0)
MONO ABS: 0.8 10*3/uL (ref 0.1–1.0)
MONOS PCT: 6 %
NEUTROS ABS: 8.8 10*3/uL — AB (ref 1.7–7.7)
NEUTROS PCT: 71 %
Platelets: 215 10*3/uL (ref 150–400)
RBC: 5 MIL/uL (ref 3.87–5.11)
RDW: 14.8 % (ref 11.5–15.5)
WBC: 12.3 10*3/uL — ABNORMAL HIGH (ref 4.0–10.5)

## 2017-03-18 MED ORDER — ACETAMINOPHEN 325 MG PO TABS
ORAL_TABLET | ORAL | Status: AC
  Filled 2017-03-18: qty 2

## 2017-03-18 MED ORDER — IPRATROPIUM-ALBUTEROL 0.5-2.5 (3) MG/3ML IN SOLN: 3.0000 mL | Freq: Four times a day (QID) | RESPIRATORY_TRACT | 5 refills | Status: DC | PRN

## 2017-03-18 MED ORDER — ACETAMINOPHEN 325 MG PO TABS
650.0000 mg | ORAL_TABLET | Freq: Once | ORAL | Status: AC
  Administered 2017-03-18: 650 mg via ORAL

## 2017-03-18 NOTE — Telephone Encounter (Signed)
Based on her last visit, we advised her to stop using oxygen in the daytime and continue using the concentrator at night. So she should not need portable oxygen anymore We will reassess need on future visit

## 2017-03-18 NOTE — ED Provider Notes (Signed)
[facility] DEPT Provider Note   CSN: [id number] Arrival date & time: 03/18/17  1856     History   Chief Complaint Chief Complaint  Patient presents with  . Shortness of Breath  . Fever    HPI [name] is a 61 y.o. female.  The history is provided by the patient.  Shortness of Breath  Associated symptoms include a fever.  Fever    She has a history of CHF and COPD, but stopped smoking about one month ago. She comes in with a one-week history of cough, fevers as high as 101, chills, and sweats. Cough is productive of thick green sputum. She has noted some increase in her baseline dyspnea. She has had increased need for oxygen at home which he usually uses on a when necessary basis. She denies chest pain, heaviness, tightness, pressure. She denies arthralgias or myalgias. She went to urgent care today where chest x-ray showed possible pneumonia and she was referred here.  Past Medical History:  Diagnosis Date  . Bronchitis   . CAD (coronary artery disease) 09/03/2013  . CHF (congestive heart failure) ([place])   . COPD (chronic obstructive pulmonary disease) ([place])   . Coronary artery disease   . Headache   . Heart disease   . Hyperlipidemia   . Hypertension   . Impaired glucose tolerance 08/20/2014  . Sinusitis     Patient Active Problem List   Diagnosis Date Noted  . Tobacco abuse 03/07/2017  . Sinusitis, acute 04/08/2016  . Left lumbar radiculopathy 10/19/2015  . Cough 08/29/2015  . COPD exacerbation ([place]) 08/29/2015  . Pedal edema 02/22/2015  . Impaired glucose tolerance 08/20/2014  . Pruritus 07/08/2014  . Nocturnal hypoxemia 07/05/2014  . Esophagus, foreign body 06/12/2014  . Polycythemia, secondary 02/16/2014  . 5 mm Lung nodule, solitary 02/02/2014  . GERD (gastroesophageal reflux disease) 02/01/2014  . Hypersomnolence 02/01/2014  . COPD with acute exacerbation ([place]) 02/01/2014  . Allergic rhinitis, cause unspecified 09/03/2013  . CAD (coronary  artery disease) 09/03/2013  . Preventative health care 09/03/2013  . Essential hypertension 07/07/2013  . COPD (chronic obstructive pulmonary disease) with chronic bronchitis ([place]) 07/07/2013  . Status post coronary artery bypass grafting 07/07/2013  . Headache 07/01/2013  . Hypertensive emergency 07/01/2013  . Polycythemia 07/01/2013    Past Surgical History:  Procedure Laterality Date  . CORONARY ARTERY BYPASS GRAFT  2003   Triple bypass  . ESOPHAGOGASTRODUODENOSCOPY Left 06/13/2014   Procedure: ESOPHAGOGASTRODUODENOSCOPY (EGD);  Surgeon: Juanita Craver, MD;  Location: [facility] ENDOSCOPY;  Service: Endoscopy;  Laterality: Left;  . RIGID ESOPHAGOSCOPY N/A 06/12/2014   Procedure: RIGID ESOPHAGOSCOPY WITH FOREIGN BODY REMOVAL;  Surgeon: Ascencion Dike, MD;  Location: [facility] OR;  Service: ENT;  Laterality: N/A;    OB History    No data available       Home Medications    Prior to Admission medications   Medication Sig Start Date End Date Taking? Authorizing Provider  acetaminophen (TYLENOL) 500 MG tablet Take 1,000 mg by mouth at bedtime as needed for headache.    [provider]  albuterol (PROVENTIL HFA) 108 (90 Base) MCG/ACT inhaler INHALE 1-2 PUFFS INTO THE LUNGS EVERY 6 (SIX) HOURS AS NEEDED FOR WHEEZING OR SHORTNESS OF BREATH. 02/04/17   Biagio Borg, MD  amLODipine (NORVASC) 5 MG tablet TAKE 1 TABLET BY MOUTH EVERY DAY 03/10/17   Jerline Pain, MD  aspirin EC 81 MG tablet Take 1 tablet (81 mg total) by mouth daily. 07/02/13  Olam Idler, MD  carvedilol (COREG) 12.5 MG tablet TAKE 1 TABLET (12.5 MG TOTAL) BY MOUTH 2 (TWO) TIMES DAILY. 03/10/17   Jerline Pain, MD  cyclobenzaprine (FLEXERIL) 5 MG tablet Take 1 tablet (5 mg total) by mouth 3 (three) times daily as needed for muscle spasms. 10/19/15   Biagio Borg, MD  fluticasone (FLONASE) 50 MCG/ACT nasal spray PLACE 2 SPRAYS INTO BOTH NOSTRILS DAILY. 09/22/14   Biagio Borg, MD  furosemide (LASIX) 40 MG tablet Take 1 tablet (40 mg  total) by mouth 2 (two) times daily. 02/26/17   Biagio Borg, MD  HYDROcodone-acetaminophen (NORCO) 10-325 MG tablet Take 1 tablet by mouth every 6 (six) hours as needed. 06/05/16   Biagio Borg, MD  ipratropium-albuterol (DUONEB) 0.5-2.5 (3) MG/3ML SOLN Take 3 mLs by nebulization every 6 (six) hours as needed. 03/18/17   Biagio Borg, MD  isosorbide mononitrate (IMDUR) 60 MG 24 hr tablet TAKE 1 & 1/2 TABLET BY MOUTH DAILY 03/10/17   Jerline Pain, MD  nicotine (NICOTROL) 10 MG inhaler Inhale 1 Cartridge (1 continuous puffing total) into the lungs as needed for smoking cessation. 03/07/17   Rigoberto Noel, MD  potassium chloride (K-DUR) 10 MEQ tablet Take 1 tablet (10 mEq total) by mouth daily. 02/26/17   Biagio Borg, MD  rosuvastatin (CRESTOR) 20 MG tablet Take 1 tablet (20 mg total) by mouth daily. 11/12/16   Biagio Borg, MD  SPIRIVA HANDIHALER 18 MCG inhalation capsule PLACE 1 CAPSULE INTO INHALER AND INHALE DAILY. 02/07/17   Biagio Borg, MD  Tiotropium Bromide-Olodaterol (STIOLTO RESPIMAT) 2.5-2.5 MCG/ACT AERS Inhale 2 puffs into the lungs daily. 03/07/17   Rigoberto Noel, MD    Family History Family History  Problem Relation Age of Onset  . Lung cancer Paternal Uncle   . Heart disease Mother   . Brain cancer Mother   . Cancer Maternal Grandmother        colon  . Stroke Other   . Cancer Unknown        x 4 aunts  . Lung cancer Maternal Aunt   . Suicidality Brother     Social History Social History  Substance Use Topics  . Smoking status: Current Every Day Smoker    Packs/day: 0.50    Years: 40.00    Types: Cigarettes  . Smokeless tobacco: Never Used  . Alcohol use 3.0 - 3.6 oz/week    5 - 6 Glasses of wine per week     Allergies   Patient has no known allergies.   Review of Systems Review of Systems  Constitutional: Positive for fever.  Respiratory: Positive for shortness of breath.   All other systems reviewed and are negative.    Physical Exam Updated Vital  Signs BP (!) 103/51   Pulse 80   Temp 99 F (37.2 C) (Oral)   Resp (!) 23   Ht 5\' 1"  (1.549 m)   Wt 78.5 kg (173 lb)   SpO2 100%   BMI 32.69 kg/m   Physical Exam  Nursing note and vitals reviewed.  61 year old female, resting comfortably and in no acute distress. Vital signs are significant for tachypnea. Oxygen saturation is 100%, which is normal. Head is normocephalic and atraumatic. PERRLA, EOMI. Oropharynx is clear. Neck is nontender and supple without adenopathy or JVD. Back is nontender and there is no CVA tenderness. Lungs are clear without rales, wheezes, or rhonchi. Chest is nontender. Heart has  regular rate and rhythm without murmur. Abdomen is soft, flat, nontender without masses or hepatosplenomegaly and peristalsis is normoactive. Extremities have no cyanosis or edema, full range of motion is present. Skin is warm and dry without rash. Neurologic: Mental status is normal, cranial nerves are intact, there are no motor or sensory deficits.  ED Treatments / Results  Labs (all labs ordered are listed, but only abnormal results are displayed) Labs Reviewed  CBC WITH DIFFERENTIAL/PLATELET - Abnormal; Notable for the following:       Result Value   WBC 12.3 (*)    Neutro Abs 8.8 (*)    All other components within normal limits  COMPREHENSIVE METABOLIC PANEL - Abnormal; Notable for the following:    Chloride 100 (*)    Glucose, Bld 120 (*)    BUN <5 (*)    All other components within normal limits   Radiology Dg Chest 2 View  Result Date: 03/18/2017 CLINICAL DATA:  61 year old female with shortness of breath. EXAM: CHEST  2 VIEW COMPARISON:  Chest radiograph dated 02/10/2017 FINDINGS: There is focal area of increased density in the right hilum with linear and streaky bilateral infrahilar densities. Although findings may be related to chronic changes, the right hilar density appears more prominent compared to the prior radiograph and superimposed pneumonia is not  excluded. Clinical correlation and follow-up recommended. There is no pleural effusion or pneumothorax. The cardiac silhouette is within normal limits. Median sternotomy wires and CABG vascular clips noted. No acute osseous pathology identified. IMPRESSION: Slight prominence of the right hilar density compared to the prior radiograph. Superimposed pneumonia on chronic interstitial changes is not excluded. Clinical correlation and follow-up recommended. Electronically Signed   By: Anner Crete M.D.   On: 03/18/2017 20:22    Procedures Procedures (including critical care time)  Medications Ordered in ED Medications  amoxicillin (AMOXIL) capsule 1,000 mg (not administered)  acetaminophen (TYLENOL) tablet 650 mg (650 mg Oral Given 03/18/17 1941)     Initial Impression / Assessment and Plan / ED Course  I have reviewed the triage vital signs and the nursing notes.  Pertinent labs & imaging results that were available during my care of the patient were reviewed by me and considered in my medical decision making (see chart for details).  Respiratory tract infection. Chest x-ray does appear to show a right hilar infiltrate and she will need to be started on antibiotics. Old records are reviewed showing to recent hospitalizations for COPD exacerbations. Currently, I detect no wheezing or prolonged exhalation phase and see no indication of COPD exacerbation. She will be ambulated in the ED While carrying the oxygen level that she uses at home (2 L/m). If she is able to tolerate this, will send home on oral antibiotics.  She was able to ambulate with oxygen saturation not dropping below 95%. There is no dyspnea with ambulation. She is safe to go home. She is discharged with prescription for amoxicillin. Strict return precautions are discussed. She is given a work release for 2 days.  Final Clinical Impressions(s) / ED Diagnoses   Final diagnoses:  Community acquired pneumonia of right middle lobe of  lung ([place])    New Prescriptions New Prescriptions   AMOXICILLIN (AMOXIL) 500 MG CAPSULE    Take 2 capsules (1,000 mg total) by mouth 2 (two) times daily.     Delora Fuel, MD 34/28/76 [phone]

## 2017-03-18 NOTE — ED Triage Notes (Signed)
Pt sent to ED from Urgent Care with c/o shortness of breath.  Pt st's she has COPD but her breathing is worse.than normal.  Pt also c/o elevated temp x's 2 days.  Pt is on home 02 as needed at home

## 2017-03-18 NOTE — Telephone Encounter (Signed)
rx for nebulizer machine - Done hardcopy to [facility]  I have rx the med [place] as well

## 2017-03-18 NOTE — Addendum Note (Signed)
Addended by: Biagio Borg on: 03/18/2017 11:55 AM   Modules accepted: Orders

## 2017-03-19 ENCOUNTER — Other Ambulatory Visit: Payer: Self-pay | Admitting: Cardiology

## 2017-03-19 MED ORDER — AMOXICILLIN 500 MG PO CAPS: 1000.0000 mg | ORAL_CAPSULE | Freq: Two times a day (BID) | ORAL | 0 refills | Status: DC

## 2017-03-19 MED ORDER — AMOXICILLIN 500 MG PO CAPS
1000.0000 mg | ORAL_CAPSULE | Freq: Once | ORAL | Status: AC
  Administered 2017-03-19: 1000 mg via ORAL
  Filled 2017-03-19: qty 2

## 2017-03-19 NOTE — ED Notes (Addendum)
Pt ambulated self efficiently with no difficulty. O2 @ 97% at bedside while resting. O2 during ambulation dropped to 95%.

## 2017-03-19 NOTE — Discharge Instructions (Signed)
Return if symptoms are getting worse. °

## 2017-03-20 NOTE — Telephone Encounter (Signed)
Medication Detail    Disp Refills Start End   isosorbide mononitrate (IMDUR) 60 MG 24 hr tablet 45 tablet 10 03/10/2017    Sig: TAKE 1 & 1/2 TABLET BY MOUTH DAILY   E-Prescribing Status: Receipt confirmed by pharmacy (03/10/2017 11:23 AM EDT)   Pharmacy   [id number] - [place], [place] - [place] [address]

## 2017-03-21 ENCOUNTER — Telehealth (HOSPITAL_COMMUNITY): Payer: Self-pay

## 2017-03-21 ENCOUNTER — Inpatient Hospital Stay (HOSPITAL_COMMUNITY): Admission: RE | Admit: 2017-03-21 | Payer: Commercial Managed Care - HMO | Source: Ambulatory Visit

## 2017-03-21 NOTE — Telephone Encounter (Signed)
Ok to appeal

## 2017-03-21 NOTE — Telephone Encounter (Signed)
PA for the Nicotrol inhaler was denied. [facility] stated that this decision can be appealed if the patient has a documented failure to Nicoderm patches or Nicorette gum. Reviewed patient's chart and did find documentation of the failures of the patches and gum.   Dr. Elsworth Soho, is ok for me to write an appeal letter for her? Thanks!

## 2017-03-25 NOTE — Telephone Encounter (Signed)
Letter has been printed out. Waiting on RA's signature so that I can place it in the mail.

## 2017-03-25 NOTE — Telephone Encounter (Signed)
Letter has been signed. Will fax to [facility].

## 2017-03-28 ENCOUNTER — Other Ambulatory Visit: Payer: Self-pay | Admitting: Pulmonary Disease

## 2017-03-28 MED ORDER — TIOTROPIUM BROMIDE-OLODATEROL 2.5-2.5 MCG/ACT IN AERS
2.0000 | INHALATION_SPRAY | Freq: Every day | RESPIRATORY_TRACT | 5 refills | Status: DC
Start: 2017-03-28 — End: 2017-04-30

## 2017-03-31 ENCOUNTER — Ambulatory Visit: Payer: Commercial Managed Care - HMO | Admitting: Adult Health

## 2017-04-01 ENCOUNTER — Telehealth: Payer: Self-pay | Admitting: Pulmonary Disease

## 2017-04-01 NOTE — Telephone Encounter (Signed)
PA started for [employer] via phone call to [facility]. PA# [id number]. Will await clinical decision. Could take up to 2 days.

## 2017-04-03 NOTE — Telephone Encounter (Signed)
Called [facility] to check status of PA, it is still being reviewed. Will await determination fax.

## 2017-04-11 NOTE — Telephone Encounter (Signed)
PA was denied for Stiolto due no documentation in patient's chart of her trying and failing one of the following: Anoro or Bevespi.   RA, do you want her to switch to one of the mentioned inhalers above?

## 2017-04-11 NOTE — Telephone Encounter (Signed)
ANORO once daily

## 2017-04-11 NOTE — Telephone Encounter (Signed)
lmtcb xq1 for pt

## 2017-04-22 ENCOUNTER — Ambulatory Visit: Payer: Commercial Managed Care - HMO | Admitting: Adult Health

## 2017-04-30 ENCOUNTER — Other Ambulatory Visit: Payer: 59

## 2017-04-30 ENCOUNTER — Ambulatory Visit (INDEPENDENT_AMBULATORY_CARE_PROVIDER_SITE_OTHER): Payer: 59 | Admitting: Pulmonary Disease

## 2017-04-30 ENCOUNTER — Encounter: Payer: Self-pay | Admitting: Pulmonary Disease

## 2017-04-30 VITALS — BP 120/70 | HR 71 | Ht 61.0 in | Wt 171.8 lb

## 2017-04-30 DIAGNOSIS — G4734 Idiopathic sleep related nonobstructive alveolar hypoventilation: Secondary | ICD-10-CM | POA: Diagnosis not present

## 2017-04-30 DIAGNOSIS — J449 Chronic obstructive pulmonary disease, unspecified: Secondary | ICD-10-CM

## 2017-04-30 DIAGNOSIS — F17200 Nicotine dependence, unspecified, uncomplicated: Secondary | ICD-10-CM

## 2017-04-30 MED ORDER — UMECLIDINIUM-VILANTEROL 62.5-25 MCG/INH IN AEPB: 1.0000 | INHALATION_SPRAY | Freq: Every day | RESPIRATORY_TRACT | 0 refills | Status: DC

## 2017-04-30 MED ORDER — UMECLIDINIUM-VILANTEROL 62.5-25 MCG/INH IN AEPB: 1.0000 | INHALATION_SPRAY | Freq: Every day | RESPIRATORY_TRACT | 3 refills | Status: DC

## 2017-04-30 NOTE — Addendum Note (Signed)
Addended by: Tyson Dense on: 04/30/2017 02:55 PM   Modules accepted: Orders

## 2017-04-30 NOTE — Progress Notes (Signed)
Subjective:    Patient ID: [name], female    DOB: [birth year], 61 y.o.   MRN: [id number]  C.C.:  Follow-up for Severe COPD, Nocturnal Hypoxia, & Tobacco Use Disorder.   HPI Severe COPD: Previously on Spiriva switched to [employer]. However last phone note indicates patient is supposed to be taking Anoro. She reports her breathing is doing "fine". She still has dyspnea on exertion, specifically walking. She reports she had increased dyspnea last weekend that she attributed to the increased heat & humidity. She denies any wheezing. Did wake up last night coughing. She reports her cough is productive of a clear mucus intermittently. She reports she is still only using Spiriva. She is using her rescue inhaler rarely. She has nebulizer medication but no nebulizer at home.   Nocturnal hypoxia: Noted on previous polysomnogram from 2015 by documentation. No evidence of oxygen requirement from previous ambulations performed recently. She reports she is using oxygen at night as prescribed but does awaken without it at times.   Tobacco use disorder:  She quit smoking last month and hasn't relapsed. She does have a history of relapses. She still has the craving for a cigarette.   Review of Systems She denies any chest pain or pressure. No fever, chills, or sweats. No abdominal pain, nausea, or emesis.   No Known Allergies  Current Outpatient Prescriptions on File Prior to Visit  Medication Sig Dispense Refill  . acetaminophen (TYLENOL) 500 MG tablet Take 1,000 mg by mouth at bedtime as needed for headache.    . albuterol (PROVENTIL HFA) 108 (90 Base) MCG/ACT inhaler INHALE 1-2 PUFFS INTO THE LUNGS EVERY 6 (SIX) HOURS AS NEEDED FOR WHEEZING OR SHORTNESS OF BREATH. 6.7 Inhaler 5  . amLODipine (NORVASC) 5 MG tablet TAKE 1 TABLET BY MOUTH EVERY DAY 30 tablet 10  . aspirin EC 81 MG tablet Take 1 tablet (81 mg total) by mouth daily. 30 tablet 0  . carvedilol (COREG) 12.5 MG tablet TAKE 1 TABLET (12.5 MG  TOTAL) BY MOUTH 2 (TWO) TIMES DAILY. 60 tablet 10  . cholecalciferol (VITAMIN D) 1000 units tablet Take 1,000 Units by mouth daily.    . cyclobenzaprine (FLEXERIL) 5 MG tablet Take 1 tablet (5 mg total) by mouth 3 (three) times daily as needed for muscle spasms. 60 tablet 1  . fluticasone (FLONASE) 50 MCG/ACT nasal spray PLACE 2 SPRAYS INTO BOTH NOSTRILS DAILY. 16 g 3  . furosemide (LASIX) 40 MG tablet Take 1 tablet (40 mg total) by mouth 2 (two) times daily. 60 tablet 3  . HYDROcodone-acetaminophen (NORCO) 10-325 MG tablet Take 1 tablet by mouth every 6 (six) hours as needed. (Patient taking differently: Take 1 tablet by mouth every 6 (six) hours as needed for moderate pain. ) 60 tablet 0  . ipratropium-albuterol (DUONEB) 0.5-2.5 (3) MG/3ML SOLN Take 3 mLs by nebulization every 6 (six) hours as needed. 360 mL 5  . isosorbide mononitrate (IMDUR) 60 MG 24 hr tablet TAKE 1 & 1/2 TABLET BY MOUTH DAILY 45 tablet 10  . omega-3 acid ethyl esters (LOVAZA) 1 g capsule Take 2 g by mouth daily.    . potassium chloride (K-DUR) 10 MEQ tablet Take 1 tablet (10 mEq total) by mouth daily. 30 tablet 3  . rosuvastatin (CRESTOR) 20 MG tablet Take 1 tablet (20 mg total) by mouth daily. 90 tablet 3  . SPIRIVA HANDIHALER 18 MCG inhalation capsule PLACE 1 CAPSULE INTO INHALER AND INHALE DAILY. 30 capsule 11  . vitamin B-12 (  CYANOCOBALAMIN) 1000 MCG tablet Take 1,000 mcg by mouth daily.    . vitamin C (ASCORBIC ACID) 500 MG tablet Take 1,000 mg by mouth daily.    Marland Kitchen amoxicillin (AMOXIL) 500 MG capsule Take 2 capsules (1,000 mg total) by mouth 2 (two) times daily. (Patient not taking: Reported on 04/30/2017) 40 capsule 0  . nicotine (NICOTROL) 10 MG inhaler Inhale 1 Cartridge (1 continuous puffing total) into the lungs as needed for smoking cessation. (Patient not taking: Reported on 04/30/2017) 36 each 4  . Tiotropium Bromide-Olodaterol (STIOLTO RESPIMAT) 2.5-2.5 MCG/ACT AERS Inhale 2 puffs into the lungs daily. (Patient not  taking: Reported on 04/30/2017) 1 Inhaler 5   No current facility-administered medications on file prior to visit.     Past Medical History:  Diagnosis Date  . Bronchitis   . CAD (coronary artery disease) 09/03/2013  . CHF (congestive heart failure) ([place])   . COPD (chronic obstructive pulmonary disease) ([place])   . Coronary artery disease   . Headache   . Heart disease   . Hyperlipidemia   . Hypertension   . Impaired glucose tolerance 08/20/2014  . Sinusitis     Past Surgical History:  Procedure Laterality Date  . CORONARY ARTERY BYPASS GRAFT  2003   Triple bypass  . ESOPHAGOGASTRODUODENOSCOPY Left 06/13/2014   Procedure: ESOPHAGOGASTRODUODENOSCOPY (EGD);  Surgeon: Juanita Craver, MD;  Location: [facility] ENDOSCOPY;  Service: Endoscopy;  Laterality: Left;  . RIGID ESOPHAGOSCOPY N/A 06/12/2014   Procedure: RIGID ESOPHAGOSCOPY WITH FOREIGN BODY REMOVAL;  Surgeon: Ascencion Dike, MD;  Location: [facility] OR;  Service: ENT;  Laterality: N/A;    Family History  Problem Relation Age of Onset  . Lung cancer Paternal Uncle   . Heart disease Mother   . Brain cancer Mother   . Cancer Maternal Grandmother        colon  . Stroke Other   . Cancer Unknown        x 4 aunts  . Lung cancer Maternal Aunt   . Suicidality Brother     Social History   Social History  . Marital status: Single    Spouse name: N/A  . Number of children: N/A  . Years of education: N/A   Social History Main Topics  . Smoking status: Former Smoker    Packs/day: 0.50    Years: 40.00    Types: Cigarettes    Quit date: 04/04/2017  . Smokeless tobacco: Never Used  . Alcohol use 3.0 - 3.6 oz/week    5 - 6 Glasses of wine per week  . Drug use: Yes    Types: Marijuana     Comment: as a teenager  . Sexual activity: Not Currently    Birth control/ protection: None   Other Topics Concern  . None   Social History Narrative  . None      Objective:   Physical Exam BP 120/70 (BP Location: Left Arm, Patient Position: Sitting,  Cuff Size: Normal)   Pulse 71   Ht 5\' 1"  (1.549 m)   Wt 171 lb 12.8 oz (77.9 kg)   SpO2 93%   BMI 32.46 kg/m  General:  Awake. Alert. No acute distress. Mild central obesity. Integument:  Warm & dry. No rash on exposed skin. Extremities:  No cyanosis or clubbing.  HEENT:  Moist mucus membranes. No oral ulcers. Minimal nasal turbinate swelling. No scleral icterus. Cardiovascular:  Regular rate. No edema. No appreciable JVD.  Pulmonary:  Good aeration bilaterally. Clear to auscultation. No accessory  muscle use on room air. Abdomen: Soft. Normal bowel sounds.  Protuberant. Musculoskeletal:  Normal bulk and tone. No joint deformity or effusion appreciated.  PFT 03/07/17: FVC 1.29 L (56%) FEV1 0.76 L (42%) FEV1/FVC 2.59  IMAGING CXR PA/LAT 03/18/17 (personally reviewed by me):  Right hilar fullness. This is new in comparison with previous x-ray imaging. No pleural effusion appreciated. No other parenchymal nodule or opacity appreciated. Heart normal in size & mediastinum normal in contour.     Assessment & Plan:  61 y.o. female with severe COPD and nocturnal hypoxia with ongoing tobacco use disorder. I congratulated the patient on having quit smoking at this point but she does have a known history of relapsed. Reviewing her chest x-ray does show some questionable right hilar fullness. This is of unclear significance. At this time her COPD seems to be reasonably controlled although, she does continue to have significant dyspnea on exertion. Reviewing her previous spirometry from May show severe airway obstruction. She would benefit from the addition of a LABA to her LAMA. Additionally, she has albuterol medication home but no nebulizer with which to use it. I instructed the patient to contact our office if she had any questions or concerns before her next appointment.  1. Severe COPD: Switching patient from Spiriva/Stiolto to Anoro. Screening for alpha-1 antitrypsin deficiency. 2. Nocturnal  hypoxia:  Continuing on oxygen as previously prescribed.  3. Tobacco use disorder: Spent over 3 minutes counseling the patient on the need for continued tobacco abstinence. Recommended trying nicotine lozenges for intermittent cravings. 4. Right hilar fullness: Defer to patient's primary pulmonologist on repeat imaging. 5. Follow-up: Return to clinic in 6 weeks or sooner if needed.  [name], M.D. [facility] Pulmonary & Critical Care Pager:  [phone] After 3pm or if no response, call [phone] 1:46 PM 04/30/17

## 2017-04-30 NOTE — Patient Instructions (Addendum)
   Continue using your albuterol inhaler as needed.  Try using a nicotine lozenge to help curb your craving for a cigarette.  We are switching you over from Spiriva to [employer]. Use this by doing 1 puff once daily. Call our office if you have any questions or problems with this inhaler.  We will see you back in 6 months or sooner if needed.   TESTS ORDERED: 1. Alpha-1 antitrypsin phenotype today

## 2017-04-30 NOTE — Progress Notes (Signed)
Patient seen in the office today and instructed on use of Anoro.  Patient expressed understanding and demonstrated technique. 

## 2017-05-07 LAB — ALPHA-1 ANTITRYPSIN PHENOTYPE: A1 ANTITRYPSIN: 143 mg/dL (ref 83–199)

## 2017-05-13 ENCOUNTER — Encounter: Payer: Self-pay | Admitting: Pulmonary Disease

## 2017-05-13 NOTE — Telephone Encounter (Signed)
JN please advise on results. Thanks! 

## 2017-05-13 NOTE — Telephone Encounter (Signed)
She is a carrier for Alpha-1 Antitrypsin Deficiency. We will discuss this at her follow-up appointment. There's no treatment necessary for her. J.

## 2017-05-30 ENCOUNTER — Ambulatory Visit: Payer: 59 | Admitting: Pulmonary Disease

## 2017-07-03 ENCOUNTER — Other Ambulatory Visit: Payer: Self-pay | Admitting: Internal Medicine

## 2017-08-12 ENCOUNTER — Ambulatory Visit: Payer: 59 | Admitting: Pulmonary Disease

## 2017-08-20 ENCOUNTER — Encounter: Payer: Self-pay | Admitting: Pulmonary Disease

## 2017-08-20 ENCOUNTER — Ambulatory Visit (INDEPENDENT_AMBULATORY_CARE_PROVIDER_SITE_OTHER): Payer: 59 | Admitting: Pulmonary Disease

## 2017-08-20 VITALS — BP 120/76 | HR 70 | Ht 61.0 in | Wt 173.0 lb

## 2017-08-20 DIAGNOSIS — Z720 Tobacco use: Secondary | ICD-10-CM

## 2017-08-20 DIAGNOSIS — J449 Chronic obstructive pulmonary disease, unspecified: Secondary | ICD-10-CM | POA: Diagnosis not present

## 2017-08-20 DIAGNOSIS — G4734 Idiopathic sleep related nonobstructive alveolar hypoventilation: Secondary | ICD-10-CM

## 2017-08-20 DIAGNOSIS — J4489 Other specified chronic obstructive pulmonary disease: Secondary | ICD-10-CM

## 2017-08-20 MED ORDER — VARENICLINE TARTRATE 0.5 MG X 11 & 1 MG X 42 PO MISC: ORAL | 0 refills | Status: DC

## 2017-08-20 NOTE — Patient Instructions (Addendum)
Rx for chantix starter pack will be sent to pharmacy  Referral to pulm rehab Stay on Anoro  Check nocturnal oxygen level to Decide about future need for oxygen during sleep

## 2017-08-20 NOTE — Assessment & Plan Note (Signed)
Check nocturnal oximetry on room air to decide about need for oxygen

## 2017-08-20 NOTE — Progress Notes (Signed)
   Subjective:    Patient ID: [name], female    DOB: 1955-11-28, 61 y.o.   MRN: [id number]  HPI  61 year old smoker for FU of COPD Last exacerbation was 01/2017 requiring hospitalization, discharged on 02 She works in the [employer].  Accompanied by her sister today-they have numerous questions She tried nicotine patch and this did not work, nicotine gum causes dental problems.  Was unable to get Nicotrol she inquires about Chantix  she continues to smoke 3-4 cigarettes/day   Anoro has worked really well for her  She has stopped using oxygen in the daytime, continues to use nocturnal oxygen   Significant tests/ events reviewed  Spirometry 02/2017 >> ratio of 59, FEV1 of 42% FVC of 56% ABG 01/2014 was 7.29/66/75/92% CT angiogram 11/2016 was negative for pulmonary embolism  08/2014 >> PSG neg for OSA , Desaturation as low as 85%  Past Medical History:  Diagnosis Date  . Bronchitis   . CAD (coronary artery disease) 09/03/2013  . CHF (congestive heart failure) ([place])   . COPD (chronic obstructive pulmonary disease) ([place])   . Coronary artery disease   . Headache   . Heart disease   . Hyperlipidemia   . Hypertension   . Impaired glucose tolerance 08/20/2014  . Sinusitis      Review of Systems neg for any significant sore throat, dysphagia, itching, sneezing, nasal congestion or excess/ purulent secretions, fever, chills, sweats, unintended wt loss, pleuritic or exertional cp, hempoptysis, orthopnea pnd or change in chronic leg swelling. Also denies presyncope, palpitations, heartburn, abdominal pain, nausea, vomiting, diarrhea or change in bowel or urinary habits, dysuria,hematuria, rash, arthralgias, visual complaints, headache, numbness weakness or ataxia.     Objective:   Physical Exam Gen. Pleasant, well-nourished, in no distress ENT - no thrush, no post nasal drip Neck: No JVD, no thyromegaly, no carotid bruits Lungs: no use of  accessory muscles, no dullness to percussion, decreasedwithout rales or rhonchi  Cardiovascular: Rhythm regular, heart sounds  normal, no murmurs or gallops, no peripheral edema Musculoskeletal: No deformities, no cyanosis or clubbing         Assessment & Plan:

## 2017-08-20 NOTE — Assessment & Plan Note (Signed)
Referral to pulm rehab Stay on Anoro  Check nocturnal oxygen level to Decide about future need for oxygen during sleep

## 2017-08-20 NOTE — Assessment & Plan Note (Signed)
Rx for chantix starter pack will be sent to pharmacy  Significant time spent on smoking cessation counseling

## 2017-08-21 ENCOUNTER — Telehealth: Payer: Self-pay | Admitting: Pulmonary Disease

## 2017-08-21 ENCOUNTER — Telehealth (HOSPITAL_COMMUNITY): Payer: Self-pay

## 2017-08-21 NOTE — Telephone Encounter (Signed)
Tell the patient that insurance does not cover and can switch to Wellbutrin 150 twice daily x

## 2017-08-21 NOTE — Telephone Encounter (Signed)
Called and spoke with patient in regards to Pulmonary Rehab - Patient stated that she could do two days a week but would have to switch class times each class.. Then she stated that she can't miss that much work due to her financial situation. She will not be participating in program. Closed referral.

## 2017-08-21 NOTE — Telephone Encounter (Signed)
Received a PA request for patient's Chantix starter pack. Entered patient's information into CoverMyMeds and received the following message: Patient has a 35% chance of insurance paying for medication after PA. Insurance suggests trying bupropion, nicotine polacrilex and nicotine.   RA, please advise if you want me to proceed with a PA or would you like to switch the patient to one of the covered alternatives mentioned above. Thanks!

## 2017-08-21 NOTE — Telephone Encounter (Signed)
Patients insurance is active and benefits covered through [facility]. $25 co-pay, no deductible, out of pocket amount is $4,500/$4,500 has been met, no co-insurance, and no pre-authorization is required. Reference [id number]  Will be contacting and scheduling patient for Pulmonary Rehab

## 2017-08-22 ENCOUNTER — Ambulatory Visit: Payer: Commercial Managed Care - HMO | Admitting: Internal Medicine

## 2017-08-27 ENCOUNTER — Telehealth: Payer: Self-pay | Admitting: Pulmonary Disease

## 2017-08-27 MED ORDER — AZITHROMYCIN 250 MG PO TABS: ORAL_TABLET | ORAL | 0 refills | Status: DC

## 2017-08-27 NOTE — Telephone Encounter (Signed)
Spoke with patient. She would like to have the zpak on hand just in case the phlegm turns green like it has in the past. Advised patient that I would go ahead and call in this for her. She verbalized understanding. Nothing else needed at time of call.

## 2017-08-27 NOTE — Telephone Encounter (Signed)
Spoke with pt, she states her chest feels tight and coughing up phlem, white in color.. She thinks she is coming down with  a cold, denies fever but was sweating a lot last night. She states RA told her to call if she was getting sick. Please advise if we can send in an ABX.   [facility] [address]

## 2017-08-27 NOTE — Telephone Encounter (Signed)
Spoke with patient about the Chantix being denied. Offered the wellbutrin, patient declined. She stated that this happened before.   Will close this encounter.

## 2017-08-27 NOTE — Telephone Encounter (Signed)
Antibiotic needed only if she has green sputum-can call in Z-Pak. Mucinex 600 twice daily for now. Plenty of oral fluids

## 2017-09-02 ENCOUNTER — Other Ambulatory Visit: Payer: Self-pay | Admitting: Pulmonary Disease

## 2017-09-03 ENCOUNTER — Other Ambulatory Visit (INDEPENDENT_AMBULATORY_CARE_PROVIDER_SITE_OTHER): Payer: 59

## 2017-09-03 ENCOUNTER — Ambulatory Visit (INDEPENDENT_AMBULATORY_CARE_PROVIDER_SITE_OTHER): Payer: 59 | Admitting: Internal Medicine

## 2017-09-03 ENCOUNTER — Encounter: Payer: Self-pay | Admitting: Internal Medicine

## 2017-09-03 VITALS — BP 112/70 | HR 65 | Temp 98.1°F | Ht 61.0 in | Wt 172.0 lb

## 2017-09-03 DIAGNOSIS — R7302 Impaired glucose tolerance (oral): Secondary | ICD-10-CM | POA: Diagnosis not present

## 2017-09-03 DIAGNOSIS — Z23 Encounter for immunization: Secondary | ICD-10-CM

## 2017-09-03 DIAGNOSIS — I10 Essential (primary) hypertension: Secondary | ICD-10-CM

## 2017-09-03 DIAGNOSIS — Z0000 Encounter for general adult medical examination without abnormal findings: Secondary | ICD-10-CM

## 2017-09-03 DIAGNOSIS — D751 Secondary polycythemia: Secondary | ICD-10-CM

## 2017-09-03 DIAGNOSIS — Z0001 Encounter for general adult medical examination with abnormal findings: Secondary | ICD-10-CM

## 2017-09-03 DIAGNOSIS — J441 Chronic obstructive pulmonary disease with (acute) exacerbation: Secondary | ICD-10-CM | POA: Diagnosis not present

## 2017-09-03 LAB — CBC WITH DIFFERENTIAL/PLATELET
BASOS ABS: 0.1 10*3/uL (ref 0.0–0.1)
Basophils Relative: 1 % (ref 0.0–3.0)
EOS ABS: 0.3 10*3/uL (ref 0.0–0.7)
EOS PCT: 2.8 % (ref 0.0–5.0)
HCT: 52.1 % — ABNORMAL HIGH (ref 36.0–46.0)
HEMOGLOBIN: 16.8 g/dL — AB (ref 12.0–15.0)
LYMPHS ABS: 2.8 10*3/uL (ref 0.7–4.0)
Lymphocytes Relative: 29.8 % (ref 12.0–46.0)
MCHC: 32.3 g/dL (ref 30.0–36.0)
MCV: 88.7 fl (ref 78.0–100.0)
MONO ABS: 0.7 10*3/uL (ref 0.1–1.0)
Monocytes Relative: 7.7 % (ref 3.0–12.0)
NEUTROS PCT: 58.7 % (ref 43.0–77.0)
Neutro Abs: 5.5 10*3/uL (ref 1.4–7.7)
Platelets: 186 10*3/uL (ref 150.0–400.0)
RBC: 5.88 Mil/uL — AB (ref 3.87–5.11)
RDW: 14.9 % (ref 11.5–15.5)
WBC: 9.4 10*3/uL (ref 4.0–10.5)

## 2017-09-03 LAB — LIPID PANEL
Cholesterol: 237 mg/dL — ABNORMAL HIGH (ref 0–200)
HDL: 29 mg/dL — ABNORMAL LOW (ref >39.00)
NonHDL: 207.96
Total CHOL/HDL Ratio: 8
Triglycerides: 302 mg/dL — ABNORMAL HIGH (ref 0.0–149.0)
VLDL: 60.4 mg/dL — ABNORMAL HIGH (ref 0.0–40.0)

## 2017-09-03 LAB — BASIC METABOLIC PANEL
BUN: 13 mg/dL (ref 6–23)
CALCIUM: 9.5 mg/dL (ref 8.4–10.5)
CO2: 35 meq/L — AB (ref 19–32)
Chloride: 98 mEq/L (ref 96–112)
Creatinine, Ser: 0.58 mg/dL (ref 0.40–1.20)
GFR: 112.05 mL/min (ref >60.00)
GLUCOSE: 116 mg/dL — AB (ref 70–99)
POTASSIUM: 3.9 meq/L (ref 3.5–5.1)
Sodium: 140 mEq/L (ref 135–145)

## 2017-09-03 LAB — URINALYSIS, ROUTINE W REFLEX MICROSCOPIC
BILIRUBIN URINE: NEGATIVE
HGB URINE DIPSTICK: NEGATIVE
KETONES UR: NEGATIVE
LEUKOCYTES UA: NEGATIVE
Nitrite: NEGATIVE
RBC / HPF: NONE SEEN (ref 0–?)
Specific Gravity, Urine: 1.02 (ref 1.000–1.030)
TOTAL PROTEIN, URINE-UPE24: NEGATIVE
URINE GLUCOSE: NEGATIVE
UROBILINOGEN UA: 0.2 (ref 0.0–1.0)
pH: 6 (ref 5.0–8.0)

## 2017-09-03 LAB — HEPATIC FUNCTION PANEL
ALT: 34 U/L (ref 0–35)
AST: 21 U/L (ref 0–37)
Albumin: 4.4 g/dL (ref 3.5–5.2)
Alkaline Phosphatase: 97 U/L (ref 39–117)
BILIRUBIN DIRECT: 0.1 mg/dL (ref 0.0–0.3)
BILIRUBIN TOTAL: 0.5 mg/dL (ref 0.2–1.2)
Total Protein: 7.3 g/dL (ref 6.0–8.3)

## 2017-09-03 LAB — LDL CHOLESTEROL, DIRECT: Direct LDL: 157 mg/dL

## 2017-09-03 LAB — TSH: TSH: 3.57 u[IU]/mL (ref 0.35–4.50)

## 2017-09-03 LAB — HEMOGLOBIN A1C: Hgb A1c MFr Bld: 6.7 % — ABNORMAL HIGH (ref 4.6–6.5)

## 2017-09-03 MED ORDER — PREDNISONE 10 MG PO TABS: ORAL_TABLET | ORAL | 0 refills | Status: DC

## 2017-09-03 MED ORDER — HYDROCODONE-HOMATROPINE 5-1.5 MG/5ML PO SYRP: 5.0000 mL | ORAL_SOLUTION | Freq: Four times a day (QID) | ORAL | 0 refills | Status: AC | PRN

## 2017-09-03 MED ORDER — LEVOFLOXACIN 500 MG PO TABS: 500.0000 mg | ORAL_TABLET | Freq: Every day | ORAL | 0 refills | Status: AC

## 2017-09-03 NOTE — Patient Instructions (Signed)
Please take all new medication as prescribed - the antibiotic, cough medicine, and prednisone  Please continue all other medications as before, and refills have been done if requested.  Please have the pharmacy call with any other refills you may need.  Please continue your efforts at being more active, low cholesterol diet, and weight control.  You are otherwise up to date with prevention measures today.  Please keep your appointments with your specialists as you may have planned  Please go to the LAB in the Basement (turn left off the elevator) for the tests to be done today  You will be contacted by phone if any changes need to be made immediately.  Otherwise, you will receive a letter about your results with an explanation, but please check with MyChart first.  Please remember to sign up for MyChart if you have not done so, as this will be important to you in the future with finding out test results, communicating by private email, and scheduling acute appointments online when needed.  Please return in 1 year for your yearly visit, or sooner if needed, with Lab testing done 3-5 days before

## 2017-09-03 NOTE — Progress Notes (Signed)
Subjective:    Patient ID: [name], female    DOB: [birth year], 61 y.o.   MRN: [id number]  HPI  Here for wellness and f/u;  Overall doing ok;  Pt denies Chest pain, worsening SOB, DOE, wheezing, orthopnea, PND, worsening LE edema, palpitations, dizziness or syncope.  Pt denies neurological change such as new headache, facial or extremity weakness.  Pt denies polydipsia, polyuria, or low sugar symptoms. Pt states overall good compliance with treatment and medications, good tolerability, and has been trying to follow appropriate diet.  Pt denies worsening depressive symptoms, suicidal ideation or panic. No fever, night sweats, wt loss, loss of appetite, or other constitutional symptoms.  Pt states good ability with ADL's, has low fall risk, home safety reviewed and adequate, no other significant changes in hearing or vision, and only occasionally active with exercise.  Declines Tdap. Did have recent zpack but still c/o 3 days gradually worsening prod cough and wheezing and increased neb use, without fever , chills, ST or CP.  Past Medical History:  Diagnosis Date  . Bronchitis   . CAD (coronary artery disease) 09/03/2013  . CHF (congestive heart failure) ([place])   . COPD (chronic obstructive pulmonary disease) ([place])   . Coronary artery disease   . Headache   . Heart disease   . Hyperlipidemia   . Hypertension   . Impaired glucose tolerance 08/20/2014  . Sinusitis    Past Surgical History:  Procedure Laterality Date  . CORONARY ARTERY BYPASS GRAFT  2003   Triple bypass  . ESOPHAGOGASTRODUODENOSCOPY (EGD) Left 06/13/2014   Performed by Juanita Craver, MD at [place]  . RIGID ESOPHAGOSCOPY WITH FOREIGN BODY REMOVAL N/A 06/12/2014   Performed by Ascencion Dike, MD at [place]    reports that she quit smoking about 5 months ago. Her smoking use included cigarettes. She has a 20.00 pack-year smoking history. she has never used smokeless tobacco. She reports that she drinks about 3.0 - 3.6 oz of  alcohol per week. She reports that she uses drugs. Drug: Marijuana. family history includes Brain cancer in her mother; Cancer in her maternal grandmother and unknown relative; Heart disease in her mother; Lung cancer in her maternal aunt and paternal uncle; Stroke in her other; Suicidality in her brother. No Known Allergies Current Outpatient Medications on File Prior to Visit  Medication Sig Dispense Refill  . acetaminophen (TYLENOL) 500 MG tablet Take 1,000 mg by mouth at bedtime as needed for headache.    . albuterol (PROVENTIL HFA) 108 (90 Base) MCG/ACT inhaler INHALE 1-2 PUFFS INTO THE LUNGS EVERY 6 (SIX) HOURS AS NEEDED FOR WHEEZING OR SHORTNESS OF BREATH. 6.7 Inhaler 5  . amLODipine (NORVASC) 5 MG tablet TAKE 1 TABLET BY MOUTH EVERY DAY 30 tablet 10  . ANORO ELLIPTA 62.5-25 MCG/INH AEPB TAKE 1 PUFF BY MOUTH EVERY DAY 60 each 3  . aspirin EC 81 MG tablet Take 1 tablet (81 mg total) by mouth daily. 30 tablet 0  . azithromycin (ZITHROMAX) 250 MG tablet Take 2 tablets on first day, then take 1 tablet daily until finished. 6 tablet 0  . carvedilol (COREG) 12.5 MG tablet TAKE 1 TABLET (12.5 MG TOTAL) BY MOUTH 2 (TWO) TIMES DAILY. 60 tablet 10  . cholecalciferol (VITAMIN D) 1000 units tablet Take 1,000 Units by mouth daily.    . cyclobenzaprine (FLEXERIL) 5 MG tablet Take 1 tablet (5 mg total) by mouth 3 (three) times daily as needed for muscle spasms. 60 tablet 1  .  fluticasone (FLONASE) 50 MCG/ACT nasal spray PLACE 2 SPRAYS INTO BOTH NOSTRILS DAILY. 16 g 3  . furosemide (LASIX) 40 MG tablet TAKE 1 TABLET BY MOUTH TWICE A DAY 60 tablet 3  . HYDROcodone-acetaminophen (NORCO) 10-325 MG tablet Take 1 tablet by mouth every 6 (six) hours as needed. (Patient taking differently: Take 1 tablet by mouth every 6 (six) hours as needed for moderate pain. ) 60 tablet 0  . ipratropium-albuterol (DUONEB) 0.5-2.5 (3) MG/3ML SOLN Take 3 mLs by nebulization every 6 (six) hours as needed. 360 mL 5  . isosorbide  mononitrate (IMDUR) 60 MG 24 hr tablet TAKE 1 & 1/2 TABLET BY MOUTH DAILY 45 tablet 10  . KLOR-CON M10 10 MEQ tablet TAKE 1 TABLET BY MOUTH EVERY DAY 30 tablet 3  . omega-3 acid ethyl esters (LOVAZA) 1 g capsule Take 2 g by mouth daily.    . rosuvastatin (CRESTOR) 20 MG tablet Take 1 tablet (20 mg total) by mouth daily. 90 tablet 3  . umeclidinium-vilanterol (ANORO ELLIPTA) 62.5-25 MCG/INH AEPB Inhale 1 puff into the lungs daily. 1 each 0  . varenicline (CHANTIX STARTING MONTH PAK) 0.5 MG X 11 & 1 MG X 42 tablet Take one 0.5 mg tablet by mouth once daily for 3 days, then increase to one 0.5 mg tablet twice daily for 4 days, then increase to one 1 mg tablet twice daily. 53 tablet 0  . vitamin B-12 (CYANOCOBALAMIN) 1000 MCG tablet Take 1,000 mcg by mouth daily.    . vitamin C (ASCORBIC ACID) 500 MG tablet Take 1,000 mg by mouth daily.     No current facility-administered medications on file prior to visit.    Review of Systems Constitutional: Negative for other unusual diaphoresis, sweats, appetite or weight changes HENT: Negative for other worsening hearing loss, ear pain, facial swelling, mouth sores or neck stiffness.   Eyes: Negative for other worsening pain, redness or other visual disturbance.  Respiratory: Negative for other stridor or swelling Cardiovascular: Negative for other palpitations or other chest pain  Gastrointestinal: Negative for worsening diarrhea or loose stools, blood in stool, distention or other pain Genitourinary: Negative for hematuria, flank pain or other change in urine volume.  Musculoskeletal: Negative for myalgias or other joint swelling.  Skin: Negative for other color change, or other wound or worsening drainage.  Neurological: Negative for other syncope or numbness. Hematological: Negative for other adenopathy or swelling Psychiatric/Behavioral: Negative for hallucinations, other worsening agitation, SI, self-injury, or new decreased concentration All other  system neg per pt    Objective:   Physical Exam BP 112/70   Pulse 65   Temp 98.1 F (36.7 C) (Oral)   Ht 5\' 1"  (1.549 m)   Wt 172 lb (78 kg)   SpO2 95%   BMI 32.50 kg/m  VS noted, mild ill appearing Constitutional: Pt is oriented to person, place, and time. Appears well-developed and well-nourished, in no significant distress and comfortable Head: Normocephalic and atraumatic  Eyes: Conjunctivae and EOM are normal. Pupils are equal, round, and reactive to light Right Ear: External ear normal without discharge Left Ear: External ear normal without discharge Nose: Nose without discharge or deformity Mouth/Throat: Oropharynx is without other ulcerations and moist  Neck: Normal range of motion. Neck supple. No JVD present. No tracheal deviation present or significant neck LA or mass Cardiovascular: Normal rate, regular rhythm, normal heart sounds and intact distal pulses.   Pulmonary/Chest: WOB normal and breath sounds decreased without rales but with mild scattered wheezing  Abdominal: Soft. Bowel sounds are normal. NT. No HSM  Musculoskeletal: Normal range of motion. Exhibits no edema Lymphadenopathy: Has no other cervical adenopathy.  Neurological: Pt is alert and oriented to person, place, and time. Pt has normal reflexes. No cranial nerve deficit. Motor grossly intact, Gait intact Skin: Skin is warm and dry. No rash noted or new ulcerations Psychiatric:  Has normal mood and affect. Behavior is normal without agitation No other exam findings     Assessment & Plan:

## 2017-09-05 ENCOUNTER — Other Ambulatory Visit: Payer: Self-pay | Admitting: Internal Medicine

## 2017-09-05 ENCOUNTER — Telehealth: Payer: Self-pay | Admitting: Internal Medicine

## 2017-09-05 NOTE — Telephone Encounter (Signed)
Pt called in and said that she threw her predniSONE (DELTASONE) 10 MG tablet [[id number]] away accidentally when she was getting rid of her old meds.

## 2017-09-05 NOTE — Telephone Encounter (Signed)
Redo prednisone - done erx

## 2017-09-05 NOTE — Telephone Encounter (Signed)
Patient states she mixed up her lasix and prednisone in one bottle and cant tell the two apart.  She is requesting another [facility] [address]  I have advised patient to contact pharmacy to see if pharmacist could separate for her. . . . .but patient still requesting script.

## 2017-09-05 NOTE — Telephone Encounter (Signed)
alreeady done

## 2017-09-06 NOTE — Assessment & Plan Note (Signed)

## 2017-09-06 NOTE — Assessment & Plan Note (Signed)
stable overall by history and exam, recent data reviewed with pt, and pt to continue medical treatment as before,  to f/u any worsening symptoms or concerns Lab Results  Component Value Date   HGBA1C 6.7 (H) 09/03/2017

## 2017-09-06 NOTE — Assessment & Plan Note (Addendum)
Mild to mod, for antibx course, predpac asd, cough med prn, to f/u any worsening symptoms or concerns  In addition to the time spent performing CPE, I spent an additional 25 minutes face to face,in which greater than 50% of this time was spent in counseling and coordination of care for patient's acute illness as documented., including the differential dx, tx, further evaluation and other management of copd exacerbation, HTN, hyperglycemia, and polycythemia

## 2017-09-06 NOTE — Assessment & Plan Note (Signed)
For f/u lab, consider heme referral

## 2017-09-06 NOTE — Assessment & Plan Note (Signed)
stable overall by history and exam, recent data reviewed with pt, and pt to continue medical treatment as before,  to f/u any worsening symptoms or concerns BP Readings from Last 3 Encounters:  09/03/17 112/70  08/20/17 120/76  04/30/17 120/70

## 2017-09-22 ENCOUNTER — Encounter: Payer: Self-pay | Admitting: Internal Medicine

## 2017-09-22 MED ORDER — ROSUVASTATIN CALCIUM 20 MG PO TABS
20.0000 mg | ORAL_TABLET | Freq: Every day | ORAL | 3 refills | Status: DC
Start: 2017-09-22 — End: 2018-04-13

## 2017-09-22 NOTE — Telephone Encounter (Signed)
I refilled the crestor  Not sure what to way about her breathing;  She may need antibiotic or further steroid, but hard to tell  Please ask pt to consider OV - ok for addon tomorrow

## 2017-09-23 ENCOUNTER — Ambulatory Visit: Payer: 59 | Admitting: Nurse Practitioner

## 2017-09-23 ENCOUNTER — Ambulatory Visit (INDEPENDENT_AMBULATORY_CARE_PROVIDER_SITE_OTHER)
Admission: RE | Admit: 2017-09-23 | Discharge: 2017-09-23 | Disposition: A | Discharge status: Home / Self Care | Payer: 59 | Source: Ambulatory Visit | Attending: Nurse Practitioner | Admitting: Nurse Practitioner

## 2017-09-23 ENCOUNTER — Encounter: Payer: Self-pay | Admitting: Nurse Practitioner

## 2017-09-23 VITALS — BP 122/70 | HR 70 | Temp 99.2°F | Resp 20 | Ht 61.0 in | Wt 175.7 lb

## 2017-09-23 DIAGNOSIS — J988 Other specified respiratory disorders: Secondary | ICD-10-CM

## 2017-09-23 MED ORDER — DOXYCYCLINE HYCLATE 100 MG PO TABS: 100.0000 mg | ORAL_TABLET | Freq: Two times a day (BID) | ORAL | 0 refills | Status: DC

## 2017-09-23 NOTE — Patient Instructions (Addendum)
Please head downstairs for x-ray.  Please begin doxycycline 100mg  twice daily for 7 days for chest infection.  Please follow up if you develop fevers over 101, if you start feeling worse, or if no improvement in your symptoms by Friday.  Follow these instructions at home:  Take medicines only as told by your doctor.  Gargle warm saltwater or take cough drops to comfort your throat as told by your doctor.  Use a warm mist humidifier or inhale steam from a shower to increase air moisture. This may make it easier to breathe.  Drink enough fluid to keep your pee (urine) clear or pale yellow.  Eat soups and other clear broths.  Have a healthy diet.  Rest as needed.  Use your inhaler more if you have asthma.  Do not use any tobacco products, including cigarettes, chewing tobacco, or electronic cigarettes. If you need help quitting, ask your doctor. Contact a doctor if:  You are getting worse, not better.  Your symptoms are not helped by medicine.  You have chills.  You are getting more short of breath.  You have brown or red mucus.  You have yellow or brown discharge from your nose.  You have pain in your face, especially when you bend forward.  You have a fever.  You have puffy (swollen) neck glands.  You have pain while swallowing.  You have white areas in the back of your throat. Get help right away if:  You have very bad or constant: ? Headache. ? Ear pain. ? Pain in your forehead, behind your eyes, and over your cheekbones (sinus pain). ? Chest pain.  You have long-lasting (chronic) lung disease and any of the following: ? Wheezing. ? Long-lasting cough. ? Coughing up blood. ? A change in your usual mucus.  You have a stiff neck.  You have changes in your: ? Vision. ? Hearing. ? Thinking. ? Mood. This information is not intended to replace advice given to you by your health care provider. Make sure you discuss any questions you have with your health  care provider. Document Released: 03/25/2008 Document Revised: 06/09/2016 Document Reviewed: 01/12/2014 Elsevier Interactive Patient Education  2018 [employer].

## 2017-09-23 NOTE — Progress Notes (Signed)
Subjective:    Patient ID: [name], female    DOB: 1956-09-21, 61 y.o.   MRN: [id number]  HPI [name] is a 61 yo female who presents today for an acute visit. She has a chief complaint of shortness of breath.  The shortness of breath began about 1 month ago. The shortness of breath is associated with wheezing, productive cough, fatigue, low grade fevers, upper back pain. She is coughing up green to brown mucous. She denies weakness, nasal congestion, rhinorrhea, hemoptysis, chest pain, palpitations, nausea, vomiting, edema. She was seen by her pulmonologist for this about 3 weeks ago and given z-pak, then her PCP about 2 weeks ago and given prednisone course but she feels she has not improved. She spots checks her O2 sats at home and had reading of 88% yesterday.   She is maintained on anoro daily and duonebs prn for her COPD. She reports daily compliance with anoro and has been using her duonebs as needed at home this week with improvement in her breathing and decreased wheezing.   Review of Systems  See HPI  Past Medical History:  Diagnosis Date  . Bronchitis   . CAD (coronary artery disease) 09/03/2013  . CHF (congestive heart failure) ([place])   . COPD (chronic obstructive pulmonary disease) ([place])   . Coronary artery disease   . Headache   . Heart disease   . Hyperlipidemia   . Hypertension   . Impaired glucose tolerance 08/20/2014  . Sinusitis      Social History   Socioeconomic History  . Marital status: Single    Spouse name: Not on file  . Number of children: Not on file  . Years of education: Not on file  . Highest education level: Not on file  Social Needs  . Financial resource strain: Not on file  . Food insecurity - worry: Not on file  . Food insecurity - inability: Not on file  . Transportation needs - medical: Not on file  . Transportation needs - non-medical: Not on file  Occupational History  . Not on file  Tobacco Use  . Smoking status: Former  Smoker    Packs/day: 0.50    Years: 40.00    Pack years: 20.00    Types: Cigarettes    Last attempt to quit: 04/04/2017    Years since quitting: 0.4  . Smokeless tobacco: Never Used  Substance and Sexual Activity  . Alcohol use: Yes    Alcohol/week: 3.0 - 3.6 oz    Types: 5 - 6 Glasses of wine per week  . Drug use: Yes    Types: Marijuana    Comment: as a teenager  . Sexual activity: Not Currently    Birth control/protection: None  Other Topics Concern  . Not on file  Social History Narrative  . Not on file    Past Surgical History:  Procedure Laterality Date  . CORONARY ARTERY BYPASS GRAFT  2003   Triple bypass  . ESOPHAGOGASTRODUODENOSCOPY Left 06/13/2014   Procedure: ESOPHAGOGASTRODUODENOSCOPY (EGD);  Surgeon: Juanita Craver, MD;  Location: [facility] ENDOSCOPY;  Service: Endoscopy;  Laterality: Left;  . RIGID ESOPHAGOSCOPY N/A 06/12/2014   Procedure: RIGID ESOPHAGOSCOPY WITH FOREIGN BODY REMOVAL;  Surgeon: Ascencion Dike, MD;  Location: [facility] OR;  Service: ENT;  Laterality: N/A;    Family History  Problem Relation Age of Onset  . Lung cancer Paternal Uncle   . Heart disease Mother   . Brain cancer Mother   . Cancer  Maternal Grandmother        colon  . Stroke Other   . Cancer Unknown        x 4 aunts  . Lung cancer Maternal Aunt   . Suicidality Brother     No Known Allergies  Current Outpatient Medications on File Prior to Visit  Medication Sig Dispense Refill  . acetaminophen (TYLENOL) 500 MG tablet Take 1,000 mg by mouth at bedtime as needed for headache.    . albuterol (PROVENTIL HFA) 108 (90 Base) MCG/ACT inhaler INHALE 1-2 PUFFS INTO THE LUNGS EVERY 6 (SIX) HOURS AS NEEDED FOR WHEEZING OR SHORTNESS OF BREATH. 6.7 Inhaler 5  . amLODipine (NORVASC) 5 MG tablet TAKE 1 TABLET BY MOUTH EVERY DAY 30 tablet 10  . ANORO ELLIPTA 62.5-25 MCG/INH AEPB TAKE 1 PUFF BY MOUTH EVERY DAY 60 each 3  . aspirin EC 81 MG tablet Take 1 tablet (81 mg total) by mouth daily. 30 tablet 0  .  carvedilol (COREG) 12.5 MG tablet TAKE 1 TABLET (12.5 MG TOTAL) BY MOUTH 2 (TWO) TIMES DAILY. 60 tablet 10  . cholecalciferol (VITAMIN D) 1000 units tablet Take 1,000 Units by mouth daily.    . cyclobenzaprine (FLEXERIL) 5 MG tablet Take 1 tablet (5 mg total) by mouth 3 (three) times daily as needed for muscle spasms. 60 tablet 1  . fluticasone (FLONASE) 50 MCG/ACT nasal spray PLACE 2 SPRAYS INTO BOTH NOSTRILS DAILY. 16 g 3  . furosemide (LASIX) 40 MG tablet TAKE 1 TABLET BY MOUTH TWICE A DAY 60 tablet 3  . HYDROcodone-acetaminophen (NORCO) 10-325 MG tablet Take 1 tablet by mouth every 6 (six) hours as needed. (Patient taking differently: Take 1 tablet by mouth every 6 (six) hours as needed for moderate pain. ) 60 tablet 0  . ipratropium-albuterol (DUONEB) 0.5-2.5 (3) MG/3ML SOLN Take 3 mLs by nebulization every 6 (six) hours as needed. 360 mL 5  . isosorbide mononitrate (IMDUR) 60 MG 24 hr tablet TAKE 1 & 1/2 TABLET BY MOUTH DAILY 45 tablet 10  . KLOR-CON M10 10 MEQ tablet TAKE 1 TABLET BY MOUTH EVERY DAY 30 tablet 3  . omega-3 acid ethyl esters (LOVAZA) 1 g capsule Take 2 g by mouth daily.    . predniSONE (DELTASONE) 10 MG tablet 3 TABS BY MOUTH PER DAY FOR 3 DAYS,2TABS PER DAY FOR 3 DAYS,1TAB PER DAY FOR 3 DAYS 18 tablet 0  . rosuvastatin (CRESTOR) 20 MG tablet Take 1 tablet (20 mg total) by mouth daily. 90 tablet 3  . umeclidinium-vilanterol (ANORO ELLIPTA) 62.5-25 MCG/INH AEPB Inhale 1 puff into the lungs daily. 1 each 0  . varenicline (CHANTIX STARTING MONTH PAK) 0.5 MG X 11 & 1 MG X 42 tablet Take one 0.5 mg tablet by mouth once daily for 3 days, then increase to one 0.5 mg tablet twice daily for 4 days, then increase to one 1 mg tablet twice daily. 53 tablet 0  . vitamin B-12 (CYANOCOBALAMIN) 1000 MCG tablet Take 1,000 mcg by mouth daily.    . vitamin C (ASCORBIC ACID) 500 MG tablet Take 1,000 mg by mouth daily.     No current facility-administered medications on file prior to visit.      BP 122/70 (BP Location: Left Arm, Patient Position: Sitting, Cuff Size: Large)   Pulse 70   Temp 99.2 F (37.3 C) (Oral)   Resp 20   Ht 5\' 1"  (1.549 m)   Wt 175 lb 11.2 oz (79.7 kg)   SpO2 93%  BMI 33.20 kg/m       Objective:   Physical Exam  Constitutional: She is oriented to person, place, and time. She appears well-developed and well-nourished. She does not have a sickly appearance.  HENT:  Head: Normocephalic and atraumatic.  Cardiovascular: Normal rate, regular rhythm and intact distal pulses.  Pulmonary/Chest: Effort normal.  Mild wheezing throughout -clears with cough, mild shortness of breath with exertion.  Neurological: She is alert and oriented to person, place, and time. Coordination normal.  Skin: Skin is warm and dry.  Psychiatric: She has a normal mood and affect. Judgment and thought content normal.       Assessment & Plan:  Respiratory infection - DG Chest 2 View; Future - doxycycline (VIBRA-TABS) 100 MG tablet; Take 1 tablet (100 mg total) by mouth 2 (two) times daily.  Dispense: 14 tablet; Refill: 0 Return precautions given

## 2017-09-23 NOTE — Telephone Encounter (Signed)
Pls call pt to make appt per MD msg below...[name]

## 2017-09-29 ENCOUNTER — Ambulatory Visit: Payer: 59 | Admitting: Internal Medicine

## 2017-10-01 ENCOUNTER — Telehealth: Payer: Self-pay | Admitting: Cardiology

## 2017-10-01 NOTE — Telephone Encounter (Signed)
Patient calling, states that she went to her PCP because she had a cold. Patient states that they performed a chest xray and discovered that she had an enlarged heart.

## 2017-10-01 NOTE — Telephone Encounter (Signed)
Will have Dr Marlou Porch review previous studies for comparison and any recommendations/orders.

## 2017-10-02 ENCOUNTER — Ambulatory Visit: Payer: 59 | Admitting: Cardiology

## 2017-10-02 ENCOUNTER — Encounter: Payer: Self-pay | Admitting: Cardiology

## 2017-10-02 VITALS — BP 120/78 | HR 70 | Ht 61.0 in | Wt 173.1 lb

## 2017-10-02 DIAGNOSIS — Z720 Tobacco use: Secondary | ICD-10-CM | POA: Diagnosis not present

## 2017-10-02 DIAGNOSIS — I10 Essential (primary) hypertension: Secondary | ICD-10-CM | POA: Diagnosis not present

## 2017-10-02 DIAGNOSIS — Z951 Presence of aortocoronary bypass graft: Secondary | ICD-10-CM

## 2017-10-02 NOTE — Patient Instructions (Signed)
Medication Instructions:  Your physician recommends that you continue on your current medications as directed. Please refer to the Current Medication list given to you today.   Labwork: None ordered  Testing/Procedures: None ordered  Follow-Up: Your physician wants you to follow-up in: 6 months with Dr.Skains You will receive a reminder letter in the mail two months in advance. If you don't receive a letter, please call our office to schedule the follow-up appointment.   Any Other Special Instructions Will Be Listed Below (If Applicable).     If you need a refill on your cardiac medications before your next appointment, please call your pharmacy.   

## 2017-10-02 NOTE — Progress Notes (Signed)
[place]. [address], [place]  [address] Phone: [phone] Fax:  [phone]  Date:  10/02/2017   ID:  Joretta, Eads [birth year], MRN [id number]  PCP:  Biagio Borg, MD   History of Present Illness: [name] is a 61 y.o. female with coronary artery disease status post bypass surgery in 02/2002, LIMA to LAD, RIMA to RCA, SVG to OM with COPD, hypertension here for followup.  Last hospitalization in August 2015 was secondary to chicken bone impaction in midesophagus.  I saw her in the hospital on 07/02/13. She was having hypertensive emergency/headache. Angina was medically managed with both beta blocker and isosorbide as well as statin and aspirin. Troponin was negative. Tobacco cessation encouraged.  At prior visit, her blood pressure was seen to be quite elevated.  Pin prick discomfort periodically in her chest. Does not sound cardiac. She reported other somatic complaints, one night she did drink too much wine and felt poor through the night, did not take her medicines, adding GERD-like sensation. This resolved. She also recalled when she had her bypass surgery in 2003 that her vein grafts would likely stay open for proximal 10 years. She is concerned about this. We discussed. Her last stress test in 2014 was overall reassuring.  02/17/17  - Recent hospitalization with COPD exacerbation shortness of breath, no chest pain. BNP was normal. Echocardiogram performed showed normal ejection fraction with grade 2 diastolic dysfunction and mild pulmonary hypertension secondary. Doing better. Still continuing to smoke.  10/02/17 -She was very concerned about her recent chest x-ray that showed mild cardiomegaly, reported as stable from prior x-ray.  I personally reviewed the x-rays and her cardiac contours do appear to be slightly enlarged but there are several reasons why this can occur including changes in focal length.  I went back and looked at her  echocardiogram measurements and they were stable, no signs of enlargement.  She was recently treated for bronchitis.  This was the reason for the x-ray.  Overall no chest pain.  Reassurance.  She was worried that she was going to have a heart attack she stated.  Wt Readings from Last 3 Encounters:  10/02/17 173 lb 1.9 oz (78.5 kg)  09/23/17 175 lb 11.2 oz (79.7 kg)  09/03/17 172 lb (78 kg)     Past Medical History:  Diagnosis Date  . Bronchitis   . CAD (coronary artery disease) 09/03/2013  . CHF (congestive heart failure) ([place])   . COPD (chronic obstructive pulmonary disease) ([place])   . Coronary artery disease   . Headache   . Heart disease   . Hyperlipidemia   . Hypertension   . Impaired glucose tolerance 08/20/2014  . Sinusitis     Past Surgical History:  Procedure Laterality Date  . CORONARY ARTERY BYPASS GRAFT  2003   Triple bypass  . ESOPHAGOGASTRODUODENOSCOPY Left 06/13/2014   Procedure: ESOPHAGOGASTRODUODENOSCOPY (EGD);  Surgeon: Juanita Craver, MD;  Location: [facility] ENDOSCOPY;  Service: Endoscopy;  Laterality: Left;  . RIGID ESOPHAGOSCOPY N/A 06/12/2014   Procedure: RIGID ESOPHAGOSCOPY WITH FOREIGN BODY REMOVAL;  Surgeon: Ascencion Dike, MD;  Location: [facility] OR;  Service: ENT;  Laterality: N/A;    Current Outpatient Medications  Medication Sig Dispense Refill  . acetaminophen (TYLENOL) 500 MG tablet Take 1,000 mg by mouth at bedtime as needed for headache.    . albuterol (PROVENTIL HFA) 108 (90 Base) MCG/ACT inhaler INHALE 1-2 PUFFS INTO THE LUNGS EVERY 6 (SIX)  HOURS AS NEEDED FOR WHEEZING OR SHORTNESS OF BREATH. 6.7 Inhaler 5  . amLODipine (NORVASC) 5 MG tablet TAKE 1 TABLET BY MOUTH EVERY DAY 30 tablet 10  . ANORO ELLIPTA 62.5-25 MCG/INH AEPB TAKE 1 PUFF BY MOUTH EVERY DAY 60 each 3  . aspirin EC 81 MG tablet Take 1 tablet (81 mg total) by mouth daily. 30 tablet 0  . carvedilol (COREG) 12.5 MG tablet TAKE 1 TABLET (12.5 MG TOTAL) BY MOUTH 2 (TWO) TIMES DAILY. 60 tablet 10  .  cholecalciferol (VITAMIN D) 1000 units tablet Take 1,000 Units by mouth daily.    . fluticasone (FLONASE) 50 MCG/ACT nasal spray PLACE 2 SPRAYS INTO BOTH NOSTRILS DAILY. 16 g 3  . furosemide (LASIX) 40 MG tablet TAKE 1 TABLET BY MOUTH TWICE A DAY 60 tablet 3  . ipratropium-albuterol (DUONEB) 0.5-2.5 (3) MG/3ML SOLN Take 3 mLs by nebulization every 6 (six) hours as needed. 360 mL 5  . isosorbide mononitrate (IMDUR) 60 MG 24 hr tablet TAKE 1 & 1/2 TABLET BY MOUTH DAILY 45 tablet 10  . KLOR-CON M10 10 MEQ tablet TAKE 1 TABLET BY MOUTH EVERY DAY 30 tablet 3  . omega-3 acid ethyl esters (LOVAZA) 1 g capsule Take 2 g by mouth daily.    . rosuvastatin (CRESTOR) 20 MG tablet Take 1 tablet (20 mg total) by mouth daily. 90 tablet 3  . vitamin B-12 (CYANOCOBALAMIN) 1000 MCG tablet Take 1,000 mcg by mouth daily.    . vitamin C (ASCORBIC ACID) 500 MG tablet Take 1,000 mg by mouth daily.     No current facility-administered medications for this visit.     Allergies:   No Known Allergies  Social History:  The patient  reports that she quit smoking about 5 months ago. Her smoking use included cigarettes. She has a 20.00 pack-year smoking history. she has never used smokeless tobacco. She reports that she drinks about 3.0 - 3.6 oz of alcohol per week. She reports that she uses drugs. Drug: Marijuana.   Family History  Problem Relation Age of Onset  . Lung cancer Paternal Uncle   . Heart disease Mother   . Brain cancer Mother   . Cancer Maternal Grandmother        colon  . Stroke Other   . Cancer Unknown        x 4 aunts  . Lung cancer Maternal Aunt   . Suicidality Brother     ROS:  Unless stated above other review of systems negative.  PHYSICAL EXAM: VS:  BP 120/78   Pulse 70   Ht 5\' 1"  (1.549 m)   Wt 173 lb 1.9 oz (78.5 kg)   SpO2 96%   BMI 32.71 kg/m  GEN: Well nourished, well developed, in no acute distress  HEENT: normal  Neck: no JVD, carotid bruits, or masses Cardiac: RRR; no  murmurs, rubs, or gallops,no edema  Respiratory:  Poor air movement B.  GI: soft, nontender, nondistended, + BS MS: no deformity or atrophy  Skin: warm and dry, no rash Neuro:  Alert and Oriented x 3, Strength and sensation are intact Psych: euthymic mood, full affect    EKG:    EKG was ordered today. 02/16/16-sinus rhythm, 61, nonspecific T-wave changes personally viewed-2 other abnormalities. Sinus bradycardia rate 56, occasional PAC, nonspecific ST-T wave changes   ECHO: 02/09/17: - Left ventricle: The cavity size was normal. Systolic function was   normal. The estimated ejection fraction was in the range of 60%  to 65%. Wall motion was normal; there were no regional wall   motion abnormalities. Features are consistent with a pseudonormal   left ventricular filling pattern, with concomitant abnormal   relaxation and increased filling pressure (grade 2 diastolic   dysfunction). Doppler parameters are consistent with high   ventricular filling pressure. - Aortic valve: Trileaflet; normal thickness, mildly calcified   leaflets. - Mitral valve: There was trivial regurgitation. - Pulmonary arteries: PA peak pressure: 32 mm Hg (S). No cardiomegaly.   ASSESSMENT AND PLAN:  Coronary artery disease  - Stable with no anginal symptoms. Doing well.  -Concerned about cardiomegaly.  Reassurance has been given.  In April we will consider repeating echocardiogram.  I looked at echocardiogram measurements and they were reassuring.  Status post CABG  - 2003, stable  - mild cardiomegaly noted on CXR. Personally viewed. Recent ECHO with no significant increase in heart size. Reassuring.   Grade 2 diastolic dysfunction  - Goes hand-in-hand with her prior CAD and smoking history as well as age.  - Continue with pressure medications, Lasix.  - BNP and troponin were normal during her recent hospitalization for COPD exacerbation.  -No changes made.  Angina  - Well controlled with isosorbide and  carvedilol.  Overall doing well.  COPD  - Recent exacerbation in late April 2018.  Recent bronchitis in December 2018.  Feeling better.  Tobacco use  - Once again strongly encourage tobacco cessation.  Discussed today.  Bone impaction of esophagus -Dr. Cyndia Bent, Dr. Benjamine Mola. Chicken bone and cartilage removed from midportion of the esophagus. 06/12/14.    Signed, Candee Furbish, MD [facility]  10/02/2017 4:17 PM

## 2017-10-16 ENCOUNTER — Telehealth: Payer: Self-pay | Admitting: Cardiology

## 2017-10-16 NOTE — Telephone Encounter (Signed)
New Message     Message came through [place]: Please advise   Reason for visit: Request an Appointment    Comments:  Saw Dr. Caesar Chestnut on Dec. 4th an x-ray of my chest was done and she suggest I see you. Please look @ the results and advise

## 2017-10-16 NOTE — Telephone Encounter (Signed)
From 10/02/17 office visit with Dr Marlou Porch.  - mild cardiomegaly noted on CXR. Personally viewed. Recent ECHO with no significant increase in heart size. Reassuring.  Pt's concerns have been addressed at recent appt.

## 2017-12-01 ENCOUNTER — Ambulatory Visit: Payer: Self-pay | Admitting: *Deleted

## 2017-12-01 NOTE — Telephone Encounter (Signed)
Noted  

## 2017-12-01 NOTE — Telephone Encounter (Signed)
Pt called for some shortness of breath. She has a hx of COPD.  She states she is also having a runny nose and sore throat with ear ache on the right.  She states she is afebrile. She is using her nebulizer. She only has a productive cough in the morning. Home care advice given to her with verbal understanding.  Advise to go the ED if more difficulty breathing, elevated temperature or weakness. She stated she would. An appointment made for tomorrow.   Reason for Disposition . [1] MODERATE longstanding difficulty breathing (e.g., speaks in phrases, SOB even at rest, pulse 100-120) AND [2] SAME as normal  Answer Assessment - Initial Assessment Questions 1. RESPIRATORY STATUS: "Describe your breathing?" (e.g., wheezing, shortness of breath, unable to speak, severe coughing)      Shortness of breath 2. ONSET: "When did this breathing problem begin?"      yesterday 3. PATTERN "Does the difficult breathing come and go, or has it been constant since it started?"      constant 4. SEVERITY: "How bad is your breathing?" (e.g., mild, moderate, severe)    - MILD: No SOB at rest, mild SOB with walking, speaks normally in sentences, can lay down, no retractions, pulse < 100.    - MODERATE: SOB at rest, SOB with minimal exertion and prefers to sit, cannot lie down flat, speaks in phrases, mild retractions, audible wheezing, pulse 100-120.    - SEVERE: Very SOB at rest, speaks in single words, struggling to breathe, sitting hunched forward, retractions, pulse > 120      moderate 5. RECURRENT SYMPTOM: "Have you had difficulty breathing before?" If so, ask: "When was the last time?" and "What happened that time?"      Yes, breathing treatments, antibiotics, last year 6. CARDIAC HISTORY: "Do you have any history of heart disease?" (e.g., heart attack, angina, bypass surgery, angioplasty)      Bypass surgery 7. LUNG HISTORY: "Do you have any history of lung disease?"  (e.g., pulmonary embolus, asthma,  emphysema)     COPD 8. CAUSE: "What do you think is causing the breathing problem?"      cold 9. OTHER SYMPTOMS: "Do you have any other symptoms? (e.g., dizziness, runny nose, cough, chest pain, fever)     Runny nose, ear ache and sore throat on the right 10. PREGNANCY: "Is there any chance you are pregnant?" "When was your last menstrual period?"       LMP 25 years 11. TRAVEL: "Have you traveled out of the country in the last month?" (e.g., travel history, exposures)       no  Protocols used: BREATHING DIFFICULTY-A-AH

## 2017-12-02 ENCOUNTER — Ambulatory Visit: Payer: 59 | Admitting: Internal Medicine

## 2017-12-02 ENCOUNTER — Encounter: Payer: Self-pay | Admitting: Internal Medicine

## 2017-12-02 VITALS — BP 126/78 | HR 60 | Temp 98.3°F | Ht 61.0 in | Wt 179.0 lb

## 2017-12-02 DIAGNOSIS — R7302 Impaired glucose tolerance (oral): Secondary | ICD-10-CM | POA: Diagnosis not present

## 2017-12-02 DIAGNOSIS — J069 Acute upper respiratory infection, unspecified: Secondary | ICD-10-CM | POA: Diagnosis not present

## 2017-12-02 DIAGNOSIS — J4489 Other specified chronic obstructive pulmonary disease: Secondary | ICD-10-CM

## 2017-12-02 DIAGNOSIS — J449 Chronic obstructive pulmonary disease, unspecified: Secondary | ICD-10-CM

## 2017-12-02 DIAGNOSIS — I10 Essential (primary) hypertension: Secondary | ICD-10-CM | POA: Diagnosis not present

## 2017-12-02 MED ORDER — AMOXICILLIN-POT CLAVULANATE 875-125 MG PO TABS: 1.0000 | ORAL_TABLET | Freq: Two times a day (BID) | ORAL | 0 refills | Status: DC

## 2017-12-02 NOTE — Progress Notes (Signed)
Subjective:    Patient ID: [name], female    DOB: Oct 10, 1956, 62 y.o.   MRN: [id number]  HPI   Here with 7 days acute onset fever, facial pain, pressure, headache, general weakness and malaise, and greenish d/c, with mild ST and cough, but pt denies chest pain, wheezing, increased sob or doe, orthopnea, PND, increased LE swelling, palpitations, dizziness or syncope.   Pt denies polydipsia, polyuria.  Pt denies new neurological symptoms such as new headache, or facial or extremity weakness or numbness   No other interval hx or new complaints Past Medical History:  Diagnosis Date  . Bronchitis   . CAD (coronary artery disease) 09/03/2013  . CHF (congestive heart failure) ([place])   . COPD (chronic obstructive pulmonary disease) ([place])   . Coronary artery disease   . Headache   . Heart disease   . Hyperlipidemia   . Hypertension   . Impaired glucose tolerance 08/20/2014  . Sinusitis    Past Surgical History:  Procedure Laterality Date  . CORONARY ARTERY BYPASS GRAFT  2003   Triple bypass  . ESOPHAGOGASTRODUODENOSCOPY Left 06/13/2014   Procedure: ESOPHAGOGASTRODUODENOSCOPY (EGD);  Surgeon: Juanita Craver, MD;  Location: [facility] ENDOSCOPY;  Service: Endoscopy;  Laterality: Left;  . RIGID ESOPHAGOSCOPY N/A 06/12/2014   Procedure: RIGID ESOPHAGOSCOPY WITH FOREIGN BODY REMOVAL;  Surgeon: Ascencion Dike, MD;  Location: [facility] OR;  Service: ENT;  Laterality: N/A;    reports that she quit smoking about 7 months ago. Her smoking use included cigarettes. She has a 20.00 pack-year smoking history. she has never used smokeless tobacco. She reports that she drinks about 3.0 - 3.6 oz of alcohol per week. She reports that she uses drugs. Drug: Marijuana. family history includes Brain cancer in her mother; Cancer in her maternal grandmother and unknown relative; Heart disease in her mother; Lung cancer in her maternal aunt and paternal uncle; Stroke in her other; Suicidality in her brother. No Known  Allergies Current Outpatient Medications on File Prior to Visit  Medication Sig Dispense Refill  . acetaminophen (TYLENOL) 500 MG tablet Take 1,000 mg by mouth at bedtime as needed for headache.    . albuterol (PROVENTIL HFA) 108 (90 Base) MCG/ACT inhaler INHALE 1-2 PUFFS INTO THE LUNGS EVERY 6 (SIX) HOURS AS NEEDED FOR WHEEZING OR SHORTNESS OF BREATH. 6.7 Inhaler 5  . amLODipine (NORVASC) 5 MG tablet TAKE 1 TABLET BY MOUTH EVERY DAY 30 tablet 10  . ANORO ELLIPTA 62.5-25 MCG/INH AEPB TAKE 1 PUFF BY MOUTH EVERY DAY 60 each 3  . aspirin EC 81 MG tablet Take 1 tablet (81 mg total) by mouth daily. 30 tablet 0  . carvedilol (COREG) 12.5 MG tablet TAKE 1 TABLET (12.5 MG TOTAL) BY MOUTH 2 (TWO) TIMES DAILY. 60 tablet 10  . cholecalciferol (VITAMIN D) 1000 units tablet Take 1,000 Units by mouth daily.    . fluticasone (FLONASE) 50 MCG/ACT nasal spray PLACE 2 SPRAYS INTO BOTH NOSTRILS DAILY. 16 g 3  . furosemide (LASIX) 40 MG tablet TAKE 1 TABLET BY MOUTH TWICE A DAY 60 tablet 3  . ipratropium-albuterol (DUONEB) 0.5-2.5 (3) MG/3ML SOLN Take 3 mLs by nebulization every 6 (six) hours as needed. 360 mL 5  . isosorbide mononitrate (IMDUR) 60 MG 24 hr tablet TAKE 1 & 1/2 TABLET BY MOUTH DAILY 45 tablet 10  . KLOR-CON M10 10 MEQ tablet TAKE 1 TABLET BY MOUTH EVERY DAY 30 tablet 3  . omega-3 acid ethyl esters (LOVAZA) 1 g capsule Take  2 g by mouth daily.    . rosuvastatin (CRESTOR) 20 MG tablet Take 1 tablet (20 mg total) by mouth daily. 90 tablet 3  . vitamin B-12 (CYANOCOBALAMIN) 1000 MCG tablet Take 1,000 mcg by mouth daily.    . vitamin C (ASCORBIC ACID) 500 MG tablet Take 1,000 mg by mouth daily.     No current facility-administered medications on file prior to visit.     Review of Systems  Constitutional: Negative for other unusual diaphoresis or sweats HENT: Negative for ear discharge or swelling Eyes: Negative for other worsening visual disturbances Respiratory: Negative for stridor or other  swelling  Gastrointestinal: Negative for worsening distension or other blood Genitourinary: Negative for retention or other urinary change Musculoskeletal: Negative for other MSK pain or swelling Skin: Negative for color change or other new lesions Neurological: Negative for worsening tremors and other numbness  Psychiatric/Behavioral: Negative for worsening agitation or other fatigue All other system neg per pt    Objective:   Physical Exam BP 126/78   Pulse 60   Temp 98.3 F (36.8 C) (Oral)   Ht 5\' 1"  (1.549 m)   Wt 179 lb (81.2 kg)   SpO2 95%   BMI 33.82 kg/m  VS noted, mild ill Constitutional: Pt appears in NAD HENT: Head: NCAT.  Right Ear: External ear normal.  Left Ear: External ear normal.  Eyes: . Pupils are equal, round, and reactive to light. Conjunctivae and EOM are normal Bilat tm's with mild erythema.  Max sinus areas mild tender.  Pharynx with mild erythema, no exudate Nose: without d/c or deformity Neck: Neck supple. Gross normal ROM Cardiovascular: Normal rate and regular rhythm.   Pulmonary/Chest: Effort normal and breath sounds without rales or wheezing.  Neurological: Pt is alert. At baseline orientation, motor grossly intact Skin: Skin is warm. No rashes, other new lesions, no LE edema Psychiatric: Pt behavior is normal without agitation  No other exam findings    Assessment & Plan:

## 2017-12-02 NOTE — Assessment & Plan Note (Signed)
stable overall by history and exam, recent data reviewed with pt, and pt to continue medical treatment as before,  to f/u any worsening symptoms or concerns Lab Results  Component Value Date   HGBA1C 6.7 (H) 09/03/2017  pt to call for worsening polys or cbg > 200 with illness

## 2017-12-02 NOTE — Assessment & Plan Note (Signed)
stable overall by history and exam, recent data reviewed with pt, and pt to continue medical treatment as before,  to f/u any worsening symptoms or concerns BP Readings from Last 3 Encounters:  12/02/17 126/78  10/02/17 120/78  09/23/17 122/70

## 2017-12-02 NOTE — Assessment & Plan Note (Signed)
Mild to mod, for antibx course,  to f/u any worsening symptoms or concerns 

## 2017-12-02 NOTE — Patient Instructions (Signed)
Please take all new medication as prescribed - the augmentin antibiotic  You can also take Delsym OTC for cough, and/or Mucinex (or it's generic off brand) for congestion, and tylenol as needed for pain.  Please continue all other medications as before, and refills have been done if requested.  Please have the pharmacy call with any other refills you may need.  Please keep your appointments with your specialists as you may have planned

## 2017-12-02 NOTE — Assessment & Plan Note (Signed)
stable overall by history and exam, recent data reviewed with pt, and pt to continue medical treatment as before,  to f/u any worsening symptoms or concerns  

## 2017-12-03 ENCOUNTER — Ambulatory Visit: Payer: Self-pay | Admitting: *Deleted

## 2017-12-03 MED ORDER — AZITHROMYCIN 250 MG PO TABS: ORAL_TABLET | ORAL | 1 refills | Status: DC

## 2017-12-03 NOTE — Addendum Note (Signed)
Addended by: Biagio Borg on: 12/03/2017 12:15 PM   Modules accepted: Orders

## 2017-12-03 NOTE — Telephone Encounter (Signed)
Pt called due to vomiting; she states that she took augmentin about 2 hours later; she also was experiencing chills and the nausea this morning; per nurse triage recommendation made for pt to go to ED; pt declined and would like for the physician to be notfied since she was seen in the office yesterday; will route to [place] for further direction; spoke with Gareth Eagle and will route to [facility]; she will notify physician; the pt can be contactd at [phone].   Reason for Disposition . [1] SEVERE vomiting (e.g., 6 or more times/day) AND [2] present > 8 hours  Answer Assessment - Initial Assessment Questions 1. VOMITING SEVERITY: "How many times have you vomited in the past 24 hours?"     - MILD:  1 - 2 times/day    - MODERATE: 3 - 5 times/day, decreased oral intake without significant weight loss or symptoms of dehydration    - SEVERE: 6 or more times/day, vomits everything or nearly everything, with significant weight loss, symptoms of dehydration      6 times last night 2. ONSET: "When did the vomiting begin?"      12/02/17 at 1930 3. FLUIDS: "What fluids or food have you vomited up today?" "Have you been able to keep any fluids down?"     none 4. ABDOMINAL PAIN: "Are your having any abdominal pain?" If yes : "How bad is it and what does it feel like?" (e.g., crampy, dull, intermittent, constant)      no 5. DIARRHEA: "Is there any diarrhea?" If so, ask: "How many times today?"      no 6. CONTACTS: "Is there anyone else in the family with the same symptoms?"      People at work  7. CAUSE: "What do you think is causing your vomiting?"     unknown 8. HYDRATION STATUS: "Any signs of dehydration?" (e.g., dry mouth [not only dry lips], too weak to stand) "When did you last urinate?"     Weak but able to stand 9. OTHER SYMPTOMS: "Do you have any other symptoms?" (e.g., fever, headache, vertigo, vomiting blood or coffee grounds, recent head injury)     Fever 101.0 last night and chills; headache  relieve with aspirin 10. PREGNANCY: "Is there any chance you are pregnant?" "When was your last menstrual period?"       no  Protocols used: [facility]

## 2017-12-03 NOTE — Telephone Encounter (Signed)
Contacted pt to make here aware that her information has been sent to Dr Jenny Reichmann. After his review she will be contacted with further instructions. Pt verbalizes understanding.

## 2017-12-03 NOTE — Telephone Encounter (Signed)
We will need to stop the augmentin  I will place on her allergy list as an intolerance  OK for change to zpack - done erx

## 2017-12-03 NOTE — Telephone Encounter (Signed)
Pt has been informed and expressed understanding.  

## 2017-12-11 ENCOUNTER — Encounter: Payer: Self-pay | Admitting: Internal Medicine

## 2018-01-03 ENCOUNTER — Telehealth: Payer: Self-pay | Admitting: Pulmonary Disease

## 2018-01-03 MED ORDER — UMECLIDINIUM-VILANTEROL 62.5-25 MCG/INH IN AEPB: INHALATION_SPRAY | RESPIRATORY_TRACT | 3 refills | Status: DC

## 2018-01-03 NOTE — Telephone Encounter (Signed)
Called for Anoro refill on a Saturday. Sent Rx for [employer].

## 2018-01-05 ENCOUNTER — Encounter: Payer: Self-pay | Admitting: Family

## 2018-01-05 ENCOUNTER — Ambulatory Visit: Payer: 59 | Admitting: Family

## 2018-01-05 VITALS — BP 122/74 | HR 56 | Temp 98.5°F | Ht 61.0 in | Wt 177.0 lb

## 2018-01-05 DIAGNOSIS — J019 Acute sinusitis, unspecified: Secondary | ICD-10-CM | POA: Diagnosis not present

## 2018-01-05 MED ORDER — LEVOFLOXACIN 500 MG PO TABS: 500.0000 mg | ORAL_TABLET | Freq: Every day | ORAL | 0 refills | Status: DC

## 2018-01-05 NOTE — Progress Notes (Signed)
[name] is a 62 y.o. female with the following history as recorded in EpicCare:  Patient Active Problem List   Diagnosis Date Noted  . Acute upper respiratory infection 12/02/2017  . COPD exacerbation ([place]) 09/03/2017  . Tobacco abuse 03/07/2017  . Sinusitis, acute 04/08/2016  . Left lumbar radiculopathy 10/19/2015  . Cough 08/29/2015  . Pedal edema 02/22/2015  . Impaired glucose tolerance 08/20/2014  . Pruritus 07/08/2014  . Nocturnal hypoxemia 07/05/2014  . Esophagus, foreign body 06/12/2014  . Polycythemia, secondary 02/16/2014  . 5 mm Lung nodule, solitary 02/02/2014  . GERD (gastroesophageal reflux disease) 02/01/2014  . Hypersomnolence 02/01/2014  . Allergic rhinitis, cause unspecified 09/03/2013  . CAD (coronary artery disease) 09/03/2013  . Encounter for well adult exam with abnormal findings 09/03/2013  . Essential hypertension 07/07/2013  . COPD (chronic obstructive pulmonary disease) with chronic bronchitis ([place]) 07/07/2013  . Status post coronary artery bypass grafting 07/07/2013  . Headache 07/01/2013  . Hypertensive emergency 07/01/2013  . Polycythemia 07/01/2013    Current Outpatient Medications  Medication Sig Dispense Refill  . acetaminophen (TYLENOL) 500 MG tablet Take 1,000 mg by mouth at bedtime as needed for headache.    . albuterol (PROVENTIL HFA) 108 (90 Base) MCG/ACT inhaler INHALE 1-2 PUFFS INTO THE LUNGS EVERY 6 (SIX) HOURS AS NEEDED FOR WHEEZING OR SHORTNESS OF BREATH. 6.7 Inhaler 5  . amLODipine (NORVASC) 5 MG tablet TAKE 1 TABLET BY MOUTH EVERY DAY 30 tablet 10  . aspirin EC 81 MG tablet Take 1 tablet (81 mg total) by mouth daily. 30 tablet 0  . carvedilol (COREG) 12.5 MG tablet TAKE 1 TABLET (12.5 MG TOTAL) BY MOUTH 2 (TWO) TIMES DAILY. 60 tablet 10  . cholecalciferol (VITAMIN D) 1000 units tablet Take 1,000 Units by mouth daily.    . fluticasone (FLONASE) 50 MCG/ACT nasal spray PLACE 2 SPRAYS INTO BOTH NOSTRILS DAILY. 16 g 3  . furosemide  (LASIX) 40 MG tablet TAKE 1 TABLET BY MOUTH TWICE A DAY 60 tablet 3  . ipratropium-albuterol (DUONEB) 0.5-2.5 (3) MG/3ML SOLN Take 3 mLs by nebulization every 6 (six) hours as needed. 360 mL 5  . isosorbide mononitrate (IMDUR) 60 MG 24 hr tablet TAKE 1 & 1/2 TABLET BY MOUTH DAILY 45 tablet 10  . KLOR-CON M10 10 MEQ tablet TAKE 1 TABLET BY MOUTH EVERY DAY 30 tablet 3  . omega-3 acid ethyl esters (LOVAZA) 1 g capsule Take 2 g by mouth daily.    . rosuvastatin (CRESTOR) 20 MG tablet Take 1 tablet (20 mg total) by mouth daily. 90 tablet 3  . umeclidinium-vilanterol (ANORO ELLIPTA) 62.5-25 MCG/INH AEPB TAKE 1 PUFF BY MOUTH EVERY DAY 60 each 3  . vitamin B-12 (CYANOCOBALAMIN) 1000 MCG tablet Take 1,000 mcg by mouth daily.    . vitamin C (ASCORBIC ACID) 500 MG tablet Take 1,000 mg by mouth daily.    Marland Kitchen levofloxacin (LEVAQUIN) 500 MG tablet Take 1 tablet (500 mg total) by mouth daily. 10 tablet 0   No current facility-administered medications for this visit.     Allergies: Augmentin [amoxicillin-pot clavulanate]  Past Medical History:  Diagnosis Date  . Bronchitis   . CAD (coronary artery disease) 09/03/2013  . CHF (congestive heart failure) ([place])   . COPD (chronic obstructive pulmonary disease) ([place])   . Coronary artery disease   . Headache   . Heart disease   . Hyperlipidemia   . Hypertension   . Impaired glucose tolerance 08/20/2014  . Sinusitis  Past Surgical History:  Procedure Laterality Date  . CORONARY ARTERY BYPASS GRAFT  2003   Triple bypass  . ESOPHAGOGASTRODUODENOSCOPY Left 06/13/2014   Procedure: ESOPHAGOGASTRODUODENOSCOPY (EGD);  Surgeon: Juanita Craver, MD;  Location: [facility] ENDOSCOPY;  Service: Endoscopy;  Laterality: Left;  . RIGID ESOPHAGOSCOPY N/A 06/12/2014   Procedure: RIGID ESOPHAGOSCOPY WITH FOREIGN BODY REMOVAL;  Surgeon: Ascencion Dike, MD;  Location: [facility] OR;  Service: ENT;  Laterality: N/A;    Family History  Problem Relation Age of Onset  . Lung cancer Paternal Uncle    . Heart disease Mother   . Brain cancer Mother   . Cancer Maternal Grandmother        colon  . Stroke Other   . Cancer Unknown        x 4 aunts  . Lung cancer Maternal Aunt   . Suicidality Brother     Social History   Tobacco Use  . Smoking status: Former Smoker    Packs/day: 0.50    Years: 40.00    Pack years: 20.00    Types: Cigarettes    Last attempt to quit: 04/04/2017    Years since quitting: 0.7  . Smokeless tobacco: Never Used  Substance Use Topics  . Alcohol use: Yes    Alcohol/week: 3.0 - 3.6 oz    Types: 5 - 6 Glasses of wine per week    Subjective:  Patient presents with concerns for sinus infection; symptoms x 1 week; + sore throat; + ear pain; using OTC sweet oil for her ears with some benefit; denies any chest pain or shortness of breath. Was treated for URI in mid-February with Z-pak and not sure infection cleared completely;     Objective:  Vitals:   01/05/18 1639  BP: 122/74  Pulse: (!) 56  Temp: 98.5 F (36.9 C)  TempSrc: Oral  SpO2: 98%  Weight: 177 lb (80.3 kg)  Height: 5\' 1"  (1.549 m)    General: Well developed, well nourished, in no acute distress  Skin : Warm and dry.  Head: Normocephalic and atraumatic  Eyes: Sclera and conjunctiva clear; pupils round and reactive to light; extraocular movements intact  Ears: External normal; canals clear; tympanic membranes normal  Oropharynx: Pink, supple. No suspicious lesions  Neck: Supple without thyromegaly, adenopathy  Lungs: Respirations unlabored; clear to auscultation bilaterally without wheeze, rales, rhonchi  CVS exam: normal rate and regular rhythm.  Musculoskeletal: No deformities; no active joint inflammation  Extremities: No edema, cyanosis, clubbing  Vessels: Symmetric bilaterally  Neurologic: Alert and oriented; speech intact; face symmetrical; moves all extremities well; CNII-XII intact without focal deficit  Assessment:  1. Acute sinusitis, recurrence not specified, unspecified  location     Plan:  Rx for Levaquin 500 mg qd x 10 days; encouraged to use her Flonase regularly; follow-up worse, no better.   No Follow-up on file.  No orders of the defined types were placed in this encounter.   Requested Prescriptions   Signed Prescriptions Disp Refills  . levofloxacin (LEVAQUIN) 500 MG tablet 10 tablet 0    Sig: Take 1 tablet (500 mg total) by mouth daily.

## 2018-01-11 ENCOUNTER — Encounter (HOSPITAL_COMMUNITY): Payer: Self-pay | Admitting: Emergency Medicine

## 2018-01-11 ENCOUNTER — Other Ambulatory Visit: Payer: Self-pay

## 2018-01-11 ENCOUNTER — Emergency Department (HOSPITAL_COMMUNITY): Payer: 59

## 2018-01-11 ENCOUNTER — Emergency Department (HOSPITAL_COMMUNITY)
Admission: EM | Admit: 2018-01-11 | Discharge: 2018-01-11 | Disposition: A | Discharge status: Home / Self Care | Payer: 59 | Attending: Emergency Medicine | Admitting: Emergency Medicine

## 2018-01-11 DIAGNOSIS — Z79899 Other long term (current) drug therapy: Secondary | ICD-10-CM | POA: Insufficient documentation

## 2018-01-11 DIAGNOSIS — J441 Chronic obstructive pulmonary disease with (acute) exacerbation: Secondary | ICD-10-CM | POA: Diagnosis not present

## 2018-01-11 DIAGNOSIS — I2510 Atherosclerotic heart disease of native coronary artery without angina pectoris: Secondary | ICD-10-CM | POA: Insufficient documentation

## 2018-01-11 DIAGNOSIS — R0602 Shortness of breath: Secondary | ICD-10-CM | POA: Diagnosis present

## 2018-01-11 DIAGNOSIS — I10 Essential (primary) hypertension: Secondary | ICD-10-CM | POA: Diagnosis not present

## 2018-01-11 DIAGNOSIS — F17210 Nicotine dependence, cigarettes, uncomplicated: Secondary | ICD-10-CM | POA: Diagnosis not present

## 2018-01-11 DIAGNOSIS — Z951 Presence of aortocoronary bypass graft: Secondary | ICD-10-CM | POA: Diagnosis not present

## 2018-01-11 DIAGNOSIS — Z7982 Long term (current) use of aspirin: Secondary | ICD-10-CM | POA: Diagnosis not present

## 2018-01-11 LAB — CBC
HEMATOCRIT: 50.2 % — AB (ref 36.0–46.0)
HEMOGLOBIN: 16.3 g/dL — AB (ref 12.0–15.0)
MCH: 29.7 pg (ref 26.0–34.0)
MCHC: 32.5 g/dL (ref 30.0–36.0)
MCV: 91.6 fL (ref 78.0–100.0)
Platelets: 156 10*3/uL (ref 150–400)
RBC: 5.48 MIL/uL — ABNORMAL HIGH (ref 3.87–5.11)
RDW: 13.9 % (ref 11.5–15.5)
WBC: 8.6 10*3/uL (ref 4.0–10.5)

## 2018-01-11 LAB — BASIC METABOLIC PANEL
ANION GAP: 10 (ref 5–15)
BUN: 8 mg/dL (ref 6–20)
CALCIUM: 9 mg/dL (ref 8.9–10.3)
CO2: 30 mmol/L (ref 22–32)
Chloride: 101 mmol/L (ref 101–111)
Creatinine, Ser: 0.5 mg/dL (ref 0.44–1.00)
GFR calc Af Amer: >60 mL/min (ref >60)
GFR calc non Af Amer: >60 mL/min (ref >60)
GLUCOSE: 145 mg/dL — AB (ref 65–99)
Potassium: 4.2 mmol/L (ref 3.5–5.1)
Sodium: 141 mmol/L (ref 135–145)

## 2018-01-11 LAB — I-STAT TROPONIN, ED: Troponin i, poc: 0 ng/mL (ref 0.00–0.08)

## 2018-01-11 LAB — BRAIN NATRIURETIC PEPTIDE: B Natriuretic Peptide: 70 pg/mL (ref 0.0–100.0)

## 2018-01-11 MED ORDER — IPRATROPIUM-ALBUTEROL 0.5-2.5 (3) MG/3ML IN SOLN
3.0000 mL | Freq: Once | RESPIRATORY_TRACT | Status: AC
  Administered 2018-01-11: 3 mL via RESPIRATORY_TRACT
  Filled 2018-01-11: qty 3

## 2018-01-11 MED ORDER — SODIUM CHLORIDE 0.9 % IV SOLN
INTRAVENOUS | Status: DC
Start: 2018-01-11 — End: 2018-01-11

## 2018-01-11 MED ORDER — PREDNISONE 50 MG PO TABS: ORAL_TABLET | ORAL | 0 refills | Status: DC

## 2018-01-11 MED ORDER — METHYLPREDNISOLONE SODIUM SUCC 125 MG IJ SOLR
125.0000 mg | Freq: Once | INTRAMUSCULAR | Status: AC
  Administered 2018-01-11: 125 mg via INTRAVENOUS
  Filled 2018-01-11: qty 2

## 2018-01-11 NOTE — ED Provider Notes (Signed)
[place] EMERGENCY DEPARTMENT Provider Note   CSN: [id number] Arrival date & time: 01/11/18  5701     History   Chief Complaint Chief Complaint  Patient presents with  . Shortness of Breath  . Cough  . Chest Pain  . COPD    HPI   Blood pressure (!) 146/70, pulse 69, temperature 98.5 F (36.9 C), temperature source Oral, height 5\' 1"  (1.549 m), weight 80.3 kg (177 lb), SpO2 91 %.  [name] is a 62 y.o. female with past medical history significant for CAD, CHF, COPD (former smoker, on 2 L via nasal cannula at night and as needed) complaining of increasing shortness of breath over the course of last 24 hours.  States that she was shopping with her sister yesterday her oxygen level went down to 87%, she checked it because she felt so dyspneic on exertion, she self-administered her oxygen with some relief.  She is being treated for an upper respiratory infection with 10 days of Levaquin, she is completed 8 days.  She endorses tactile fever and chills with rhinorrhea and what she describes as postnasal drip with cough.  She states that she does have some fluttering and chest pressure intermittently over the last several days, it is not active right now.  There is no associated diaphoresis, nausea or vomiting.  No associated increasing peripheral edema, orthopnea, PND.  She is been compliant with her Lasix.  She is having to use her oxygen at all times, this is atypical for her.   Past Medical History:  Diagnosis Date  . Bronchitis   . CAD (coronary artery disease) 09/03/2013  . CHF (congestive heart failure) ([place])   . COPD (chronic obstructive pulmonary disease) ([place])   . Coronary artery disease   . Headache   . Heart disease   . Hyperlipidemia   . Hypertension   . Impaired glucose tolerance 08/20/2014  . Sinusitis     Patient Active Problem List   Diagnosis Date Noted  . Acute upper respiratory infection 12/02/2017  . COPD exacerbation ([place])  09/03/2017  . Tobacco abuse 03/07/2017  . Sinusitis, acute 04/08/2016  . Left lumbar radiculopathy 10/19/2015  . Cough 08/29/2015  . Pedal edema 02/22/2015  . Impaired glucose tolerance 08/20/2014  . Pruritus 07/08/2014  . Nocturnal hypoxemia 07/05/2014  . Esophagus, foreign body 06/12/2014  . Polycythemia, secondary 02/16/2014  . 5 mm Lung nodule, solitary 02/02/2014  . GERD (gastroesophageal reflux disease) 02/01/2014  . Hypersomnolence 02/01/2014  . Allergic rhinitis, cause unspecified 09/03/2013  . CAD (coronary artery disease) 09/03/2013  . Encounter for well adult exam with abnormal findings 09/03/2013  . Essential hypertension 07/07/2013  . COPD (chronic obstructive pulmonary disease) with chronic bronchitis ([place]) 07/07/2013  . Status post coronary artery bypass grafting 07/07/2013  . Headache 07/01/2013  . Hypertensive emergency 07/01/2013  . Polycythemia 07/01/2013    Past Surgical History:  Procedure Laterality Date  . CORONARY ARTERY BYPASS GRAFT  2003   Triple bypass  . ESOPHAGOGASTRODUODENOSCOPY Left 06/13/2014   Procedure: ESOPHAGOGASTRODUODENOSCOPY (EGD);  Surgeon: Juanita Craver, MD;  Location[facility] ENDOSCOPY;  Service: Endoscopy;  Laterality: Left;  . RIGID ESOPHAGOSCOPY N/A 06/12/2014   Procedure: RIGID ESOPHAGOSCOPY WITH FOREIGN BODY REMOVAL;  Surgeon: Ascencion Dike, MD;  Location: [facility] OR;  Service: ENT;  Laterality: N/A;     OB History   None      Home Medications    Prior to Admission medications   Medication Sig Start Date End  Date Taking? Authorizing Provider  acetaminophen (TYLENOL) 500 MG tablet Take 1,000 mg by mouth at bedtime as needed for headache.    [provider]  albuterol (PROVENTIL HFA) 108 (90 Base) MCG/ACT inhaler INHALE 1-2 PUFFS INTO THE LUNGS EVERY 6 (SIX) HOURS AS NEEDED FOR WHEEZING OR SHORTNESS OF BREATH. 02/04/17   Biagio Borg, MD  amLODipine (NORVASC) 5 MG tablet TAKE 1 TABLET BY MOUTH EVERY DAY 03/10/17   Jerline Pain, MD    aspirin EC 81 MG tablet Take 1 tablet (81 mg total) by mouth daily. 07/02/13   Olam Idler, MD  carvedilol (COREG) 12.5 MG tablet TAKE 1 TABLET (12.5 MG TOTAL) BY MOUTH 2 (TWO) TIMES DAILY. 03/10/17   Jerline Pain, MD  cholecalciferol (VITAMIN D) 1000 units tablet Take 1,000 Units by mouth daily.    [provider]  fluticasone (FLONASE) 50 MCG/ACT nasal spray PLACE 2 SPRAYS INTO BOTH NOSTRILS DAILY. 09/22/14   Biagio Borg, MD  furosemide (LASIX) 40 MG tablet TAKE 1 TABLET BY MOUTH TWICE A DAY 07/03/17   Biagio Borg, MD  ipratropium-albuterol (DUONEB) 0.5-2.5 (3) MG/3ML SOLN Take 3 mLs by nebulization every 6 (six) hours as needed. 03/18/17   Biagio Borg, MD  isosorbide mononitrate (IMDUR) 60 MG 24 hr tablet TAKE 1 & 1/2 TABLET BY MOUTH DAILY 03/10/17   Jerline Pain, MD  KLOR-CON M10 10 MEQ tablet TAKE 1 TABLET BY MOUTH EVERY DAY 07/03/17   Biagio Borg, MD  levofloxacin (LEVAQUIN) 500 MG tablet Take 1 tablet (500 mg total) by mouth daily. 01/05/18   Marrian Salvage, FNP  omega-3 acid ethyl esters (LOVAZA) 1 g capsule Take 2 g by mouth daily.    [provider]  predniSONE (DELTASONE) 50 MG tablet Take 1 tablet daily with breakfast 01/11/18   Kavitha Lansdale, Elmyra Ricks, PA-C  rosuvastatin (CRESTOR) 20 MG tablet Take 1 tablet (20 mg total) by mouth daily. 09/22/17   Biagio Borg, MD  umeclidinium-vilanterol [facility] ELLIPTA) 62.5-25 MCG/INH AEPB TAKE 1 PUFF BY MOUTH EVERY DAY 01/03/18   Juanito Doom, MD  vitamin B-12 (CYANOCOBALAMIN) 1000 MCG tablet Take 1,000 mcg by mouth daily.    [provider]  vitamin C (ASCORBIC ACID) 500 MG tablet Take 1,000 mg by mouth daily.    [provider]    Family History Family History  Problem Relation Age of Onset  . Lung cancer Paternal Uncle   . Heart disease Mother   . Brain cancer Mother   . Cancer Maternal Grandmother        colon  . Stroke Other   . Cancer Unknown        x 4 aunts  . Lung cancer Maternal  Aunt   . Suicidality Brother     Social History Social History   Tobacco Use  . Smoking status: Former Smoker    Packs/day: 0.50    Years: 40.00    Pack years: 20.00    Types: Cigarettes    Last attempt to quit: 04/04/2017    Years since quitting: 0.7  . Smokeless tobacco: Never Used  Substance Use Topics  . Alcohol use: Yes    Alcohol/week: 3.0 - 3.6 oz    Types: 5 - 6 Glasses of wine per week  . Drug use: Yes    Types: Marijuana    Comment: as a teenager     Allergies   Augmentin [amoxicillin-pot clavulanate]   Review of Systems  Review of Systems  A complete review of systems was obtained and all systems are negative except as noted in the HPI and PMH.   Physical Exam Updated Vital Signs BP (!) 129/57   Pulse 79   Temp 98.5 F (36.9 C) (Oral)   Resp (!) 22   Ht 5\' 1"  (1.549 m)   Wt 80.3 kg (177 lb)   SpO2 97%   BMI 33.44 kg/m   Physical Exam  Constitutional: She is oriented to person, place, and time. She appears well-developed and well-nourished. No distress.  HENT:  Head: Normocephalic and atraumatic.  Mouth/Throat: Oropharynx is clear and moist.  Eyes: Pupils are equal, round, and reactive to light. Conjunctivae and EOM are normal.  Neck: Normal range of motion.  Cardiovascular: Normal rate, regular rhythm and intact distal pulses.  Pulmonary/Chest: Effort normal and breath sounds normal.  No wheezing, on 2 L via nasal cannula.  Abdominal: Soft. There is no tenderness.  Musculoskeletal: Normal range of motion.  Neurological: She is alert and oriented to person, place, and time.  Skin: She is not diaphoretic.  Psychiatric: She has a normal mood and affect.  Nursing note and vitals reviewed.    ED Treatments / Results  Labs (all labs ordered are listed, but only abnormal results are displayed) Labs Reviewed  BASIC METABOLIC PANEL - Abnormal; Notable for the following components:      Result Value   Glucose, Bld 145 (*)    All other  components within normal limits  CBC - Abnormal; Notable for the following components:   RBC 5.48 (*)    Hemoglobin 16.3 (*)    HCT 50.2 (*)    All other components within normal limits  BRAIN NATRIURETIC PEPTIDE  I-STAT TROPONIN, ED    EKG EKG Interpretation  Date/Time:  Sunday January 11 2018 08:12:29 EDT Ventricular Rate:  69 PR Interval:    QRS Duration: 80 QT Interval:  410 QTC Calculation: 440 R Axis:   107 Text Interpretation:  Sinus or ectopic atrial rhythm Probable lateral infarct, age indeterminate Anteroseptal infarct, age indeterminate Baseline wander in lead(s) V3 V5 V6 similar to prior 4/18 Confirmed by Aletta Edouard [phone]) on 01/11/2018 8:43:44 AM   Radiology Dg Chest 2 View  Result Date: 01/11/2018 CLINICAL DATA:  Shortness of breath. EXAM: CHEST - 2 VIEW COMPARISON:  09/23/2017. FINDINGS: Normal sized heart with an interval decrease in size. Stable post CABG changes. Decreased prominence of the pulmonary vasculature with stable prominence of the interstitial markings. No pleural fluid. The hemidiaphragms are flattened on the lateral view. Diffuse osteopenia. Stable linear scarring in the right upper lung zone. Small left apical calcified granuloma. IMPRESSION: 1. Improved cardiomegaly and pulmonary vascular congestion. 2. Stable mild changes of COPD. Electronically Signed   By: Claudie Revering M.D.   On: 01/11/2018 09:02    Procedures Procedures (including critical care time)  Medications Ordered in ED Medications  ipratropium-albuterol (DUONEB) 0.5-2.5 (3) MG/3ML nebulizer solution 3 mL (3 mLs Nebulization Given 01/11/18 1014)  methylPREDNISolone sodium succinate (SOLU-MEDROL) 125 mg/2 mL injection 125 mg (125 mg Intravenous Given 01/11/18 1014)     Initial Impression / Assessment and Plan / ED Course  I have reviewed the triage vital signs and the nursing notes.  Pertinent labs & imaging results that were available during my care of the patient were reviewed by  me and considered in my medical decision making (see chart for details).  Clinical Course as of Jan 11 1617  Sun Jan 05, 3164  8617 62 year old female with chronic COPD continues to smoke with increased shortness of breath.  Her PCP had her on Levaquin which she is close to completing but felt more shortness of breath today.  She is got some scattered wheezing but does not look in any distress.  She is getting labs x-ray EKG and some symptomatic treatment.  Probable discharge if we can get her feeling better.   [MB]    Clinical Course User Index [MB] Hayden Rasmussen, MD    Vitals:   01/11/18 0957 01/11/18 1015 01/11/18 1030 01/11/18 1200  BP:  137/60 126/64 (!) 129/57  Pulse:  61 66 79  Resp:  16 (!) 22 (!) 22  Temp:      TempSrc:      SpO2: 97% 100% 96% 97%  Weight:      Height:        Medications  ipratropium-albuterol (DUONEB) 0.5-2.5 (3) MG/3ML nebulizer solution 3 mL (3 mLs Nebulization Given 01/11/18 1014)  methylPREDNISolone sodium succinate (SOLU-MEDROL) 125 mg/2 mL injection 125 mg (125 mg Intravenous Given 01/11/18 1014)    [name] is 62 y.o. female presenting with cough and shortness of breath, with Levaquin.  Patient with chronic COPD, has required more oxygen than normal, no significant wheezing on my exam.  Chest x-ray is without infiltrate, blood work, EKG including troponin negative.  Patient with some improvement after DuoNeb.  Will start on a course of prednisone.  Patient is ambulated and maintain her sats with home O2.  Will write her for prednisone burst, she will follow closely with pulmonologist, extensive discussion of return precautions and patient verbalized understanding and teach back technique.  This is a shared visit with the attending physician who personally evaluated the patient and agrees with the care plan.   Evaluation does not show pathology that would require ongoing emergent intervention or inpatient treatment. Pt is hemodynamically  stable and mentating appropriately. Discussed findings and plan with patient/guardian, who agrees with care plan. All questions answered. Return precautions discussed and outpatient follow up given.      Final Clinical Impressions(s) / ED Diagnoses   Final diagnoses:  COPD exacerbation [facility])    ED Discharge Orders        Ordered    predniSONE (DELTASONE) 50 MG tablet     01/11/18 1240       Woodrow Drab, Charna Elizabeth 01/11/18 1618    Hayden Rasmussen, MD 01/12/18 1356

## 2018-01-11 NOTE — ED Triage Notes (Signed)
Pt. Stated, Ive had some SOB and wheezing with cough all week, it just got worse.

## 2018-01-11 NOTE — ED Triage Notes (Signed)
Pt. On 1.5 L Oxygen

## 2018-01-11 NOTE — ED Notes (Addendum)
Ambulated pt in hallway on 2lt [place] o2 sat. 96-98% HR 82. No difficulties ambulating pt. Pt states feeling short winded. Pt returns to room  Placed back on tele and o2 @ 2lt. [place]

## 2018-01-11 NOTE — Discharge Instructions (Signed)
Please use your oxygen at all times at home over the next 3 days.  Do not hesitate to return to the emergency department for any new, worsening or concerning symptoms.  Start taking the prednisone tomorrow, continue to take the Levaquin and finish up the course.

## 2018-02-12 ENCOUNTER — Other Ambulatory Visit: Payer: Self-pay | Admitting: Cardiology

## 2018-02-14 ENCOUNTER — Other Ambulatory Visit: Payer: Self-pay | Admitting: Internal Medicine

## 2018-03-02 ENCOUNTER — Ambulatory Visit: Payer: 59 | Admitting: Acute Care

## 2018-03-02 ENCOUNTER — Encounter: Payer: Self-pay | Admitting: Acute Care

## 2018-03-02 ENCOUNTER — Other Ambulatory Visit (INDEPENDENT_AMBULATORY_CARE_PROVIDER_SITE_OTHER): Payer: 59

## 2018-03-02 ENCOUNTER — Telehealth: Payer: Self-pay | Admitting: Acute Care

## 2018-03-02 VITALS — BP 118/68 | HR 62 | Ht 61.0 in | Wt 176.0 lb

## 2018-03-02 DIAGNOSIS — J449 Chronic obstructive pulmonary disease, unspecified: Secondary | ICD-10-CM

## 2018-03-02 DIAGNOSIS — J9601 Acute respiratory failure with hypoxia: Secondary | ICD-10-CM

## 2018-03-02 DIAGNOSIS — J4489 Other specified chronic obstructive pulmonary disease: Secondary | ICD-10-CM

## 2018-03-02 LAB — CBC WITH DIFFERENTIAL/PLATELET
BASOS PCT: 0.8 % (ref 0.0–3.0)
Basophils Absolute: 0.1 10*3/uL (ref 0.0–0.1)
EOS ABS: 0.3 10*3/uL (ref 0.0–0.7)
EOS PCT: 3.9 % (ref 0.0–5.0)
HCT: 50.9 % — ABNORMAL HIGH (ref 36.0–46.0)
Hemoglobin: 17.1 g/dL — ABNORMAL HIGH (ref 12.0–15.0)
Lymphocytes Relative: 39.2 % (ref 12.0–46.0)
Lymphs Abs: 3.4 10*3/uL (ref 0.7–4.0)
MCHC: 33.5 g/dL (ref 30.0–36.0)
MCV: 88.1 fl (ref 78.0–100.0)
MONO ABS: 0.7 10*3/uL (ref 0.1–1.0)
Monocytes Relative: 8.3 % (ref 3.0–12.0)
Neutro Abs: 4.1 10*3/uL (ref 1.4–7.7)
Neutrophils Relative %: 47.8 % (ref 43.0–77.0)
PLATELETS: 198 10*3/uL (ref 150.0–400.0)
RBC: 5.77 Mil/uL — ABNORMAL HIGH (ref 3.87–5.11)
RDW: 14.4 % (ref 11.5–15.5)
WBC: 8.6 10*3/uL (ref 4.0–10.5)

## 2018-03-02 MED ORDER — FLUTICASONE FUROATE-VILANTEROL 200-25 MCG/INH IN AEPB: 1.0000 | INHALATION_SPRAY | Freq: Every day | RESPIRATORY_TRACT | 0 refills | Status: DC

## 2018-03-02 NOTE — Telephone Encounter (Signed)
[name] states patient is paying out of pocket for O2-can not get SImply go mini-needs order stating Simply go at 2lpm QHS. This will allow for private pay.   Called patient-she is aware of change in order and agrees. I have placed order for [facility].

## 2018-03-02 NOTE — Progress Notes (Signed)
History of Present Illness [name] is a 62 y.o. female current some day smoker  with chronic COPD. She is seen by Dr. Elsworth Soho.   03/02/2018  Pt presents for follow up. She was seen last in the office 10.2018. She was last seen in the ED for an Exacerbation 12/2017. She was still smoking at this time 3-4 cigarettes a day at the time of the ED visit.She states she has quit smoking since March 2019 ED visit. She states she is compliant with her Duonebs and her Anoro. She states she rarely uses her nebulizer treatments. She states she has had fatigue. She  Has continued dyspnea with exertion. She has had a recent ED visit for an exacerbation. She was treated in the ED with the same nebs she has at home ( Duobnebs) and IV solumedrol. She was discharged with a prednisone taper. She is asking for chronic prednisone every day, as she breathes well on the prednisone. I explained that we do not prescribe prednisone daily except with end stage disease, which she does not have.She denies any current chest pain, orthopnea of hemoptysis. No fever. Secretions are at her baseline.  No discolored secretions. We discussed the need to be aware of the initial signs of flare, and she needs to be proactive in coming to the office before she gets bad enough for ED visit. She feels she is at her baseline. She is compliant with her nocturnal oxygen. She is requesting an inogen POC. I explained that she is only qualified for nocturnal oxygen. She understands that insurance will not pay for this. I explained that she has to have a POC that can provide continuous oxygen for nocturnal use. Inogen does not provide continuous flow oxygen. I explained she would need to get a Simply Go, as it provides continuous oxygen. She understands this would be an out of pocket cost to her as she does not require day time oxygen.m We walked her today, and she did not qualify.   Test Results:  CBC Latest Ref Rng & Units 03/02/2018 01/11/2018  09/03/2017  WBC 4.0 - 10.5 K/uL 8.6 8.6 9.4  Hemoglobin 12.0 - 15.0 g/dL 17.1(H) 16.3(H) 16.8(H)  Hematocrit 36.0 - 46.0 % 50.9(H) 50.2(H) 52.1(H)  Platelets 150.0 - 400.0 K/uL 198.0 156 186.0    BMP Latest Ref Rng & Units 01/11/2018 09/03/2017 03/18/2017  Glucose 65 - 99 mg/dL 145(H) 116(H) 120(H)  BUN 6 - 20 mg/dL 8 13 <5(L)  Creatinine 0.44 - 1.00 mg/dL 0.50 0.58 0.51  Sodium 135 - 145 mmol/L 141 140 139  Potassium 3.5 - 5.1 mmol/L 4.2 3.9 3.9  Chloride 101 - 111 mmol/L 101 98 100(L)  CO2 22 - 32 mmol/L 30 35(H) 29  Calcium 8.9 - 10.3 mg/dL 9.0 9.5 9.2    BNP    Component Value Date/Time   BNP 70.0 01/11/2018 0821    ProBNP    Component Value Date/Time   PROBNP 485.2 (H) 02/01/2014 1300    PFT No results found for: FEV1PRE, FEV1POST, FVCPRE, FVCPOST, TLC, DLCOUNC, PREFEV1FVCRT, PSTFEV1FVCRT  No results found.   Past medical hx Past Medical History:  Diagnosis Date  . Bronchitis   . CAD (coronary artery disease) 09/03/2013  . CHF (congestive heart failure) ([place])   . COPD (chronic obstructive pulmonary disease) ([place])   . Coronary artery disease   . Headache   . Heart disease   . Hyperlipidemia   . Hypertension   . Impaired glucose tolerance 08/20/2014  .  Sinusitis      Social History   Tobacco Use  . Smoking status: Former Smoker    Packs/day: 0.50    Years: 40.00    Pack years: 20.00    Types: Cigarettes    Last attempt to quit: 01/19/2018    Years since quitting: 0.1  . Smokeless tobacco: Never Used  Substance Use Topics  . Alcohol use: Yes    Alcohol/week: 3.0 - 3.6 oz    Types: 5 - 6 Glasses of wine per week  . Drug use: Yes    Types: Marijuana    Comment: as a teenager    [name] reports that she quit smoking about 6 weeks ago. Her smoking use included cigarettes. She has a 20.00 pack-year smoking history. She has never used smokeless tobacco. She reports that she drinks about 3.0 - 3.6 oz of alcohol per week. She reports that she has  current or past drug history. Drug: Marijuana.  Tobacco Cessation: Current Some day smoker  Past surgical hx, Family hx, Social hx all reviewed.  Current Outpatient Medications on File Prior to Visit  Medication Sig  . acetaminophen (TYLENOL) 500 MG tablet Take 1,000 mg by mouth at bedtime as needed for headache.  . albuterol (PROVENTIL HFA) 108 (90 Base) MCG/ACT inhaler INHALE 1-2 PUFFS INTO THE LUNGS EVERY 6 (SIX) HOURS AS NEEDED FOR WHEEZING OR SHORTNESS OF BREATH.  Marland Kitchen amLODipine (NORVASC) 5 MG tablet TAKE 1 TABLET BY MOUTH EVERY DAY  . aspirin EC 81 MG tablet Take 1 tablet (81 mg total) by mouth daily.  . carvedilol (COREG) 12.5 MG tablet TAKE 1 TABLET (12.5 MG TOTAL) BY MOUTH 2 (TWO) TIMES DAILY.  . cholecalciferol (VITAMIN D) 1000 units tablet Take 1,000 Units by mouth daily.  . fluticasone (FLONASE) 50 MCG/ACT nasal spray PLACE 2 SPRAYS INTO BOTH NOSTRILS DAILY.  . furosemide (LASIX) 40 MG tablet TAKE 1 TABLET BY MOUTH TWICE A DAY  . ipratropium-albuterol (DUONEB) 0.5-2.5 (3) MG/3ML SOLN Take 3 mLs by nebulization every 6 (six) hours as needed.  . isosorbide mononitrate (IMDUR) 60 MG 24 hr tablet TAKE 1 & 1/2 TABLET BY MOUTH DAILY  . KLOR-CON M10 10 MEQ tablet TAKE 1 TABLET BY MOUTH EVERY DAY  . levofloxacin (LEVAQUIN) 500 MG tablet Take 1 tablet (500 mg total) by mouth daily.  Marland Kitchen omega-3 acid ethyl esters (LOVAZA) 1 g capsule Take 2 g by mouth daily.  . predniSONE (DELTASONE) 50 MG tablet Take 1 tablet daily with breakfast  . rosuvastatin (CRESTOR) 20 MG tablet Take 1 tablet (20 mg total) by mouth daily.  Marland Kitchen umeclidinium-vilanterol (ANORO ELLIPTA) 62.5-25 MCG/INH AEPB TAKE 1 PUFF BY MOUTH EVERY DAY  . vitamin B-12 (CYANOCOBALAMIN) 1000 MCG tablet Take 1,000 mcg by mouth daily.  . vitamin C (ASCORBIC ACID) 500 MG tablet Take 1,000 mg by mouth daily.   No current facility-administered medications on file prior to visit.      Allergies  Allergen Reactions  . Augmentin  [Amoxicillin-Pot Clavulanate] Nausea And Vomiting    Review Of Systems:  Constitutional:   No  weight loss, night sweats,  Fevers, chills, +fatigue, or  lassitude.  HEENT:   No headaches,  Difficulty swallowing,  Tooth/dental problems, or  Sore throat,                No sneezing, itching, ear ache, nasal congestion, post nasal drip,   CV:  No chest pain,  Orthopnea, PND, swelling in lower extremities, anasarca, dizziness, palpitations, syncope.  GI  No heartburn, indigestion, abdominal pain, nausea, vomiting, diarrhea, change in bowel habits, loss of appetite, bloody stools.   Resp: + shortness of breath with exertion less at rest.  No excess mucus, no productive cough,  No non-productive cough,  No coughing up of blood.  No change in color of mucus.  Occasional  wheezing.  No chest wall deformity  Skin: no rash or lesions.  GU: no dysuria, change in color of urine, no urgency or frequency.  No flank pain, no hematuria   MS:  No joint pain or swelling.  No decreased range of motion.  No back pain.  Psych:  No change in mood or affect. No depression or anxiety.  No memory loss.   Vital Signs BP 118/68 (BP Location: Left Arm, Cuff Size: Normal)   Pulse 62   Ht 5\' 1"  (1.549 m)   Wt 176 lb (79.8 kg)   SpO2 97%   BMI 33.25 kg/m    Physical Exam:  General- No distress,  A&Ox3, pleasant ENT: No sinus tenderness, TM clear, pale nasal mucosa, no oral exudate,no post nasal drip, no LAN Cardiac: S1, S2, regular rate and rhythm, no murmur Chest: No wheeze/ rales/ dullness; no accessory muscle use, no nasal flaring, no sternal retractions Abd.: Soft Non-tender, ND, BS +, Obese Ext: No clubbing cyanosis, edema Neuro:  normal strength Skin: No rashes, warm and dry Psych: normal mood and behavior   Assessment/Plan  COPD (chronic obstructive pulmonary disease) with chronic bronchitis (HCC) Stable at present Last exacerbation 12/2017 Still smoking at intervals Plan: We will do a  trial of Breo 1 puff once daily. Rinse mouth after use. If you think this medication works well for you, please call the office and we will send in a prescription. We will schedule you for PFT's CBC today Follow up with Dr. Elsworth Soho in 2 months  Note your daily symptoms > remember "red flags" for COPD:  Increase in cough, increase in sputum production, increase in shortness of breath or activity  Intolerance. If you notice these symptoms, please call to be seen.  .     Acute respiratory failure with hypoxia (HCC) Nocturnal desaturations: Compliant with oxygen 2 L at night Requesting Inogen portable oxygen even though she does not meet criteria for daytime oxygen Plan: We will send in a prescription for an Simply Go POC for personal purchase . ( As this is not covered by insurance as you do not qualify for daytime oxygen use). This must be used as continuous flow oxygen, not pulsed oxygen. Pulsed oxygen does not treat nocturanal needs. Continue wearing nocturnal oxygen as you have been doing. We will schedule you for PFT's to evaluate the severity of your disease CBC today Follow up with Dr. Elsworth Soho in 2 months  Note your daily symptoms > remember "red flags" for COPD:  Increase in cough, increase in sputum production, increase in shortness of breath or activity  Intolerance. If you notice these symptoms, please call to be seen.  Magdalen Spatz, NP 03/02/2018  3:51 PM

## 2018-03-02 NOTE — Assessment & Plan Note (Signed)
Stable at present Last exacerbation 12/2017 Still smoking at intervals Plan: We will do a trial of Breo 1 puff once daily. Rinse mouth after use. If you think this medication works well for you, please call the office and we will send in a prescription. We will schedule you for PFT's CBC today Follow up with Dr. Elsworth Soho in 2 months  Note your daily symptoms > remember "red flags" for COPD:  Increase in cough, increase in sputum production, increase in shortness of breath or activity  Intolerance. If you notice these symptoms, please call to be seen.  Marland Kitchen

## 2018-03-02 NOTE — Patient Instructions (Addendum)
It is nice to see you today. We will do a trial of Breo 1 puff once daily. Rinse mouth after use. If you think this medication works well for you, please call the office and we will send in a prescription. We will send in a prescription for an Simply Go POC for personal purchase . ( As this is not covered by insurance as you do not qualify for daytime oxygen use). This must be used as continuous flow oxygen, not pulsed oxygen. Pulsed oxygen does not treat nocturanal needs. Continue wearing nocturnal oxygen as you have been doing. We will schedule you for PFT's CBC today Follow up with Dr. Elsworth Soho in 2 months  Note your daily symptoms > remember "red flags" for COPD:  Increase in cough, increase in sputum production, increase in shortness of breath or activity  Intolerance. If you notice these symptoms, please call to be seen.  Marland Kitchen

## 2018-03-02 NOTE — Assessment & Plan Note (Signed)
Nocturnal desaturations: Compliant with oxygen 2 L at night Requesting [employer] portable oxygen even though she does not meet criteria for daytime oxygen Plan: We will send in a prescription for an [employer] POC for personal purchase . ( As this is not covered by insurance as you do not qualify for daytime oxygen use). This must be used as continuous flow oxygen, not pulsed oxygen. Pulsed oxygen does not treat nocturanal needs. Continue wearing nocturnal oxygen as you have been doing. We will schedule you for PFT's to evaluate the severity of your disease CBC today Follow up with Dr. Elsworth Soho in 2 months  Note your daily symptoms > remember "red flags" for COPD:  Increase in cough, increase in sputum production, increase in shortness of breath or activity  Intolerance. If you notice these symptoms, please call to be seen.  Marland Kitchen

## 2018-03-02 NOTE — Progress Notes (Signed)
Reo

## 2018-03-04 NOTE — Telephone Encounter (Signed)
error 

## 2018-03-06 ENCOUNTER — Encounter: Payer: Self-pay | Admitting: Acute Care

## 2018-03-06 NOTE — Telephone Encounter (Signed)
SG please see below message from the patient and please advise. Thank you!  ----- Message -----  From: [name]  Sent: 5/17/20194:58 PM EDT  To: Magdalen Spatz, NP Subject: Non-Urgent Medical Question  LOOKING @ MY TEST RESULTS, THE HEMOGLOBIN, HCT AND RBC ARE OUT OF RANGE .CAN YOU HELP ME UNDERSTAND WHAT THAT MEAN.

## 2018-03-30 ENCOUNTER — Other Ambulatory Visit: Payer: Self-pay | Admitting: Internal Medicine

## 2018-03-31 ENCOUNTER — Telehealth: Payer: Self-pay | Admitting: Pulmonary Disease

## 2018-03-31 NOTE — Telephone Encounter (Signed)
lmtcb for pt on cell #.

## 2018-03-31 NOTE — Telephone Encounter (Signed)
This is not normally a refillable medication.  Please consider ROV for any persistent or worsening symptoms she thinks may be infection related.

## 2018-03-31 NOTE — Telephone Encounter (Signed)
Pt is calling back [phone]

## 2018-03-31 NOTE — Telephone Encounter (Signed)
Patient is calling back and wanted to let us know she needs this RX sent to pharmacy today.  She states she is leaving to go out of town in the morning.  She states to please call her on her cell phone at,  [phone].

## 2018-03-31 NOTE — Telephone Encounter (Signed)
Called and spoke to pt. Pt states she needs an rx for [facility] sent to preferred pharmacy. Pt was given sample at last OV. Call was disconnected before I could see what strength sample was given to pt, pt is COPD.   LMTCB for pt to see what strength pt has of Breo. Per last OV with SG, strength of Breo not specified.

## 2018-04-01 MED ORDER — FLUTICASONE FUROATE-VILANTEROL 200-25 MCG/INH IN AEPB: 1.0000 | INHALATION_SPRAY | Freq: Every day | RESPIRATORY_TRACT | 3 refills | Status: DC

## 2018-04-01 NOTE — Telephone Encounter (Signed)
Left message for Patient to call back with Breo strength and preferred pharmacy to send prescription to.

## 2018-04-01 NOTE — Telephone Encounter (Signed)
Patient returned call. She is on Breo 200,1 puff daily.  Per last OV with SG-  It is nice to see you today. We will do a trial of Breo 1 puff once daily. Rinse mouth after use. If you think this medication works well for you, please call the office and we will send in a prescription.  Prescription sent to preferred pharmacy [facility].  Nothing further needed.

## 2018-04-08 ENCOUNTER — Telehealth: Payer: Self-pay | Admitting: Pulmonary Disease

## 2018-04-08 MED ORDER — PREDNISONE 10 MG PO TABS: ORAL_TABLET | ORAL | 0 refills | Status: DC

## 2018-04-08 MED ORDER — AZITHROMYCIN 250 MG PO TABS: ORAL_TABLET | ORAL | 0 refills | Status: DC

## 2018-04-08 NOTE — Telephone Encounter (Signed)
Pt is aware of below message and voiced her understanding. Rx for prednisone and zpak has been sent to preferred pharmacy. Nothing further is needed.

## 2018-04-08 NOTE — Telephone Encounter (Signed)
Prednisone 10 mg tabs  Take 2 tabs daily with food x 5ds, then 1 tab daily with food x 5ds then STOP  Z-Pak to take only if she has yellow/green sputum Albuterol to use as needed for wheezing

## 2018-04-08 NOTE — Telephone Encounter (Signed)
Spoke with pt, c/o increased wheezing, prod cough with white/clear mucus since last night.  Denies sinus congestion, pnd, fever, chills.  Pt has only taken 1 cough drop to help with s/s.  I reminded pt that her Albuterol is for prn use of wheezing, sob.  Pt is requesting additional recs.    [facility] [address]   RA please advise on additional recs.  Thanks.

## 2018-04-10 ENCOUNTER — Other Ambulatory Visit: Payer: Self-pay | Admitting: Internal Medicine

## 2018-04-13 ENCOUNTER — Encounter: Payer: Self-pay | Admitting: Cardiology

## 2018-04-13 ENCOUNTER — Ambulatory Visit: Payer: 59 | Admitting: Cardiology

## 2018-04-13 VITALS — BP 124/72 | HR 71 | Ht 61.0 in | Wt 174.8 lb

## 2018-04-13 DIAGNOSIS — Z951 Presence of aortocoronary bypass graft: Secondary | ICD-10-CM

## 2018-04-13 DIAGNOSIS — I209 Angina pectoris, unspecified: Secondary | ICD-10-CM | POA: Diagnosis not present

## 2018-04-13 DIAGNOSIS — R002 Palpitations: Secondary | ICD-10-CM | POA: Diagnosis not present

## 2018-04-13 DIAGNOSIS — J438 Other emphysema: Secondary | ICD-10-CM | POA: Diagnosis not present

## 2018-04-13 DIAGNOSIS — I10 Essential (primary) hypertension: Secondary | ICD-10-CM

## 2018-04-13 DIAGNOSIS — Z720 Tobacco use: Secondary | ICD-10-CM | POA: Diagnosis not present

## 2018-04-13 NOTE — Progress Notes (Signed)
[facility]. [address] [place], [place]  [address] Phone: [phone] Fax:  [phone]  Date:  04/13/2018   ID:  Rhonda, Fleming 1956-10-06, MRN [id number]  PCP:  Biagio Borg, MD   History of Present Illness: [name] is a 62 y.o. female with coronary artery disease status post bypass surgery in 02/2002, LIMA to LAD, RIMA to RCA, SVG to OM with COPD, hypertension here for followup.  Last hospitalization in August 2015 was secondary to chicken bone impaction in midesophagus.  I saw her in the hospital on 07/02/13. She was having hypertensive emergency/headache. Angina was medically managed with both beta blocker and isosorbide as well as statin and aspirin. Troponin was negative. Tobacco cessation encouraged.  At prior visit, her blood pressure was seen to be quite elevated.  Pin prick discomfort periodically in her chest. Does not sound cardiac. She reported other somatic complaints, one night she did drink too much wine and felt poor through the night, did not take her medicines, adding GERD-like sensation. This resolved. She also recalled when she had her bypass surgery in 2003 that her vein grafts would likely stay open for proximal 10 years. She is concerned about this. We discussed. Her last stress test in 2014 was overall reassuring.  02/17/17  - Recent hospitalization with COPD exacerbation shortness of breath, no chest pain. BNP was normal. Echocardiogram performed showed normal ejection fraction with grade 2 diastolic dysfunction and mild pulmonary hypertension secondary. Doing better. Still continuing to smoke.  10/02/17 -She was very concerned about her recent chest x-ray that showed mild cardiomegaly, reported as stable from prior x-ray.  I personally reviewed the x-rays and her cardiac contours do appear to be slightly enlarged but there are several reasons why this can occur including changes in focal length.  I went back and looked at her  echocardiogram measurements and they were stable, no signs of enlargement.  She was recently treated for bronchitis.  This was the reason for the x-ray.  Overall no chest pain.  Reassurance.  She was worried that she was going to have a heart attack she stated.  04/13/2018- hospitalized March 2019 with COPD exacerbation.  Has seen Eric Form with pulmonary in follow-up.  Has not had any anginal symptoms no syncope bleeding orthopnea PND.  Very anxious about her heart.  Once more testing.  Try to reassure her.  She is having some palpitations at night.  Getting a Holter monitor.  Once again spent the majority of her visit talking about smoking cessation.  She understands that this is increasing her risk dramatically for future heart attack stroke.  Wt Readings from Last 3 Encounters:  04/13/18 174 lb 12.8 oz (79.3 kg)  03/02/18 176 lb (79.8 kg)  01/11/18 177 lb (80.3 kg)     Past Medical History:  Diagnosis Date  . Bronchitis   . CAD (coronary artery disease) 09/03/2013  . CHF (congestive heart failure) ([place])   . COPD (chronic obstructive pulmonary disease) ([place])   . Coronary artery disease   . Headache   . Heart disease   . Hyperlipidemia   . Hypertension   . Impaired glucose tolerance 08/20/2014  . Sinusitis     Past Surgical History:  Procedure Laterality Date  . CORONARY ARTERY BYPASS GRAFT  2003   Triple bypass  . ESOPHAGOGASTRODUODENOSCOPY Left 06/13/2014   Procedure: ESOPHAGOGASTRODUODENOSCOPY (EGD);  Surgeon: Juanita Craver, MD;  Location: [facility] ENDOSCOPY;  Service: Endoscopy;  Laterality: Left;  . RIGID ESOPHAGOSCOPY N/A 06/12/2014   Procedure: RIGID ESOPHAGOSCOPY WITH FOREIGN BODY REMOVAL;  Surgeon: Ascencion Dike, MD;  Location: [facility] OR;  Service: ENT;  Laterality: N/A;    Current Outpatient Medications  Medication Sig Dispense Refill  . acetaminophen (TYLENOL) 500 MG tablet Take 1,000 mg by mouth at bedtime as needed for headache.    . albuterol (PROVENTIL HFA) 108 (90 Base)  MCG/ACT inhaler INHALE 1-2 PUFFS INTO THE LUNGS EVERY 6 (SIX) HOURS AS NEEDED FOR WHEEZING OR SHORTNESS OF BREATH. 6.7 Inhaler 5  . amLODipine (NORVASC) 5 MG tablet TAKE 1 TABLET BY MOUTH EVERY DAY 30 tablet 7  . aspirin EC 81 MG tablet Take 1 tablet (81 mg total) by mouth daily. 30 tablet 0  . carvedilol (COREG) 12.5 MG tablet TAKE 1 TABLET (12.5 MG TOTAL) BY MOUTH 2 (TWO) TIMES DAILY. 60 tablet 7  . cholecalciferol (VITAMIN D) 1000 units tablet Take 1,000 Units by mouth daily.    . fluticasone (FLONASE) 50 MCG/ACT nasal spray PLACE 2 SPRAYS INTO BOTH NOSTRILS DAILY. 16 g 3  . fluticasone furoate-vilanterol (BREO ELLIPTA) 200-25 MCG/INH AEPB Inhale 1 puff into the lungs daily. 60 each 3  . furosemide (LASIX) 40 MG tablet Take 40 mg by mouth daily.    Marland Kitchen ipratropium-albuterol (DUONEB) 0.5-2.5 (3) MG/3ML SOLN TAKE 3 MLS BY NEBULIZATION EVERY 6 (SIX) HOURS AS NEEDED. 360 mL 5  . isosorbide mononitrate (IMDUR) 60 MG 24 hr tablet TAKE 1 & 1/2 TABLET BY MOUTH DAILY 45 tablet 7  . KLOR-CON M10 10 MEQ tablet TAKE 1 TABLET BY MOUTH EVERY DAY 30 tablet 3  . predniSONE (DELTASONE) 10 MG tablet 2 tabs x 5d with food, 1 tab x5d with food then stop 15 tablet 0  . vitamin B-12 (CYANOCOBALAMIN) 1000 MCG tablet Take 1,000 mcg by mouth daily.    . vitamin C (ASCORBIC ACID) 500 MG tablet Take 1,000 mg by mouth daily.     No current facility-administered medications for this visit.     Allergies:    Allergies  Allergen Reactions  . Augmentin [Amoxicillin-Pot Clavulanate] Nausea And Vomiting    Social History:  The patient  reports that she quit smoking about 2 months ago. Her smoking use included cigarettes. She has a 20.00 pack-year smoking history. She has never used smokeless tobacco. She reports that she drinks about 3.0 - 3.6 oz of alcohol per week. She reports that she has current or past drug history. Drug: Marijuana.   Family History  Problem Relation Age of Onset  . Lung cancer Paternal Uncle   .  Heart disease Mother   . Brain cancer Mother   . Cancer Maternal Grandmother        colon  . Stroke Other   . Cancer Unknown        x 4 aunts  . Lung cancer Maternal Aunt   . Suicidality Brother     ROS: All other ROS negative unless specified in HPI  PHYSICAL EXAM: VS:  BP 124/72   Pulse 71   Ht 5\' 1"  (1.549 m)   Wt 174 lb 12.8 oz (79.3 kg)   SpO2 (!) 88%   BMI 33.03 kg/m  GEN: Well nourished, well developed, in no acute distress  HEENT: normal  Neck: no JVD, carotid bruits, or masses Cardiac: RRR; no murmurs, rubs, or gallops,no edema  Respiratory: Wheezes bilaterally, normal work of breathing GI: soft, nontender, nondistended, + BS MS: no deformity or atrophy  Skin: warm and dry, no rash Neuro:  Alert and Oriented x 3, Strength and sensation are intact Psych: euthymic mood, full affect     EKG:    EKG was ordered today. 02/16/16-sinus rhythm, 61, nonspecific T-wave changes personally viewed-2 other abnormalities. Sinus bradycardia rate 56, occasional PAC, nonspecific ST-T wave changes   ECHO: 02/09/17: - Left ventricle: The cavity size was normal. Systolic function was   normal. The estimated ejection fraction was in the range of 60%   to 65%. Wall motion was normal; there were no regional wall   motion abnormalities. Features are consistent with a pseudonormal   left ventricular filling pattern, with concomitant abnormal   relaxation and increased filling pressure (grade 2 diastolic   dysfunction). Doppler parameters are consistent with high   ventricular filling pressure. - Aortic valve: Trileaflet; normal thickness, mildly calcified   leaflets. - Mitral valve: There was trivial regurgitation. - Pulmonary arteries: PA peak pressure: 32 mm Hg (S). No cardiomegaly.   ASSESSMENT AND PLAN:  Coronary artery disease  -Prior catheterization reviewed.  No symptoms currently, no angina.  -Concerned about cardiomegaly.  Reassurance has been given.  In April we will  consider repeating echocardiogram.  I looked at echocardiogram measurements and they were reassuring.  Status post CABG  - 2003, stable  - mild cardiomegaly noted on CXR. Personally viewed. ECHO with no significant increase in heart size. Reassuring.  Once again discussed.  Grade 2 diastolic dysfunction  - Goes hand-in-hand with her prior CAD and smoking history as well as age.  - Continue with pressure medications, Lasix.  - BNP and troponin were normal during her recent hospitalization for COPD exacerbation.  -Continue to maintain.  Medications reviewed.  No changes.  Palpitations -Worse at night.  She is concerned about these.  I do not hear any ectopy on exam today.  She is insistent upon having further testing on her heart.  It is not unreasonable to check a 24-hour Holter monitor.  Angina  -Seems to be under good control with both isosorbide as well as carvedilol.  Hyperlipidemia  -LDL in November 2018 157.  Triglycerides 302.  Promoted Crestor use 20 mg daily.  When taking, her LDL is 68.  COPD  - Recent exacerbation in 01/11/2018 as well as late April 2018.  Improved but ongoing issue.  Following pulmonary.  Oxygen saturation 88%.  Currently on prednisone.  Tobacco use  - Once again strongly encourage tobacco cessation.  Discussed again today.  Lengthy discussion.  Bone impaction of esophagus -Dr. Cyndia Bent, Dr. Benjamine Mola. Chicken bone and cartilage removed from midportion of the esophagus. 06/12/14.  .  79-month follow-up with Mickel Baas, 1 year me  Signed, Candee Furbish, MD [facility]  04/13/2018 9:23 AM

## 2018-04-13 NOTE — Patient Instructions (Signed)
Medication Instructions:  The current medical regimen is effective;  continue present plan and medications.  Testing/Procedures: Your physician has recommended that you wear a holter monitor. Holter monitors are medical devices that record the heart's electrical activity. Doctors most often use these monitors to diagnose arrhythmias. Arrhythmias are problems with the speed or rhythm of the heartbeat. The monitor is a small, portable device. You can wear one while you do your normal daily activities. This is usually used to diagnose what is causing palpitations/syncope (passing out).  Follow-Up: Follow up in 6 months with Cecilie Kicks, NP.  You will receive a letter in the mail 2 months before you are due.  Please call us when you receive this letter to schedule your follow up appointment.  Follow up in 1 year with Dr. Marlou Porch.  You will receive a letter in the mail 2 months before you are due.  Please call us when you receive this letter to schedule your follow up appointment.  If you need a refill on your cardiac medications before your next appointment, please call your pharmacy.  Thank you for choosing [facility]!!

## 2018-04-14 ENCOUNTER — Telehealth: Payer: Self-pay | Admitting: Pulmonary Disease

## 2018-04-14 NOTE — Telephone Encounter (Signed)
Spoke with patient. She has called in on 04/08/18 with similar symptoms. She was prescribed a prednisone taper and zpak. She is not feeling any better today. Since has a non-productive cough, increased SOB and fatigue. She is also using 2L of O2.   [facility] [address]   RA, please advise. Thanks!

## 2018-04-14 NOTE — Progress Notes (Signed)
@Patient  ID: [name], female    DOB: Feb 25, 1956, 62 y.o.   MRN: [id number]  Chief Complaint  Patient presents with  . Acute Visit    Increased SOB, wheezing, productive cough.2 more doses of prednisone left. Chest feels tight and states her phlegm remains thick and green.      Referring provider: Biagio Borg, MD  HPI: [name] is a 62 y.o. female current some day smoker  with chronic COPD. She is seen by Dr. Elsworth Soho.  Recent [facility] Encounters:   03/02/18 - OV - SG  COPD, last exacerbation 12/2017, current every day smoker Plan: Trial of Breo, rinse mouth after use, we will schedule you for PFTs, follow-up with Dr. Elsworth Soho in 2 months, continue 2 L of oxygen at night  04/08/18 - telephone - Elsworth Soho - zpak / pred taper   Tests:  03/07/2017-spirometry- severe airway obstruction FEV1 42  Imaging:   01/11/18-chest x-ray-improved cardiomegaly and pulmonary vascular congestion, stable mild changes of COPD 12/09/2016-CT Angio- no radiographic evidence of pulmonary embolism   Cardiac:  02/09/2017-echocardiogram LV ejection fraction 60 to 02%, grade 2 diastolic dysfunction   Labs:   Micro:   Chart Review:     04/15/18 Acute Pleasant 62 year old patient seen in office today.  Patient reporting she did stop smoking in April/2019.  Patient reporting adherence to [facility] as well as using duo nebs twice a day.  Patient called our office on 04/08/2018 reporting symptoms of shortness of breath, productive cough, mucus changes to green.  Patient was treated with Z-Pak as well as small prednisone taper.  Patient reporting that she has had some improvement since then but does not feel like she is really gotten over it.  Patient also having some concerns that she feels she may have pneumonia.  Feels like when she had pneumonia in the past.  Patient reporting that she has times where she feels hot but does not think that she has a temperature.  Patient reports that a couple of days  ago she went to cardiology to be seen in her oxygen saturation was 88%.  Patient's oxygen saturation today is 93% on room air. Patient says that wheezing as well as cough has become more productive - still thick green to clear mucous.    Allergies  Allergen Reactions  . Augmentin [Amoxicillin-Pot Clavulanate] Nausea And Vomiting    Immunization History  Administered Date(s) Administered  . Influenza Split 07/08/2013  . Influenza,inj,Quad PF,6+ Mos 07/31/2015, 09/03/2017  . Pneumococcal Polysaccharide-23 02/02/2014    Past Medical History:  Diagnosis Date  . Bronchitis   . CAD (coronary artery disease) 09/03/2013  . CHF (congestive heart failure) ([place])   . COPD (chronic obstructive pulmonary disease) ([place])   . Coronary artery disease   . Headache   . Heart disease   . Hyperlipidemia   . Hypertension   . Impaired glucose tolerance 08/20/2014  . Sinusitis     Tobacco History: Social History   Tobacco Use  Smoking Status Former Smoker  . Packs/day: 0.50  . Years: 40.00  . Pack years: 20.00  . Types: Cigarettes  . Last attempt to quit: 01/19/2018  . Years since quitting: 0.2  Smokeless Tobacco Never Used   Counseling given: Not Answered   Outpatient Encounter Medications as of 04/15/2018  Medication Sig  . acetaminophen (TYLENOL) 500 MG tablet Take 1,000 mg by mouth at bedtime as needed for headache.  . albuterol (PROVENTIL HFA) 108 (90 Base) MCG/ACT inhaler INHALE  1-2 PUFFS INTO THE LUNGS EVERY 6 (SIX) HOURS AS NEEDED FOR WHEEZING OR SHORTNESS OF BREATH.  Marland Kitchen amLODipine (NORVASC) 5 MG tablet TAKE 1 TABLET BY MOUTH EVERY DAY  . aspirin EC 81 MG tablet Take 1 tablet (81 mg total) by mouth daily.  . carvedilol (COREG) 12.5 MG tablet TAKE 1 TABLET (12.5 MG TOTAL) BY MOUTH 2 (TWO) TIMES DAILY.  . cholecalciferol (VITAMIN D) 1000 units tablet Take 1,000 Units by mouth daily.  . fluticasone (FLONASE) 50 MCG/ACT nasal spray PLACE 2 SPRAYS INTO BOTH NOSTRILS DAILY.  . fluticasone  furoate-vilanterol (BREO ELLIPTA) 200-25 MCG/INH AEPB Inhale 1 puff into the lungs daily.  . furosemide (LASIX) 40 MG tablet Take 40 mg by mouth daily.  Marland Kitchen ipratropium-albuterol (DUONEB) 0.5-2.5 (3) MG/3ML SOLN TAKE 3 MLS BY NEBULIZATION EVERY 6 (SIX) HOURS AS NEEDED.  Marland Kitchen isosorbide mononitrate (IMDUR) 60 MG 24 hr tablet TAKE 1 & 1/2 TABLET BY MOUTH DAILY  . KLOR-CON M10 10 MEQ tablet TAKE 1 TABLET BY MOUTH EVERY DAY  . vitamin B-12 (CYANOCOBALAMIN) 1000 MCG tablet Take 1,000 mcg by mouth daily.  . vitamin C (ASCORBIC ACID) 500 MG tablet Take 1,000 mg by mouth daily.  . [DISCONTINUED] predniSONE (DELTASONE) 10 MG tablet 2 tabs x 5d with food, 1 tab x5d with food then stop  . predniSONE (DELTASONE) 10 MG tablet 4 tabs for 2 days, then 3 tabs for 2 days, 2 tabs for 2 days, then 1 tab for 2 days, then stop   No facility-administered encounter medications on file as of 04/15/2018.      Review of Systems  Constitutional: +fatigued  No  weight loss, night sweats,  fevers, chills HEENT:   No headaches,  Difficulty swallowing,  Tooth/dental problems, or  Sore throat, No sneezing, itching, ear ache, nasal congestion, post nasal drip  CV: No chest pain,  orthopnea, PND, swelling in lower extremities, anasarca, dizziness, palpitations, syncope  GI: No heartburn, indigestion, abdominal pain, nausea, vomiting, diarrhea, change in bowel habits, loss of appetite, bloody stools Resp: +sob with exertion, productive cough with green/clear mucous, wheezing,  No shortness of breath  at rest.    No coughing up of blood.  No chest wall deformity Skin: no rash, lesions, no skin changes. GU: no dysuria, change in color of urine, no urgency or frequency.  No flank pain, no hematuria  MS:  No joint pain or swelling.  No decreased range of motion.  No back pain. Psych:  No change in mood or affect. No depression or anxiety.  No memory loss.   Physical Exam  BP 124/70   Pulse 64   Ht 5\' 1"  (1.549 m)   Wt 173 lb 3.2  oz (78.6 kg)   SpO2 93%   BMI 32.73 kg/m   Wt Readings from Last 3 Encounters:  04/15/18 173 lb 3.2 oz (78.6 kg)  04/13/18 174 lb 12.8 oz (79.3 kg)  03/02/18 176 lb (79.8 kg)     GEN: A/Ox3; pleasant , NAD, well nourished, on RA   HEENT:  [place]/AT,  EACs-clear, TMs-wnl, NOSE-clear, THROAT-clear, no lesions, no postnasal drip or exudate noted.   NECK:  Supple w/ fair ROM; no JVD; normal carotid impulses w/o bruits; no lymphadenopathy.    RESP:  +expiratory wheezes on exam, diminished air movement on exam   no accessory muscle use, no dullness to percussion  CARD:  RRR, no m/r/g, no peripheral edema, pulses intact, no cyanosis or clubbing.  GI:   Soft & nt;  nml bowel sounds; no organomegaly or masses detected.   Musco: Warm bil, no deformities or joint swelling noted.   Neuro: alert, no focal deficits noted.    Skin: Warm, no lesions or rashes    Lab Results:  CBC    Component Value Date/Time   WBC 8.6 03/02/2018 1326   RBC 5.77 (H) 03/02/2018 1326   HGB 17.1 (H) 03/02/2018 1326   HGB 15.7 06/12/2014 1543   HCT 50.9 (H) 03/02/2018 1326   HCT 48.3 (H) 06/12/2014 1543   PLT 198.0 03/02/2018 1326   PLT 178 06/12/2014 1543   MCV 88.1 03/02/2018 1326   MCV 91 06/12/2014 1543   MCH 29.7 01/11/2018 0821   MCHC 33.5 03/02/2018 1326   RDW 14.4 03/02/2018 1326   RDW 14.8 (H) 06/12/2014 1543   LYMPHSABS 3.4 03/02/2018 1326   MONOABS 0.7 03/02/2018 1326   EOSABS 0.3 03/02/2018 1326   BASOSABS 0.1 03/02/2018 1326    BMET    Component Value Date/Time   NA 141 01/11/2018 0821   NA 142 06/12/2014 1543   K 4.2 01/11/2018 0821   K 3.6 06/12/2014 1543   CL 101 01/11/2018 0821   CL 105 06/12/2014 1543   CO2 30 01/11/2018 0821   CO2 29 06/12/2014 1543   GLUCOSE 145 (H) 01/11/2018 0821   GLUCOSE 113 (H) 06/12/2014 1543   BUN 8 01/11/2018 0821   BUN 12 06/12/2014 1543   CREATININE 0.50 01/11/2018 0821   CREATININE 0.62 06/12/2014 1543   CALCIUM 9.0 01/11/2018 0821    CALCIUM 8.9 06/12/2014 1543   GFRNONAA >60 01/11/2018 0821   GFRNONAA >60 06/12/2014 1543   GFRAA >60 01/11/2018 0821   GFRAA >60 06/12/2014 1543    BNP    Component Value Date/Time   BNP 70.0 01/11/2018 0821    ProBNP    Component Value Date/Time   PROBNP 485.2 (H) 02/01/2014 1300    Imaging: No results found.   Assessment & Plan:   Continued COPD exacerbation  We will do chest x-ray today as well as prednisone taper.  Patient to continue Breo inhaler, encouraged to use DuoNeb nebulizers every 6 hours as needed shortness of breath or wheezing.  Follow-up with our office in 2 months.  Reviewed symptoms to be watching for as red flags for COPD.  I do not think the patient needs any more antibiotics at this time.  Patient just finished Z-Pak 2 days ago.  Tobacco abuse Stopped smoking April/2019  Praised patient for her stopping smoking.  Encourage patient to avoid being around people you are continuing to smoke.  COPD exacerbation ([place]) Chest x-ray today Prednisone taper - 10mg  tablet  >>>4 tabs for 2 days, then 3 tabs for 2 days, 2 tabs for 2 days, then 1 tab for 2 days, then stop  Continue Breo inhaler Continue DuoNeb nebulizers  Follow-up with our office in 2 months  Note your daily symptoms > remember "red flags" for COPD:   >>>Increase in cough >>>increase in sputum production >>>increase in shortness of breath or activity  intolerance.   If you notice these symptoms, please call the office to be seen.        Lauraine Rinne, NP 04/15/2018

## 2018-04-14 NOTE — Telephone Encounter (Signed)
Spoke with pt, aware of RA's recs.  Scheduled pt to see Aaron Edelman tomorrow at 9:00.  Nothing further needed.

## 2018-04-14 NOTE — Telephone Encounter (Signed)
Needs OV with me /NP to assess

## 2018-04-15 ENCOUNTER — Ambulatory Visit (INDEPENDENT_AMBULATORY_CARE_PROVIDER_SITE_OTHER)
Admission: RE | Admit: 2018-04-15 | Discharge: 2018-04-15 | Disposition: A | Discharge status: Home / Self Care | Payer: 59 | Source: Ambulatory Visit | Attending: Pulmonary Disease | Admitting: Pulmonary Disease

## 2018-04-15 ENCOUNTER — Encounter: Payer: Self-pay | Admitting: Pulmonary Disease

## 2018-04-15 ENCOUNTER — Ambulatory Visit (INDEPENDENT_AMBULATORY_CARE_PROVIDER_SITE_OTHER): Payer: 59 | Admitting: Pulmonary Disease

## 2018-04-15 ENCOUNTER — Telehealth: Payer: Self-pay | Admitting: Pulmonary Disease

## 2018-04-15 VITALS — BP 124/70 | HR 64 | Ht 61.0 in | Wt 173.2 lb

## 2018-04-15 DIAGNOSIS — Z720 Tobacco use: Secondary | ICD-10-CM | POA: Diagnosis not present

## 2018-04-15 DIAGNOSIS — J441 Chronic obstructive pulmonary disease with (acute) exacerbation: Secondary | ICD-10-CM | POA: Diagnosis not present

## 2018-04-15 MED ORDER — PREDNISONE 10 MG PO TABS: ORAL_TABLET | ORAL | 0 refills | Status: DC

## 2018-04-15 NOTE — Telephone Encounter (Signed)
Notes recorded by Lauraine Rinne, NP on 04/15/2018 at 12:40 PM EDT Your chest x-ray results of come back. Showing no acute changes or infiltrates. No changes to your plan of care at this time. Continue with prednisone taper. Follow-up with our office if symptoms worsen or are not improving with this regimen.  Wyn Quaker, FNP  lmtcb x1 for pt.

## 2018-04-15 NOTE — Progress Notes (Signed)
Your chest x-ray results of come back.  Showing no acute changes or infiltrates.  No changes to your plan of care at this time.  Continue with prednisone taper.  Follow-up with our office if symptoms worsen or are not improving with this regimen.  Wyn Quaker, FNP

## 2018-04-15 NOTE — Assessment & Plan Note (Signed)
Chest x-ray today Prednisone taper - 10mg  tablet  >>>4 tabs for 2 days, then 3 tabs for 2 days, 2 tabs for 2 days, then 1 tab for 2 days, then stop  Continue Breo inhaler Continue DuoNeb nebulizers  Follow-up with our office in 2 months  Note your daily symptoms > remember "red flags" for COPD:   >>>Increase in cough >>>increase in sputum production >>>increase in shortness of breath or activity  intolerance.   If you notice these symptoms, please call the office to be seen.

## 2018-04-15 NOTE — Assessment & Plan Note (Signed)
Stopped smoking April/2019  Praised patient for her stopping smoking.  Encourage patient to avoid being around people you are continuing to smoke.

## 2018-04-15 NOTE — Patient Instructions (Addendum)
Chest x-ray today Prednisone taper - 10mg  tablet  >>>4 tabs for 2 days, then 3 tabs for 2 days, 2 tabs for 2 days, then 1 tab for 2 days, then stop  Continue Breo inhaler Continue DuoNeb nebulizers  Follow-up with our office in 2 months  Note your daily symptoms > remember "red flags" for COPD:   >>>Increase in cough >>>increase in sputum production >>>increase in shortness of breath or activity  intolerance.   If you notice these symptoms, please call the office to be seen.        Please contact the office if your symptoms worsen or you have concerns that you are not improving.   Thank you for choosing [facility] for your healthcare, and for allowing [place] to partner with you on your healthcare journey. I am thankful to be able to provide care to you today.   Wyn Quaker FNP-C

## 2018-04-16 NOTE — Telephone Encounter (Signed)
Called spoke with patient Discussed results/recs as stated by Aaron Edelman NP Nothing further needed; will sign off

## 2018-04-21 NOTE — Progress Notes (Signed)
Reviewed & agree with plan  

## 2018-04-27 ENCOUNTER — Ambulatory Visit (INDEPENDENT_AMBULATORY_CARE_PROVIDER_SITE_OTHER): Payer: 59

## 2018-04-27 DIAGNOSIS — R002 Palpitations: Secondary | ICD-10-CM

## 2018-05-11 ENCOUNTER — Other Ambulatory Visit: Payer: Self-pay | Admitting: Internal Medicine

## 2018-06-15 ENCOUNTER — Encounter: Payer: Self-pay | Admitting: *Deleted

## 2018-06-15 ENCOUNTER — Ambulatory Visit: Payer: 59 | Admitting: Nurse Practitioner

## 2018-06-15 ENCOUNTER — Encounter: Payer: Self-pay | Admitting: Nurse Practitioner

## 2018-06-15 VITALS — BP 132/62 | HR 64

## 2018-06-15 DIAGNOSIS — J449 Chronic obstructive pulmonary disease, unspecified: Secondary | ICD-10-CM

## 2018-06-15 DIAGNOSIS — J309 Allergic rhinitis, unspecified: Secondary | ICD-10-CM | POA: Diagnosis not present

## 2018-06-15 DIAGNOSIS — G4734 Idiopathic sleep related nonobstructive alveolar hypoventilation: Secondary | ICD-10-CM

## 2018-06-15 MED ORDER — ALBUTEROL SULFATE HFA 108 (90 BASE) MCG/ACT IN AERS: INHALATION_SPRAY | RESPIRATORY_TRACT | 5 refills | Status: DC

## 2018-06-15 NOTE — Assessment & Plan Note (Signed)
Stable at present Last exacerbation 6/19 Patient Instructions  Continue Breo Continue PRN inhaler and neb Continue O2 at night Follow up with Dr. Elsworth Soho in 3 months Please call if symptoms worsen '

## 2018-06-15 NOTE — Assessment & Plan Note (Signed)
Continue flonase 

## 2018-06-15 NOTE — Assessment & Plan Note (Signed)
Continue O2 at night.  

## 2018-06-15 NOTE — Progress Notes (Signed)
@Patient  ID: [name], female    DOB: 26-Oct-1955, 62 y.o.   MRN: [id number]  Chief Complaint  Patient presents with  . Follow-up    2 month    Referring provider: Biagio Borg, MD  HPI   62 year old female current some day smoker with chronic COPD. She is followed by Dr. Elsworth Soho. Health history includes HTN, CHF, allergic rhinitis, GERD  Tests: 03/07/2017-spirometry- severe airway obstruction FEV1 42  Imaging:   01/11/18-chest x-ray-improved cardiomegaly and pulmonary vascular congestion, stable mild changes of COPD 12/09/2016-CT Angio- no radiographic evidence of pulmonary embolism   Cardiac:  02/09/2017-echocardiogram LV ejection fraction 60 to 22%, grade 2 diastolic dysfunction  OV 0-25-42 Follow up COPD  Exacerbation Patient presents for 2 month follow up after recent COPD exacerbation. She completed azithromycin and prednisone. Reports that she has been compliant with Breo, albuterol, and duonebs. States that she has been doing well. Still using duonebs occasionally. Denies any shortness of breath today or recent fever. Needs refill today on albuterol inhaler.   Allergies  Allergen Reactions  . Augmentin [Amoxicillin-Pot Clavulanate] Nausea And Vomiting    Immunization History  Administered Date(s) Administered  . Influenza Split 07/08/2013  . Influenza,inj,Quad PF,6+ Mos 07/31/2015, 09/03/2017  . Pneumococcal Polysaccharide-23 02/02/2014    Past Medical History:  Diagnosis Date  . Bronchitis   . CAD (coronary artery disease) 09/03/2013  . CHF (congestive heart failure) ([place])   . COPD (chronic obstructive pulmonary disease) ([place])   . Coronary artery disease   . Headache   . Heart disease   . Hyperlipidemia   . Hypertension   . Impaired glucose tolerance 08/20/2014  . Sinusitis     Tobacco History: Social History   Tobacco Use  Smoking Status Former Smoker  . Packs/day: 0.50  . Years: 40.00  . Pack years: 20.00  . Types: Cigarettes  . Last  attempt to quit: 01/19/2018  . Years since quitting: 0.4  Smokeless Tobacco Never Used   Counseling given: Yes   Outpatient Encounter Medications as of 06/15/2018  Medication Sig  . acetaminophen (TYLENOL) 500 MG tablet Take 1,000 mg by mouth at bedtime as needed for headache.  . albuterol (PROVENTIL HFA) 108 (90 Base) MCG/ACT inhaler INHALE 1-2 PUFFS INTO THE LUNGS EVERY 6 (SIX) HOURS AS NEEDED FOR WHEEZING OR SHORTNESS OF BREATH.  Marland Kitchen amLODipine (NORVASC) 5 MG tablet TAKE 1 TABLET BY MOUTH EVERY DAY  . aspirin EC 81 MG tablet Take 1 tablet (81 mg total) by mouth daily.  . carvedilol (COREG) 12.5 MG tablet TAKE 1 TABLET (12.5 MG TOTAL) BY MOUTH 2 (TWO) TIMES DAILY.  . cholecalciferol (VITAMIN D) 1000 units tablet Take 1,000 Units by mouth daily.  . fluticasone (FLONASE) 50 MCG/ACT nasal spray PLACE 2 SPRAYS INTO BOTH NOSTRILS DAILY.  . fluticasone furoate-vilanterol (BREO ELLIPTA) 200-25 MCG/INH AEPB Inhale 1 puff into the lungs daily.  . furosemide (LASIX) 40 MG tablet Take 40 mg by mouth daily.  Marland Kitchen ipratropium-albuterol (DUONEB) 0.5-2.5 (3) MG/3ML SOLN TAKE 3 MLS BY NEBULIZATION EVERY 6 (SIX) HOURS AS NEEDED.  Marland Kitchen isosorbide mononitrate (IMDUR) 60 MG 24 hr tablet TAKE 1 & 1/2 TABLET BY MOUTH DAILY  . KLOR-CON M10 10 MEQ tablet TAKE 1 TABLET BY MOUTH EVERY DAY  . predniSONE (DELTASONE) 10 MG tablet 4 tabs for 2 days, then 3 tabs for 2 days, 2 tabs for 2 days, then 1 tab for 2 days, then stop  . vitamin B-12 (CYANOCOBALAMIN) 1000 MCG tablet  Take 1,000 mcg by mouth daily.  . vitamin C (ASCORBIC ACID) 500 MG tablet Take 1,000 mg by mouth daily.  . [DISCONTINUED] albuterol (PROVENTIL HFA) 108 (90 Base) MCG/ACT inhaler INHALE 1-2 PUFFS INTO THE LUNGS EVERY 6 (SIX) HOURS AS NEEDED FOR WHEEZING OR SHORTNESS OF BREATH.   No facility-administered encounter medications on file as of 06/15/2018.      Review of Systems  Review of Systems  Constitutional: Negative.   HENT: Negative.   Respiratory:  Negative for cough, shortness of breath and wheezing.   Cardiovascular: Negative.   Gastrointestinal: Negative.   Allergic/Immunologic: Negative.   Neurological: Negative.   Psychiatric/Behavioral: Negative.        Physical Exam  BP 132/62   Pulse 64   SpO2 92%   Wt Readings from Last 5 Encounters:  04/15/18 173 lb 3.2 oz (78.6 kg)  04/13/18 174 lb 12.8 oz (79.3 kg)  03/02/18 176 lb (79.8 kg)  01/11/18 177 lb (80.3 kg)  01/05/18 177 lb (80.3 kg)     Physical Exam  Constitutional: She is oriented to person, place, and time. She appears well-developed and well-nourished. No distress.  Cardiovascular: Normal rate and regular rhythm.  Pulmonary/Chest: Effort normal and breath sounds normal.  Neurological: She is alert and oriented to person, place, and time.  Psychiatric: She has a normal mood and affect.  Nursing note and vitals reviewed.    Assessment & Plan:   COPD (chronic obstructive pulmonary disease) with chronic bronchitis ([place]) Stable at present Last exacerbation 6/19 Patient Instructions  Continue Breo Continue PRN inhaler and neb Continue O2 at night Follow up with Dr. Elsworth Soho in 3 months Please call if symptoms worsen '   Allergic rhinitis Continue flonase  Nocturnal hypoxemia Continue O2 at night     Fenton Foy, NP 06/15/2018

## 2018-06-15 NOTE — Patient Instructions (Signed)
Continue Breo Continue PRN inhaler and neb Continue O2 at night Follow up with Dr. Elsworth Soho in 3 months Please call if symptoms worsen

## 2018-07-21 ENCOUNTER — Other Ambulatory Visit: Payer: Self-pay | Admitting: Acute Care

## 2018-07-30 ENCOUNTER — Emergency Department (HOSPITAL_COMMUNITY)
Admission: EM | Admit: 2018-07-30 | Discharge: 2018-07-30 | Disposition: A | Discharge status: Home / Self Care | Payer: 59 | Attending: Emergency Medicine | Admitting: Emergency Medicine

## 2018-07-30 ENCOUNTER — Other Ambulatory Visit: Payer: Self-pay

## 2018-07-30 ENCOUNTER — Encounter (HOSPITAL_COMMUNITY): Payer: Self-pay | Admitting: Emergency Medicine

## 2018-07-30 ENCOUNTER — Emergency Department (HOSPITAL_COMMUNITY): Payer: 59

## 2018-07-30 DIAGNOSIS — Z79899 Other long term (current) drug therapy: Secondary | ICD-10-CM | POA: Diagnosis not present

## 2018-07-30 DIAGNOSIS — R0602 Shortness of breath: Secondary | ICD-10-CM

## 2018-07-30 DIAGNOSIS — R079 Chest pain, unspecified: Secondary | ICD-10-CM | POA: Diagnosis not present

## 2018-07-30 DIAGNOSIS — Z7982 Long term (current) use of aspirin: Secondary | ICD-10-CM | POA: Insufficient documentation

## 2018-07-30 DIAGNOSIS — Z87891 Personal history of nicotine dependence: Secondary | ICD-10-CM | POA: Insufficient documentation

## 2018-07-30 DIAGNOSIS — G44019 Episodic cluster headache, not intractable: Secondary | ICD-10-CM

## 2018-07-30 DIAGNOSIS — I509 Heart failure, unspecified: Secondary | ICD-10-CM | POA: Diagnosis not present

## 2018-07-30 DIAGNOSIS — J449 Chronic obstructive pulmonary disease, unspecified: Secondary | ICD-10-CM | POA: Insufficient documentation

## 2018-07-30 DIAGNOSIS — Z951 Presence of aortocoronary bypass graft: Secondary | ICD-10-CM | POA: Insufficient documentation

## 2018-07-30 DIAGNOSIS — I110 Hypertensive heart disease with heart failure: Secondary | ICD-10-CM | POA: Insufficient documentation

## 2018-07-30 DIAGNOSIS — I2510 Atherosclerotic heart disease of native coronary artery without angina pectoris: Secondary | ICD-10-CM | POA: Diagnosis not present

## 2018-07-30 LAB — CBC
HCT: 53.4 % — ABNORMAL HIGH (ref 36.0–46.0)
Hemoglobin: 16.7 g/dL — ABNORMAL HIGH (ref 12.0–15.0)
MCH: 28.1 pg (ref 26.0–34.0)
MCHC: 31.3 g/dL (ref 30.0–36.0)
MCV: 89.7 fL (ref 80.0–100.0)
Platelets: 171 10*3/uL (ref 150–400)
RBC: 5.95 MIL/uL — ABNORMAL HIGH (ref 3.87–5.11)
RDW: 13.2 % (ref 11.5–15.5)
WBC: 6.6 10*3/uL (ref 4.0–10.5)
nRBC: 0 % (ref 0.0–0.2)

## 2018-07-30 LAB — BASIC METABOLIC PANEL
Anion gap: 8 (ref 5–15)
BUN: 8 mg/dL (ref 8–23)
CO2: 31 mmol/L (ref 22–32)
Calcium: 9.2 mg/dL (ref 8.9–10.3)
Chloride: 103 mmol/L (ref 98–111)
Creatinine, Ser: 0.51 mg/dL (ref 0.44–1.00)
GFR calc Af Amer: >60 mL/min (ref >60)
GFR calc non Af Amer: >60 mL/min (ref >60)
Glucose, Bld: 128 mg/dL — ABNORMAL HIGH (ref 70–99)
Potassium: 3.9 mmol/L (ref 3.5–5.1)
Sodium: 142 mmol/L (ref 135–145)

## 2018-07-30 LAB — I-STAT TROPONIN, ED: Troponin i, poc: 0 ng/mL (ref 0.00–0.08)

## 2018-07-30 NOTE — Consult Note (Signed)
Cardiology Consult    Patient ID: RHYLEI MCQUAIG MRN: [id number], DOB/AGE: [id number]   Admit date: 07/30/2018 Date of Consult: 07/30/2018  Primary Physician: Biagio Borg, MD Primary Cardiologist: Candee Furbish, MD Requesting Provider: Dr. Wilson Singer  Patient Profile    [name] is a 62 y.o. female with a history of CAD s/p CABG x3 with LIMA to LAD, RIMA to RCA, and SVG to OM in 02/2002; hypertension, hyperlipidemia, and COPD on home O2 at night with continued tobacco use, who is being seen today for the evaluation of chest pain at the request of Dr. Wilson Singer.  History of Present Illness    [name] is a 62 year old female with a history of CAD s/p CABG x3, hypertension, hyperlipidemia, COPD, and tobacco use who is followed by Dr. Marlou Porch. She was last presented to the ED on 01/11/2018 with a COPD exacerbation. She was prescribed a prednisone burst and discharged with close follow-up with Pulmonology. She last saw Dr. Marlou Porch on 04/13/2018 at which time she reported having palpitations. Patient was very anxious about her heart at that visit. A 24-hour Holter monitor was ordered and showed PVCs/PACs and brief paroxsymal atrial tachycardia of 6 beats at a rate of 130 bpm but no atrial fibrillation, pauses, or ventricular tachycardia. Dr. Marlou Porch recommended continuing Carvedilol.  Patient has been been feeling well until this morning. Around 7:45pm, patient states she developed a "bad migraine" with associated tingling in both shoulder/arms and upper extremity weakness stating she felt like she "could not make a fist." At the same time, she noted tightness/fullness in her chest and states it felt like she was choking as well as some shortness of breath. She went and lay down for a little and put on her O2 and all her symptoms completely resolved. The chest pain returned when she got up to leave for work. She put the O2 back on in the car and the pain resolved. When she got to work she developed  sharp substernal chest pain at rest that lasted for about 30 seconds. She ranked this pain as a 7/10 and described it as a "striking" feeling and a "choking feeling." She had associated palpitations, lightheadedness, and dizziness with this. The pain did not radiate anywhere and she denied any nausea, vomiting, or diaphoresis. She does report feeling very fatigued and weak all day. She called the [facility] office but was unable to get an appointment for today, so her family recommended her come to the ED to be evaluated.   Upon arrival to the ED, vital signs stable. EKG showed sinus bradycardia with rate of 56 bpm. I-stat troponin negative. Chest x-ray showed cardiomegaly with mild pulmonary vascular congestion.   Patient notes history of dyspnea on exertion due to her COPD. She also states her shortness of breath seems to be getting a little worse and she has been using her nebulizers more often the last couple of weeks. Patient states she gets fatigued with activity and then once she is fatigued she develops a chest tightness that she thinks is more related to her COPD. She also reports that she feels like she is choking if she lies flat at night and thus sleeps on several pillows. She denies any PND. She does note some swelling around her ankles at night.   Currently, patient is completely asymptomatic and denies any chest pain or shortness of breath. Patient continues to smoke and estimates she smokes about 1 pack per week.    Past  Medical History   Past Medical History:  Diagnosis Date  . Bronchitis   . CAD (coronary artery disease)    a. 02/2002: CABG x3 with LIMA to LAD, RIMA to RCA, and SVG to OM  . CHF (congestive heart failure) ([place])   . COPD (chronic obstructive pulmonary disease) ([place])   . Headache   . Heart disease   . Hyperlipidemia   . Hypertension   . Impaired glucose tolerance 08/20/2014  . Sinusitis     Past Surgical History:  Procedure Laterality Date  . CORONARY ARTERY BYPASS  GRAFT  2003   Triple bypass  . ESOPHAGOGASTRODUODENOSCOPY Left 06/13/2014   Procedure: ESOPHAGOGASTRODUODENOSCOPY (EGD);  Surgeon: Juanita Craver, MD;  Location: [facility] ENDOSCOPY;  Service: Endoscopy;  Laterality: Left;  . RIGID ESOPHAGOSCOPY N/A 06/12/2014   Procedure: RIGID ESOPHAGOSCOPY WITH FOREIGN BODY REMOVAL;  Surgeon: Ascencion Dike, MD;  Location: [facility] OR;  Service: ENT;  Laterality: N/A;     Allergies  Allergies  Allergen Reactions  . Augmentin [Amoxicillin-Pot Clavulanate] Nausea And Vomiting    Inpatient Medications      Family History    Family History  Problem Relation Age of Onset  . Lung cancer Paternal Uncle   . Heart disease Mother   . Brain cancer Mother   . Cancer Maternal Grandmother        colon  . Stroke Other   . Cancer Unknown        x 4 aunts  . Lung cancer Maternal Aunt   . Suicidality Brother    She indicated that her mother is deceased. She indicated that her father is deceased. She indicated that all of her three sisters are alive. She indicated that her brother is deceased. She indicated that her maternal grandmother is deceased. She indicated that her maternal grandfather is deceased. She indicated that her paternal grandmother is deceased. She indicated that her paternal grandfather is deceased. She indicated that the status of her maternal aunt is unknown. She indicated that the status of her paternal uncle is unknown. She indicated that the status of her other is unknown. She indicated that the status of her unknown relative is unknown.   Social History    Social History   Socioeconomic History  . Marital status: Single    Spouse name: Not on file  . Number of children: Not on file  . Years of education: Not on file  . Highest education level: Not on file  Occupational History  . Not on file  Social Needs  . Financial resource strain: Not on file  . Food insecurity:    Worry: Not on file    Inability: Not on file  . Transportation needs:     Medical: Not on file    Non-medical: Not on file  Tobacco Use  . Smoking status: Former Smoker    Packs/day: 0.50    Years: 40.00    Pack years: 20.00    Types: Cigarettes    Last attempt to quit: 01/19/2018    Years since quitting: 0.5  . Smokeless tobacco: Never Used  Substance and Sexual Activity  . Alcohol use: Yes    Alcohol/week: 5.0 - 6.0 standard drinks    Types: 5 - 6 Glasses of wine per week  . Drug use: Yes    Types: Marijuana    Comment: as a teenager  . Sexual activity: Not Currently    Birth control/protection: None  Lifestyle  . Physical activity:    Days  per week: Not on file    Minutes per session: Not on file  . Stress: Not on file  Relationships  . Social connections:    Talks on phone: Not on file    Gets together: Not on file    Attends religious service: Not on file    Active member of club or organization: Not on file    Attends meetings of clubs or organizations: Not on file    Relationship status: Not on file  . Intimate partner violence:    Fear of current or ex partner: Not on file    Emotionally abused: Not on file    Physically abused: Not on file    Forced sexual activity: Not on file  Other Topics Concern  . Not on file  Social History Narrative  . Not on file     Review of Systems    Review of Systems  Constitutional: Positive for malaise/fatigue. Negative for chills, diaphoresis, fever and weight loss.  HENT: Negative for congestion and sore throat.   Eyes: Negative for blurred vision and double vision.  Respiratory: Positive for cough and shortness of breath. Negative for hemoptysis and sputum production.   Cardiovascular: Positive for chest pain, palpitations, orthopnea (stable) and leg swelling. Negative for PND.  Gastrointestinal: Positive for constipation. Negative for abdominal pain, blood in stool, nausea and vomiting.  Genitourinary: Negative for hematuria.  Musculoskeletal: Negative for myalgias.  Neurological: Positive  for dizziness, tingling, weakness and headaches. Negative for loss of consciousness.  Endo/Heme/Allergies: Does not bruise/bleed easily.  Psychiatric/Behavioral: Positive for substance abuse (tobacco use).    Physical Exam    Blood pressure 121/63, pulse (!) 57, temperature 97.7 F (36.5 C), temperature source Oral, resp. rate (!) 21, height 5\' 1"  (1.549 m), weight 78 kg, SpO2 94 %.  General: 62 y.o. obese Caucasian female resting comfortably in no acute distress. Pleasant and cooperative. HEENT: Normocephalic and atraumatic. Anicteric sclera. EOMs intact.  Neck: Supple. No bruits or JVD appreciated. Lungs: No increased work of breathing. Diminished breath sounds but clear to ausculation bilaterally. No wheezes, rhonchi, or rales. Heart: Slightly bradycardic but regular rhythm. Distinct S1 and S2. No murmurs, gallops, or rubs.  Abdomen: Abdomen soft, obese, and non-tender to palpation. Bowel sounds present in all 4 quadrants.   Extremities: No lower extremity edema. Distal pedal pulses and radial pulses 2+ and equal bilaterally. Neuro: Alert and oriented x3. No focal deficits. Moves all extremities spontaneously. Psych: Normal affect.  Labs    Troponin [facility] Test) Recent Labs    07/30/18 1103  TROPIPOC 0.00   No results for input(s): CKTOTAL, CKMB, TROPONINI in the last 72 hours. Lab Results  Component Value Date   WBC 6.6 07/30/2018   HGB 16.7 (H) 07/30/2018   HCT 53.4 (H) 07/30/2018   MCV 89.7 07/30/2018   PLT 171 07/30/2018    Recent Labs  Lab 07/30/18 1049  NA 142  K 3.9  CL 103  CO2 31  BUN 8  CREATININE 0.51  CALCIUM 9.2  GLUCOSE 128*   Lab Results  Component Value Date   CHOL 237 (H) 09/03/2017   HDL 29.00 (L) 09/03/2017   LDLCALC 68 02/02/2014   TRIG 302.0 (H) 09/03/2017   Lab Results  Component Value Date   DDIMER 0.84 (H) 12/08/2016     Radiology Studies    Dg Chest 2 View  Result Date: 07/30/2018 CLINICAL DATA:  Acute chest pain and  shortness of breath. EXAM: CHEST -  2 VIEW COMPARISON:  04/15/2018 and prior exams FINDINGS: Cardiomegaly and CABG changes again noted. Mild pulmonary vascular congestion is identified. There is no evidence of focal airspace disease, pulmonary edema, suspicious pulmonary nodule/mass, pleural effusion, or pneumothorax. No acute bony abnormalities are identified. IMPRESSION: Cardiomegaly with mild pulmonary vascular congestion. Electronically Signed   By: Margarette Canada M.D.   On: 07/30/2018 11:39    EKG     EKG: EKG was personally reviewed and demonstrates: Sinus bradycardia, rate 56, with no significant ST changes from prior tracings.   Telemetry: Telemetry was personally reviewed and demonstrates: Sinus bradycardia.  Cardiac Imaging   24- Hour Holter Monitor Interpretation 04/27/2018: Results: - 68 PVCs (premature ventricular contractions). - 130 PACs (premature atrial contractions). - Brief paroxysmal atrial tachycardia, 6 beats, 130 bpm. - No atrial fibrillation, no pauses, no ventricular tachycardia.  Recommendations: - Reassuring monitor. Continue Carvedilol.  _______________   Echocardiogram 02/09/2017: Study Conclusions: - Left ventricle: The cavity size was normal. Systolic function was   normal. The estimated ejection fraction was in the range of 60%   to 65%. Wall motion was normal; there were no regional wall   motion abnormalities. Features are consistent with a pseudonormal   left ventricular filling pattern, with concomitant abnormal   relaxation and increased filling pressure (grade 2 diastolic   dysfunction). Doppler parameters are consistent with high   ventricular filling pressure. - Aortic valve: Trileaflet; normal thickness, mildly calcified   leaflets. - Mitral valve: There was trivial regurgitation. - Pulmonary arteries: PA peak pressure: 32 mm Hg (S).  _______________  Nuclear Stress Test  07/01/2013: Impression: 1. Decrease counts within the mid and basilar  segment of the inferior wall are concerning for reversible ischemia (of note, there is a gut activity on the stress images which could exaggerate these findings.) 2. Mild dyskinesia of the inferior wall 3. Ejection fraction equals 61%  Assessment & Plan    1. Atypical Chest Pain - Patient presented to ED with atypical chest pain that occurred at rest. Patient reports episode of sharp chest pain that lasted for 30 seconds with associated palpitations, lightheadedness, and dizziness.  - EKG showed sinus bradycardia, rate 56 bpm, with no acute ST changes. - I-stat troponin negative. Will continue to trend troponins. - Patient denies any angina at this time.  - Continue Aspirin 81mg  daily. - Continue Coreg 12.5mg  twice daily.  - Presentation is atypical but given history of CAD s/p CABG and cardiovascular risk factors (hypertension, hyperlipidemia, obesity, and tobacco use), patient would benefit from ischemic workup with stress test. Dr. Harrell Gave discussed recommendation in detail with patient. However, patient states that she has an appointment with Dr. Marlou Porch tomorrow and insisted on going home. Dr. Harrell Gave recommended the patient ask about outpatient stress test tomorrow.   2. CAD s/p CABG x3  - Patient s/p CABG x3 with  LIMA to LAD, RIMA to RCA, SVG to OM. - Most recent Myoview in 2014 was concerning for possible reversible ischemia within the mid and basilar segment of the inferior wall with mild dyskinesia of the inferior wall; however, there was gut activity on the stress images which could exaggerate these findings. EF was 61%.  - Continue Aspirin as above. - Continue Coreg as above. - Would recommended daily statin use. Given CAD, LDL goal is <70.   3. Hypertension  - Most recent BP 126/60. - Continue Coreg 12.5mg  twice daily.  - Continue Imdur 60mg  daily.  4. Hyperlipidemia - LDL 157 in 08/2017.  -  Per Dr. Marlou Porch office visit note on 04/13/2018, LDL is 68 when she takes  Crestor 20mg  daily. - Recommended daily use of Crestor 20mg .   5. Grade 2 Diastolic Dysfunction - Echo 02/09/2017 showed LVEF of 60-65% with no regional wall motion abnormalities and grade 2 diastolic heart failure.  - Difficult to determine if symptoms are related to heart failure or COPD. - Patient does not appear volume overloaded on exam. - Will check BNP. - Continue Lasix 40mg  daily.   6. COPD on Home O2 at Night - Followed by Pulmonology   7. Tobacco Use - Reemphasized the importance of complete cessation of tobacco.  - Patient states she wanted to try Chantix but her insurance would not cover it.   Signed, Darreld Mclean, PA-C 07/30/2018, 2:27 PM  For questions or updates, please contact   Please consult [email or web address] for contact info under Cardiology/STEMI.

## 2018-07-30 NOTE — Discharge Instructions (Signed)
It was my pleasure taking care of you today!   Please keep your appointment with Dr. Marlou Porch tomorrow. Let him know that you were seen by the cardiology service who recommended you stay in the hospital to have a stress test.   Return to ER for new or worsening symptoms, any additional concerns.

## 2018-07-30 NOTE — ED Provider Notes (Signed)
[facility] EMERGENCY DEPARTMENT Provider Note   CSN: [id number] Arrival date & time: 07/30/18  1033     History   Chief Complaint Chief Complaint  Patient presents with  . Chest Pain  . Shortness of Breath    HPI [name] is a 62 y.o. female.  The history is provided by the patient and medical records. No language interpreter was used.  Chest Pain   Associated symptoms include shortness of breath.  Shortness of Breath  Associated symptoms include chest pain.    [name] is a 62 y.o. female  with a PMH of CAD s/p CABG, HTN, HLD who presents to the Emergency Department complaining of chest pressure which she describes as "feeling full" across the chest wall.  Symptoms started at around 8 AM.  Associated with bilateral arm tingling and shortness of breath.  She has oxygen which she uses when at home and at night (2 L).  She put oxygen on and in about 30 minutes, symptoms resolved.  She walked to her car to come to the hospital and pain returned.  She denies any back pain, abdominal pain, nausea, vomiting or diaphoresis.  Feels different than her pain when she received CABG.  She sees Dr. Marlou Porch of cardiology.  She actually has an appointment with him tomorrow morning at 9 AM.    Past Medical History:  Diagnosis Date  . Bronchitis   . CAD (coronary artery disease)    a. 02/2002: CABG x3 with LIMA to LAD, RIMA to RCA, and SVG to OM  . CHF (congestive heart failure) ([place])   . COPD (chronic obstructive pulmonary disease) ([place])   . Headache   . Heart disease   . Hyperlipidemia   . Hypertension   . Impaired glucose tolerance 08/20/2014  . Sinusitis     Patient Active Problem List   Diagnosis Date Noted  . Acute upper respiratory infection 12/02/2017  . COPD exacerbation ([place]) 09/03/2017  . Tobacco abuse 03/07/2017  . Sinusitis, acute 04/08/2016  . Left lumbar radiculopathy 10/19/2015  . Cough 08/29/2015  . Pedal edema 02/22/2015  .  Impaired glucose tolerance 08/20/2014  . Pruritus 07/08/2014  . Nocturnal hypoxemia 07/05/2014  . Esophagus, foreign body 06/12/2014  . Polycythemia, secondary 02/16/2014  . 5 mm Lung nodule, solitary 02/02/2014  . GERD (gastroesophageal reflux disease) 02/01/2014  . Hypersomnolence 02/01/2014  . Allergic rhinitis 09/03/2013  . CAD (coronary artery disease) 09/03/2013  . Encounter for well adult exam with abnormal findings 09/03/2013  . Essential hypertension 07/07/2013  . COPD (chronic obstructive pulmonary disease) with chronic bronchitis ([place]) 07/07/2013  . Status post coronary artery bypass grafting 07/07/2013  . Headache 07/01/2013  . Hypertensive emergency 07/01/2013  . Polycythemia 07/01/2013    Past Surgical History:  Procedure Laterality Date  . CORONARY ARTERY BYPASS GRAFT  2003   Triple bypass  . ESOPHAGOGASTRODUODENOSCOPY Left 06/13/2014   Procedure: ESOPHAGOGASTRODUODENOSCOPY (EGD);  Surgeon: Juanita Craver, MD;  Location: [facility] ENDOSCOPY;  Service: Endoscopy;  Laterality: Left;  . RIGID ESOPHAGOSCOPY N/A 06/12/2014   Procedure: RIGID ESOPHAGOSCOPY WITH FOREIGN BODY REMOVAL;  Surgeon: Ascencion Dike, MD;  Location: [facility] OR;  Service: ENT;  Laterality: N/A;     OB History   None      Home Medications    Prior to Admission medications   Medication Sig Start Date End Date Taking? Authorizing Provider  acetaminophen (TYLENOL) 500 MG tablet Take 1,000 mg by mouth at bedtime as needed for  headache.   Yes [provider]  albuterol (PROVENTIL HFA) 108 (90 Base) MCG/ACT inhaler INHALE 1-2 PUFFS INTO THE LUNGS EVERY 6 (SIX) HOURS AS NEEDED FOR WHEEZING OR SHORTNESS OF BREATH. 06/15/18  Yes Fenton Foy, NP  amLODipine (NORVASC) 5 MG tablet TAKE 1 TABLET BY MOUTH EVERY DAY 02/12/18  Yes Jerline Pain, MD  aspirin EC 81 MG tablet Take 1 tablet (81 mg total) by mouth daily. 07/02/13  Yes Olam Idler, MD  BREO ELLIPTA 200-25 MCG/INH AEPB TAKE 1 PUFF BY MOUTH EVERY  DAY Patient taking differently: Inhale 1 puff into the lungs daily.  07/21/18  Yes Magdalen Spatz, NP  carvedilol (COREG) 12.5 MG tablet TAKE 1 TABLET (12.5 MG TOTAL) BY MOUTH 2 (TWO) TIMES DAILY. Patient taking differently: Take 12.5 mg by mouth 2 (two) times daily with a meal.  02/12/18  Yes Jerline Pain, MD  cholecalciferol (VITAMIN D) 1000 units tablet Take 1,000 Units by mouth daily.   Yes [provider]  fluticasone (FLONASE) 50 MCG/ACT nasal spray PLACE 2 SPRAYS INTO BOTH NOSTRILS DAILY. Patient taking differently: Place 2 sprays into both nostrils as needed for allergies.  09/22/14  Yes Biagio Borg, MD  furosemide (LASIX) 40 MG tablet Take 40 mg by mouth daily.   Yes [provider]  ipratropium-albuterol (DUONEB) 0.5-2.5 (3) MG/3ML SOLN TAKE 3 MLS BY NEBULIZATION EVERY 6 (SIX) HOURS AS NEEDED. 04/13/18  Yes Biagio Borg, MD  isosorbide mononitrate (IMDUR) 60 MG 24 hr tablet TAKE 1 & 1/2 TABLET BY MOUTH DAILY Patient taking differently: Take 90 mg by mouth daily.  02/12/18  Yes Jerline Pain, MD  KLOR-CON M10 10 MEQ tablet TAKE 1 TABLET BY MOUTH EVERY DAY Patient taking differently: 5 mEq daily.  05/11/18  Yes Biagio Borg, MD  vitamin B-12 (CYANOCOBALAMIN) 1000 MCG tablet Take 1,000 mcg by mouth daily.   Yes [provider]  vitamin C (ASCORBIC ACID) 500 MG tablet Take 1,000 mg by mouth daily.   Yes [provider]  predniSONE (DELTASONE) 10 MG tablet 4 tabs for 2 days, then 3 tabs for 2 days, 2 tabs for 2 days, then 1 tab for 2 days, then stop Patient not taking: Reported on 07/30/2018 04/15/18   Lauraine Rinne, NP    Family History Family History  Problem Relation Age of Onset  . Lung cancer Paternal Uncle   . Heart disease Mother   . Brain cancer Mother   . Cancer Maternal Grandmother        colon  . Stroke Other   . Cancer Unknown        x 4 aunts  . Lung cancer Maternal Aunt   . Suicidality Brother     Social History Social History    Tobacco Use  . Smoking status: Former Smoker    Packs/day: 0.50    Years: 40.00    Pack years: 20.00    Types: Cigarettes    Last attempt to quit: 01/19/2018    Years since quitting: 0.5  . Smokeless tobacco: Never Used  Substance Use Topics  . Alcohol use: Yes    Alcohol/week: 5.0 - 6.0 standard drinks    Types: 5 - 6 Glasses of wine per week  . Drug use: Yes    Types: Marijuana    Comment: as a teenager     Allergies   Augmentin [amoxicillin-pot clavulanate]   Review of Systems Review of Systems  Respiratory: Positive  for shortness of breath.   Cardiovascular: Positive for chest pain.  All other systems reviewed and are negative.    Physical Exam Updated Vital Signs BP 138/82   Pulse (!) 57   Temp 97.7 F (36.5 C) (Oral)   Resp (!) 23   Ht 5\' 1"  (1.549 m)   Wt 78 kg   SpO2 95%   BMI 32.50 kg/m   Physical Exam  Constitutional: She is oriented to person, place, and time. She appears well-developed and well-nourished. No distress.  HENT:  Head: Normocephalic and atraumatic.  Cardiovascular: Normal rate, regular rhythm and normal heart sounds.  No murmur heard. Pulmonary/Chest: Effort normal and breath sounds normal. No respiratory distress.  Abdominal: Soft. She exhibits no distension. There is no tenderness.  Musculoskeletal: She exhibits no edema.  Neurological: She is alert and oriented to person, place, and time.  Skin: Skin is warm and dry.  Nursing note and vitals reviewed.    ED Treatments / Results  Labs (all labs ordered are listed, but only abnormal results are displayed) Labs Reviewed  BASIC METABOLIC PANEL - Abnormal; Notable for the following components:      Result Value   Glucose, Bld 128 (*)    All other components within normal limits  CBC - Abnormal; Notable for the following components:   RBC 5.95 (*)    Hemoglobin 16.7 (*)    HCT 53.4 (*)    All other components within normal limits  BRAIN NATRIURETIC PEPTIDE  I-STAT  TROPONIN, ED    EKG EKG Interpretation  Date/Time:  Thursday July 30 2018 10:36:14 EDT Ventricular Rate:  56 PR Interval:  188 QRS Duration: 74 QT Interval:  432 QTC Calculation: 416 R Axis:   83 Text Interpretation:  Sinus bradycardia No significant change since last tracing Confirmed by Virgel Manifold [id number]) on 07/30/2018 12:10:49 PM   Radiology Dg Chest 2 View  Result Date: 07/30/2018 CLINICAL DATA:  Acute chest pain and shortness of breath. EXAM: CHEST - 2 VIEW COMPARISON:  04/15/2018 and prior exams FINDINGS: Cardiomegaly and CABG changes again noted. Mild pulmonary vascular congestion is identified. There is no evidence of focal airspace disease, pulmonary edema, suspicious pulmonary nodule/mass, pleural effusion, or pneumothorax. No acute bony abnormalities are identified. IMPRESSION: Cardiomegaly with mild pulmonary vascular congestion. Electronically Signed   By: Margarette Canada M.D.   On: 07/30/2018 11:39    Procedures Procedures (including critical care time)  Medications Ordered in ED Medications - No data to display   Initial Impression / Assessment and Plan / ED Course  I have reviewed the triage vital signs and the nursing notes.  Pertinent labs & imaging results that were available during my care of the patient were reviewed by me and considered in my medical decision making (see chart for details).    CHELSIE BUREL is a 62 y.o. female who presents to ED for chest pain shortness of breath which began this morning around 8 AM.  Symptoms appear slightly atypical for ACS, however she does have history of CABG and followed by cardiology.  Cardiology was consulted who agrees symptom presentation is atypical, however given her history and risk factors, recommend she be admitted for ischemic work-up with stress test.  Per cardiology service, this was discussed at length at bedside, however patient would like to go home.  I spoke with the patient about the risks of  leaving without full evaluation as well. I have specifically discussed that without further evaluation I cannot guarantee  there is not a life threatening event occuring. Pt is A&Ox4, her own POA and states understanding of my concerns and the possible consequences.  She does have an appointment with her cardiologist tomorrow morning.  Myself and cardiology have recommended that she ask about an outpatient stress test at the appointment tomorrow.  She is aware that she should return to the hospital immediately for new or worsening symptoms and can return at any time.    Final Clinical Impressions(s) / ED Diagnoses   Final diagnoses:  Chest pain, unspecified type    ED Discharge Orders    None       Ward, Ozella Almond, PA-C 07/30/18 1635    Virgel Manifold, MD 07/31/18 1136

## 2018-07-30 NOTE — ED Triage Notes (Signed)
Pt reports sharp CP across chest and SOB that started at 8:15 that radiated to both shoulders and arm. CP resolved, just SOB now. Pt on O2 as needed. 95% RA

## 2018-07-30 NOTE — ED Notes (Signed)
Pt ambulatory to bathroom with no reported issues. 

## 2018-07-30 NOTE — ED Notes (Signed)
Discharge instructions discussed with Pt. Pt verbalized understanding. Pt stable and leaving via WC.    

## 2018-07-30 NOTE — ED Notes (Signed)
Pt given turkey sandwich and sprite.  

## 2018-07-31 ENCOUNTER — Encounter: Payer: Self-pay | Admitting: Cardiology

## 2018-07-31 ENCOUNTER — Ambulatory Visit: Payer: 59 | Admitting: Cardiology

## 2018-07-31 VITALS — BP 128/60 | HR 88 | Ht 61.0 in | Wt 171.4 lb

## 2018-07-31 DIAGNOSIS — R0789 Other chest pain: Secondary | ICD-10-CM | POA: Diagnosis not present

## 2018-07-31 DIAGNOSIS — I209 Angina pectoris, unspecified: Secondary | ICD-10-CM

## 2018-07-31 DIAGNOSIS — J438 Other emphysema: Secondary | ICD-10-CM | POA: Diagnosis not present

## 2018-07-31 DIAGNOSIS — I10 Essential (primary) hypertension: Secondary | ICD-10-CM

## 2018-07-31 DIAGNOSIS — Z720 Tobacco use: Secondary | ICD-10-CM | POA: Diagnosis not present

## 2018-07-31 DIAGNOSIS — Z951 Presence of aortocoronary bypass graft: Secondary | ICD-10-CM

## 2018-07-31 MED ORDER — ROSUVASTATIN CALCIUM 20 MG PO TABS: 20.0000 mg | ORAL_TABLET | Freq: Every day | ORAL | 11 refills | Status: DC

## 2018-07-31 NOTE — Progress Notes (Signed)
Cardiology Office Note:    Date:  07/31/2018   ID:  [name], DOB 13-Apr-1956, MRN [id number]  PCP:  Biagio Borg, MD  Cardiologist:  Candee Furbish, MD  Electrophysiologist:  None   Referring MD: Biagio Borg, MD     History of Present Illness:    [name] is a 62 y.o. female with coronary artery disease status post bypass 2003 here for follow-up chest pain after being in the emergency department yesterday.  She actually had this appointment scheduled prior to her visit to the ER.  She is been to the emergency room previously with COPD exacerbation as well.  She is complaining of chest pressure in the emergency room on 07/30/2018 with fullness across the chest wall bilateral arm tingling and shortness of breath.  She does have oxygen at home at night.  After putting oxygen on her symptoms seem to get better when she rested.  When she walked to the car pain then returned.  This feels different to her than the way she felt prior to her CABG.  Again HA, shoulder and arm tingling, getting up to go to work, CP, trouble breath. O2 helped. Resolved. No sweats. Went to work, but when in car felt it again, O2 helped in car. At work panic.   Bone ends were negative in the ER, hemoglobin 16.7, creatinine 0.5 EKG on 07/30/2018 looked like sinus bradycardia 56 with no other abnormal allergies.  Past Medical History:  Diagnosis Date  . Bronchitis   . CAD (coronary artery disease)    a. 02/2002: CABG x3 with LIMA to LAD, RIMA to RCA, and SVG to OM  . CHF (congestive heart failure) ([place])   . COPD (chronic obstructive pulmonary disease) ([place])   . Headache   . Heart disease   . Hyperlipidemia   . Hypertension   . Impaired glucose tolerance 08/20/2014  . Sinusitis     Past Surgical History:  Procedure Laterality Date  . CORONARY ARTERY BYPASS GRAFT  2003   Triple bypass  . ESOPHAGOGASTRODUODENOSCOPY Left 06/13/2014   Procedure: ESOPHAGOGASTRODUODENOSCOPY (EGD);  Surgeon:  Juanita Craver, MD;  Location: [facility] ENDOSCOPY;  Service: Endoscopy;  Laterality: Left;  . RIGID ESOPHAGOSCOPY N/A 06/12/2014   Procedure: RIGID ESOPHAGOSCOPY WITH FOREIGN BODY REMOVAL;  Surgeon: Ascencion Dike, MD;  Location: [facility] OR;  Service: ENT;  Laterality: N/A;    Current Medications: Current Meds  Medication Sig  . acetaminophen (TYLENOL) 500 MG tablet Take 1,000 mg by mouth at bedtime as needed for headache.  . albuterol (PROVENTIL HFA) 108 (90 Base) MCG/ACT inhaler INHALE 1-2 PUFFS INTO THE LUNGS EVERY 6 (SIX) HOURS AS NEEDED FOR WHEEZING OR SHORTNESS OF BREATH.  Marland Kitchen amLODipine (NORVASC) 5 MG tablet TAKE 1 TABLET BY MOUTH EVERY DAY  . BREO ELLIPTA 200-25 MCG/INH AEPB TAKE 1 PUFF BY MOUTH EVERY DAY  . carvedilol (COREG) 12.5 MG tablet TAKE 1 TABLET (12.5 MG TOTAL) BY MOUTH 2 (TWO) TIMES DAILY.  . cholecalciferol (VITAMIN D) 1000 units tablet Take 1,000 Units by mouth daily.  . furosemide (LASIX) 40 MG tablet Take 40 mg by mouth daily.  Marland Kitchen ipratropium-albuterol (DUONEB) 0.5-2.5 (3) MG/3ML SOLN TAKE 3 MLS BY NEBULIZATION EVERY 6 (SIX) HOURS AS NEEDED.  Marland Kitchen isosorbide mononitrate (IMDUR) 60 MG 24 hr tablet TAKE 1 & 1/2 TABLET BY MOUTH DAILY  . KLOR-CON M10 10 MEQ tablet TAKE 1 TABLET BY MOUTH EVERY DAY  . vitamin B-12 (CYANOCOBALAMIN) 1000 MCG tablet Take 1,000 mcg  by mouth daily.  . vitamin C (ASCORBIC ACID) 500 MG tablet Take 1,000 mg by mouth daily.     Allergies:   Augmentin [amoxicillin-pot clavulanate]   Social History   Socioeconomic History  . Marital status: Single    Spouse name: Not on file  . Number of children: Not on file  . Years of education: Not on file  . Highest education level: Not on file  Occupational History  . Not on file  Social Needs  . Financial resource strain: Not on file  . Food insecurity:    Worry: Not on file    Inability: Not on file  . Transportation needs:    Medical: Not on file    Non-medical: Not on file  Tobacco Use  . Smoking status: Former  Smoker    Packs/day: 0.50    Years: 40.00    Pack years: 20.00    Types: Cigarettes    Last attempt to quit: 01/19/2018    Years since quitting: 0.5  . Smokeless tobacco: Never Used  Substance and Sexual Activity  . Alcohol use: Yes    Alcohol/week: 5.0 - 6.0 standard drinks    Types: 5 - 6 Glasses of wine per week  . Drug use: Yes    Types: Marijuana    Comment: as a teenager  . Sexual activity: Not Currently    Birth control/protection: None  Lifestyle  . Physical activity:    Days per week: Not on file    Minutes per session: Not on file  . Stress: Not on file  Relationships  . Social connections:    Talks on phone: Not on file    Gets together: Not on file    Attends religious service: Not on file    Active member of club or organization: Not on file    Attends meetings of clubs or organizations: Not on file    Relationship status: Not on file  Other Topics Concern  . Not on file  Social History Narrative  . Not on file     Family History: The patient's family history includes Brain cancer in her mother; Cancer in her maternal grandmother and unknown relative; Heart disease in her mother; Lung cancer in her maternal aunt and paternal uncle; Stroke in her other; Suicidality in her brother.  ROS:   Please see the history of present illness.    Denies any fevers chills nausea vomiting syncope bleeding all other systems reviewed and are negative.  EKGs/Labs/Other Studies Reviewed:    The following studies were reviewed today:   ECHO: 02/09/17: - Left ventricle: The cavity size was normal. Systolic function was normal. The estimated ejection fraction was in the range of 60% to 65%. Wall motion was normal; there were no regional wall motion abnormalities. Features are consistent with a pseudonormal left ventricular filling pattern, with concomitant abnormal relaxation and increased filling pressure (grade 2 diastolic dysfunction). Doppler parameters are  consistent with high ventricular filling pressure. - Aortic valve: Trileaflet; normal thickness, mildly calcified leaflets. - Mitral valve: There was trivial regurgitation. - Pulmonary arteries: PA peak pressure: 32 mm Hg (S). No cardiomegaly.     EKG:  EKG is not ordered today.  Prior EKG as described above in ER personally reviewed no ischemic changes.  Sinus bradycardia  Recent Labs: 09/03/2017: ALT 34; TSH 3.57 01/11/2018: B Natriuretic Peptide 70.0 07/30/2018: BUN 8; Creatinine, Ser 0.51; Hemoglobin 16.7; Platelets 171; Potassium 3.9; Sodium 142  Recent Lipid Panel  Component Value Date/Time   CHOL 237 (H) 09/03/2017 1538   TRIG 302.0 (H) 09/03/2017 1538   HDL 29.00 (L) 09/03/2017 1538   CHOLHDL 8 09/03/2017 1538   VLDL 60.4 (H) 09/03/2017 1538   LDLCALC 68 02/02/2014 0317   LDLDIRECT 157.0 09/03/2017 1538    Physical Exam:    VS:  BP 128/60   Pulse 88   Ht 5\' 1"  (1.549 m)   Wt 171 lb 6.4 oz (77.7 kg)   SpO2 92%   BMI 32.39 kg/m     Wt Readings from Last 3 Encounters:  07/31/18 171 lb 6.4 oz (77.7 kg)  07/30/18 172 lb (78 kg)  04/15/18 173 lb 3.2 oz (78.6 kg)     GEN:  Well nourished, well developed in no acute distress HEENT: Normal NECK: No JVD; No carotid bruits LYMPHATICS: No lymphadenopathy CARDIAC: CABG scar noted, RRR, no murmurs, rubs, gallops RESPIRATORY:  Clear to auscultation without rales, wheezing or rhonchi  ABDOMEN: Soft, non-tender, non-distended MUSCULOSKELETAL:  No edema; No deformity  SKIN: Warm and dry NEUROLOGIC:  Alert and oriented x 3 PSYCHIATRIC:  Normal affect   ASSESSMENT:    1. Chest tightness   2. Angina pectoris ([place])   3. Other emphysema ([place])   4. Tobacco use   5. Essential hypertension   6. Status post coronary artery bypass grafting    PLAN:    In order of problems listed above:  Angina, CAD post CABG - ER visit reviewed as above.  Chest discomfort, tightness relieved with oxygen. -We will go ahead and  check a pharmacologic stress test, treadmill stress test nuclear if possible. - Prior echocardiogram showed normal EF.  Troponin was normal, EKG unremarkable with no ischemic changes. -Continue with aspirin carvedilol isosorbide amlodipine. -If stress test is high risk, we will likely pursue cardiac catheterization.  Last cath was in 2012.  Bypass was in 2003.  COPD - Could be playing a role as well and her symptoms.  Symptoms were relieved with oxygen.  Dr. Elsworth Soho is her pulmonologist.  Medications reviewed.  Hyperlipidemia - Restarting her Crestor 20 mg once a day.  She has not been taking her statin.  Recommended that she may wish to try coenzyme Q 10 as well.  Tobacco use -Continue to encourage cessation.  Places her at high risk.  She was here today with her son Georgina Snell.   Medication Adjustments/Labs and Tests Ordered: Current medicines are reviewed at length with the patient today.  Concerns regarding medicines are outlined above.  Orders Placed This Encounter  Procedures  . MYOCARDIAL PERFUSION IMAGING   Meds ordered this encounter  Medications  . rosuvastatin (CRESTOR) 20 MG tablet    Sig: Take 1 tablet (20 mg total) by mouth daily.    Dispense:  30 tablet    Refill:  11    Patient Instructions  Medication Instructions:  1) START CRESTOR 20 mg daily 2) you can try CoQ-10 along with your Crestor If you need a refill on your cardiac medications before your next appointment, please call your pharmacy.   Lab work: None ordered  Testing/Procedures: Dr. Marlou Porch recommends you have a NUCLEAR STRESS TEST.  Follow-Up: At [facility], you and your health needs are our priority.  As part of our continuing mission to provide you with exceptional heart care, we have created designated Provider Care Teams.  These Care Teams include your primary Cardiologist (physician) and Advanced Practice Providers (APPs -  Physician Assistants and Nurse Practitioners) who all  work together to  provide you with the care you need, when you need it. You will need a follow up appointment in 6 months.  Please call our office 2 months in advance to schedule this appointment.  You may see Candee Furbish, MD or one of the following Advanced Practice Providers on your designated Care Team:   Truitt Merle, NP Cecilie Kicks, NP . Kathyrn Drown, NP      Signed, Candee Furbish, MD  07/31/2018 10:05 AM    [place]

## 2018-07-31 NOTE — Patient Instructions (Addendum)
Medication Instructions:  1) START CRESTOR 20 mg daily 2) you can try CoQ-10 along with your Crestor If you need a refill on your cardiac medications before your next appointment, please call your pharmacy.   Lab work: None ordered  Testing/Procedures: Dr. Marlou Porch recommends you have a NUCLEAR STRESS TEST.  Follow-Up: At [facility], you and your health needs are our priority.  As part of our continuing mission to provide you with exceptional heart care, we have created designated Provider Care Teams.  These Care Teams include your primary Cardiologist (physician) and Advanced Practice Providers (APPs -  Physician Assistants and Nurse Practitioners) who all work together to provide you with the care you need, when you need it. You will need a follow up appointment in 6 months.  Please call our office 2 months in advance to schedule this appointment.  You may see Candee Furbish, MD or one of the following Advanced Practice Providers on your designated Care Team:   Truitt Merle, NP Cecilie Kicks, NP . Kathyrn Drown, NP

## 2018-08-11 ENCOUNTER — Telehealth (HOSPITAL_COMMUNITY): Payer: Self-pay

## 2018-08-11 NOTE — Telephone Encounter (Signed)
Left a detailed message on the pt's answering machine. Instructed to call us with any questions. S.Latunya Kissick EMTP

## 2018-08-13 ENCOUNTER — Ambulatory Visit (HOSPITAL_COMMUNITY): Payer: 59 | Attending: Cardiology

## 2018-08-13 DIAGNOSIS — I209 Angina pectoris, unspecified: Secondary | ICD-10-CM

## 2018-08-13 DIAGNOSIS — R0789 Other chest pain: Secondary | ICD-10-CM

## 2018-08-13 LAB — MYOCARDIAL PERFUSION IMAGING
CHL CUP RESTING HR STRESS: 61 {beats}/min
CSEPPHR: 91 {beats}/min
LV sys vol: 24 mL
LVDIAVOL: 70 mL (ref 46–106)
SDS: 3
SRS: 1
SSS: 4
TID: 0.96

## 2018-08-13 MED ORDER — REGADENOSON 0.4 MG/5ML IV SOLN
0.4000 mg | Freq: Once | INTRAVENOUS | Status: AC
  Administered 2018-08-13: 0.4 mg via INTRAVENOUS

## 2018-08-13 MED ORDER — TECHNETIUM TC 99M TETROFOSMIN IV KIT
32.1000 | PACK | Freq: Once | INTRAVENOUS | Status: AC | PRN
  Administered 2018-08-13: 32.1 via INTRAVENOUS
  Filled 2018-08-13: qty 33

## 2018-08-13 MED ORDER — TECHNETIUM TC 99M TETROFOSMIN IV KIT
10.3000 | PACK | Freq: Once | INTRAVENOUS | Status: AC | PRN
  Administered 2018-08-13: 10.3 via INTRAVENOUS
  Filled 2018-08-13: qty 11

## 2018-09-09 ENCOUNTER — Telehealth: Payer: Self-pay | Admitting: Pulmonary Disease

## 2018-09-09 NOTE — Telephone Encounter (Signed)
Called patient unable to reach left message to give us a call back.

## 2018-09-10 NOTE — Telephone Encounter (Signed)
Attempted to call pt but unable to reach her. Left message for pt to return call. 

## 2018-09-11 NOTE — Telephone Encounter (Signed)
Sorry to hear the patient is not feeling well.  With patient's symptoms she should keep her office visit on 09/15/2018.  Or patient can be seen sooner than that.  Does she have any fevers?  Wheezing?  I need to know more information regarding her breathing.  Or you can offer in a office visit to be seen today or Monday?  Wyn Quaker FNP

## 2018-09-11 NOTE — Telephone Encounter (Signed)
Patient was made aware in previous call, that if she feels like her symptoms get worse, she needs to be seen at ED.  Nothing further at this time.

## 2018-09-11 NOTE — Telephone Encounter (Signed)
Noted. Thank you for calling her.   If symptoms worsen she needs to present to emergency room.   Wyn Quaker FNP

## 2018-09-11 NOTE — Telephone Encounter (Signed)
Called and spoke with pt who stated she feels like she has a chest cold and is coughing up clear mucus and is having increased chest tightness.  Pt has had increased SOB and states she has been having to use her neb machine about every 4-5 hours. Pt's symptoms started 2 days ago.  Pt is requesting something to be prescribed to help with her symptoms.   Aaron Edelman, please advise on this for pt. Thanks!

## 2018-09-11 NOTE — Telephone Encounter (Signed)
Called and spoke with Patient.  She stated that she has had no fever or chills, but she is having wheezing.  She stated that she is using her nebs and that seems to help her wheezing.  Wyn Quaker, NP recommendations given. She stated that she could not come to a OV today, but she could come Monday afternoon.  Appointment scheduled for 09/14/18, at 4:15, with Wyn Quaker, NP.  09/15/18 appointment with Dr. Elsworth Soho, has been cancelled.  Nothing further at this time.  Will route to Wyn Quaker, NP as Juluis Rainier

## 2018-09-14 ENCOUNTER — Encounter: Payer: Self-pay | Admitting: Pulmonary Disease

## 2018-09-14 ENCOUNTER — Ambulatory Visit (INDEPENDENT_AMBULATORY_CARE_PROVIDER_SITE_OTHER): Payer: 59 | Admitting: Pulmonary Disease

## 2018-09-14 VITALS — BP 130/90 | HR 66 | Ht 61.0 in | Wt 176.0 lb

## 2018-09-14 DIAGNOSIS — Z720 Tobacco use: Secondary | ICD-10-CM | POA: Diagnosis not present

## 2018-09-14 DIAGNOSIS — J441 Chronic obstructive pulmonary disease with (acute) exacerbation: Secondary | ICD-10-CM

## 2018-09-14 DIAGNOSIS — J449 Chronic obstructive pulmonary disease, unspecified: Secondary | ICD-10-CM

## 2018-09-14 MED ORDER — PREDNISONE 10 MG PO TABS: ORAL_TABLET | ORAL | 0 refills | Status: DC

## 2018-09-14 MED ORDER — AZITHROMYCIN 250 MG PO TABS: ORAL_TABLET | ORAL | 0 refills | Status: DC

## 2018-09-14 NOTE — Patient Instructions (Addendum)
Continue Breo Ellipta 200 >>> Take 1 puff daily in the morning right when you wake up >>>Rinse your mouth out after use >>>This is a daily maintenance inhaler, NOT a rescue inhaler >>>Contact our office if you are having difficulties affording or obtaining this medication >>>It is important for you to be able to take this daily and not miss any doses  Azithromycin 250mg  tablet  >>>Take 2 tablets (500mg  total) today, and then 1 tablet (250mg ) for the next four days  >>>take with food  >>>can also take probiotic and / or yogurt while on antibiotic   Prednisone 10mg  tablet  >>>4 tabs for 2 days, then 3 tabs for 2 days, 2 tabs for 2 days, then 1 tab for 2 days, then stop >>>take with food  >>>take in the morning   Can use DuoNeb nebulized medication or rescue inhaler every 6 hours as needed for shortness of breath or wheezing  Follow up in 6-8 weeks with Dr. Elsworth Soho or first available   It is flu season:   >>>Remember to be washing your hands regularly, using hand sanitizer, be careful to use around herself with has contact with people who are sick will increase her chances of getting sick yourself. >>> Best ways to protect herself from the flu: Receive the yearly flu vaccine, practice good hand hygiene washing with soap and also using hand sanitizer when available, eat a nutritious meals, get adequate rest, hydrate appropriately   Please contact the office if your symptoms worsen or you have concerns that you are not improving.   Thank you for choosing [facility] for your healthcare, and for allowing [place] to partner with you on your healthcare journey. I am thankful to be able to provide care to you today.   Wyn Quaker FNP-C

## 2018-09-14 NOTE — Assessment & Plan Note (Signed)
Continue to not smoke 

## 2018-09-14 NOTE — Assessment & Plan Note (Signed)
Continue Breo Ellipta 200 >>> Take 1 puff daily in the morning right when you wake up >>>Rinse your mouth out after use >>>This is a daily maintenance inhaler, NOT a rescue inhaler >>>Contact our office if you are having difficulties affording or obtaining this medication >>>It is important for you to be able to take this daily and not miss any doses  Azithromycin 250mg  tablet  >>>Take 2 tablets (500mg  total) today, and then 1 tablet (250mg ) for the next four days  >>>take with food  >>>can also take probiotic and / or yogurt while on antibiotic   Prednisone 10mg  tablet  >>>4 tabs for 2 days, then 3 tabs for 2 days, 2 tabs for 2 days, then 1 tab for 2 days, then stop >>>take with food  >>>take in the morning   Can use DuoNeb nebulized medication or rescue inhaler every 6 hours as needed for shortness of breath or wheezing  Follow up in 6-8 weeks with Dr. Elsworth Soho or first available

## 2018-09-14 NOTE — Progress Notes (Signed)
@Patient  ID: [name], female    DOB: 27-Oct-1955, 62 y.o.   MRN: [id number]  Chief Complaint  Patient presents with  . Follow-up    COPD    Referring provider: Biagio Borg, MD  HPI:  62 year old female former smoker followed in our office for chronic GOLD COPD III (based off of office spirometry in May/2018)  PMH: GERD Smoker/ Smoking History: Former smoker.  Quit 2019.  20-pack-year. Maintenance: Breo Ellipta 200 Pt of: [name]  09/14/2018  - Visit   62 year old female patient presenting to office today for an acute visit.  Patient reports that she is had 1 to 2 weeks of increased shortness of breath, increased wheezing, increased sputum production.  Sputum color is clear and foamy.  Patient reports cough.  Patient denies recent prednisone use.  Patient was treated with penicillin about 3 weeks ago due to a dental appointment.  Patient continues to be adherent to Brio Ellipta 200.  Patient reports she has to use her rescue inhaler 2-3 times a day with daily nebulizer use.  MMRC - Breathlessness Score 2 - on level ground, I walk slower than people of the same age because of breathlessness, or have to stop for breathe when walking to my own pace    Tests:  03/07/2017-spirometry- severe airway obstruction FEV1 42  Imaging:   01/11/18-chest x-ray-improved cardiomegaly and pulmonary vascular congestion, stable mild changes of COPD 12/09/2016-CT Angio- no radiographic evidence of pulmonary embolism   Cardiac:  02/09/2017-echocardiogram LV ejection fraction 60 to 72%, grade 2 diastolic dysfunction  FENO:  No results found for: NITRICOXIDE  PFT: No flowsheet data found.  Imaging: No results found.  Chart Review:    Specialty Problems      Pulmonary Problems   COPD (chronic obstructive pulmonary disease) with chronic bronchitis (HCC)   Allergic rhinitis   5 mm Lung nodule, solitary   Nocturnal hypoxemia   Cough   Sinusitis, acute   COPD exacerbation (HCC)     Acute upper respiratory infection      Allergies  Allergen Reactions  . Augmentin [Amoxicillin-Pot Clavulanate] Nausea And Vomiting    Immunization History  Administered Date(s) Administered  . Influenza Split 07/08/2013  . Influenza,inj,Quad PF,6+ Mos 07/31/2015, 09/03/2017  . Pneumococcal Polysaccharide-23 02/02/2014    Past Medical History:  Diagnosis Date  . Bronchitis   . CAD (coronary artery disease)    a. 02/2002: CABG x3 with LIMA to LAD, RIMA to RCA, and SVG to OM  . CHF (congestive heart failure) ([place])   . COPD (chronic obstructive pulmonary disease) ([place])   . Headache   . Heart disease   . Hyperlipidemia   . Hypertension   . Impaired glucose tolerance 08/20/2014  . Sinusitis     Tobacco History: Social History   Tobacco Use  Smoking Status Former Smoker  . Packs/day: 0.50  . Years: 40.00  . Pack years: 20.00  . Types: Cigarettes  . Last attempt to quit: 01/19/2018  . Years since quitting: 0.6  Smokeless Tobacco Never Used   Counseling given: Yes  Continue to not smoke   Outpatient Encounter Medications as of 09/14/2018  Medication Sig  . acetaminophen (TYLENOL) 500 MG tablet Take 1,000 mg by mouth at bedtime as needed for headache.  . albuterol (PROVENTIL HFA) 108 (90 Base) MCG/ACT inhaler INHALE 1-2 PUFFS INTO THE LUNGS EVERY 6 (SIX) HOURS AS NEEDED FOR WHEEZING OR SHORTNESS OF BREATH.  Marland Kitchen amLODipine (NORVASC) 5 MG tablet TAKE 1 TABLET  BY MOUTH EVERY DAY  . BREO ELLIPTA 200-25 MCG/INH AEPB TAKE 1 PUFF BY MOUTH EVERY DAY  . carvedilol (COREG) 12.5 MG tablet TAKE 1 TABLET (12.5 MG TOTAL) BY MOUTH 2 (TWO) TIMES DAILY.  . cholecalciferol (VITAMIN D) 1000 units tablet Take 1,000 Units by mouth daily.  . furosemide (LASIX) 40 MG tablet Take 40 mg by mouth daily.  Marland Kitchen ipratropium-albuterol (DUONEB) 0.5-2.5 (3) MG/3ML SOLN TAKE 3 MLS BY NEBULIZATION EVERY 6 (SIX) HOURS AS NEEDED.  Marland Kitchen isosorbide mononitrate (IMDUR) 60 MG 24 hr tablet TAKE 1 & 1/2 TABLET BY  MOUTH DAILY  . KLOR-CON M10 10 MEQ tablet TAKE 1 TABLET BY MOUTH EVERY DAY  . rosuvastatin (CRESTOR) 20 MG tablet Take 1 tablet (20 mg total) by mouth daily.  . vitamin B-12 (CYANOCOBALAMIN) 1000 MCG tablet Take 1,000 mcg by mouth daily.  . vitamin C (ASCORBIC ACID) 500 MG tablet Take 1,000 mg by mouth daily.  Marland Kitchen azithromycin (ZITHROMAX) 250 MG tablet 500mg  (two tablets) today, then 250mg  (1 tablet) for the next 4 days  . predniSONE (DELTASONE) 10 MG tablet 4 tabs for 2 days, then 3 tabs for 2 days, 2 tabs for 2 days, then 1 tab for 2 days, then stop   No facility-administered encounter medications on file as of 09/14/2018.      Review of Systems  Review of Systems  Constitutional: Positive for fatigue. Negative for chills, fever and unexpected weight change.  HENT: Positive for congestion. Negative for ear pain, postnasal drip, sinus pressure and sinus pain.   Respiratory: Positive for cough (white sudsy color, thick ), shortness of breath and wheezing. Negative for chest tightness.   Cardiovascular: Negative for chest pain and palpitations.  Gastrointestinal: Positive for nausea. Negative for diarrhea and vomiting.  Musculoskeletal: Negative for arthralgias.  Skin: Negative for color change.  Allergic/Immunologic: Negative for environmental allergies and food allergies.  Neurological: Negative for dizziness, light-headedness and headaches.  Psychiatric/Behavioral: Negative for dysphoric mood. The patient is not nervous/anxious.   All other systems reviewed and are negative.    Physical Exam  BP 130/90 (BP Location: Left Arm, Cuff Size: Normal)   Pulse 66   Ht 5\' 1"  (1.549 m)   Wt 176 lb (79.8 kg)   SpO2 93%   BMI 33.25 kg/m   Wt Readings from Last 5 Encounters:  09/14/18 176 lb (79.8 kg)  08/13/18 171 lb (77.6 kg)  07/31/18 171 lb 6.4 oz (77.7 kg)  07/30/18 172 lb (78 kg)  04/15/18 173 lb 3.2 oz (78.6 kg)     Physical Exam  Constitutional: She is oriented to person,  place, and time and well-developed, well-nourished, and in no distress. No distress.  HENT:  Head: Normocephalic and atraumatic.  Right Ear: Hearing, tympanic membrane, external ear and ear canal normal.  Left Ear: Hearing, tympanic membrane, external ear and ear canal normal.  Nose: Mucosal edema present. Right sinus exhibits no maxillary sinus tenderness and no frontal sinus tenderness. Left sinus exhibits no maxillary sinus tenderness and no frontal sinus tenderness.  Mouth/Throat: Uvula is midline and oropharynx is clear and moist. No oropharyngeal exudate.  Eyes: Pupils are equal, round, and reactive to light.  Neck: Normal range of motion. Neck supple.  Cardiovascular: Normal rate, regular rhythm and normal heart sounds.  Pulmonary/Chest: Effort normal. No accessory muscle usage. No respiratory distress. She has no decreased breath sounds. She has wheezes (exp wheeze ). She has no rhonchi.  Abdominal: Soft. Bowel sounds are normal. There is no  tenderness.  Musculoskeletal: Normal range of motion. She exhibits no edema.  Lymphadenopathy:    She has no cervical adenopathy.  Neurological: She is alert and oriented to person, place, and time. Gait normal.  Skin: Skin is warm and dry. She is not diaphoretic. No erythema.  Psychiatric: Mood, memory and judgment normal. She has a flat affect.  Nursing note and vitals reviewed.     Lab Results:  CBC    Component Value Date/Time   WBC 6.6 07/30/2018 1049   RBC 5.95 (H) 07/30/2018 1049   HGB 16.7 (H) 07/30/2018 1049   HGB 15.7 06/12/2014 1543   HCT 53.4 (H) 07/30/2018 1049   HCT 48.3 (H) 06/12/2014 1543   PLT 171 07/30/2018 1049   PLT 178 06/12/2014 1543   MCV 89.7 07/30/2018 1049   MCV 91 06/12/2014 1543   MCH 28.1 07/30/2018 1049   MCHC 31.3 07/30/2018 1049   RDW 13.2 07/30/2018 1049   RDW 14.8 (H) 06/12/2014 1543   LYMPHSABS 3.4 03/02/2018 1326   MONOABS 0.7 03/02/2018 1326   EOSABS 0.3 03/02/2018 1326   BASOSABS 0.1  03/02/2018 1326    BMET    Component Value Date/Time   NA 142 07/30/2018 1049   NA 142 06/12/2014 1543   K 3.9 07/30/2018 1049   K 3.6 06/12/2014 1543   CL 103 07/30/2018 1049   CL 105 06/12/2014 1543   CO2 31 07/30/2018 1049   CO2 29 06/12/2014 1543   GLUCOSE 128 (H) 07/30/2018 1049   GLUCOSE 113 (H) 06/12/2014 1543   BUN 8 07/30/2018 1049   BUN 12 06/12/2014 1543   CREATININE 0.51 07/30/2018 1049   CREATININE 0.62 06/12/2014 1543   CALCIUM 9.2 07/30/2018 1049   CALCIUM 8.9 06/12/2014 1543   GFRNONAA >60 07/30/2018 1049   GFRNONAA >60 06/12/2014 1543   GFRAA >60 07/30/2018 1049   GFRAA >60 06/12/2014 1543    BNP    Component Value Date/Time   BNP 70.0 01/11/2018 0821    ProBNP    Component Value Date/Time   PROBNP 485.2 (H) 02/01/2014 1300      Assessment & Plan:   62 year old female patient completing acute visit with [place] today.  Will treat as COPD exacerbation.  Will treat with Z-Pak as well as prednisone taper.  Patient to reschedule follow-up with [name].  Could consider pulmonary function test in the future to further evaluate COPD as patient has just had office spirometry.  COPD (chronic obstructive pulmonary disease) with chronic bronchitis (HCC) Continue Breo Ellipta 200 >>> Take 1 puff daily in the morning right when you wake up >>>Rinse your mouth out after use >>>This is a daily maintenance inhaler, NOT a rescue inhaler >>>Contact our office if you are having difficulties affording or obtaining this medication >>>It is important for you to be able to take this daily and not miss any doses  Azithromycin 250mg  tablet  >>>Take 2 tablets (500mg  total) today, and then 1 tablet (250mg ) for the next four days  >>>take with food  >>>can also take probiotic and / or yogurt while on antibiotic   Prednisone 10mg  tablet  >>>4 tabs for 2 days, then 3 tabs for 2 days, 2 tabs for 2 days, then 1 tab for 2 days, then stop >>>take with food  >>>take in the  morning   Can use DuoNeb nebulized medication or rescue inhaler every 6 hours as needed for shortness of breath or wheezing  Follow up in 6-8 weeks with Dr.  Alva or first available    COPD exacerbation (HCC) Azithromycin 250mg  tablet  >>>Take 2 tablets (500mg  total) today, and then 1 tablet (250mg ) for the next four days  >>>take with food  >>>can also take probiotic and / or yogurt while on antibiotic   Prednisone 10mg  tablet  >>>4 tabs for 2 days, then 3 tabs for 2 days, 2 tabs for 2 days, then 1 tab for 2 days, then stop >>>take with food  >>>take in the morning   Follow up in 6-8 weeks with [name] or first available    Tobacco abuse Continue to not smoke   This appointment was 28 minutes along with over 50% that time in direct face-to-face patient care, assessment, plan of care follow-up.  Lauraine Rinne, NP 09/14/2018

## 2018-09-14 NOTE — Assessment & Plan Note (Signed)
Azithromycin 250mg  tablet  >>>Take 2 tablets (500mg  total) today, and then 1 tablet (250mg ) for the next four days  >>>take with food  >>>can also take probiotic and / or yogurt while on antibiotic   Prednisone 10mg  tablet  >>>4 tabs for 2 days, then 3 tabs for 2 days, 2 tabs for 2 days, then 1 tab for 2 days, then stop >>>take with food  >>>take in the morning   Follow up in 6-8 weeks with Dr. Elsworth Soho or first available

## 2018-09-15 ENCOUNTER — Ambulatory Visit: Payer: 59 | Admitting: Pulmonary Disease

## 2018-10-02 ENCOUNTER — Other Ambulatory Visit: Payer: Self-pay | Admitting: Cardiology

## 2018-10-18 ENCOUNTER — Other Ambulatory Visit: Payer: Self-pay | Admitting: Cardiology

## 2018-11-02 ENCOUNTER — Ambulatory Visit: Payer: No Typology Code available for payment source | Admitting: Pulmonary Disease

## 2018-11-02 ENCOUNTER — Encounter: Payer: Self-pay | Admitting: Pulmonary Disease

## 2018-11-02 DIAGNOSIS — J441 Chronic obstructive pulmonary disease with (acute) exacerbation: Secondary | ICD-10-CM

## 2018-11-02 DIAGNOSIS — G4734 Idiopathic sleep related nonobstructive alveolar hypoventilation: Secondary | ICD-10-CM

## 2018-11-02 MED ORDER — FLUTICASONE-UMECLIDIN-VILANT 100-62.5-25 MCG/INH IN AEPB: 1.0000 | INHALATION_SPRAY | Freq: Every day | RESPIRATORY_TRACT | 0 refills | Status: DC

## 2018-11-02 NOTE — Assessment & Plan Note (Signed)
Continue nocturnal O2

## 2018-11-02 NOTE — Assessment & Plan Note (Signed)
Sample of trelegy-call me for prescription if this works  Nystatin 5 mL swish and swallow twice daily for 1 week  Okay to take albuterol MDI 2 puffs at 5 PM if you feel short of breath  We discussed signs and symptoms of exacerbation, call me if this occurs

## 2018-11-02 NOTE — Patient Instructions (Signed)
Sample of trelegy-call me for prescription if this works  Nystatin 5 mL swish and swallow twice daily for 1 week  Okay to take albuterol MDI 2 puffs at 5 PM if you feel short of breath  We discussed signs and symptoms of exacerbation, call me if this occurs

## 2018-11-02 NOTE — Progress Notes (Signed)
   Subjective:    Patient ID: [name], female    DOB: [birth year], 63 y.o.   MRN: [id number]  HPI  63 yo smoker for FU of COPD Last exacerbation was 01/2017 requiring hospitalization, discharged on 02 She works in the [employer].  She finally quit smoking February 2019 and is doing well with this. Unfortunately she had chest colds on 2 occasions 06/09/2018 and 09/09/2018 and required prednisone and an antibiotic.  She used to be on [place] and is now on [place].  She complains of soreness in her tongue. Morning is her worst time, she takes a nap every morning, felt short of breath again around 5 PM She has stopped using oxygen in the daytime, continues to use nocturnal oxygen     Significant tests/ events reviewed  Spirometry 02/2017 >> ratio of 59, FEV1 of 42% FVC of 56% ABG 01/2014 was 7.29/66/75/92% CT angiogram 11/2016 was negative for pulmonary embolism  08/2014 >>PSG neg for OSA , Desaturation as low as 85%   Past Medical History:  Diagnosis Date  . Bronchitis   . CAD (coronary artery disease)    a. 02/2002: CABG x3 with LIMA to LAD, Rhonda to RCA, and SVG to OM  . CHF (congestive heart failure) ([place])   . COPD (chronic obstructive pulmonary disease) ([place])   . Headache   . Heart disease   . Hyperlipidemia   . Hypertension   . Impaired glucose tolerance 08/20/2014  . Sinusitis      Review of Systems neg for any significant sore throat, dysphagia, itching, sneezing, nasal congestion or excess/ purulent secretions, fever, chills, sweats, unintended wt loss, pleuritic or exertional cp, hempoptysis, orthopnea pnd or change in chronic leg swelling. Also denies presyncope, palpitations, heartburn, abdominal pain, nausea, vomiting, diarrhea or change in bowel or urinary habits, dysuria,hematuria, rash, arthralgias, visual complaints, headache, numbness weakness or ataxia.     Objective:   Physical Exam   Gen. Pleasant, well-nourished, in no  distress ENT - no thrush, red pharynx, no pallor/icterus,no post nasal drip Neck: No JVD, no thyromegaly, no carotid bruits Lungs: no use of accessory muscles, no dullness to percussion, clear without rales or rhonchi  Cardiovascular: Rhythm regular, heart sounds  normal, no murmurs or gallops, no peripheral edema Musculoskeletal: No deformities, no cyanosis or clubbing         Assessment & Plan:

## 2018-11-02 NOTE — Addendum Note (Signed)
Addended by: Valerie Salts on: 11/02/2018 04:36 PM   Modules accepted: Orders

## 2018-11-09 ENCOUNTER — Encounter: Payer: Self-pay | Admitting: *Deleted

## 2018-11-09 ENCOUNTER — Ambulatory Visit (INDEPENDENT_AMBULATORY_CARE_PROVIDER_SITE_OTHER)
Admission: RE | Admit: 2018-11-09 | Discharge: 2018-11-09 | Disposition: A | Discharge status: Home / Self Care | Payer: No Typology Code available for payment source | Source: Ambulatory Visit | Attending: Primary Care | Admitting: Primary Care

## 2018-11-09 ENCOUNTER — Ambulatory Visit (INDEPENDENT_AMBULATORY_CARE_PROVIDER_SITE_OTHER): Payer: No Typology Code available for payment source | Admitting: Primary Care

## 2018-11-09 ENCOUNTER — Telehealth: Payer: Self-pay | Admitting: Pulmonary Disease

## 2018-11-09 ENCOUNTER — Encounter: Payer: Self-pay | Admitting: Primary Care

## 2018-11-09 VITALS — BP 106/64 | HR 87 | Temp 98.7°F | Ht 61.0 in | Wt 173.0 lb

## 2018-11-09 DIAGNOSIS — J441 Chronic obstructive pulmonary disease with (acute) exacerbation: Secondary | ICD-10-CM

## 2018-11-09 MED ORDER — AZITHROMYCIN 250 MG PO TABS: ORAL_TABLET | ORAL | 0 refills | Status: DC

## 2018-11-09 MED ORDER — PREDNISONE 10 MG PO TABS: ORAL_TABLET | ORAL | 0 refills | Status: DC

## 2018-11-09 NOTE — Addendum Note (Signed)
Addended by: Martyn Ehrich on: 11/09/2018 11:14 AM   Modules accepted: Orders

## 2018-11-09 NOTE — Telephone Encounter (Signed)
Spoke with the pt  Confirmed that she is afebrile  Letter created and in mychart for pt per her request Nothing further needed

## 2018-11-09 NOTE — Patient Instructions (Addendum)
Seen today for COPD exacerbation/bronchitis   Recommendations: Mucinex twice daily with full glass of water Continue Trelegy 1 puff every day  Albuterol hfa every 4-6 hours as needed for shortness of breath or wheezing    Orders: CXR today (ordered)  RX: Prednisone taper as prescribed (sent) Waiting on results of CXR before sending in antibiotic   Follow up: If symptoms do not improve or worsen in any way     Chronic Obstructive Pulmonary Disease Exacerbation Chronic obstructive pulmonary disease (COPD) is a long-term (chronic) lung problem. In COPD, the flow of air from the lungs is limited. COPD exacerbations are times that breathing gets worse and you need more than your normal treatment. Without treatment, they can be life threatening. If they happen often, your lungs can become more damaged. If your COPD gets worse, your doctor may treat you with:  Medicines.  Oxygen.  Different ways to clear your airway, such as using a mask. Follow these instructions at home: Medicines  Take over-the-counter and prescription medicines only as told by your doctor.  If you take an antibiotic or steroid medicine, do not stop taking the medicine even if you start to feel better.  Keep up with shots (vaccinations) as told by your doctor. Be sure to get a yearly (annual) flu shot. Lifestyle  Do not smoke. If you need help quitting, ask your doctor.  Eat healthy foods.  Exercise regularly.  Get plenty of sleep.  Avoid tobacco smoke and other things that can bother your lungs.  Wash your hands often with soap and water. This will help keep you from getting an infection. If you cannot use soap and water, use hand sanitizer.  During flu season, avoid areas that are crowded with people. General instructions  Drink enough fluid to keep your pee (urine) clear or pale yellow. Do not do this if your doctor has told you not to.  Use a cool mist machine (vaporizer).  If you use oxygen or  a machine that turns medicine into a mist (nebulizer), continue to use it as told.  Follow all instructions for rehabilitation. These are steps you can take to make your body work better.  Keep all follow-up visits as told by your doctor. This is important. Contact a doctor if:  Your COPD symptoms get worse than normal. Get help right away if:  You are short of breath and it gets worse.  You have trouble talking.  You have chest pain.  You cough up blood.  You have a fever.  You keep throwing up (vomiting).  You feel weak or you pass out (faint).  You feel confused.  You are not able to sleep because of your symptoms.  You are not able to do daily activities. Summary  COPD exacerbations are times that breathing gets worse and you need more treatment than normal.  COPD exacerbations can be very serious and may cause your lungs to become more damaged.  Do not smoke. If you need help quitting, ask your doctor.  Stay up-to-date on your shots. Get a flu shot every year. This information is not intended to replace advice given to you by your health care provider. Make sure you discuss any questions you have with your health care provider. Document Released: 09/26/2011 Document Revised: 11/11/2016 Document Reviewed: 11/11/2016 Elsevier Interactive Patient Education  2019 [employer].

## 2018-11-09 NOTE — Telephone Encounter (Signed)
Spoke with pt. States that she is not feeling well. Reports chest tightness, coughing and chills. Cough is producing green mucus. Pt has been scheduled to see Beth this morning at 10am. Nothing further was needed.

## 2018-11-09 NOTE — Progress Notes (Signed)
@Patient  ID: [name], female    DOB: Jul 30, 1956, 63 y.o.   MRN: [id number]  Chief Complaint  Patient presents with  . Acute Visit    Coughing up green mucus, sob throwing up the mucus     Referring provider: Biagio Borg, MD  HPI: 63 year old female, former smoker. PMH significant for COPD, allergic rhinitis, sinusitis, nocturnal hypoxemia. She has stopped using oxygen in daytime, continues to use nocturnal oxygen. Patient of Dr. Elsworth Soho, last seen on 11/02/18. Breo changed to Trelegy.   11/09/2018 Patient presents today for acute visit with complaints of cough with green sputum and increased shortness of breath. Cough is productive in the morning, described as thick grey and green color. Humidification from shower help loose mucus. She has been having to use nebulizer machine three times a day. Wears oxygen at night, needed to use oxygen 3-4 days ago at work. She has noticed some improvement with Trelegy, states that it has provided her with longer relief than previous BREO. Afebrile.   Significant test Joya San reviewed: 02/2017 Spirometry>> ratio of 59, FEV1 42%, FVC 56% 11/2016 CT angiogram - negative for PE 08/2014 >> PSG neg for OSA, desaturation low 85%  Allergies  Allergen Reactions  . Augmentin [Amoxicillin-Pot Clavulanate] Nausea And Vomiting    Immunization History  Administered Date(s) Administered  . Influenza Split 07/08/2013  . Influenza,inj,Quad PF,6+ Mos 07/31/2015, 09/03/2017  . Pneumococcal Polysaccharide-23 02/02/2014    Past Medical History:  Diagnosis Date  . Bronchitis   . CAD (coronary artery disease)    a. 02/2002: CABG x3 with LIMA to LAD, RIMA to RCA, and SVG to OM  . CHF (congestive heart failure) ([place])   . COPD (chronic obstructive pulmonary disease) ([place])   . Headache   . Heart disease   . Hyperlipidemia   . Hypertension   . Impaired glucose tolerance 08/20/2014  . Sinusitis     Tobacco History: Social History   Tobacco Use  Smoking  Status Former Smoker  . Packs/day: 0.50  . Years: 40.00  . Pack years: 20.00  . Types: Cigarettes  . Last attempt to quit: 01/19/2018  . Years since quitting: 0.8  Smokeless Tobacco Never Used   Counseling given: Not Answered   Outpatient Medications Prior to Visit  Medication Sig Dispense Refill  . acetaminophen (TYLENOL) 500 MG tablet Take 1,000 mg by mouth at bedtime as needed for headache.    . albuterol (PROVENTIL HFA) 108 (90 Base) MCG/ACT inhaler INHALE 1-2 PUFFS INTO THE LUNGS EVERY 6 (SIX) HOURS AS NEEDED FOR WHEEZING OR SHORTNESS OF BREATH. 6.7 Inhaler 5  . amLODipine (NORVASC) 5 MG tablet TAKE 1 TABLET BY MOUTH EVERY DAY 30 tablet 7  . carvedilol (COREG) 12.5 MG tablet TAKE 1 TABLET (12.5 MG TOTAL) BY MOUTH 2 (TWO) TIMES DAILY. 60 tablet 9  . cholecalciferol (VITAMIN D) 1000 units tablet Take 1,000 Units by mouth daily.    . Fluticasone-Umeclidin-Vilant (TRELEGY ELLIPTA) 100-62.5-25 MCG/INH AEPB Inhale 1 puff into the lungs daily. 1 each 0  . furosemide (LASIX) 40 MG tablet Take 40 mg by mouth daily.    Marland Kitchen ipratropium-albuterol (DUONEB) 0.5-2.5 (3) MG/3ML SOLN TAKE 3 MLS BY NEBULIZATION EVERY 6 (SIX) HOURS AS NEEDED. 360 mL 5  . isosorbide mononitrate (IMDUR) 60 MG 24 hr tablet TAKE 1 & 1/2 TABLET BY MOUTH DAILY 45 tablet 7  . KLOR-CON M10 10 MEQ tablet TAKE 1 TABLET BY MOUTH EVERY DAY 30 tablet 2  . rosuvastatin (  CRESTOR) 20 MG tablet Take 1 tablet (20 mg total) by mouth daily. 30 tablet 11  . vitamin B-12 (CYANOCOBALAMIN) 1000 MCG tablet Take 1,000 mcg by mouth daily.    . vitamin C (ASCORBIC ACID) 500 MG tablet Take 1,000 mg by mouth daily.    Marland Kitchen BREO ELLIPTA 200-25 MCG/INH AEPB TAKE 1 PUFF BY MOUTH EVERY DAY (Patient not taking: Reported on 11/09/2018) 60 each 3   No facility-administered medications prior to visit.     Review of Systems  Review of Systems  Constitutional: Positive for chills. Negative for fever.  HENT: Positive for ear pain.   Respiratory: Positive for  cough and shortness of breath.   Cardiovascular: Negative.     Physical Exam  BP 106/64 (BP Location: Left Arm, Patient Position: Sitting, Cuff Size: Normal)   Pulse 87   Temp 98.7 F (37.1 C) (Oral)   Ht 5\' 1"  (1.549 m)   Wt 173 lb (78.5 kg)   SpO2 93%   BMI 32.69 kg/m  Physical Exam Constitutional:      General: She is not in acute distress.    Appearance: She is ill-appearing.  HENT:     Head: Normocephalic and atraumatic.     Right Ear: Hearing normal. There is impacted cerumen. Tympanic membrane is erythematous. Tympanic membrane is not retracted or bulging.     Left Ear: Hearing normal.     Ears:     Comments: Partial impacted cerumen right ear canal, TM with mild-moderate erythema     Mouth/Throat:     Mouth: Mucous membranes are moist.     Pharynx: Oropharynx is clear.  Eyes:     Pupils: Pupils are equal, round, and reactive to light.     Comments: Injected   Cardiovascular:     Rate and Rhythm: Normal rate and regular rhythm.  Pulmonary:     Effort: Pulmonary effort is normal. No respiratory distress.     Breath sounds: No wheezing or rhonchi.     Comments: Mostly CTA Neurological:     Mental Status: She is alert.      Lab Results:  CBC    Component Value Date/Time   WBC 6.6 07/30/2018 1049   RBC 5.95 (H) 07/30/2018 1049   HGB 16.7 (H) 07/30/2018 1049   HGB 15.7 06/12/2014 1543   HCT 53.4 (H) 07/30/2018 1049   HCT 48.3 (H) 06/12/2014 1543   PLT 171 07/30/2018 1049   PLT 178 06/12/2014 1543   MCV 89.7 07/30/2018 1049   MCV 91 06/12/2014 1543   MCH 28.1 07/30/2018 1049   MCHC 31.3 07/30/2018 1049   RDW 13.2 07/30/2018 1049   RDW 14.8 (H) 06/12/2014 1543   LYMPHSABS 3.4 03/02/2018 1326   MONOABS 0.7 03/02/2018 1326   EOSABS 0.3 03/02/2018 1326   BASOSABS 0.1 03/02/2018 1326    BMET    Component Value Date/Time   NA 142 07/30/2018 1049   NA 142 06/12/2014 1543   K 3.9 07/30/2018 1049   K 3.6 06/12/2014 1543   CL 103 07/30/2018 1049   CL  105 06/12/2014 1543   CO2 31 07/30/2018 1049   CO2 29 06/12/2014 1543   GLUCOSE 128 (H) 07/30/2018 1049   GLUCOSE 113 (H) 06/12/2014 1543   BUN 8 07/30/2018 1049   BUN 12 06/12/2014 1543   CREATININE 0.51 07/30/2018 1049   CREATININE 0.62 06/12/2014 1543   CALCIUM 9.2 07/30/2018 1049   CALCIUM 8.9 06/12/2014 1543   GFRNONAA >60 07/30/2018 1049  GFRNONAA >60 06/12/2014 1543   GFRAA >60 07/30/2018 1049   GFRAA >60 06/12/2014 1543    BNP    Component Value Date/Time   BNP 70.0 01/11/2018 0821    ProBNP    Component Value Date/Time   PROBNP 485.2 (H) 02/01/2014 1300    Imaging: No results found.   Assessment & Plan:   COPD exacerbation ([place]) - COPD exacerbation r/t bronchitis - CXR today - RX prednisone taper (40mg  x 2 days; 30mg  x 2 days; 20mg  x 2 days; 10mg  x 2 days) - Holding off on abx until CXR resulted - Continue Trelegy as prescribed, prn albuterol q 4-6 hours  - Advised mucinex twice daily  - Return if symptoms do not improve or worsen in any way    Martyn Ehrich, NP 11/09/2018

## 2018-11-09 NOTE — Telephone Encounter (Signed)
Spoke with the pt  She states that she is wanting to try to go back to work tomorrow and needs note stating this is ok  She states she forgot to ask for this while at [facility] with Beth today  Please advise, thanks

## 2018-11-09 NOTE — Telephone Encounter (Signed)
Thanks fine, as long as she is afebrile

## 2018-11-09 NOTE — Assessment & Plan Note (Signed)
-   COPD exacerbation r/t bronchitis - CXR today - RX prednisone taper (40mg  x 2 days; 30mg  x 2 days; 20mg  x 2 days; 10mg  x 2 days) - Holding off on abx until CXR resulted - Continue Trelegy as prescribed, prn albuterol q 4-6 hours  - Advised mucinex twice daily  - Return if symptoms do not improve or worsen in any way

## 2018-11-12 ENCOUNTER — Other Ambulatory Visit: Payer: Self-pay | Admitting: Acute Care

## 2018-11-12 ENCOUNTER — Other Ambulatory Visit: Payer: Self-pay

## 2018-11-12 MED ORDER — FLUTICASONE-UMECLIDIN-VILANT 100-62.5-25 MCG/INH IN AEPB: 1.0000 | INHALATION_SPRAY | Freq: Every day | RESPIRATORY_TRACT | 5 refills | Status: DC

## 2018-11-12 NOTE — Telephone Encounter (Signed)
Dr Elsworth Soho, please see the below email chain and advise recs thanks:   I feel much better now,I was wondering if the side affects from the Trelegy that made me sick. And if so should I continue using it or go back to the [facility]. ----- Message ----- From: CMA Doneta Public Sent: 1/23/20204:13 PM EST To: [name] Subject: RE: Visit Follow-Up Question So the cough is getting better? What are the symptoms you are having- the fatigue? Any other concerns to let the provider know about?   ----- Message -----  From: [name]  Sent: 1/23/20203:53 PM EST  To: [name]. Elsworth Soho, MD Subject: RE: Visit Follow-Up Question  The mucus was a grayish/green color.I've been on predisoneand antibotics for since Tuesday and that's getting better.The symtoms started a few days after I started using the trelegy. ----- Message ----- From: CMA Doneta Public Sent: 1/23/20203:43 PM EST To: [name] Subject: RE: Visit Follow-Up Question Binnie Kand,  When exactly did these symptoms start? What color is the mucus you are producing?   ----- Message -----  From: [name]  Sent: 1/23/20203:38 PM EST  To: [name]. Elsworth Soho, MD Subject: Visit Follow-Up Question  Dr. Elsworth Soho, on the 11th I had a check up and we switched me to trelegy.Monday the 20th I was back over there,tired feeling, change in mucus color, chills, increase cough and just feeling bad.Could these symptoms be side affectsof the trelegy?Should I continueusing it? Please advise.

## 2018-11-13 NOTE — Telephone Encounter (Signed)
Did symptoms improve while she was taking Trelegy or after she had stopped taking this? Unlikely to be related to Trelegy, but really her call if she wants to switch back to [facility]

## 2018-11-16 MED ORDER — FLUTICASONE FUROATE-VILANTEROL 200-25 MCG/INH IN AEPB: 1.0000 | INHALATION_SPRAY | Freq: Every day | RESPIRATORY_TRACT | 5 refills | Status: DC

## 2018-12-14 ENCOUNTER — Ambulatory Visit (INDEPENDENT_AMBULATORY_CARE_PROVIDER_SITE_OTHER): Payer: No Typology Code available for payment source | Admitting: Pulmonary Disease

## 2018-12-14 ENCOUNTER — Encounter: Payer: Self-pay | Admitting: Pulmonary Disease

## 2018-12-14 ENCOUNTER — Telehealth: Payer: Self-pay | Admitting: Pulmonary Disease

## 2018-12-14 VITALS — BP 148/60 | HR 75 | Temp 98.4°F | Ht 65.0 in | Wt 175.0 lb

## 2018-12-14 DIAGNOSIS — J9611 Chronic respiratory failure with hypoxia: Secondary | ICD-10-CM | POA: Insufficient documentation

## 2018-12-14 DIAGNOSIS — F17210 Nicotine dependence, cigarettes, uncomplicated: Secondary | ICD-10-CM

## 2018-12-14 DIAGNOSIS — J449 Chronic obstructive pulmonary disease, unspecified: Secondary | ICD-10-CM | POA: Diagnosis not present

## 2018-12-14 DIAGNOSIS — Z720 Tobacco use: Secondary | ICD-10-CM

## 2018-12-14 MED ORDER — AZITHROMYCIN 250 MG PO TABS: ORAL_TABLET | ORAL | 0 refills | Status: DC

## 2018-12-14 MED ORDER — PREDNISONE 10 MG PO TABS: ORAL_TABLET | ORAL | 0 refills | Status: DC

## 2018-12-14 MED ORDER — NICOTINE POLACRILEX 4 MG MT LOZG: 4.0000 mg | LOZENGE | OROMUCOSAL | 0 refills | Status: DC | PRN

## 2018-12-14 MED ORDER — IPRATROPIUM-ALBUTEROL 0.5-2.5 (3) MG/3ML IN SOLN
3.0000 mL | Freq: Once | RESPIRATORY_TRACT | Status: AC
  Administered 2018-12-14: 3 mL via RESPIRATORY_TRACT

## 2018-12-14 MED ORDER — FLUTICASONE-UMECLIDIN-VILANT 100-62.5-25 MCG/INH IN AEPB: 1.0000 | INHALATION_SPRAY | Freq: Every day | RESPIRATORY_TRACT | 0 refills | Status: DC

## 2018-12-14 MED ORDER — NICOTINE 21 MG/24HR TD PT24: 21.0000 mg | MEDICATED_PATCH | Freq: Every day | TRANSDERMAL | 0 refills | Status: DC

## 2018-12-14 NOTE — Patient Instructions (Addendum)
Duoneb today   Azithromycin 250mg  tablet  >>>Take 2 tablets (500mg  total) today, and then 1 tablet (250mg ) for the next four days  >>>take with food  >>>can also take probiotic and / or yogurt while on antibiotic   Prednisone 10mg  tablet  >>>4 tabs for 2 days, then 3 tabs for 2 days, 2 tabs for 2 days, then 1 tab for 2 days, then stop >>>take with food  >>>take in the morning   Restart Trelegy Ellipta  >>> 1 puff daily in the morning >>>rinse mouth out after use  >>> This inhaler contains 3 medications that help manage her respiratory status, contact our office if you cannot afford this medication or unable to remain on this medication >>>THIS REPLACES YOUR BREO   Can use Duo neb nebulized meds every 6 hours as needed   We recommend that you stop smoking.  >>>You need to set a quit date >>>If you have friends or family who smoke, let them know you are trying to quit and not to smoke around you or in your living environment  Smoking Cessation Resources:  1 [place]  >>> Patient to call this resource and utilize it to help support her quit smoking >>> Keep up your hard work with stopping smoking  You can also contact the [facility] >>>For smoking cessation classes call [phone]  We do not recommend using e-cigarettes as a form of stopping smoking  You can sign up for smoking cessation support texts and information:  >>>https://smokefree.gov/smokefreetxt  Nicotine patches: >>>Make sure you rotate sites that you do not get skin irritation, Apply 1 patch each morning to a non-hairy skin site  If you are smoking greater than 10 cigarettes/day and weigh over 45 kg start with the nicotine patch of 21 mg a day for 6 weeks, then 14 mg a day for 2 weeks, then finished with 7 mg a day for 2 weeks, then stop  >>>If insomnia occurs you are having trouble sleeping you can take the patch off at night, and place a new one on in the morning >>>If the patch is removed  at night and you have morning cravings start short acting nicotine replacement therapy such as gum or lozenges  >>>Avoid acidic beverages such as coffee, carbonated beverages before and during tablet use.  A soft acidic beverages lower oral pH which cause nicotine to not be absorbed properly >>>If you chew the gum too quickly or vigorously you could have nausea, vomiting, abdominal pain, constipation, hiccups, headache, sore jaw, mouth irritation ulcers  Nicotine lozenge: Lozenges are commonly uses short acting NRT product  >>>Smokers who smoke within 30 minutes of awakening should use 4 mg dose >>>Smokers who wait more than 30 minutes after awakening to smoke should use 2 mg dose  Can use up to 1 lozenge every 1-2 hours for 6 weeks >>>Total amount of lozenges that can be used per day as 20 >>>Gradually reduce number of lozenges used per day after 2 weeks of use  Place lozenge in mouth and allowed to dissolve for 30 minutes loss and does not need to be chewed  Lozenges have advantages to be able to be used in people with TMG, poor dentition, dentures   Follow up in 4 weeks with a pulmonary function test with Wyn Quaker FNP    It is flu season:   >>> Best ways to protect herself from the flu: Receive the yearly flu vaccine, practice good hand hygiene washing with soap  and also using hand sanitizer when available, eat a nutritious meals, get adequate rest, hydrate appropriately   Please contact the office if your symptoms worsen or you have concerns that you are not improving.   Thank you for choosing  [facility] for your healthcare, and for allowing [place] to partner with you on your healthcare journey. I am thankful to be able to provide care to you today.   Wyn Quaker FNP-C    Chronic Obstructive Pulmonary Disease Exacerbation Chronic obstructive pulmonary disease (COPD) is a long-term (chronic) lung problem. In COPD, the flow of air from the lungs is limited. COPD  exacerbations are times that breathing gets worse and you need more than your normal treatment. Without treatment, they can be life threatening. If they happen often, your lungs can become more damaged. If your COPD gets worse, your doctor may treat you with:  Medicines.  Oxygen.  Different ways to clear your airway, such as using a mask. Follow these instructions at home: Medicines  Take over-the-counter and prescription medicines only as told by your doctor.  If you take an antibiotic or steroid medicine, do not stop taking the medicine even if you start to feel better.  Keep up with shots (vaccinations) as told by your doctor. Be sure to get a yearly (annual) flu shot. Lifestyle  Do not smoke. If you need help quitting, ask your doctor.  Eat healthy foods.  Exercise regularly.  Get plenty of sleep.  Avoid tobacco smoke and other things that can bother your lungs.  Wash your hands often with soap and water. This will help keep you from getting an infection. If you cannot use soap and water, use hand sanitizer.  During flu season, avoid areas that are crowded with people. General instructions  Drink enough fluid to keep your pee (urine) clear or pale yellow. Do not do this if your doctor has told you not to.  Use a cool mist machine (vaporizer).  If you use oxygen or a machine that turns medicine into a mist (nebulizer), continue to use it as told.  Follow all instructions for rehabilitation. These are steps you can take to make your body work better.  Keep all follow-up visits as told by your doctor. This is important. Contact a doctor if:  Your COPD symptoms get worse than normal. Get help right away if:  You are short of breath and it gets worse.  You have trouble talking.  You have chest pain.  You cough up blood.  You have a fever.  You keep throwing up (vomiting).  You feel weak or you pass out (faint).  You feel confused.  You are not able to  sleep because of your symptoms.  You are not able to do daily activities. Summary  COPD exacerbations are times that breathing gets worse and you need more treatment than normal.  COPD exacerbations can be very serious and may cause your lungs to become more damaged.  Do not smoke. If you need help quitting, ask your doctor.  Stay up-to-date on your shots. Get a flu shot every year. This information is not intended to replace advice given to you by your health care provider. Make sure you discuss any questions you have with your health care provider. Document Released: 09/26/2011 Document Revised: 11/11/2016 Document Reviewed: 11/11/2016 Elsevier Interactive Patient Education  2019 [place] Risks of Smoking Smoking cigarettes is very bad for your health. Tobacco smoke has  over 200 known poisons in it. It contains the poisonous gases nitrogen oxide and carbon monoxide. There are over 60 chemicals in tobacco smoke that cause cancer. Smoking is difficult to quit because a chemical in tobacco, called nicotine, causes addiction or dependence. When you smoke and inhale, nicotine is absorbed rapidly into the bloodstream through your lungs. Both inhaled and non-inhaled nicotine may be addictive. What are the risks of cigarette smoke? Cigarette smokers have an increased risk of many serious medical problems, including:  Lung cancer.  Lung disease, such as pneumonia, bronchitis, and emphysema.  Chest pain (angina) and heart attack because the heart is not getting enough oxygen.  Heart disease and peripheral blood vessel disease.  High blood pressure (hypertension).  Stroke.  Oral cancer, including cancer of the lip, mouth, or voice box.  Bladder cancer.  Pancreatic cancer.  Cervical cancer.  Pregnancy complications, including premature birth.  Stillbirths and smaller newborn babies, birth defects, and genetic damage to sperm.  Early menopause.  Lower  estrogen level for women.  Infertility.  Facial wrinkles.  Blindness.  Increased risk of broken bones (fractures).  Senile dementia.  Stomach ulcers and internal bleeding.  Delayed wound healing and increased risk of complications during surgery.  Even smoking lightly shortens your life expectancy by several years. Because of secondhand smoke exposure, children of smokers have an increased risk of the following:  Sudden infant death syndrome (SIDS).  Respiratory infections.  Lung cancer.  Heart disease.  Ear infections. What are the benefits of quitting? There are many health benefits of quitting smoking. Here are some of them:  Within days of quitting smoking, your risk of having a heart attack decreases, your blood flow improves, and your lung capacity improves. Blood pressure, pulse rate, and breathing patterns start returning to normal soon after quitting.  Within months, your lungs may clear up completely.  Quitting for 10 years reduces your risk of developing lung cancer and heart disease to almost that of a nonsmoker.  People who quit may see an improvement in their overall quality of life. How do I quit smoking?     Smoking is an addiction with both physical and psychological effects, and longtime habits can be hard to change. Your health care provider can recommend:  Programs and community resources, which may include group support, education, or talk therapy.  Prescription medicines to help reduce cravings.  Nicotine replacement products, such as patches, gum, and nasal sprays. Use these products only as directed. Do not replace cigarette smoking with electronic cigarettes, which are commonly called e-cigarettes. The safety of e-cigarettes is not known, and some may contain harmful chemicals.  A combination of two or more of these methods. Where to find more information  American Lung Association: www.lung.org  American Cancer Society:  www.cancer.org Summary  Smoking cigarettes is very bad for your health. Cigarette smokers have an increased risk of many serious medical problems, including several cancers, heart disease, and stroke.  Smoking is an addiction with both physical and psychological effects, and longtime habits can be hard to change.  By stopping right away, you can greatly reduce the risk of medical problems for you and your family.  To help you quit smoking, your health care provider can recommend programs, community resources, prescription medicines, and nicotine replacement products such as patches, gum, and nasal sprays. This information is not intended to replace advice given to you by your health care provider. Make sure you discuss any questions you have with your health care  provider. Document Released: 11/14/2004 Document Revised: 01/08/2018 Document Reviewed: 10/11/2016 Elsevier Interactive Patient Education  2019 [employer].    Steps to Quit Smoking  Smoking tobacco can be bad for your health. It can also affect almost every organ in your body. Smoking puts you and people around you at risk for many serious long-lasting (chronic) diseases. Quitting smoking is hard, but it is one of the best things that you can do for your health. It is never too late to quit. What are the benefits of quitting smoking? When you quit smoking, you lower your risk for getting serious diseases and conditions. They can include:  Lung cancer or lung disease.  Heart disease.  Stroke.  Heart attack.  Not being able to have children (infertility).  Weak bones (osteoporosis) and broken bones (fractures). If you have coughing, wheezing, and shortness of breath, those symptoms may get better when you quit. You may also get sick less often. If you are pregnant, quitting smoking can help to lower your chances of having a baby of low birth weight. What can I do to help me quit smoking? Talk with your doctor about what  can help you quit smoking. Some things you can do (strategies) include:  Quitting smoking totally, instead of slowly cutting back how much you smoke over a period of time.  Going to in-person counseling. You are more likely to quit if you go to many counseling sessions.  Using resources and support systems, such as: ? Database administrator with a Social worker. ? Phone quitlines. ? Careers information officer. ? Support groups or group counseling. ? Text messaging programs. ? Mobile phone apps or applications.  Taking medicines. Some of these medicines may have nicotine in them. If you are pregnant or breastfeeding, do not take any medicines to quit smoking unless your doctor says it is okay. Talk with your doctor about counseling or other things that can help you. Talk with your doctor about using more than one strategy at the same time, such as taking medicines while you are also going to in-person counseling. This can help make quitting easier. What things can I do to make it easier to quit? Quitting smoking might feel very hard at first, but there is a lot that you can do to make it easier. Take these steps:  Talk to your family and friends. Ask them to support and encourage you.  Call phone quitlines, reach out to support groups, or work with a Social worker.  Ask people who smoke to not smoke around you.  Avoid places that make you want (trigger) to smoke, such as: ? Bars. ? Parties. ? Smoke-break areas at work.  Spend time with people who do not smoke.  Lower the stress in your life. Stress can make you want to smoke. Try these things to help your stress: ? Getting regular exercise. ? Deep-breathing exercises. ? Yoga. ? Meditating. ? Doing a body scan. To do this, close your eyes, focus on one area of your body at a time from head to toe, and notice which parts of your body are tense. Try to relax the muscles in those areas.  Download or buy apps on your mobile phone or tablet that can  help you stick to your quit plan. There are many free apps, such as QuitGuide from the [employer] Office manager for Disease Control and Prevention). You can find more support from smokefree.gov and other websites. This information is not intended to replace advice given to you by your  health care provider. Make sure you discuss any questions you have with your health care provider. Document Released: 08/03/2009 Document Revised: 06/04/2016 Document Reviewed: 02/21/2015 Elsevier Interactive Patient Education  2019 [place] Oxygen Use, Adult When a medical condition keeps you from getting enough oxygen, your health care provider may instruct you to take extra oxygen at home. Your health care provider will let you know:  When to take oxygen.  For how long to take oxygen.  How quickly oxygen should be delivered (flow rate), in liters per minute (LPM or L/M). Home oxygen can be given through:  A mask.  A nasal cannula. This is a device or tube that goes in the nostrils.  A transtracheal catheter. This is a small, flexible tube placed in the trachea.  A tracheostomy. This is a surgically made opening in the trachea. These devices are connected with tubing to an oxygen source, such as:  A tank. Tanks hold oxygen in gas form. They must be replaced when the oxygen is used up.  A liquid oxygen device. This holds oxygen in liquid form. It must be replaced when the oxygen is used up.  An oxygen concentrator machine. This filters oxygen in the room. It uses electricity, so you must have a backup cylinder of oxygen in case the power goes out. Supplies needed: To use oxygen, you will need:  A mask, nasal cannula, transtracheal catheter, or tracheostomy.  An oxygen tank, a liquid oxygen device, or an oxygen concentrator.  The tape that your health care provider recommends (optional). If you use a transtracheal catheter and your prescribed flow rate is 1 LPM or greater, you will also need a  humidifier. Risks and complications  Fire. This can happen if the oxygen is exposed to a heat source, flame, or spark.  Injury to skin. This can happen if liquid oxygen touches your skin.  Organ damage. This can happen if you get too little oxygen. How to use oxygen Your health care provider or a representative from your [place] will show you how to use your oxygen device. Follow her or his instructions. The instructions may look something like this: 1. Wash your hands. 2. If you use an oxygen concentrator, make sure it is plugged in. 3. Place one end of the tube into the port on the tank, device, or machine. 4. Place the mask over your nose and mouth. Or, place the nasal cannula and secure it with tape if instructed. If you use a tracheostomy or transtracheal catheter, connect it to the oxygen source as directed. 5. Make sure the liter-flow setting on the machine is at the level prescribed by your health care provider. 6. Turn on the machine or adjust the knob on the tank or device to the correct liter-flow setting. 7. When you are done, turn off and unplug the machine, or turn the knob to OFF. How to clean and care for the oxygen supplies Nasal cannula  Clean it with a warm, wet cloth daily or as needed.  Wash it with a liquid soap once a week.  Rinse it thoroughly once or twice a week.  Replace it every 2-4 weeks.  If you have an infection, such as a cold or pneumonia, change the cannula when you get better. Mask  Replace it every 2-4 weeks.  If you have an infection, such as a cold or pneumonia, change the mask when you get better. Humidifier bottle  Wash the bottle between each refill: ?  Wash it with soap and warm water. ? Rinse it thoroughly. ? Disinfect it and its top. ? Air-dry it.  Make sure it is dry before you refill it. Oxygen concentrator  Clean the air filter at least twice a week according to directions from your home medical equipment and  service company.  Wipe down the cabinet every day. To do this: ? Unplug the unit. ? Wipe down the cabinet with a damp cloth. ? Dry the cabinet. Other equipment  Change any extra tubing every 1-3 months.  Follow instructions from your health care provider about taking care of any other equipment. Safety tips Fire safety tips   Keep your oxygen and oxygen supplies at least 5 ft away from sources of heat, flames, and sparks at all times.  Do not allow smoking near your oxygen. Put up "no smoking" signs in your home. Avoid smoking areas when in public.  Do not use materials that can burn (are flammable) while you use oxygen.  When you go to a restaurant with portable oxygen, ask to be seated in the nonsmoking section.  Keep a Data processing manager close by. Let your fire department know that you have oxygen in your home.  Test your home smoke detectors regularly. Traveling  Secure your oxygen tank in the vehicle so that it does not move around. Follow instructions from your medical device company about how to safely secure your tank.  Make sure you have enough oxygen for the amount of time you will be away from home.  If you are planning air travel, contact the airline to find out if they allow the use of an approved portable oxygen concentrator. You may also need documents from your health care provider and medical device company before you travel. General safety tips  If you use an oxygen cylinder, make sure it is in a stand or secured to an object that will not move (fixed object).  If you use liquid oxygen, make sure its container is kept upright.  If you use an oxygen concentrator: ? Dance movement psychotherapist company. Make sure you are given priority service in the event that your power goes out. ? Avoid using extension cords, if possible. Follow these instructions at home:  Use oxygen only as told by your health care provider.  Do not use alcohol or other drugs that make you  relax (sedating drugs) unless instructed. They can slow down your breathing rate and make it hard to get in enough oxygen.  Know how and when to order a refill of oxygen.  Always keep a spare tank of oxygen. Plan ahead for holidays when you may not be able to get a prescription filled.  Use water-based lubricants on your lips or nostrils. Do not use oil-based products like petroleum jelly.  To prevent skin irritation on your cheeks or behind your ears, tuck some gauze under the tubing. Contact a health care provider if:  You get headaches often.  You have shortness of breath.  You have a lasting cough.  You have anxiety.  You are sleepy all the time.  You develop an illness that affects your breathing.  You cannot exercise at your regular level.  You are restless.  You have difficult or irregular breathing, and it is getting worse.  You have a fever.  You have persistent redness under your nose. Get help right away if:  You are confused.  You have blue lips or fingernails.  You are struggling to breathe. Summary  Your health care provider or a representative from your [place] will show you how to use your oxygen device. Follow her or his instructions.  If you use an oxygen concentrator, make sure it is plugged in.  Make sure the liter-flow setting on the machine is at the level prescribed by your health care provider.  Keep your oxygen and oxygen supplies at least 5 ft away from sources of heat, flames, and sparks at all times. This information is not intended to replace advice given to you by your health care provider. Make sure you discuss any questions you have with your health care provider. Document Released: 12/28/2003 Document Revised: 03/26/2018 Document Reviewed: 04/30/2016 Elsevier Interactive Patient Education  2019 [employer].

## 2018-12-14 NOTE — Progress Notes (Signed)
@Patient  ID: [name], female    DOB: [birth year], 63 y.o.   MRN: [id number]  Chief Complaint  Patient presents with  . Acute Visit    COPD     Referring provider: Biagio Borg, MD  HPI:  63 year old female former smoker followed in our office for chronic GOLD COPD III (based off of office spirometry in May/2018)  PMH: GERD Smoker/ Smoking History: Current Smoker. 7-10 cigarettes. 20-pack-year. Maintenance: Breo Ellipta 200 Pt of: Dr. Elsworth Soho  12/14/2018  - Visit   63 year old female current every day smoker (restarted smoking now smoking 7 to 10 cigarettes daily) presenting to our office today for 3 to 5 days of increased shortness of breath and increased oxygen needs.  Patient reports that she is currently needing 3 L which is a change from her baseline of 2 L.  Patient also is currently maintained on Breo Ellipta inhaler as she stopped taking her Trelegy Ellipta inhaler because she saw online how many side effects it has.  Patient reports that she has started to use her DuoNeb nebulized medications 2 times daily which has helped.  Patient wanted to present to our office today because she is having a productive cough with green purulent mucus over the last 4 days.  Patient wanted to be evaluated.  MMRC - Breathlessness Score 3 - I stop for breath after walking about 100 yards or after a few minutes on level ground (isle at grocery store is 136ft)   Tests:  03/07/2017-spirometry- severe airway obstruction FEV1 42  Imaging:   01/11/18-chest x-ray-improved cardiomegaly and pulmonary vascular congestion, stable mild changes of COPD 12/09/2016-CT Angio- no radiographic evidence of pulmonary embolism   Cardiac:  02/09/2017-echocardiogram LV ejection fraction 60 to 40%, grade 2 diastolic dysfunction  FENO:   No results found for: NITRICOXIDE  PFT: No flowsheet data found.  Imaging: No results found.    Specialty Problems      Pulmonary Problems   COPD (chronic  obstructive pulmonary disease) with chronic bronchitis (HCC)    03/07/2017-spirometry- severe airway obstruction FEV1 42  01/11/18-chest x-ray-improved cardiomegaly and pulmonary vascular congestion, stable mild changes of COPD      Allergic rhinitis   5 mm Lung nodule, solitary   Nocturnal hypoxemia   Cough   Sinusitis, acute   COPD exacerbation (HCC)   Chronic respiratory failure with hypoxia (HCC)      Allergies  Allergen Reactions  . Augmentin [Amoxicillin-Pot Clavulanate] Nausea And Vomiting    Immunization History  Administered Date(s) Administered  . Influenza Split 07/08/2013  . Influenza,inj,Quad PF,6+ Mos 07/31/2015, 09/03/2017  . Pneumococcal Polysaccharide-23 02/02/2014   Patient refuses flu vaccine  Past Medical History:  Diagnosis Date  . Bronchitis   . CAD (coronary artery disease)    a. 02/2002: CABG x3 with LIMA to LAD, RIMA to RCA, and SVG to OM  . CHF (congestive heart failure) ([place])   . COPD (chronic obstructive pulmonary disease) ([place])   . Headache   . Heart disease   . Hyperlipidemia   . Hypertension   . Impaired glucose tolerance 08/20/2014  . Sinusitis     Tobacco History: Social History   Tobacco Use  Smoking Status Former Smoker  . Packs/day: 0.50  . Years: 45.00  . Pack years: 22.50  . Types: Cigarettes  . Start date: 11/19/2018  Smokeless Tobacco Never Used  Tobacco Comment   had quit for several months(01/2018-10/2018) now 5-10 ceg a day 12/14/18   Counseling given: Yes Comment:  had quit for several months(01/2018-10/2018) now 5-10 ceg a day 12/14/18   Smoking assessment and cessation counseling  Patient currently smoking: 7-10 cigarettes I have advised the patient to quit/stop smoking as soon as possible due to high risk for multiple medical problems.  It will also be very difficult for [place] to manage patient's  respiratory symptoms and status if we continue to expose her lungs to a known irritant.  We do not advise e-cigarettes as a  form of stopping smoking.  Patient is willing to quit smoking. Pt has not set quit date  I have advised the patient that we can assist and have options of nicotine replacement therapy, provided smoking cessation education today, provided smoking cessation counseling, and provided cessation resources.  Patient reports that insurance will not pay for Chantix.  She is willing to try nicotine replacement therapies.  Patient reports that she cannot chew the gum.  Patient is willing to try patches as well as lozenges.  Follow-up next office visit office visit for assessment of smoking cessation.    Smoking cessation counseling advised for: 9 min   Outpatient Encounter Medications as of 12/14/2018  Medication Sig  . acetaminophen (TYLENOL) 500 MG tablet Take 1,000 mg by mouth at bedtime as needed for headache.  . albuterol (PROVENTIL HFA) 108 (90 Base) MCG/ACT inhaler INHALE 1-2 PUFFS INTO THE LUNGS EVERY 6 (SIX) HOURS AS NEEDED FOR WHEEZING OR SHORTNESS OF BREATH.  Marland Kitchen amLODipine (NORVASC) 5 MG tablet TAKE 1 TABLET BY MOUTH EVERY DAY  . carvedilol (COREG) 12.5 MG tablet TAKE 1 TABLET (12.5 MG TOTAL) BY MOUTH 2 (TWO) TIMES DAILY.  . cholecalciferol (VITAMIN D) 1000 units tablet Take 1,000 Units by mouth daily.  . fluticasone furoate-vilanterol (BREO ELLIPTA) 200-25 MCG/INH AEPB Inhale 1 puff into the lungs daily.  . furosemide (LASIX) 40 MG tablet Take 40 mg by mouth daily.  Marland Kitchen ipratropium-albuterol (DUONEB) 0.5-2.5 (3) MG/3ML SOLN TAKE 3 MLS BY NEBULIZATION EVERY 6 (SIX) HOURS AS NEEDED.  Marland Kitchen isosorbide mononitrate (IMDUR) 60 MG 24 hr tablet TAKE 1 & 1/2 TABLET BY MOUTH DAILY  . KLOR-CON M10 10 MEQ tablet TAKE 1 TABLET BY MOUTH EVERY DAY  . rosuvastatin (CRESTOR) 20 MG tablet Take 1 tablet (20 mg total) by mouth daily.  . vitamin B-12 (CYANOCOBALAMIN) 1000 MCG tablet Take 1,000 mcg by mouth daily.  . vitamin C (ASCORBIC ACID) 500 MG tablet Take 1,000 mg by mouth daily.  Marland Kitchen azithromycin (ZITHROMAX) 250  MG tablet 500mg  (two tablets) today, then 250mg  (1 tablet) for the next 4 days  . Fluticasone-Umeclidin-Vilant (TRELEGY ELLIPTA) 100-62.5-25 MCG/INH AEPB Inhale 1 puff into the lungs daily. (Patient not taking: Reported on 12/14/2018)  . Fluticasone-Umeclidin-Vilant (TRELEGY ELLIPTA) 100-62.5-25 MCG/INH AEPB Inhale 1 puff into the lungs daily.  . nicotine (NICODERM CQ) 21 mg/24hr patch Place 1 patch (21 mg total) onto the skin daily.  . nicotine polacrilex (COMMIT) 4 MG lozenge Take 1 lozenge (4 mg total) by mouth as needed for smoking cessation.  . predniSONE (DELTASONE) 10 MG tablet 4 tabs for 2 days, then 3 tabs for 2 days, 2 tabs for 2 days, then 1 tab for 2 days, then stop  . [DISCONTINUED] azithromycin (ZITHROMAX) 250 MG tablet Zpack taper as directed (Patient not taking: Reported on 12/14/2018)  . [DISCONTINUED] predniSONE (DELTASONE) 10 MG tablet Take 4 tabs po daily x 2 days; then 3 tabs for 2 days; then 2 tabs for 2 days; then 1 tab for 2 days (Patient not taking:  Reported on 12/14/2018)  . [EXPIRED] ipratropium-albuterol (DUONEB) 0.5-2.5 (3) MG/3ML nebulizer solution 3 mL    No facility-administered encounter medications on file as of 12/14/2018.      Review of Systems  Review of Systems  Constitutional: Positive for fatigue. Negative for fever.  HENT: Negative for congestion, sinus pressure and sinus pain.   Respiratory: Positive for cough (productive cough - green mucous ), chest tightness, shortness of breath and wheezing.   Cardiovascular: Positive for palpitations. Negative for chest pain.  Gastrointestinal: Negative for diarrhea, nausea and vomiting.  Psychiatric/Behavioral: Negative for dysphoric mood. The patient is not nervous/anxious.      Physical Exam  BP (!) 148/60 (BP Location: Left Arm, Cuff Size: Normal)   Pulse 75   Temp 98.4 F (36.9 C) (Oral)   Ht 5\' 5"  (5.093 m)   Wt 175 lb (79.4 kg)   SpO2 94%   BMI 29.12 kg/m   Wt Readings from Last 5 Encounters:    12/14/18 175 lb (79.4 kg)  11/09/18 173 lb (78.5 kg)  11/02/18 173 lb 9.6 oz (78.7 kg)  09/14/18 176 lb (79.8 kg)  08/13/18 171 lb (77.6 kg)    Physical Exam  Constitutional: She is oriented to person, place, and time and well-developed, well-nourished, and in no distress. No distress.  HENT:  Head: Normocephalic and atraumatic.  Right Ear: Hearing, tympanic membrane, external ear and ear canal normal.  Left Ear: Hearing, tympanic membrane, external ear and ear canal normal.  Nose: Mucosal edema and rhinorrhea present. Right sinus exhibits no maxillary sinus tenderness and no frontal sinus tenderness. Left sinus exhibits no maxillary sinus tenderness and no frontal sinus tenderness.  Mouth/Throat: Uvula is midline and oropharynx is clear and moist. No oropharyngeal exudate.  Eyes: Pupils are equal, round, and reactive to light.  Neck: Normal range of motion. Neck supple.  Cardiovascular: Normal rate, regular rhythm and normal heart sounds.  Pulmonary/Chest: Effort normal. No accessory muscle usage. No respiratory distress. She has no decreased breath sounds. She has wheezes (exp wheeze). She has no rhonchi.  Musculoskeletal: Normal range of motion.        General: No edema.  Lymphadenopathy:    She has no cervical adenopathy.  Neurological: She is alert and oriented to person, place, and time. Gait normal.  Skin: Skin is warm and dry. She is not diaphoretic. No erythema.  Psychiatric: Memory, affect and judgment normal. Her mood appears not anxious. She does not exhibit a depressed mood.  Nursing note and vitals reviewed.     Lab Results:  CBC    Component Value Date/Time   WBC 6.6 07/30/2018 1049   RBC 5.95 (H) 07/30/2018 1049   HGB 16.7 (H) 07/30/2018 1049   HGB 15.7 06/12/2014 1543   HCT 53.4 (H) 07/30/2018 1049   HCT 48.3 (H) 06/12/2014 1543   PLT 171 07/30/2018 1049   PLT 178 06/12/2014 1543   MCV 89.7 07/30/2018 1049   MCV 91 06/12/2014 1543   MCH 28.1 07/30/2018  1049   MCHC 31.3 07/30/2018 1049   RDW 13.2 07/30/2018 1049   RDW 14.8 (H) 06/12/2014 1543   LYMPHSABS 3.4 03/02/2018 1326   MONOABS 0.7 03/02/2018 1326   EOSABS 0.3 03/02/2018 1326   BASOSABS 0.1 03/02/2018 1326    BMET    Component Value Date/Time   NA 142 07/30/2018 1049   NA 142 06/12/2014 1543   K 3.9 07/30/2018 1049   K 3.6 06/12/2014 1543   CL 103 07/30/2018 1049  CL 105 06/12/2014 1543   CO2 31 07/30/2018 1049   CO2 29 06/12/2014 1543   GLUCOSE 128 (H) 07/30/2018 1049   GLUCOSE 113 (H) 06/12/2014 1543   BUN 8 07/30/2018 1049   BUN 12 06/12/2014 1543   CREATININE 0.51 07/30/2018 1049   CREATININE 0.62 06/12/2014 1543   CALCIUM 9.2 07/30/2018 1049   CALCIUM 8.9 06/12/2014 1543   GFRNONAA >60 07/30/2018 1049   GFRNONAA >60 06/12/2014 1543   GFRAA >60 07/30/2018 1049   GFRAA >60 06/12/2014 1543    BNP    Component Value Date/Time   BNP 70.0 01/11/2018 0821    ProBNP    Component Value Date/Time   PROBNP 485.2 (H) 02/01/2014 1300      Assessment & Plan:    COPD (chronic obstructive pulmonary disease) with chronic bronchitis (HCC) Assessment: Maintained on Breo Ellipta 200 Patient has resumed smoking No formal pulmonary function testing Patient is used her DuoNeb nebulized medication twice daily Office spirometry in May/2018 shows an FEV1 of 42% Expiratory wheeze on exam today Expiratory wheeze resolved after DuoNeb in office today mMRC 3  Plan: Z-Pak Prednisone taper DuoNeb in office today Restart Trelegy Ellipta, samples provided today Stop Breo Ellipta 200 Follow-up in 4 weeks with pulmonary function testing Emphasized the importance of stopping smoking 21 mg nicotine patches as well as 4 mg nicotine lozenges prescribed  Tobacco abuse Assessment: 63 year old female current every day smoker Smoking 7 to 10 cigarettes a day Interested in stopping smoking Has not set quit date 20-pack-year smoking history Patient reports insurance  will not pay for Chantix  Plan: Set quit date Start 21 mg nicotine patch Start 4 mg nicotine lozenge Follow-up in 4 weeks with pulmonary function testing Patient refuses flu vaccine today Patient is due for pneumonia vaccine in April/2020  Chronic respiratory failure with hypoxia ([place]) Plan: Continue oxygen therapy as prescribed Maintain oxygen saturations greater than 90%     Lauraine Rinne, NP 12/14/2018   This appointment was 44 min long with over 50% of the time in direct face-to-face patient care, assessment, plan of care, and follow-up.

## 2018-12-14 NOTE — Assessment & Plan Note (Addendum)
Assessment: 63 year old female current every day smoker Smoking 7 to 10 cigarettes a day Interested in stopping smoking Has not set quit date 20-pack-year smoking history Patient reports insurance will not pay for Chantix  Plan: Set quit date Start 21 mg nicotine patch Start 4 mg nicotine lozenge Follow-up in 4 weeks with pulmonary function testing Patient refuses flu vaccine today Patient is due for pneumonia vaccine in April/2020

## 2018-12-14 NOTE — Assessment & Plan Note (Addendum)
Assessment: Maintained on Breo Ellipta 200 Patient has resumed smoking No formal pulmonary function testing Patient is used her DuoNeb nebulized medication twice daily Office spirometry in May/2018 shows an FEV1 of 42% Expiratory wheeze on exam today Expiratory wheeze resolved after DuoNeb in office today mMRC 3  Plan: Z-Pak Prednisone taper DuoNeb in office today Restart Trelegy Ellipta, samples provided today Stop Breo Ellipta 200 Follow-up in 4 weeks with pulmonary function testing Emphasized the importance of stopping smoking 21 mg nicotine patches as well as 4 mg nicotine lozenges prescribed

## 2018-12-14 NOTE — Assessment & Plan Note (Signed)
Plan: Continue oxygen therapy as prescribed Maintain oxygen saturations greater than 90%

## 2018-12-14 NOTE — Telephone Encounter (Signed)
Called and spoke with Patient. She is a Dr Elsworth Soho Patient, last seen 11/09/18, by Derl Barrow, NP, for COPD.  She stated that Thursday, 12/10/18, she started having increased cough.  She stated that it has gotten progressively worse over the weekend.  She now has a productive cough, with brown colored phlegm.  She stated that she started experiencing increased SHOB, yesterday. She has been using Mucinex and nebs, with no relief.  She is scheduled with Wyn Quaker, NP, at 1130, 12/14/18.  Nothing further at this time.

## 2018-12-16 ENCOUNTER — Other Ambulatory Visit: Payer: Self-pay | Admitting: Pulmonary Disease

## 2018-12-16 DIAGNOSIS — Z720 Tobacco use: Secondary | ICD-10-CM

## 2018-12-22 ENCOUNTER — Other Ambulatory Visit: Payer: Self-pay

## 2018-12-22 ENCOUNTER — Telehealth: Payer: Self-pay | Admitting: Pulmonary Disease

## 2018-12-22 DIAGNOSIS — Z720 Tobacco use: Secondary | ICD-10-CM

## 2018-12-22 MED ORDER — PREDNISONE 10 MG PO TABS: 20.0000 mg | ORAL_TABLET | Freq: Every day | ORAL | 0 refills | Status: AC

## 2018-12-22 MED ORDER — NICOTINE POLACRILEX 4 MG MT LOZG: 4.0000 mg | LOZENGE | OROMUCOSAL | 0 refills | Status: DC | PRN

## 2018-12-22 NOTE — Telephone Encounter (Signed)
Can offer the patient:  Prednisone 10mg  tablet  >>>Take 2 tablets (20 mg total) daily for the next 5 days >>> Take with food in the morning  Please place the order.  Please remind the patient that she needs to be actively working to stop smoking.  Patient should have 21 mg nicotine patches as well as 4 mg nicotine lozenges to use.  Patient also needs to be adherent to Trelegy Ellipta.  If symptoms are not improving that she will need to present to our office for another office visit.  Patient needs to keep scheduled follow-up with me in March/2020.  Wyn Quaker, FNP

## 2018-12-22 NOTE — Telephone Encounter (Signed)
Spoke with patient to inform her that Wyn Quaker FNP has authorized 5 days of prednisone.  Request by patient to send to [facility].  Order sent and patient reminded of provider instructions to use nicotine prescriptions to help stop smoking, use trelegy as prescribed and follow up in office if not continuing to improve.  Patient acknowledged understanding.  Nothing further needed at this time.

## 2018-12-22 NOTE — Telephone Encounter (Signed)
Contacted [facility] [address] [phone] regarding a faxed request for 'lozenges.'  Pharmacist states patient is requesting nicotine lozenges but [facility] says they did not receive the prescription though our chart indicates it was sent.  Ok per Wyn Quaker FNP to reorder Nicotine 4mg  lozenge #100 as previously prescribed.  Patient was notified of prescription being sent in.  She also mentioned she would like 'a few more prednisone' as 'cough is not completely gone.  Offered an appointment to come in which patient declined.  Patient denies fever and is at work today but states she 'just felt a few more days of prednisone might help.'  Will route message to [place] for review.

## 2018-12-22 NOTE — Addendum Note (Signed)
Addended by: Joella Prince on: 12/22/2018 03:09 PM   Modules accepted: Orders

## 2019-01-12 ENCOUNTER — Ambulatory Visit: Payer: No Typology Code available for payment source | Admitting: Pulmonary Disease

## 2019-01-13 ENCOUNTER — Telehealth: Payer: Self-pay | Admitting: Pulmonary Disease

## 2019-01-13 NOTE — Telephone Encounter (Signed)
Please do not state duration. Just state that due to her pulmonary comorbidities, she would be at high risk for clinical worsening should she get coronavirus infection.  Please assist her in whatever manner possible

## 2019-01-13 NOTE — Telephone Encounter (Signed)
Message routed to Dr. Elsworth Soho:  Based on response, how long can she be written out of work for? 2 weeks, 1 month??? Please advise. Thank you.

## 2019-01-13 NOTE — Telephone Encounter (Signed)
Called and spoke with patient, she stated that she is going to stay out of work due to everything going on, in order for her to get paid while being out of work she will need a work note stating all of her current lung issues and why it is best for her to be out of work.    RA please advise if ok to do so. Thank you.

## 2019-01-13 NOTE — Telephone Encounter (Signed)
Spoke with patient. She is ok with said letter. She stated that she will get the letter from [place] since I advised her that we are trying to keep patients away from the office.   Letter will be typed.   Nothing further needed at time of call.

## 2019-01-13 NOTE — Telephone Encounter (Signed)
Okay to give generic note

## 2019-01-18 ENCOUNTER — Other Ambulatory Visit: Payer: Self-pay | Admitting: Internal Medicine

## 2019-01-27 ENCOUNTER — Telehealth: Payer: Self-pay | Admitting: Pulmonary Disease

## 2019-01-27 MED ORDER — PREDNISONE 10 MG PO TABS: ORAL_TABLET | ORAL | 0 refills | Status: DC

## 2019-01-27 NOTE — Telephone Encounter (Signed)
Primary Pulmonologist: RA Last office visit and with whom: 12/14/2018 with Wyn Quaker What do we see them for (pulmonary problems): COPD Last OV assessment/plan: pt wants meds to be prescribed  Was appointment offered to patient (explain)?  Instructions      Return in about 4 weeks (around 01/11/2019), or if symptoms worsen or fail to improve, for Follow up for PFT, Follow up with Wyn Quaker FNP-C.  Duoneb today   Azithromycin 250mg  tablet  >>>Take 2 tablets (500mg  total) today, and then 1 tablet (250mg ) for the next four days  >>>take with food  >>>can also take probiotic and / or yogurt while on antibiotic   Prednisone 10mg  tablet  >>>4 tabs for 2 days, then 3 tabs for 2 days, 2 tabs for 2 days, then 1 tab for 2 days, then stop >>>take with food  >>>take in the morning   Restart Trelegy Ellipta  >>> 1 puff daily in the morning >>>rinse mouth out after use  >>> This inhaler contains 3 medications that help manage her respiratory status, contact our office if you cannot afford this medication or unable to remain on this medication >>>THIS REPLACES YOUR BREO   Can use Duo neb nebulized meds every 6 hours as needed   We recommend that you stop smoking.  >>>You need to set a quit date >>>If you have friends or family who smoke, let them know you are trying to quit and not to smoke around you or in your living environment  Smoking Cessation Resources:  1 [employer]  >>> Patient to call this resource and utilize it to help support her quit smoking >>> Keep up your hard work with stopping smoking  You can also contact the [facility] >>>For smoking cessation classes call [phone]  We do not recommend using e-cigarettes as a form of stopping smoking  You can sign up for smoking cessation support texts and information:  >>>https://smokefree.gov/smokefreetxt  Nicotine patches: >>>Make sure you rotate sites that you do not get skin irritation, Apply 1  patch each morning to a non-hairy skin site  If you are smoking greater than 10 cigarettes/day and weigh over 45 kg start with the nicotine patch of 21 mg a day for 6 weeks, then 14 mg a day for 2 weeks, then finished with 7 mg a day for 2 weeks, then stop  >>>If insomnia occurs you are having trouble sleeping you can take the patch off at night, and place a new one on in the morning >>>If the patch is removed at night and you have morning cravings start short acting nicotine replacement therapy such as gum or lozenges  >>>Avoid acidic beverages such as coffee, carbonated beverages before and during tablet use.  A soft acidic beverages lower oral pH which cause nicotine to not be absorbed properly >>>If you chew the gum too quickly or vigorously you could have nausea, vomiting, abdominal pain, constipation, hiccups, headache, sore jaw, mouth irritation ulcers  Nicotine lozenge: Lozenges are commonly uses short acting NRT product  >>>Smokers who smoke within 30 minutes of awakening should use 4 mg dose >>>Smokers who wait more than 30 minutes after awakening to smoke should use 2 mg dose  Can use up to 1 lozenge every 1-2 hours for 6 weeks >>>Total amount of lozenges that can be used per day as 20 >>>Gradually reduce number of lozenges used per day after 2 weeks of use  Place lozenge in mouth and allowed to dissolve for 30  minutes loss and does not need to be chewed  Lozenges have advantages to be able to be used in people with TMG, poor dentition, dentures   Follow up in 4 weeks with a pulmonary function test with Wyn Quaker FNP         Reason for call: Called and spoke with pt who states she is having increased SOB, has a mild dry cough, and has some chest tightness which she says has been bothering her x1 week now. Pt also states she has been having to wear 3L O2 all the time now.  While on the phone with pt, she checked her O2 sats and pt was satting at 91% and that was  without O2.  Pt has been taking mucinex and has also been using her neb machine twice daily.  Pt denies any fever, has not travelled anywhere recently, and has not been around anyone that has been sick. Pt wants to have something prescribed to see if it would help with her symptoms.  Beth, please advise recs for pt. Thanks!

## 2019-01-27 NOTE — Telephone Encounter (Signed)
Called and spoke with pt stating to her the recs per Derl Barrow and stated to her that [facility] sent in pred taper Rx to pharmacy for her. Pt expressed understanding. Nothing further needed.

## 2019-01-27 NOTE — Telephone Encounter (Signed)
Make sure she is still taking her lasix 40mg  daily and Trelegy inhaler. Using albuterol 1-2 puffs every 6 hours.   Will send in prednisone taper. Advise she take flonase or OTC antihistamine during allergy season

## 2019-02-14 ENCOUNTER — Other Ambulatory Visit: Payer: Self-pay | Admitting: Internal Medicine

## 2019-02-22 ENCOUNTER — Telehealth: Payer: Self-pay | Admitting: Cardiology

## 2019-02-22 NOTE — Telephone Encounter (Signed)
Could have certainly been secondary to albuterol.  AAgree with plan.  She is on Coreg as well which can help.  Candee Furbish, MD

## 2019-02-22 NOTE — Telephone Encounter (Signed)
New Message    Patient c/o Palpitations:  High priority if patient c/o lightheadedness, shortness of breath, or chest pain  How long have you had palpitations/irregular HR/ Afib? Are you having the symptoms now? Heart was beating very fast, first time was Saturday during the day, and last night at 1am.  1) Are you currently experiencing lightheadedness, SOB or CP? Pt has some SOB and uses oxygen   2) Do you have a history of afib (atrial fibrillation) or irregular heart rhythm? Pt says she takes medication for her heart and had triple bypass 17 years ago   3) Have you checked your BP or HR? (document readings if available): No   Are you experiencing any other symptoms? Pt says she gets a really bad headache and she will notice her heart is racing. Last night she said it lasted about 30 mins or less.

## 2019-02-22 NOTE — Telephone Encounter (Signed)
Pt called to report that she had 2 episodes over the weekend of palpitations..   First one was Saturday when she was riding in a car with her sister and she developed a headache and heart was beating fast and she felt may have been irregular.. she had a little sob with it but no dizziness, chest pain and it lasted 5 minutes and got better on it's own.   She had an episode last night that woke her up with a headache and she had some palpitations that lasted about 10 minutes and felt diaphoretic but no chest pain and it got better on it's own.   Pt denies any other symptoms of illness and has not been around anyone that has been sick and has been working at home.   She is taking all of her meds without missing a dose and does not know what her VS are.   Pt has not had a change in her diet and no OTC meds.Marland Kitchen only change is she finished a steroid dose pak one month ago for her COPD given to her by Dr. Elsworth Soho, pulmonary and she restarted her Trelegy MDI about a week ago using q4-6 hours.   She says she is feeling well today so far.  I advised her I will forward to Dr. Marlou Porch and she should contact Dr. Elsworth Soho and ask if this could be a side effect of her MDI. She reports that she will and will call back if her symptoms continue.

## 2019-03-01 NOTE — Progress Notes (Signed)
Virtual Visit via Telephone Note   This visit type was conducted due to national recommendations for restrictions regarding the COVID-19 Pandemic (e.g. social distancing) in an effort to limit this patient's exposure and mitigate transmission in our community.  Due to her co-morbid illnesses, this patient is at least at moderate risk for complications without adequate follow up.  This format is felt to be most appropriate for this patient at this time.  The patient did not have access to video technology/had technical difficulties with video requiring transitioning to audio format only (telephone).  All issues noted in this document were discussed and addressed.  No physical exam could be performed with this format.  Please refer to the patient's chart for her  consent to telehealth for [facility].   Date:  03/01/2019   ID:  [name], DOB 01-Apr-1956, MRN [id number]  Patient Location: Home Provider Location: Office  PCP:  Biagio Borg, MD  Cardiologist:  Candee Furbish, MD  Electrophysiologist:  None   Evaluation Performed:  Follow-Up Visit  Chief Complaint:  palpitations and chest pain  History of Present Illness:    [name] is a 62 y.o. female with  coronary artery disease status post bypass 2003.  She is been to the emergency room previously with COPD exacerbation as well.  On last visit she was complaining of chest pressure in the emergency room on 07/30/2018 with fullness across the chest wall bilateral arm tingling and shortness of breath.  She does have oxygen at home at night.  After putting oxygen on her symptoms seem to get better when she rested.  When she walked to the car pain then returned.  This feels different to her than the way she felt prior to her CABG.  Again HA, shoulder and arm tingling, getting up to go to work, CP, trouble breath. O2 helped. Resolved. No sweats. Went to work, but when in car felt it again, O2 helped in car. At work panic.    troponins  were negative in the ER, hemoglobin 16.7, creatinine 0.5 EKG on 07/30/2018 looked like sinus bradycardia 56 with no other abnormal allergies. She went on to have nuc study which was neg for ischemia and normal EF.   holter las year with PVCs rare, PACS occ one brief episode of atrial tach.  No a fib.  She is on coreg.    Last week 2 episodes of palpitations rapid HR and maybe irregular.  No chest pain.  lasted 10 min and diaphoretic. Then the nxet night + H/A with both episodes.  She uses albuterol with COPD.  Continues to use tobacco.  HLD  This woke her from sleep.   Today unable to connect with video.  Her phone would not allow. With those two episodes she had headache but also chest pain.  She took ASA and was able to rest.  She had to sit up due to headache.  But no further episodes and she had not used her inhaler anytime before the episode it woke her from sleep.  She is unable to take BP.   The patient does not have symptoms concerning for COVID-19 infection (fever, chills, cough, or new shortness of breath).    Past Medical History:  Diagnosis Date  . Bronchitis   . CAD (coronary artery disease)    a. 02/2002: CABG x3 with LIMA to LAD, RIMA to RCA, and SVG to OM  . CHF (congestive heart failure) ([place])   . COPD (  chronic obstructive pulmonary disease) ([place])   . Headache   . Heart disease   . Hyperlipidemia   . Hypertension   . Impaired glucose tolerance 08/20/2014  . Sinusitis    Past Surgical History:  Procedure Laterality Date  . CORONARY ARTERY BYPASS GRAFT  2003   Triple bypass  . ESOPHAGOGASTRODUODENOSCOPY Left 06/13/2014   Procedure: ESOPHAGOGASTRODUODENOSCOPY (EGD);  Surgeon: Juanita Craver, MD;  Location: [facility] ENDOSCOPY;  Service: Endoscopy;  Laterality: Left;  . RIGID ESOPHAGOSCOPY N/A 06/12/2014   Procedure: RIGID ESOPHAGOSCOPY WITH FOREIGN BODY REMOVAL;  Surgeon: Ascencion Dike, MD;  Location: [place];  Service: ENT;  Laterality: N/A;     No outpatient medications  have been marked as taking for the 03/02/19 encounter (Appointment) with Isaiah Serge, NP.     Allergies:   Augmentin [amoxicillin-pot clavulanate]   Social History   Tobacco Use  . Smoking status: Former Smoker    Packs/day: 0.50    Years: 45.00    Pack years: 22.50    Types: Cigarettes    Start date: 11/19/2018  . Smokeless tobacco: Never Used  . Tobacco comment: had quit for several months(01/2018-10/2018) now 5-10 ceg a day 12/14/18  Substance Use Topics  . Alcohol use: Yes    Alcohol/week: 5.0 - 6.0 standard drinks    Types: 5 - 6 Glasses of wine per week  . Drug use: Yes    Types: Marijuana    Comment: as a teenager     Family Hx: The patient's family history includes Brain cancer in her mother; Cancer in her maternal grandmother and unknown relative; Heart disease in her mother; Lung cancer in her maternal aunt and paternal uncle; Stroke in an other family member; Suicidality in her brother.  ROS:   Please see the history of present illness.    General:no colds or fevers, no weight changes Skin:no rashes or ulcers HEENT:no blurred vision, no congestion CV:see HPI PUL:see HPI Neuro:no syncope, no lightheadedness + H/A  Endo:no diabetes, no thyroid disease  All other systems reviewed and are negative.   Prior CV studies:   The following studies were reviewed today:  Nuc study 07/2018 Study Highlights     Nuclear stress EF: 65%.  There was no ST segment deviation noted during stress.  The study is normal.  This is a low risk study.  The left ventricular ejection fraction is normal (55-65%).   Normal stress nuclear study with no ischemia or infarction.  Gated ejection fraction 65% with normal wall motion.   holter monitor 04/2018 Notes recorded by Jerline Pain, MD on 05/05/2018 at 7:21 AM EDT  68 PVCs (premature ventricular contractions)  130 PACs (premature atrial contractions)  Brief paroxysmal atrial tachycardia, 6 beats, 130 bpm.  No atrial  fibrillation, no pauses, no ventricular tachycardia  Reassuring monitor. Continue with carvedilol  Labs/Other Tests and Data Reviewed:    EKG:  An ECG dated 07/21/18 was personally reviewed today and demonstrated:  SB at 56 and normal EKG  Recent Labs: 07/30/2018: BUN 8; Creatinine, Ser 0.51; Hemoglobin 16.7; Platelets 171; Potassium 3.9; Sodium 142   Recent Lipid Panel Lab Results  Component Value Date/Time   CHOL 237 (H) 09/03/2017 03:38 PM   TRIG 302.0 (H) 09/03/2017 03:38 PM   HDL 29.00 (L) 09/03/2017 03:38 PM   CHOLHDL 8 09/03/2017 03:38 PM   LDLCALC 68 02/02/2014 03:17 AM   LDLDIRECT 157.0 09/03/2017 03:38 PM    Wt Readings from Last 3 Encounters:  12/14/18 175  lb (79.4 kg)  11/09/18 173 lb (78.5 kg)  11/02/18 173 lb 9.6 oz (78.7 kg)     Objective:    Vital Signs:  There were no vitals taken for this visit.  Unable to take BP  VITAL SIGNS:  reviewed  General NAD some anxiety, female voice that sounds older than 63 years Neuro A&O X 3 MAE follows commands Lungs no obvious SOB with speaking Psych:  When talking sounds pleasant but flat  ASSESSMENT & PLAN:    1. tachycardia she has worn monitor last summer.  6 beats of PAT. She is on BB but unable to take her BP - have asked her to come in and be seen.  She is agreeable. With combination of chest pain would like EKG and BP she has been mildly brady in past doubt we could increase her BB 2. Chest pain along with h/a.  I suppose possible spasm could cause.  She does have CAD and would be prudent to bring in for EKG. The pain and tachycardia woke her from sleep. 3. CAD s/p CABG no cath since CABG in 2003. It has been 17 years and she had neg nuc in 2019, which was reassuring. 4. HLD on crestor 5. Tobacco use continues.  At least 5 cigarettes per day   COVID-19 Education: The signs and symptoms of COVID-19 were discussed with the patient and how to seek care for testing (follow up with PCP or arrange E-visit).  The  importance of social distancing was discussed today.  Time:   Today, I have spent 10 minutes with the patient with telehealth technology discussing the above problems.     Medication Adjustments/Labs and Tests Ordered: Current medicines are reviewed at length with the patient today.  Concerns regarding medicines are outlined above.   Tests Ordered: No orders of the defined types were placed in this encounter.   Medication Changes: No orders of the defined types were placed in this encounter.   Disposition:  Follow up in 1 day(s)  Signed, Cecilie Kicks, NP  03/01/2019 9:25 PM    [facility]

## 2019-03-02 ENCOUNTER — Ambulatory Visit (INDEPENDENT_AMBULATORY_CARE_PROVIDER_SITE_OTHER): Payer: No Typology Code available for payment source | Admitting: Cardiology

## 2019-03-02 ENCOUNTER — Other Ambulatory Visit: Payer: Self-pay

## 2019-03-02 ENCOUNTER — Encounter: Payer: Self-pay | Admitting: *Deleted

## 2019-03-02 ENCOUNTER — Telehealth (INDEPENDENT_AMBULATORY_CARE_PROVIDER_SITE_OTHER): Payer: No Typology Code available for payment source | Admitting: Cardiology

## 2019-03-02 ENCOUNTER — Encounter: Payer: Self-pay | Admitting: Cardiology

## 2019-03-02 VITALS — BP 122/66 | HR 63 | Ht 65.0 in | Wt 180.2 lb

## 2019-03-02 DIAGNOSIS — I10 Essential (primary) hypertension: Secondary | ICD-10-CM

## 2019-03-02 DIAGNOSIS — R002 Palpitations: Secondary | ICD-10-CM

## 2019-03-02 DIAGNOSIS — Z951 Presence of aortocoronary bypass graft: Secondary | ICD-10-CM

## 2019-03-02 DIAGNOSIS — J438 Other emphysema: Secondary | ICD-10-CM

## 2019-03-02 DIAGNOSIS — Z720 Tobacco use: Secondary | ICD-10-CM

## 2019-03-02 DIAGNOSIS — R000 Tachycardia, unspecified: Secondary | ICD-10-CM

## 2019-03-02 DIAGNOSIS — I471 Supraventricular tachycardia: Secondary | ICD-10-CM | POA: Diagnosis not present

## 2019-03-02 DIAGNOSIS — I25709 Atherosclerosis of coronary artery bypass graft(s), unspecified, with unspecified angina pectoris: Secondary | ICD-10-CM

## 2019-03-02 DIAGNOSIS — R0789 Other chest pain: Secondary | ICD-10-CM

## 2019-03-02 MED ORDER — DILTIAZEM HCL 30 MG PO TABS: 30.0000 mg | ORAL_TABLET | Freq: Four times a day (QID) | ORAL | 6 refills | Status: DC | PRN

## 2019-03-02 NOTE — Patient Instructions (Signed)
Medication Instructions:  You may take Diltiazem 30 mg once every 6 hours as needed for palpitations. Continue all other medications as listed.  If you need a refill on your cardiac medications before your next appointment, please call your pharmacy.   Follow-Up: At [facility], you and your health needs are our priority.  As part of our continuing mission to provide you with exceptional heart care, we have created designated Provider Care Teams.  These Care Teams include your primary Cardiologist (physician) and Advanced Practice Providers (APPs -  Physician Assistants and Nurse Practitioners) who all work together to provide you with the care you need, when you need it. You will need a follow up appointment in 3 months with Cecilie Kicks, NP and 6 months with Dr Marlou Porch.  Please call our office 2 months in advance to schedule this appointment.  You may see Candee Furbish, MD or one of the following Advanced Practice Providers on your designated Care Team:   Truitt Merle, NP Cecilie Kicks, NP . Kathyrn Drown, NP  Thank you for choosing [facility]!!

## 2019-03-02 NOTE — Progress Notes (Signed)
Cardiology Office Note:    Date:  03/02/2019   ID:  [name], [name] June 09, 1956, MRN [id number]  PCP:  Biagio Borg, MD  Cardiologist:  Candee Furbish, MD  Electrophysiologist:  None   Referring MD: Biagio Borg, MD   Palpitations follow-up  History of Present Illness:    [name] is a 63 y.o. female with coronary artery disease status post CABG, COPD with palpitations.  Prior Holter monitor last year showed PVCs rare PACs.  One episode of atrial tachycardia, no atrial fibrillation.  Currently on carvedilol.  Last week she did have an episode of rapid heart rate may be irregular.  Last about 10 minutes then she became diaphoretic.  She had a headache with both episodes.  Used albuterol.  This woke her up from her sleep. 2 times last week. Head pounding. Arms feel weak. Sleep with O2 at night. No OSA.     Past Medical History:  Diagnosis Date  . Bronchitis   . CAD (coronary artery disease)    a. 02/2002: CABG x3 with LIMA to LAD, RIMA to RCA, and SVG to OM  . CHF (congestive heart failure) ([place])   . COPD (chronic obstructive pulmonary disease) ([place])   . Headache   . Heart disease   . Hyperlipidemia   . Hypertension   . Impaired glucose tolerance 08/20/2014  . Sinusitis     Past Surgical History:  Procedure Laterality Date  . CORONARY ARTERY BYPASS GRAFT  2003   Triple bypass  . ESOPHAGOGASTRODUODENOSCOPY Left 06/13/2014   Procedure: ESOPHAGOGASTRODUODENOSCOPY (EGD);  Surgeon: Juanita Craver, MD;  Location: [facility] ENDOSCOPY;  Service: Endoscopy;  Laterality: Left;  . RIGID ESOPHAGOSCOPY N/A 06/12/2014   Procedure: RIGID ESOPHAGOSCOPY WITH FOREIGN BODY REMOVAL;  Surgeon: Ascencion Dike, MD;  Location: [facility];  Service: ENT;  Laterality: N/A;    Current Medications: Current Meds  Medication Sig  . acetaminophen (TYLENOL) 500 MG tablet Take 1,000 mg by mouth at bedtime as needed for headache.  . albuterol (PROVENTIL HFA) 108 (90 Base) MCG/ACT inhaler INHALE 1-2 PUFFS INTO THE  LUNGS EVERY 6 (SIX) HOURS AS NEEDED FOR WHEEZING OR SHORTNESS OF BREATH.  Marland Kitchen amLODipine (NORVASC) 5 MG tablet TAKE 1 TABLET BY MOUTH EVERY DAY  . carvedilol (COREG) 12.5 MG tablet TAKE 1 TABLET (12.5 MG TOTAL) BY MOUTH 2 (TWO) TIMES DAILY.  . cholecalciferol (VITAMIN D) 1000 units tablet Take 1,000 Units by mouth daily.  . Fluticasone-Umeclidin-Vilant (TRELEGY ELLIPTA) 100-62.5-25 MCG/INH AEPB Inhale 1 puff into the lungs daily.  . furosemide (LASIX) 40 MG tablet Take 20 mg by mouth daily.  Marland Kitchen ipratropium-albuterol (DUONEB) 0.5-2.5 (3) MG/3ML SOLN TAKE 3 MLS BY NEBULIZATION EVERY 6 (SIX) HOURS AS NEEDED.  Marland Kitchen isosorbide mononitrate (IMDUR) 60 MG 24 hr tablet TAKE 1 & 1/2 TABLET BY MOUTH DAILY  . potassium chloride (K-DUR) 10 MEQ tablet Take 5 mEq by mouth daily.  . vitamin B-12 (CYANOCOBALAMIN) 1000 MCG tablet Take 1,000 mcg by mouth daily.  . vitamin C (ASCORBIC ACID) 500 MG tablet Take 1,000 mg by mouth daily.     Allergies:   Augmentin [amoxicillin-pot clavulanate]   Social History   Socioeconomic History  . Marital status: Single    Spouse name: Not on file  . Number of children: Not on file  . Years of education: Not on file  . Highest education level: Not on file  Occupational History  . Not on file  Social Needs  . Financial resource strain:  Not on file  . Food insecurity:    Worry: Not on file    Inability: Not on file  . Transportation needs:    Medical: Not on file    Non-medical: Not on file  Tobacco Use  . Smoking status: Former Smoker    Packs/day: 0.50    Years: 45.00    Pack years: 22.50    Types: Cigarettes    Start date: 11/19/2018  . Smokeless tobacco: Never Used  . Tobacco comment: had quit for several months(01/2018-10/2018) now 5-10 ceg a day 12/14/18  Substance and Sexual Activity  . Alcohol use: Yes    Alcohol/week: 5.0 - 6.0 standard drinks    Types: 5 - 6 Glasses of wine per week  . Drug use: Yes    Types: Marijuana    Comment: as a teenager  . Sexual  activity: Not Currently    Birth control/protection: None  Lifestyle  . Physical activity:    Days per week: Not on file    Minutes per session: Not on file  . Stress: Not on file  Relationships  . Social connections:    Talks on phone: Not on file    Gets together: Not on file    Attends religious service: Not on file    Active member of club or organization: Not on file    Attends meetings of clubs or organizations: Not on file    Relationship status: Not on file  Other Topics Concern  . Not on file  Social History Narrative  . Not on file     Family History: The patient's family history includes Brain cancer in her mother; Cancer in her maternal grandmother and unknown relative; Heart disease in her mother; Lung cancer in her maternal aunt and paternal uncle; Stroke in an other family member; Suicidality in her brother.  ROS:   Please see the history of present illness.     All other systems reviewed and are negative.  EKGs/Labs/Other Studies Reviewed:    The following studies were reviewed today:  Nuclear stress test 07/2018-low risk normal EF.  Holter monitor 04/2018 - 68 PVCs 130 PACs, brief PAT 6 beats 130 bpm no A. fib.  On carvedilol.  EKG:  EKG is  ordered today.  The ekg ordered today demonstrates sinus rhythm 63 with no other significant abnormalities personally reviewed.  Recent Labs: 07/30/2018: BUN 8; Creatinine, Ser 0.51; Hemoglobin 16.7; Platelets 171; Potassium 3.9; Sodium 142  Recent Lipid Panel    Component Value Date/Time   CHOL 237 (H) 09/03/2017 1538   TRIG 302.0 (H) 09/03/2017 1538   HDL 29.00 (L) 09/03/2017 1538   CHOLHDL 8 09/03/2017 1538   VLDL 60.4 (H) 09/03/2017 1538   LDLCALC 68 02/02/2014 0317   LDLDIRECT 157.0 09/03/2017 1538    Physical Exam:    VS:  BP 122/66   Pulse 63   Ht 5\' 5"  (1.651 m)   Wt 180 lb 3.2 oz (81.7 kg)   BMI 29.99 kg/m     Wt Readings from Last 3 Encounters:  03/02/19 180 lb 3.2 oz (81.7 kg)  12/14/18  175 lb (79.4 kg)  11/09/18 173 lb (78.5 kg)     GEN:  Well nourished, well developed in no acute distress HEENT: Normal NECK: No JVD; No carotid bruits LYMPHATICS: No lymphadenopathy CARDIAC: RRR, 1/6 systolic murmur, no rubs, gallops RESPIRATORY:  Clear to auscultation without rales, wheezing or rhonchi  ABDOMEN: Soft, non-tender, non-distended MUSCULOSKELETAL:  No edema; No deformity  SKIN: Warm and dry NEUROLOGIC:  Alert and oriented x 3 PSYCHIATRIC:  Normal affect   ASSESSMENT:    1. Paroxysmal atrial tachycardia ([place])   2. Essential hypertension   3. Palpitations   4. Status post coronary artery bypass grafting   5. Tachycardia   6. Coronary artery disease involving coronary bypass graft of native heart with angina pectoris ([place])   7. Other emphysema (HCC)    PLAN:    In order of problems listed above:  Paroxysmal atrial tachycardia - Still having the sensations.  Currently at rest her heart rate is 63 bpm, she is taking carvedilol 12.5 mg twice a day.  Certainly the albuterol for her COPD could be exacerbating some of these episodes as well. - I will give her Cardizem 30 mg p.o. every 6 hours as needed palpitations to be taken at night only as needed if she is having an episode.  Her last episode occurred about 2 weeks ago.  Previously EF normal.  Prior TSH in 2018 was normal at 3.5.  Coronary artery disease - Prior CABG.  Aggressive secondary prevention. -Prior stress test reassuring.  Tobacco use -Continue to encourage cessation  COPD -Certainly albuterol can precipitate some of these palpitations.  She has been tested for obstructive sleep apnea and she does not have this condition.  If her oxygen slips off at night, could transient hypoxia be precipitating some of these heart palpitations, possibly.   Medication Adjustments/Labs and Tests Ordered: Current medicines are reviewed at length with the patient today.  Concerns regarding medicines are outlined above.   Orders Placed This Encounter  Procedures  . EKG 12-Lead   Meds ordered this encounter  Medications  . diltiazem (CARDIZEM) 30 MG tablet    Sig: Take 1 tablet (30 mg total) by mouth every 6 (six) hours as needed.    Dispense:  30 tablet    Refill:  6    Patient Instructions  Medication Instructions:  You may take Diltiazem 30 mg once every 6 hours as needed for palpitations. Continue all other medications as listed.  If you need a refill on your cardiac medications before your next appointment, please call your pharmacy.   Follow-Up: At [facility], you and your health needs are our priority.  As part of our continuing mission to provide you with exceptional heart care, we have created designated Provider Care Teams.  These Care Teams include your primary Cardiologist (physician) and Advanced Practice Providers (APPs -  Physician Assistants and Nurse Practitioners) who all work together to provide you with the care you need, when you need it. You will need a follow up appointment in 3 months with Cecilie Kicks, NP and 6 months with Dr Marlou Porch.  Please call our office 2 months in advance to schedule this appointment.  You may see Candee Furbish, MD or one of the following Advanced Practice Providers on your designated Care Team:   Truitt Merle, NP Cecilie Kicks, NP . Kathyrn Drown, NP  Thank you for choosing [facility]!!        Signed, Candee Furbish, MD  03/02/2019 10:45 AM    [place]

## 2019-03-02 NOTE — Patient Instructions (Addendum)
Medication Instructions:  Your physician recommends that you continue on your current medications as directed. Please refer to the Current Medication list given to you today.  If you need a refill on your cardiac medications before your next appointment, please call your pharmacy.   Lab work: None If you have labs (blood work) drawn today and your tests are completely normal, you will receive your results only by: Marland Kitchen MyChart Message (if you have MyChart) OR . A paper copy in the mail If you have any lab test that is abnormal or we need to change your treatment, we will call you to review the results.  Testing/Procedures: None  Follow-Up: You will need a follow up appointment in office visit today 03/02/19 at 9:20 am with  Candee Furbish, MD

## 2019-03-11 ENCOUNTER — Telehealth: Payer: Self-pay | Admitting: Pulmonary Disease

## 2019-03-11 ENCOUNTER — Ambulatory Visit: Payer: Self-pay

## 2019-03-11 MED ORDER — PREDNISONE 10 MG PO TABS: ORAL_TABLET | ORAL | 0 refills | Status: DC

## 2019-03-11 MED ORDER — AZITHROMYCIN 250 MG PO TABS: ORAL_TABLET | ORAL | 0 refills | Status: DC

## 2019-03-11 NOTE — Telephone Encounter (Signed)
Checked with Cherina to see if dx code was placed on the zpak and per Cherina, code was left off. Called pt's pharmacy to provide dx code for the zpak which would be COPD exacerbation (J44.1).  Spoke with pt's pharmacy and provided the ICD code for the zpak and they stated they will get Rx ready for pt. Called and spoke with pt letting her know this info and stated to her that the pharmacy should be getting Rx ready for her. Pt verbalized understanding. Nothing further needed.

## 2019-03-11 NOTE — Telephone Encounter (Signed)
Okay to offer:  Azithromycin 250mg  tablet  >>>Take 2 tablets (500mg  total) today, and then 1 tablet (250mg ) for the next four days  >>>take with food  >>>can also take probiotic and / or yogurt while on antibiotic   Prednisone 10mg  tablet  >>>4 tabs for 2 days, then 3 tabs for 2 days, 2 tabs for 2 days, then 1 tab for 2 days, then stop >>>take with food  >>>take in the morning   Please place the order  Patient continue her other medications.  Please have patient be set up for a 2-week video visit or in office visit with me or Dr. Elsworth Soho or another APP.  To ensure the symptoms are improving.  Patient will need to be seen sooner than her July/13/2020 currently scheduled office visit.  If symptoms worsen, fevers develop, body aches or chills develop, or shortness of breath is not improving then she will need to present to an emergency room for further evaluation.  Wyn Quaker, FNP

## 2019-03-11 NOTE — Telephone Encounter (Signed)
Primary Pulmonologist: RA Last office visit and with whom: 12/14/18 with Aaron Edelman What do we see them for (pulmonary problems): COPD, resp failure Last OV assessment/plan: Duoneb today   Azithromycin 250mg  tablet  >>>Take 2 tablets (500mg  total) today, and then 1 tablet (250mg ) for the next four days  >>>take with food  >>>can also take probiotic and / or yogurt while on antibiotic   Prednisone 10mg  tablet  >>>4 tabs for 2 days, then 3 tabs for 2 days, 2 tabs for 2 days, then 1 tab for 2 days, then stop >>>take with food  >>>take in the morning   Restart Trelegy Ellipta  >>> 1 puff daily in the morning >>>rinse mouth out after use  >>> This inhaler contains 3 medications that help manage her respiratory status, contact our office if you cannot afford this medication or unable to remain on this medication >>>THIS REPLACES YOUR BREO   Can use Duo neb nebulized meds every 6 hours as needed   We recommend that you stop smoking.  >>>You need to set a quit date >>>If you have friends or family who smoke, let them know you are trying to quit and not to smoke around you or in your living environment  Was appointment offered to patient (explain)?  Patient wanted recommendations first   Reason for call: Spoke with patient. She stated that she has been SOB for the past 4-5 days. Her SOB got worse last night and she had to increase her O2 to 4L. This helped somewhat but she still feels SOB this morning. She has been using her Trelegy and Duonebs with no relief. Denies having a cough or fever. Denies any exposure to COVID as she works from home.   She stated that she had this episode back in February and was prescribed a prednisone taper and zpak and these worked well for her.   Pharmacy is [facility] on [address].   Aaron Edelman, please advise. Thanks!

## 2019-03-11 NOTE — Telephone Encounter (Signed)
Spoke with patient, she is aware of recs. Will go ahead and call in medication.   Patient is also aware that she will need an OV in 2 weeks. She has been scheduled to see Aaron Edelman on 6/4 at 4pm in office. She verbalized understanding.   Nothing further needed at time of call.

## 2019-03-11 NOTE — Telephone Encounter (Signed)
Incoming CallFrom Patient  Wanting to be screed for Covid .  Patient was triaged  For Covid  No Sx.   Noted Related to Pt.  If  Sx  delvelope to please call back.  Pt.  Has  Hx of COPD.  On 2 L O2.    Reason for Disposition . [1] Longstanding difficulty breathing (e.g., CHF, COPD, emphysema) AND [2] WORSE than normal  Answer Assessment - Initial Assessment Questions 1. RESPIRATORY STATUS: "Describe your breathing?" (e.g., wheezing, shortness of breath, unable to speak, severe coughing)   SOB  02  liters 2. ONSET: "When did this breathing problem begin?"      Last night 3. PATTERN "Does the difficult breathing come and go, or has it been constant since it started?"    constant 4. SEVERITY: "How bad is your breathing?" (e.g., mild, moderate, severe)    - MILD: No SOB at rest, mild SOB with walking, speaks normally in sentences, can lay down, no retractions, pulse < 100.    - MODERATE: SOB at rest, SOB with minimal exertion and prefers to sit, cannot lie down flat, speaks in phrases, mild retractions, audible wheezing, pulse 100-120.    - SEVERE: Very SOB at rest, speaks in single words, struggling to breathe, sitting hunched forward, retractions, pulse > 120      Last night severe 5. RECURRENT SYMPTOM: "Have you had difficulty breathing before?" If so, ask: "When was the last time?" and "What happened that time?"      6. CARDIAC HISTORY: "Do you have any history of heart disease?" (e.g., heart attack, angina, bypass surgery, angioplasty)      yes 7. LUNG HISTORY: "Do you have any history of lung disease?"  (e.g., pulmonary embolus, asthma, emphysema)    yes 8. CAUSE: "What do you think is causing the breathing problem?"       9. OTHER SYMPTOMS: "Do you have any other symptoms? (e.g., dizziness, runny nose, cough, chest pain, fever)     denies10. PREGNANCY: "Is there any chance you are pregnant?" "When was your last menstrual period?"      Na  11. TRAVEL: "Have you traveled out of the  country in the last month?" (e.g., travel history, exposures)       *No Answer*  Protocols used: BREATHING DIFFICULTY-A-AH

## 2019-03-18 ENCOUNTER — Telehealth: Payer: Self-pay | Admitting: Pulmonary Disease

## 2019-03-18 NOTE — Progress Notes (Signed)
@Patient  ID: [name], female    DOB: Apr 09, 1956, 63 y.o.   MRN: [id number]  Chief Complaint  Patient presents with  . Follow-up    COPD    Referring provider: Biagio Borg, MD  HPI:  63 year old female former smoker followed in our office for chronic GOLD COPD III (based off of office spirometry in May/2018)  PMH: GERD Smoker/ Smoking History: Current Smoker. 1-2 cigarettes. 20-pack-year. Maintenance: Trelegy Ellipta  Pt of: Dr. Elsworth Soho  03/19/2019  - Visit   63 year old female current every day smoker (smoking 1 to 2 cigarettes a day) presenting to our office today as a follow-up from being treated telephonically with azithromycin prednisone.  Patient does not feel that her symptoms have improved at all.  She continues to be short of breath as well as fatigue.  Patient was screened for COVID-19 telephonically by our clinical staff but unfortunately it was not revealed to them that the patient currently has a pending SARS-CoV-2 test from the health department.  Patient reports that she had this performed on 03/12/2019.  She has not received the results of this yet.  She was swabbed due to her fatigue as well as her shortness of breath in the setting of a global pandemic.  Patient denies wheezing, fever, body aches, chills, productive cough, increased sputum production.  Patient is also supposed to wear 2 L of O2 with exertion.  She is not wearing this today on arrival to our office.  She is carrying her portable oxygen concentrator.  Oxygen saturations are 87% on room air.  MMRC - Breathlessness Score 3 - I stop for breath after walking about 100 yards or after a few minutes on level ground (isle at grocery store is 179ft)       Tests:  03/07/2017-spirometry- severe airway obstruction FEV1 42  Imaging:   01/11/18-chest x-ray-improved cardiomegaly and pulmonary vascular congestion, stable mild changes of COPD  12/09/2016-CT Angio- no radiographic evidence of pulmonary  embolism   Cardiac:  02/09/2017-echocardiogram LV ejection fraction 60 to 76%, grade 2 diastolic dysfunction   FENO:  No results found for: NITRICOXIDE  PFT: No flowsheet data found.  Imaging: No results found.    Specialty Problems      Pulmonary Problems   COPD (chronic obstructive pulmonary disease) with chronic bronchitis (HCC)    03/07/2017-spirometry- severe airway obstruction FEV1 42  01/11/18-chest x-ray-improved cardiomegaly and pulmonary vascular congestion, stable mild changes of COPD      Allergic rhinitis   5 mm Lung nodule, solitary   Nocturnal hypoxemia   Cough   Sinusitis, acute   COPD exacerbation (HCC)   Chronic respiratory failure with hypoxia (HCC)      Allergies  Allergen Reactions  . Augmentin [Amoxicillin-Pot Clavulanate] Nausea And Vomiting    Immunization History  Administered Date(s) Administered  . Influenza Split 07/08/2013  . Influenza,inj,Quad PF,6+ Mos 07/31/2015, 09/03/2017  . Pneumococcal Polysaccharide-23 02/02/2014   Patient needs pneumonia vaccine at next office visit when she is clinically stable  Past Medical History:  Diagnosis Date  . Bronchitis   . CAD (coronary artery disease)    a. 02/2002: CABG x3 with LIMA to LAD, RIMA to RCA, and SVG to OM  . CHF (congestive heart failure) ([place])   . COPD (chronic obstructive pulmonary disease) ([place])   . Headache   . Heart disease   . Hyperlipidemia   . Hypertension   . Impaired glucose tolerance 08/20/2014  . Sinusitis     Tobacco History:  Social History   Tobacco Use  Smoking Status Current Every Day Smoker  . Packs/day: 0.50  . Years: 47.00  . Pack years: 23.50  . Types: Cigarettes  . Start date: 12/29/1971  Smokeless Tobacco Never Used  Tobacco Comment   had quit for several months(01/2018-10/2018) now 5-10 ceg a day 12/14/18 --1-2 cigs a day 03/19/19   Ready to quit: Not Answered Counseling given: Yes Comment: had quit for several months(01/2018-10/2018) now 5-10  ceg a day 12/14/18 --1-2 cigs a day 03/19/19  Smoking assessment and cessation counseling  Patient currently smoking: 1-2 cigarettes today  I have advised the patient to quit/stop smoking as soon as possible due to high risk for multiple medical problems.  It will also be very difficult for [place] to manage patient's  respiratory symptoms and status if we continue to expose her lungs to a known irritant.  We do not advise e-cigarettes as a form of stopping smoking.  Patient is willing to quit smoking. Has not set quit date.   I have advised the patient that we can assist and have options of nicotine replacement therapy, provided smoking cessation education today, provided smoking cessation counseling, and provided cessation resources.  Follow-up next office visit office visit for assessment of smoking cessation.  Smoking cessation counseling advised for: 5 min   Outpatient Encounter Medications as of 03/19/2019  Medication Sig  . acetaminophen (TYLENOL) 500 MG tablet Take 1,000 mg by mouth at bedtime as needed for headache.  . albuterol (PROVENTIL HFA) 108 (90 Base) MCG/ACT inhaler INHALE 1-2 PUFFS INTO THE LUNGS EVERY 6 (SIX) HOURS AS NEEDED FOR WHEEZING OR SHORTNESS OF BREATH.  Marland Kitchen amLODipine (NORVASC) 5 MG tablet TAKE 1 TABLET BY MOUTH EVERY DAY  . carvedilol (COREG) 12.5 MG tablet TAKE 1 TABLET (12.5 MG TOTAL) BY MOUTH 2 (TWO) TIMES DAILY.  . cholecalciferol (VITAMIN D) 1000 units tablet Take 1,000 Units by mouth daily.  Marland Kitchen diltiazem (CARDIZEM) 30 MG tablet Take 1 tablet (30 mg total) by mouth every 6 (six) hours as needed.  . Fluticasone-Umeclidin-Vilant (TRELEGY ELLIPTA) 100-62.5-25 MCG/INH AEPB Inhale 1 puff into the lungs daily.  . furosemide (LASIX) 40 MG tablet Take 20 mg by mouth daily.  Marland Kitchen ipratropium-albuterol (DUONEB) 0.5-2.5 (3) MG/3ML SOLN TAKE 3 MLS BY NEBULIZATION EVERY 6 (SIX) HOURS AS NEEDED.  Marland Kitchen isosorbide mononitrate (IMDUR) 60 MG 24 hr tablet TAKE 1 & 1/2 TABLET BY MOUTH DAILY   . potassium chloride (K-DUR) 10 MEQ tablet Take 5 mEq by mouth daily.  . vitamin B-12 (CYANOCOBALAMIN) 1000 MCG tablet Take 1,000 mcg by mouth daily.  . vitamin C (ASCORBIC ACID) 500 MG tablet Take 1,000 mg by mouth daily.  . [DISCONTINUED] predniSONE (DELTASONE) 10 MG tablet Take 4 tabs for 2 days, then 3 tabs for 2 days, 2 tabs for 2 days, then 1 tab for 2 days, then stop.  . [DISCONTINUED] azithromycin (ZITHROMAX) 250 MG tablet Take 2 tablets on first day, then 1 tablet daily until finished. (Patient not taking: Reported on 03/19/2019)   No facility-administered encounter medications on file as of 03/19/2019.      Review of Systems  Review of Systems  Constitutional: Positive for fatigue. Negative for chills, fever and unexpected weight change.  HENT: Negative for postnasal drip, sinus pressure and sinus pain.   Respiratory: Positive for shortness of breath. Negative for cough, chest tightness and wheezing.   Cardiovascular: Negative for chest pain and palpitations.  Gastrointestinal: Negative for diarrhea, nausea and vomiting.  Musculoskeletal:  Negative for arthralgias.  Skin: Negative for color change.  Allergic/Immunologic: Negative for environmental allergies and food allergies.  Neurological: Negative for dizziness, light-headedness and headaches.  Psychiatric/Behavioral: Negative for dysphoric mood. The patient is not nervous/anxious.   All other systems reviewed and are negative.    Physical Exam  BP (!) 118/52 (BP Location: Left Arm, Cuff Size: Normal)   Pulse 68   Temp 98.9 F (37.2 C)   Ht 5\' 1"  (1.549 m)   Wt 178 lb 3.2 oz (80.8 kg)   BMI 33.67 kg/m   Wt Readings from Last 5 Encounters:  03/19/19 178 lb 3.2 oz (80.8 kg)  03/02/19 180 lb 3.2 oz (81.7 kg)  12/14/18 175 lb (79.4 kg)  11/09/18 173 lb (78.5 kg)  11/02/18 173 lb 9.6 oz (78.7 kg)     Physical Exam  Constitutional: She is oriented to person, place, and time and well-developed, well-nourished, and  in no distress. No distress.  HENT:  Head: Normocephalic and atraumatic.  Right Ear: Hearing, tympanic membrane, external ear and ear canal normal.  Left Ear: Hearing, tympanic membrane, external ear and ear canal normal.  Mouth/Throat: Uvula is midline and oropharynx is clear and moist. No oropharyngeal exudate.  Postnasal drip  Eyes: Pupils are equal, round, and reactive to light.  Neck: Normal range of motion. Neck supple.  Cardiovascular: Normal rate, regular rhythm and normal heart sounds.  Pulmonary/Chest: Effort normal and breath sounds normal. No accessory muscle usage. No respiratory distress. She has no decreased breath sounds. She has no wheezes. She has no rhonchi.  Few scattered squeaks  Abdominal: Soft. Bowel sounds are normal. She exhibits no distension. There is no abdominal tenderness.  Musculoskeletal: Normal range of motion.        General: No edema.  Lymphadenopathy:    She has no cervical adenopathy.  Neurological: She is alert and oriented to person, place, and time. Gait normal.  Skin: Skin is warm and dry. She is not diaphoretic. No erythema.  Psychiatric: Mood, memory, affect and judgment normal.  Nursing note and vitals reviewed.     Lab Results:  CBC    Component Value Date/Time   WBC 6.6 07/30/2018 1049   RBC 5.95 (H) 07/30/2018 1049   HGB 16.7 (H) 07/30/2018 1049   HGB 15.7 06/12/2014 1543   HCT 53.4 (H) 07/30/2018 1049   HCT 48.3 (H) 06/12/2014 1543   PLT 171 07/30/2018 1049   PLT 178 06/12/2014 1543   MCV 89.7 07/30/2018 1049   MCV 91 06/12/2014 1543   MCH 28.1 07/30/2018 1049   MCHC 31.3 07/30/2018 1049   RDW 13.2 07/30/2018 1049   RDW 14.8 (H) 06/12/2014 1543   LYMPHSABS 3.4 03/02/2018 1326   MONOABS 0.7 03/02/2018 1326   EOSABS 0.3 03/02/2018 1326   BASOSABS 0.1 03/02/2018 1326    BMET    Component Value Date/Time   NA 142 07/30/2018 1049   NA 142 06/12/2014 1543   K 3.9 07/30/2018 1049   K 3.6 06/12/2014 1543   CL 103  07/30/2018 1049   CL 105 06/12/2014 1543   CO2 31 07/30/2018 1049   CO2 29 06/12/2014 1543   GLUCOSE 128 (H) 07/30/2018 1049   GLUCOSE 113 (H) 06/12/2014 1543   BUN 8 07/30/2018 1049   BUN 12 06/12/2014 1543   CREATININE 0.51 07/30/2018 1049   CREATININE 0.62 06/12/2014 1543   CALCIUM 9.2 07/30/2018 1049   CALCIUM 8.9 06/12/2014 1543   GFRNONAA >60 07/30/2018 1049  GFRNONAA >60 06/12/2014 1543   GFRAA >60 07/30/2018 1049   GFRAA >60 06/12/2014 1543    BNP    Component Value Date/Time   BNP 70.0 01/11/2018 0821    ProBNP    Component Value Date/Time   PROBNP 485.2 (H) 02/01/2014 1300      Assessment & Plan:   COPD (chronic obstructive pulmonary disease) with chronic bronchitis (HCC) Assessment: Maintained on Trelegy Ellipta Current every day smoker, trying to quit, smoking 1 to 2 cigarettes a day Office spirometry in May/2018 shows an FEV1 of 42% Clear breath sounds today on exam Recently treated with azithromycin and prednisone telephonically mMRC 3 today Pending SARS-CoV-2 test from health department from 03/12/2019  Plan: We will await the results of the SARS-CoV-2 test from the health department Patient can notify our office after this is completed If patient is found to be negative then we can order baseline lab work as well as a chest x-ray will hold off on doing this today as exam is benign and help limit potential exposure throughout our office. Continue Trelegy Ellipta Follow-up in our office in 4 weeks Patient still needs to have formal pulmonary function testing completed  Chronic respiratory failure with hypoxia ([place]) Plan: Continue oxygen therapy as prescribed Need to maintain oxygen saturations greater than 90% Wear your oxygen with exertion as your oxygen saturations today were 87% on room air  Tobacco abuse Assessment: 63 year old female current every day smoker Currently smoking 1 to 2 cigarettes a day She is interested in stopping smoking  is using the reduced to quit method Has not set quit date 20-pack-year smoking history  Plan: Set quit date Follow-up with our office in 4 weeks You need to have pulmonary function testing     Return in about 4 weeks (around 04/16/2019), or if symptoms worsen or fail to improve, for Follow up with Wyn Quaker FNP-C.   Lauraine Rinne, NP 03/19/2019   This appointment was 34 minutes long with over 50% of the time in direct face-to-face patient care, assessment, plan of care, and follow-up.

## 2019-03-18 NOTE — Telephone Encounter (Signed)
Called patient and reminded she had abx and prednisone called in on 5/21. She was informed she would need 2 week f/u if not better. Pt has 6/4 appt scheduled for f/u in which she wanted to move up. She states she is no better. Appt moved up to 5/29. Nothing further needed.

## 2019-03-19 ENCOUNTER — Other Ambulatory Visit: Payer: Self-pay

## 2019-03-19 ENCOUNTER — Ambulatory Visit: Payer: No Typology Code available for payment source | Admitting: Pulmonary Disease

## 2019-03-19 ENCOUNTER — Encounter: Payer: Self-pay | Admitting: Pulmonary Disease

## 2019-03-19 ENCOUNTER — Ambulatory Visit (INDEPENDENT_AMBULATORY_CARE_PROVIDER_SITE_OTHER): Payer: No Typology Code available for payment source | Admitting: Pulmonary Disease

## 2019-03-19 ENCOUNTER — Other Ambulatory Visit: Payer: No Typology Code available for payment source

## 2019-03-19 VITALS — BP 118/52 | HR 68 | Temp 98.9°F | Ht 61.0 in | Wt 178.2 lb

## 2019-03-19 DIAGNOSIS — J9611 Chronic respiratory failure with hypoxia: Secondary | ICD-10-CM

## 2019-03-19 DIAGNOSIS — F17210 Nicotine dependence, cigarettes, uncomplicated: Secondary | ICD-10-CM

## 2019-03-19 DIAGNOSIS — J449 Chronic obstructive pulmonary disease, unspecified: Secondary | ICD-10-CM | POA: Diagnosis not present

## 2019-03-19 DIAGNOSIS — Z720 Tobacco use: Secondary | ICD-10-CM

## 2019-03-19 NOTE — Assessment & Plan Note (Signed)
Assessment: 63 year old female current every day smoker Currently smoking 1 to 2 cigarettes a day She is interested in stopping smoking is using the reduced to quit method Has not set quit date 20-pack-year smoking history  Plan: Set quit date Follow-up with our office in 4 weeks You need to have pulmonary function testing

## 2019-03-19 NOTE — Assessment & Plan Note (Signed)
Plan: Continue oxygen therapy as prescribed Need to maintain oxygen saturations greater than 90% Wear your oxygen with exertion as your oxygen saturations today were 87% on room air

## 2019-03-19 NOTE — Patient Instructions (Addendum)
Please notify us when you receive your results from SARS-CoV-2 testing  Can consider further work-up such as chest x-ray, baseline lab work and even empiric prednisone taper once we know that you are SARS-CoV-2 negative  Trelegy Ellipta  >>> 1 puff daily in the morning >>>rinse mouth out after use  >>> This inhaler contains 3 medications that help manage her respiratory status, contact our office if you cannot afford this medication or unable to remain on this medication   Note your daily symptoms > remember "red flags" for COPD:   >>>Increase in cough >>>increase in sputum production >>>increase in shortness of breath or activity  intolerance.   If you notice these symptoms, please call the office to be seen.    We recommend that you stop smoking.  >>>You need to set a quit date >>>If you have friends or family who smoke, let them know you are trying to quit and not to smoke around you or in your living environment  Smoking Cessation Resources:  1 800 QUIT NOW  >>> Patient to call this resource and utilize it to help support her quit smoking >>> Keep up your hard work with stopping smoking  You can also contact the [facility] >>>For smoking cessation classes call [phone]  We do not recommend using e-cigarettes as a form of stopping smoking  You can sign up for smoking cessation support texts and information:  >>>https://smokefree.gov/smokefreetxt   Return in about 4 weeks (around 04/16/2019), or if symptoms worsen or fail to improve, for Follow up with Wyn Quaker FNP-C.   Coronavirus (COVID-19) Are you at risk?  Are you at risk for the Coronavirus (COVID-19)?  To be considered HIGH RISK for Coronavirus (COVID-19), you have to meet the following criteria:  . Traveled to [place], [place], [place], [place] or [place]; or in the [place] to [place], [place], [place], or [place]; and have fever, cough, and shortness of breath within the last 2  weeks of travel OR . Been in close contact with a person diagnosed with COVID-19 within the last 2 weeks and have fever, cough, and shortness of breath . IF YOU DO NOT MEET THESE CRITERIA, YOU ARE CONSIDERED LOW RISK FOR COVID-19.  What to do if you are HIGH RISK for COVID-19?  Marland Kitchen If you are having a medical emergency, call 911. . Seek medical care right away. Before you go to a doctor's office, urgent care or emergency department, call ahead and tell them about your recent travel, contact with someone diagnosed with COVID-19, and your symptoms. You should receive instructions from your physician's office regarding next steps of care.  . When you arrive at healthcare provider, tell the healthcare staff immediately you have returned from visiting [place], [place], [place], [place] or [place]; or traveled in the [place] to [place], [place], [place], or [place]; in the last two weeks or you have been in close contact with a person diagnosed with COVID-19 in the last 2 weeks.   . Tell the health care staff about your symptoms: fever, cough and shortness of breath. . After you have been seen by a medical provider, you will be either: o Tested for (COVID-19) and discharged home on quarantine except to seek medical care if symptoms worsen, and asked to  - Stay home and avoid contact with others until you get your results (4-5 days)  - Avoid travel on public transportation if possible (such as bus, train, or airplane)  or o Sent to the Emergency Department by EMS for evaluation, COVID-19 testing, and possible admission depending on your condition and test results.  What to do if you are LOW RISK for COVID-19?  Reduce your risk of any infection by using the same precautions used for avoiding the common cold or flu:  Marland Kitchen Wash your hands often with soap and warm water for at least 20 seconds.  If soap and water are not readily available, use an alcohol-based hand sanitizer with at least 60% alcohol.   . If coughing or sneezing, cover your mouth and nose by coughing or sneezing into the elbow areas of your shirt or coat, into a tissue or into your sleeve (not your hands). . Avoid shaking hands with others and consider head nods or verbal greetings only. . Avoid touching your eyes, nose, or mouth with unwashed hands.  . Avoid close contact with people who are sick. . Avoid places or events with large numbers of people in one location, like concerts or sporting events. . Carefully consider travel plans you have or are making. . If you are planning any travel outside or inside the [place], visit the CDC's Travelers' Health webpage for the latest health notices. . If you have some symptoms but not all symptoms, continue to monitor at home and seek medical attention if your symptoms worsen. . If you are having a medical emergency, call 911.   [place] / e-Visit: [email or web address]         [facility]: [facility]: [phone]                   [facility]: [phone]           It is flu season:   >>> Best ways to protect herself from the flu: Receive the yearly flu vaccine, practice good hand hygiene washing with soap and also using hand sanitizer when available, eat a nutritious meals, get adequate rest, hydrate appropriately   Please contact the office if your symptoms worsen or you have concerns that you are not improving.   Thank you for choosing  [facility] for your healthcare, and for allowing [place] to partner with you on your healthcare journey. I am thankful to be able to provide care to you today.   Wyn Quaker FNP-C

## 2019-03-19 NOTE — Assessment & Plan Note (Signed)
Assessment: Maintained on Trelegy Ellipta Current every day smoker, trying to quit, smoking 1 to 2 cigarettes a day Office spirometry in May/2018 shows an FEV1 of 42% Clear breath sounds today on exam Recently treated with azithromycin and prednisone telephonically mMRC 3 today Pending SARS-CoV-2 test from health department from 03/12/2019  Plan: We will await the results of the SARS-CoV-2 test from the health department Patient can notify our office after this is completed If patient is found to be negative then we can order baseline lab work as well as a chest x-ray will hold off on doing this today as exam is benign and help limit potential exposure throughout our office. Continue Trelegy Ellipta Follow-up in our office in 4 weeks Patient still needs to have formal pulmonary function testing completed

## 2019-03-22 ENCOUNTER — Telehealth: Payer: Self-pay | Admitting: Pulmonary Disease

## 2019-03-22 NOTE — Telephone Encounter (Signed)
Patient was swabbed by the health department.  That is why we do not see the results in the system.  I would like for the patient to get a copy of these results faxed over to [place].  That way we can get these added to her chart.Once we have the printed lab results then we can proceed forward with some baseline lab work chest x-ray and prednisone taper.  Wyn Quaker FNP

## 2019-03-22 NOTE — Telephone Encounter (Signed)
Will close encounter since nothing further is needed.  

## 2019-03-22 NOTE — Telephone Encounter (Signed)
Called & spoke w/ pt. Pt last seen 03/19/2019 by BPM. According to AVS, Aaron Edelman wanted pt to be tested for SARS-CoV-2 before moving forward with further work-up such as CXR, baseline lab work, and even empiric prednisone taper.   Pt states she tested negative, however, I do not see the lab recorded in [place].   BPM, please advise. Have you received a printed lab report in regards to this test?

## 2019-03-22 NOTE — Telephone Encounter (Signed)
Thank you   [name]  

## 2019-03-22 NOTE — Telephone Encounter (Signed)
Called & spoke w/ pt. I let her know that she will need to have the health department fax her results over to the [place] office. Pt verbalized understanding. I have given her our fax number [phone]. Will await an incoming fax from the health department.

## 2019-03-23 ENCOUNTER — Telehealth: Payer: Self-pay | Admitting: Pulmonary Disease

## 2019-03-23 DIAGNOSIS — R0602 Shortness of breath: Secondary | ICD-10-CM

## 2019-03-23 NOTE — Addendum Note (Signed)
Addended by: Amado Coe on: 03/23/2019 04:25 PM   Modules accepted: Orders

## 2019-03-23 NOTE — Telephone Encounter (Signed)
Called asked patient if she needed to be seen otherwise per Brian's last office note she can just come in to have her previously scheduled chest xray and labs. Patient states she would just come in and we would call her with the results. There has been no change in her condition since we saw her on 03/19/19.  Nothing further needed.

## 2019-03-24 ENCOUNTER — Ambulatory Visit (INDEPENDENT_AMBULATORY_CARE_PROVIDER_SITE_OTHER): Payer: No Typology Code available for payment source

## 2019-03-24 ENCOUNTER — Other Ambulatory Visit (INDEPENDENT_AMBULATORY_CARE_PROVIDER_SITE_OTHER): Payer: No Typology Code available for payment source

## 2019-03-24 ENCOUNTER — Ambulatory Visit: Payer: No Typology Code available for payment source | Admitting: Pulmonary Disease

## 2019-03-24 DIAGNOSIS — R0602 Shortness of breath: Secondary | ICD-10-CM

## 2019-03-24 LAB — COMPREHENSIVE METABOLIC PANEL
ALT: 61 U/L — ABNORMAL HIGH (ref 0–35)
AST: 44 U/L — ABNORMAL HIGH (ref 0–37)
Albumin: 4.2 g/dL (ref 3.5–5.2)
Alkaline Phosphatase: 80 U/L (ref 39–117)
BUN: 12 mg/dL (ref 6–23)
CO2: 40 mEq/L — ABNORMAL HIGH (ref 19–32)
Calcium: 9.6 mg/dL (ref 8.4–10.5)
Chloride: 95 mEq/L — ABNORMAL LOW (ref 96–112)
Creatinine, Ser: 0.66 mg/dL (ref 0.40–1.20)
GFR: 90.36 mL/min (ref >60.00)
Glucose, Bld: 117 mg/dL — ABNORMAL HIGH (ref 70–99)
Potassium: 4 mEq/L (ref 3.5–5.1)
Sodium: 141 mEq/L (ref 135–145)
Total Bilirubin: 0.5 mg/dL (ref 0.2–1.2)
Total Protein: 7 g/dL (ref 6.0–8.3)

## 2019-03-24 LAB — CBC WITH DIFFERENTIAL/PLATELET
Basophils Absolute: 0.1 10*3/uL (ref 0.0–0.1)
Basophils Relative: 0.7 % (ref 0.0–3.0)
Eosinophils Absolute: 0.3 10*3/uL (ref 0.0–0.7)
Eosinophils Relative: 3.6 % (ref 0.0–5.0)
HCT: 48.3 % — ABNORMAL HIGH (ref 36.0–46.0)
Hemoglobin: 16 g/dL — ABNORMAL HIGH (ref 12.0–15.0)
Lymphocytes Relative: 25.4 % (ref 12.0–46.0)
Lymphs Abs: 2.2 10*3/uL (ref 0.7–4.0)
MCHC: 33.1 g/dL (ref 30.0–36.0)
MCV: 90.8 fl (ref 78.0–100.0)
Monocytes Absolute: 0.7 10*3/uL (ref 0.1–1.0)
Monocytes Relative: 8.6 % (ref 3.0–12.0)
Neutro Abs: 5.3 10*3/uL (ref 1.4–7.7)
Neutrophils Relative %: 61.7 % (ref 43.0–77.0)
Platelets: 167 10*3/uL (ref 150.0–400.0)
RBC: 5.32 Mil/uL — ABNORMAL HIGH (ref 3.87–5.11)
RDW: 13.6 % (ref 11.5–15.5)
WBC: 8.6 10*3/uL (ref 4.0–10.5)

## 2019-03-24 LAB — BRAIN NATRIURETIC PEPTIDE: Pro B Natriuretic peptide (BNP): 45 pg/mL (ref 0.0–100.0)

## 2019-03-25 ENCOUNTER — Telehealth: Payer: Self-pay | Admitting: Pulmonary Disease

## 2019-03-25 ENCOUNTER — Ambulatory Visit: Payer: No Typology Code available for payment source | Admitting: Pulmonary Disease

## 2019-03-25 MED ORDER — PREDNISONE 10 MG PO TABS: ORAL_TABLET | ORAL | 0 refills | Status: DC

## 2019-03-25 NOTE — Progress Notes (Signed)
No acute signs of infection.  Liver functioning is up slightly especially in comparison to where you were a year ago.  I would limit Tylenol use if you have been using that.  Avoid alcohol if you have been drinking.  Can place order for:   Prednisone 10mg  tablet  >>>4 tabs for 3 days, then 3 tabs for 3 days, 2 tabs for 3 days, then 1 tab for 3 days, then stop >>>take with food  >>>take in the morning    Wyn Quaker FNP

## 2019-03-25 NOTE — Telephone Encounter (Signed)
Primary Pulmonologist: RA Last office visit and with whom: 03/19/2019 with Wyn Quaker What do we see them for (pulmonary problems): COPD Last OV assessment/plan: Assessment & Plan Note by Lauraine Rinne, NP at 03/19/2019 11:18 AM  Author: Lauraine Rinne, NP Author Type: Nurse Practitioner Filed: 03/19/2019 11:18 AM  Note Status: Written Cosign: Cosign Not Required Encounter Date: 03/19/2019  Problem: Tobacco abuse  Editor: Lauraine Rinne, NP (Nurse Practitioner)    Assessment: 63 year old female current every day smoker Currently smoking 1 to 2 cigarettes a day She is interested in stopping smoking is using the reduced to quit method Has not set quit date 20-pack-year smoking history  Plan: Set quit date Follow-up with our office in 4 weeks You need to have pulmonary function testing     Assessment & Plan Note by Lauraine Rinne, NP at 03/19/2019 11:18 AM  Author: Lauraine Rinne, NP Author Type: Nurse Practitioner Filed: 03/19/2019 11:18 AM  Note Status: Written Cosign: Cosign Not Required Encounter Date: 03/19/2019  Problem: Chronic respiratory failure with hypoxia [facility])  Editor: Lauraine Rinne, NP (Nurse Practitioner)    Plan: Continue oxygen therapy as prescribed Need to maintain oxygen saturations greater than 90% Wear your oxygen with exertion as your oxygen saturations today were 87% on room air    Assessment & Plan Note by Lauraine Rinne, NP at 03/19/2019 11:16 AM  Author: Lauraine Rinne, NP Author Type: Nurse Practitioner Filed: 03/19/2019 11:17 AM  Note Status: Written Cosign: Cosign Not Required Encounter Date: 03/19/2019  Problem: COPD (chronic obstructive pulmonary disease) with chronic bronchitis ([place])  Editor: Lauraine Rinne, NP (Nurse Practitioner)    Assessment: Maintained on Trelegy Ellipta Current every day smoker, trying to quit, smoking 1 to 2 cigarettes a day Office spirometry in May/2018 shows an FEV1 of 42% Clear breath sounds today on exam Recently treated with  azithromycin and prednisone telephonically mMRC 3 today Pending SARS-CoV-2 test from health department from 03/12/2019  Plan: We will await the results of the SARS-CoV-2 test from the health department Patient can notify our office after this is completed If patient is found to be negative then we can order baseline lab work as well as a chest x-ray will hold off on doing this today as exam is benign and help limit potential exposure throughout our office. Continue Trelegy Ellipta Follow-up in our office in 4 weeks Patient still needs to have formal pulmonary function testing completed    Patient Instructions by Lauraine Rinne, NP at 03/19/2019 9:00 AM  Author: Lauraine Rinne, NP Author Type: Nurse Practitioner Filed: 03/19/2019 9:31 AM  Note Status: Addendum Cosign: Cosign Not Required Encounter Date: 03/19/2019  Editor: Lauraine Rinne, NP (Nurse Practitioner)  Prior Versions: 1. Lauraine Rinne, NP (Nurse Practitioner) at 03/19/2019 9:30 AM - Addendum   2. Lauraine Rinne, NP (Nurse Practitioner) at 03/19/2019 9:29 AM - Signed    Please notify us when you receive your results from SARS-CoV-2 testing  Can consider further work-up such as chest x-ray, baseline lab work and even empiric prednisone taper once we know that you are SARS-CoV-2 negative  Trelegy Ellipta  >>> 1 puff daily in the morning >>>rinse mouth out after use  >>> This inhaler contains 3 medications that help manage her respiratory status, contact our office if you cannot afford this medication or unable to remain on this medication   Note your daily symptoms >remember "red flags" for COPD:  >>>Increase in cough >>>increase  in sputum production >>>increase in shortness of breath or activity  intolerance.   If you notice these symptoms, please call the office to be seen.      Was appointment offered to patient (explain)?  Pt wants prednisone to be sent to pharmacy   Reason for call: Called and spoke with pt and  pt stated she had labwork performed yesterday 6/3 and based on this, pt was told by Aaron Edelman that pt could have another Rx for prednisone if she was still having problems with her breathing. Pt states she would like the Rx to be called. Aaron Edelman, please advise if you are fine with another prednisone Rx called into pharmacy for pt. Thanks!

## 2019-03-25 NOTE — Telephone Encounter (Signed)
Please review the chart. I have already released these results an instructed another pred taper to be prescribed. Note is in [facility]. We have been busy seeing patients. Patient needs to understand that it has been less than 24 hours since I have even had these results resulted.   [name]

## 2019-03-25 NOTE — Telephone Encounter (Signed)
Notes recorded by Lauraine Rinne, NP on 03/25/2019 at 8:45 AM EDT No acute signs of infection. Liver functioning is up slightly especially in comparison to where you were a year ago. I would limit Tylenol use if you have been using that. Avoid alcohol if you have been drinking.  Can place order for:   Prednisone 10mg  tablet  >>>4 tabs for 3 days, then 3 tabs for 3 days, 2 tabs for 3 days, then 1 tab for 3 days, then stop >>>take with food  >>>take in the morning   Notes recorded by Lauraine Rinne, NP on 03/24/2019 at 4:38 PM EDT No acute changes. No changes in plan of care.   Ferdinand Lango and spoke with pt in regards to the results of labwork and cxr and stated to her that Aaron Edelman said for [place] to send in another pred taper and pt verbalized understanding. Verified pt's preferred pharmacy and sent Rx in for pt. Nothing further needed.

## 2019-04-26 ENCOUNTER — Other Ambulatory Visit: Payer: Self-pay | Admitting: Pulmonary Disease

## 2019-04-29 ENCOUNTER — Other Ambulatory Visit (HOSPITAL_COMMUNITY)
Admission: RE | Admit: 2019-04-29 | Discharge: 2019-04-29 | Disposition: A | Discharge status: Home / Self Care | Payer: No Typology Code available for payment source | Source: Ambulatory Visit | Attending: Pulmonary Disease | Admitting: Pulmonary Disease

## 2019-04-29 DIAGNOSIS — Z01812 Encounter for preprocedural laboratory examination: Secondary | ICD-10-CM | POA: Diagnosis not present

## 2019-04-29 DIAGNOSIS — Z1159 Encounter for screening for other viral diseases: Secondary | ICD-10-CM | POA: Insufficient documentation

## 2019-04-30 LAB — SARS CORONAVIRUS 2 (TAT 6-24 HRS): SARS Coronavirus 2: NEGATIVE

## 2019-05-02 NOTE — Progress Notes (Signed)
@Patient  ID: [name], female    DOB: 1956/09/14, 63 y.o.   MRN: [id number]  Chief Complaint  Patient presents with  . Follow-up    PFT today, doing ok     Referring provider: Biagio Borg, MD  HPI:  63 year old female former smoker followed in our office for chronic GOLD COPD IV  PMH: GERD Smoker/ Smoking History: Current Smoker. 1-2 cigarettes. 23-pack-year. Maintenance: Trelegy Ellipta  Pt of: Dr. Elsworth Soho  05/03/2019  - Visit   63 year old female current every day smoker smoking 1 to 4 cigarettes daily followed in our office for COPD Gold stage IV.  Patient completed pulmonary function test today.  Pulmonary function test listed below:  05/03/2019-pulmonary function test- FVC 1.05 (44% predicted), postbronchodilator ratio 47, postbronchodilator FEV1 0.47 (25% predicted), DLCO 54  Patient reports that her breathing is at baseline.  Patient is maintained on Trelegy Ellipta inhaler.  Patient also uses oxygen with physical exertion.  Patient is showing due for a Pneumovax today.  MMRC - Breathlessness Score 3 - I stop for breath after walking about 100 yards or after a few minutes on level ground (isle at grocery store is 176ft)     Tests:   03/07/2017-spirometry- severe airway obstruction FEV1 42  05/03/2019-pulmonary function test- FVC 1.05 (44% predicted), postbronchodilator ratio 47, postbronchodilator FEV1 0.47 (25% predicted), DLCO 54  Imaging:   01/11/18-chest x-ray-improved cardiomegaly and pulmonary vascular congestion, stable mild changes of COPD  12/09/2016-CT Angio- no radiographic evidence of pulmonary embolism   Cardiac:  02/09/2017-echocardiogram LV ejection fraction 60 to 62%, grade 2 diastolic dysfunction   FENO:  No results found for: NITRICOXIDE  PFT: PFT Results Latest Ref Rng & Units 05/03/2019  FVC-Pre L 1.05  FVC-Predicted Pre % 44  FVC-Post L 1.00  FVC-Predicted Post % 42  Pre FEV1/FVC % % 49  Post FEV1/FCV % % 47  FEV1-Pre L 0.52   FEV1-Predicted Pre % 28  FEV1-Post L 0.47  DLCO UNC% % 54  DLCO COR %Predicted % 87  TLC L 3.81  TLC % Predicted % 80  RV % Predicted % 136    Imaging: No results found.    Specialty Problems      Pulmonary Problems   COPD (chronic obstructive pulmonary disease) with chronic bronchitis (HCC)    03/07/2017-spirometry- severe airway obstruction FEV1 42  01/11/18-chest x-ray-improved cardiomegaly and pulmonary vascular congestion, stable mild changes of COPD  05/03/2019-pulmonary function test- FVC 1.05 (44% predicted), postbronchodilator ratio 47, postbronchodilator FEV1 0.47 (25% predicted), DLCO 54       Allergic rhinitis   5 mm Lung nodule, solitary   Nocturnal hypoxemia   Cough   Sinusitis, acute   COPD exacerbation (HCC)   Chronic respiratory failure with hypoxia (HCC)      Allergies  Allergen Reactions  . Augmentin [Amoxicillin-Pot Clavulanate] Nausea And Vomiting    Immunization History  Administered Date(s) Administered  . Influenza Split 07/08/2013  . Influenza,inj,Quad PF,6+ Mos 07/31/2015, 09/03/2017  . Pneumococcal Polysaccharide-23 02/02/2014, 05/03/2019   Pneumovax23 today    Past Medical History:  Diagnosis Date  . Bronchitis   . CAD (coronary artery disease)    a. 02/2002: CABG x3 with LIMA to LAD, RIMA to RCA, and SVG to OM  . CHF (congestive heart failure) ([place])   . COPD (chronic obstructive pulmonary disease) ([place])   . Headache   . Heart disease   . Hyperlipidemia   . Hypertension   . Impaired glucose tolerance 08/20/2014  .  Sinusitis     Tobacco History: Social History   Tobacco Use  Smoking Status Current Every Day Smoker  . Packs/day: 0.25  . Years: 47.00  . Pack years: 11.75  . Types: Cigarettes  . Start date: 12/29/1971  Smokeless Tobacco Never Used   Ready to quit: No Counseling given: Yes   Smoking assessment and cessation counseling  Patient currently smoking: 1 to 4 cigarettes a day I have advised the patient to  quit/stop smoking as soon as possible due to high risk for multiple medical problems.  It will also be very difficult for [place] to manage patient's  respiratory symptoms and status if we continue to expose her lungs to a known irritant.  We do not advise e-cigarettes as a form of stopping smoking.  Patient is not willing to quit smoking.  I have advised the patient that we can assist and have options of nicotine replacement therapy, provided smoking cessation education today, provided smoking cessation counseling, and provided cessation resources.  Has used nicotine patches in the past with no success.  Is interested in trying Chantix but insurance does not pay for it.  Follow-up next office visit office visit for assessment of smoking cessation.    Smoking cessation counseling advised for: 51min    Outpatient Encounter Medications as of 05/03/2019  Medication Sig  . acetaminophen (TYLENOL) 500 MG tablet Take 1,000 mg by mouth at bedtime as needed for headache.  . albuterol (PROVENTIL HFA) 108 (90 Base) MCG/ACT inhaler INHALE 1-2 PUFFS INTO THE LUNGS EVERY 6 (SIX) HOURS AS NEEDED FOR WHEEZING OR SHORTNESS OF BREATH.  Marland Kitchen amLODipine (NORVASC) 5 MG tablet TAKE 1 TABLET BY MOUTH EVERY DAY  . carvedilol (COREG) 12.5 MG tablet TAKE 1 TABLET (12.5 MG TOTAL) BY MOUTH 2 (TWO) TIMES DAILY.  . cholecalciferol (VITAMIN D) 1000 units tablet Take 1,000 Units by mouth daily.  Marland Kitchen diltiazem (CARDIZEM) 30 MG tablet Take 1 tablet (30 mg total) by mouth every 6 (six) hours as needed.  . Fluticasone-Umeclidin-Vilant (TRELEGY ELLIPTA) 100-62.5-25 MCG/INH AEPB Inhale 1 puff into the lungs daily.  . furosemide (LASIX) 40 MG tablet Take 20 mg by mouth daily.  Marland Kitchen ipratropium-albuterol (DUONEB) 0.5-2.5 (3) MG/3ML SOLN TAKE 3 MLS BY NEBULIZATION EVERY 6 (SIX) HOURS AS NEEDED.  Marland Kitchen isosorbide mononitrate (IMDUR) 60 MG 24 hr tablet TAKE 1 & 1/2 TABLET BY MOUTH DAILY  . potassium chloride (K-DUR) 10 MEQ tablet Take 5 mEq by mouth  daily.  . vitamin B-12 (CYANOCOBALAMIN) 1000 MCG tablet Take 1,000 mcg by mouth daily.  . vitamin C (ASCORBIC ACID) 500 MG tablet Take 1,000 mg by mouth daily.  . Fluticasone-Umeclidin-Vilant (TRELEGY ELLIPTA) 100-62.5-25 MCG/INH AEPB Inhale 1 puff into the lungs daily.  . [DISCONTINUED] predniSONE (DELTASONE) 10 MG tablet Take 4tabsx3days,3tabsx3days,2tabsx3days,1tabx3days,then stop   No facility-administered encounter medications on file as of 05/03/2019.      Review of Systems  Review of Systems  Constitutional: Positive for fatigue (at baseline ). Negative for activity change and fever.  HENT: Negative for sinus pressure, sinus pain and sore throat.   Respiratory: Positive for shortness of breath (at baseline ) and wheezing (very little ). Negative for cough.   Cardiovascular: Positive for chest pain (every once and a while, followed by cards) and palpitations (followed by cards ).  Gastrointestinal: Negative for diarrhea, nausea and vomiting.  Musculoskeletal: Negative for arthralgias.  Neurological: Negative for dizziness.  Psychiatric/Behavioral: Negative for sleep disturbance. The patient is not nervous/anxious.  Physical Exam  BP 118/72 (BP Location: Left Arm, Cuff Size: Normal)   Pulse 60   Ht 5\' 2"  (1.575 m)   Wt 177 lb 9.6 oz (80.6 kg)   SpO2 96%   BMI 32.48 kg/m   Wt Readings from Last 5 Encounters:  05/03/19 177 lb 9.6 oz (80.6 kg)  03/19/19 178 lb 3.2 oz (80.8 kg)  03/02/19 180 lb 3.2 oz (81.7 kg)  12/14/18 175 lb (79.4 kg)  11/09/18 173 lb (78.5 kg)     Physical Exam Vitals signs and nursing note reviewed.  Constitutional:      General: She is not in acute distress. HENT:     Head: Normocephalic and atraumatic.     Right Ear: Tympanic membrane, ear canal and external ear normal. There is no impacted cerumen.     Left Ear: Tympanic membrane, ear canal and external ear normal. There is no impacted cerumen.     Nose: Nose normal. No congestion.      Mouth/Throat:     Mouth: Mucous membranes are moist.     Pharynx: Oropharynx is clear.  Eyes:     Pupils: Pupils are equal, round, and reactive to light.  Neck:     Musculoskeletal: Normal range of motion.  Cardiovascular:     Rate and Rhythm: Normal rate and regular rhythm.     Pulses: Normal pulses.     Heart sounds: Normal heart sounds. No murmur.  Pulmonary:     Effort: Pulmonary effort is normal. No respiratory distress.     Breath sounds: No decreased air movement. Decreased breath sounds (Throughout entire exam) present. No wheezing or rales.  Skin:    General: Skin is warm and dry.     Capillary Refill: Capillary refill takes less than 2 seconds.  Neurological:     General: No focal deficit present.     Mental Status: She is alert and oriented to person, place, and time. Mental status is at baseline.     Gait: Gait normal.  Psychiatric:        Mood and Affect: Mood normal.        Behavior: Behavior normal.        Thought Content: Thought content normal.        Judgment: Judgment normal.      Lab Results:  CBC    Component Value Date/Time   WBC 8.6 03/24/2019 1213   RBC 5.32 (H) 03/24/2019 1213   HGB 16.0 (H) 03/24/2019 1213   HGB 15.7 06/12/2014 1543   HCT 48.3 (H) 03/24/2019 1213   HCT 48.3 (H) 06/12/2014 1543   PLT 167.0 03/24/2019 1213   PLT 178 06/12/2014 1543   MCV 90.8 03/24/2019 1213   MCV 91 06/12/2014 1543   MCH 28.1 07/30/2018 1049   MCHC 33.1 03/24/2019 1213   RDW 13.6 03/24/2019 1213   RDW 14.8 (H) 06/12/2014 1543   LYMPHSABS 2.2 03/24/2019 1213   MONOABS 0.7 03/24/2019 1213   EOSABS 0.3 03/24/2019 1213   BASOSABS 0.1 03/24/2019 1213    BMET    Component Value Date/Time   NA 141 03/24/2019 1213   NA 142 06/12/2014 1543   K 4.0 03/24/2019 1213   K 3.6 06/12/2014 1543   CL 95 (L) 03/24/2019 1213   CL 105 06/12/2014 1543   CO2 40 (H) 03/24/2019 1213   CO2 29 06/12/2014 1543   GLUCOSE 117 (H) 03/24/2019 1213   GLUCOSE 113 (H)  06/12/2014 1543   BUN 12 03/24/2019  1213   BUN 12 06/12/2014 1543   CREATININE 0.66 03/24/2019 1213   CREATININE 0.62 06/12/2014 1543   CALCIUM 9.6 03/24/2019 1213   CALCIUM 8.9 06/12/2014 1543   GFRNONAA >60 07/30/2018 1049   GFRNONAA >60 06/12/2014 1543   GFRAA >60 07/30/2018 1049   GFRAA >60 06/12/2014 1543    BNP    Component Value Date/Time   BNP 70.0 01/11/2018 0821    ProBNP    Component Value Date/Time   PROBNP 45.0 03/24/2019 1213      Assessment & Plan:   COPD (chronic obstructive pulmonary disease) with chronic bronchitis (HCC) Assessment: Maintained on Trelegy Ellipta Current every day smoker, trying to quit smoking 1 to 4 cigarettes a day, July/2020 pulmonary function test showing COPD Gold 4, FEV1 0.47 (25% predicted), DLCO 54 mMRC 3 today Clear breath sounds on exam today, diminished throughout entire exam  Plan: Continue Trelegy Ellipta Signed handicap placard today Continue rescue inhaler as needed Continue to try to be as active as possible Follow-up with our office in 3 months  Chronic respiratory failure with hypoxia ([place]) Plan: Continue oxygen therapy as prescribed Maintain oxygen levels greater than 90% Wear your oxygen with exertion  Tobacco abuse Assessment: Current everyday smoker Currently smoking 1 to 4 cigarettes a day Patient is interested in stopping smoking but feels that she cannot at this time Has not set quit date 23-pack-year smoking history  Plan: We recommend that you stop smoking    Return in about 3 months (around 08/03/2019), or if symptoms worsen or fail to improve, for Follow up with Dr. Elsworth Soho.   Lauraine Rinne, NP 05/03/2019   This appointment was 28 minutes long with over 50% of the time in direct face-to-face patient care, assessment, plan of care, and follow-up.

## 2019-05-03 ENCOUNTER — Ambulatory Visit (INDEPENDENT_AMBULATORY_CARE_PROVIDER_SITE_OTHER): Payer: No Typology Code available for payment source | Admitting: Pulmonary Disease

## 2019-05-03 ENCOUNTER — Other Ambulatory Visit: Payer: Self-pay

## 2019-05-03 ENCOUNTER — Encounter: Payer: Self-pay | Admitting: Pulmonary Disease

## 2019-05-03 ENCOUNTER — Ambulatory Visit: Payer: No Typology Code available for payment source | Admitting: Pulmonary Disease

## 2019-05-03 ENCOUNTER — Other Ambulatory Visit: Payer: Self-pay | Admitting: *Deleted

## 2019-05-03 VITALS — BP 118/72 | HR 60 | Ht 62.0 in | Wt 177.6 lb

## 2019-05-03 DIAGNOSIS — J449 Chronic obstructive pulmonary disease, unspecified: Secondary | ICD-10-CM

## 2019-05-03 DIAGNOSIS — F17210 Nicotine dependence, cigarettes, uncomplicated: Secondary | ICD-10-CM

## 2019-05-03 DIAGNOSIS — J9611 Chronic respiratory failure with hypoxia: Secondary | ICD-10-CM

## 2019-05-03 DIAGNOSIS — Z23 Encounter for immunization: Secondary | ICD-10-CM | POA: Diagnosis not present

## 2019-05-03 DIAGNOSIS — Z720 Tobacco use: Secondary | ICD-10-CM

## 2019-05-03 LAB — PULMONARY FUNCTION TEST
DL/VA % pred: 87 %
DL/VA: 3.74 ml/min/mmHg/L
DLCO cor % pred: 50 %
DLCO cor: 9.48 ml/min/mmHg
DLCO unc % pred: 54 %
DLCO unc: 10.16 ml/min/mmHg
FEF 25-75 Post: 0.18 L/sec
FEF 25-75 Pre: 0.22 L/sec
FEF2575-%Change-Post: -15 %
FEF2575-%Pred-Post: 10 %
FEF2575-%Pred-Pre: 11 %
FEV1-%Change-Post: -9 %
FEV1-%Pred-Post: 25 %
FEV1-%Pred-Pre: 28 %
FEV1-Post: 0.47 L
FEV1-Pre: 0.52 L
FEV1FVC-%Change-Post: -5 %
FEV1FVC-%Pred-Pre: 62 %
FEV6-%Change-Post: -7 %
FEV6-%Pred-Post: 42 %
FEV6-%Pred-Pre: 45 %
FEV6-Post: 0.96 L
FEV6-Pre: 1.04 L
FEV6FVC-%Change-Post: -1 %
FEV6FVC-%Pred-Post: 100 %
FEV6FVC-%Pred-Pre: 102 %
FVC-%Change-Post: -4 %
FVC-%Pred-Post: 42 %
FVC-%Pred-Pre: 44 %
FVC-Post: 1 L
FVC-Pre: 1.05 L
Post FEV1/FVC ratio: 47 %
Post FEV6/FVC ratio: 97 %
Pre FEV1/FVC ratio: 49 %
Pre FEV6/FVC Ratio: 99 %
RV % pred: 136 %
RV: 2.66 L
TLC % pred: 80 %
TLC: 3.81 L

## 2019-05-03 MED ORDER — TRELEGY ELLIPTA 100-62.5-25 MCG/INH IN AEPB: 1.0000 | INHALATION_SPRAY | Freq: Every day | RESPIRATORY_TRACT | 0 refills | Status: DC

## 2019-05-03 NOTE — Patient Instructions (Addendum)
Pneumovax23 today   Handicap placard signed today  Continue  Trelegy Ellipta  >>> 1 puff daily in the morning >>>rinse mouth out after use  >>> This inhaler contains 3 medications that help manage her respiratory status, contact our office if you cannot afford this medication or unable to remain on this medication   Note your daily symptoms >remember "red flags" for COPD:  >>>Increase in cough >>>increase in sputum production >>>increase in shortness of breath or activity  intolerance.   If you notice these symptoms, please call the office to be seen.    We recommend that you stop smoking.  >>>You need to set a quit date >>>If you have friends or family who smoke, let them know you are trying to quit and not to smoke around you or in your living environment  Smoking Cessation Resources:  1 800 QUIT NOW  >>> Patient to call this resource and utilize it to help support her quit smoking >>> Keep up your hard work with stopping smoking  You can also contact the [facility] >>>For smoking cessation classes call [phone]  We do not recommend using e-cigarettes as a form of stopping smoking  You can sign up for smoking cessation support texts and information:  >>>https://smokefree.gov/smokefreetxt    Return in about 3 months (around 08/03/2019), or if symptoms worsen or fail to improve, for Follow up with Dr. Elsworth Soho.    Pneumococcal Vaccine, Polyvalent solution for injection What is this medicine? PNEUMOCOCCAL VACCINE, POLYVALENT (NEU mo KOK al vak SEEN, pol ee VEY luhnt) is a vaccine to prevent pneumococcus bacteria infection. These bacteria are a major cause of ear infections, Strep throat infections, and serious pneumonia, meningitis, or blood infections worldwide. These vaccines help the body to produce antibodies (protective substances) that help your body defend against these bacteria. This vaccine is recommended for people 31 years of age and older  with health problems. It is also recommended for all adults over 2 years old. This vaccine will not treat an infection. This medicine may be used for other purposes; ask your health care provider or pharmacist if you have questions. COMMON BRAND NAME(S): Pneumovax 23 What should I tell my health care provider before I take this medicine? They need to know if you have any of these conditions:  bleeding problems  bone marrow or organ transplant  cancer, Hodgkin's disease  fever  infection  immune system problems  low platelet count in the blood  seizures  an unusual or allergic reaction to pneumococcal vaccine, diphtheria toxoid, other vaccines, latex, other medicines, foods, dyes, or preservatives  pregnant or trying to get pregnant  breast-feeding How should I use this medicine? This vaccine is for injection into a muscle or under the skin. It is given by a health care professional. A copy of Vaccine Information Statements will be given before each vaccination. Read this sheet carefully each time. The sheet may change frequently. Talk to your pediatrician regarding the use of this medicine in children. While this drug may be prescribed for children as young as 48 years of age for selected conditions, precautions do apply. Overdosage: If you think you have taken too much of this medicine contact a poison control center or emergency room at once. NOTE: This medicine is only for you. Do not share this medicine with others. What if I miss a dose? It is important not to miss your dose. Call your doctor or health care professional if you are unable to keep  an appointment. What may interact with this medicine?  medicines for cancer chemotherapy  medicines that suppress your immune function  medicines that treat or prevent blood clots like warfarin, enoxaparin, and dalteparin  steroid medicines like prednisone or cortisone This list may not describe all possible interactions. Give  your health care provider a list of all the medicines, herbs, non-prescription drugs, or dietary supplements you use. Also tell them if you smoke, drink alcohol, or use illegal drugs. Some items may interact with your medicine. What should I watch for while using this medicine? Mild fever and pain should go away in 3 days or less. Report any unusual symptoms to your doctor or health care professional. What side effects may I notice from receiving this medicine? Side effects that you should report to your doctor or health care professional as soon as possible:  allergic reactions like skin rash, itching or hives, swelling of the face, lips, or tongue  breathing problems  confused  fever over 102 degrees F  pain, tingling, numbness in the hands or feet  seizures  unusual bleeding or bruising  unusual muscle weakness Side effects that usually do not require medical attention (report to your doctor or health care professional if they continue or are bothersome):  aches and pains  diarrhea  fever of 102 degrees F or less  headache  irritable  loss of appetite  pain, tender at site where injected  trouble sleeping This list may not describe all possible side effects. Call your doctor for medical advice about side effects. You may report side effects to FDA at 1-800-FDA-1088. Where should I keep my medicine? This does not apply. This vaccine is given in a clinic, pharmacy, doctor's office, or other health care setting and will not be stored at home. NOTE: This sheet is a summary. It may not cover all possible information. If you have questions about this medicine, talk to your doctor, pharmacist, or health care provider.  2020 Elsevier/Gold Standard (2008-05-13 14:32:37)     Health Risks of Smoking Smoking cigarettes is very bad for your health. Tobacco smoke has over 200 known poisons in it. It contains the poisonous gases nitrogen oxide and carbon monoxide. There are over  60 chemicals in tobacco smoke that cause cancer. Smoking is difficult to quit because a chemical in tobacco, called nicotine, causes addiction or dependence. When you smoke and inhale, nicotine is absorbed rapidly into the bloodstream through your lungs. Both inhaled and non-inhaled nicotine may be addictive. What are the risks of cigarette smoke? Cigarette smokers have an increased risk of many serious medical problems, including:  Lung cancer.  Lung disease, such as pneumonia, bronchitis, and emphysema.  Chest pain (angina) and heart attack because the heart is not getting enough oxygen.  Heart disease and peripheral blood vessel disease.  High blood pressure (hypertension).  Stroke.  Oral cancer, including cancer of the lip, mouth, or voice box.  Bladder cancer.  Pancreatic cancer.  Cervical cancer.  Pregnancy complications, including premature birth.  Stillbirths and smaller newborn babies, birth defects, and genetic damage to sperm.  Early menopause.  Lower estrogen level for women.  Infertility.  Facial wrinkles.  Blindness.  Increased risk of broken bones (fractures).  Senile dementia.  Stomach ulcers and internal bleeding.  Delayed wound healing and increased risk of complications during surgery.  Even smoking lightly shortens your life expectancy by several years. Because of secondhand smoke exposure, children of smokers have an increased risk of the following:  Sudden  infant death syndrome (SIDS).  Respiratory infections.  Lung cancer.  Heart disease.  Ear infections. What are the benefits of quitting? There are many health benefits of quitting smoking. Here are some of them:  Within days of quitting smoking, your risk of having a heart attack decreases, your blood flow improves, and your lung capacity improves. Blood pressure, pulse rate, and breathing patterns start returning to normal soon after quitting.  Within months, your lungs may  clear up completely.  Quitting for 10 years reduces your risk of developing lung cancer and heart disease to almost that of a nonsmoker.  People who quit may see an improvement in their overall quality of life. How do I quit smoking?     Smoking is an addiction with both physical and psychological effects, and longtime habits can be hard to change. Your health care provider can recommend:  Programs and community resources, which may include group support, education, or talk therapy.  Prescription medicines to help reduce cravings.  Nicotine replacement products, such as patches, gum, and nasal sprays. Use these products only as directed. Do not replace cigarette smoking with electronic cigarettes, which are commonly called e-cigarettes. The safety of e-cigarettes is not known, and some may contain harmful chemicals.  A combination of two or more of these methods. Where to find more information  American Lung Association: www.lung.org  American Cancer Society: www.cancer.org Summary  Smoking cigarettes is very bad for your health. Cigarette smokers have an increased risk of many serious medical problems, including several cancers, heart disease, and stroke.  Smoking is an addiction with both physical and psychological effects, and longtime habits can be hard to change.  By stopping right away, you can greatly reduce the risk of medical problems for you and your family.  To help you quit smoking, your health care provider can recommend programs, community resources, prescription medicines, and nicotine replacement products such as patches, gum, and nasal sprays. This information is not intended to replace advice given to you by your health care provider. Make sure you discuss any questions you have with your health care provider. Document Released: 11/14/2004 Document Revised: 01/08/2018 Document Reviewed: 10/11/2016 Elsevier Patient Education  2020 [place].     Chronic  Obstructive Pulmonary Disease Chronic obstructive pulmonary disease (COPD) is a long-term (chronic) lung problem. When you have COPD, it is hard for air to get in and out of your lungs. Usually the condition gets worse over time, and your lungs will never return to normal. There are things you can do to keep yourself as healthy as possible.  Your doctor may treat your condition with: ? Medicines. ? Oxygen. ? Lung surgery.  Your doctor may also recommend: ? Rehabilitation. This includes steps to make your body work better. It may involve a team of specialists. ? Quitting smoking, if you smoke. ? Exercise and changes to your diet. ? Comfort measures (palliative care). Follow these instructions at home: Medicines  Take over-the-counter and prescription medicines only as told by your doctor.  Talk to your doctor before taking any cough or allergy medicines. You may need to avoid medicines that cause your lungs to be dry. Lifestyle  If you smoke, stop. Smoking makes the problem worse. If you need help quitting, ask your doctor.  Avoid being around things that make your breathing worse. This may include smoke, chemicals, and fumes.  Stay active, but remember to rest as well.  Learn and use tips on how to relax.  Make sure  you get enough sleep. Most adults need at least 7 hours of sleep every night.  Eat healthy foods. Eat smaller meals more often. Rest before meals. Controlled breathing Learn and use tips on how to control your breathing as told by your doctor. Try:  Breathing in (inhaling) through your nose for 1 second. Then, pucker your lips and breath out (exhale) through your lips for 2 seconds.  Putting one hand on your belly (abdomen). Breathe in slowly through your nose for 1 second. Your hand on your belly should move out. Pucker your lips and breathe out slowly through your lips. Your hand on your belly should move in as you breathe out.  Controlled coughing Learn and use  controlled coughing to clear mucus from your lungs. Follow these steps: 1. Lean your head a little forward. 2. Breathe in deeply. 3. Try to hold your breath for 3 seconds. 4. Keep your mouth slightly open while coughing 2 times. 5. Spit any mucus out into a tissue. 6. Rest and do the steps again 1 or 2 times as needed. General instructions  Make sure you get all the shots (vaccines) that your doctor recommends. Ask your doctor about a flu shot and a pneumonia shot.  Use oxygen therapy and pulmonary rehabilitation if told by your doctor. If you need home oxygen therapy, ask your doctor if you should buy a tool to measure your oxygen level (oximeter).  Make a COPD action plan with your doctor. This helps you to know what to do if you feel worse than usual.  Manage any other conditions you have as told by your doctor.  Avoid going outside when it is very hot, cold, or humid.  Avoid people who have a sickness you can catch (contagious).  Keep all follow-up visits as told by your doctor. This is important. Contact a doctor if:  You cough up more mucus than usual.  There is a change in the color or thickness of the mucus.  It is harder to breathe than usual.  Your breathing is faster than usual.  You have trouble sleeping.  You need to use your medicines more often than usual.  You have trouble doing your normal activities such as getting dressed or walking around the house. Get help right away if:  You have shortness of breath while resting.  You have shortness of breath that stops you from: ? Being able to talk. ? Doing normal activities.  Your chest hurts for longer than 5 minutes.  Your skin color is more blue than usual.  Your pulse oximeter shows that you have low oxygen for longer than 5 minutes.  You have a fever.  You feel too tired to breathe normally. Summary  Chronic obstructive pulmonary disease (COPD) is a long-term lung problem.  The way your lungs  work will never return to normal. Usually the condition gets worse over time. There are things you can do to keep yourself as healthy as possible.  Take over-the-counter and prescription medicines only as told by your doctor.  If you smoke, stop. Smoking makes the problem worse. This information is not intended to replace advice given to you by your health care provider. Make sure you discuss any questions you have with your health care provider. Document Released: 03/25/2008 Document Revised: 09/19/2017 Document Reviewed: 11/11/2016 Elsevier Patient Education  2020 [employer].

## 2019-05-03 NOTE — Progress Notes (Signed)
f °

## 2019-05-03 NOTE — Progress Notes (Signed)
Discussed results with patient in office.  Nothing further is needed at this time.  Brian Mack FNP  

## 2019-05-03 NOTE — Assessment & Plan Note (Signed)
Plan: Continue oxygen therapy as prescribed Maintain oxygen levels greater than 90% Wear your oxygen with exertion

## 2019-05-03 NOTE — Assessment & Plan Note (Signed)
Assessment: Current everyday smoker Currently smoking 1 to 4 cigarettes a day Patient is interested in stopping smoking but feels that she cannot at this time Has not set quit date 23-pack-year smoking history  Plan: We recommend that you stop smoking

## 2019-05-03 NOTE — Progress Notes (Signed)
Full PFT performed today. °

## 2019-05-03 NOTE — Assessment & Plan Note (Signed)
Assessment: Maintained on Trelegy Ellipta Current every day smoker, trying to quit smoking 1 to 4 cigarettes a day, July/2020 pulmonary function test showing COPD Gold 4, FEV1 0.47 (25% predicted), DLCO 54 mMRC 3 today Clear breath sounds on exam today, diminished throughout entire exam  Plan: Continue Trelegy Ellipta Signed handicap placard today Continue rescue inhaler as needed Continue to try to be as active as possible Follow-up with our office in 3 months

## 2019-05-18 ENCOUNTER — Other Ambulatory Visit: Payer: Self-pay

## 2019-05-18 ENCOUNTER — Encounter: Payer: Self-pay | Admitting: Internal Medicine

## 2019-05-18 ENCOUNTER — Ambulatory Visit (INDEPENDENT_AMBULATORY_CARE_PROVIDER_SITE_OTHER): Payer: No Typology Code available for payment source | Admitting: Internal Medicine

## 2019-05-18 VITALS — BP 118/70 | HR 67 | Temp 98.0°F | Wt 183.0 lb

## 2019-05-18 DIAGNOSIS — H6981 Other specified disorders of Eustachian tube, right ear: Secondary | ICD-10-CM | POA: Diagnosis not present

## 2019-05-18 DIAGNOSIS — H6980 Other specified disorders of Eustachian tube, unspecified ear: Secondary | ICD-10-CM | POA: Insufficient documentation

## 2019-05-18 DIAGNOSIS — G4700 Insomnia, unspecified: Secondary | ICD-10-CM

## 2019-05-18 DIAGNOSIS — J309 Allergic rhinitis, unspecified: Secondary | ICD-10-CM

## 2019-05-18 DIAGNOSIS — J9611 Chronic respiratory failure with hypoxia: Secondary | ICD-10-CM

## 2019-05-18 DIAGNOSIS — H6990 Unspecified Eustachian tube disorder, unspecified ear: Secondary | ICD-10-CM | POA: Insufficient documentation

## 2019-05-18 MED ORDER — METHYLPREDNISOLONE ACETATE 80 MG/ML IJ SUSP
80.0000 mg | Freq: Once | INTRAMUSCULAR | Status: AC
  Administered 2019-05-18: 80 mg via INTRAMUSCULAR

## 2019-05-18 MED ORDER — FEXOFENADINE HCL 180 MG PO TABS: 180.0000 mg | ORAL_TABLET | Freq: Every day | ORAL | 2 refills | Status: DC

## 2019-05-18 MED ORDER — PREDNISONE 10 MG PO TABS: ORAL_TABLET | ORAL | 0 refills | Status: DC

## 2019-05-18 MED ORDER — TRIAMCINOLONE ACETONIDE 55 MCG/ACT NA AERO: 2.0000 | INHALATION_SPRAY | Freq: Every day | NASAL | 12 refills | Status: DC

## 2019-05-18 MED ORDER — TRAZODONE HCL 50 MG PO TABS: 25.0000 mg | ORAL_TABLET | Freq: Every evening | ORAL | 3 refills | Status: DC | PRN

## 2019-05-18 NOTE — Assessment & Plan Note (Signed)
stable overall by history and exam, recent data reviewed with pt, and pt to continue medical treatment as before,  to f/u any worsening symptoms or concerns  

## 2019-05-18 NOTE — Progress Notes (Signed)
Subjective:    Patient ID: [name], female    DOB: 1956/07/15, 63 y.o.   MRN: [id number]  HPI  .Here to f/u on home o2; Does have several wks ongoing nasal allergy symptoms with clearish congestion, itch and sneezing, without fever, pain, ST, cough, swelling or wheezing.  Also has right ear pulse she can hear, makes it very difficult to get to sleep.  Takes mucinex daily but not really taking the flonase.  Pt denies chest pain, increased sob or doe, wheezing, orthopnea, PND, increased LE swelling, palpitations, dizziness or syncope.   Pt denies polydipsia, polyuria Past Medical History:  Diagnosis Date  . Bronchitis   . CAD (coronary artery disease)    a. 02/2002: CABG x3 with LIMA to LAD, RIMA to RCA, and SVG to OM  . CHF (congestive heart failure) ([place])   . COPD (chronic obstructive pulmonary disease) ([place])   . Headache   . Heart disease   . Hyperlipidemia   . Hypertension   . Impaired glucose tolerance 08/20/2014  . Sinusitis    Past Surgical History:  Procedure Laterality Date  . CORONARY ARTERY BYPASS GRAFT  2003   Triple bypass  . ESOPHAGOGASTRODUODENOSCOPY Left 06/13/2014   Procedure: ESOPHAGOGASTRODUODENOSCOPY (EGD);  Surgeon: Juanita Craver, MD;  Location: [facility] ENDOSCOPY;  Service: Endoscopy;  Laterality: Left;  . RIGID ESOPHAGOSCOPY N/A 06/12/2014   Procedure: RIGID ESOPHAGOSCOPY WITH FOREIGN BODY REMOVAL;  Surgeon: Ascencion Dike, MD;  Location: [facility];  Service: ENT;  Laterality: N/A;    reports that she has been smoking cigarettes. She started smoking about 47 years ago. She has a 11.75 Fleming-year smoking history. She has never used smokeless tobacco. She reports current alcohol use of about 5.0 - 6.0 standard drinks of alcohol per week. She reports current drug use. Drug: Marijuana. family history includes Brain cancer in her mother; Cancer in her maternal grandmother and another family member; Heart disease in her mother; Lung cancer in her maternal aunt and paternal uncle;  Stroke in an other family member; Suicidality in her brother. Allergies  Allergen Reactions  . Augmentin [Amoxicillin-Pot Clavulanate] Nausea And Vomiting   Current Outpatient Medications on File Prior to Visit  Medication Sig Dispense Refill  . acetaminophen (TYLENOL) 500 MG tablet Take 1,000 mg by mouth at bedtime as needed for headache.    . albuterol (PROVENTIL HFA) 108 (90 Base) MCG/ACT inhaler INHALE 1-2 PUFFS INTO THE LUNGS EVERY 6 (SIX) HOURS AS NEEDED FOR WHEEZING OR SHORTNESS OF BREATH. 6.7 Inhaler 5  . amLODipine (NORVASC) 5 MG tablet TAKE 1 TABLET BY MOUTH EVERY DAY 30 tablet 7  . carvedilol (COREG) 12.5 MG tablet TAKE 1 TABLET (12.5 MG TOTAL) BY MOUTH 2 (TWO) TIMES DAILY. 60 tablet 9  . cholecalciferol (VITAMIN D) 1000 units tablet Take 1,000 Units by mouth daily.    Marland Kitchen diltiazem (CARDIZEM) 30 MG tablet Take 1 tablet (30 mg total) by mouth every 6 (six) hours as needed. 30 tablet 6  . Fluticasone-Umeclidin-Vilant (TRELEGY ELLIPTA) 100-62.5-25 MCG/INH AEPB Inhale 1 puff into the lungs daily. 1 each 0  . Fluticasone-Umeclidin-Vilant (TRELEGY ELLIPTA) 100-62.5-25 MCG/INH AEPB Inhale 1 puff into the lungs daily. 1 each 0  . furosemide (LASIX) 40 MG tablet Take 20 mg by mouth daily.    Marland Kitchen ipratropium-albuterol (DUONEB) 0.5-2.5 (3) MG/3ML SOLN TAKE 3 MLS BY NEBULIZATION EVERY 6 (SIX) HOURS AS NEEDED. 360 mL 5  . isosorbide mononitrate (IMDUR) 60 MG 24 hr tablet TAKE 1 & 1/2 TABLET  BY MOUTH DAILY 45 tablet 7  . potassium chloride (K-DUR) 10 MEQ tablet Take 5 mEq by mouth daily.    . vitamin B-12 (CYANOCOBALAMIN) 1000 MCG tablet Take 1,000 mcg by mouth daily.    . vitamin C (ASCORBIC ACID) 500 MG tablet Take 1,000 mg by mouth daily.     No current facility-administered medications on file prior to visit.    Review of Systems  Constitutional: Negative for other unusual diaphoresis or sweats HENT: Negative for ear discharge or swelling Eyes: Negative for other worsening visual  disturbances Respiratory: Negative for stridor or other swelling  Gastrointestinal: Negative for worsening distension or other blood Genitourinary: Negative for retention or other urinary change Musculoskeletal: Negative for other MSK pain or swelling Skin: Negative for color change or other new lesions Neurological: Negative for worsening tremors and other numbness  Psychiatric/Behavioral: Negative for worsening agitation or other fatigue All other system neg per pt    Objective:   Physical Exam BP 118/70   Pulse 67   Temp 98 F (36.7 C)   Wt 183 lb (83 kg)   SpO2 94%   BMI 33.47 kg/m  VS noted, non toxic Constitutional: Pt appears in NAD HENT: Head: NCAT.  Right Ear: External ear normal.  Left Ear: External ear normal.  Bilat tm's with mild erythema.  Max sinus areas non tender.  Pharynx with mild erythema, no exudate Eyes: . Pupils are equal, round, and reactive to light. Conjunctivae and EOM are normal Nose: without d/c or deformity Neck: Neck supple. Gross normal ROM Cardiovascular: Normal rate and regular rhythm.   Pulmonary/Chest: Effort normal and breath sounds decreased without rales or wheezing.  Neurological: Pt is alert. At baseline orientation, motor grossly intact Skin: Skin is warm. No rashes, other new lesions, no LE edema Psychiatric: Pt behavior is normal without agitation  No other exam findings Lab Results  Component Value Date   WBC 8.6 03/24/2019   HGB 16.0 (H) 03/24/2019   HCT 48.3 (H) 03/24/2019   PLT 167.0 03/24/2019   GLUCOSE 117 (H) 03/24/2019   CHOL 237 (H) 09/03/2017   TRIG 302.0 (H) 09/03/2017   HDL 29.00 (L) 09/03/2017   LDLDIRECT 157.0 09/03/2017   LDLCALC 68 02/02/2014   ALT 61 (H) 03/24/2019   AST 44 (H) 03/24/2019   NA 141 03/24/2019   K 4.0 03/24/2019   CL 95 (L) 03/24/2019   CREATININE 0.66 03/24/2019   BUN 12 03/24/2019   CO2 40 (H) 03/24/2019   TSH 3.57 09/03/2017   HGBA1C 6.7 (H) 09/03/2017       Assessment & Plan:

## 2019-05-18 NOTE — Patient Instructions (Signed)
You had the steroid shot today  Please take all new medication as prescribed - the trazodone for sleep, and allegra and nasacort for allergies, as well as the prednisone for allergies  You can also take Mucinex (or it's generic off brand) for congestion, and tylenol as needed for pain.  Please continue all other medications as before, and refills have been done if requested.  Please have the pharmacy call with any other refills you may need.  Please keep your appointments with your specialists as you may have planned

## 2019-05-18 NOTE — Assessment & Plan Note (Addendum)
Mild to mod seasonal flare, for depomedrol IM 80,, for allegra and nasacort asd, to f/u any worsening symptoms or concerns

## 2019-05-18 NOTE — Assessment & Plan Note (Signed)
For trazodone qhs prn,  to f/u any worsening symptoms or concerns 

## 2019-05-18 NOTE — Assessment & Plan Note (Signed)
To cont mucinex, avoid decongestant due to cv comorbids, tx for allergies as above

## 2019-05-19 ENCOUNTER — Ambulatory Visit: Payer: Self-pay

## 2019-05-19 NOTE — Telephone Encounter (Signed)
Noted  

## 2019-05-19 NOTE — Telephone Encounter (Signed)
Outgoing call to Patient to assess if Covid testing is needed.  Patient states that She saw Dr.  Was diagnosed with a ear ache.  Does not feel she needs a covid testing.      Reason for Disposition . COVID-19 Testing, questions about  Answer Assessment - Initial Assessment Questions 1. COVID-19 DIAGNOSIS: "Who made your Coronavirus (COVID-19) diagnosis?" "Was it confirmed by a positive lab test?" If not diagnosed by a HCP, ask "Are there lots of cases (community spread) where you live?" (See public health department website, if unsure)      2. ONSET: "When did the COVID-19 symptoms start?"      Not sure 3. WORST SYMPTOM: "What is your worst symptom?" (e.g., cough, fever, shortness of breath, muscle aches)    Ear ache 4. COUGH: "Do you have a cough?" If so, ask: "How bad is the cough?"       denies 5. FEVER: "Do you have a fever?" If so, ask: "What is your temperature, how was it measured, and when did it start?"     denies 6. RESPIRATORY STATUS: "Describe your breathing?" (e.g., shortness of breath, wheezing, unable to speak)      denies 7. BETTER-SAME-WORSE: "Are you getting better, staying the same or getting worse compared to yesterday?"  If getting worse, ask, "In what way?"     same 8. HIGH RISK DISEASE: "Do you have any chronic medical problems?" (e.g., asthma, heart or lung disease, weak immune system, etc.)     denies 9. PREGNANCY: "Is there any chance you are pregnant?" "When was your last menstrual period?"     denies 10. OTHER SYMPTOMS: "Do you have any other symptoms?"  (e.g., chills, fatigue, headache, loss of smell or taste, muscle pain, sore throat)       Ear ache  Protocols used: CORONAVIRUS (COVID-19) DIAGNOSED OR SUSPECTED-A-AH

## 2019-05-23 ENCOUNTER — Encounter: Payer: Self-pay | Admitting: Internal Medicine

## 2019-05-24 ENCOUNTER — Telehealth: Payer: Self-pay | Admitting: Pulmonary Disease

## 2019-05-24 NOTE — Telephone Encounter (Signed)
Spoke with patient. She stated that she is thinking about filing for disability and she wanted to know the process. I explained to her we would be happy to fill out the forms but there was a process. Explained process to her and provided her with the medical records number as well as the [employer] fax number. She verbalized understanding.   Nothing further needed at time of call.

## 2019-06-04 ENCOUNTER — Telehealth: Payer: Self-pay | Admitting: Pulmonary Disease

## 2019-06-04 ENCOUNTER — Telehealth: Payer: Self-pay

## 2019-06-04 NOTE — Telephone Encounter (Signed)
Called and spoke with pt. Asked if she had contacted [employer] in regards to having the disability papers sent to our office and she said that she did contact them today so they should be sending forms to our office soon to have filled out. Stated to pt as soon as we received the forms, we would give them to provider to fill out and once they have been completed, we would then send them back to [employer]. Pt verbalized understanding. Nothing further needed.

## 2019-06-04 NOTE — Telephone Encounter (Signed)
Needs follow up appointment. No answer when called. 

## 2019-06-08 ENCOUNTER — Other Ambulatory Visit: Payer: Self-pay | Admitting: Internal Medicine

## 2019-06-09 ENCOUNTER — Other Ambulatory Visit: Payer: Self-pay | Admitting: Cardiology

## 2019-06-09 ENCOUNTER — Other Ambulatory Visit: Payer: Self-pay | Admitting: Internal Medicine

## 2019-06-10 ENCOUNTER — Telehealth: Payer: Self-pay | Admitting: Pulmonary Disease

## 2019-06-10 NOTE — Telephone Encounter (Signed)
Forms signed by B. Mack, NP and returned to P. Rattray.

## 2019-06-11 NOTE — Telephone Encounter (Signed)
Rec'd completed forms - fwd to [employer] via interoffice mail -pr  °

## 2019-06-26 ENCOUNTER — Emergency Department (HOSPITAL_COMMUNITY)
Admission: EM | Admit: 2019-06-26 | Discharge: 2019-06-26 | Disposition: A | Discharge status: Home / Self Care | Payer: No Typology Code available for payment source | Attending: Emergency Medicine | Admitting: Emergency Medicine

## 2019-06-26 ENCOUNTER — Encounter (HOSPITAL_COMMUNITY): Payer: Self-pay | Admitting: Emergency Medicine

## 2019-06-26 ENCOUNTER — Other Ambulatory Visit: Payer: Self-pay

## 2019-06-26 ENCOUNTER — Emergency Department (HOSPITAL_COMMUNITY): Payer: No Typology Code available for payment source

## 2019-06-26 DIAGNOSIS — J441 Chronic obstructive pulmonary disease with (acute) exacerbation: Secondary | ICD-10-CM

## 2019-06-26 DIAGNOSIS — F17210 Nicotine dependence, cigarettes, uncomplicated: Secondary | ICD-10-CM | POA: Diagnosis not present

## 2019-06-26 DIAGNOSIS — I2510 Atherosclerotic heart disease of native coronary artery without angina pectoris: Secondary | ICD-10-CM | POA: Insufficient documentation

## 2019-06-26 DIAGNOSIS — I509 Heart failure, unspecified: Secondary | ICD-10-CM | POA: Diagnosis not present

## 2019-06-26 DIAGNOSIS — Z20828 Contact with and (suspected) exposure to other viral communicable diseases: Secondary | ICD-10-CM | POA: Insufficient documentation

## 2019-06-26 DIAGNOSIS — Z79899 Other long term (current) drug therapy: Secondary | ICD-10-CM | POA: Diagnosis not present

## 2019-06-26 DIAGNOSIS — Z951 Presence of aortocoronary bypass graft: Secondary | ICD-10-CM | POA: Insufficient documentation

## 2019-06-26 DIAGNOSIS — R079 Chest pain, unspecified: Secondary | ICD-10-CM | POA: Diagnosis present

## 2019-06-26 DIAGNOSIS — I110 Hypertensive heart disease with heart failure: Secondary | ICD-10-CM | POA: Diagnosis not present

## 2019-06-26 LAB — BASIC METABOLIC PANEL
Anion gap: 11 (ref 5–15)
BUN: 11 mg/dL (ref 8–23)
CO2: 31 mmol/L (ref 22–32)
Calcium: 8.8 mg/dL — ABNORMAL LOW (ref 8.9–10.3)
Chloride: 94 mmol/L — ABNORMAL LOW (ref 98–111)
Creatinine, Ser: 0.47 mg/dL (ref 0.44–1.00)
GFR calc Af Amer: >60 mL/min (ref >60)
GFR calc non Af Amer: >60 mL/min (ref >60)
Glucose, Bld: 158 mg/dL — ABNORMAL HIGH (ref 70–99)
Potassium: 3.7 mmol/L (ref 3.5–5.1)
Sodium: 136 mmol/L (ref 135–145)

## 2019-06-26 LAB — BRAIN NATRIURETIC PEPTIDE: B Natriuretic Peptide: 30.5 pg/mL (ref 0.0–100.0)

## 2019-06-26 LAB — CBC
HCT: 45.6 % (ref 36.0–46.0)
Hemoglobin: 14.1 g/dL (ref 12.0–15.0)
MCH: 28.7 pg (ref 26.0–34.0)
MCHC: 30.9 g/dL (ref 30.0–36.0)
MCV: 92.7 fL (ref 80.0–100.0)
Platelets: 180 10*3/uL (ref 150–400)
RBC: 4.92 MIL/uL (ref 3.87–5.11)
RDW: 13.2 % (ref 11.5–15.5)
WBC: 9 10*3/uL (ref 4.0–10.5)
nRBC: 0 % (ref 0.0–0.2)

## 2019-06-26 LAB — SARS CORONAVIRUS 2 BY RT PCR (HOSPITAL ORDER, PERFORMED IN ~~LOC~~ HOSPITAL LAB): SARS Coronavirus 2: NEGATIVE

## 2019-06-26 LAB — TROPONIN I (HIGH SENSITIVITY)
Troponin I (High Sensitivity): 4 ng/L (ref <18)
Troponin I (High Sensitivity): 6 ng/L (ref <18)

## 2019-06-26 MED ORDER — PREDNISONE 20 MG PO TABS
60.0000 mg | ORAL_TABLET | Freq: Once | ORAL | Status: AC
  Administered 2019-06-26: 60 mg via ORAL
  Filled 2019-06-26: qty 3

## 2019-06-26 MED ORDER — SODIUM CHLORIDE 0.9% FLUSH
3.0000 mL | Freq: Once | INTRAVENOUS | Status: AC
  Administered 2019-06-26: 3 mL via INTRAVENOUS

## 2019-06-26 MED ORDER — PREDNISONE 20 MG PO TABS: 60.0000 mg | ORAL_TABLET | Freq: Every day | ORAL | 0 refills | Status: AC

## 2019-06-26 MED ORDER — ALBUTEROL SULFATE HFA 108 (90 BASE) MCG/ACT IN AERS
6.0000 | INHALATION_SPRAY | Freq: Once | RESPIRATORY_TRACT | Status: AC
  Administered 2019-06-26: 6 via RESPIRATORY_TRACT
  Filled 2019-06-26: qty 6.7

## 2019-06-26 NOTE — ED Provider Notes (Signed)
[facility] EMERGENCY DEPARTMENT Provider Note   CSN: [id number] Arrival date & time: 06/26/19  1853     History   Chief Complaint Chief Complaint  Patient presents with  . Chest Pain  . Shortness of Breath    HPI [name] is a 63 y.o. female.     The history is provided by the patient.  Chest Pain Pain location:  Substernal area Pain quality: pressure   Pain radiates to:  R arm Pain severity:  Mild Onset quality:  Gradual Duration:  2 days Timing:  Intermittent Progression:  Waxing and waning Chronicity:  New Context: at rest   Relieved by:  Nothing Worsened by:  Exertion Associated symptoms: lower extremity edema and shortness of breath   Associated symptoms: no abdominal pain, no anorexia, no anxiety, no back pain, no cough, no fever, no nausea, no numbness, no palpitations and no vomiting   Risk factors: coronary artery disease, high cholesterol and hypertension   Risk factors comment:  COPD on 3L of oxygen    Past Medical History:  Diagnosis Date  . Bronchitis   . CAD (coronary artery disease)    a. 02/2002: CABG x3 with LIMA to LAD, RIMA to RCA, and SVG to OM  . CHF (congestive heart failure) ([place])   . COPD (chronic obstructive pulmonary disease) ([place])   . Headache   . Heart disease   . Hyperlipidemia   . Hypertension   . Impaired glucose tolerance 08/20/2014  . Sinusitis     Patient Active Problem List   Diagnosis Date Noted  . Eustachian tube dysfunction 05/18/2019  . Insomnia 05/18/2019  . Chronic respiratory failure with hypoxia ([place]) 12/14/2018  . COPD exacerbation ([place]) 09/03/2017  . Tobacco abuse 03/07/2017  . Sinusitis, acute 04/08/2016  . Left lumbar radiculopathy 10/19/2015  . Cough 08/29/2015  . Pedal edema 02/22/2015  . Impaired glucose tolerance 08/20/2014  . Pruritus 07/08/2014  . Nocturnal hypoxemia 07/05/2014  . Esophagus, foreign body 06/12/2014  . Polycythemia, secondary 02/16/2014  . 5 mm Lung  nodule, solitary 02/02/2014  . GERD (gastroesophageal reflux disease) 02/01/2014  . Hypersomnolence 02/01/2014  . Allergic rhinitis 09/03/2013  . CAD (coronary artery disease) 09/03/2013  . Encounter for well adult exam with abnormal findings 09/03/2013  . Essential hypertension 07/07/2013  . COPD (chronic obstructive pulmonary disease) with chronic bronchitis ([place]) 07/07/2013  . Status post coronary artery bypass grafting 07/07/2013  . Headache 07/01/2013  . Hypertensive emergency 07/01/2013  . Polycythemia 07/01/2013    Past Surgical History:  Procedure Laterality Date  . CORONARY ARTERY BYPASS GRAFT  2003   Triple bypass  . ESOPHAGOGASTRODUODENOSCOPY Left 06/13/2014   Procedure: ESOPHAGOGASTRODUODENOSCOPY (EGD);  Surgeon: Juanita Craver, MD;  Location: [facility] ENDOSCOPY;  Service: Endoscopy;  Laterality: Left;  . RIGID ESOPHAGOSCOPY N/A 06/12/2014   Procedure: RIGID ESOPHAGOSCOPY WITH FOREIGN BODY REMOVAL;  Surgeon: Ascencion Dike, MD;  Location: [facility];  Service: ENT;  Laterality: N/A;     OB History   No obstetric history on file.      Home Medications    Prior to Admission medications   Medication Sig Start Date End Date Taking? Authorizing Provider  albuterol (PROVENTIL HFA) 108 (90 Base) MCG/ACT inhaler INHALE 1-2 PUFFS INTO THE LUNGS EVERY 6 (SIX) HOURS AS NEEDED FOR WHEEZING OR SHORTNESS OF BREATH. Patient taking differently: Inhale 1-2 puffs into the lungs every 6 (six) hours as needed for wheezing or shortness of breath.  06/15/18  Yes Lazaro Arms  S, NP  amLODipine (NORVASC) 5 MG tablet TAKE 1 TABLET BY MOUTH EVERY DAY Patient taking differently: Take 5 mg by mouth daily.  06/09/19  Yes Jerline Pain, MD  carvedilol (COREG) 12.5 MG tablet TAKE 1 TABLET (12.5 MG TOTAL) BY MOUTH 2 (TWO) TIMES DAILY. Patient taking differently: Take 12.5 mg by mouth 2 (two) times daily.  06/09/19  Yes Jerline Pain, MD  cholecalciferol (VITAMIN D) 1000 units tablet Take 1,000 Units by mouth daily.    Yes [provider]  fexofenadine (ALLEGRA) 180 MG tablet Take 1 tablet (180 mg total) by mouth daily. 05/18/19 05/17/20 Yes Biagio Borg, MD  Fluticasone-Umeclidin-Vilant (TRELEGY ELLIPTA) 100-62.5-25 MCG/INH AEPB Inhale 1 puff into the lungs daily. 05/03/19  Yes Lauraine Rinne, NP  furosemide (LASIX) 40 MG tablet Take 40 mg by mouth daily.    Yes [provider]  ibuprofen (ADVIL) 200 MG tablet Take 200 mg by mouth every 6 (six) hours as needed for moderate pain.   Yes [provider]  ipratropium-albuterol (DUONEB) 0.5-2.5 (3) MG/3ML SOLN TAKE 3 MLS BY NEBULIZATION EVERY 6 (SIX) HOURS AS NEEDED. 04/13/18  Yes Biagio Borg, MD  isosorbide mononitrate (IMDUR) 60 MG 24 hr tablet TAKE 1 & 1/2 TABLETS BY MOUTH DAILY Patient taking differently: Take 90 mg by mouth daily.  06/09/19  Yes Jerline Pain, MD  KLOR-CON M10 10 MEQ tablet TAKE 1 TABLET BY MOUTH EVERY DAY Patient taking differently: Take 10 mEq by mouth daily.  06/08/19  Yes Biagio Borg, MD  traZODone (DESYREL) 50 MG tablet TAKE 1/2 TO 1 TABLET AT BEDTIME AS NEEDED FOR SLEEP Patient taking differently: Take 25-50 mg by mouth at bedtime as needed for sleep.  06/09/19  Yes Biagio Borg, MD  triamcinolone (NASACORT) 55 MCG/ACT AERO nasal inhaler Place 2 sprays into the nose daily. 05/18/19  Yes Biagio Borg, MD  vitamin B-12 (CYANOCOBALAMIN) 1000 MCG tablet Take 1,000 mcg by mouth daily.   Yes [provider]  vitamin C (ASCORBIC ACID) 500 MG tablet Take 1,000 mg by mouth daily.   Yes [provider]  diltiazem (CARDIZEM) 30 MG tablet Take 1 tablet (30 mg total) by mouth every 6 (six) hours as needed. Patient not taking: Reported on 06/26/2019 03/02/19   Jerline Pain, MD  Fluticasone-Umeclidin-Vilant (TRELEGY ELLIPTA) 100-62.5-25 MCG/INH AEPB Inhale 1 puff into the lungs daily. Patient not taking: Reported on 06/26/2019 12/14/18   Lauraine Rinne, NP  predniSONE (DELTASONE) 20 MG tablet Take 3 tablets (60  mg total) by mouth daily for 5 days. 06/26/19 07/01/19  Lennice Sites, DO    Family History Family History  Problem Relation Age of Onset  . Lung cancer Paternal Uncle   . Heart disease Mother   . Brain cancer Mother   . Cancer Maternal Grandmother        colon  . Stroke Other   . Cancer Other        x 4 aunts  . Lung cancer Maternal Aunt   . Suicidality Brother     Social History Social History   Tobacco Use  . Smoking status: Current Every Day Smoker    Packs/day: 0.25    Years: 47.00    Pack years: 11.75    Types: Cigarettes    Start date: 12/29/1971  . Smokeless tobacco: Never Used  Substance Use Topics  . Alcohol use: Yes    Alcohol/week: 5.0 - 6.0 standard drinks  Types: 5 - 6 Glasses of wine per week  . Drug use: Yes    Types: Marijuana    Comment: as a teenager     Allergies   Augmentin [amoxicillin-pot clavulanate]   Review of Systems Review of Systems  Constitutional: Negative for chills and fever.  HENT: Negative for ear pain and sore throat.   Eyes: Negative for pain and visual disturbance.  Respiratory: Positive for shortness of breath. Negative for cough.   Cardiovascular: Positive for chest pain and leg swelling. Negative for palpitations.  Gastrointestinal: Negative for abdominal pain, anorexia, nausea and vomiting.  Genitourinary: Negative for dysuria and hematuria.  Musculoskeletal: Negative for arthralgias and back pain.  Skin: Negative for color change and rash.  Neurological: Negative for seizures, syncope and numbness.  All other systems reviewed and are negative.    Physical Exam Updated Vital Signs  ED Triage Vitals  Enc Vitals Group     BP 06/26/19 2011 (!) 134/42     Pulse Rate 06/26/19 2011 (!) 57     Resp 06/26/19 2011 18     Temp 06/26/19 2011 98.4 F (36.9 C)     Temp Source 06/26/19 2011 Oral     SpO2 06/26/19 2011 99 %     Weight --      Height --      Head Circumference --      Peak Flow --      Pain Score  06/26/19 1902 0     Pain Loc --      Pain Edu? --      Excl. in [place]? --     Physical Exam Vitals signs and nursing note reviewed.  Constitutional:      General: She is not in acute distress.    Appearance: She is well-developed. She is not ill-appearing.  HENT:     Head: Normocephalic and atraumatic.  Eyes:     Extraocular Movements: Extraocular movements intact.     Conjunctiva/sclera: Conjunctivae normal.     Pupils: Pupils are equal, round, and reactive to light.  Neck:     Musculoskeletal: Normal range of motion and neck supple.  Cardiovascular:     Rate and Rhythm: Normal rate and regular rhythm.     Pulses:          Radial pulses are 2+ on the right side and 2+ on the left side.       Dorsalis pedis pulses are 2+ on the right side and 2+ on the left side.     Heart sounds: Normal heart sounds. No murmur.  Pulmonary:     Effort: Pulmonary effort is normal. No respiratory distress.     Breath sounds: Decreased breath sounds and wheezing present. No rhonchi or rales.  Abdominal:     Palpations: Abdomen is soft.     Tenderness: There is no abdominal tenderness.  Musculoskeletal: Normal range of motion.     Right lower leg: Edema (trace) present.     Left lower leg: Edema (trace) present.  Skin:    General: Skin is warm and dry.     Capillary Refill: Capillary refill takes less than 2 seconds.  Neurological:     General: No focal deficit present.     Mental Status: She is alert.  Psychiatric:        Mood and Affect: Mood normal.      ED Treatments / Results  Labs (all labs ordered are listed, but only abnormal results are displayed) Labs  Reviewed  BASIC METABOLIC PANEL - Abnormal; Notable for the following components:      Result Value   Chloride 94 (*)    Glucose, Bld 158 (*)    Calcium 8.8 (*)    All other components within normal limits  SARS CORONAVIRUS 2 (HOSPITAL ORDER, PERFORMED IN [facility])  CBC  BRAIN NATRIURETIC PEPTIDE  TROPONIN I  (HIGH SENSITIVITY)  TROPONIN I (HIGH SENSITIVITY)    EKG EKG Interpretation  Date/Time:  Saturday June 26 2019 20:43:01 EDT Ventricular Rate:  56 PR Interval:    QRS Duration: 81 QT Interval:  456 QTC Calculation: 441 R Axis:   71 Text Interpretation:  Ectopic atrial rhythm Confirmed by Lennice Sites [phone]) on 06/26/2019 8:51:47 PM   Radiology Dg Chest 2 View  Result Date: 06/26/2019 CLINICAL DATA:  Chest pain and shortness of breath. EXAM: CHEST - 2 VIEW COMPARISON:  November 09, 2018 FINDINGS: Stable postsurgical changes from CABG. Cardiomediastinal silhouette is normal. Mediastinal contours appear intact. Calcific atherosclerotic disease of the aorta. There is no evidence of focal airspace consolidation, pleural effusion or pneumothorax. Chronic thickening and distortion of interstitial markings. Osseous structures are without acute abnormality. Soft tissues are grossly normal. IMPRESSION: 1. No active cardiopulmonary disease. 2. Chronic thickening and distortion of interstitial markings. Electronically Signed   By: Fidela Salisbury M.D.   On: 06/26/2019 19:44    Procedures Procedures (including critical care time)  Medications Ordered in ED Medications  sodium chloride flush (NS) 0.9 % injection 3 mL (3 mLs Intravenous Given 06/26/19 2133)  albuterol (VENTOLIN HFA) 108 (90 Base) MCG/ACT inhaler 6 puff (6 puffs Inhalation Given 06/26/19 2134)  predniSONE (DELTASONE) tablet 60 mg (60 mg Oral Given 06/26/19 2258)     Initial Impression / Assessment and Plan / ED Course  I have reviewed the triage vital signs and the nursing notes.  Pertinent labs & imaging results that were available during my care of the patient were reviewed by me and considered in my medical decision making (see chart for details).     ANETRIA CONDRA is a 63 year old female with history of hypertension, high cholesterol, heart failure, COPD on 3 L of oxygen, CAD status post CABG who presents to the ED  with chest pain, shortness of breath.  Patient with no active symptoms currently.  Has had shortness of breath with ambulation over the last 2 days.  Has had some chest tightness.  She has also not taken some of her diuretics over the last several days and has noticed some leg swelling.  Overall she appears comfortable on exam.  She has some trace edema in her legs.  She has diminished breath sounds with wheezing throughout.  EKG shows sinus rhythm.  No ischemic changes.  Unchanged from prior EKGs.  Troponin normal x2.  Patient does not have any chest pain throughout my care.  Have a low concern for ACS at this time.  Patient had improvement following albuterol treatment.  Will give steroids.  Chest x-ray showed no signs of pneumonia, no pneumothorax, no pleural effusion.  BNP was normal.  No major signs of volume overload on exam.  No significant anemia, electrolyte abnormality, kidney injury.  Coronavirus test is negative.  Overall likely patient with a COPD exacerbation.  Recommend close follow-up with her cardiologist, she prefers outpatient follow up which I think is reasonable.  She had a stress test last year that was unremarkable.  Has not had a heart catheterization since her CABG  which was about 17 years ago.  Given normal troponins and no chest pain and likely that this is more COPD related patient was discharged.  She is given strict return precautions if she developed any more severe and sustained chest pain.  She understands return precautions.  She is happy with discharge to home and follow-up with cardiology.  This chart was dictated using voice recognition software.  Despite best efforts to proofread,  errors can occur which can change the documentation meaning.    Final Clinical Impressions(s) / ED Diagnoses   Final diagnoses:  COPD exacerbation [facility])    ED Discharge Orders         Ordered    predniSONE (DELTASONE) 20 MG tablet  Daily     06/26/19 2301           Lennice Sites,  DO 06/26/19 2301

## 2019-06-26 NOTE — Discharge Instructions (Signed)
Use your albuterol every 4 hours for the next 24 hours for COPD exacerbation.  Take steroids as prescribed.  Follow-up with your cardiologist as we discussed.  Please return to the ED if you develop severe chest pain.

## 2019-06-26 NOTE — ED Triage Notes (Signed)
Pt here from home with c/o chest pain and sob , pt has missed a couple of doses ofh er lasix and has been having to sleep sitting up , pt is on O2 at [employer]

## 2019-06-26 NOTE — ED Notes (Signed)
E-signature not available, verbalized understanding of DC instructions and Rx. Pt has home O2 tank with her, sister en route to pick pt up

## 2019-06-28 ENCOUNTER — Other Ambulatory Visit: Payer: Self-pay | Admitting: Internal Medicine

## 2019-07-07 ENCOUNTER — Other Ambulatory Visit: Payer: Self-pay | Admitting: Internal Medicine

## 2019-07-12 ENCOUNTER — Other Ambulatory Visit: Payer: Self-pay | Admitting: Cardiology

## 2019-07-12 ENCOUNTER — Telehealth: Payer: Self-pay | Admitting: Internal Medicine

## 2019-07-12 DIAGNOSIS — Z20828 Contact with and (suspected) exposure to other viral communicable diseases: Secondary | ICD-10-CM

## 2019-07-12 DIAGNOSIS — Z20822 Contact with and (suspected) exposure to covid-19: Secondary | ICD-10-CM

## 2019-07-12 NOTE — Telephone Encounter (Signed)
Called patient and informed

## 2019-07-12 NOTE — Telephone Encounter (Signed)
I would not feel comfortable with these URI like symptoms that could be covid.  I can order a  covid test if she likes but o/w would need Urgent Care visit

## 2019-07-12 NOTE — Telephone Encounter (Signed)
[facility]urse Call: 07/11/2019 at 12:70pm  Patient called stating that she has chills, an earache, sore throat, and no appetite. The symptoms started a couple days ago.  She was advised to see her PCP within 24 hours.  I called the patient to follow up this morning (07/12/2019) to see how she was doing. She said that her symptoms have continued.  She is currently experiencing a sore throat, earache, no appetite, chills, shortness of breath (but patient states she has COPD so this is nothing new), no fever, and no loss of taste or smell. I asked if she would like to schedule a virtual visit with you. Patient said "No. I want to come in to see Dr Jenny Reichmann. I need to get a shot."  Would you be willing to see this patient in the office or what would you prefer?

## 2019-07-12 NOTE — Telephone Encounter (Signed)
Called patient and informed. She would like a COVID test ordered.

## 2019-07-12 NOTE — Telephone Encounter (Signed)
[facility] lab order is done  Let pt know the hours for testing ar 0800 to 330 pm unless they have changed the hours

## 2019-07-13 LAB — NOVEL CORONAVIRUS, NAA: SARS-CoV-2, NAA: NOT DETECTED

## 2019-07-14 ENCOUNTER — Encounter: Payer: Self-pay | Admitting: Internal Medicine

## 2019-07-14 ENCOUNTER — Ambulatory Visit (INDEPENDENT_AMBULATORY_CARE_PROVIDER_SITE_OTHER): Payer: No Typology Code available for payment source | Admitting: Internal Medicine

## 2019-07-14 DIAGNOSIS — J441 Chronic obstructive pulmonary disease with (acute) exacerbation: Secondary | ICD-10-CM | POA: Diagnosis not present

## 2019-07-14 DIAGNOSIS — J0140 Acute pansinusitis, unspecified: Secondary | ICD-10-CM | POA: Diagnosis not present

## 2019-07-14 DIAGNOSIS — J309 Allergic rhinitis, unspecified: Secondary | ICD-10-CM | POA: Diagnosis not present

## 2019-07-14 MED ORDER — TRIAMCINOLONE ACETONIDE 55 MCG/ACT NA AERO: 2.0000 | INHALATION_SPRAY | Freq: Every day | NASAL | 12 refills | Status: DC

## 2019-07-14 MED ORDER — PREDNISONE 10 MG PO TABS: ORAL_TABLET | ORAL | 0 refills | Status: DC

## 2019-07-14 MED ORDER — LEVOFLOXACIN 500 MG PO TABS: 500.0000 mg | ORAL_TABLET | Freq: Every day | ORAL | 0 refills | Status: AC

## 2019-07-14 MED ORDER — FEXOFENADINE HCL 180 MG PO TABS: 180.0000 mg | ORAL_TABLET | Freq: Every day | ORAL | 3 refills | Status: DC

## 2019-07-14 NOTE — Patient Instructions (Signed)
Please take all new medication as prescribed  Please continue all other medications as before, and refills have been done if requested.  Please have the pharmacy call with any other refills you may need.  Please continue your efforts at being more active, low cholesterol diet, and weight control.  Please keep your appointments with your specialists as you may have planned    

## 2019-07-14 NOTE — Assessment & Plan Note (Signed)
Mild to mod, for antibx course,  to f/u any worsening symptoms or concerns 

## 2019-07-14 NOTE — Progress Notes (Signed)
Patient ID: [name], female   DOB: 05/24/1956, 62 y.o.   MRN: [id number]  Phone visit  Cumulative time during 7-day interval 12 min, there was not an associated office visit for this concern within a 7 day period.  Verbal consent for services obtained from patient prior to services given.  Names of all persons present for services: Cathlean Cower, MD, patient  Chief complaint: sinus congestion and pain  History, background, results pertinent:   Here with 2-3 days acute onset fever, facial pain, left ear pressure, headache, general weakness and malaise, and greenish d/c, with mild ST and cough, but pt denies chest pain, wheezing, increased sob or doe, orthopnea, PND, increased LE swelling, palpitations, dizziness or syncope, except for recurrence of mild wheezing/sob since improved after tx in ED for copd exacerbation.  Also, Does have several wks ongoing nasal allergy symptoms with clearish congestion, itch and sneezing, without fever, pain, ST, cough, swelling or wheezing, and out of meds recently.   Past Medical History:  Diagnosis Date  . Bronchitis   . CAD (coronary artery disease)    a. 02/2002: CABG x3 with LIMA to LAD, RIMA to RCA, and SVG to OM  . CHF (congestive heart failure) ([place])   . COPD (chronic obstructive pulmonary disease) ([place])   . Headache   . Heart disease   . Hyperlipidemia   . Hypertension   . Impaired glucose tolerance 08/20/2014  . Sinusitis    No results found for this or any previous visit (from the past 48 hour(s)).  Lab Results  Component Value Date   WBC 9.0 06/26/2019   HGB 14.1 06/26/2019   HCT 45.6 06/26/2019   PLT 180 06/26/2019   GLUCOSE 158 (H) 06/26/2019   CHOL 237 (H) 09/03/2017   TRIG 302.0 (H) 09/03/2017   HDL 29.00 (L) 09/03/2017   LDLDIRECT 157.0 09/03/2017   LDLCALC 68 02/02/2014   ALT 61 (H) 03/24/2019   AST 44 (H) 03/24/2019   NA 136 06/26/2019   K 3.7 06/26/2019   CL 94 (L) 06/26/2019   CREATININE 0.47 06/26/2019   BUN 11  06/26/2019   CO2 31 06/26/2019   TSH 3.57 09/03/2017   HGBA1C 6.7 (H) 09/03/2017    A/P/next steps:   1)  Sinusitis, acute  - Mild to mod, for antibx course,  to f/u any worsening symptoms or concerns  2)  Copd exacerbation - for restart predpac asd, cont inhalers,  to f/u any worsening symptoms or concerns  3)  Allergic rhinitis - for restart allegra, nasacort and should improve with predpac asd]  Cathlean Cower MD

## 2019-07-15 ENCOUNTER — Other Ambulatory Visit: Payer: Self-pay | Admitting: Internal Medicine

## 2019-07-23 ENCOUNTER — Other Ambulatory Visit: Payer: Self-pay | Admitting: Internal Medicine

## 2019-07-26 ENCOUNTER — Other Ambulatory Visit: Payer: Self-pay | Admitting: Internal Medicine

## 2019-07-29 ENCOUNTER — Encounter: Payer: Self-pay | Admitting: Pulmonary Disease

## 2019-07-29 ENCOUNTER — Other Ambulatory Visit: Payer: Self-pay

## 2019-07-29 ENCOUNTER — Ambulatory Visit (INDEPENDENT_AMBULATORY_CARE_PROVIDER_SITE_OTHER): Payer: No Typology Code available for payment source | Admitting: Pulmonary Disease

## 2019-07-29 DIAGNOSIS — J309 Allergic rhinitis, unspecified: Secondary | ICD-10-CM | POA: Diagnosis not present

## 2019-07-29 DIAGNOSIS — J9611 Chronic respiratory failure with hypoxia: Secondary | ICD-10-CM

## 2019-07-29 DIAGNOSIS — J449 Chronic obstructive pulmonary disease, unspecified: Secondary | ICD-10-CM | POA: Diagnosis not present

## 2019-07-29 DIAGNOSIS — H6981 Other specified disorders of Eustachian tube, right ear: Secondary | ICD-10-CM | POA: Diagnosis not present

## 2019-07-29 DIAGNOSIS — Z87891 Personal history of nicotine dependence: Secondary | ICD-10-CM

## 2019-07-29 MED ORDER — PREDNISONE 5 MG PO TABS: 5.0000 mg | ORAL_TABLET | Freq: Every day | ORAL | 1 refills | Status: DC

## 2019-07-29 NOTE — Assessment & Plan Note (Signed)
Discussion: Patient has history of frequent exacerbations.  She did recently successfully stop smoking in August/2020.  Patient is interested in a trial of chronic daily steroids.  We discussed the positives and negatives of being maintained on chronic steroids.  Patient would like to proceed forward with a low-dose trial to see if this would help with improving her overall quality life as she has very severe COPD and frequently exacerbates.  Since last office visit 3 months ago patient has had an emergency room visit, multiple prednisone tapers in between there.  I believe it is reasonable to trial her on a low-dose of prednisone to see if this helps stabilize her breathing as well as improves her overall quality life.  We will see her back shortly over the next 6 to 8 weeks to see how she is doing on this trial.  Patient with elevated peripheral eosinophils on blood work in the past.  We will keep her on Trelegy Ellipta as well as hopes of the 5 mg of daily prednisone will help limit her exacerbations.  Plan: Start taking 5 mg of prednisone daily Continue Trelegy Ellipta Continue rescue inhaler as needed Continue oxygen therapy Congratulations on stopping smoking Follow-up with our office in 6 to 8 weeks with an appointment with Dr. Elsworth Soho

## 2019-07-29 NOTE — Assessment & Plan Note (Signed)
Congratulations on stopping smoking!  Plan: Continue to use nicotine replacement patches Continue to not smoke 

## 2019-07-29 NOTE — Assessment & Plan Note (Signed)
Plan: Continue Allegra Continue Nasacort Referral to ENT

## 2019-07-29 NOTE — Assessment & Plan Note (Signed)
Plan: Continue Nasacort Continue Allegra Referral to ENT

## 2019-07-29 NOTE — Progress Notes (Signed)
Virtual Visit via Telephone Note  I connected with [name] on 07/29/19 at 11:30 AM EDT by telephone and verified that I am speaking with the correct person using two identifiers.  Location: Patient: Home Provider: [facility] - [id number], [place], [place], [place] [address]   I discussed the limitations, risks, security and privacy concerns of performing an evaluation and management service by telephone and the availability of in person appointments. I also discussed with the patient that there may be a patient responsible charge related to this service. The patient expressed understanding and agreed to proceed.  Patient consented to consult via telephone: Yes People present and their role in pt care: Pt   History of Present Illness:  63 year old female former smoker followed in our office for chronic GOLD COPD IV  PMH: GERD Smoker/ Smoking History: Former Smoker. Quit in August/2020. 23-pack-year. Maintenance: Trelegy Ellipta  Pt of: Dr. Elsworth Soho  Chief complaint: 3 month follow up    63 year old female current every day smoker followed in our office for very severe COPD.  Patient had recent pulmonary function testing in July/2020 that showed a FEV1 of 0.47 (25% predicted).  Since last office visit patient has had an emergency room visit with a COPD exacerbation on 06/26/2019.  She was also treated with antibiotics for a suspected acute sinusitis or COPD exacerbation by primary care on 07/14/2019.  Patient reports that she has been feeling okay since seeing her primary care doctor in September/2020.  She reports she has had multiple other exacerbations that are not recorded in the chart at either urgent cares or or outside of the [facility] health network.  She feels that her breathing as well as her nasal and sinus symptoms improve on antibiotics as well as on steroids.  She is continuing to have eustachian tube dysfunction and ear discomfort.  She is never seen  ENT  Patient has successfully stop smoking.  She is currently using 40 mg nicotine patch to help her with this.  She stopped smoking the beginning of August/2020.  I have congratulated her with this.  She continues to use her Trelegy Ellipta inhaler, she is using a rescue inhaler 1 time daily.  She reports she does have a productive cough but she feels that this is her baseline color as well as sputum production.    Observations/Objective:  07/15/2019 - Sars COV2 - negative   03/07/2017-spirometry- severe airway obstruction FEV1 42  05/03/2019-pulmonary function test- FVC 1.05 (44% predicted), postbronchodilator ratio 47, postbronchodilator FEV1 0.47 (25% predicted), DLCO 54  01/11/18-chest x-ray-improved cardiomegaly and pulmonary vascular congestion, stable mild changes of COPD  12/09/2016-CT Angio- no radiographic evidence of pulmonary embolism    02/09/2017-echocardiogram LV ejection fraction 60 to [id number], grade 2 diastolic dysfunction  [id number] - CBC with Diff - eos relative 3.6, eos absolute 0.3  Assessment and Plan:  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  Plan: Continue to use nicotine replacement patches Continue to not smoke  Allergic rhinitis Plan: Continue Allegra Continue Nasacort Referral to ENT  Chronic respiratory failure with hypoxia ([place]) Plan: Continue oxygen therapy as prescribed  COPD (chronic obstructive pulmonary disease) with chronic bronchitis ([place]) Discussion: Patient has history of frequent exacerbations.  She did recently successfully stop smoking in August/2020.  Patient is interested in a trial of chronic daily steroids.  We discussed the positives and negatives of being maintained on chronic steroids.  Patient would like to proceed forward with a low-dose trial to see if  this would help with improving her overall quality life as she has very severe COPD and frequently exacerbates.  Since last office visit 3 months ago patient has had an  emergency room visit, multiple prednisone tapers in between there.  I believe it is reasonable to trial her on a low-dose of prednisone to see if this helps stabilize her breathing as well as improves her overall quality life.  We will see her back shortly over the next 6 to 8 weeks to see how she is doing on this trial.  Patient with elevated peripheral eosinophils on blood work in the past.  We will keep her on Trelegy Ellipta as well as hopes of the 5 mg of daily prednisone will help limit her exacerbations.  Plan: Start taking 5 mg of prednisone daily Continue Trelegy Ellipta Continue rescue inhaler as needed Continue oxygen therapy Congratulations on stopping smoking Follow-up with our office in 6 to 8 weeks with an appointment with Dr. Humberto Leep tube dysfunction Plan: Continue [place] Referral to ENT   Follow Up Instructions:  Return in about 6 weeks (around 09/09/2019), or if symptoms worsen or fail to improve, for Follow up with Dr. Elsworth Soho.   I discussed the assessment and treatment plan with the patient. The patient was provided an opportunity to ask questions and all were answered. The patient agreed with the plan and demonstrated an understanding of the instructions.   The patient was advised to call back or seek an in-person evaluation if the symptoms worsen or if the condition fails to improve as anticipated.  I provided 24 minutes of non-face-to-face time during this encounter.   Lauraine Rinne, NP

## 2019-07-29 NOTE — Patient Instructions (Signed)
You were seen today by Lauraine Rinne, NP  for:   1. COPD (chronic obstructive pulmonary disease) with chronic bronchitis (HCC)  - predniSONE (DELTASONE) 5 MG tablet; Take 1 tablet (5 mg total) by mouth daily with breakfast.  Dispense: 30 tablet; Refill: 1  Trelegy Ellipta  >>> 1 puff daily in the morning >>>rinse mouth out after use  >>> This inhaler contains 3 medications that help manage her respiratory status, contact our office if you cannot afford this medication or unable to remain on this medication  Only use your albuterol as a rescue medication to be used if you can't catch your breath by resting or doing a relaxed purse lip breathing pattern.  - The less you use it, the better it will work when you need it. - Ok to use up to 2 puffs  every 4 hours if you must but call for immediate appointment if use goes up over your usual need - Don't leave home without it !!  (think of it like the spare tire for your car)    2. Allergic rhinitis, unspecified seasonality, unspecified trigger  Continue daily Allegra  Continue Flonase  - Ambulatory referral to ENT  3. Dysfunction of right eustachian tube  - Ambulatory referral to ENT  4. Chronic respiratory failure with hypoxia (HCC)  Continue oxygen therapy as prescribed  >>>maintain oxygen saturations greater than 88 percent  >>>if unable to maintain oxygen saturations please contact the office  >>>do not smoke with oxygen  >>>can use nasal saline gel or nasal saline rinses to moisturize nose if oxygen causes dryness   5. 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  Keep up the hard work  Nicotine patches: >>>Make sure you rotate sites that you do not get skin irritation, Apply 1 patch each morning to a non-hairy skin site   If you are smoking less than 10 cigarettes a day or weight less than 45 kg start with medium dose pack of 14 mg a day for 6 weeks, followed by 7 mg a day for 2 weeks   >>>If insomnia occurs you are having trouble sleeping you can take the patch off at night, and place a new one on in the morning >>>If the patch is removed at night and you have morning cravings start short acting nicotine replacement therapy such as gum or lozenges    We recommend today:  Orders Placed This Encounter  Procedures  . Ambulatory referral to ENT    Referral Priority:   Routine    Referral Type:   Consultation    Referral Reason:   Specialty Services Required    Requested Specialty:   Otolaryngology    Number of Visits Requested:   1   Orders Placed This Encounter  Procedures  . Ambulatory referral to ENT   Meds ordered this encounter  Medications  . predniSONE (DELTASONE) 5 MG tablet    Sig: Take 1 tablet (5 mg total) by mouth daily with breakfast.    Dispense:  30 tablet    Refill:  1    Follow Up:    No follow-ups on file.   Please do your part to reduce the spread of COVID-19:      Reduce your risk of any infection  and COVID19 by using the similar precautions used for avoiding the common cold or flu:  Marland Kitchen Wash your hands often with soap and warm water for at least 20 seconds.  If soap and water are not readily available, use an alcohol-based hand sanitizer with at least 60% alcohol.  . If coughing or sneezing, cover your mouth and nose by coughing or sneezing into the elbow areas of your shirt or coat, into a tissue or into your sleeve (not your hands). Langley Gauss A MASK when in public  . Avoid shaking hands with others and consider head nods or verbal greetings only. . Avoid touching your eyes, nose, or mouth with unwashed hands.  . Avoid close contact with people who are sick. . Avoid places or events with large numbers of people in one location, like concerts or sporting events. . If you have some symptoms but not all symptoms, continue to monitor at home and seek medical attention if your symptoms worsen. . If you are having a medical emergency, call  911.   [place] / e-Visit: [email or web address]         [facility]: [facility]: [id number]                   [facility]: [id number]     It is flu season:   >>> Best ways to protect herself from the flu: Receive the yearly flu vaccine, practice good hand hygiene washing with soap and also using hand sanitizer when available, eat a nutritious meals, get adequate rest, hydrate appropriately   Please contact the office if your symptoms worsen or you have concerns that you are not improving.   Thank you for choosing [facility] for your healthcare, and for allowing [place] to partner with you on your healthcare journey. I am thankful to be able to provide care to you today.   Wyn Quaker FNP-C

## 2019-07-29 NOTE — Assessment & Plan Note (Signed)
Plan: Continue oxygen therapy as prescribed 

## 2019-08-22 ENCOUNTER — Other Ambulatory Visit: Payer: Self-pay | Admitting: Pulmonary Disease

## 2019-08-22 DIAGNOSIS — Z720 Tobacco use: Secondary | ICD-10-CM

## 2019-09-01 ENCOUNTER — Telehealth: Payer: Self-pay | Admitting: Cardiology

## 2019-09-01 NOTE — Telephone Encounter (Signed)
Pt c/o of Chest Pain: STAT if CP now or developed within 24 hours  1. Are you having CP right now?  Chest tightness, and soreness, not at this time  2. Are you experiencing any other symptoms (ex. SOB, nausea, vomiting, sweating)?   back hurting and chest sore, blood pressure been running high 3. How long have you been experiencing CP? About 2 days  4. Is your CP continuous or coming and going?  Come and goes  5. Have you taken Nitroglycerin?  She does not have nitroglycerin- she wants to be seen- I made her an appt for Friday with Dr Marlou Porch?

## 2019-09-01 NOTE — Telephone Encounter (Signed)
Per pt notes chest "throbbing" and back pain as well  Per pt has not been taking inhalers as directed Pt was recently seen in ED for COPD exarbation Encouraged if has inhalers to use as directed to see if this helps Otherwise if S/S worsen to go to ED for eval and tx Pt has appt for Friday 09/03/19  2:20 pm .[name] Housekeeper

## 2019-09-02 ENCOUNTER — Emergency Department (HOSPITAL_COMMUNITY): Payer: No Typology Code available for payment source

## 2019-09-02 ENCOUNTER — Emergency Department (HOSPITAL_COMMUNITY)
Admission: EM | Admit: 2019-09-02 | Discharge: 2019-09-03 | Disposition: A | Discharge status: Home / Self Care | Payer: No Typology Code available for payment source | Attending: Emergency Medicine | Admitting: Emergency Medicine

## 2019-09-02 ENCOUNTER — Other Ambulatory Visit: Payer: Self-pay

## 2019-09-02 ENCOUNTER — Encounter (HOSPITAL_COMMUNITY): Payer: Self-pay

## 2019-09-02 DIAGNOSIS — J449 Chronic obstructive pulmonary disease, unspecified: Secondary | ICD-10-CM | POA: Diagnosis not present

## 2019-09-02 DIAGNOSIS — R1013 Epigastric pain: Secondary | ICD-10-CM | POA: Insufficient documentation

## 2019-09-02 DIAGNOSIS — Z87891 Personal history of nicotine dependence: Secondary | ICD-10-CM | POA: Diagnosis not present

## 2019-09-02 DIAGNOSIS — I2510 Atherosclerotic heart disease of native coronary artery without angina pectoris: Secondary | ICD-10-CM | POA: Insufficient documentation

## 2019-09-02 DIAGNOSIS — Z79899 Other long term (current) drug therapy: Secondary | ICD-10-CM | POA: Diagnosis not present

## 2019-09-02 DIAGNOSIS — I110 Hypertensive heart disease with heart failure: Secondary | ICD-10-CM | POA: Insufficient documentation

## 2019-09-02 DIAGNOSIS — I509 Heart failure, unspecified: Secondary | ICD-10-CM | POA: Diagnosis not present

## 2019-09-02 DIAGNOSIS — K2970 Gastritis, unspecified, without bleeding: Secondary | ICD-10-CM

## 2019-09-02 DIAGNOSIS — Z951 Presence of aortocoronary bypass graft: Secondary | ICD-10-CM | POA: Insufficient documentation

## 2019-09-02 DIAGNOSIS — R0789 Other chest pain: Secondary | ICD-10-CM | POA: Diagnosis present

## 2019-09-02 LAB — CBC
HCT: 48.9 % — ABNORMAL HIGH (ref 36.0–46.0)
Hemoglobin: 15.8 g/dL — ABNORMAL HIGH (ref 12.0–15.0)
MCH: 29.2 pg (ref 26.0–34.0)
MCHC: 32.3 g/dL (ref 30.0–36.0)
MCV: 90.4 fL (ref 80.0–100.0)
Platelets: 225 10*3/uL (ref 150–400)
RBC: 5.41 MIL/uL — ABNORMAL HIGH (ref 3.87–5.11)
RDW: 12.8 % (ref 11.5–15.5)
WBC: 10.9 10*3/uL — ABNORMAL HIGH (ref 4.0–10.5)
nRBC: 0 % (ref 0.0–0.2)

## 2019-09-02 LAB — BASIC METABOLIC PANEL
Anion gap: 13 (ref 5–15)
BUN: 11 mg/dL (ref 8–23)
CO2: 30 mmol/L (ref 22–32)
Calcium: 10.2 mg/dL (ref 8.9–10.3)
Chloride: 93 mmol/L — ABNORMAL LOW (ref 98–111)
Creatinine, Ser: 0.36 mg/dL — ABNORMAL LOW (ref 0.44–1.00)
GFR calc Af Amer: >60 mL/min (ref >60)
GFR calc non Af Amer: >60 mL/min (ref >60)
Glucose, Bld: 170 mg/dL — ABNORMAL HIGH (ref 70–99)
Potassium: 4.2 mmol/L (ref 3.5–5.1)
Sodium: 136 mmol/L (ref 135–145)

## 2019-09-02 LAB — TROPONIN I (HIGH SENSITIVITY)
Troponin I (High Sensitivity): 3 ng/L (ref <18)
Troponin I (High Sensitivity): 4 ng/L (ref <18)

## 2019-09-02 MED ORDER — SODIUM CHLORIDE 0.9% FLUSH: 3.0000 mL | Freq: Once | INTRAVENOUS | Status: DC

## 2019-09-02 NOTE — ED Triage Notes (Signed)
Pt presents w/chest pain, back pain and stomach pain x3 days.

## 2019-09-03 ENCOUNTER — Ambulatory Visit: Payer: No Typology Code available for payment source | Admitting: Cardiology

## 2019-09-03 MED ORDER — ACETAMINOPHEN 500 MG PO TABS
1000.0000 mg | ORAL_TABLET | Freq: Once | ORAL | Status: AC
  Administered 2019-09-03: 1000 mg via ORAL
  Filled 2019-09-03: qty 2

## 2019-09-03 MED ORDER — ESOMEPRAZOLE MAGNESIUM 40 MG PO CPDR: 40.0000 mg | DELAYED_RELEASE_CAPSULE | Freq: Every day | ORAL | 0 refills | Status: DC

## 2019-09-03 MED ORDER — ASPIRIN 81 MG PO CHEW
324.0000 mg | CHEWABLE_TABLET | Freq: Once | ORAL | Status: AC
  Administered 2019-09-03: 324 mg via ORAL
  Filled 2019-09-03: qty 4

## 2019-09-03 MED ORDER — PANTOPRAZOLE SODIUM 40 MG PO TBEC
40.0000 mg | DELAYED_RELEASE_TABLET | Freq: Once | ORAL | Status: AC
  Administered 2019-09-03: 14:00:00 40 mg via ORAL
  Filled 2019-09-03: qty 1

## 2019-09-03 NOTE — Discharge Instructions (Signed)
Please return for any problem.  Follow-up with your regular care provider as instructed. °

## 2019-09-03 NOTE — ED Notes (Signed)
Patient verbalizes understanding of discharge instructions. Opportunity for questioning and answers were provided. Armband removed by staff, pt discharged from ED.  

## 2019-09-03 NOTE — ED Provider Notes (Signed)
[facility] EMERGENCY DEPARTMENT Provider Note   CSN: [id number] Arrival date & time: 09/02/19  1900     History   Chief Complaint Chief Complaint  Patient presents with  . Chest Pain    HPI [name] is a 63 y.o. female.     63 year old female with prior medical history as detailed below presents for multiple complaints.  Patient reports epigastric discomfort with associated "belching."  This has been ongoing for at least the last 4 to 5 days if not longer.  She denies associated chest pain.  She denies fever or cough.  She denies shortness of breath.  She is on 3 L nasal cannula and a chronic basis.  She reports that her symptoms are nearly continuous all the time.  She finds that symptoms are worse after eating a heavy meal.  The history is provided by the patient and medical records.  Abdominal Pain Pain location:  Epigastric Pain quality: bloating   Pain radiates to:  Does not radiate Pain severity:  Mild Onset quality:  Gradual Duration:  5 days Timing:  Constant Progression:  Waxing and waning Chronicity:  New Relieved by:  Nothing Worsened by:  Nothing Ineffective treatments:  None tried   Past Medical History:  Diagnosis Date  . Bronchitis   . CAD (coronary artery disease)    a. 02/2002: CABG x3 with LIMA to LAD, RIMA to RCA, and SVG to OM  . CHF (congestive heart failure) ([place])   . COPD (chronic obstructive pulmonary disease) ([place])   . Headache   . Heart disease   . Hyperlipidemia   . Hypertension   . Impaired glucose tolerance 08/20/2014  . Sinusitis     Patient Active Problem List   Diagnosis Date Noted  . Eustachian tube dysfunction 05/18/2019  . Insomnia 05/18/2019  . Chronic respiratory failure with hypoxia ([place]) 12/14/2018  . COPD exacerbation ([place]) 09/03/2017  . Former smoker 03/07/2017  . Sinusitis, acute 04/08/2016  . Left lumbar radiculopathy 10/19/2015  . Cough 08/29/2015  . Pedal edema 02/22/2015  .  Impaired glucose tolerance 08/20/2014  . Pruritus 07/08/2014  . Nocturnal hypoxemia 07/05/2014  . Esophagus, foreign body 06/12/2014  . Polycythemia, secondary 02/16/2014  . 5 mm Lung nodule, solitary 02/02/2014  . GERD (gastroesophageal reflux disease) 02/01/2014  . Hypersomnolence 02/01/2014  . Allergic rhinitis 09/03/2013  . CAD (coronary artery disease) 09/03/2013  . Encounter for well adult exam with abnormal findings 09/03/2013  . Essential hypertension 07/07/2013  . COPD (chronic obstructive pulmonary disease) with chronic bronchitis ([place]) 07/07/2013  . Status post coronary artery bypass grafting 07/07/2013  . Headache 07/01/2013  . Hypertensive emergency 07/01/2013  . Polycythemia 07/01/2013    Past Surgical History:  Procedure Laterality Date  . CORONARY ARTERY BYPASS GRAFT  2003   Triple bypass  . ESOPHAGOGASTRODUODENOSCOPY Left 06/13/2014   Procedure: ESOPHAGOGASTRODUODENOSCOPY (EGD);  Surgeon: Juanita Craver, MD;  Location: [facility] ENDOSCOPY;  Service: Endoscopy;  Laterality: Left;  . RIGID ESOPHAGOSCOPY N/A 06/12/2014   Procedure: RIGID ESOPHAGOSCOPY WITH FOREIGN BODY REMOVAL;  Surgeon: Ascencion Dike, MD;  Location: [facility];  Service: ENT;  Laterality: N/A;     OB History   No obstetric history on file.      Home Medications    Prior to Admission medications   Medication Sig Start Date End Date Taking? Authorizing Provider  albuterol (PROVENTIL HFA) 108 (90 Base) MCG/ACT inhaler INHALE 1-2 PUFFS INTO THE LUNGS EVERY 6 (SIX) HOURS AS NEEDED FOR  WHEEZING OR SHORTNESS OF BREATH. Patient taking differently: Inhale 1-2 puffs into the lungs every 6 (six) hours as needed for wheezing or shortness of breath.  06/15/18   Fenton Foy, NP  amLODipine (NORVASC) 5 MG tablet TAKE 1 TABLET BY MOUTH EVERY DAY Patient taking differently: Take 5 mg by mouth daily.  06/09/19   Jerline Pain, MD  carvedilol (COREG) 12.5 MG tablet TAKE 1 TABLET (12.5 MG TOTAL) BY MOUTH 2 (TWO) TIMES DAILY.  Patient taking differently: Take 12.5 mg by mouth 2 (two) times daily.  06/09/19   Jerline Pain, MD  cholecalciferol (VITAMIN D) 1000 units tablet Take 1,000 Units by mouth daily.    [provider]  diltiazem (CARDIZEM) 30 MG tablet Take 1 tablet (30 mg total) by mouth every 6 (six) hours as needed. Patient not taking: Reported on 06/26/2019 03/02/19   Jerline Pain, MD  esomeprazole (NEXIUM) 40 MG capsule Take 1 capsule (40 mg total) by mouth daily. 09/03/19   Valarie Merino, MD  fexofenadine (ALLEGRA) 180 MG tablet Take 1 tablet (180 mg total) by mouth daily. 07/14/19   Biagio Borg, MD  Fluticasone-Umeclidin-Vilant (TRELEGY ELLIPTA) 100-62.5-25 MCG/INH AEPB Inhale 1 puff into the lungs daily. Patient not taking: Reported on 06/26/2019 12/14/18   Lauraine Rinne, NP  furosemide (LASIX) 40 MG tablet TAKE 1 TABLET BY MOUTH TWICE A DAY 07/23/19   Biagio Borg, MD  ibuprofen (ADVIL) 200 MG tablet Take 200 mg by mouth every 6 (six) hours as needed for moderate pain.    [provider]  ipratropium-albuterol (DUONEB) 0.5-2.5 (3) MG/3ML SOLN TAKE 3 MLS BY NEBULIZATION EVERY 6 (SIX) HOURS AS NEEDED. 07/23/19   Biagio Borg, MD  isosorbide mononitrate (IMDUR) 60 MG 24 hr tablet TAKE 1 & 1/2 TABLETS BY MOUTH DAILY Patient taking differently: Take 90 mg by mouth daily.  06/09/19   Jerline Pain, MD  KLOR-CON M10 10 MEQ tablet TAKE 1 TABLET BY MOUTH EVERY DAY 07/15/19   Biagio Borg, MD  nicotine (NICODERM CQ - DOSED IN MG/24 HOURS) 21 mg/24hr patch PLACE 1 PATCH (21 MG TOTAL) ONTO THE SKIN DAILY. 08/23/19   Lauraine Rinne, NP  predniSONE (DELTASONE) 5 MG tablet Take 1 tablet (5 mg total) by mouth daily with breakfast. 07/29/19   Lauraine Rinne, NP  traZODone (DESYREL) 50 MG tablet TAKE 1/2 TO 1 TABLET AT BEDTIME AS NEEDED FOR SLEEP Patient taking differently: Take 25-50 mg by mouth at bedtime as needed for sleep.  06/09/19   Biagio Borg, MD  TRELEGY ELLIPTA 100-62.5-25 MCG/INH AEPB INHALE 1  PUFF BY MOUTH EVERY DAY 07/26/19   Biagio Borg, MD  triamcinolone (NASACORT) 55 MCG/ACT AERO nasal inhaler Place 2 sprays into the nose daily. 07/14/19   Biagio Borg, MD  vitamin B-12 (CYANOCOBALAMIN) 1000 MCG tablet Take 1,000 mcg by mouth daily.    [provider]  vitamin C (ASCORBIC ACID) 500 MG tablet Take 1,000 mg by mouth daily.    [provider]    Family History Family History  Problem Relation Age of Onset  . Lung cancer Paternal Uncle   . Heart disease Mother   . Brain cancer Mother   . Cancer Maternal Grandmother        colon  . Stroke Other   . Cancer Other        x 4 aunts  . Lung cancer Maternal Aunt   . Suicidality  Brother     Social History Social History   Tobacco Use  . Smoking status: Former Smoker    Packs/day: 0.50    Years: 47.00    Pack years: 23.50    Types: Cigarettes    Start date: 12/29/1971    Quit date: 05/22/2019    Years since quitting: 0.2  . Smokeless tobacco: Never Used  Substance Use Topics  . Alcohol use: Yes    Alcohol/week: 5.0 - 6.0 standard drinks    Types: 5 - 6 Glasses of wine per week  . Drug use: Yes    Types: Marijuana    Comment: as a teenager     Allergies   Augmentin [amoxicillin-pot clavulanate]   Review of Systems Review of Systems  Gastrointestinal: Positive for abdominal pain.  All other systems reviewed and are negative.    Physical Exam Updated Vital Signs BP (!) 167/75   Pulse 74   Temp 98.7 F (37.1 C) (Oral)   Resp (!) 28   SpO2 100%   Physical Exam Vitals signs and nursing note reviewed.  Constitutional:      General: She is not in acute distress.    Appearance: She is well-developed.  HENT:     Head: Normocephalic and atraumatic.  Eyes:     Conjunctiva/sclera: Conjunctivae normal.     Pupils: Pupils are equal, round, and reactive to light.  Neck:     Musculoskeletal: Normal range of motion and neck supple.  Cardiovascular:     Rate and Rhythm: Normal rate and  regular rhythm.     Heart sounds: Normal heart sounds.  Pulmonary:     Effort: Pulmonary effort is normal. No respiratory distress.     Breath sounds: Normal breath sounds.  Abdominal:     General: There is no distension.     Palpations: Abdomen is soft.     Tenderness: There is no abdominal tenderness.  Musculoskeletal: Normal range of motion.        General: No deformity.  Skin:    General: Skin is warm and dry.  Neurological:     Mental Status: She is alert and oriented to person, place, and time.      ED Treatments / Results  Labs (all labs ordered are listed, but only abnormal results are displayed) Labs Reviewed  BASIC METABOLIC PANEL - Abnormal; Notable for the following components:      Result Value   Chloride 93 (*)    Glucose, Bld 170 (*)    Creatinine, Ser 0.36 (*)    All other components within normal limits  CBC - Abnormal; Notable for the following components:   WBC 10.9 (*)    RBC 5.41 (*)    Hemoglobin 15.8 (*)    HCT 48.9 (*)    All other components within normal limits  TROPONIN I (HIGH SENSITIVITY)  TROPONIN I (HIGH SENSITIVITY)    EKG EKG Interpretation  Date/Time:  Thursday September 02 2019 19:11:55 EST Ventricular Rate:  77 PR Interval:  166 QRS Duration: 68 QT Interval:  380 QTC Calculation: 430 R Axis:   87 Text Interpretation: Normal sinus rhythm Normal ECG When compared with ECG of 06/26/2019, No significant change was found Confirmed by Delora Fuel ([id number]) on 09/02/2019 11:15:32 PM   Radiology Dg Chest 2 View  Result Date: 09/02/2019 CLINICAL DATA:  63 year old female with history of chest pain, back pain and stomach pain for the past 3 days. EXAM: CHEST - 2 VIEW COMPARISON:  Chest x-ray  06/26/2019. FINDINGS: Lung volumes are normal. No consolidative airspace disease. Diffuse interstitial prominence similar to recent prior examinations. Linear scarring in the right upper lobe, stable compared to prior studies. No pleural effusions. No  pneumothorax. No pulmonary nodule or mass noted. Pulmonary vasculature and the cardiomediastinal silhouette are within normal limits. Atherosclerosis in the thoracic aorta. Status post median sternotomy for CABG. IMPRESSION: 1. Chronic diffuse interstitial prominence concerning for interstitial lung disease, similar to prior studies. This could be better evaluated with follow-up nonemergent high-resolution chest CT. 2. Aortic atherosclerosis. Electronically Signed   By: Vinnie Langton M.D.   On: 09/02/2019 19:56    Procedures Procedures (including critical care time)  Medications Ordered in ED Medications  sodium chloride flush (NS) 0.9 % injection 3 mL (0 mLs Intravenous Hold 09/03/19 1234)  pantoprazole (PROTONIX) EC tablet 40 mg (has no administration in time range)  aspirin chewable tablet 324 mg (324 mg Oral Given 09/03/19 0754)  acetaminophen (TYLENOL) tablet 1,000 mg (1,000 mg Oral Given 09/03/19 0753)     Initial Impression / Assessment and Plan / ED Course  I have reviewed the triage vital signs and the nursing notes.  Pertinent labs & imaging results that were available during my care of the patient were reviewed by me and considered in my medical decision making (see chart for details).        MDM  Screen complete  DEJUANA SPEIR was evaluated in Emergency Department on 09/03/2019 for the symptoms described in the history of present illness. She was evaluated in the context of the global COVID-19 pandemic, which necessitated consideration that the patient might be at risk for infection with the SARS-CoV-2 virus that causes COVID-19. Institutional protocols and algorithms that pertain to the evaluation of patients at risk for COVID-19 are in a state of rapid change based on information released by regulatory bodies including the CDC and federal and state organizations. These policies and algorithms were followed during the patient's care in the ED.  Patient is presenting  for evaluation of epigastric discomfort.  Described symptoms are most consistent with likely gastritis.  Patient is not currently on antiacid medications.  Screening labs obtained in the ED without significant abnormality.  Patient's describe symptoms are not consistent with likely ACS or cardiac etiology.  Patient reports that in the past she had been on Nexium with resolution of her symptoms.  She requests prescription for same.  She appears to be appropriate for discharge.  Importance of close follow-up was stressed.  Strict return precautions given and understood.  Final Clinical Impressions(s) / ED Diagnoses   Final diagnoses:  Gastritis without bleeding, unspecified chronicity, unspecified gastritis type    ED Discharge Orders         Ordered    esomeprazole (NEXIUM) 40 MG capsule  Daily     09/03/19 1328           Valarie Merino, MD 09/03/19 1332

## 2019-09-06 ENCOUNTER — Other Ambulatory Visit: Payer: Self-pay

## 2019-09-06 ENCOUNTER — Other Ambulatory Visit (INDEPENDENT_AMBULATORY_CARE_PROVIDER_SITE_OTHER): Payer: No Typology Code available for payment source

## 2019-09-06 ENCOUNTER — Other Ambulatory Visit: Payer: Self-pay | Admitting: Internal Medicine

## 2019-09-06 ENCOUNTER — Encounter: Payer: Self-pay | Admitting: Internal Medicine

## 2019-09-06 ENCOUNTER — Ambulatory Visit (INDEPENDENT_AMBULATORY_CARE_PROVIDER_SITE_OTHER): Payer: No Typology Code available for payment source | Admitting: Internal Medicine

## 2019-09-06 VITALS — BP 140/82 | HR 73 | Temp 99.0°F | Wt 180.8 lb

## 2019-09-06 DIAGNOSIS — M545 Low back pain, unspecified: Secondary | ICD-10-CM

## 2019-09-06 DIAGNOSIS — R739 Hyperglycemia, unspecified: Secondary | ICD-10-CM

## 2019-09-06 DIAGNOSIS — E538 Deficiency of other specified B group vitamins: Secondary | ICD-10-CM

## 2019-09-06 DIAGNOSIS — E611 Iron deficiency: Secondary | ICD-10-CM

## 2019-09-06 DIAGNOSIS — E559 Vitamin D deficiency, unspecified: Secondary | ICD-10-CM

## 2019-09-06 DIAGNOSIS — M549 Dorsalgia, unspecified: Secondary | ICD-10-CM | POA: Insufficient documentation

## 2019-09-06 DIAGNOSIS — Z0001 Encounter for general adult medical examination with abnormal findings: Secondary | ICD-10-CM

## 2019-09-06 DIAGNOSIS — R1084 Generalized abdominal pain: Secondary | ICD-10-CM

## 2019-09-06 DIAGNOSIS — Z0000 Encounter for general adult medical examination without abnormal findings: Secondary | ICD-10-CM | POA: Diagnosis not present

## 2019-09-06 DIAGNOSIS — R1013 Epigastric pain: Secondary | ICD-10-CM

## 2019-09-06 DIAGNOSIS — J9611 Chronic respiratory failure with hypoxia: Secondary | ICD-10-CM

## 2019-09-06 DIAGNOSIS — J449 Chronic obstructive pulmonary disease, unspecified: Secondary | ICD-10-CM

## 2019-09-06 LAB — CBC WITH DIFFERENTIAL/PLATELET
Basophils Absolute: 0.1 10*3/uL (ref 0.0–0.1)
Basophils Relative: 0.9 % (ref 0.0–3.0)
Eosinophils Absolute: 0.2 10*3/uL (ref 0.0–0.7)
Eosinophils Relative: 1.7 % (ref 0.0–5.0)
HCT: 45.5 % (ref 36.0–46.0)
Hemoglobin: 15.4 g/dL — ABNORMAL HIGH (ref 12.0–15.0)
Lymphocytes Relative: 22.4 % (ref 12.0–46.0)
Lymphs Abs: 2.3 10*3/uL (ref 0.7–4.0)
MCHC: 33.7 g/dL (ref 30.0–36.0)
MCV: 88.1 fl (ref 78.0–100.0)
Monocytes Absolute: 0.8 10*3/uL (ref 0.1–1.0)
Monocytes Relative: 8.2 % (ref 3.0–12.0)
Neutro Abs: 6.8 10*3/uL (ref 1.4–7.7)
Neutrophils Relative %: 66.8 % (ref 43.0–77.0)
Platelets: 208 10*3/uL (ref 150.0–400.0)
RBC: 5.17 Mil/uL — ABNORMAL HIGH (ref 3.87–5.11)
RDW: 13.6 % (ref 11.5–15.5)
WBC: 10.1 10*3/uL (ref 4.0–10.5)

## 2019-09-06 LAB — HEMOGLOBIN A1C: Hgb A1c MFr Bld: 7.5 % — ABNORMAL HIGH (ref 4.6–6.5)

## 2019-09-06 LAB — LDL CHOLESTEROL, DIRECT: Direct LDL: 210 mg/dL

## 2019-09-06 LAB — URINALYSIS, ROUTINE W REFLEX MICROSCOPIC
Bilirubin Urine: NEGATIVE
Hgb urine dipstick: NEGATIVE
Ketones, ur: NEGATIVE
Leukocytes,Ua: NEGATIVE
Nitrite: NEGATIVE
Specific Gravity, Urine: 1.015 (ref 1.000–1.030)
Total Protein, Urine: NEGATIVE
Urine Glucose: NEGATIVE
Urobilinogen, UA: 0.2 (ref 0.0–1.0)
pH: 5.5 (ref 5.0–8.0)

## 2019-09-06 LAB — LIPID PANEL
Cholesterol: 298 mg/dL — ABNORMAL HIGH (ref 0–200)
HDL: 35.1 mg/dL — ABNORMAL LOW (ref >39.00)
NonHDL: 262.89
Total CHOL/HDL Ratio: 8
Triglycerides: 259 mg/dL — ABNORMAL HIGH (ref 0.0–149.0)
VLDL: 51.8 mg/dL — ABNORMAL HIGH (ref 0.0–40.0)

## 2019-09-06 LAB — IBC PANEL
Iron: 52 ug/dL (ref 42–145)
Saturation Ratios: 14.3 % — ABNORMAL LOW (ref 20.0–50.0)
Transferrin: 260 mg/dL (ref 212.0–360.0)

## 2019-09-06 LAB — VITAMIN B12: Vitamin B-12: 774 pg/mL (ref 211–911)

## 2019-09-06 LAB — LIPASE: Lipase: 172 U/L — ABNORMAL HIGH (ref 11.0–59.0)

## 2019-09-06 LAB — HEPATIC FUNCTION PANEL
ALT: 32 U/L (ref 0–35)
AST: 21 U/L (ref 0–37)
Albumin: 5 g/dL (ref 3.5–5.2)
Alkaline Phosphatase: 81 U/L (ref 39–117)
Bilirubin, Direct: 0.1 mg/dL (ref 0.0–0.3)
Total Bilirubin: 0.6 mg/dL (ref 0.2–1.2)
Total Protein: 8.4 g/dL — ABNORMAL HIGH (ref 6.0–8.3)

## 2019-09-06 LAB — VITAMIN D 25 HYDROXY (VIT D DEFICIENCY, FRACTURES): VITD: 15.07 ng/mL — ABNORMAL LOW (ref 30.00–100.00)

## 2019-09-06 LAB — BASIC METABOLIC PANEL
BUN: 14 mg/dL (ref 6–23)
CO2: 32 mEq/L (ref 19–32)
Calcium: 10 mg/dL (ref 8.4–10.5)
Chloride: 96 mEq/L (ref 96–112)
Creatinine, Ser: 0.63 mg/dL (ref 0.40–1.20)
GFR: 95.2 mL/min (ref >60.00)
Glucose, Bld: 162 mg/dL — ABNORMAL HIGH (ref 70–99)
Potassium: 3.8 mEq/L (ref 3.5–5.1)
Sodium: 139 mEq/L (ref 135–145)

## 2019-09-06 LAB — H. PYLORI ANTIBODY, IGG: H Pylori IgG: NEGATIVE

## 2019-09-06 LAB — TSH: TSH: 3.25 u[IU]/mL (ref 0.35–4.50)

## 2019-09-06 MED ORDER — LINACLOTIDE 145 MCG PO CAPS: 145.0000 ug | ORAL_CAPSULE | Freq: Every day | ORAL | 5 refills | Status: DC

## 2019-09-06 MED ORDER — VITAMIN D (ERGOCALCIFEROL) 1.25 MG (50000 UNIT) PO CAPS: 50000.0000 [IU] | ORAL_CAPSULE | ORAL | 0 refills | Status: DC

## 2019-09-06 NOTE — Assessment & Plan Note (Signed)
stable overall by history and exam, recent data reviewed with pt, and pt to continue medical treatment as before,  to f/u any worsening symptoms or concerns, for a1c with labs 

## 2019-09-06 NOTE — Assessment & Plan Note (Signed)
stable overall by history and exam, recent data reviewed with pt, and pt to continue medical treatment as before,  to f/u any worsening symptoms or concerns  

## 2019-09-06 NOTE — Assessment & Plan Note (Addendum)
And generalized pain, etiology unclear, possibly constipation but pain seems out of proportion and pt distended; for labs as ordered and CT abd/pelvis stat r/o pancreatitis , diverticulitis, or other  In addition to the time spent performing CPE, I spent an additional 40 minutes face to face,in which greater than 50% of this time was spent in counseling and coordination of care for patient's acute illness as documented, including the differential dx, treatment, further evaluation and other management of abd pain, back pain, hyperglycemia, COPD and chronic resp failure

## 2019-09-06 NOTE — Assessment & Plan Note (Signed)
Exam benign, diffuse lower back, etiology unclear, suspect referred pain abd related,  to f/u any worsening symptoms or concerns

## 2019-09-06 NOTE — Progress Notes (Signed)
Subjective:    Patient ID: [name], female    DOB: 1956-06-23, 63 y.o.   MRN: [id number]  HPI  Here for wellness and f/u;  Overall doing ok;  Pt denies Chest pain, worsening SOB, DOE, wheezing, orthopnea, PND, worsening LE edema, palpitations, dizziness or syncope.  Pt denies neurological change such as new headache, facial or extremity weakness.  Pt denies polydipsia, polyuria, or low sugar symptoms. Pt states overall good compliance with treatment and medications, good tolerability, and has been trying to follow appropriate diet.  Pt denies worsening depressive symptoms, suicidal ideation or panic. No fever, night sweats, wt loss, loss of appetite, or other constitutional symptoms.  Pt states good ability with ADL's, has low fall risk, home safety reviewed and adequate, no other significant changes in hearing or vision, and not active with exercise. Remains on chronic home o2 3L  Also, seen in ED aft 17 hr wait for 2 hr visit with dx gastritis with nausea and epigastric discomfort (and generalized) dull constant, radiates to the back, wants to belch but cant and constipation, tx with PPI but unfortunately not improved.  Not better with prune juice, enema, and tea cleanse.  Has not been able to have BM x 3 days.  Now starting to affect her breathing. No fever, vomiting.  Also has bilat mid and lower back pain unusual for her, sharp, intermittent, feels kind of like spasm but has not been letting up; and actually now seems maybe worse than the abd pain.  Does not want narcotic to make constipatoin worse  Also had some CP at ED visit but now resolved with the PPI from the ED.  BP has been elevated with the pain, assoc with recurrent HA not really better with the tylenol. Last colonoscopy 2015, with f/u for 2025.  Last EGD a few yrs ago after swallowed a bone, and she does not recall other mention of other problems - Dr Collene Mares.   Past Medical History:  Diagnosis Date  . Bronchitis   . CAD (coronary  artery disease)    a. 02/2002: CABG x3 with LIMA to LAD, RIMA to RCA, and SVG to OM  . CHF (congestive heart failure) ([place])   . COPD (chronic obstructive pulmonary disease) ([place])   . Headache   . Heart disease   . Hyperlipidemia   . Hypertension   . Impaired glucose tolerance 08/20/2014  . Sinusitis    Past Surgical History:  Procedure Laterality Date  . CORONARY ARTERY BYPASS GRAFT  2003   Triple bypass  . ESOPHAGOGASTRODUODENOSCOPY Left 06/13/2014   Procedure: ESOPHAGOGASTRODUODENOSCOPY (EGD);  Surgeon: Juanita Craver, MD;  Location: [facility] ENDOSCOPY;  Service: Endoscopy;  Laterality: Left;  . RIGID ESOPHAGOSCOPY N/A 06/12/2014   Procedure: RIGID ESOPHAGOSCOPY WITH FOREIGN BODY REMOVAL;  Surgeon: Ascencion Dike, MD;  Location: [facility] OR;  Service: ENT;  Laterality: N/A;    reports that she quit smoking about 3 months ago. Her smoking use included cigarettes. She started smoking about 47 years ago. She has a 23.50 pack-year smoking history. She has never used smokeless tobacco. She reports current alcohol use of about 5.0 - 6.0 standard drinks of alcohol per week. She reports current drug use. Drug: Marijuana. family history includes Brain cancer in her mother; Cancer in her maternal grandmother and another family member; Heart disease in her mother; Lung cancer in her maternal aunt and paternal uncle; Stroke in an other family member; Suicidality in her brother. Allergies  Allergen Reactions  .  Augmentin [Amoxicillin-Pot Clavulanate] Nausea And Vomiting   Current Outpatient Medications on File Prior to Visit  Medication Sig Dispense Refill  . albuterol (PROVENTIL HFA) 108 (90 Base) MCG/ACT inhaler INHALE 1-2 PUFFS INTO THE LUNGS EVERY 6 (SIX) HOURS AS NEEDED FOR WHEEZING OR SHORTNESS OF BREATH. (Patient taking differently: Inhale 1-2 puffs into the lungs every 6 (six) hours as needed for wheezing or shortness of breath. ) 6.7 Inhaler 5  . amLODipine (NORVASC) 5 MG tablet TAKE 1 TABLET BY MOUTH EVERY DAY  (Patient taking differently: Take 5 mg by mouth daily. ) 30 tablet 9  . carvedilol (COREG) 12.5 MG tablet TAKE 1 TABLET (12.5 MG TOTAL) BY MOUTH 2 (TWO) TIMES DAILY. (Patient taking differently: Take 12.5 mg by mouth 2 (two) times daily. ) 60 tablet 9  . cholecalciferol (VITAMIN D) 1000 units tablet Take 1,000 Units by mouth daily.    Marland Kitchen diltiazem (CARDIZEM) 30 MG tablet Take 1 tablet (30 mg total) by mouth every 6 (six) hours as needed. 30 tablet 6  . esomeprazole (NEXIUM) 40 MG capsule Take 1 capsule (40 mg total) by mouth daily. 30 capsule 0  . fexofenadine (ALLEGRA) 180 MG tablet Take 1 tablet (180 mg total) by mouth daily. 90 tablet 3  . Fluticasone-Umeclidin-Vilant (TRELEGY ELLIPTA) 100-62.5-25 MCG/INH AEPB Inhale 1 puff into the lungs daily. 1 each 0  . furosemide (LASIX) 40 MG tablet TAKE 1 TABLET BY MOUTH TWICE A DAY 180 tablet 0  . ibuprofen (ADVIL) 200 MG tablet Take 200 mg by mouth every 6 (six) hours as needed for moderate pain.    Marland Kitchen ipratropium-albuterol (DUONEB) 0.5-2.5 (3) MG/3ML SOLN TAKE 3 MLS BY NEBULIZATION EVERY 6 (SIX) HOURS AS NEEDED. 360 mL 5  . isosorbide mononitrate (IMDUR) 60 MG 24 hr tablet TAKE 1 & 1/2 TABLETS BY MOUTH DAILY (Patient taking differently: Take 90 mg by mouth daily. ) 45 tablet 9  . KLOR-CON M10 10 MEQ tablet TAKE 1 TABLET BY MOUTH EVERY DAY 90 tablet 0  . nicotine (NICODERM CQ - DOSED IN MG/24 HOURS) 21 mg/24hr patch PLACE 1 PATCH (21 MG TOTAL) ONTO THE SKIN DAILY. 28 patch 1  . predniSONE (DELTASONE) 5 MG tablet Take 1 tablet (5 mg total) by mouth daily with breakfast. 30 tablet 1  . traZODone (DESYREL) 50 MG tablet TAKE 1/2 TO 1 TABLET BY MOUTH AT BEDTIME AS NEEDED FOR SLEEP 90 tablet 0  . TRELEGY ELLIPTA 100-62.5-25 MCG/INH AEPB INHALE 1 PUFF BY MOUTH EVERY DAY 60 each 5  . triamcinolone (NASACORT) 55 MCG/ACT AERO nasal inhaler Place 2 sprays into the nose daily. 1 Inhaler 12  . vitamin B-12 (CYANOCOBALAMIN) 1000 MCG tablet Take 1,000 mcg by mouth daily.     . vitamin C (ASCORBIC ACID) 500 MG tablet Take 1,000 mg by mouth daily.     No current facility-administered medications on file prior to visit.    Review of Systems Constitutional: Negative for other unusual diaphoresis, sweats, appetite or weight changes HENT: Negative for other worsening hearing loss, ear pain, facial swelling, mouth sores or neck stiffness.   Eyes: Negative for other worsening pain, redness or other visual disturbance.  Respiratory: Negative for other stridor or swelling Cardiovascular: Negative for other palpitations or other chest pain  Gastrointestinal: Negative for worsening diarrhea or loose stools, blood in stool, distention or other pain Genitourinary: Negative for hematuria, flank pain or other change in urine volume.  Musculoskeletal: Negative for myalgias or other joint swelling.  Skin: Negative for other  color change, or other wound or worsening drainage.  Neurological: Negative for other syncope or numbness. Hematological: Negative for other adenopathy or swelling Psychiatric/Behavioral: Negative for hallucinations, other worsening agitation, SI, self-injury, or new decreased concentration All otherwise neg per pt     Objective:   Physical Exam BP 140/82   Pulse 73   Temp 99 F (37.2 C)   Wt 180 lb 12.8 oz (82 kg)   SpO2 98% Comment: ml  BMI 33.07 kg/m  on 3L [place] continous at rest VS noted, very uncomfortable appearing Constitutional: Pt is oriented to person, place, and time. Appears well-developed and well-nourished, in no significant distress and comfortable Head: Normocephalic and atraumatic  Eyes: Conjunctivae and EOM are normal. Pupils are equal, round, and reactive to light Right Ear: External ear normal without discharge Left Ear: External ear normal without discharge Nose: Nose without discharge or deformity Mouth/Throat: Oropharynx is without other ulcerations and moist  Neck: Normal range of motion. Neck supple. No JVD present. No  tracheal deviation present or significant neck LA or mass Cardiovascular: Normal rate, regular rhythm, normal heart sounds and intact distal pulses.   Pulmonary/Chest: WOB normal and breath sounds without rales or wheezing  Abdominal: Soft. Bowel sounds are somewhat decreased, mild distended, diffuse tender but worse to epigastric and left side, no guarding or rebound, No HSM  Musculoskeletal: Normal range of motion. Exhibits no edema Lymphadenopathy: Has no other cervical adenopathy.  Neurological: Pt is alert and oriented to person, place, and time. Pt has normal reflexes. No cranial nerve deficit. Motor grossly intact, Gait intact Skin: Skin is warm and dry. No rash noted or new ulcerations Psychiatric:  Has normal mood and affect. Behavior is normal without agitation All otherwise neg per pt  Lab Results  Component Value Date   WBC 10.9 (H) 09/02/2019   HGB 15.8 (H) 09/02/2019   HCT 48.9 (H) 09/02/2019   PLT 225 09/02/2019   GLUCOSE 170 (H) 09/02/2019   CHOL 237 (H) 09/03/2017   TRIG 302.0 (H) 09/03/2017   HDL 29.00 (L) 09/03/2017   LDLDIRECT 157.0 09/03/2017   LDLCALC 68 02/02/2014   ALT 61 (H) 03/24/2019   AST 44 (H) 03/24/2019   NA 136 09/02/2019   K 4.2 09/02/2019   CL 93 (L) 09/02/2019   CREATININE 0.36 (L) 09/02/2019   BUN 11 09/02/2019   CO2 30 09/02/2019   TSH 3.57 09/03/2017   HGBA1C 6.7 (H) 09/03/2017      Assessment & Plan:

## 2019-09-06 NOTE — Patient Instructions (Signed)
Please try the OTC Miralax for constipation  Please take the Linzess if this is not working  Please continue all other medications as before, and refills have been done if requested.  Please have the pharmacy call with any other refills you may need.  Please continue your efforts at being more active, low cholesterol diet, and weight control.  You are otherwise up to date with prevention measures today.  Please keep your appointments with your specialists as you may have planned  You will be contacted regarding the referral for: CT scan (hopefully later today)  Please go to the LAB in the Basement (turn left off the elevator) for the tests to be done today  You will be contacted by phone if any changes need to be made immediately.  Otherwise, you will receive a letter about your results with an explanation, but please check with MyChart first.  Please remember to sign up for MyChart if you have not done so, as this will be important to you in the future with finding out test results, communicating by private email, and scheduling acute appointments online when needed.  Please return in 6 months, or sooner if needed

## 2019-09-07 ENCOUNTER — Encounter: Payer: Self-pay | Admitting: Internal Medicine

## 2019-09-08 ENCOUNTER — Ambulatory Visit: Payer: No Typology Code available for payment source | Admitting: Internal Medicine

## 2019-09-09 ENCOUNTER — Other Ambulatory Visit: Payer: Self-pay

## 2019-09-09 ENCOUNTER — Ambulatory Visit (INDEPENDENT_AMBULATORY_CARE_PROVIDER_SITE_OTHER): Payer: No Typology Code available for payment source | Admitting: Internal Medicine

## 2019-09-09 DIAGNOSIS — K859 Acute pancreatitis without necrosis or infection, unspecified: Secondary | ICD-10-CM | POA: Diagnosis not present

## 2019-09-09 DIAGNOSIS — R739 Hyperglycemia, unspecified: Secondary | ICD-10-CM | POA: Diagnosis not present

## 2019-09-09 MED ORDER — HYDROCODONE-ACETAMINOPHEN 10-325 MG PO TABS: 1.0000 | ORAL_TABLET | Freq: Four times a day (QID) | ORAL | 0 refills | Status: AC | PRN

## 2019-09-09 NOTE — Progress Notes (Signed)
Patient ID: [name], female   DOB: 30-Nov-1955, 63 y.o.   MRN: [id number]  Phone visit  Cumulative time during 7-day interval 12 min, there was not an associated office visit for this concern within a 7 day period.  Verbal consent for services obtained from patient prior to services given.  Names of all persons present for services: Cathlean Cower, MD, patient  Chief complaint: persistent pain  History, background, results pertinent:  63yo F here with recent abd pain, with elevated lipase and CT now c/w acute pancreatitis (done in the [facility] system, with paper result to be scanned into this system) still without fever, n/v but pain still intense mod to severe, Denies worsening reflux, other abd pain, dysphagia, n/v, bowel change or blood.  Denies urinary symptoms such as dysuria, frequency, urgency, flank pain, hematuria or n/v, fever, chills.  Pt denies chest pain, increased sob or doe, wheezing, orthopnea, PND, increased LE swelling, palpitations, dizziness or syncope.  Admits to 1-2 glass brandy per day Past Medical History:  Diagnosis Date  . Bronchitis   . CAD (coronary artery disease)    a. 02/2002: CABG x3 with LIMA to LAD, RIMA to RCA, and SVG to OM  . CHF (congestive heart failure) ([place])   . COPD (chronic obstructive pulmonary disease) ([place])   . Headache   . Heart disease   . Hyperlipidemia   . Hypertension   . Impaired glucose tolerance 08/20/2014  . Sinusitis    No results found for this or any previous visit (from the past 48 hour(s)). Lab Results  Component Value Date   WBC 10.1 09/06/2019   HGB 15.4 (H) 09/06/2019   HCT 45.5 09/06/2019   PLT 208.0 09/06/2019   GLUCOSE 162 (H) 09/06/2019   CHOL 298 (H) 09/06/2019   TRIG 259.0 (H) 09/06/2019   HDL 35.10 (L) 09/06/2019   LDLDIRECT 210.0 09/06/2019   LDLCALC 68 02/02/2014   ALT 32 09/06/2019   AST 21 09/06/2019   NA 139 09/06/2019   K 3.8 09/06/2019   CL 96 09/06/2019   CREATININE 0.63 09/06/2019   BUN 14  09/06/2019   CO2 32 09/06/2019   TSH 3.25 09/06/2019   HGBA1C 7.5 (H) 09/06/2019   A/P/next steps:  See notes  Cathlean Cower MD

## 2019-09-11 ENCOUNTER — Encounter: Payer: Self-pay | Admitting: Internal Medicine

## 2019-09-11 NOTE — Assessment & Plan Note (Addendum)
With persistent pain, for low fat diet, ETOH abstain, pain control, and refer back to Dr Collene Mares GI

## 2019-09-11 NOTE — Assessment & Plan Note (Signed)
stable overall by history and exam, recent data reviewed with pt, and pt to continue medical treatment as before,  to f/u any worsening symptoms or concerns  

## 2019-09-11 NOTE — Patient Instructions (Signed)
Please take all new medication as prescribed  You will be contacted regarding the referral for: GI  Please avoid ETOH  Please continue all other medications as before, and refills have been done if requested.  Please have the pharmacy call with any other refills you may need.  Please keep your appointments with your specialists as you may have planned

## 2019-09-14 ENCOUNTER — Ambulatory Visit: Payer: No Typology Code available for payment source | Admitting: Cardiology

## 2019-09-17 ENCOUNTER — Encounter: Payer: Self-pay | Admitting: Internal Medicine

## 2019-09-17 DIAGNOSIS — K859 Acute pancreatitis without necrosis or infection, unspecified: Secondary | ICD-10-CM

## 2019-09-20 MED ORDER — METFORMIN HCL 500 MG PO TABS: 500.0000 mg | ORAL_TABLET | Freq: Every day | ORAL | 3 refills | Status: DC

## 2019-09-21 NOTE — Addendum Note (Signed)
Addended by: Biagio Borg on: 09/21/2019 12:33 PM   Modules accepted: Orders

## 2019-09-22 ENCOUNTER — Encounter: Payer: Self-pay | Admitting: Internal Medicine

## 2019-09-22 MED ORDER — HYDROCODONE-ACETAMINOPHEN 10-325 MG PO TABS: 1.0000 | ORAL_TABLET | Freq: Four times a day (QID) | ORAL | 0 refills | Status: DC | PRN

## 2019-10-09 ENCOUNTER — Other Ambulatory Visit: Payer: Self-pay | Admitting: Pulmonary Disease

## 2019-10-09 DIAGNOSIS — J449 Chronic obstructive pulmonary disease, unspecified: Secondary | ICD-10-CM

## 2019-10-25 ENCOUNTER — Encounter: Payer: Self-pay | Admitting: Internal Medicine

## 2019-10-25 NOTE — Telephone Encounter (Signed)
Pt reqeusting virtual appt

## 2019-10-26 ENCOUNTER — Encounter: Payer: Self-pay | Admitting: Internal Medicine

## 2019-10-26 ENCOUNTER — Ambulatory Visit (INDEPENDENT_AMBULATORY_CARE_PROVIDER_SITE_OTHER): Payer: No Typology Code available for payment source | Admitting: Internal Medicine

## 2019-10-26 DIAGNOSIS — J441 Chronic obstructive pulmonary disease with (acute) exacerbation: Secondary | ICD-10-CM

## 2019-10-26 MED ORDER — PREDNISONE 10 MG PO TABS: ORAL_TABLET | ORAL | 0 refills | Status: DC

## 2019-10-26 MED ORDER — HYDROCODONE-HOMATROPINE 5-1.5 MG/5ML PO SYRP: 5.0000 mL | ORAL_SOLUTION | Freq: Four times a day (QID) | ORAL | 0 refills | Status: AC | PRN

## 2019-10-26 MED ORDER — LEVOFLOXACIN 500 MG PO TABS: 500.0000 mg | ORAL_TABLET | Freq: Every day | ORAL | 0 refills | Status: AC

## 2019-10-26 NOTE — Progress Notes (Signed)
Patient ID: LIONA NIMMER, female   DOB: 06-Jul-1956, 64 y.o.   MRN: [id number]  Phone visit  Cumulative time during 7-day interval 12 min, there was not an associated office visit for this concern within a 7 day period.  Verbal consent for services obtained from patient prior to services given.  Names of all persons present for services: Cathlean Cower, MD, patient  Chief complaint: cough and congestion  History, background, results pertinent:  Here with acute onset mild to mod 2-3 days ST, HA, general weakness and malaise, with prod cough greenish sputum, but Pt denies chest pain, increased sob or doe, wheezing, orthopnea, PND, increased LE swelling, palpitations, dizziness or syncope except for onset mild wheezing since 2 days  No known covid contacts and declines testing for now Past Medical History:  Diagnosis Date  . Bronchitis   . CAD (coronary artery disease)    a. 02/2002: CABG x3 with LIMA to LAD, RIMA to RCA, and SVG to OM  . CHF (congestive heart failure) ([place])   . COPD (chronic obstructive pulmonary disease) ([place])   . Headache   . Heart disease   . Hyperlipidemia   . Hypertension   . Impaired glucose tolerance 08/20/2014  . Sinusitis    No results found for this or any previous visit (from the past 48 hour(s)). Current Outpatient Medications on File Prior to Visit  Medication Sig Dispense Refill  . albuterol (PROVENTIL HFA) 108 (90 Base) MCG/ACT inhaler INHALE 1-2 PUFFS INTO THE LUNGS EVERY 6 (SIX) HOURS AS NEEDED FOR WHEEZING OR SHORTNESS OF BREATH. (Patient taking differently: Inhale 1-2 puffs into the lungs every 6 (six) hours as needed for wheezing or shortness of breath. ) 6.7 Inhaler 5  . amLODipine (NORVASC) 5 MG tablet TAKE 1 TABLET BY MOUTH EVERY DAY (Patient taking differently: Take 5 mg by mouth daily. ) 30 tablet 9  . carvedilol (COREG) 12.5 MG tablet TAKE 1 TABLET (12.5 MG TOTAL) BY MOUTH 2 (TWO) TIMES DAILY. (Patient taking differently: Take 12.5 mg by mouth 2  (two) times daily. ) 60 tablet 9  . cholecalciferol (VITAMIN D) 1000 units tablet Take 1,000 Units by mouth daily.    Marland Kitchen diltiazem (CARDIZEM) 30 MG tablet Take 1 tablet (30 mg total) by mouth every 6 (six) hours as needed. 30 tablet 6  . fexofenadine (ALLEGRA) 180 MG tablet Take 1 tablet (180 mg total) by mouth daily. 90 tablet 3  . Fluticasone-Umeclidin-Vilant (TRELEGY ELLIPTA) 100-62.5-25 MCG/INH AEPB Inhale 1 puff into the lungs daily. 1 each 0  . furosemide (LASIX) 40 MG tablet TAKE 1 TABLET BY MOUTH TWICE A DAY 180 tablet 0  . ipratropium-albuterol (DUONEB) 0.5-2.5 (3) MG/3ML SOLN TAKE 3 MLS BY NEBULIZATION EVERY 6 (SIX) HOURS AS NEEDED. 360 mL 5  . isosorbide mononitrate (IMDUR) 60 MG 24 hr tablet TAKE 1 & 1/2 TABLETS BY MOUTH DAILY (Patient taking differently: Take 90 mg by mouth daily. ) 45 tablet 9  . KLOR-CON M10 10 MEQ tablet TAKE 1 TABLET BY MOUTH EVERY DAY 90 tablet 0  . metFORMIN (GLUCOPHAGE) 500 MG tablet Take 1 tablet (500 mg total) by mouth daily with breakfast. 90 tablet 3  . predniSONE (DELTASONE) 5 MG tablet TAKE 1 TABLET BY MOUTH EVERY DAY WITH BREAKFAST 30 tablet 1  . TRELEGY ELLIPTA 100-62.5-25 MCG/INH AEPB INHALE 1 PUFF BY MOUTH EVERY DAY 60 each 5  . triamcinolone (NASACORT) 55 MCG/ACT AERO nasal inhaler Place 2 sprays into the nose daily. 1 Inhaler 12  .  vitamin B-12 (CYANOCOBALAMIN) 1000 MCG tablet Take 1,000 mcg by mouth daily.    Marland Kitchen esomeprazole (NEXIUM) 40 MG capsule Take 1 capsule (40 mg total) by mouth daily. 30 capsule 0  . HYDROcodone-acetaminophen (NORCO) 10-325 MG tablet Take 1 tablet by mouth every 6 (six) hours as needed. 30 tablet 0  . ibuprofen (ADVIL) 200 MG tablet Take 200 mg by mouth every 6 (six) hours as needed for moderate pain.    Marland Kitchen linaclotide (LINZESS) 145 MCG CAPS capsule Take 1 capsule (145 mcg total) by mouth daily before breakfast. 30 capsule 5  . nicotine (NICODERM CQ - DOSED IN MG/24 HOURS) 21 mg/24hr patch PLACE 1 PATCH (21 MG TOTAL) ONTO THE  SKIN DAILY. 28 patch 1  . traZODone (DESYREL) 50 MG tablet TAKE 1/2 TO 1 TABLET BY MOUTH AT BEDTIME AS NEEDED FOR SLEEP 90 tablet 0  . vitamin C (ASCORBIC ACID) 500 MG tablet Take 1,000 mg by mouth daily.    . Vitamin D, Ergocalciferol, (DRISDOL) 1.25 MG (50000 UT) CAPS capsule Take 1 capsule (50,000 Units total) by mouth every 7 (seven) days. 12 capsule 0   No current facility-administered medications on file prior to visit.   Lab Results  Component Value Date   WBC 10.1 09/06/2019   HGB 15.4 (H) 09/06/2019   HCT 45.5 09/06/2019   PLT 208.0 09/06/2019   GLUCOSE 162 (H) 09/06/2019   CHOL 298 (H) 09/06/2019   TRIG 259.0 (H) 09/06/2019   HDL 35.10 (L) 09/06/2019   LDLDIRECT 210.0 09/06/2019   LDLCALC 68 02/02/2014   ALT 32 09/06/2019   AST 21 09/06/2019   NA 139 09/06/2019   K 3.8 09/06/2019   CL 96 09/06/2019   CREATININE 0.63 09/06/2019   BUN 14 09/06/2019   CO2 32 09/06/2019   TSH 3.25 09/06/2019   HGBA1C 7.5 (H) 09/06/2019   A/P/next steps:   Cough with wheezing - mild to mod, for antibiotics, cough med prn, predpac asd, mucniex and to UC or ED for worsening  Cathlean Cower MD

## 2019-10-31 ENCOUNTER — Encounter: Payer: Self-pay | Admitting: Internal Medicine

## 2019-10-31 NOTE — Assessment & Plan Note (Signed)
See notes

## 2019-10-31 NOTE — Patient Instructions (Signed)
Please take all new medication as prescribed 

## 2019-11-13 ENCOUNTER — Other Ambulatory Visit: Payer: Self-pay | Admitting: Internal Medicine

## 2019-11-13 NOTE — Telephone Encounter (Signed)
Please refill as per office routine med refill policy (all routine meds refilled for 3 mo or monthly per pt preference up to one year from last visit, then month to month grace period for 3 mo, then further med refills will have to be denied)  

## 2019-11-15 DIAGNOSIS — J449 Chronic obstructive pulmonary disease, unspecified: Secondary | ICD-10-CM | POA: Diagnosis not present

## 2019-11-23 ENCOUNTER — Other Ambulatory Visit: Payer: Self-pay | Admitting: Internal Medicine

## 2019-11-26 ENCOUNTER — Telehealth: Payer: Self-pay

## 2019-11-26 DIAGNOSIS — J449 Chronic obstructive pulmonary disease, unspecified: Secondary | ICD-10-CM

## 2019-11-26 DIAGNOSIS — G4734 Idiopathic sleep related nonobstructive alveolar hypoventilation: Secondary | ICD-10-CM

## 2019-11-26 DIAGNOSIS — J309 Allergic rhinitis, unspecified: Secondary | ICD-10-CM

## 2019-11-26 NOTE — Telephone Encounter (Signed)
Received fax request from pharmacy prefers Provental Inhaler requesting refill

## 2019-11-27 MED ORDER — ALBUTEROL SULFATE HFA 108 (90 BASE) MCG/ACT IN AERS: INHALATION_SPRAY | RESPIRATORY_TRACT | 2 refills | Status: DC

## 2019-11-29 ENCOUNTER — Encounter: Payer: Self-pay | Admitting: Internal Medicine

## 2019-12-05 ENCOUNTER — Other Ambulatory Visit: Payer: Self-pay | Admitting: Pulmonary Disease

## 2019-12-05 DIAGNOSIS — J449 Chronic obstructive pulmonary disease, unspecified: Secondary | ICD-10-CM

## 2019-12-14 ENCOUNTER — Other Ambulatory Visit: Payer: Self-pay | Admitting: Cardiology

## 2019-12-16 DIAGNOSIS — J449 Chronic obstructive pulmonary disease, unspecified: Secondary | ICD-10-CM | POA: Diagnosis not present

## 2019-12-31 ENCOUNTER — Other Ambulatory Visit: Payer: Self-pay | Admitting: Pulmonary Disease

## 2019-12-31 DIAGNOSIS — J449 Chronic obstructive pulmonary disease, unspecified: Secondary | ICD-10-CM

## 2020-01-03 ENCOUNTER — Telehealth: Payer: Self-pay | Admitting: Pulmonary Disease

## 2020-01-03 ENCOUNTER — Ambulatory Visit (INDEPENDENT_AMBULATORY_CARE_PROVIDER_SITE_OTHER): Payer: BC Managed Care – PPO | Admitting: Internal Medicine

## 2020-01-03 ENCOUNTER — Other Ambulatory Visit: Payer: Self-pay

## 2020-01-03 ENCOUNTER — Encounter: Payer: Self-pay | Admitting: Internal Medicine

## 2020-01-03 VITALS — BP 122/72 | HR 64 | Temp 99.0°F | Ht 62.0 in | Wt 181.0 lb

## 2020-01-03 DIAGNOSIS — E119 Type 2 diabetes mellitus without complications: Secondary | ICD-10-CM | POA: Diagnosis not present

## 2020-01-03 DIAGNOSIS — E785 Hyperlipidemia, unspecified: Secondary | ICD-10-CM

## 2020-01-03 DIAGNOSIS — I10 Essential (primary) hypertension: Secondary | ICD-10-CM

## 2020-01-03 DIAGNOSIS — E559 Vitamin D deficiency, unspecified: Secondary | ICD-10-CM | POA: Diagnosis not present

## 2020-01-03 LAB — POCT GLYCOSYLATED HEMOGLOBIN (HGB A1C): Hemoglobin A1C: 6.7 % — AB (ref 4.0–5.6)

## 2020-01-03 MED ORDER — ATORVASTATIN CALCIUM 40 MG PO TABS: 40.0000 mg | ORAL_TABLET | Freq: Every day | ORAL | 3 refills | Status: DC

## 2020-01-03 MED ORDER — METFORMIN HCL ER 500 MG PO TB24: 500.0000 mg | ORAL_TABLET | Freq: Every day | ORAL | 3 refills | Status: DC

## 2020-01-03 NOTE — Assessment & Plan Note (Signed)
stable overall by history and exam, recent data reviewed with pt, and pt to continue medical treatment as before,  to f/u any worsening symptoms or concerns  

## 2020-01-03 NOTE — Assessment & Plan Note (Addendum)
Uncontrolled, to start metformin ER 500,  to f/u any worsening symptoms or concerns  I spent 30 minutes in preparing to see the patient by review of recent labs, imaging and procedures, obtaining and reviewing separately obtained history, communicating with the patient and family or caregiver, ordering medications, tests or procedures, and documenting clinical information in the EHR including the differential Dx, treatment, and any further evaluation and other management of DM, HTN, HLD, VIt D deficiency

## 2020-01-03 NOTE — Assessment & Plan Note (Signed)
For oral replacement 

## 2020-01-03 NOTE — Patient Instructions (Signed)
Please take all new medication as prescribed - the metformin for sugar, and the lipitor for cholesterol  Please continue all other medications as before, and refills have been done if requested.  Please have the pharmacy call with any other refills you may need.  Please continue your efforts at being more active, low cholesterol diet, and weight control.  Please keep your appointments with your specialists as you may have planned  Please make an Appointment to return in 3 months, or sooner if needed, also with Lab Appointment for testing done 3-5 days before at the [facility] (so this is for TWO appointments - please see the scheduling desk as you leave)

## 2020-01-03 NOTE — Telephone Encounter (Signed)
There is also a Pharmacist, community message stating that pt is requesting prednisone refill, but Aaron Edelman has said that pt needs an OV before this can be refilled. Called pt, scheduled for an OV tomorrow afternoon.  Nothing further needed at this time- will close encounter.

## 2020-01-03 NOTE — Progress Notes (Signed)
Subjective:    Patient ID: [name], female    DOB: 1956-06-27, 64 y.o.   MRN: [id number]  HPI  Here to f/u; overall doing ok,  Pt denies chest pain, increasing sob or doe, wheezing, orthopnea, PND, increased LE swelling, palpitations, dizziness or syncope.  Pt denies new neurological symptoms such as new headache, or facial or extremity weakness or numbness.  Pt denies polydipsia, polyuria, or low sugar episode.  Pt states overall good compliance with meds, mostly trying to follow appropriate diet, with wt overall stable,  but little exercise however.  CBGs at home have been 200-300s and did not start OHA from last visit - "I was in denieal" Now willing to start tx Past Medical History:  Diagnosis Date  . Bronchitis   . CAD (coronary artery disease)    a. 02/2002: CABG x3 with LIMA to LAD, RIMA to RCA, and SVG to OM  . CHF (congestive heart failure) ([place])   . COPD (chronic obstructive pulmonary disease) ([place])   . Headache   . Heart disease   . Hyperlipidemia   . Hypertension   . Impaired glucose tolerance 08/20/2014  . Sinusitis    Past Surgical History:  Procedure Laterality Date  . CORONARY ARTERY BYPASS GRAFT  2003   Triple bypass  . ESOPHAGOGASTRODUODENOSCOPY Left 06/13/2014   Procedure: ESOPHAGOGASTRODUODENOSCOPY (EGD);  Surgeon: Juanita Craver, MD;  Location: [facility] ENDOSCOPY;  Service: Endoscopy;  Laterality: Left;  . RIGID ESOPHAGOSCOPY N/A 06/12/2014   Procedure: RIGID ESOPHAGOSCOPY WITH FOREIGN BODY REMOVAL;  Surgeon: Ascencion Dike, MD;  Location: [facility] OR;  Service: ENT;  Laterality: N/A;    reports that she quit smoking about 7 months ago. Her smoking use included cigarettes. She started smoking about 48 years ago. She has a 23.50 pack-year smoking history. She has never used smokeless tobacco. She reports current alcohol use of about 5.0 - 6.0 standard drinks of alcohol per week. She reports current drug use. Drug: Marijuana. family history includes Brain cancer in her mother;  Cancer in her maternal grandmother and another family member; Heart disease in her mother; Lung cancer in her maternal aunt and paternal uncle; Stroke in an other family member; Suicidality in her brother. Allergies  Allergen Reactions  . Augmentin [Amoxicillin-Pot Clavulanate] Nausea And Vomiting   Current Outpatient Medications on File Prior to Visit  Medication Sig Dispense Refill  . albuterol (PROVENTIL HFA) 108 (90 Base) MCG/ACT inhaler INHALE 1-2 PUFFS INTO THE LUNGS EVERY 6 (SIX) HOURS AS NEEDED FOR WHEEZING OR SHORTNESS OF BREATH. 18 g 2  . amLODipine (NORVASC) 5 MG tablet TAKE 1 TABLET BY MOUTH EVERY DAY 90 tablet 0  . carvedilol (COREG) 12.5 MG tablet TAKE 1 TABLET (12.5 MG TOTAL) BY MOUTH 2 (TWO) TIMES DAILY. (Patient taking differently: Take 12.5 mg by mouth 2 (two) times daily. ) 60 tablet 9  . cholecalciferol (VITAMIN D) 1000 units tablet Take 1,000 Units by mouth daily.    Marland Kitchen diltiazem (CARDIZEM) 30 MG tablet Take 1 tablet (30 mg total) by mouth every 6 (six) hours as needed. 30 tablet 6  . ipratropium-albuterol (DUONEB) 0.5-2.5 (3) MG/3ML SOLN TAKE 3 MLS BY NEBULIZATION EVERY 6 (SIX) HOURS AS NEEDED. 360 mL 5  . isosorbide mononitrate (IMDUR) 60 MG 24 hr tablet TAKE 1 & 1/2 TABLETS BY MOUTH DAILY (Patient taking differently: Take 90 mg by mouth daily. ) 45 tablet 9  . TRELEGY ELLIPTA 100-62.5-25 MCG/INH AEPB INHALE 1 PUFF BY MOUTH EVERY DAY 60 each  5  . triamcinolone (NASACORT) 55 MCG/ACT AERO nasal inhaler Place 2 sprays into the nose daily. 1 Inhaler 12  . vitamin B-12 (CYANOCOBALAMIN) 1000 MCG tablet Take 1,000 mcg by mouth daily.    . vitamin C (ASCORBIC ACID) 500 MG tablet Take 1,000 mg by mouth daily.    Marland Kitchen esomeprazole (NEXIUM) 40 MG capsule Take 1 capsule (40 mg total) by mouth daily. 30 capsule 0  . fexofenadine (ALLEGRA) 180 MG tablet Take 1 tablet (180 mg total) by mouth daily. (Patient not taking: Reported on 01/03/2020) 90 tablet 3  . Fluticasone-Umeclidin-Vilant  (TRELEGY ELLIPTA) 100-62.5-25 MCG/INH AEPB Inhale 1 puff into the lungs daily. (Patient not taking: Reported on 01/03/2020) 1 each 0  . furosemide (LASIX) 40 MG tablet TAKE 1 TABLET BY MOUTH TWICE A DAY 180 tablet 0  . HYDROcodone-acetaminophen (NORCO) 10-325 MG tablet Take 1 tablet by mouth every 6 (six) hours as needed. 30 tablet 0  . ibuprofen (ADVIL) 200 MG tablet Take 200 mg by mouth every 6 (six) hours as needed for moderate pain.    Marland Kitchen KLOR-CON M10 10 MEQ tablet TAKE 1 TABLET BY MOUTH EVERY DAY 90 tablet 0  . linaclotide (LINZESS) 145 MCG CAPS capsule Take 1 capsule (145 mcg total) by mouth daily before breakfast. 30 capsule 5  . nicotine (NICODERM CQ - DOSED IN MG/24 HOURS) 21 mg/24hr patch PLACE 1 PATCH (21 MG TOTAL) ONTO THE SKIN DAILY. 28 patch 1  . predniSONE (DELTASONE) 10 MG tablet 3 tabs by mouth per day for 3 days,2tabs per day for 3 days,1tab per day for 3 days (Patient not taking: Reported on 01/03/2020) 18 tablet 0  . predniSONE (DELTASONE) 5 MG tablet TAKE 1 TABLET BY MOUTH EVERY DAY WITH BREAKFAST (Patient not taking: Reported on 01/03/2020) 30 tablet 0  . traZODone (DESYREL) 50 MG tablet TAKE 1/2 TO 1 TABLET BY MOUTH AT BEDTIME AS NEEDED FOR SLEEP 90 tablet 0  . Vitamin D, Ergocalciferol, (DRISDOL) 1.25 MG (50000 UT) CAPS capsule Take 1 capsule (50,000 Units total) by mouth every 7 (seven) days. 12 capsule 0   No current facility-administered medications on file prior to visit.   Review of Systems All otherwise neg per pt     Objective:   Physical Exam BP 122/72   Pulse 64   Temp 99 F (37.2 C)   Ht 5\' 2"  (1.575 m)   Wt 181 lb (82.1 kg)   SpO2 98%   BMI 33.11 kg/m  VS noted,  Constitutional: Pt appears in NAD HENT: Head: NCAT.  Right Ear: External ear normal.  Left Ear: External ear normal.  Eyes: . Pupils are equal, round, and reactive to light. Conjunctivae and EOM are normal Nose: without d/c or deformity Neck: Neck supple. Gross normal ROM Cardiovascular:  Normal rate and regular rhythm.   Pulmonary/Chest: Effort normal and breath sounds without rales or wheezing.  Abd:  Soft, NT, ND, + BS, no organomegaly Neurological: Pt is alert. At baseline orientation, motor grossly intact Skin: Skin is warm. No rashes, other new lesions, no LE edema Psychiatric: Pt behavior is normal without agitation  All otherwise neg per pt   Lab Results  Component Value Date   WBC 10.1 09/06/2019   HGB 15.4 (H) 09/06/2019   HCT 45.5 09/06/2019   PLT 208.0 09/06/2019   GLUCOSE 162 (H) 09/06/2019   CHOL 298 (H) 09/06/2019   TRIG 259.0 (H) 09/06/2019   HDL 35.10 (L) 09/06/2019   LDLDIRECT 210.0 09/06/2019  LDLCALC 68 02/02/2014   ALT 32 09/06/2019   AST 21 09/06/2019   NA 139 09/06/2019   K 3.8 09/06/2019   CL 96 09/06/2019   CREATININE 0.63 09/06/2019   BUN 14 09/06/2019   CO2 32 09/06/2019   TSH 3.25 09/06/2019   HGBA1C 7.5 (H) 09/06/2019   POCT HgB A1C Order: [id number] Status:  Final result Visible to patient:  Yes (MyChart) Dx:  Type 2 diabetes mellitus without comp...  Ref Range & Units 17:16 3 mo ago 2 yr ago 3 yr ago 4 yr ago 6 yr ago  Hemoglobin A1C 4.0 - 5.6 % 6.7Abnormal   7.5High  R, CM  6.7High  R, CM  6.1High  R, CM  6.3 R, CM  5.8           Assessment & Plan:

## 2020-01-03 NOTE — Assessment & Plan Note (Signed)
Goal ldl < 70, for statin asd, ow chl diet,  to f/u any worsening symptoms or concerns

## 2020-01-04 ENCOUNTER — Ambulatory Visit: Payer: BC Managed Care – PPO | Admitting: Primary Care

## 2020-01-07 ENCOUNTER — Other Ambulatory Visit: Payer: Self-pay

## 2020-01-07 ENCOUNTER — Encounter: Payer: Self-pay | Admitting: Pulmonary Disease

## 2020-01-07 ENCOUNTER — Ambulatory Visit: Payer: BC Managed Care – PPO | Admitting: Primary Care

## 2020-01-07 ENCOUNTER — Ambulatory Visit: Payer: BC Managed Care – PPO | Admitting: Pulmonary Disease

## 2020-01-07 VITALS — BP 114/80 | HR 81 | Temp 98.4°F | Ht 62.0 in | Wt 181.0 lb

## 2020-01-07 DIAGNOSIS — J9611 Chronic respiratory failure with hypoxia: Secondary | ICD-10-CM | POA: Diagnosis not present

## 2020-01-07 DIAGNOSIS — J309 Allergic rhinitis, unspecified: Secondary | ICD-10-CM | POA: Diagnosis not present

## 2020-01-07 DIAGNOSIS — J4489 Other specified chronic obstructive pulmonary disease: Secondary | ICD-10-CM

## 2020-01-07 DIAGNOSIS — Z87891 Personal history of nicotine dependence: Secondary | ICD-10-CM | POA: Diagnosis not present

## 2020-01-07 DIAGNOSIS — J449 Chronic obstructive pulmonary disease, unspecified: Secondary | ICD-10-CM | POA: Diagnosis not present

## 2020-01-07 MED ORDER — PREDNISONE 5 MG PO TABS: ORAL_TABLET | ORAL | 2 refills | Status: DC

## 2020-01-07 NOTE — Assessment & Plan Note (Signed)
Plan: Continue Allegra Can use Nasacort as needed for nasal congestion Start nasal saline rinses, sample provided today

## 2020-01-07 NOTE — Assessment & Plan Note (Signed)
Plan: Continue Trelegy Ellipta We will refill prednisone, continue 5 mg prednisone daily Start nasal saline rinses Continue Allegra Continue to not smoke Follow-up in 3 months

## 2020-01-07 NOTE — Progress Notes (Signed)
@Patient  ID: [name], female    DOB: 16-Sep-1956, 64 y.o.   MRN: [id number]  Chief Complaint  Patient presents with  . Follow-up    F/U for prednisone. States she is more SOB. Not completely out of prednisone yet.     Referring provider: Biagio Borg, MD  HPI:  64 year old female former smoker followed in our office for chronic GOLD COPD IV  PMH: GERD Smoker/ Smoking History: Former Smoker. Quit in August/2020. 23-pack-year. Maintenance: Trelegy Ellipta, pred 5mg   Pt of: Dr. Elsworth Soho  01/07/2020  - Visit   64 year old female former smoker followed in our office for gold COPD stage IV.  Patient is maintained on Trelegy Ellipta.  She is presenting today for refill of her maintenance daily prednisone of 5 mg.  Patient has a history of frequent exacerbations.  These exacerbations have improved since starting daily maintenance prednisone as well as stopping smoking in August/2020.  She is followed in our office by Dr. Elsworth Soho.  Patient also continues to have allergy-like symptoms and nasal congestion.  She finds immediate relief when she is able to clear her sinuses/nares of congestion.  She is maintained on Allegra.  She has not started nasal saline rinses as previously instructed.  Questionaires / Pulmonary Flowsheets:   MMRC: mMRC Dyspnea Scale mMRC Score  01/07/2020 2    Tests:   07/15/2019 - Sars COV2 - negative   03/07/2017-spirometry- severe airway obstruction FEV1 42  05/03/2019-pulmonary function test- FVC 1.05 (44% predicted), postbronchodilator ratio 47, postbronchodilator FEV1 0.47 (25% predicted), DLCO 54  01/11/18-chest x-ray-improved cardiomegaly and pulmonary vascular congestion, stable mild changes of COPD  12/09/2016-CT Angio- no radiographic evidence of pulmonary embolism    02/09/2017-echocardiogram LV ejection fraction 60 to 123456, grade 2 diastolic dysfunction  [id number] - CBC with Diff - eos relative 3.6, eos absolute 0.3  FENO:  No results found for:  NITRICOXIDE  PFT: PFT Results Latest Ref Rng & Units 05/03/2019  FVC-Pre L 1.05  FVC-Predicted Pre % 44  FVC-Post L 1.00  FVC-Predicted Post % 42  Pre FEV1/FVC % % 49  Post FEV1/FCV % % 47  FEV1-Pre L 0.52  FEV1-Predicted Pre % 28  FEV1-Post L 0.47  DLCO UNC% % 54  DLCO COR %Predicted % 87  TLC L 3.81  TLC % Predicted % 80  RV % Predicted % 136    WALK:  No flowsheet data found.  Imaging: No results found.  Lab Results:  CBC    Component Value Date/Time   WBC 10.1 09/06/2019 1229   RBC 5.17 (H) 09/06/2019 1229   HGB 15.4 (H) 09/06/2019 1229   HGB 15.7 06/12/2014 1543   HCT 45.5 09/06/2019 1229   HCT 48.3 (H) 06/12/2014 1543   PLT 208.0 09/06/2019 1229   PLT 178 06/12/2014 1543   MCV 88.1 09/06/2019 1229   MCV 91 06/12/2014 1543   MCH 29.2 09/02/2019 1936   MCHC 33.7 09/06/2019 1229   RDW 13.6 09/06/2019 1229   RDW 14.8 (H) 06/12/2014 1543   LYMPHSABS 2.3 09/06/2019 1229   MONOABS 0.8 09/06/2019 1229   EOSABS 0.2 09/06/2019 1229   BASOSABS 0.1 09/06/2019 1229    BMET    Component Value Date/Time   NA 139 09/06/2019 1229   NA 142 06/12/2014 1543   K 3.8 09/06/2019 1229   K 3.6 06/12/2014 1543   CL 96 09/06/2019 1229   CL 105 06/12/2014 1543   CO2 32 09/06/2019 1229   CO2 29 06/12/2014 1543  GLUCOSE 162 (H) 09/06/2019 1229   GLUCOSE 113 (H) 06/12/2014 1543   BUN 14 09/06/2019 1229   BUN 12 06/12/2014 1543   CREATININE 0.63 09/06/2019 1229   CREATININE 0.62 06/12/2014 1543   CALCIUM 10.0 09/06/2019 1229   CALCIUM 8.9 06/12/2014 1543   GFRNONAA >60 09/02/2019 1936   GFRNONAA >60 06/12/2014 1543   GFRAA >60 09/02/2019 1936   GFRAA >60 06/12/2014 1543    BNP    Component Value Date/Time   BNP 30.5 06/26/2019 2009    ProBNP    Component Value Date/Time   PROBNP 45.0 03/24/2019 1213    Specialty Problems      Pulmonary Problems   COPD (chronic obstructive pulmonary disease) with chronic bronchitis (HCC)    03/07/2017-spirometry- severe  airway obstruction FEV1 42  01/11/18-chest x-ray-improved cardiomegaly and pulmonary vascular congestion, stable mild changes of COPD  05/03/2019-pulmonary function test- FVC 1.05 (44% predicted), postbronchodilator ratio 47, postbronchodilator FEV1 0.47 (25% predicted), DLCO 54       Allergic rhinitis   5 mm Lung nodule, solitary   Nocturnal hypoxemia   Cough   Sinusitis, acute   COPD exacerbation (HCC)   Chronic respiratory failure with hypoxia (HCC)      Allergies  Allergen Reactions  . Augmentin [Amoxicillin-Pot Clavulanate] Nausea And Vomiting    Immunization History  Administered Date(s) Administered  . Influenza Split 07/08/2013  . Influenza,inj,Quad PF,6+ Mos 07/31/2015, 09/03/2017  . Pneumococcal Polysaccharide-23 02/02/2014, 05/03/2019    Past Medical History:  Diagnosis Date  . Bronchitis   . CAD (coronary artery disease)    a. 02/2002: CABG x3 with LIMA to LAD, RIMA to RCA, and SVG to OM  . CHF (congestive heart failure) ([place])   . COPD (chronic obstructive pulmonary disease) ([place])   . Headache   . Heart disease   . Hyperlipidemia   . Hypertension   . Impaired glucose tolerance 08/20/2014  . Sinusitis     Tobacco History: Social History   Tobacco Use  Smoking Status Former Smoker  . Packs/day: 0.50  . Years: 47.00  . Pack years: 23.50  . Types: Cigarettes  . Start date: 12/29/1971  . Quit date: 05/22/2019  . Years since quitting: 0.6  Smokeless Tobacco Never Used   Counseling given: Yes   Continue to not smoke  Outpatient Encounter Medications as of 01/07/2020  Medication Sig  . albuterol (PROVENTIL HFA) 108 (90 Base) MCG/ACT inhaler INHALE 1-2 PUFFS INTO THE LUNGS EVERY 6 (SIX) HOURS AS NEEDED FOR WHEEZING OR SHORTNESS OF BREATH.  Marland Kitchen amLODipine (NORVASC) 5 MG tablet TAKE 1 TABLET BY MOUTH EVERY DAY  . atorvastatin (LIPITOR) 40 MG tablet Take 1 tablet (40 mg total) by mouth daily.  . carvedilol (COREG) 12.5 MG tablet TAKE 1 TABLET (12.5 MG  TOTAL) BY MOUTH 2 (TWO) TIMES DAILY. (Patient taking differently: Take 12.5 mg by mouth 2 (two) times daily. )  . cholecalciferol (VITAMIN D) 1000 units tablet Take 1,000 Units by mouth daily.  Marland Kitchen diltiazem (CARDIZEM) 30 MG tablet Take 1 tablet (30 mg total) by mouth every 6 (six) hours as needed.  . fexofenadine (ALLEGRA) 180 MG tablet Take 1 tablet (180 mg total) by mouth daily.  . furosemide (LASIX) 40 MG tablet TAKE 1 TABLET BY MOUTH TWICE A DAY  . ipratropium-albuterol (DUONEB) 0.5-2.5 (3) MG/3ML SOLN TAKE 3 MLS BY NEBULIZATION EVERY 6 (SIX) HOURS AS NEEDED.  Marland Kitchen isosorbide mononitrate (IMDUR) 60 MG 24 hr tablet TAKE 1 & 1/2 TABLETS BY MOUTH  DAILY (Patient taking differently: Take 90 mg by mouth daily. )  . KLOR-CON M10 10 MEQ tablet TAKE 1 TABLET BY MOUTH EVERY DAY  . metFORMIN (GLUCOPHAGE-XR) 500 MG 24 hr tablet Take 1 tablet (500 mg total) by mouth daily with breakfast.  . predniSONE (DELTASONE) 5 MG tablet TAKE 1 TABLET BY MOUTH EVERY DAY WITH BREAKFAST  . TRELEGY ELLIPTA 100-62.5-25 MCG/INH AEPB INHALE 1 PUFF BY MOUTH EVERY DAY  . triamcinolone (NASACORT) 55 MCG/ACT AERO nasal inhaler Place 2 sprays into the nose daily.  . vitamin B-12 (CYANOCOBALAMIN) 1000 MCG tablet Take 1,000 mcg by mouth daily.  . vitamin C (ASCORBIC ACID) 500 MG tablet Take 1,000 mg by mouth daily.  . [DISCONTINUED] predniSONE (DELTASONE) 5 MG tablet TAKE 1 TABLET BY MOUTH EVERY DAY WITH BREAKFAST  . [DISCONTINUED] esomeprazole (NEXIUM) 40 MG capsule Take 1 capsule (40 mg total) by mouth daily.  . [DISCONTINUED] Fluticasone-Umeclidin-Vilant (TRELEGY ELLIPTA) 100-62.5-25 MCG/INH AEPB Inhale 1 puff into the lungs daily. (Patient not taking: Reported on 01/03/2020)  . [DISCONTINUED] HYDROcodone-acetaminophen (NORCO) 10-325 MG tablet Take 1 tablet by mouth every 6 (six) hours as needed.  . [DISCONTINUED] ibuprofen (ADVIL) 200 MG tablet Take 200 mg by mouth every 6 (six) hours as needed for moderate pain.  . [DISCONTINUED]  linaclotide (LINZESS) 145 MCG CAPS capsule Take 1 capsule (145 mcg total) by mouth daily before breakfast.  . [DISCONTINUED] nicotine (NICODERM CQ - DOSED IN MG/24 HOURS) 21 mg/24hr patch PLACE 1 PATCH (21 MG TOTAL) ONTO THE SKIN DAILY.  . [DISCONTINUED] predniSONE (DELTASONE) 10 MG tablet 3 tabs by mouth per day for 3 days,2tabs per day for 3 days,1tab per day for 3 days (Patient not taking: Reported on 01/03/2020)  . [DISCONTINUED] traZODone (DESYREL) 50 MG tablet TAKE 1/2 TO 1 TABLET BY MOUTH AT BEDTIME AS NEEDED FOR SLEEP  . [DISCONTINUED] Vitamin D, Ergocalciferol, (DRISDOL) 1.25 MG (50000 UT) CAPS capsule Take 1 capsule (50,000 Units total) by mouth every 7 (seven) days.   No facility-administered encounter medications on file as of 01/07/2020.     Review of Systems  Review of Systems  Constitutional: Positive for fatigue. Negative for activity change and fever.  HENT: Positive for congestion. Negative for sinus pressure, sinus pain and sore throat.   Respiratory: Positive for cough and wheezing. Negative for shortness of breath.   Cardiovascular: Negative for chest pain and palpitations.  Gastrointestinal: Negative for diarrhea, nausea and vomiting.  Musculoskeletal: Negative for arthralgias.  Neurological: Negative for dizziness.  Psychiatric/Behavioral: Negative for sleep disturbance. The patient is not nervous/anxious.      Physical Exam  BP 114/80   Pulse 81   Temp 98.4 F (36.9 C) (Temporal)   Ht 5\' 2"  (1.575 m)   Wt 181 lb (82.1 kg)   SpO2 99% Comment: on 4L  BMI 33.11 kg/m   Wt Readings from Last 5 Encounters:  01/07/20 181 lb (82.1 kg)  01/03/20 181 lb (82.1 kg)  10/26/19 175 lb (79.4 kg)  09/06/19 180 lb 12.8 oz (82 kg)  05/18/19 183 lb (83 kg)    BMI Readings from Last 5 Encounters:  01/07/20 33.11 kg/m  01/03/20 33.11 kg/m  10/26/19 32.01 kg/m  09/06/19 33.07 kg/m  05/18/19 33.47 kg/m     Physical Exam Vitals and nursing note reviewed.    Constitutional:      General: She is not in acute distress.    Appearance: Normal appearance. She is normal weight.  HENT:     Head: Normocephalic  and atraumatic.     Right Ear: External ear normal.     Left Ear: External ear normal.     Nose: Congestion and rhinorrhea present.     Mouth/Throat:     Mouth: Mucous membranes are moist.     Pharynx: Oropharynx is clear.  Eyes:     Pupils: Pupils are equal, round, and reactive to light.  Cardiovascular:     Rate and Rhythm: Normal rate and regular rhythm.     Pulses: Normal pulses.     Heart sounds: Normal heart sounds. No murmur.  Pulmonary:     Effort: Pulmonary effort is normal. No respiratory distress.     Breath sounds: No decreased air movement. No decreased breath sounds, wheezing or rales.     Comments: Diminished breath sounds throughout exam Musculoskeletal:     Cervical back: Normal range of motion.  Skin:    General: Skin is warm and dry.     Capillary Refill: Capillary refill takes less than 2 seconds.  Neurological:     General: No focal deficit present.     Mental Status: She is alert and oriented to person, place, and time. Mental status is at baseline.     Gait: Gait normal.  Psychiatric:        Mood and Affect: Mood normal.        Behavior: Behavior normal.        Thought Content: Thought content normal.        Judgment: Judgment normal.       Assessment & Plan:   Allergic rhinitis Plan: Continue Allegra Can use Nasacort as needed for nasal congestion Start nasal saline rinses, sample provided today  Chronic respiratory failure with hypoxia (HCC) Plan: Continue oxygen therapy as prescribed Maintain oxygen saturations above 88%, goal oxygen saturations 88 to 92%  COPD (chronic obstructive pulmonary disease) with chronic bronchitis (HCC) Plan: Continue Trelegy Ellipta We will refill prednisone, continue 5 mg prednisone daily Start nasal saline rinses Continue Allegra Continue to not  smoke Follow-up in 3 months    Return in about 3 months (around 04/08/2020), or if symptoms worsen or fail to improve, for Follow up with Dr. Elsworth Soho.   Lauraine Rinne, NP 01/07/2020   This appointment required 34 minutes of patient care (this includes precharting, chart review, review of results, face-to-face care, etc.).

## 2020-01-07 NOTE — Assessment & Plan Note (Signed)
Plan: Continue oxygen therapy as prescribed Maintain oxygen saturations above 88%, goal oxygen saturations 88 to 92% 

## 2020-01-07 NOTE — Patient Instructions (Addendum)
You were seen today by Lauraine Rinne, NP  for:   It was good seeing you today. Overall you had a good physical exam. I am happy with this. We will refill your prednisone 5 mg daily. Continue Trelegy Ellipta. We will have you start doing nasal saline rinses in addition to maintaining your Allegra to see if I can help with your nasal congestion. Continue oxygen therapy as prescribed.  We will see you back in our office in around 2 to 3 months. Take care and stay safe,  [name]  1. COPD (chronic obstructive pulmonary disease) with chronic bronchitis (HCC)  Trelegy Ellipta  >>> 1 puff daily in the morning >>>rinse mouth out after use  >>> This inhaler contains 3 medications that help manage her respiratory status, contact our office if you cannot afford this medication or unable to remain on this medication  Continue prednisone 5mg  daily   Only use your albuterol as a rescue medication to be used if you can't catch your breath by resting or doing a relaxed purse lip breathing pattern.  - The less you use it, the better it will work when you need it. - Ok to use up to 2 puffs  every 4 hours if you must but call for immediate appointment if use goes up over your usual need - Don't leave home without it !!  (think of it like the spare tire for your car)   Note your daily symptoms > remember "red flags" for COPD:   >>>Increase in cough >>>increase in sputum production >>>increase in shortness of breath or activity  intolerance.   If you notice these symptoms, please call the office to be seen.   2. Allergic rhinitis, unspecified seasonality, unspecified trigger  Continue Allegra daily  Start nasal saline rinses 1-2 times daily Sample provided today Use distilled water Get water lukewarm like a baby bottle Shake well   3. Chronic respiratory failure with hypoxia (HCC)  Continue oxygen therapy as prescribed  >>>maintain oxygen saturations greater than 88 percent  >>>if unable to  maintain oxygen saturations please contact the office  >>>do not smoke with oxygen  >>>can use nasal saline gel or nasal saline rinses to moisturize nose if oxygen causes dryness  4. Former smoker  Continue to not smoke     Follow Up:    Return in about 3 months (around 04/08/2020), or if symptoms worsen or fail to improve, for Follow up with Dr. Elsworth Soho.   Please do your part to reduce the spread of COVID-19:      Reduce your risk of any infection  and COVID19 by using the similar precautions used for avoiding the common cold or flu:  Marland Kitchen Wash your hands often with soap and warm water for at least 20 seconds.  If soap and water are not readily available, use an alcohol-based hand sanitizer with at least 60% alcohol.  . If coughing or sneezing, cover your mouth and nose by coughing or sneezing into the elbow areas of your shirt or coat, into a tissue or into your sleeve (not your hands). Langley Gauss A MASK when in public  . Avoid shaking hands with others and consider head nods or verbal greetings only. . Avoid touching your eyes, nose, or mouth with unwashed hands.  . Avoid close contact with people who are sick. . Avoid places or events with large numbers of people in one location, like concerts or sporting events. . If you have some symptoms but not  all symptoms, continue to monitor at home and seek medical attention if your symptoms worsen. . If you are having a medical emergency, call 911.   [place] / e-Visit: [email or web address]         [facility]: [facility]: [phone]                   [facility]: [phone]     It is flu season:   >>> Best ways to protect herself from the flu: Receive the yearly flu vaccine, practice good hand hygiene washing with soap and also using hand sanitizer when available, eat a nutritious  meals, get adequate rest, hydrate appropriately   Please contact the office if your symptoms worsen or you have concerns that you are not improving.   Thank you for choosing [facility] for your healthcare, and for allowing [place] to partner with you on your healthcare journey. I am thankful to be able to provide care to you today.   Wyn Quaker FNP-C

## 2020-01-13 DIAGNOSIS — J449 Chronic obstructive pulmonary disease, unspecified: Secondary | ICD-10-CM | POA: Diagnosis not present

## 2020-01-20 ENCOUNTER — Telehealth: Payer: Self-pay | Admitting: Pulmonary Disease

## 2020-01-20 ENCOUNTER — Other Ambulatory Visit: Payer: Self-pay | Admitting: Internal Medicine

## 2020-01-20 MED ORDER — TRELEGY ELLIPTA 100-62.5-25 MCG/INH IN AEPB: 1.0000 | INHALATION_SPRAY | Freq: Every day | RESPIRATORY_TRACT | 5 refills | Status: DC

## 2020-01-20 NOTE — Telephone Encounter (Signed)
Spoke with pt and advised rx for trelegy sent to [facility] [facility]. Nothing further is needed.

## 2020-01-20 NOTE — Telephone Encounter (Signed)
Please refill as per office routine med refill policy (all routine meds refilled for 3 mo or monthly per pt preference up to one year from last visit, then month to month grace period for 3 mo, then further med refills will have to be denied)  

## 2020-01-25 ENCOUNTER — Telehealth: Payer: Self-pay

## 2020-01-25 MED ORDER — METFORMIN HCL ER 500 MG PO TB24: 1000.0000 mg | ORAL_TABLET | Freq: Every day | ORAL | 3 refills | Status: DC

## 2020-01-25 NOTE — Telephone Encounter (Signed)
New message    The patient calls asking the nurse to call her back to discuss blood sugar reading   Blood sugar reading today  186 after taken medication   Blood sugar reading yesterday  181

## 2020-01-25 NOTE — Telephone Encounter (Signed)
Called pt concerning msg below.pt states her BS has been running high. The lowest reading she's been getting 149. She states she is taking the metformin every am. Pls advise.Marland KitchenJohny Chess

## 2020-01-25 NOTE — Telephone Encounter (Signed)
Ok to increase the metformin to 2 in the am - I will do rx

## 2020-01-25 NOTE — Telephone Encounter (Signed)
Called pt there was no answer LMOM w/MD response../lmb 

## 2020-01-26 ENCOUNTER — Other Ambulatory Visit: Payer: Self-pay

## 2020-01-26 ENCOUNTER — Telehealth: Payer: Self-pay | Admitting: Pulmonary Disease

## 2020-01-26 ENCOUNTER — Emergency Department (HOSPITAL_COMMUNITY)
Admission: EM | Admit: 2020-01-26 | Discharge: 2020-01-26 | Disposition: A | Discharge status: Home / Self Care | Payer: BC Managed Care – PPO | Attending: Emergency Medicine | Admitting: Emergency Medicine

## 2020-01-26 ENCOUNTER — Emergency Department (HOSPITAL_COMMUNITY): Payer: BC Managed Care – PPO

## 2020-01-26 ENCOUNTER — Encounter (HOSPITAL_COMMUNITY): Payer: Self-pay | Admitting: Emergency Medicine

## 2020-01-26 DIAGNOSIS — I110 Hypertensive heart disease with heart failure: Secondary | ICD-10-CM | POA: Insufficient documentation

## 2020-01-26 DIAGNOSIS — Z79899 Other long term (current) drug therapy: Secondary | ICD-10-CM | POA: Insufficient documentation

## 2020-01-26 DIAGNOSIS — Z951 Presence of aortocoronary bypass graft: Secondary | ICD-10-CM | POA: Insufficient documentation

## 2020-01-26 DIAGNOSIS — I509 Heart failure, unspecified: Secondary | ICD-10-CM | POA: Diagnosis not present

## 2020-01-26 DIAGNOSIS — J441 Chronic obstructive pulmonary disease with (acute) exacerbation: Secondary | ICD-10-CM | POA: Diagnosis not present

## 2020-01-26 DIAGNOSIS — Z20822 Contact with and (suspected) exposure to covid-19: Secondary | ICD-10-CM | POA: Insufficient documentation

## 2020-01-26 DIAGNOSIS — I2510 Atherosclerotic heart disease of native coronary artery without angina pectoris: Secondary | ICD-10-CM | POA: Insufficient documentation

## 2020-01-26 DIAGNOSIS — Z87891 Personal history of nicotine dependence: Secondary | ICD-10-CM | POA: Diagnosis not present

## 2020-01-26 DIAGNOSIS — R05 Cough: Secondary | ICD-10-CM | POA: Insufficient documentation

## 2020-01-26 DIAGNOSIS — R0602 Shortness of breath: Secondary | ICD-10-CM | POA: Diagnosis not present

## 2020-01-26 HISTORY — DX: Type 2 diabetes mellitus without complications: E11.9

## 2020-01-26 LAB — POCT I-STAT EG7
Acid-Base Excess: 17 mmol/L — ABNORMAL HIGH (ref 0.0–2.0)
Bicarbonate: 46.1 mmol/L — ABNORMAL HIGH (ref 20.0–28.0)
Calcium, Ion: 1.11 mmol/L — ABNORMAL LOW (ref 1.15–1.40)
HCT: 44 % (ref 36.0–46.0)
Hemoglobin: 15 g/dL (ref 12.0–15.0)
O2 Saturation: 86 %
Potassium: 4.1 mmol/L (ref 3.5–5.1)
Sodium: 141 mmol/L (ref 135–145)
TCO2: 48 mmol/L — ABNORMAL HIGH (ref 22–32)
pCO2, Ven: 74.3 mmHg (ref 44.0–60.0)
pH, Ven: 7.4 (ref 7.250–7.430)
pO2, Ven: 55 mmHg — ABNORMAL HIGH (ref 32.0–45.0)

## 2020-01-26 LAB — CBC WITH DIFFERENTIAL/PLATELET
Abs Immature Granulocytes: 0.03 10*3/uL (ref 0.00–0.07)
Basophils Absolute: 0 10*3/uL (ref 0.0–0.1)
Basophils Relative: 0 %
Eosinophils Absolute: 0.1 10*3/uL (ref 0.0–0.5)
Eosinophils Relative: 1 %
HCT: 45.9 % (ref 36.0–46.0)
Hemoglobin: 13.9 g/dL (ref 12.0–15.0)
Immature Granulocytes: 0 %
Lymphocytes Relative: 12 %
Lymphs Abs: 1.1 10*3/uL (ref 0.7–4.0)
MCH: 28.7 pg (ref 26.0–34.0)
MCHC: 30.3 g/dL (ref 30.0–36.0)
MCV: 94.8 fL (ref 80.0–100.0)
Monocytes Absolute: 0.5 10*3/uL (ref 0.1–1.0)
Monocytes Relative: 5 %
Neutro Abs: 7.5 10*3/uL (ref 1.7–7.7)
Neutrophils Relative %: 82 %
Platelets: 178 10*3/uL (ref 150–400)
RBC: 4.84 MIL/uL (ref 3.87–5.11)
RDW: 12.4 % (ref 11.5–15.5)
WBC: 9.2 10*3/uL (ref 4.0–10.5)
nRBC: 0 % (ref 0.0–0.2)

## 2020-01-26 LAB — COMPREHENSIVE METABOLIC PANEL
ALT: 33 U/L (ref 0–44)
AST: 23 U/L (ref 15–41)
Albumin: 4.2 g/dL (ref 3.5–5.0)
Alkaline Phosphatase: 77 U/L (ref 38–126)
Anion gap: 12 (ref 5–15)
BUN: 12 mg/dL (ref 8–23)
CO2: 39 mmol/L — ABNORMAL HIGH (ref 22–32)
Calcium: 9.1 mg/dL (ref 8.9–10.3)
Chloride: 92 mmol/L — ABNORMAL LOW (ref 98–111)
Creatinine, Ser: 0.4 mg/dL — ABNORMAL LOW (ref 0.44–1.00)
GFR calc Af Amer: >60 mL/min (ref >60)
GFR calc non Af Amer: >60 mL/min (ref >60)
Glucose, Bld: 158 mg/dL — ABNORMAL HIGH (ref 70–99)
Potassium: 4.1 mmol/L (ref 3.5–5.1)
Sodium: 143 mmol/L (ref 135–145)
Total Bilirubin: 0.5 mg/dL (ref 0.3–1.2)
Total Protein: 7.4 g/dL (ref 6.5–8.1)

## 2020-01-26 LAB — RESPIRATORY PANEL BY RT PCR (FLU A&B, COVID)
Influenza A by PCR: NEGATIVE
Influenza B by PCR: NEGATIVE
SARS Coronavirus 2 by RT PCR: NEGATIVE

## 2020-01-26 LAB — BRAIN NATRIURETIC PEPTIDE: B Natriuretic Peptide: 100.5 pg/mL — ABNORMAL HIGH (ref 0.0–100.0)

## 2020-01-26 MED ORDER — ALBUTEROL SULFATE HFA 108 (90 BASE) MCG/ACT IN AERS
6.0000 | INHALATION_SPRAY | Freq: Once | RESPIRATORY_TRACT | Status: AC
  Administered 2020-01-26: 6 via RESPIRATORY_TRACT
  Filled 2020-01-26: qty 6.7

## 2020-01-26 MED ORDER — MAGNESIUM SULFATE 2 GM/50ML IV SOLN
2.0000 g | Freq: Once | INTRAVENOUS | Status: AC
  Administered 2020-01-26: 2 g via INTRAVENOUS
  Filled 2020-01-26: qty 50

## 2020-01-26 MED ORDER — PREDNISONE 20 MG PO TABS: 20.0000 mg | ORAL_TABLET | Freq: Two times a day (BID) | ORAL | 0 refills | Status: DC

## 2020-01-26 MED ORDER — METHYLPREDNISOLONE SODIUM SUCC 125 MG IJ SOLR
125.0000 mg | Freq: Once | INTRAMUSCULAR | Status: AC
  Administered 2020-01-26: 125 mg via INTRAVENOUS
  Filled 2020-01-26: qty 2

## 2020-01-26 NOTE — ED Notes (Signed)
Pt ambulated in hallway w/ RN. Pt denied SOB, lightheadedness or dizziness.

## 2020-01-26 NOTE — Telephone Encounter (Signed)
Our recommendations would be that she present for an in person evaluation either at the urgent care or an emergency room.   Patient should have follow-up with our office after completing emergent care evaluation.Wyn Quaker, FNP

## 2020-01-26 NOTE — ED Notes (Signed)
Pt verbalized understanding of discharge instructions. Follow up care, and prescriptions were reviewed. Pt did not have any further questions.

## 2020-01-26 NOTE — Discharge Instructions (Addendum)
Continue use your current treatments at home.  Start the prednisone prescription tomorrow.  See your doctor for checkup in a week or 2, and as needed.

## 2020-01-26 NOTE — ED Notes (Signed)
RN decreased O2 to 3L via [place]. Pt's SpO2 sat remains 98-100%.

## 2020-01-26 NOTE — Telephone Encounter (Signed)
Spoke with patient's sister again. She is aware of Brian's recommendations. She wanted to know if the patient could come to our office today. I advised her that we did not have any openings and a UC or ED would be better equipped, especially with the severe increase in SOB. She verbalized understanding.   Nothing further needed at time of call.

## 2020-01-26 NOTE — ED Triage Notes (Signed)
Pt here from home for increasing sob since last night. Pt has COPD, wears 3L [place] at baseline but increased to 5L. Pt states she gets SOB with any movement and when laying flat, also reports feeling congested in her chest and coughing up clear mucus. Pt received covid vaccine, recent dx of DM. AOx4, VSS.

## 2020-01-26 NOTE — Telephone Encounter (Signed)
Recommendations are difficult to provide without knowing her vital signs.  If she is having severe persistent shortness of breath despite all of her maintenance medications.  Then likely an emergency room or urgent care eval would be appropriate.    How low is her oxygen sats?  Did she have the ability to check this?  What is her heart rate?  Wyn Quaker, FNP

## 2020-01-26 NOTE — Telephone Encounter (Signed)
Spoke with patient's sister. She stated that the patient woke up this morning with severe shortness of breath around 2am. She uses between 2-4L of O2 and had her O2 on when she woke up. Her sister called the paramedics who came out to check on her. They advised her to go to the ED or call her pulmonologist. She denied any coughing, fevers or body aches. She is still using prednisone 5mg , Trelegy and duoneb as needed.   I advised her sister if the paramedics advised her to go to the ED, she needs to go to the ED. Per sister, she wants to know Brian's recommendations.   Aaron Edelman, please advise. Thanks!

## 2020-01-26 NOTE — ED Provider Notes (Signed)
[facility] EMERGENCY DEPARTMENT Provider Note   CSN: [id number] Arrival date & time: 01/26/20  1302     History Chief Complaint  Patient presents with  . Shortness of Breath    [name] is a 64 y.o. female.  HPI She presents for worsening shortness of breath, not amenable to increasing oxygen from 4 L to 5 L, at home.  Transferred by EMS, without additional treatment.  She began feeling ill yesterday after doing more than usual.  Initially she got her second COVID-19 vaccine yesterday, then went to visit her friend in the hospital.  She has cough productive of clear sputum.  She denies fever, chills, chest pain, abdominal pain, back pain, change in bowel or urinary habits.  No other recent illnesses.  She uses home nebulizer, sporadically, and tried it a couple times during the night, but not this morning.  No known sick contacts.  There are no other known modifying factors.    Past Medical History:  Diagnosis Date  . Bronchitis   . CAD (coronary artery disease)    a. 02/2002: CABG x3 with LIMA to LAD, RIMA to RCA, and SVG to OM  . CHF (congestive heart failure) ([place])   . COPD (chronic obstructive pulmonary disease) ([place])   . Diabetes ([place])   . Headache   . Heart disease   . Hyperlipidemia   . Hypertension   . Impaired glucose tolerance 08/20/2014  . Sinusitis     Patient Active Problem List   Diagnosis Date Noted  . Vitamin D deficiency 01/03/2020  . HLD (hyperlipidemia) 01/03/2020  . Acute pancreatitis 09/09/2019  . Epigastric pain 09/06/2019  . Back pain 09/06/2019  . Eustachian tube dysfunction 05/18/2019  . Insomnia 05/18/2019  . Chronic respiratory failure with hypoxia ([place]) 12/14/2018  . COPD exacerbation ([place]) 09/03/2017  . Former smoker 03/07/2017  . Sinusitis, acute 04/08/2016  . Left lumbar radiculopathy 10/19/2015  . Cough 08/29/2015  . Pedal edema 02/22/2015  . Diabetes ([place]) 08/20/2014  . Pruritus 07/08/2014  . Nocturnal  hypoxemia 07/05/2014  . Esophagus, foreign body 06/12/2014  . Polycythemia, secondary 02/16/2014  . 5 mm Lung nodule, solitary 02/02/2014  . GERD (gastroesophageal reflux disease) 02/01/2014  . Hypersomnolence 02/01/2014  . Allergic rhinitis 09/03/2013  . CAD (coronary artery disease) 09/03/2013  . Encounter for well adult exam with abnormal findings 09/03/2013  . Essential hypertension 07/07/2013  . COPD (chronic obstructive pulmonary disease) with chronic bronchitis ([place]) 07/07/2013  . Status post coronary artery bypass grafting 07/07/2013  . Headache 07/01/2013  . Hypertensive emergency 07/01/2013  . Polycythemia 07/01/2013    Past Surgical History:  Procedure Laterality Date  . CORONARY ARTERY BYPASS GRAFT  2003   Triple bypass  . ESOPHAGOGASTRODUODENOSCOPY Left 06/13/2014   Procedure: ESOPHAGOGASTRODUODENOSCOPY (EGD);  Surgeon: Juanita Craver, MD;  Location: [facility] ENDOSCOPY;  Service: Endoscopy;  Laterality: Left;  . RIGID ESOPHAGOSCOPY N/A 06/12/2014   Procedure: RIGID ESOPHAGOSCOPY WITH FOREIGN BODY REMOVAL;  Surgeon: Ascencion Dike, MD;  Location: [facility];  Service: ENT;  Laterality: N/A;     OB History   No obstetric history on file.     Family History  Problem Relation Age of Onset  . Lung cancer Paternal Uncle   . Heart disease Mother   . Brain cancer Mother   . Cancer Maternal Grandmother        colon  . Stroke Other   . Cancer Other        x 4  aunts  . Lung cancer Maternal Aunt   . Suicidality Brother     Social History   Tobacco Use  . Smoking status: Former Smoker    Packs/day: 0.50    Years: 47.00    Pack years: 23.50    Types: Cigarettes    Start date: 12/29/1971    Quit date: 05/22/2019    Years since quitting: 0.6  . Smokeless tobacco: Never Used  Substance Use Topics  . Alcohol use: Yes    Alcohol/week: 5.0 - 6.0 standard drinks    Types: 5 - 6 Glasses of wine per week  . Drug use: Yes    Types: Marijuana    Comment: as a teenager    Home  Medications Prior to Admission medications   Medication Sig Start Date End Date Taking? Authorizing Provider  albuterol (PROVENTIL HFA) 108 (90 Base) MCG/ACT inhaler INHALE 1-2 PUFFS INTO THE LUNGS EVERY 6 (SIX) HOURS AS NEEDED FOR WHEEZING OR SHORTNESS OF BREATH. 11/27/19   Biagio Borg, MD  amLODipine (NORVASC) 5 MG tablet TAKE 1 TABLET BY MOUTH EVERY DAY 12/14/19   Jerline Pain, MD  atorvastatin (LIPITOR) 40 MG tablet Take 1 tablet (40 mg total) by mouth daily. 01/03/20   Biagio Borg, MD  carvedilol (COREG) 12.5 MG tablet TAKE 1 TABLET (12.5 MG TOTAL) BY MOUTH 2 (TWO) TIMES DAILY. Patient taking differently: Take 12.5 mg by mouth 2 (two) times daily.  06/09/19   Jerline Pain, MD  cholecalciferol (VITAMIN D) 1000 units tablet Take 1,000 Units by mouth daily.    [provider]  diltiazem (CARDIZEM) 30 MG tablet Take 1 tablet (30 mg total) by mouth every 6 (six) hours as needed. 03/02/19   Jerline Pain, MD  fexofenadine (ALLEGRA) 180 MG tablet Take 1 tablet (180 mg total) by mouth daily. 07/14/19   Biagio Borg, MD  Fluticasone-Umeclidin-Vilant (TRELEGY ELLIPTA) 100-62.5-25 MCG/INH AEPB Inhale 1 puff into the lungs daily. 01/20/20   Rigoberto Noel, MD  furosemide (LASIX) 40 MG tablet TAKE 1 TABLET BY MOUTH TWICE A DAY 07/23/19   Biagio Borg, MD  ipratropium-albuterol (DUONEB) 0.5-2.5 (3) MG/3ML SOLN TAKE 3 MLS BY NEBULIZATION EVERY 6 (SIX) HOURS AS NEEDED. 07/23/19   Biagio Borg, MD  isosorbide mononitrate (IMDUR) 60 MG 24 hr tablet TAKE 1 & 1/2 TABLETS BY MOUTH DAILY Patient taking differently: Take 90 mg by mouth daily.  06/09/19   Jerline Pain, MD  KLOR-CON M10 10 MEQ tablet TAKE 1 TABLET BY MOUTH EVERY DAY 11/15/19   Biagio Borg, MD  metFORMIN (GLUCOPHAGE-XR) 500 MG 24 hr tablet Take 2 tablets (1,000 mg total) by mouth daily with breakfast. 01/25/20   Biagio Borg, MD  predniSONE (DELTASONE) 20 MG tablet Take 1 tablet (20 mg total) by mouth 2 (two) times daily. 01/26/20   Daleen Bo, MD  triamcinolone (NASACORT) 55 MCG/ACT AERO nasal inhaler Place 2 sprays into the nose daily. 07/14/19   Biagio Borg, MD  vitamin B-12 (CYANOCOBALAMIN) 1000 MCG tablet Take 1,000 mcg by mouth daily.    [provider]  vitamin C (ASCORBIC ACID) 500 MG tablet Take 1,000 mg by mouth daily.    [provider]    Allergies    Augmentin [amoxicillin-pot clavulanate]  Review of Systems   Review of Systems  All other systems reviewed and are negative.   Physical Exam Updated Vital Signs BP (!) 128/55   Pulse 83  Temp 98.8 F (37.1 C) (Oral)   Resp (!) 31   SpO2 100%   Physical Exam Vitals and nursing note reviewed.  Constitutional:      Appearance: She is well-developed.  HENT:     Head: Normocephalic and atraumatic.     Right Ear: External ear normal.     Left Ear: External ear normal.  Eyes:     Conjunctiva/sclera: Conjunctivae normal.     Pupils: Pupils are equal, round, and reactive to light.  Neck:     Trachea: Phonation normal.  Cardiovascular:     Rate and Rhythm: Normal rate and regular rhythm.     Heart sounds: Normal heart sounds.  Pulmonary:     Effort: Pulmonary effort is normal. No respiratory distress.     Breath sounds: No stridor. Wheezing present. No rhonchi.  Abdominal:     Palpations: Abdomen is soft.     Tenderness: There is no abdominal tenderness.  Musculoskeletal:        General: Normal range of motion.     Cervical back: Normal range of motion and neck supple.     Right lower leg: No edema.     Left lower leg: No edema.  Skin:    General: Skin is warm and dry.  Neurological:     Mental Status: She is alert and oriented to person, place, and time.     Cranial Nerves: No cranial nerve deficit.     Sensory: No sensory deficit.     Motor: No abnormal muscle tone.     Coordination: Coordination normal.  Psychiatric:        Mood and Affect: Mood normal.        Behavior: Behavior normal.        Thought Content:  Thought content normal.        Judgment: Judgment normal.     ED Results / Procedures / Treatments   Labs (all labs ordered are listed, but only abnormal results are displayed) Labs Reviewed  COMPREHENSIVE METABOLIC PANEL - Abnormal; Notable for the following components:      Result Value   Chloride 92 (*)    CO2 39 (*)    Glucose, Bld 158 (*)    Creatinine, Ser 0.40 (*)    All other components within normal limits  BRAIN NATRIURETIC PEPTIDE - Abnormal; Notable for the following components:   B Natriuretic Peptide 100.5 (*)    All other components within normal limits  POCT I-STAT EG7 - Abnormal; Notable for the following components:   pCO2, Ven 74.3 (*)    pO2, Ven 55.0 (*)    Bicarbonate 46.1 (*)    TCO2 48 (*)    Acid-Base Excess 17.0 (*)    Calcium, Ion 1.11 (*)    All other components within normal limits  RESPIRATORY PANEL BY RT PCR (FLU A&B, COVID)  CBC WITH DIFFERENTIAL/PLATELET  BLOOD GAS, VENOUS    EKG EKG Interpretation  Date/Time:  Wednesday January 26 2020 13:10:17 EDT Ventricular Rate:  73 PR Interval:    QRS Duration: 83 QT Interval:  410 QTC Calculation: 452 R Axis:   70 Text Interpretation: Sinus rhythm Nonspecific T abnrm, anterolateral leads since last tracing no significant change Confirmed by Daleen Bo [phone]) on 01/26/2020 2:44:33 PM   Radiology DG Chest Port 1 View  Result Date: 01/26/2020 CLINICAL DATA:  Shortness of breath EXAM: PORTABLE CHEST 1 VIEW COMPARISON:  09/02/2019 FINDINGS: Post CABG changes. The heart size and mediastinal contours are within  normal limits. Chronic interstitial prominence, similar to prior. No focal airspace consolidation, pleural effusion, or pneumothorax. The visualized skeletal structures are unremarkable. IMPRESSION: Persistent prominence of the pulmonary interstitium which could reflect chronic bronchitic-type lung changes or a component of interstitial edema. Findings are similar to prior studies. Electronically  Signed   By: Davina Poke D.O.   On: 01/26/2020 14:24    Procedures .Critical Care Performed by: Daleen Bo, MD Authorized by: Daleen Bo, MD   Critical care provider statement:    Critical care time (minutes):  35   Critical care start time:  01/26/2020 1:50 PM   Critical care end time:  01/26/2020 5:25 PM   Critical care time was exclusive of:  Separately billable procedures and treating other patients   Critical care was necessary to treat or prevent imminent or life-threatening deterioration of the following conditions:  Respiratory failure   Critical care was time spent personally by me on the following activities:  Blood draw for specimens, development of treatment plan with patient or surrogate, discussions with consultants, evaluation of patient's response to treatment, examination of patient, obtaining history from patient or surrogate, ordering and performing treatments and interventions, ordering and review of laboratory studies, pulse oximetry, re-evaluation of patient's condition, review of old charts and ordering and review of radiographic studies   (including critical care time)  Medications Ordered in ED Medications  methylPREDNISolone sodium succinate (SOLU-MEDROL) 125 mg/2 mL injection 125 mg (125 mg Intravenous Given 01/26/20 1430)  magnesium sulfate IVPB 2 g 50 mL (0 g Intravenous Stopped 01/26/20 1541)  albuterol (VENTOLIN HFA) 108 (90 Base) MCG/ACT inhaler 6 puff (6 puffs Inhalation Given 01/26/20 1541)    ED Course  I have reviewed the triage vital signs and the nursing notes.  Pertinent labs & imaging results that were available during my care of the patient were reviewed by me and considered in my medical decision making (see chart for details).    MDM Rules/Calculators/A&P                       Patient Vitals for the past 24 hrs:  BP Temp Temp src Pulse Resp SpO2  01/26/20 1630 (!) 128/55 -- -- 83 (!) 31 100 %  01/26/20 1615 (!) 113/51 -- -- 73 (!) 22  100 %  01/26/20 1600 (!) 119/43 -- -- 68 (!) 26 100 %  01/26/20 1545 (!) 131/58 -- -- 67 (!) 26 100 %  01/26/20 1530 (!) 123/59 -- -- 67 (!) 22 100 %  01/26/20 1500 (!) 130/58 -- -- 78 (!) 27 100 %  01/26/20 1445 (!) 122/57 -- -- 66 (!) 29 100 %  01/26/20 1415 125/66 -- -- -- (!) 27 --  01/26/20 1400 140/62 -- -- -- (!) 22 --  01/26/20 1345 (!) 139/53 -- -- 71 (!) 28 100 %  01/26/20 1330 (!) 134/58 -- -- 64 (!) 26 100 %  01/26/20 1315 111/69 -- -- 77 (!) 22 100 %  01/26/20 1313 (!) 132/57 98.8 F (37.1 C) Oral 89 (!) 30 100 %  01/26/20 1310 -- -- -- -- -- 98 %    5:31 PM Reevaluation with update and discussion. After initial assessment and treatment, an updated evaluation reveals she feels better and feels like she can manage herself at home.  Ambulation trial completed before discharge. Daleen Bo   Medical Decision Making:  This patient is presenting for evaluation of difficulty breathing, which will require a range of treatment  options, and is a complaint that involves a high risk of morbidity and mortality. The differential diagnoses include pneumonia, COPD exacerbation, sepsis, ACS. I decided  to review old records, and in summary patient with chronic hypoxia requiring at home use of oxygen at 3 to 4 L/min.  No recent exacerbation of COPD.Clinical Laboratory Tests Ordered, included CBC, c-Met.  Results reviewed by me Radiologic Tests Ordered, included chest x-ray Reviewed indicating bronchitis. I independently Visualized: Radiology images, which show bronchitis;   Critical Interventions-IV Solu-Medrol and magnesium.  Inhaled albuterol  After These Interventions, the Patient was reevaluated and was found she was improved with stable oxygenation at 3 L/min, by nasal cannula; 100% pulse ox.  No pneumonia found on imaging or clinical evaluation.  Patient improved and stable for discharge.  CRITICAL CARE-yes Performed by: Daleen Bo   Nursing Notes Reviewed/ Care  Coordinated Applicable Imaging Reviewed Interpretation of Laboratory Data incorporated into ED treatment  The patient appears reasonably screened and/or stabilized for discharge and I doubt any other medical condition or other [facility] requiring further screening, evaluation, or treatment in the ED at this time prior to discharge.  Plan: Home Medications-continue usual; Home Treatments-continue oxygen treatment; return here if the recommended treatment, does not improve the symptoms; Recommended follow up-[facility] needed    Final Clinical Impression(s) / ED Diagnoses Final diagnoses:  COPD exacerbation ([place])    Rx / DC Orders ED Discharge Orders         Ordered    predniSONE (DELTASONE) 20 MG tablet  2 times daily     01/26/20 1720           Daleen Bo, MD 01/26/20 1732

## 2020-01-26 NOTE — Telephone Encounter (Signed)
Spoke with [name]. She stated that the paramedics advised her that her HR was "good" despite not being told a specific number. Her O2 was 97% on 5L. Her blood sugar was 148. BP was 149/78. At the time of call, she stated that she was still having the episodes of SOB despite being on 5L.

## 2020-01-27 DIAGNOSIS — J449 Chronic obstructive pulmonary disease, unspecified: Secondary | ICD-10-CM | POA: Diagnosis not present

## 2020-01-28 ENCOUNTER — Emergency Department (HOSPITAL_COMMUNITY): Payer: BC Managed Care – PPO

## 2020-01-28 ENCOUNTER — Other Ambulatory Visit: Payer: Self-pay

## 2020-01-28 ENCOUNTER — Emergency Department (HOSPITAL_COMMUNITY)
Admission: EM | Admit: 2020-01-28 | Discharge: 2020-01-28 | Disposition: A | Discharge status: Home / Self Care | Payer: BC Managed Care – PPO | Attending: Emergency Medicine | Admitting: Emergency Medicine

## 2020-01-28 DIAGNOSIS — J441 Chronic obstructive pulmonary disease with (acute) exacerbation: Secondary | ICD-10-CM | POA: Insufficient documentation

## 2020-01-28 DIAGNOSIS — I2510 Atherosclerotic heart disease of native coronary artery without angina pectoris: Secondary | ICD-10-CM | POA: Diagnosis not present

## 2020-01-28 DIAGNOSIS — Z7984 Long term (current) use of oral hypoglycemic drugs: Secondary | ICD-10-CM | POA: Insufficient documentation

## 2020-01-28 DIAGNOSIS — I509 Heart failure, unspecified: Secondary | ICD-10-CM | POA: Insufficient documentation

## 2020-01-28 DIAGNOSIS — E119 Type 2 diabetes mellitus without complications: Secondary | ICD-10-CM | POA: Insufficient documentation

## 2020-01-28 DIAGNOSIS — Z951 Presence of aortocoronary bypass graft: Secondary | ICD-10-CM | POA: Insufficient documentation

## 2020-01-28 DIAGNOSIS — Z87891 Personal history of nicotine dependence: Secondary | ICD-10-CM | POA: Insufficient documentation

## 2020-01-28 DIAGNOSIS — R0602 Shortness of breath: Secondary | ICD-10-CM | POA: Diagnosis not present

## 2020-01-28 DIAGNOSIS — Z79899 Other long term (current) drug therapy: Secondary | ICD-10-CM | POA: Insufficient documentation

## 2020-01-28 DIAGNOSIS — R069 Unspecified abnormalities of breathing: Secondary | ICD-10-CM | POA: Diagnosis not present

## 2020-01-28 DIAGNOSIS — I110 Hypertensive heart disease with heart failure: Secondary | ICD-10-CM | POA: Insufficient documentation

## 2020-01-28 LAB — BASIC METABOLIC PANEL
Anion gap: 10 (ref 5–15)
BUN: 26 mg/dL — ABNORMAL HIGH (ref 8–23)
CO2: 39 mmol/L — ABNORMAL HIGH (ref 22–32)
Calcium: 9.5 mg/dL (ref 8.9–10.3)
Chloride: 91 mmol/L — ABNORMAL LOW (ref 98–111)
Creatinine, Ser: 0.51 mg/dL (ref 0.44–1.00)
Glucose, Bld: 155 mg/dL — ABNORMAL HIGH (ref 70–99)
Potassium: 3.9 mmol/L (ref 3.5–5.1)
Sodium: 140 mmol/L (ref 135–145)

## 2020-01-28 LAB — CBC
HCT: 46.3 % — ABNORMAL HIGH (ref 36.0–46.0)
Hemoglobin: 14.2 g/dL (ref 12.0–15.0)
MCH: 29 pg (ref 26.0–34.0)
MCHC: 30.7 g/dL (ref 30.0–36.0)
MCV: 94.5 fL (ref 80.0–100.0)
Platelets: 202 10*3/uL (ref 150–400)
RBC: 4.9 MIL/uL (ref 3.87–5.11)
RDW: 12.6 % (ref 11.5–15.5)
WBC: 14.7 10*3/uL — ABNORMAL HIGH (ref 4.0–10.5)
nRBC: 0 % (ref 0.0–0.2)

## 2020-01-28 LAB — BRAIN NATRIURETIC PEPTIDE: B Natriuretic Peptide: 61.4 pg/mL (ref 0.0–100.0)

## 2020-01-28 MED ORDER — ALBUTEROL SULFATE HFA 108 (90 BASE) MCG/ACT IN AERS
4.0000 | INHALATION_SPRAY | Freq: Once | RESPIRATORY_TRACT | Status: AC
  Administered 2020-01-28: 4 via RESPIRATORY_TRACT
  Filled 2020-01-28: qty 6.7

## 2020-01-28 MED ORDER — IOHEXOL 350 MG/ML SOLN
100.0000 mL | Freq: Once | INTRAVENOUS | Status: AC | PRN
  Administered 2020-01-28: 100 mL via INTRAVENOUS

## 2020-01-28 MED ORDER — METHYLPREDNISOLONE SODIUM SUCC 125 MG IJ SOLR
125.0000 mg | Freq: Once | INTRAMUSCULAR | Status: AC
  Administered 2020-01-28: 08:00:00 125 mg via INTRAVENOUS
  Filled 2020-01-28: qty 2

## 2020-01-28 NOTE — ED Triage Notes (Addendum)
Pt c/o SOB. Seen here on 01/26/20 for same. EMS had pt use her personal pro-air. 18 ga in [place], 125 solu-medrol given.

## 2020-01-28 NOTE — ED Notes (Signed)
Pt ambulated with pulse oxymetry on 3 Liters of oxygen. Pt stated feel mild short of breath after walking 60 ft. Her saturation stayed from 97-99% the whole time. But she felt more short of breath than usual, 6 out of 10 and respiratory rate of 26

## 2020-01-28 NOTE — ED Provider Notes (Signed)
Medical screening examination/treatment/procedure(s) were conducted as a shared visit with non-physician practitioner(s) and myself.  I personally evaluated the patient during the encounter.  EKG Interpretation  Date/Time:  Friday January 28 2020 01:41:41 EDT Ventricular Rate:  75 PR Interval:  154 QRS Duration: 72 QT Interval:  376 QTC Calculation: 419 R Axis:   83 Text Interpretation: Normal sinus rhythm Normal ECG Confirmed by Lacretia Leigh ([id number]) on 01/28/2020 10:57:57 AM  64 year old female with history of COPD on chronic 3 L of oxygen presents with increasing dyspnea time several days.  Chest x-ray right here without acute infiltrate.  Labs reviewed.  Will order chest CT to rule a PE    Lacretia Leigh, MD 01/28/20 1046

## 2020-01-28 NOTE — Discharge Instructions (Addendum)
You have been seen today for shortness of breath. Please read and follow all provided instructions. Return to the emergency room for worsening condition or new concerning symptoms.    The CT scan of your chest did not show any blood clots in your lungs.  1. Medications:  Continue usual home medications.  Take medications as prescribed. Please review all of the medicines and only take them if you do not have an allergy to them.   2. Treatment: rest  3. Follow Up:   Please follow-up with your lung doctor as soon as possible for further evaluation.   ?

## 2020-01-28 NOTE — ED Notes (Signed)
Pt from waiting room. Pt changed and in bed. Pt while in waiting room on 4 liters. Pt SpO2 100%. Pt denies SOB at this time. Titrated O2 down to 3liters as this is her home baseline. Pt tolerating well and SpO2 remains 100%

## 2020-01-28 NOTE — ED Provider Notes (Signed)
[facility] EMERGENCY DEPARTMENT Provider Note   CSN: [id number] Arrival date & time: 01/28/20  0142     History Chief Complaint  Patient presents with  . Shortness of Breath    [name] is a 64 y.o. female with past medical history significant for CHF, COPD, diabetes, hypertension, hyperlipidemia presents to emergency department today via EMS with chief complaint of progressively worsening shortness of breath x1 day.  She states she was at home sitting on the couch when her symptoms started and her symptoms were worse when walking and lying flat.  She wears 3 L nasal cannula at all times with having to increase it to 4 L occasionally.  Patient states she has been taking 10 mg of prednisone daily for x 3 months prescribed by her pulmonologist.  She is also endorsing nonproductive cough.  Patient had second Covid vaccine on 01/25/2020 and states her shortness of breath has been constant ever since then.   She last use albuterol inhaler at 11 PM last night.  She denies any fever, chills, chest pain, abdominal pain, back pain, nausea, vomiting, urinary symptoms, diarrhea, lower extremity edema.  No known sick contacts. She was seen in the emergency department x2 days ago with similar complaints.  She was treated with IV Solu-Medrol and magnesium with symptom improvement.  She was then discharged home with increased prednisone 20 mg daily. She had negative covid test.  History provided by patient with additional history obtained from chart review.     Past Medical History:  Diagnosis Date  . Bronchitis   . CAD (coronary artery disease)    a. 02/2002: CABG x3 with LIMA to LAD, RIMA to RCA, and SVG to OM  . CHF (congestive heart failure) ([place])   . COPD (chronic obstructive pulmonary disease) ([place])   . Diabetes ([place])   . Headache   . Heart disease   . Hyperlipidemia   . Hypertension   . Impaired glucose tolerance 08/20/2014  . Sinusitis     Patient Active Problem  List   Diagnosis Date Noted  . Vitamin D deficiency 01/03/2020  . HLD (hyperlipidemia) 01/03/2020  . Acute pancreatitis 09/09/2019  . Epigastric pain 09/06/2019  . Back pain 09/06/2019  . Eustachian tube dysfunction 05/18/2019  . Insomnia 05/18/2019  . Chronic respiratory failure with hypoxia ([place]) 12/14/2018  . COPD exacerbation ([place]) 09/03/2017  . Former smoker 03/07/2017  . Sinusitis, acute 04/08/2016  . Left lumbar radiculopathy 10/19/2015  . Cough 08/29/2015  . Pedal edema 02/22/2015  . Diabetes ([place]) 08/20/2014  . Pruritus 07/08/2014  . Nocturnal hypoxemia 07/05/2014  . Esophagus, foreign body 06/12/2014  . Polycythemia, secondary 02/16/2014  . 5 mm Lung nodule, solitary 02/02/2014  . GERD (gastroesophageal reflux disease) 02/01/2014  . Hypersomnolence 02/01/2014  . Allergic rhinitis 09/03/2013  . CAD (coronary artery disease) 09/03/2013  . Encounter for well adult exam with abnormal findings 09/03/2013  . Essential hypertension 07/07/2013  . COPD (chronic obstructive pulmonary disease) with chronic bronchitis ([place]) 07/07/2013  . Status post coronary artery bypass grafting 07/07/2013  . Headache 07/01/2013  . Hypertensive emergency 07/01/2013  . Polycythemia 07/01/2013    Past Surgical History:  Procedure Laterality Date  . CORONARY ARTERY BYPASS GRAFT  2003   Triple bypass  . ESOPHAGOGASTRODUODENOSCOPY Left 06/13/2014   Procedure: ESOPHAGOGASTRODUODENOSCOPY (EGD);  Surgeon: Juanita Craver, MD;  Location: [facility] ENDOSCOPY;  Service: Endoscopy;  Laterality: Left;  . RIGID ESOPHAGOSCOPY N/A 06/12/2014   Procedure: RIGID ESOPHAGOSCOPY WITH FOREIGN BODY  REMOVAL;  Surgeon: Ascencion Dike, MD;  Location: [facility];  Service: ENT;  Laterality: N/A;     OB History   No obstetric history on file.     Family History  Problem Relation Age of Onset  . Lung cancer Paternal Uncle   . Heart disease Mother   . Brain cancer Mother   . Cancer Maternal Grandmother        colon  . Stroke  Other   . Cancer Other        x 4 aunts  . Lung cancer Maternal Aunt   . Suicidality Brother     Social History   Tobacco Use  . Smoking status: Former Smoker    Packs/day: 0.50    Years: 47.00    Pack years: 23.50    Types: Cigarettes    Start date: 12/29/1971    Quit date: 05/22/2019    Years since quitting: 0.6  . Smokeless tobacco: Never Used  Substance Use Topics  . Alcohol use: Yes    Alcohol/week: 5.0 - 6.0 standard drinks    Types: 5 - 6 Glasses of wine per week  . Drug use: Yes    Types: Marijuana    Comment: as a teenager    Home Medications Prior to Admission medications   Medication Sig Start Date End Date Taking? Authorizing Provider  albuterol (PROVENTIL HFA) 108 (90 Base) MCG/ACT inhaler INHALE 1-2 PUFFS INTO THE LUNGS EVERY 6 (SIX) HOURS AS NEEDED FOR WHEEZING OR SHORTNESS OF BREATH. Patient taking differently: Inhale 1-2 puffs into the lungs every 6 (six) hours as needed for wheezing or shortness of breath.  11/27/19  Yes Biagio Borg, MD  amLODipine (NORVASC) 5 MG tablet TAKE 1 TABLET BY MOUTH EVERY DAY Patient taking differently: Take 5 mg by mouth daily.  12/14/19  Yes Jerline Pain, MD  atorvastatin (LIPITOR) 40 MG tablet Take 1 tablet (40 mg total) by mouth daily. 01/03/20  Yes Biagio Borg, MD  carvedilol (COREG) 12.5 MG tablet TAKE 1 TABLET (12.5 MG TOTAL) BY MOUTH 2 (TWO) TIMES DAILY. Patient taking differently: Take 12.5 mg by mouth 2 (two) times daily.  06/09/19  Yes Jerline Pain, MD  cholecalciferol (VITAMIN D) 1000 units tablet Take 1,000 Units by mouth daily.   Yes [provider]  fexofenadine (ALLEGRA) 180 MG tablet Take 1 tablet (180 mg total) by mouth daily. 07/14/19  Yes Biagio Borg, MD  Fluticasone-Umeclidin-Vilant (TRELEGY ELLIPTA) 100-62.5-25 MCG/INH AEPB Inhale 1 puff into the lungs daily. 01/20/20  Yes Rigoberto Noel, MD  furosemide (LASIX) 40 MG tablet TAKE 1 TABLET BY MOUTH TWICE A DAY Patient taking differently: Take 40 mg by  mouth 2 (two) times daily.  07/23/19  Yes Biagio Borg, MD  ipratropium-albuterol (DUONEB) 0.5-2.5 (3) MG/3ML SOLN TAKE 3 MLS BY NEBULIZATION EVERY 6 (SIX) HOURS AS NEEDED. Patient taking differently: Take 3 mLs by nebulization every 6 (six) hours as needed (Shortness of breath and wheezing).  07/23/19  Yes Biagio Borg, MD  isosorbide mononitrate (IMDUR) 60 MG 24 hr tablet TAKE 1 & 1/2 TABLETS BY MOUTH DAILY Patient taking differently: Take 90 mg by mouth daily.  06/09/19  Yes Jerline Pain, MD  KLOR-CON M10 10 MEQ tablet TAKE 1 TABLET BY MOUTH EVERY DAY Patient taking differently: Take 10 mEq by mouth daily.  11/15/19  Yes Biagio Borg, MD  metFORMIN (GLUCOPHAGE-XR) 500 MG 24 hr tablet Take 2 tablets (1,000 mg total)  by mouth daily with breakfast. 01/25/20  Yes Biagio Borg, MD  Multiple Vitamin (MULTIVITAMIN WITH MINERALS) TABS tablet Take 1 tablet by mouth daily.   Yes [provider]  triamcinolone (NASACORT) 55 MCG/ACT AERO nasal inhaler Place 2 sprays into the nose daily. Patient taking differently: Place 2 sprays into the nose daily as needed (allergies).  07/14/19  Yes Biagio Borg, MD  vitamin B-12 (CYANOCOBALAMIN) 1000 MCG tablet Take 1,000 mcg by mouth daily.   Yes [provider]  vitamin C (ASCORBIC ACID) 500 MG tablet Take 500 mg by mouth daily.    Yes [provider]  diltiazem (CARDIZEM) 30 MG tablet Take 1 tablet (30 mg total) by mouth every 6 (six) hours as needed. Patient not taking: Reported on 01/28/2020 03/02/19   Jerline Pain, MD  predniSONE (DELTASONE) 20 MG tablet Take 1 tablet (20 mg total) by mouth 2 (two) times daily. Patient taking differently: Take 20 mg by mouth daily.  01/26/20   Daleen Bo, MD    Allergies    Augmentin [amoxicillin-pot clavulanate]  Review of Systems   Review of Systems  All other systems are reviewed and are negative for acute change except as noted in the HPI.   Physical Exam Updated Vital Signs BP (!) 133/59    Pulse 65   Temp 98.1 F (36.7 C) (Oral)   Resp 18   Ht 5\' 1"  (1.549 m)   Wt 81.6 kg   SpO2 97%   BMI 34.01 kg/m   Physical Exam Vitals and nursing note reviewed.  Constitutional:      General: She is not in acute distress.    Appearance: She is not ill-appearing.  HENT:     Head: Normocephalic and atraumatic.     Right Ear: Tympanic membrane and external ear normal.     Left Ear: Tympanic membrane and external ear normal.     Nose: Nose normal.     Mouth/Throat:     Mouth: Mucous membranes are moist.     Pharynx: Oropharynx is clear.  Eyes:     General: No scleral icterus.       Right eye: No discharge.        Left eye: No discharge.     Extraocular Movements: Extraocular movements intact.     Conjunctiva/sclera: Conjunctivae normal.     Pupils: Pupils are equal, round, and reactive to light.  Neck:     Vascular: No JVD.  Cardiovascular:     Rate and Rhythm: Normal rate and regular rhythm.     Pulses: Normal pulses.          Radial pulses are 2+ on the right side and 2+ on the left side.     Heart sounds: Normal heart sounds.  Pulmonary:     Comments: Lungs clear to auscultation in all fields. Symmetric chest rise. No wheezing, rales, or rhonchi.  Oxygen saturation 100% on 3 L.  Mild tachypnea with 23 respirations per minute. Chest:     Chest wall: No tenderness.  Abdominal:     Comments: Abdomen is soft, non-distended, and non-tender in all quadrants. No rigidity, no guarding. No peritoneal signs.  Musculoskeletal:        General: Normal range of motion.     Cervical back: Normal range of motion.     Right lower leg: No edema.     Left lower leg: No edema.  Skin:    General: Skin is warm and dry.  Capillary Refill: Capillary refill takes less than 2 seconds.  Neurological:     Mental Status: She is oriented to person, place, and time.     GCS: GCS eye subscore is 4. GCS verbal subscore is 5. GCS motor subscore is 6.     Comments: Fluent speech, no facial  droop.  Psychiatric:        Behavior: Behavior normal.     ED Results / Procedures / Treatments   Labs (all labs ordered are listed, but only abnormal results are displayed) Labs Reviewed  BASIC METABOLIC PANEL - Abnormal; Notable for the following components:      Result Value   Chloride 91 (*)    CO2 39 (*)    Glucose, Bld 155 (*)    BUN 26 (*)    All other components within normal limits  CBC - Abnormal; Notable for the following components:   WBC 14.7 (*)    HCT 46.3 (*)    All other components within normal limits  BRAIN NATRIURETIC PEPTIDE    EKG EKG Interpretation  Date/Time:  Friday January 28 2020 01:41:41 EDT Ventricular Rate:  75 PR Interval:  154 QRS Duration: 72 QT Interval:  376 QTC Calculation: 419 R Axis:   83 Text Interpretation: Normal sinus rhythm Normal ECG Confirmed by Lacretia Leigh ([id number]) on 01/28/2020 10:46:24 AM   Radiology DG Chest 2 View  Result Date: 01/28/2020 CLINICAL DATA:  Shortness of breath and chest tightness. EXAM: CHEST - 2 VIEW COMPARISON:  January 26, 2020 FINDINGS: Multiple sternal wires and vascular clips are seen. Mild, stable chronic appearing increased lung markings are seen. Mild linear atelectasis is seen within the mid left lung. There is no evidence of a pleural effusion or pneumothorax. The heart size and mediastinal contours are within normal limits. Mild calcification of the aortic arch is noted. Degenerative changes seen throughout the thoracic spine. IMPRESSION: 1. Evidence of prior median sternotomy/CABG. 2. Chronic appearing increased lung markings without evidence of acute or active cardiopulmonary disease. Electronically Signed   By: Virgina Norfolk M.D.   On: 01/28/2020 02:52   CT Angio Chest PE W/Cm &/Or Wo Cm  Result Date: 01/28/2020 CLINICAL DATA:  Shortness of breath EXAM: CT ANGIOGRAPHY CHEST WITH CONTRAST TECHNIQUE: Multidetector CT imaging of the chest was performed using the standard protocol during bolus  administration of intravenous contrast. Multiplanar CT image reconstructions and MIPs were obtained to evaluate the vascular anatomy. CONTRAST:  158mL OMNIPAQUE IOHEXOL 350 MG/ML SOLN COMPARISON:  Radiography same day. CT angiography 12/09/2016. FINDINGS: Cardiovascular: Pulmonary arterial opacification is good. There are no pulmonary emboli. Previous median sternotomy. Heart size is normal. There is aortic atherosclerotic calcification without aneurysm or dissection. Mediastinum/Nodes: No mediastinal or hilar mass or lymphadenopathy. Lungs/Pleura: Early/minimal emphysematous changes in the upper lungs. No sign of infiltrate, collapse or effusion. A few areas of minor pulmonary scarring. Some narrowing of the trachea and mainstem bronchi that could go along with tracheomalacia. Upper Abdomen: Normal Musculoskeletal: Ordinary mild thoracic degenerative changes. Review of the MIP images confirms the above findings. IMPRESSION: 1. No pulmonary emboli or other acute chest pathology. 2. Early/minimal emphysematous changes of the upper lungs. 3. Some narrowing of the trachea and mainstem bronchi that could go along with tracheomalacia. Aortic Atherosclerosis (ICD10-I70.0) and Emphysema (ICD10-J43.9). Electronically Signed   By: Nelson Chimes M.D.   On: 01/28/2020 11:15   DG Chest Port 1 View  Result Date: 01/26/2020 CLINICAL DATA:  Shortness of breath EXAM: PORTABLE CHEST 1 VIEW  COMPARISON:  09/02/2019 FINDINGS: Post CABG changes. The heart size and mediastinal contours are within normal limits. Chronic interstitial prominence, similar to prior. No focal airspace consolidation, pleural effusion, or pneumothorax. The visualized skeletal structures are unremarkable. IMPRESSION: Persistent prominence of the pulmonary interstitium which could reflect chronic bronchitic-type lung changes or a component of interstitial edema. Findings are similar to prior studies. Electronically Signed   By: Davina Poke D.O.   On:  01/26/2020 14:24    Procedures Procedures (including critical care time)  Medications Ordered in ED Medications  albuterol (VENTOLIN HFA) 108 (90 Base) MCG/ACT inhaler 4 puff (4 puffs Inhalation Given 01/28/20 0822)  methylPREDNISolone sodium succinate (SOLU-MEDROL) 125 mg/2 mL injection 125 mg (125 mg Intravenous Given 01/28/20 0822)  iohexol (OMNIPAQUE) 350 MG/ML injection 100 mL (100 mLs Intravenous Contrast Given 01/28/20 1104)    ED Course  I have reviewed the triage vital signs and the nursing notes.  Pertinent labs & imaging results that were available during my care of the patient were reviewed by me and considered in my medical decision making (see chart for details).  Vitals:   01/28/20 0930 01/28/20 0945 01/28/20 1000 01/28/20 1015  BP: 122/65 131/60 (!) 120/55 122/62  Pulse: 61 62 (!) 59 64  Resp: (!) 24 (!) 23 (!) 22 (!) 21  Temp:      TempSrc:      SpO2: 100% 98% 100% 100%  Weight:      Height:          MDM Rules/Calculators/A&P                      Patient seen and examined. Patient presents awake, alert, hemodynamically stable, afebrile, non toxic. No hypoxia on her normal 3L, no tachycardia. On exam she is tachypneic.  Her lungs are clear to auscultation in all fields, no wheezing, rales or rhonchi heard.  Patient attempted to ambulate and felt more short of breath then usual, she was not hypoxic during ambulation.  EKG shows normal sinus rhythm. I viewed pt's chest xray and it does not suggest acute infectious processes. No signs of volume overload.  Labs collected in triage.  I viewed results show leukocytosis of 14.7, possibly related to steroid use.  No anemia.  BMP without severe electrolyte derangement, no renal insufficiency.  Bicarb slightly elevated at 39.  BNP within normal range at 61.  CTA chest is negative for PE or acute infectious processes.  Engaged in shared decision making and patient feels comfortable being discharged home.  She feels like her dyspnea  has improved and she can tolerate at home with increased prednisone that she already has prescription of.  The patient appears reasonably screened and/or stabilized for discharge and I doubt any other medical condition or other [facility] requiring further screening, evaluation, or treatment in the ED at this time prior to discharge. The patient is safe for discharge with strict return precautions discussed. Recommend close pulmonology follow up for further evaluation. The patient was discussed with and seen by Dr. Zenia Resides who agrees with the treatment plan.   Portions of this note were generated with [employer]. Dictation errors may occur despite best attempts at proofreading.   Final Clinical Impression(s) / ED Diagnoses Final diagnoses:  COPD exacerbation [facility])    Rx / DC Orders ED Discharge Orders    None       Flint Melter 01/28/20 1139    Lacretia Leigh, MD 01/29/20 2037

## 2020-02-01 ENCOUNTER — Other Ambulatory Visit: Payer: Self-pay

## 2020-02-01 ENCOUNTER — Ambulatory Visit: Payer: BC Managed Care – PPO | Admitting: Cardiology

## 2020-02-01 ENCOUNTER — Encounter: Payer: Self-pay | Admitting: Cardiology

## 2020-02-01 VITALS — BP 110/70 | HR 60 | Ht 61.0 in | Wt 181.4 lb

## 2020-02-01 DIAGNOSIS — Z951 Presence of aortocoronary bypass graft: Secondary | ICD-10-CM

## 2020-02-01 DIAGNOSIS — J438 Other emphysema: Secondary | ICD-10-CM | POA: Diagnosis not present

## 2020-02-01 DIAGNOSIS — I25709 Atherosclerosis of coronary artery bypass graft(s), unspecified, with unspecified angina pectoris: Secondary | ICD-10-CM | POA: Diagnosis not present

## 2020-02-01 DIAGNOSIS — I209 Angina pectoris, unspecified: Secondary | ICD-10-CM | POA: Diagnosis not present

## 2020-02-01 NOTE — Patient Instructions (Signed)
Your physician recommends that you continue on your current medications as directed. Please refer to the Current Medication list given to you today.  Your physician wants you to follow-up in: YEAR WITH DR SKAINS  You will receive a reminder letter in the mail two months in advance. If you don't receive a letter, please call our office to schedule the follow-up appointment.  

## 2020-02-01 NOTE — Progress Notes (Signed)
Cardiology Office Note:    Date:  02/01/2020   ID:  [name], DOB 06/09/1956, MRN [id number]  PCP:  Biagio Borg, MD  Cardiologist:  Candee Furbish, MD  Electrophysiologist:  None   Referring MD: Biagio Borg, MD     History of Present Illness:    [name] is a 64 y.o. female here for the follow-up of coronary artery disease post CABG, COPD, palpitations.  Has had occasional episodes of rapid heart rate, diaphoresis, headache.  Sometimes feels her head is pounding arms weak sleeps with oxygen at night because of her COPD.  She has been to the ER twice April 7 and April 9 for COPD exacerbation.  No obstructive sleep apnea.  Past Medical History:  Diagnosis Date  . Bronchitis   . CAD (coronary artery disease)    a. 02/2002: CABG x3 with LIMA to LAD, RIMA to RCA, and SVG to OM  . CHF (congestive heart failure) ([place])   . COPD (chronic obstructive pulmonary disease) ([place])   . Diabetes ([place])   . Headache   . Heart disease   . Hyperlipidemia   . Hypertension   . Impaired glucose tolerance 08/20/2014  . Sinusitis     Past Surgical History:  Procedure Laterality Date  . CORONARY ARTERY BYPASS GRAFT  2003   Triple bypass  . ESOPHAGOGASTRODUODENOSCOPY Left 06/13/2014   Procedure: ESOPHAGOGASTRODUODENOSCOPY (EGD);  Surgeon: Juanita Craver, MD;  Location: [facility] ENDOSCOPY;  Service: Endoscopy;  Laterality: Left;  . RIGID ESOPHAGOSCOPY N/A 06/12/2014   Procedure: RIGID ESOPHAGOSCOPY WITH FOREIGN BODY REMOVAL;  Surgeon: Ascencion Dike, MD;  Location: [facility] OR;  Service: ENT;  Laterality: N/A;    Current Medications: Current Meds  Medication Sig  . albuterol (PROVENTIL HFA) 108 (90 Base) MCG/ACT inhaler INHALE 1-2 PUFFS INTO THE LUNGS EVERY 6 (SIX) HOURS AS NEEDED FOR WHEEZING OR SHORTNESS OF BREATH.  Marland Kitchen amLODipine (NORVASC) 5 MG tablet TAKE 1 TABLET BY MOUTH EVERY DAY  . atorvastatin (LIPITOR) 40 MG tablet Take 1 tablet (40 mg total) by mouth daily.  . carvedilol (COREG) 12.5 MG tablet  TAKE 1 TABLET (12.5 MG TOTAL) BY MOUTH 2 (TWO) TIMES DAILY.  . cholecalciferol (VITAMIN D) 1000 units tablet Take 1,000 Units by mouth daily.  Marland Kitchen diltiazem (CARDIZEM) 30 MG tablet Take 1 tablet (30 mg total) by mouth every 6 (six) hours as needed.  . fexofenadine (ALLEGRA) 180 MG tablet Take 1 tablet (180 mg total) by mouth daily.  . Fluticasone-Umeclidin-Vilant (TRELEGY ELLIPTA) 100-62.5-25 MCG/INH AEPB Inhale 1 puff into the lungs daily.  . furosemide (LASIX) 40 MG tablet TAKE 1 TABLET BY MOUTH TWICE A DAY  . ipratropium-albuterol (DUONEB) 0.5-2.5 (3) MG/3ML SOLN TAKE 3 MLS BY NEBULIZATION EVERY 6 (SIX) HOURS AS NEEDED.  Marland Kitchen isosorbide mononitrate (IMDUR) 60 MG 24 hr tablet TAKE 1 & 1/2 TABLETS BY MOUTH DAILY  . KLOR-CON M10 10 MEQ tablet TAKE 1 TABLET BY MOUTH EVERY DAY  . metFORMIN (GLUCOPHAGE-XR) 500 MG 24 hr tablet Take 2 tablets (1,000 mg total) by mouth daily with breakfast.  . Multiple Vitamin (MULTIVITAMIN WITH MINERALS) TABS tablet Take 1 tablet by mouth daily.  . predniSONE (DELTASONE) 20 MG tablet Take 1 tablet (20 mg total) by mouth 2 (two) times daily.  Marland Kitchen triamcinolone (NASACORT) 55 MCG/ACT AERO nasal inhaler Place 2 sprays into the nose daily.  . vitamin B-12 (CYANOCOBALAMIN) 1000 MCG tablet Take 1,000 mcg by mouth daily.  . vitamin C (ASCORBIC ACID) 500 MG  tablet Take 500 mg by mouth daily.      Allergies:   Augmentin [amoxicillin-pot clavulanate]   Social History   Socioeconomic History  . Marital status: Single    Spouse name: Not on file  . Number of children: Not on file  . Years of education: Not on file  . Highest education level: Not on file  Occupational History  . Not on file  Tobacco Use  . Smoking status: Former Smoker    Packs/day: 0.50    Years: 47.00    Pack years: 23.50    Types: Cigarettes    Start date: 12/29/1971    Quit date: 05/22/2019    Years since quitting: 0.6  . Smokeless tobacco: Never Used  Substance and Sexual Activity  . Alcohol use: Yes     Alcohol/week: 5.0 - 6.0 standard drinks    Types: 5 - 6 Glasses of wine per week  . Drug use: Yes    Types: Marijuana    Comment: as a teenager  . Sexual activity: Not Currently    Birth control/protection: None  Other Topics Concern  . Not on file  Social History Narrative  . Not on file   Social Determinants of Health   Financial Resource Strain:   . Difficulty of Paying Living Expenses:   Food Insecurity:   . Worried About Charity fundraiser in the Last Year:   . Arboriculturist in the Last Year:   Transportation Needs:   . Film/video editor (Medical):   Marland Kitchen Lack of Transportation (Non-Medical):   Physical Activity:   . Days of Exercise per Week:   . Minutes of Exercise per Session:   Stress:   . Feeling of Stress :   Social Connections:   . Frequency of Communication with Friends and Family:   . Frequency of Social Gatherings with Friends and Family:   . Attends Religious Services:   . Active Member of Clubs or Organizations:   . Attends Archivist Meetings:   Marland Kitchen Marital Status:      Family History: The patient's family history includes Brain cancer in her mother; Cancer in her maternal grandmother and another family member; Heart disease in her mother; Lung cancer in her maternal aunt and paternal uncle; Stroke in an other family member; Suicidality in her brother.  ROS:   Please see the history of present illness.    No syncope, no bleeding, no chest pain all other systems reviewed and are negative.  EKGs/Labs/Other Studies Reviewed:    The following studies were reviewed today: ER visit reviewed.  CT scan of chest negative for PE.  EKG:  EKG is not ordered today.  Prior EKG reviewed and no ischemic changes noted.  Recent Labs: 03/24/2019: Pro B Natriuretic peptide (BNP) 45.0 09/06/2019: TSH 3.25 01/26/2020: ALT 33 01/28/2020: B Natriuretic Peptide 61.4; BUN 26; Creatinine, Ser 0.51; Hemoglobin 14.2; Platelets 202; Potassium 3.9; Sodium 140    Recent Lipid Panel    Component Value Date/Time   CHOL 298 (H) 09/06/2019 1229   TRIG 259.0 (H) 09/06/2019 1229   HDL 35.10 (L) 09/06/2019 1229   CHOLHDL 8 09/06/2019 1229   VLDL 51.8 (H) 09/06/2019 1229   LDLCALC 68 02/02/2014 0317   LDLDIRECT 210.0 09/06/2019 1229    Physical Exam:    VS:  BP 110/70   Pulse 60   Ht 5\' 1"  (1.549 m)   Wt 181 lb 6.4 oz (82.3 kg)   SpO2 98%  BMI 34.28 kg/m     Wt Readings from Last 3 Encounters:  02/01/20 181 lb 6.4 oz (82.3 kg)  01/28/20 180 lb (81.6 kg)  01/07/20 181 lb (82.1 kg)     GEN:  Well nourished, well developed in no acute distress HEENT: Normal on oxygen. NECK: No JVD; No carotid bruits LYMPHATICS: No lymphadenopathy CARDIAC: RRR, no murmurs, rubs, gallops RESPIRATORY: No wheezes heard but poor air movement ABDOMEN: Soft, non-tender, non-distended MUSCULOSKELETAL:  No edema; No deformity  SKIN: Warm and dry NEUROLOGIC:  Alert and oriented x 3 PSYCHIATRIC:  Normal affect   ASSESSMENT:    1. Coronary artery disease involving coronary bypass graft of native heart with angina pectoris ([place])   2. Other emphysema ([place])   3. Status post coronary artery bypass grafting   4. Angina pectoris ([place])    PLAN:    In order of problems listed above:  Paroxysmal atrial tachycardia -Taking the carvedilol 12.5 twice a day.  We also gave her Cardizem 30 mg every 6 hours to take as needed for palpitations, she has not needed to take any.  Previously EF normal. -Sometimes this happens when her lungs are giving her trouble.  Anxiety.  COPD -Recent exacerbation a few days ago.  Prednisone taper.  Diabetes with hypertension -Blood sugars have been a little bit elevated since taking the prednisone  CAD with angina -On isosorbide, doing well.  90 mg total a day.  Also on carvedilol.  On amlodipine 5 mg as well.  Stable.  Tobacco use -Continue to encourage cessation.  Medication Adjustments/Labs and Tests Ordered: Current  medicines are reviewed at length with the patient today.  Concerns regarding medicines are outlined above.  No orders of the defined types were placed in this encounter.  No orders of the defined types were placed in this encounter.   Patient Instructions  Your physician recommends that you continue on your current medications as directed. Please refer to the Current Medication list given to you today.   Your physician wants you to follow-up in: [place] will receive a reminder letter in the mail two months in advance. If you don't receive a letter, please call our office to schedule the follow-up appointment.    Signed, Candee Furbish, MD  02/01/2020 10:47 AM    [facility] [place]

## 2020-02-13 ENCOUNTER — Other Ambulatory Visit: Payer: Self-pay | Admitting: Internal Medicine

## 2020-02-13 DIAGNOSIS — J449 Chronic obstructive pulmonary disease, unspecified: Secondary | ICD-10-CM | POA: Diagnosis not present

## 2020-02-13 NOTE — Telephone Encounter (Signed)
[place] for lasix refill only -    Please refill as per office routine med refill policy (all routine meds refilled for 3 mo or monthly per pt preference up to one year from last visit, then month to month grace period for 3 mo, then further med refills will have to be denied)

## 2020-02-14 ENCOUNTER — Other Ambulatory Visit: Payer: Self-pay

## 2020-02-14 DIAGNOSIS — I25119 Atherosclerotic heart disease of native coronary artery with unspecified angina pectoris: Secondary | ICD-10-CM

## 2020-02-14 MED ORDER — FUROSEMIDE 40 MG PO TABS: 40.0000 mg | ORAL_TABLET | Freq: Two times a day (BID) | ORAL | 0 refills | Status: DC

## 2020-02-14 NOTE — Telephone Encounter (Signed)
Refill for Lasix ONLY sent in this morning.

## 2020-03-02 ENCOUNTER — Encounter: Payer: Self-pay | Admitting: Internal Medicine

## 2020-03-02 DIAGNOSIS — E119 Type 2 diabetes mellitus without complications: Secondary | ICD-10-CM

## 2020-03-06 ENCOUNTER — Other Ambulatory Visit: Payer: Self-pay

## 2020-03-06 ENCOUNTER — Ambulatory Visit (INDEPENDENT_AMBULATORY_CARE_PROVIDER_SITE_OTHER): Payer: BC Managed Care – PPO | Admitting: Internal Medicine

## 2020-03-06 VITALS — BP 130/68 | HR 91 | Temp 98.9°F | Ht 61.0 in | Wt 176.0 lb

## 2020-03-06 DIAGNOSIS — I10 Essential (primary) hypertension: Secondary | ICD-10-CM

## 2020-03-06 DIAGNOSIS — E785 Hyperlipidemia, unspecified: Secondary | ICD-10-CM

## 2020-03-06 DIAGNOSIS — E119 Type 2 diabetes mellitus without complications: Secondary | ICD-10-CM

## 2020-03-06 DIAGNOSIS — E559 Vitamin D deficiency, unspecified: Secondary | ICD-10-CM | POA: Diagnosis not present

## 2020-03-06 MED ORDER — TRULICITY 0.75 MG/0.5ML ~~LOC~~ SOAJ: 0.7500 mg | SUBCUTANEOUS | 3 refills | Status: DC

## 2020-03-06 MED ORDER — PREDNISONE 10 MG PO TABS: 5.0000 mg | ORAL_TABLET | Freq: Every day | ORAL | 3 refills | Status: DC

## 2020-03-06 NOTE — Progress Notes (Signed)
Subjective:    Patient ID: [name], female    DOB: [birth year], 64 y.o.   MRN: [id number]  HPI  Here to f/u; overall doing ok,  Pt denies chest pain, increasing sob or doe, wheezing, orthopnea, PND, increased LE swelling, palpitations, dizziness or syncope.  Pt denies new neurological symptoms such as new headache, or facial or extremity weakness or numbness.  Pt denies polydipsia, polyuria, or low sugar episode.  Pt states overall good compliance with meds, mostly trying to follow appropriate diet, with wt overall stable,  but little exercise however. Wt Readings from Last 3 Encounters:  03/06/20 176 lb (79.8 kg)  02/01/20 181 lb 6.4 oz (82.3 kg)  01/28/20 180 lb (81.6 kg)   Past Medical History:  Diagnosis Date  . Bronchitis   . CAD (coronary artery disease)    a. 02/2002: CABG x3 with LIMA to LAD, RIMA to RCA, and SVG to OM  . CHF (congestive heart failure) ([place])   . COPD (chronic obstructive pulmonary disease) ([place])   . Diabetes ([place])   . Headache   . Heart disease   . Hyperlipidemia   . Hypertension   . Impaired glucose tolerance 08/20/2014  . Sinusitis    Past Surgical History:  Procedure Laterality Date  . CORONARY ARTERY BYPASS GRAFT  2003   Triple bypass  . ESOPHAGOGASTRODUODENOSCOPY Left 06/13/2014   Procedure: ESOPHAGOGASTRODUODENOSCOPY (EGD);  Surgeon: Juanita Craver, MD;  Location: [facility] ENDOSCOPY;  Service: Endoscopy;  Laterality: Left;  . RIGID ESOPHAGOSCOPY N/A 06/12/2014   Procedure: RIGID ESOPHAGOSCOPY WITH FOREIGN BODY REMOVAL;  Surgeon: Ascencion Dike, MD;  Location: [facility] OR;  Service: ENT;  Laterality: N/A;    reports that she quit smoking about 9 months ago. Her smoking use included cigarettes. She started smoking about 48 years ago. She has a 23.50 pack-year smoking history. She has never used smokeless tobacco. She reports current alcohol use of about 5.0 - 6.0 standard drinks of alcohol per week. She reports current drug use. Drug: Marijuana. family history  includes Brain cancer in her mother; Cancer in her maternal grandmother and another family member; Heart disease in her mother; Lung cancer in her maternal aunt and paternal uncle; Stroke in an other family member; Suicidality in her brother. Allergies  Allergen Reactions  . Augmentin [Amoxicillin-Pot Clavulanate] Nausea And Vomiting   Current Outpatient Medications on File Prior to Visit  Medication Sig Dispense Refill  . albuterol (PROVENTIL HFA) 108 (90 Base) MCG/ACT inhaler INHALE 1-2 PUFFS INTO THE LUNGS EVERY 6 (SIX) HOURS AS NEEDED FOR WHEEZING OR SHORTNESS OF BREATH. 18 g 2  . atorvastatin (LIPITOR) 40 MG tablet Take 1 tablet (40 mg total) by mouth daily. 90 tablet 3  . carvedilol (COREG) 12.5 MG tablet TAKE 1 TABLET (12.5 MG TOTAL) BY MOUTH 2 (TWO) TIMES DAILY. 60 tablet 9  . cholecalciferol (VITAMIN D) 1000 units tablet Take 1,000 Units by mouth daily.    . Fluticasone-Umeclidin-Vilant (TRELEGY ELLIPTA) 100-62.5-25 MCG/INH AEPB Inhale 1 puff into the lungs daily. 60 each 5  . furosemide (LASIX) 40 MG tablet Take 1 tablet (40 mg total) by mouth 2 (two) times daily. 180 tablet 0  . ipratropium-albuterol (DUONEB) 0.5-2.5 (3) MG/3ML SOLN TAKE 3 MLS BY NEBULIZATION EVERY 6 (SIX) HOURS AS NEEDED. 360 mL 5  . isosorbide mononitrate (IMDUR) 60 MG 24 hr tablet TAKE 1 & 1/2 TABLETS BY MOUTH DAILY 45 tablet 9  . KLOR-CON M10 10 MEQ tablet TAKE 1 TABLET BY MOUTH EVERY  DAY 90 tablet 0  . metFORMIN (GLUCOPHAGE-XR) 500 MG 24 hr tablet Take 2 tablets (1,000 mg total) by mouth daily with breakfast. 180 tablet 3  . Multiple Vitamin (MULTIVITAMIN WITH MINERALS) TABS tablet Take 1 tablet by mouth daily.    . traZODone (DESYREL) 50 MG tablet TAKE 1/2 TO 1 TABLET BY MOUTH AT BEDTIME AS NEEDED FOR SLEEP 90 tablet 0  . triamcinolone (NASACORT) 55 MCG/ACT AERO nasal inhaler Place 2 sprays into the nose daily. 1 Inhaler 12  . vitamin B-12 (CYANOCOBALAMIN) 1000 MCG tablet Take 1,000 mcg by mouth daily.    .  vitamin C (ASCORBIC ACID) 500 MG tablet Take 500 mg by mouth daily.      No current facility-administered medications on file prior to visit.   Review of Systems All otherwise neg per pt     Objective:   Physical Exam BP 130/68 (BP Location: Left Arm, Patient Position: Sitting, Cuff Size: Large)   Pulse 91   Temp 98.9 F (37.2 C) (Oral)   Ht 5\' 1"  (1.549 m)   Wt 176 lb (79.8 kg)   SpO2 95%   BMI 33.25 kg/m  VS noted,  Constitutional: Pt appears in NAD HENT: Head: NCAT.  Right Ear: External ear normal.  Left Ear: External ear normal.  Eyes: . Pupils are equal, round, and reactive to light. Conjunctivae and EOM are normal Nose: without d/c or deformity Neck: Neck supple. Gross normal ROM Cardiovascular: Normal rate and regular rhythm.   Pulmonary/Chest: Effort normal and breath sounds without rales or wheezing.  Abd:  Soft, NT, ND, + BS, no organomegaly Neurological: Pt is alert. At baseline orientation, motor grossly intact Skin: Skin is warm. No rashes, other new lesions, no LE edema Psychiatric: Pt behavior is normal without agitation  All otherwise neg per pt   Lab Results  Component Value Date   WBC 14.7 (H) 01/28/2020   HGB 14.2 01/28/2020   HCT 46.3 (H) 01/28/2020   PLT 202 01/28/2020   GLUCOSE 155 (H) 01/28/2020   CHOL 298 (H) 09/06/2019   TRIG 259.0 (H) 09/06/2019   HDL 35.10 (L) 09/06/2019   LDLDIRECT 210.0 09/06/2019   LDLCALC 68 02/02/2014   ALT 33 01/26/2020   AST 23 01/26/2020   NA 140 01/28/2020   K 3.9 01/28/2020   CL 91 (L) 01/28/2020   CREATININE 0.51 01/28/2020   BUN 26 (H) 01/28/2020   CO2 39 (H) 01/28/2020   TSH 3.25 09/06/2019   HGBA1C 6.7 (A) 01/03/2020       Assessment & Plan:

## 2020-03-06 NOTE — Patient Instructions (Addendum)
Please take all new medication as prescribed - the trulicity  Please continue all other medications as before, and refills have been done if requested.  Please have the pharmacy call with any other refills you may need.  Please continue your efforts at being more active, low cholesterol diet, and weight control.  Please keep your appointments with your specialists as you may have planned  Please make an Appointment to return in 1 months, or sooner if needed, also with Lab Appointment for testing done 3-5 days before at the [facility] (so this is for TWO appointments - please see the scheduling desk as you leave)

## 2020-03-07 ENCOUNTER — Encounter: Payer: Self-pay | Admitting: Internal Medicine

## 2020-03-07 ENCOUNTER — Other Ambulatory Visit: Payer: Self-pay | Admitting: Cardiology

## 2020-03-07 NOTE — Assessment & Plan Note (Addendum)
For oral replacement  I spent 31 minutes in preparing to see the patient by review of recent labs, imaging and procedures, obtaining and reviewing separately obtained history, communicating with the patient and family or caregiver, ordering medications, tests or procedures, and documenting clinical information in the EHR including the differential Dx, treatment, and any further evaluation and other management of vit d def, hld, htn dm

## 2020-03-07 NOTE — Assessment & Plan Note (Signed)
stable overall by history and exam, recent data reviewed with pt, and pt to continue medical treatment as before,  to f/u any worsening symptoms or concerns  

## 2020-03-07 NOTE — Assessment & Plan Note (Signed)
Mild uncontrolled, for trulicity asd

## 2020-03-11 ENCOUNTER — Other Ambulatory Visit: Payer: Self-pay | Admitting: Cardiology

## 2020-03-13 ENCOUNTER — Encounter: Payer: Self-pay | Admitting: Internal Medicine

## 2020-03-14 DIAGNOSIS — J449 Chronic obstructive pulmonary disease, unspecified: Secondary | ICD-10-CM | POA: Diagnosis not present

## 2020-03-14 NOTE — Telephone Encounter (Signed)
[place] for staff to let pt know above

## 2020-03-15 NOTE — Telephone Encounter (Signed)
Spoke with pt and informed her of Dr. Judi Cong orders. Pt understood and has questions or concerns at this time.

## 2020-03-21 ENCOUNTER — Encounter: Payer: Self-pay | Admitting: Internal Medicine

## 2020-03-24 ENCOUNTER — Other Ambulatory Visit: Payer: Self-pay | Admitting: Pulmonary Disease

## 2020-03-24 ENCOUNTER — Telehealth: Payer: Self-pay

## 2020-03-24 ENCOUNTER — Other Ambulatory Visit: Payer: Self-pay

## 2020-03-24 DIAGNOSIS — J449 Chronic obstructive pulmonary disease, unspecified: Secondary | ICD-10-CM

## 2020-03-24 MED ORDER — PREDNISONE 10 MG PO TABS: 5.0000 mg | ORAL_TABLET | Freq: Every day | ORAL | 3 refills | Status: DC

## 2020-03-24 NOTE — Telephone Encounter (Signed)
   Patient states she needs to discuss issue with Prednisone Patient states Dr Jenny Reichmann does not prescribe the Prednisone, unable to get refill until issue resolved. Please  call

## 2020-03-24 NOTE — Telephone Encounter (Signed)
I spoke with Ms. Arambula and provided the information regarding the Prednisone being prescribed by Dr. Jenny Reichmann in May of 2021 for 90 tablets.  She states she was not prescribed 90 tablets.  I explained that based on the documentation in the electronic record, our provider is not going to refill the prednisone at this time.  I advised her to contact her pharmacy and Dr. Gwynn Burly office to determine what was prescribed.  She verbalized understanding. I will refuse the refill at this time.

## 2020-03-24 NOTE — Telephone Encounter (Signed)
Dr. Jenny Reichmann is a primary care provider.  That to last refilled this medication.  Patient does need to likely be maintained on chronic steroids.  Per chart review it looks like this was filled in May/2021 and this was a 90 tablet supply with patient taking 0.5 tablets daily.  Patient should not need a refill at this point in time.Please contact the patient and further investigate.  Patient also needs to be scheduled for follow-up with Dr. Elsworth Soho.Rhonda Quaker, FNP

## 2020-03-24 NOTE — Telephone Encounter (Signed)
New message    The patient is asking for the CMA to call her back to discuss predniSONE (DELTASONE) 10 MG tablet

## 2020-03-24 NOTE — Telephone Encounter (Signed)
Spoke with pt about prednisone rx that was sent from Dr. Gwynn Burly office. Pt stated he was not the original provider to send in her rx's for the prednisone. I offered to discontinue the med from the chart so pt can have her other provider rx it and she stated she would like me to send the rx from Dr. Jenny Reichmann instead.

## 2020-03-25 ENCOUNTER — Encounter: Payer: Self-pay | Admitting: Internal Medicine

## 2020-03-27 NOTE — Telephone Encounter (Signed)
[name] -   Please note the predisone refill was not actually sent to pharmacy by my office on June 4 - it is marked "sample" only which means the prescription was not approved or sent.  I agree there was likely a better way to convey this to you, but in the meantime, I was hoping you would continue to be responsible for chronic prednisone refills for this patient

## 2020-03-27 NOTE — Telephone Encounter (Signed)
   Patient please return call to patient. Advised patient Dr Jenny Reichmann will be in office this afternoon to provide direction

## 2020-03-28 ENCOUNTER — Telehealth: Payer: Self-pay | Admitting: Pulmonary Disease

## 2020-03-28 ENCOUNTER — Telehealth: Payer: Self-pay | Admitting: Internal Medicine

## 2020-03-28 NOTE — Telephone Encounter (Signed)
Spoke with pt, she is needing a refill on Prednisone 5mg . I mentioned that Dr. Jenny Reichmann gave her the last RX for prednisone but she stated he does not write this for her and that it was a mistake. Can we refill Rx for prednisone? Aaron Edelman please advise. She is due for 3 month f/u    Patient Instructions by Lauraine Rinne, NP at 01/07/2020 11:30 AM Author: Lauraine Rinne, NP Author Type: Nurse Practitioner Filed: 01/07/2020 12:19 PM  Note Status: Addendum Cosign: Cosign Not Required Encounter Date: 01/07/2020  Editor: Lauraine Rinne, NP (Nurse Practitioner)  Prior Versions: 1. Lauraine Rinne, NP (Nurse Practitioner) at 01/07/2020 12:19 PM - Addendum   2. Lauraine Rinne, NP (Nurse Practitioner) at 01/07/2020 12:19 PM - Signed    You were seen today by Lauraine Rinne, NP  for:   It was good seeing you today. Overall you had a good physical exam. I am happy with this. We will refill your prednisone 5 mg daily. Continue Trelegy Ellipta. We will have you start doing nasal saline rinses in addition to maintaining your Allegra to see if I can help with your nasal congestion. Continue oxygen therapy as prescribed.  We will see you back in our office in around 2 to 3 months. Take care and stay safe,  Brian  1. COPD (chronic obstructive pulmonary disease) with chronic bronchitis (HCC)  Trelegy Ellipta  >>> 1 puff daily in the morning >>>rinse mouth out after use  >>> This inhaler contains 3 medications that help manage her respiratory status, contact our office if you cannot afford this medication or unable to remain on this medication  Continue prednisone 5mg  daily   Only use your albuterol as a rescue medication to be used if you can't catch your breath by resting or doing a relaxed purse lip breathing pattern.  - The less you use it, the better it will work when you need it. - Ok to use up to 2 puffs every 4 hours if you must but call for immediate appointment if use goes up over your usual need - Don't  leave home without it !! (think of it like the spare tire for your car)   Note your daily symptoms >remember "red flags" for COPD:  >>>Increase in cough >>>increase in sputum production >>>increase in shortness of breath or activity  intolerance.   If you notice these symptoms, please call the office to be seen.   2. Allergic rhinitis, unspecified seasonality, unspecified trigger  Continue Allegra daily  Start nasal saline rinses 1-2 times daily Sample provided today Use distilled water Get water lukewarm like a baby bottle Shake well   3. Chronic respiratory failure with hypoxia (HCC)  Continue oxygen therapy as prescribed  >>>maintain oxygen saturations greater than 88 percent  >>>if unable to maintain oxygen saturations please contact the office  >>>do not smoke with oxygen  >>>can use nasal saline gel or nasal saline rinses to moisturize nose if oxygen causes dryness  4. Former smoker  Continue to not smoke     Follow Up:    Return in about 3 months (around 04/08/2020), or if symptoms worsen or fail to improve, for Follow up with Dr. Elsworth Soho.

## 2020-03-28 NOTE — Telephone Encounter (Signed)
New message:    Pt is calling and would like to talk to MA about her medication mix up. Please advise.

## 2020-03-29 MED ORDER — PREDNISONE 5 MG PO TABS: 5.0000 mg | ORAL_TABLET | Freq: Every day | ORAL | 0 refills | Status: DC

## 2020-03-29 NOTE — Telephone Encounter (Signed)
03/29/2020  Yes okay to refill 30-day prescription for prednisone 5 mg.  I thought this was already previously addressed on another telephone note.  It is extremely important the patient be scheduled for next available appointment with Dr. Elsworth Soho or an APP.  I would prefer for this to be in office.  Wyn Quaker, FNP

## 2020-03-29 NOTE — Telephone Encounter (Signed)
New message:   Pt is calling and states she would like to speak with the Dr due to the medication mix up. She states that Dr. Jenny Reichmann does not fill her predniSone and because he did she can't get it from her Pulmonary dr. Please advise.

## 2020-03-29 NOTE — Telephone Encounter (Signed)
Spoke with pt and advised rx sent to pharmacy. Nothing further is needed.   Made appt on 05/01/2020 at 2:00pm.

## 2020-03-30 DIAGNOSIS — J449 Chronic obstructive pulmonary disease, unspecified: Secondary | ICD-10-CM | POA: Diagnosis not present

## 2020-03-31 ENCOUNTER — Other Ambulatory Visit: Payer: Self-pay

## 2020-03-31 ENCOUNTER — Other Ambulatory Visit (INDEPENDENT_AMBULATORY_CARE_PROVIDER_SITE_OTHER): Payer: BC Managed Care – PPO

## 2020-03-31 DIAGNOSIS — E559 Vitamin D deficiency, unspecified: Secondary | ICD-10-CM

## 2020-03-31 DIAGNOSIS — E119 Type 2 diabetes mellitus without complications: Secondary | ICD-10-CM | POA: Diagnosis not present

## 2020-03-31 LAB — CBC WITH DIFFERENTIAL/PLATELET
Basophils Absolute: 0 10*3/uL (ref 0.0–0.1)
Basophils Relative: 0.4 % (ref 0.0–3.0)
Eosinophils Absolute: 0 10*3/uL (ref 0.0–0.7)
Eosinophils Relative: 0.4 % (ref 0.0–5.0)
HCT: 41.5 % (ref 36.0–46.0)
Hemoglobin: 13.6 g/dL (ref 12.0–15.0)
Lymphocytes Relative: 13.1 % (ref 12.0–46.0)
Lymphs Abs: 1.4 10*3/uL (ref 0.7–4.0)
MCHC: 32.9 g/dL (ref 30.0–36.0)
MCV: 89.2 fl (ref 78.0–100.0)
Monocytes Absolute: 0.4 10*3/uL (ref 0.1–1.0)
Monocytes Relative: 3.3 % (ref 3.0–12.0)
Neutro Abs: 9 10*3/uL — ABNORMAL HIGH (ref 1.4–7.7)
Neutrophils Relative %: 82.8 % — ABNORMAL HIGH (ref 43.0–77.0)
Platelets: 206 10*3/uL (ref 150.0–400.0)
RBC: 4.65 Mil/uL (ref 3.87–5.11)
RDW: 14.3 % (ref 11.5–15.5)
WBC: 10.8 10*3/uL — ABNORMAL HIGH (ref 4.0–10.5)

## 2020-03-31 LAB — HEPATIC FUNCTION PANEL
ALT: 30 U/L (ref 0–35)
AST: 23 U/L (ref 0–37)
Albumin: 4.3 g/dL (ref 3.5–5.2)
Alkaline Phosphatase: 111 U/L (ref 39–117)
Bilirubin, Direct: 0.1 mg/dL (ref 0.0–0.3)
Total Bilirubin: 0.5 mg/dL (ref 0.2–1.2)
Total Protein: 7.1 g/dL (ref 6.0–8.3)

## 2020-03-31 LAB — BASIC METABOLIC PANEL
BUN: 15 mg/dL (ref 6–23)
CO2: 40 mEq/L — ABNORMAL HIGH (ref 19–32)
Calcium: 9.6 mg/dL (ref 8.4–10.5)
Chloride: 95 mEq/L — ABNORMAL LOW (ref 96–112)
Creatinine, Ser: 0.4 mg/dL (ref 0.40–1.20)
GFR: 160.52 mL/min (ref >60.00)
Glucose, Bld: 143 mg/dL — ABNORMAL HIGH (ref 70–99)
Potassium: 4 mEq/L (ref 3.5–5.1)
Sodium: 139 mEq/L (ref 135–145)

## 2020-03-31 LAB — LIPID PANEL
Cholesterol: 146 mg/dL (ref 0–200)
HDL: 36.9 mg/dL — ABNORMAL LOW (ref >39.00)
NonHDL: 109.42
Total CHOL/HDL Ratio: 4
Triglycerides: 308 mg/dL — ABNORMAL HIGH (ref 0.0–149.0)
VLDL: 61.6 mg/dL — ABNORMAL HIGH (ref 0.0–40.0)

## 2020-03-31 LAB — VITAMIN D 25 HYDROXY (VIT D DEFICIENCY, FRACTURES): VITD: 24.67 ng/mL — ABNORMAL LOW (ref 30.00–100.00)

## 2020-03-31 LAB — LDL CHOLESTEROL, DIRECT: Direct LDL: 68 mg/dL

## 2020-04-03 ENCOUNTER — Encounter: Payer: Self-pay | Admitting: Internal Medicine

## 2020-04-03 LAB — HEMOGLOBIN A1C: Hgb A1c MFr Bld: 6.9 % — ABNORMAL HIGH (ref 4.6–6.5)

## 2020-04-05 ENCOUNTER — Other Ambulatory Visit: Payer: Self-pay | Admitting: Internal Medicine

## 2020-04-05 MED ORDER — VITAMIN D (ERGOCALCIFEROL) 1.25 MG (50000 UNIT) PO CAPS: 50000.0000 [IU] | ORAL_CAPSULE | ORAL | 0 refills | Status: DC

## 2020-04-05 NOTE — Telephone Encounter (Signed)
Is she still on prednisone? Prednisone 10 mg tabs  Take 2 tabs daily with food x 5ds, then 1 tab daily with food x 5ds

## 2020-04-05 NOTE — Telephone Encounter (Signed)
Received the following message from patient:   "Hello Dr. Aaron Edelman,   I want to go on vacation with my family in two weeks but I'm afraid to go.  This week it's been harder for me to breath.  Walking  in the house tires me out.  I have a inogen that I  use when I go  out.  The phelm is not coming up and I can hear some wheezing. I am using the nebulizer it last for a short time.  Do you have any recommendations.  I hope so."  I sent a message back to see if she was able to check her O2 levels at all.   Dr. Elsworth Soho, please advise. Thanks!

## 2020-04-06 MED ORDER — PREDNISONE 10 MG PO TABS
ORAL_TABLET | ORAL | 0 refills | Status: DC
Start: 2020-04-06 — End: 2020-05-01

## 2020-04-06 NOTE — Telephone Encounter (Signed)
Yes, after taper, resume 5 mg per day

## 2020-04-12 ENCOUNTER — Other Ambulatory Visit: Payer: Self-pay

## 2020-04-12 ENCOUNTER — Encounter: Payer: Self-pay | Admitting: Internal Medicine

## 2020-04-12 ENCOUNTER — Ambulatory Visit (INDEPENDENT_AMBULATORY_CARE_PROVIDER_SITE_OTHER): Payer: BC Managed Care – PPO | Admitting: Internal Medicine

## 2020-04-12 DIAGNOSIS — E119 Type 2 diabetes mellitus without complications: Secondary | ICD-10-CM

## 2020-04-12 DIAGNOSIS — E785 Hyperlipidemia, unspecified: Secondary | ICD-10-CM

## 2020-04-12 DIAGNOSIS — I10 Essential (primary) hypertension: Secondary | ICD-10-CM | POA: Diagnosis not present

## 2020-04-12 DIAGNOSIS — E559 Vitamin D deficiency, unspecified: Secondary | ICD-10-CM

## 2020-04-12 MED ORDER — METFORMIN HCL ER 500 MG PO TB24: ORAL_TABLET | ORAL | 3 refills | Status: DC

## 2020-04-12 NOTE — Progress Notes (Signed)
Subjective:    Patient ID: [name], female    DOB: [birth year], 64 y.o.   MRN: [id number]  HPI  Here to f/u; overall doing ok,  Pt denies chest pain, increasing sob or doe, wheezing, orthopnea, PND, increased LE swelling, palpitations, dizziness or syncope.  Pt denies new neurological symptoms such as new headache, or facial or extremity weakness or numbness.  Pt denies polydipsia, polyuria, or low sugar episode.  Pt states overall good compliance with meds, mostly trying to follow appropriate diet, with wt overall stable,  but little exercise however. cbgs at home 107-120, so good she stopped her metformin, except will take 1 metformin for elev sugars when takes the prednisone Past Medical History:  Diagnosis Date  . Bronchitis   . CAD (coronary artery disease)    a. 02/2002: CABG x3 with LIMA to LAD, RIMA to RCA, and SVG to OM  . CHF (congestive heart failure) ([place])   . COPD (chronic obstructive pulmonary disease) ([place])   . Diabetes ([place])   . Headache   . Heart disease   . Hyperlipidemia   . Hypertension   . Impaired glucose tolerance 08/20/2014  . Sinusitis    Past Surgical History:  Procedure Laterality Date  . CORONARY ARTERY BYPASS GRAFT  2003   Triple bypass  . ESOPHAGOGASTRODUODENOSCOPY Left 06/13/2014   Procedure: ESOPHAGOGASTRODUODENOSCOPY (EGD);  Surgeon: Juanita Craver, MD;  Location: [facility] ENDOSCOPY;  Service: Endoscopy;  Laterality: Left;  . RIGID ESOPHAGOSCOPY N/A 06/12/2014   Procedure: RIGID ESOPHAGOSCOPY WITH FOREIGN BODY REMOVAL;  Surgeon: Ascencion Dike, MD;  Location: [facility] OR;  Service: ENT;  Laterality: N/A;    reports that she quit smoking about 10 months ago. Her smoking use included cigarettes. She started smoking about 48 years ago. She has a 23.50 pack-year smoking history. She has never used smokeless tobacco. She reports current alcohol use of about 5.0 - 6.0 standard drinks of alcohol per week. She reports current drug use. Drug: Marijuana. family history  includes Brain cancer in her mother; Cancer in her maternal grandmother and another family member; Heart disease in her mother; Lung cancer in her maternal aunt and paternal uncle; Stroke in an other family member; Suicidality in her brother. Allergies  Allergen Reactions  . Augmentin [Amoxicillin-Pot Clavulanate] Nausea And Vomiting   Current Outpatient Medications on File Prior to Visit  Medication Sig Dispense Refill  . albuterol (PROVENTIL HFA) 108 (90 Base) MCG/ACT inhaler INHALE 1-2 PUFFS INTO THE LUNGS EVERY 6 (SIX) HOURS AS NEEDED FOR WHEEZING OR SHORTNESS OF BREATH. 18 g 2  . amLODipine (NORVASC) 5 MG tablet TAKE 1 TABLET BY MOUTH EVERY DAY 90 tablet 3  . atorvastatin (LIPITOR) 40 MG tablet Take 1 tablet (40 mg total) by mouth daily. 90 tablet 3  . carvedilol (COREG) 12.5 MG tablet TAKE 1 TABLET BY MOUTH TWICE A DAY 180 tablet 3  . cholecalciferol (VITAMIN D) 1000 units tablet Take 1,000 Units by mouth daily.    . Dulaglutide (TRULICITY) 4.33 IR/5.1OA SOPN Inject 0.75 mg into the skin once a week. 6 mL 3  . Fluticasone-Umeclidin-Vilant (TRELEGY ELLIPTA) 100-62.5-25 MCG/INH AEPB Inhale 1 puff into the lungs daily. 60 each 5  . furosemide (LASIX) 40 MG tablet Take 1 tablet (40 mg total) by mouth 2 (two) times daily. 180 tablet 0  . ipratropium-albuterol (DUONEB) 0.5-2.5 (3) MG/3ML SOLN TAKE 3 MLS BY NEBULIZATION EVERY 6 (SIX) HOURS AS NEEDED. 360 mL 5  . isosorbide mononitrate (IMDUR) 60 MG 24  hr tablet TAKE 1 & 1/2 TABLETS BY MOUTH DAILY 45 tablet 9  . KLOR-CON M10 10 MEQ tablet TAKE 1 TABLET BY MOUTH EVERY DAY 90 tablet 0  . Multiple Vitamin (MULTIVITAMIN WITH MINERALS) TABS tablet Take 1 tablet by mouth daily.    . predniSONE (DELTASONE) 10 MG tablet Take 0.5 tablets (5 mg total) by mouth daily with breakfast. 90 tablet 3  . predniSONE (DELTASONE) 10 MG tablet Take 2 tabs daily with food for 5 days, then drop down to 1 tab daily with food for 5 days. Resume 5mg  once daily. 15 tablet 0    . predniSONE (DELTASONE) 5 MG tablet Take 1 tablet (5 mg total) by mouth daily with breakfast. 30 tablet 0  . traZODone (DESYREL) 50 MG tablet TAKE 1/2 TO 1 TABLET BY MOUTH AT BEDTIME AS NEEDED FOR SLEEP 90 tablet 0  . triamcinolone (NASACORT) 55 MCG/ACT AERO nasal inhaler Place 2 sprays into the nose daily. 1 Inhaler 12  . vitamin B-12 (CYANOCOBALAMIN) 1000 MCG tablet Take 1,000 mcg by mouth daily.    . vitamin C (ASCORBIC ACID) 500 MG tablet Take 500 mg by mouth daily.     . Vitamin D, Ergocalciferol, (DRISDOL) 1.25 MG (50000 UNIT) CAPS capsule Take 1 capsule (50,000 Units total) by mouth every 7 (seven) days. 12 capsule 0   No current facility-administered medications on file prior to visit.   Review of Systems All otherwise neg per pt     Objective:   Physical Exam BP 118/82 (BP Location: Left Arm, Patient Position: Sitting, Cuff Size: Large)   Pulse 74   Temp 99.2 F (37.3 C) (Oral)   Ht 5\' 1"  (1.549 m)   Wt 176 lb (79.8 kg)   SpO2 96%   BMI 33.25 kg/m  VS noted,  Constitutional: Pt appears in NAD HENT: Head: NCAT.  Right Ear: External ear normal.  Left Ear: External ear normal.  Eyes: . Pupils are equal, round, and reactive to light. Conjunctivae and EOM are normal Nose: without d/c or deformity Neck: Neck supple. Gross normal ROM Cardiovascular: Normal rate and regular rhythm.   Pulmonary/Chest: Effort normal and breath sounds without rales or wheezing.  Abd:  Soft, NT, ND, + BS, no organomegaly Neurological: Pt is alert. At baseline orientation, motor grossly intact Skin: Skin is warm. No rashes, other new lesions, no LE edema Psychiatric: Pt behavior is normal without agitation  All otherwise neg per pt Lab Results  Component Value Date   WBC 10.8 (H) 03/31/2020   HGB 13.6 03/31/2020   HCT 41.5 03/31/2020   PLT 206.0 03/31/2020   GLUCOSE 143 (H) 03/31/2020   CHOL 146 03/31/2020   TRIG 308.0 (H) 03/31/2020   HDL 36.90 (L) 03/31/2020   LDLDIRECT 68.0  03/31/2020   LDLCALC 68 02/02/2014   ALT 30 03/31/2020   AST 23 03/31/2020   NA 139 03/31/2020   K 4.0 03/31/2020   CL 95 (L) 03/31/2020   CREATININE 0.40 03/31/2020   BUN 15 03/31/2020   CO2 40 (H) 03/31/2020   TSH 3.25 09/06/2019   HGBA1C 6.9 (H) 03/31/2020        Assessment & Plan:

## 2020-04-12 NOTE — Patient Instructions (Signed)
Please continue all other medications as before, and refills have been done if requested.  Please have the pharmacy call with any other refills you may need.  Please continue your efforts at being more active, low cholesterol diet, and weight control.  Please keep your appointments with your specialists as you may have planned  Please make an Appointment to return in 6 months, or sooner if needed 

## 2020-04-14 DIAGNOSIS — J449 Chronic obstructive pulmonary disease, unspecified: Secondary | ICD-10-CM | POA: Diagnosis not present

## 2020-04-18 ENCOUNTER — Encounter: Payer: Self-pay | Admitting: Internal Medicine

## 2020-04-18 NOTE — Assessment & Plan Note (Addendum)
Improved, cont trulicity, and metformin prn prednisone  I spent 31 minutes in preparing to see the patient by review of recent labs, imaging and procedures, obtaining and reviewing separately obtained history, communicating with the patient and family or caregiver, ordering medications, tests or procedures, and documenting clinical information in the EHR including the differential Dx, treatment, and any further evaluation and other management of dm, vit d deficiency, htn, hld

## 2020-04-18 NOTE — Assessment & Plan Note (Signed)
stable overall by history and exam, recent data reviewed with pt, and pt to continue medical treatment as before,  to f/u any worsening symptoms or concerns  

## 2020-04-18 NOTE — Assessment & Plan Note (Signed)
Cont oral replacement 

## 2020-04-22 ENCOUNTER — Other Ambulatory Visit: Payer: Self-pay | Admitting: Pulmonary Disease

## 2020-04-25 ENCOUNTER — Other Ambulatory Visit: Payer: Self-pay | Admitting: Cardiology

## 2020-04-25 NOTE — Telephone Encounter (Signed)
Pt is requesting refill on predisone next f/u on 05/01/20

## 2020-04-27 ENCOUNTER — Other Ambulatory Visit: Payer: Self-pay | Admitting: Internal Medicine

## 2020-04-28 MED ORDER — POTASSIUM CHLORIDE CRYS ER 10 MEQ PO TBCR: 10.0000 meq | EXTENDED_RELEASE_TABLET | Freq: Every day | ORAL | 0 refills | Status: DC

## 2020-05-01 ENCOUNTER — Encounter: Payer: Self-pay | Admitting: Pulmonary Disease

## 2020-05-01 ENCOUNTER — Ambulatory Visit: Payer: BC Managed Care – PPO | Admitting: Pulmonary Disease

## 2020-05-01 ENCOUNTER — Other Ambulatory Visit: Payer: Self-pay

## 2020-05-01 VITALS — BP 120/80 | HR 91 | Temp 97.3°F | Ht 61.0 in | Wt 171.6 lb

## 2020-05-01 DIAGNOSIS — Z87891 Personal history of nicotine dependence: Secondary | ICD-10-CM

## 2020-05-01 DIAGNOSIS — J449 Chronic obstructive pulmonary disease, unspecified: Secondary | ICD-10-CM | POA: Diagnosis not present

## 2020-05-01 DIAGNOSIS — E119 Type 2 diabetes mellitus without complications: Secondary | ICD-10-CM

## 2020-05-01 DIAGNOSIS — J9611 Chronic respiratory failure with hypoxia: Secondary | ICD-10-CM

## 2020-05-01 MED ORDER — TRELEGY ELLIPTA 100-62.5-25 MCG/INH IN AEPB
2.0000 | INHALATION_SPRAY | Freq: Every day | RESPIRATORY_TRACT | 0 refills | Status: DC
Start: 2020-05-01 — End: 2020-11-23

## 2020-05-01 MED ORDER — PREDNISONE 10 MG PO TABS: 5.0000 mg | ORAL_TABLET | Freq: Every day | ORAL | 3 refills | Status: DC

## 2020-05-01 NOTE — Assessment & Plan Note (Signed)
Plan:  Continue follow up with PCP  Continue Trulicity

## 2020-05-01 NOTE — Progress Notes (Signed)
@Patient  ID: [name], female    DOB: May 18, 1956, 64 y.o.   MRN: [id number]  Chief Complaint  Patient presents with  . Follow-up    COPD, no new issues    Referring provider: Biagio Borg, MD  HPI:  64 year old female former smoker followed in our office for chronic GOLD COPD IV  PMH: GERD Smoker/ Smoking History: Former Smoker. Quit in August/2020. 23-pack-year. Maintenance: Trelegy Ellipta, pred 5mg   Pt of: Dr. Elsworth Soho  05/01/2020  - Visit   64 year old female former smoker followed in our office for COPD Gold stage IV. She is presenting to our office is a 77-month follow-up today. She reports that she is doing okay and feels that she is at her baseline. She is status recently completed a vacation with her family at [facility]. She reports that she did struggle at times while on vacation. She specifically reports [place] felt very short of breath and hot. She got dizzy walking back to the car. When she arrived to the car she realized that her oxygen was disconnected and likely had been disconnected for the entire hour that [place]. She feels that she has recovered from this event.  She reports adherence to Trelegy Ellipta. She is on her baseline 5 mg of prednisone daily. She is requesting a new nebulizer today. We will work on this. She occasionally has to use DuoNeb nebulized medications.  Questionaires / Pulmonary Flowsheets:   ACT:  No flowsheet data found.  MMRC: mMRC Dyspnea Scale mMRC Score  05/01/2020 2  01/07/2020 2    [place]:  No flowsheet data found.  Tests:   07/15/2019 - [facility] COV2 - negative   03/07/2017-spirometry- severe airway obstruction FEV1 42  05/03/2019-pulmonary function test- FVC 1.05 (44% predicted), postbronchodilator ratio 47, postbronchodilator FEV1 0.47 (25% predicted), DLCO 54  01/11/18-chest x-ray-improved cardiomegaly and pulmonary vascular congestion, stable mild changes of  COPD  12/09/2016-CT Angio- no radiographic evidence of pulmonary embolism    02/09/2017-echocardiogram LV ejection fraction 60 to 10%, grade 2 diastolic dysfunction  11/27/2534 - CBC with Diff - eos relative 3.6, eos absolute 0.3  FENO:  No results found for: NITRICOXIDE  PFT: PFT Results Latest Ref Rng & Units 05/03/2019  FVC-Pre L 1.05  FVC-Predicted Pre % 44  FVC-Post L 1.00  FVC-Predicted Post % 42  Pre FEV1/FVC % % 49  Post FEV1/FCV % % 47  FEV1-Pre L 0.52  FEV1-Predicted Pre % 28  FEV1-Post L 0.47  DLCO UNC% % 54  DLCO COR %Predicted % 87  TLC L 3.81  TLC % Predicted % 80  RV % Predicted % 136    WALK:  SIX MIN WALK 05/01/2020 03/02/2018  Supplimental Oxygen during Test? (L/min) Yes No  O2 Flow Rate 3 -  Type Pulse -  Tech Comments: - Fast paced walk no SOB noted.    Imaging: No results found.  Lab Results:  CBC    Component Value Date/Time   WBC 10.8 (H) 03/31/2020 1526   RBC 4.65 03/31/2020 1526   HGB 13.6 03/31/2020 1526   HGB 15.7 06/12/2014 1543   HCT 41.5 03/31/2020 1526   HCT 48.3 (H) 06/12/2014 1543   PLT 206.0 03/31/2020 1526   PLT 178 06/12/2014 1543   MCV 89.2 03/31/2020 1526   MCV 91 06/12/2014 1543   MCH 29.0 01/28/2020 0205   MCHC 32.9 03/31/2020 1526   RDW 14.3 03/31/2020  1526   RDW 14.8 (H) 06/12/2014 1543   LYMPHSABS 1.4 03/31/2020 1526   MONOABS 0.4 03/31/2020 1526   EOSABS 0.0 03/31/2020 1526   BASOSABS 0.0 03/31/2020 1526    BMET    Component Value Date/Time   NA 139 03/31/2020 1526   NA 142 06/12/2014 1543   K 4.0 03/31/2020 1526   K 3.6 06/12/2014 1543   CL 95 (L) 03/31/2020 1526   CL 105 06/12/2014 1543   CO2 40 (H) 03/31/2020 1526   CO2 29 06/12/2014 1543   GLUCOSE 143 (H) 03/31/2020 1526   GLUCOSE 113 (H) 06/12/2014 1543   BUN 15 03/31/2020 1526   BUN 12 06/12/2014 1543   CREATININE 0.40 03/31/2020 1526   CREATININE 0.62 06/12/2014 1543   CALCIUM 9.6 03/31/2020 1526   CALCIUM 8.9 06/12/2014 1543   GFRNONAA NOT  CALCULATED 01/28/2020 0205   GFRNONAA >60 06/12/2014 1543   GFRAA NOT CALCULATED 01/28/2020 0205   GFRAA >60 06/12/2014 1543    BNP    Component Value Date/Time   BNP 61.4 01/28/2020 0826    ProBNP    Component Value Date/Time   PROBNP 45.0 03/24/2019 1213    Specialty Problems      Pulmonary Problems   COPD (chronic obstructive pulmonary disease) with chronic bronchitis (HCC)    03/07/2017-spirometry- severe airway obstruction FEV1 42  01/11/18-chest x-ray-improved cardiomegaly and pulmonary vascular congestion, stable mild changes of COPD  05/03/2019-pulmonary function test- FVC 1.05 (44% predicted), postbronchodilator ratio 47, postbronchodilator FEV1 0.47 (25% predicted), DLCO 54       Allergic rhinitis   5 mm Lung nodule, solitary   Nocturnal hypoxemia   Cough   Sinusitis, acute   COPD exacerbation (HCC)   Chronic respiratory failure with hypoxia (HCC)      Allergies  Allergen Reactions  . Augmentin [Amoxicillin-Pot Clavulanate] Nausea And Vomiting    Immunization History  Administered Date(s) Administered  . Influenza Split 07/08/2013  . Influenza,inj,Quad PF,6+ Mos 07/31/2015, 09/03/2017  . PFIZER SARS-COV-2 Vaccination 01/01/2020, 01/25/2020  . Pneumococcal Polysaccharide-23 02/02/2014, 05/03/2019    Past Medical History:  Diagnosis Date  . Bronchitis   . CAD (coronary artery disease)    a. 02/2002: CABG x3 with LIMA to LAD, RIMA to RCA, and SVG to OM  . CHF (congestive heart failure) ([place])   . COPD (chronic obstructive pulmonary disease) ([place])   . Diabetes ([place])   . Headache   . Heart disease   . Hyperlipidemia   . Hypertension   . Impaired glucose tolerance 08/20/2014  . Sinusitis     Tobacco History: Social History   Tobacco Use  Smoking Status Former Smoker  . Packs/day: 0.50  . Years: 47.00  . Pack years: 23.50  . Types: Cigarettes  . Start date: 12/29/1971  . Quit date: 05/22/2019  . Years since quitting: 0.9  Smokeless Tobacco  Never Used   Counseling given: Yes   Continue to not smoke  Outpatient Encounter Medications as of 05/01/2020  Medication Sig  . albuterol (PROVENTIL HFA) 108 (90 Base) MCG/ACT inhaler INHALE 1-2 PUFFS INTO THE LUNGS EVERY 6 (SIX) HOURS AS NEEDED FOR WHEEZING OR SHORTNESS OF BREATH.  Marland Kitchen amLODipine (NORVASC) 5 MG tablet TAKE 1 TABLET BY MOUTH EVERY DAY  . atorvastatin (LIPITOR) 40 MG tablet Take 1 tablet (40 mg total) by mouth daily.  . carvedilol (COREG) 12.5 MG tablet TAKE 1 TABLET BY MOUTH TWICE A DAY  . cholecalciferol (VITAMIN D) 1000 units tablet Take 1,000 Units  by mouth daily.  . Dulaglutide (TRULICITY) 2.87 OM/7.6HM SOPN Inject 0.75 mg into the skin once a week.  . Fluticasone-Umeclidin-Vilant (TRELEGY ELLIPTA) 100-62.5-25 MCG/INH AEPB Inhale 1 puff into the lungs daily.  . furosemide (LASIX) 40 MG tablet Take 1 tablet (40 mg total) by mouth 2 (two) times daily.  Marland Kitchen ipratropium-albuterol (DUONEB) 0.5-2.5 (3) MG/3ML SOLN TAKE 3 MLS BY NEBULIZATION EVERY 6 (SIX) HOURS AS NEEDED.  Marland Kitchen isosorbide mononitrate (IMDUR) 60 MG 24 hr tablet TAKE 1 AND 1/2 TABLETS BY MOUTH DAILY  . metFORMIN (GLUCOPHAGE-XR) 500 MG 24 hr tablet 1 tab by mouth once daily as needed for elevated sugar  . Multiple Vitamin (MULTIVITAMIN WITH MINERALS) TABS tablet Take 1 tablet by mouth daily.  . potassium chloride (KLOR-CON M10) 10 MEQ tablet Take 1 tablet (10 mEq total) by mouth daily.  . predniSONE (DELTASONE) 10 MG tablet Take 0.5 tablets (5 mg total) by mouth daily with breakfast.  . traZODone (DESYREL) 50 MG tablet TAKE 1/2 TO 1 TABLET BY MOUTH AT BEDTIME AS NEEDED FOR SLEEP  . triamcinolone (NASACORT) 55 MCG/ACT AERO nasal inhaler Place 2 sprays into the nose daily.  . vitamin B-12 (CYANOCOBALAMIN) 1000 MCG tablet Take 1,000 mcg by mouth daily.  . vitamin C (ASCORBIC ACID) 500 MG tablet Take 500 mg by mouth daily.   . Vitamin D, Ergocalciferol, (DRISDOL) 1.25 MG (50000 UNIT) CAPS capsule Take 1 capsule (50,000  Units total) by mouth every 7 (seven) days.  . [DISCONTINUED] predniSONE (DELTASONE) 10 MG tablet Take 0.5 tablets (5 mg total) by mouth daily with breakfast.  . [DISCONTINUED] predniSONE (DELTASONE) 10 MG tablet Take 2 tabs daily with food for 5 days, then drop down to 1 tab daily with food for 5 days. Resume 5mg  once daily.  . [DISCONTINUED] predniSONE (DELTASONE) 5 MG tablet TAKE 1 TABLET BY MOUTH EVERY DAY WITH BREAKFAST  . Fluticasone-Umeclidin-Vilant (TRELEGY ELLIPTA) 100-62.5-25 MCG/INH AEPB Inhale 2 puffs into the lungs daily.   No facility-administered encounter medications on file as of 05/01/2020.     Review of Systems  Review of Systems  Constitutional: Positive for fatigue. Negative for activity change and fever.  HENT: Positive for congestion. Negative for sinus pressure, sinus pain and sore throat.   Respiratory: Positive for cough and shortness of breath. Negative for wheezing.   Cardiovascular: Negative for chest pain and palpitations.  Gastrointestinal: Negative for diarrhea, nausea and vomiting.  Musculoskeletal: Negative for arthralgias.  Neurological: Negative for dizziness.  Psychiatric/Behavioral: Negative for sleep disturbance. The patient is not nervous/anxious.      Physical Exam  BP 120/80 (BP Location: Left Arm, Cuff Size: Normal)   Pulse 91   Temp (!) 97.3 F (36.3 C) (Oral)   Ht 5\' 1"  (1.549 m)   Wt 171 lb 9.6 oz (77.8 kg)   SpO2 94%   BMI 32.42 kg/m   Wt Readings from Last 5 Encounters:  05/01/20 171 lb 9.6 oz (77.8 kg)  04/12/20 176 lb (79.8 kg)  03/06/20 176 lb (79.8 kg)  02/01/20 181 lb 6.4 oz (82.3 kg)  01/28/20 180 lb (81.6 kg)    BMI Readings from Last 5 Encounters:  05/01/20 32.42 kg/m  04/12/20 33.25 kg/m  03/06/20 33.25 kg/m  02/01/20 34.28 kg/m  01/28/20 34.01 kg/m     Physical Exam Vitals and nursing note reviewed.  Constitutional:      General: She is not in acute distress.    Appearance: Normal appearance. She is  obese.  HENT:  Head: Normocephalic and atraumatic.     Right Ear: External ear normal.     Left Ear: External ear normal.     Nose: Nose normal. No congestion.     Mouth/Throat:     Mouth: Mucous membranes are moist.     Pharynx: Oropharynx is clear.  Eyes:     Pupils: Pupils are equal, round, and reactive to light.  Cardiovascular:     Rate and Rhythm: Normal rate and regular rhythm.     Pulses: Normal pulses.     Heart sounds: Normal heart sounds. No murmur heard.   Pulmonary:     Effort: Pulmonary effort is normal. No respiratory distress.     Breath sounds: No decreased air movement. No decreased breath sounds, wheezing or rales.     Comments: Diminished breath sounds throughout exam Abdominal:     General: Abdomen is flat. Bowel sounds are normal.     Palpations: Abdomen is soft.  Musculoskeletal:     Cervical back: Normal range of motion.  Skin:    General: Skin is warm and dry.     Capillary Refill: Capillary refill takes less than 2 seconds.  Neurological:     General: No focal deficit present.     Mental Status: She is alert and oriented to person, place, and time. Mental status is at baseline.     Gait: Gait normal.  Psychiatric:        Mood and Affect: Mood normal.        Behavior: Behavior normal.        Thought Content: Thought content normal.        Judgment: Judgment normal.       Assessment & Plan:   Former smoker Congratulations on stopping smoking!  Plan: Continue to use nicotine replacement patches Continue to not smoke  Diabetes ([place]) Plan:  Continue follow up with PCP  Continue Trulicity   COPD (chronic obstructive pulmonary disease) with chronic bronchitis ([place]) Plan: Continue Trelegy Ellipta We will refill prednisone, continue 5 mg prednisone daily Start nasal saline rinses Continue Allegra Continue to not smoke Will order nebulizer today  Follow-up in 3 months  Chronic respiratory failure with hypoxia ([place]) Plan: Continue  oxygen therapy as prescribed Maintain oxygen saturations above 88%, goal oxygen saturations 88 to 92%    Return in about 3 months (around 08/01/2020), or if symptoms worsen or fail to improve, for Follow up with Dr. Elsworth Soho.   Lauraine Rinne, NP 05/01/2020   This appointment required 31 minutes of patient care (this includes precharting, chart review, review of results, face-to-face care, etc.).

## 2020-05-01 NOTE — Assessment & Plan Note (Signed)
Plan: Continue oxygen therapy as prescribed Maintain oxygen saturations above 88%, goal oxygen saturations 88 to 92%

## 2020-05-01 NOTE — Addendum Note (Signed)
Addended byCoralie Keens on: 05/01/2020 04:44 PM   Modules accepted: Orders

## 2020-05-01 NOTE — Patient Instructions (Addendum)
You were seen today by Lauraine Rinne, NP  for:   1. COPD (chronic obstructive pulmonary disease) with chronic bronchitis (HCC)  Walk today   Trelegy Ellipta  >>> 1 puff daily in the morning >>>rinse mouth out after use  >>> This inhaler contains 3 medications that help manage her respiratory status, contact our office if you cannot afford this medication or unable to remain on this medication  Continue 5 mg of prednisone daily  Note your daily symptoms > remember "red flags" for COPD:   >>>Increase in cough >>>increase in sputum production >>>increase in shortness of breath or activity  intolerance.   If you notice these symptoms, please call the office to be seen.    2. Chronic respiratory failure with hypoxia (HCC)  Continue oxygen therapy as prescribed  >>>maintain oxygen saturations greater than 88 percent  >>>if unable to maintain oxygen saturations please contact the office  >>>do not smoke with oxygen  >>>can use nasal saline gel or nasal saline rinses to moisturize nose if oxygen causes dryness   3. Type 2 diabetes mellitus without complication, without long-term current use of insulin (HCC)  Continue Trulicity and follow-up with primary care for management of your blood sugars  4. Former smoker  Continue to not smoke  Follow Up:    Return in about 3 months (around 08/01/2020), or if symptoms worsen or fail to improve, for Follow up with Dr. Elsworth Soho.    Please do your part to reduce the spread of COVID-19:      Reduce your risk of any infection  and COVID19 by using the similar precautions used for avoiding the common cold or flu:  Marland Kitchen Wash your hands often with soap and warm water for at least 20 seconds.  If soap and water are not readily available, use an alcohol-based hand sanitizer with at least 60% alcohol.  . If coughing or sneezing, cover your mouth and nose by coughing or sneezing into the elbow areas of your shirt or coat, into a tissue or into your  sleeve (not your hands). Langley Gauss A MASK when in public  . Avoid shaking hands with others and consider head nods or verbal greetings only. . Avoid touching your eyes, nose, or mouth with unwashed hands.  . Avoid close contact with people who are sick. . Avoid places or events with large numbers of people in one location, like concerts or sporting events. . If you have some symptoms but not all symptoms, continue to monitor at home and seek medical attention if your symptoms worsen. . If you are having a medical emergency, call 911.   [place] / e-Visit: [email or web address]         [facility]: [facility]: [phone]                   [facility]: [phone]     It is flu season:   >>> Best ways to protect herself from the flu: Receive the yearly flu vaccine, practice good hand hygiene washing with soap and also using hand sanitizer when available, eat a nutritious meals, get adequate rest, hydrate appropriately   Please contact the office if your symptoms worsen or you have concerns that you are not improving.   Thank you for choosing [facility] for your healthcare, and for allowing [place] to partner with you on your healthcare journey. I am thankful to  be able to provide care to you today.   Wyn Quaker FNP-C

## 2020-05-01 NOTE — Assessment & Plan Note (Signed)
Congratulations on stopping smoking!  Plan: Continue to use nicotine replacement patches Continue to not smoke

## 2020-05-01 NOTE — Assessment & Plan Note (Addendum)
Plan: Continue Trelegy Ellipta We will refill prednisone, continue 5 mg prednisone daily Start nasal saline rinses Continue Allegra Continue to not smoke Will order nebulizer today  Follow-up in 3 months

## 2020-05-02 DIAGNOSIS — J449 Chronic obstructive pulmonary disease, unspecified: Secondary | ICD-10-CM | POA: Diagnosis not present

## 2020-05-14 DIAGNOSIS — J449 Chronic obstructive pulmonary disease, unspecified: Secondary | ICD-10-CM | POA: Diagnosis not present

## 2020-05-21 ENCOUNTER — Other Ambulatory Visit: Payer: Self-pay | Admitting: Pulmonary Disease

## 2020-06-14 DIAGNOSIS — J449 Chronic obstructive pulmonary disease, unspecified: Secondary | ICD-10-CM | POA: Diagnosis not present

## 2020-06-27 ENCOUNTER — Encounter: Payer: Self-pay | Admitting: Internal Medicine

## 2020-06-27 ENCOUNTER — Other Ambulatory Visit: Payer: Self-pay | Admitting: Internal Medicine

## 2020-06-27 ENCOUNTER — Telehealth: Payer: BC Managed Care – PPO | Admitting: Internal Medicine

## 2020-06-28 ENCOUNTER — Other Ambulatory Visit: Payer: Self-pay | Admitting: Internal Medicine

## 2020-06-28 MED ORDER — LEVOFLOXACIN 500 MG PO TABS: 500.0000 mg | ORAL_TABLET | Freq: Every day | ORAL | 0 refills | Status: AC

## 2020-06-28 MED ORDER — HYDROCODONE-HOMATROPINE 5-1.5 MG/5ML PO SYRP: 5.0000 mL | ORAL_SOLUTION | Freq: Four times a day (QID) | ORAL | 0 refills | Status: AC | PRN

## 2020-06-28 MED ORDER — PREDNISONE 10 MG PO TABS: ORAL_TABLET | ORAL | 0 refills | Status: DC

## 2020-06-28 NOTE — Telephone Encounter (Signed)
D/w pt -   3 days of ST,earache, sweats and increased prod cough with slight wheezing'  Hx of copd with home o2 2L no change  Tx:  Levaquin, coughmed, predpac - done erx   Pt to go to ED for any worsening

## 2020-06-28 NOTE — Telephone Encounter (Signed)
Patient called again, requesting a call back

## 2020-06-28 NOTE — Telephone Encounter (Signed)
    Patient states she never received a call on 9/7. Requesting call today.  No appointments available on schedule for today  Please advise

## 2020-06-28 NOTE — Telephone Encounter (Signed)
I called the patient twice, but only received voice mail, I will try again

## 2020-07-15 DIAGNOSIS — J449 Chronic obstructive pulmonary disease, unspecified: Secondary | ICD-10-CM | POA: Diagnosis not present

## 2020-07-20 ENCOUNTER — Other Ambulatory Visit: Payer: Self-pay | Admitting: Pulmonary Disease

## 2020-08-14 DIAGNOSIS — J449 Chronic obstructive pulmonary disease, unspecified: Secondary | ICD-10-CM | POA: Diagnosis not present

## 2020-08-15 ENCOUNTER — Other Ambulatory Visit: Payer: Self-pay | Admitting: Internal Medicine

## 2020-08-15 DIAGNOSIS — I25119 Atherosclerotic heart disease of native coronary artery with unspecified angina pectoris: Secondary | ICD-10-CM

## 2020-08-15 NOTE — Telephone Encounter (Signed)
Please refill as per office routine med refill policy (all routine meds refilled for 3 mo or monthly per pt preference up to one year from last visit, then month to month grace period for 3 mo, then further med refills will have to be denied)  

## 2020-09-14 DIAGNOSIS — J449 Chronic obstructive pulmonary disease, unspecified: Secondary | ICD-10-CM | POA: Diagnosis not present

## 2020-09-20 ENCOUNTER — Telehealth: Payer: Self-pay | Admitting: Pulmonary Disease

## 2020-09-20 MED ORDER — TRELEGY ELLIPTA 100-62.5-25 MCG/INH IN AEPB: INHALATION_SPRAY | RESPIRATORY_TRACT | 1 refills | Status: DC

## 2020-09-20 NOTE — Telephone Encounter (Signed)
Spoke with the pt She is overdue f/u  Made her an appt with RA and refilled trelegy to last until then  Nothing further needed

## 2020-09-22 ENCOUNTER — Other Ambulatory Visit: Payer: Self-pay | Admitting: Internal Medicine

## 2020-09-22 DIAGNOSIS — I25119 Atherosclerotic heart disease of native coronary artery with unspecified angina pectoris: Secondary | ICD-10-CM

## 2020-09-22 NOTE — Telephone Encounter (Signed)
Please refill as per office routine med refill policy (all routine meds refilled for 3 mo or monthly per pt preference up to one year from last visit, then month to month grace period for 3 mo, then further med refills will have to be denied)  

## 2020-10-13 ENCOUNTER — Encounter: Payer: Self-pay | Admitting: Internal Medicine

## 2020-10-14 DIAGNOSIS — J449 Chronic obstructive pulmonary disease, unspecified: Secondary | ICD-10-CM | POA: Diagnosis not present

## 2020-10-15 IMAGING — DX DG CHEST 1V PORT
1 series · 1 of 1 positions shown · non-contrast
Comparison: 09/02/2019

CLINICAL DATA: Shortness of breath

EXAM:
PORTABLE CHEST 1 VIEW

[chest ap]
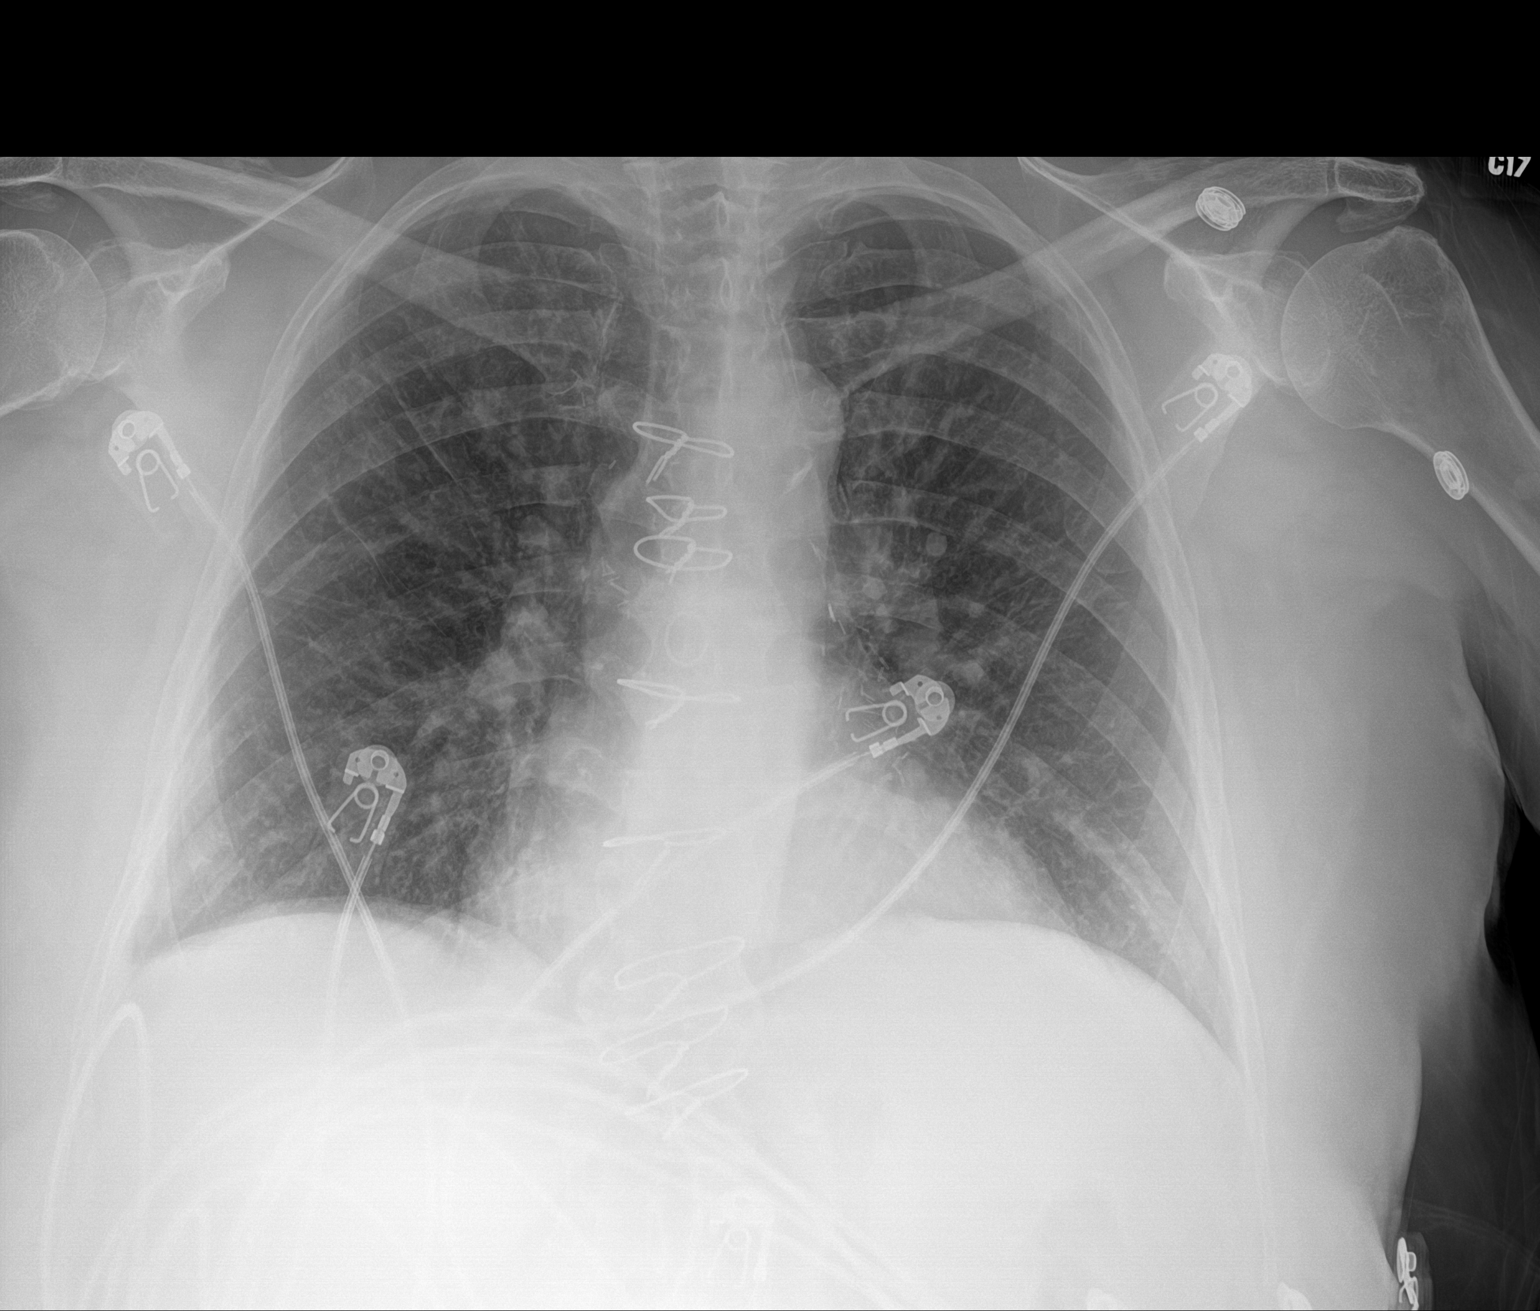

[1 of 1 positions shown; findings below may reference images not displayed]

FINDINGS: Post CABG changes. The heart size and mediastinal contours are
within normal limits. Chronic interstitial prominence, similar to
prior. No focal airspace consolidation, pleural effusion, or
pneumothorax. The visualized skeletal structures are unremarkable.
IMPRESSION: Persistent prominence of the pulmonary interstitium which could
reflect chronic bronchitic-type lung changes or a component of
interstitial edema. Findings are similar to prior studies.

## 2020-10-18 IMAGING — CR DG CHEST 2V
2 series · 2 of 2 positions shown · non-contrast
Comparison: January 26, 2020

CLINICAL DATA: Shortness of breath and chest tightness.

EXAM:
CHEST - 2 VIEW

[chest lat]
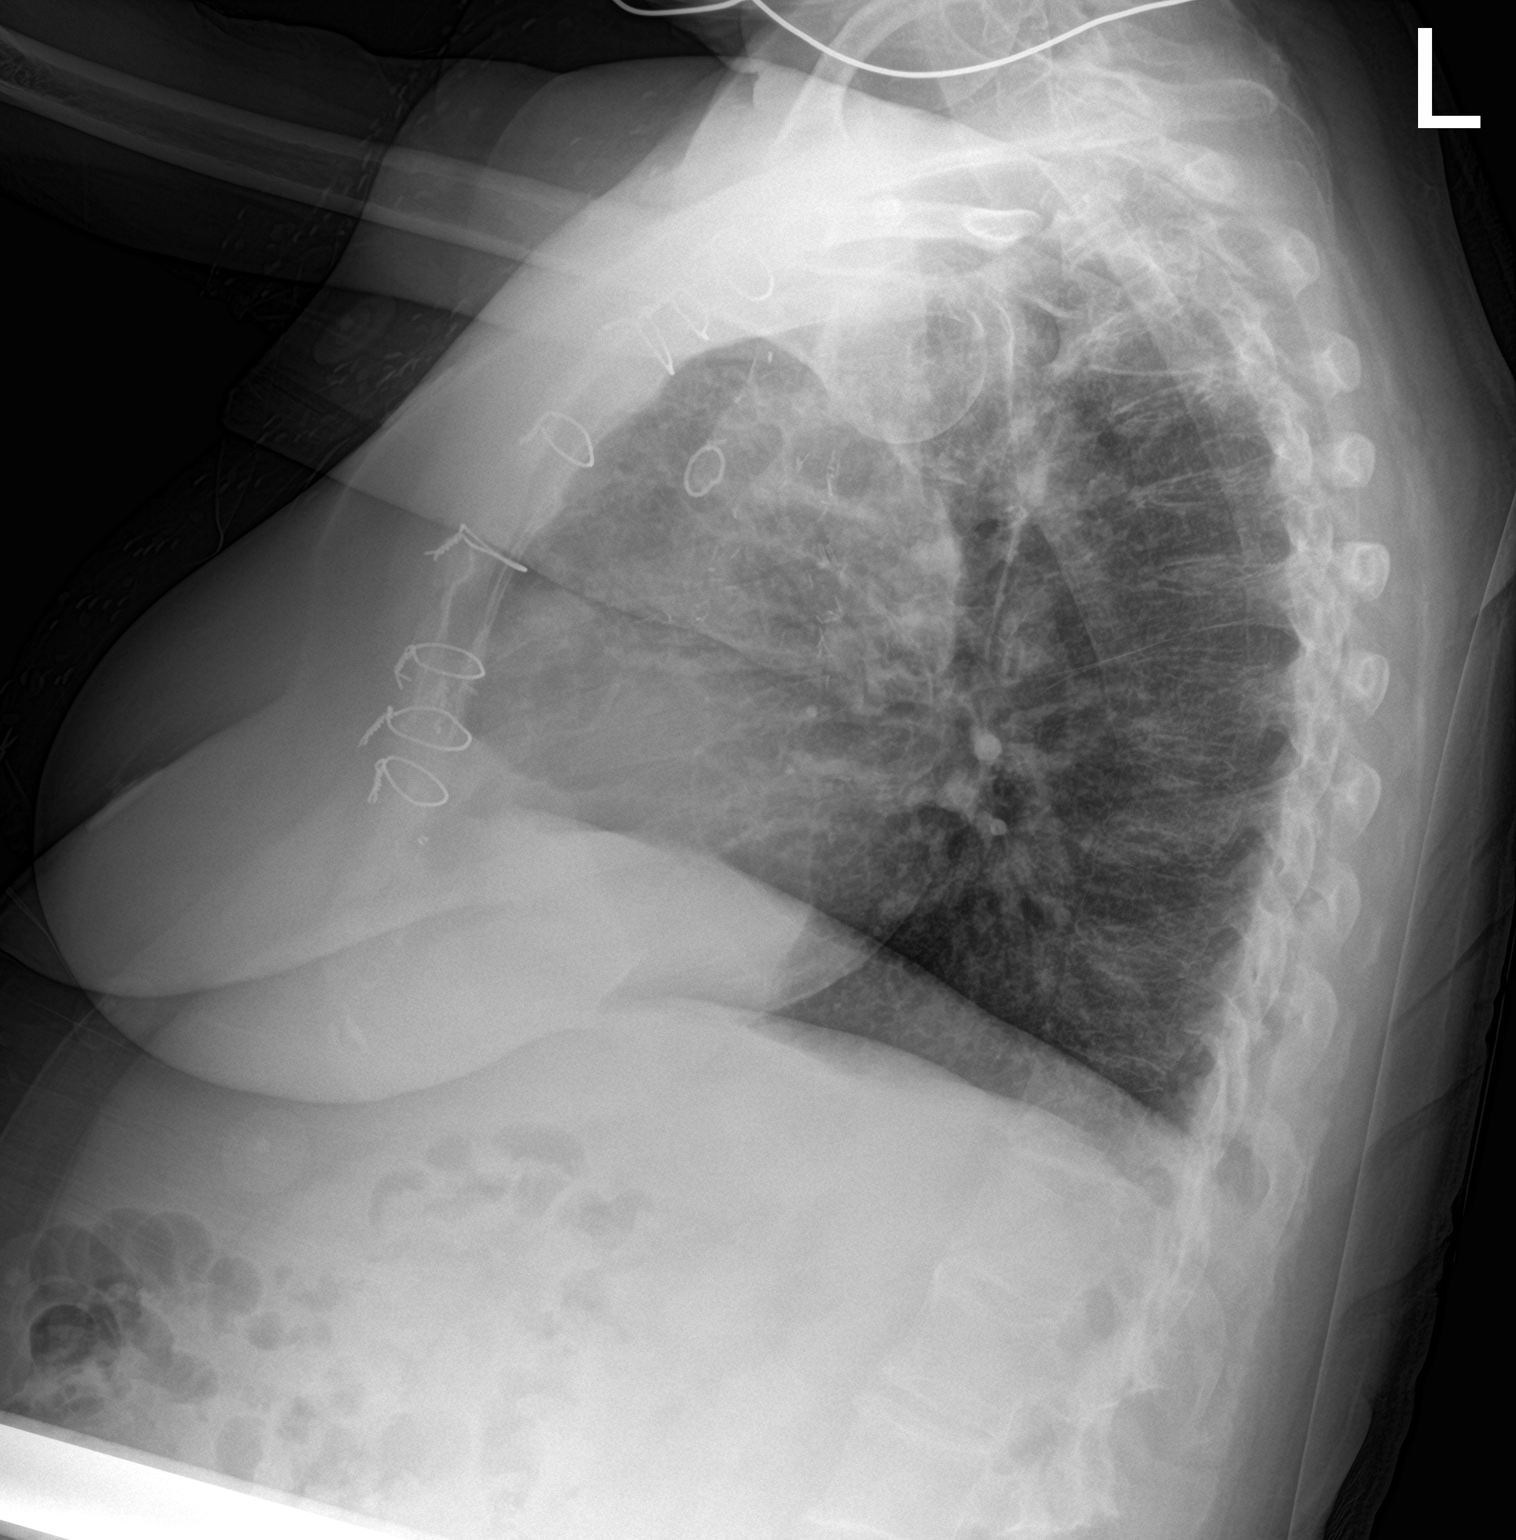

[chest ap]
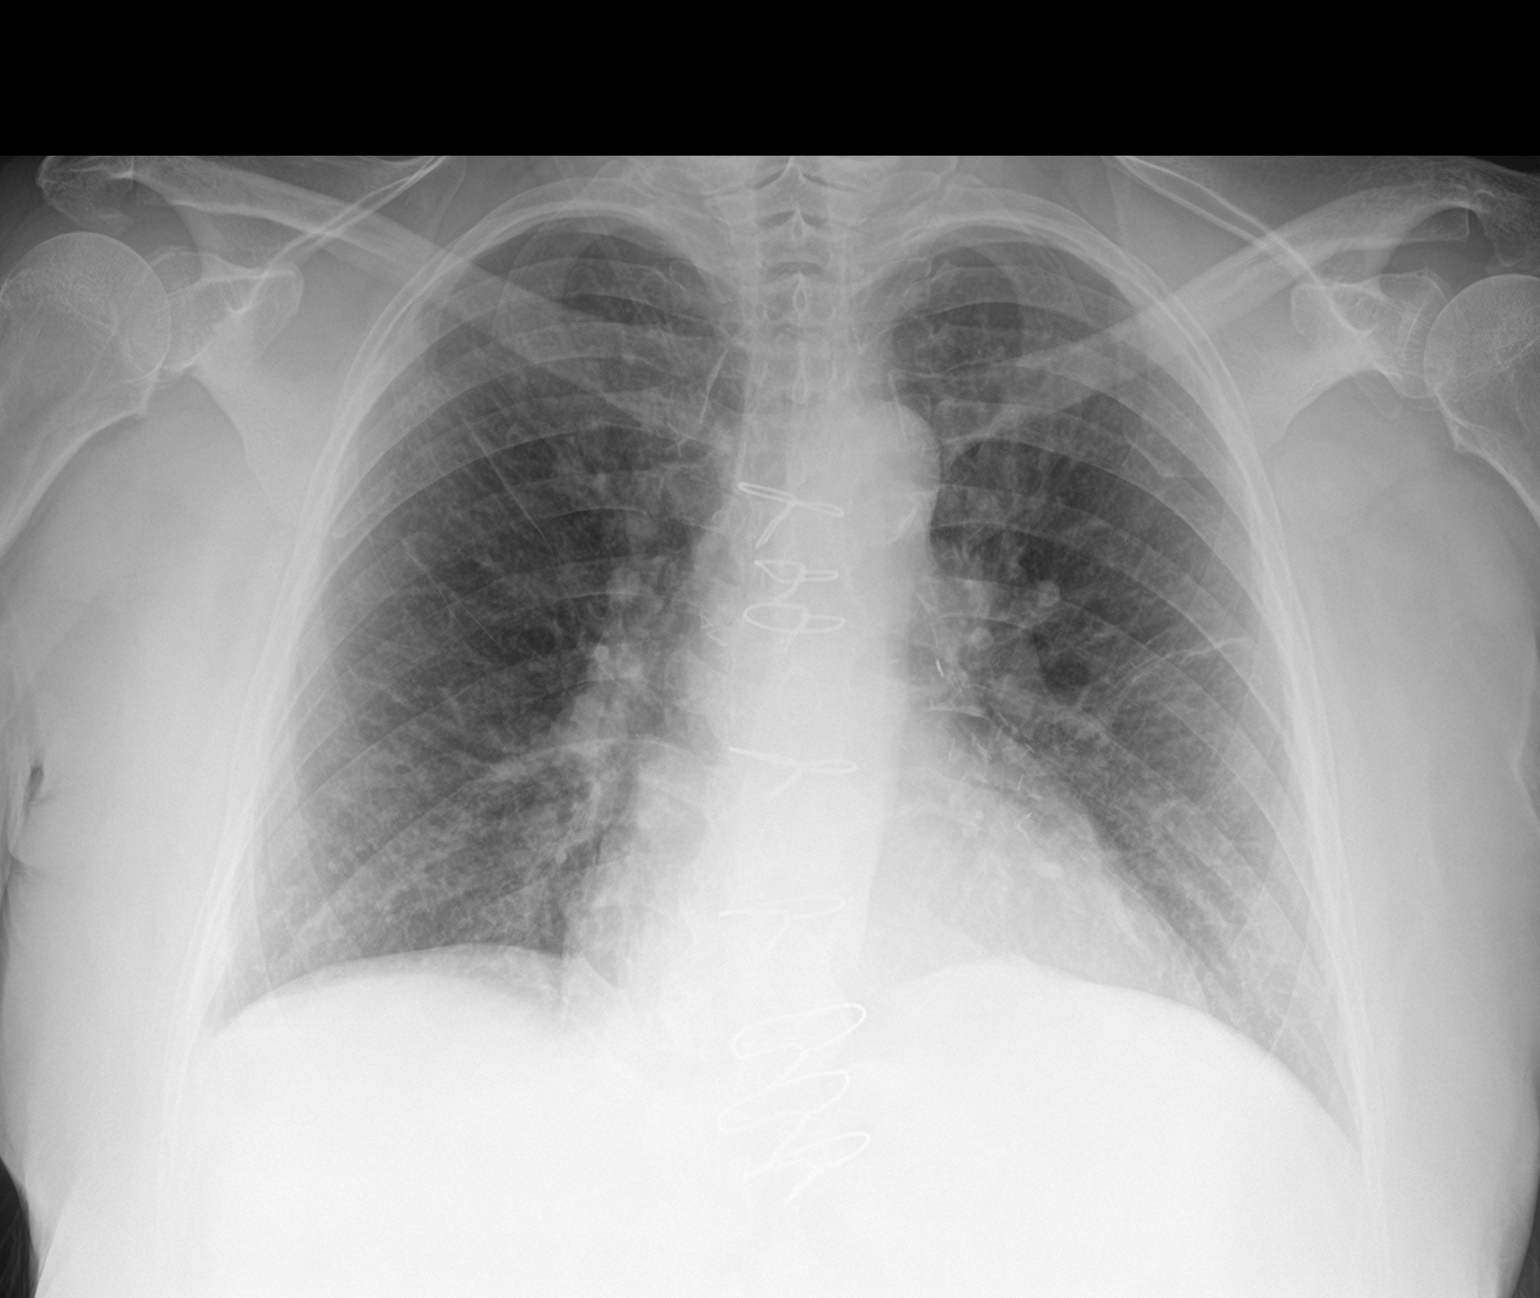

[2 of 2 positions shown; findings below may reference images not displayed]

FINDINGS: Multiple sternal wires and vascular clips are seen.

Mild, stable chronic appearing increased lung markings are seen.

Mild linear atelectasis is seen within the mid left lung.

There is no evidence of a pleural effusion or pneumothorax.

The heart size and mediastinal contours are within normal limits.

Mild calcification of the aortic arch is noted.

Degenerative changes seen throughout the thoracic spine.
IMPRESSION: 1. Evidence of prior median sternotomy/CABG.
2. Chronic appearing increased lung markings without evidence of
acute or active cardiopulmonary disease.

## 2020-10-19 ENCOUNTER — Ambulatory Visit
Admission: EM | Admit: 2020-10-19 | Discharge: 2020-10-19 | Disposition: A | Discharge status: Home / Self Care | Payer: BC Managed Care – PPO | Attending: Emergency Medicine | Admitting: Emergency Medicine

## 2020-10-19 ENCOUNTER — Other Ambulatory Visit: Payer: Self-pay

## 2020-10-19 DIAGNOSIS — J019 Acute sinusitis, unspecified: Secondary | ICD-10-CM | POA: Diagnosis not present

## 2020-10-19 DIAGNOSIS — J441 Chronic obstructive pulmonary disease with (acute) exacerbation: Secondary | ICD-10-CM

## 2020-10-19 MED ORDER — BENZONATATE 200 MG PO CAPS: 200.0000 mg | ORAL_CAPSULE | Freq: Three times a day (TID) | ORAL | 0 refills | Status: AC | PRN

## 2020-10-19 MED ORDER — PREDNISONE 10 MG PO TABS: ORAL_TABLET | ORAL | 0 refills | Status: DC

## 2020-10-19 MED ORDER — ALBUTEROL SULFATE HFA 108 (90 BASE) MCG/ACT IN AERS: 1.0000 | INHALATION_SPRAY | Freq: Four times a day (QID) | RESPIRATORY_TRACT | 0 refills | Status: DC | PRN

## 2020-10-19 MED ORDER — DOXYCYCLINE HYCLATE 100 MG PO CAPS: 100.0000 mg | ORAL_CAPSULE | Freq: Two times a day (BID) | ORAL | 0 refills | Status: AC

## 2020-10-19 NOTE — ED Provider Notes (Signed)
[name]    CSN: [place]:[id number] Arrival date & time: 10/19/20  [id number]      History   Chief Complaint Chief Complaint  Patient presents with  . Sore Throat  . Ear Fullness  . Headache    HPI PLEASANT [name] is a 64 y.o. female history of CAD, CHF, COPD, DM type II, presenting today for evaluation of fatigue headache sore throat and ear fullness. Symptoms have been going on for approximately 1 week.  Has had some wheezing, but main complaint is ear fullness and pressure.  Denies any known exposure, but does report being around many family members around the holidays.  On 3 L O2 at baseline, has not needed an increase in this.  HPI  Past Medical History:  Diagnosis Date  . Bronchitis   . CAD (coronary artery disease)    a. 02/2002: CABG x3 with LIMA to LAD, RIMA to RCA, and SVG to OM  . CHF (congestive heart failure) ([place])   . COPD (chronic obstructive pulmonary disease) ([place])   . Diabetes ([place])   . Headache   . Heart disease   . Hyperlipidemia   . Hypertension   . Impaired glucose tolerance 08/20/2014  . Sinusitis     Patient Active Problem List   Diagnosis Date Noted  . Vitamin D deficiency 01/03/2020  . HLD (hyperlipidemia) 01/03/2020  . Acute pancreatitis 09/09/2019  . Epigastric pain 09/06/2019  . Back pain 09/06/2019  . Eustachian tube dysfunction 05/18/2019  . Insomnia 05/18/2019  . Chronic respiratory failure with hypoxia ([place]) 12/14/2018  . COPD exacerbation ([place]) 09/03/2017  . Former smoker 03/07/2017  . Sinusitis, acute 04/08/2016  . Left lumbar radiculopathy 10/19/2015  . Cough 08/29/2015  . Pedal edema 02/22/2015  . Diabetes ([place]) 08/20/2014  . Pruritus 07/08/2014  . Nocturnal hypoxemia 07/05/2014  . Esophagus, foreign body 06/12/2014  . Polycythemia, secondary 02/16/2014  . 5 mm Lung nodule, solitary 02/02/2014  . GERD (gastroesophageal reflux disease) 02/01/2014  . Hypersomnolence 02/01/2014  . Allergic rhinitis 09/03/2013  . CAD  (coronary artery disease) 09/03/2013  . Encounter for well adult exam with abnormal findings 09/03/2013  . Essential hypertension 07/07/2013  . COPD (chronic obstructive pulmonary disease) with chronic bronchitis ([place]) 07/07/2013  . Status post coronary artery bypass grafting 07/07/2013  . Headache 07/01/2013  . Hypertensive emergency 07/01/2013  . Polycythemia 07/01/2013    Past Surgical History:  Procedure Laterality Date  . CORONARY ARTERY BYPASS GRAFT  2003   Triple bypass  . ESOPHAGOGASTRODUODENOSCOPY Left 06/13/2014   Procedure: ESOPHAGOGASTRODUODENOSCOPY (EGD);  Surgeon: Juanita Craver, MD;  Location: [facility] ENDOSCOPY;  Service: Endoscopy;  Laterality: Left;  . RIGID ESOPHAGOSCOPY N/A 06/12/2014   Procedure: RIGID ESOPHAGOSCOPY WITH FOREIGN BODY REMOVAL;  Surgeon: Ascencion Dike, MD;  Location: [facility];  Service: ENT;  Laterality: N/A;    OB History   No obstetric history on file.      Home Medications    Prior to Admission medications   Medication Sig Start Date End Date Taking? Authorizing Provider  albuterol (VENTOLIN HFA) 108 (90 Base) MCG/ACT inhaler Inhale 1-2 puffs into the lungs every 6 (six) hours as needed for wheezing or shortness of breath. 10/19/20  Yes Kamron Vanwyhe C, PA-C  benzonatate (TESSALON) 200 MG capsule Take 1 capsule (200 mg total) by mouth 3 (three) times daily as needed for up to 7 days for cough. 10/19/20 10/26/20 Yes Carmel Garfield C, PA-C  doxycycline (VIBRAMYCIN) 100 MG capsule Take 1 capsule (100  mg total) by mouth 2 (two) times daily for 7 days. 10/19/20 10/26/20 Yes Cleone Hulick C, PA-C  predniSONE (DELTASONE) 10 MG tablet Begin with 6 tabs on day 1, 5 tab on day 2, 4 tab on day 3, 3 tab on day 4, 2 tab on day 5, 1 tab on day 6-take with food 10/19/20  Yes Geniyah Eischeid C, PA-C  amLODipine (NORVASC) 5 MG tablet TAKE 1 TABLET BY MOUTH EVERY DAY 03/07/20   Jerline Pain, MD  atorvastatin (LIPITOR) 40 MG tablet Take 1 tablet (40 mg total) by mouth daily.  01/03/20   Biagio Borg, MD  carvedilol (COREG) 12.5 MG tablet TAKE 1 TABLET BY MOUTH TWICE A DAY 03/13/20   Jerline Pain, MD  cholecalciferol (VITAMIN D) 1000 units tablet Take 1,000 Units by mouth daily.    [provider]  Dulaglutide (TRULICITY) A999333 0000000 SOPN Inject 0.75 mg into the skin once a week. 03/06/20   Biagio Borg, MD  Fluticasone-Umeclidin-Vilant (TRELEGY ELLIPTA) 100-62.5-25 MCG/INH AEPB Inhale 2 puffs into the lungs daily. 05/01/20   Lauraine Rinne, NP  Fluticasone-Umeclidin-Vilant (TRELEGY ELLIPTA) 100-62.5-25 MCG/INH AEPB TAKE 1 PUFF BY MOUTH EVERY DAY 09/20/20   Rigoberto Noel, MD  furosemide (LASIX) 40 MG tablet TAKE 1 TABLET BY MOUTH TWICE A DAY 09/22/20   Biagio Borg, MD  ipratropium-albuterol (DUONEB) 0.5-2.5 (3) MG/3ML SOLN TAKE 3 MLS BY NEBULIZATION EVERY 6 (SIX) HOURS AS NEEDED. 07/23/19   Biagio Borg, MD  isosorbide mononitrate (IMDUR) 60 MG 24 hr tablet TAKE 1 AND 1/2 TABLETS BY MOUTH DAILY 04/25/20   Jerline Pain, MD  metFORMIN (GLUCOPHAGE-XR) 500 MG 24 hr tablet 1 tab by mouth once daily as needed for elevated sugar 04/12/20   Biagio Borg, MD  Multiple Vitamin (MULTIVITAMIN WITH MINERALS) TABS tablet Take 1 tablet by mouth daily.    [provider]  potassium chloride (KLOR-CON M10) 10 MEQ tablet Take 1 tablet (10 mEq total) by mouth daily. 04/28/20   Biagio Borg, MD  traZODone (DESYREL) 50 MG tablet TAKE 1/2 TO 1 TABLET BY MOUTH AT BEDTIME AS NEEDED FOR SLEEP 04/27/20   Biagio Borg, MD  triamcinolone (NASACORT) 55 MCG/ACT AERO nasal inhaler Place 2 sprays into the nose daily. 07/14/19   Biagio Borg, MD  vitamin B-12 (CYANOCOBALAMIN) 1000 MCG tablet Take 1,000 mcg by mouth daily.    [provider]  vitamin C (ASCORBIC ACID) 500 MG tablet Take 500 mg by mouth daily.     [provider]  Vitamin D, Ergocalciferol, (DRISDOL) 1.25 MG (50000 UNIT) CAPS capsule Take 1 capsule (50,000 Units total) by mouth every 7 (seven) days.  04/05/20   Biagio Borg, MD    Family History Family History  Problem Relation Age of Onset  . Lung cancer Paternal Uncle   . Heart disease Mother   . Brain cancer Mother   . Cancer Maternal Grandmother        colon  . Stroke Other   . Cancer Other        x 4 aunts  . Lung cancer Maternal Aunt   . Suicidality Brother     Social History Social History   Tobacco Use  . Smoking status: Former Smoker    Packs/day: 0.50    Years: 47.00    Pack years: 23.50    Types: Cigarettes    Start date: 12/29/1971    Quit date: 05/22/2019  Years since quitting: 1.4  . Smokeless tobacco: Never Used  Substance Use Topics  . Alcohol use: Yes    Alcohol/week: 5.0 - 6.0 standard drinks    Types: 5 - 6 Glasses of wine per week  . Drug use: Not Currently    Types: Marijuana    Comment: as a teenager     Allergies   Augmentin [amoxicillin-pot clavulanate]   Review of Systems Review of Systems  Constitutional: Positive for fatigue. Negative for activity change, appetite change, chills and fever.  HENT: Positive for congestion, ear pain, rhinorrhea, sinus pressure and sore throat. Negative for trouble swallowing.   Eyes: Negative for discharge and redness.  Respiratory: Positive for cough and wheezing. Negative for chest tightness and shortness of breath.   Cardiovascular: Negative for chest pain.  Gastrointestinal: Negative for abdominal pain, diarrhea, nausea and vomiting.  Musculoskeletal: Negative for myalgias.  Skin: Negative for rash.  Neurological: Positive for headaches. Negative for dizziness and light-headedness.     Physical Exam Triage Vital Signs ED Triage Vitals  Enc Vitals Group     BP 10/19/20 0906 112/69     Pulse Rate 10/19/20 0906 84     Resp 10/19/20 0906 17     Temp 10/19/20 0906 98.1 F (36.7 C)     Temp Source 10/19/20 0906 Oral     SpO2 10/19/20 0906 98 %     Weight --      Height --      Head Circumference --      Peak Flow --      Pain Score  10/19/20 0905 5     Pain Loc --      Pain Edu? --      Excl. in [place]? --    No data found.  Updated Vital Signs BP 112/69 (BP Location: Left Arm)   Pulse 84   Temp 98.1 F (36.7 C) (Oral)   Resp 17   SpO2 98%   Visual Acuity Right Eye Distance:   Left Eye Distance:   Bilateral Distance:    Right Eye Near:   Left Eye Near:    Bilateral Near:     Physical Exam Vitals and nursing note reviewed.  Constitutional:      Appearance: She is well-developed and well-nourished.     Comments: No acute distress  HENT:     Head: Normocephalic and atraumatic.     Ears:     Comments: Bilateral ears without tenderness to palpation of external auricle, tragus and mastoid, EAC's without erythema or swelling, TM's with good bony landmarks and cone of light. Non erythematous.     Nose: Nose normal.     Mouth/Throat:     Comments: Oral mucosa pink and moist, no tonsillar enlargement or exudate. Posterior pharynx patent and nonerythematous, no uvula deviation or swelling. Normal phonation. Eyes:     Conjunctiva/sclera: Conjunctivae normal.  Cardiovascular:     Rate and Rhythm: Normal rate and regular rhythm.  Pulmonary:     Effort: Pulmonary effort is normal. No respiratory distress.     Comments: Breathing comfortably at rest, mild expiratory wheezing noted throughout bilateral lung fields Abdominal:     General: There is no distension.  Musculoskeletal:        General: Normal range of motion.     Cervical back: Neck supple.  Skin:    General: Skin is warm and dry.  Neurological:     Mental Status: She is alert and oriented to person, place,  and time.  Psychiatric:        Mood and Affect: Mood and affect normal.      UC Treatments / Results  Labs (all labs ordered are listed, but only abnormal results are displayed) Labs Reviewed  NOVEL CORONAVIRUS, NAA    EKG   Radiology No results found.  Procedures Procedures (including critical care time)  Medications Ordered in  UC Medications - No data to display  Initial Impression / Assessment and Plan / UC Course  I have reviewed the triage vital signs and the nursing notes.  Pertinent labs & imaging results that were available during my care of the patient were reviewed by me and considered in my medical decision making (see chart for details).     Treating for sinusitis/COPD exacerbation given length of symptoms, wheezing and persistent pressure, providing prednisone taper x6 days, reports sugars recently 150, advised to monitor closely.  Doxycycline, albuterol and Tessalon for cough.  Covid test pending for screening.  Discussed strict return precautions. Patient verbalized understanding and is agreeable with plan.  Final Clinical Impressions(s) / UC Diagnoses   Final diagnoses:  Acute sinusitis with symptoms > 10 days  COPD exacerbation (HCC)     Discharge Instructions     Covid test pending, we will only call if positive Begin doxycycline twice daily for 1 week Prednisone taper over the next 6 days-begin with 6 tablets, decrease by 1 tablet each day until complete-6, 5, 4, 3, 2, 1-take with food and in the morning if you are able Albuterol inhaler as needed for shortness of breath and wheezing Tessalon for cough Rest and fluids Follow-up if not improving or worsening    ED Prescriptions    Medication Sig Dispense Auth. Provider   predniSONE (DELTASONE) 10 MG tablet Begin with 6 tabs on day 1, 5 tab on day 2, 4 tab on day 3, 3 tab on day 4, 2 tab on day 5, 1 tab on day 6-take with food 21 tablet Jahnay Lantier C, PA-C   albuterol (VENTOLIN HFA) 108 (90 Base) MCG/ACT inhaler Inhale 1-2 puffs into the lungs every 6 (six) hours as needed for wheezing or shortness of breath. 18 g [place] C, PA-C   doxycycline (VIBRAMYCIN) 100 MG capsule Take 1 capsule (100 mg total) by mouth 2 (two) times daily for 7 days. 14 capsule Raelin Pixler C, PA-C   benzonatate (TESSALON) 200 MG capsule Take 1  capsule (200 mg total) by mouth 3 (three) times daily as needed for up to 7 days for cough. 28 capsule Margi Edmundson, [place] C, PA-C     PDMP not reviewed this encounter.   Lew Dawes, [place] 10/19/20 [phone]

## 2020-10-19 NOTE — Discharge Instructions (Addendum)
Covid test pending, we will only call if positive Begin doxycycline twice daily for 1 week Prednisone taper over the next 6 days-begin with 6 tablets, decrease by 1 tablet each day until complete-6, 5, 4, 3, 2, 1-take with food and in the morning if you are able Albuterol inhaler as needed for shortness of breath and wheezing Tessalon for cough Rest and fluids Follow-up if not improving or worsening

## 2020-10-19 NOTE — ED Triage Notes (Signed)
Pt presents with fatigue,  headache, sore throat, bilateral ear fullness and ear pain X 1 week.

## 2020-10-21 LAB — SARS-COV-2, NAA 2 DAY TAT

## 2020-10-21 LAB — NOVEL CORONAVIRUS, NAA: SARS-CoV-2, NAA: NOT DETECTED

## 2020-10-25 ENCOUNTER — Ambulatory Visit: Payer: BC Managed Care – PPO | Admitting: Pulmonary Disease

## 2020-11-06 ENCOUNTER — Other Ambulatory Visit: Payer: Self-pay | Admitting: Internal Medicine

## 2020-11-08 ENCOUNTER — Other Ambulatory Visit: Payer: Self-pay | Admitting: Internal Medicine

## 2020-11-08 NOTE — Telephone Encounter (Signed)
Please refill as per office routine med refill policy (all routine meds refilled for 3 mo or monthly per pt preference up to one year from last visit, then month to month grace period for 3 mo, then further med refills will have to be denied)  

## 2020-11-22 ENCOUNTER — Other Ambulatory Visit: Payer: Self-pay | Admitting: Pulmonary Disease

## 2020-11-23 ENCOUNTER — Other Ambulatory Visit: Payer: Self-pay

## 2020-11-23 ENCOUNTER — Encounter: Payer: Self-pay | Admitting: Pulmonary Disease

## 2020-11-23 ENCOUNTER — Ambulatory Visit: Payer: Medicare HMO | Admitting: Pulmonary Disease

## 2020-11-23 DIAGNOSIS — J449 Chronic obstructive pulmonary disease, unspecified: Secondary | ICD-10-CM | POA: Diagnosis not present

## 2020-11-23 DIAGNOSIS — J9611 Chronic respiratory failure with hypoxia: Secondary | ICD-10-CM | POA: Diagnosis not present

## 2020-11-23 DIAGNOSIS — J398 Other specified diseases of upper respiratory tract: Secondary | ICD-10-CM | POA: Diagnosis not present

## 2020-11-23 MED ORDER — PREDNISONE 10 MG (21) PO TBPK: ORAL_TABLET | Freq: Every day | ORAL | 0 refills | Status: DC

## 2020-11-23 MED ORDER — TRELEGY ELLIPTA 100-62.5-25 MCG/INH IN AEPB: 1.0000 | INHALATION_SPRAY | Freq: Every day | RESPIRATORY_TRACT | 0 refills | Status: DC

## 2020-11-23 NOTE — Assessment & Plan Note (Signed)
Noted on CT 01/2020 -do not think this is contributing to her exacerbations

## 2020-11-23 NOTE — Progress Notes (Signed)
   Subjective:    Patient ID: [name], female    DOB: [birth year], 65 y.o.   MRN: [id number]  HPI  65 yo smokerfor FU of COPD, quit 11/2017 and mild tracheomalacia Last exacerbation was 01/2017 requiring hospitalization,discharged on 02  She finally retired in 2020.  I started her on Trelegy in 2020 and this seems to have worked better for her.  She still has exacerbations every few months.  Last such exacerbation was 09/2020 went to urgent care and was given prednisone taper and an antibiotic.  She wonders if she can have some prednisone on hand to use in case of a flareup and if she is unable to reach the office.  She now uses oxygen 24 hours , does not get outside much Immunizations are up-to-date    Significant tests/ events reviewed  CT angiogram chest 01/2020-emphysema, mild tracheomalacia  04/2019-pulmonary function test- FVC 1.05 (44% predicted), postbronchodilator ratio 47, postbronchodilator FEV1 0.47 (25% predicted), DLCO 54  Spirometry5/2018 >>ratio of 59, FEV1 of 42% FVC of 56% ABG 01/2014 was 7.29/66/75/92% CT angiogram 11/2016 was negative for pulmonary embolism  08/2014 >>PSG neg for OSA , Desaturation as low as 85%  Review of Systems neg for any significant sore throat, dysphagia, itching, sneezing, nasal congestion or excess/ purulent secretions, fever, chills, sweats, unintended wt loss, pleuritic or exertional cp, hempoptysis, orthopnea pnd or change in chronic leg swelling. Also denies presyncope, palpitations, heartburn, abdominal pain, nausea, vomiting, diarrhea or change in bowel or urinary habits, dysuria,hematuria, rash, arthralgias, visual complaints, headache, numbness weakness or ataxia.     Objective:   Physical Exam  Gen. Pleasant, well-nourished, in no distress, on O2, anxious affect ENT - no thrush, no pallor/icterus,no post nasal drip Neck: No JVD, no thyromegaly, no carotid bruits Lungs: no use of accessory muscles, no dullness to  percussion, decreased BL without rales or rhonchi  Cardiovascular: Rhythm regular, heart sounds  normal, no murmurs or gallops, no peripheral edema Musculoskeletal: No deformities, no cyanosis or clubbing        Assessment & Plan:

## 2020-11-23 NOTE — Patient Instructions (Signed)
  Sample of Trelegy. Prescription for prednisone will be kept pending in your pharmacy  Prednisone 10 mg tabs Take 4 tabs  daily with food x 4 days, then 3 tabs daily x 4 days, then 2 tabs daily x 4 days, then 1 tab daily x4 days then stop. #40   Referral to pulmonary rehab

## 2020-11-23 NOTE — Assessment & Plan Note (Signed)
Continue oxygen use at all times. She does not seem to need NIV at this time but she does have some degree of tracheomalacia and we could consider this in the future especially if she has frequent exacerbations

## 2020-11-23 NOTE — Assessment & Plan Note (Signed)
Sample of Trelegy. Prescription for prednisone will be kept pending in your pharmacy, to use when she has a flareup  Prednisone 10 mg tabs Take 4 tabs  daily with food x 4 days, then 3 tabs daily x 4 days, then 2 tabs daily x 4 days, then 1 tab daily x4 days then stop. #40   Referral to pulmonary rehab

## 2020-11-24 ENCOUNTER — Encounter (HOSPITAL_COMMUNITY): Payer: Self-pay | Admitting: *Deleted

## 2020-11-24 NOTE — Progress Notes (Signed)
Received referral from Dr. Elsworth Soho for this pt to participate in pulmonary rehab with the the diagnosis of COPD with chronic bronchitis . Pt most recent PFT in 2020 show FEV1/FVC 49 and post pred 25 Clinical review of pt follow up appt on 11/23/20  Pulmonary office note.  Also reviewed pt most recent follow up with her PCP - Dr. Jenny Reichmann.  Pt with Covid Risk Score - 6. Pt appropriate for scheduling for Pulmonary rehab.  Will forward to support staff for scheduling and verification of insurance eligibility/benefits with pt consent. Cherre Huger, BSN Cardiac and Training and development officer

## 2020-12-13 ENCOUNTER — Other Ambulatory Visit: Payer: Self-pay | Admitting: Internal Medicine

## 2020-12-13 DIAGNOSIS — H0288B Meibomian gland dysfunction left eye, upper and lower eyelids: Secondary | ICD-10-CM | POA: Diagnosis not present

## 2020-12-13 DIAGNOSIS — H0102A Squamous blepharitis right eye, upper and lower eyelids: Secondary | ICD-10-CM | POA: Diagnosis not present

## 2020-12-13 DIAGNOSIS — H0102B Squamous blepharitis left eye, upper and lower eyelids: Secondary | ICD-10-CM | POA: Diagnosis not present

## 2020-12-13 DIAGNOSIS — Z961 Presence of intraocular lens: Secondary | ICD-10-CM | POA: Diagnosis not present

## 2020-12-13 DIAGNOSIS — H0288A Meibomian gland dysfunction right eye, upper and lower eyelids: Secondary | ICD-10-CM | POA: Diagnosis not present

## 2020-12-13 DIAGNOSIS — E119 Type 2 diabetes mellitus without complications: Secondary | ICD-10-CM | POA: Diagnosis not present

## 2020-12-13 DIAGNOSIS — H40013 Open angle with borderline findings, low risk, bilateral: Secondary | ICD-10-CM | POA: Diagnosis not present

## 2020-12-13 DIAGNOSIS — H538 Other visual disturbances: Secondary | ICD-10-CM | POA: Diagnosis not present

## 2020-12-13 DIAGNOSIS — H04123 Dry eye syndrome of bilateral lacrimal glands: Secondary | ICD-10-CM | POA: Diagnosis not present

## 2020-12-13 DIAGNOSIS — H26493 Other secondary cataract, bilateral: Secondary | ICD-10-CM | POA: Diagnosis not present

## 2020-12-13 NOTE — Telephone Encounter (Signed)
Please refill as per office routine med refill policy (all routine meds refilled for 3 mo or monthly per pt preference up to one year from last visit, then month to month grace period for 3 mo, then further med refills will have to be denied)  

## 2020-12-15 DIAGNOSIS — J449 Chronic obstructive pulmonary disease, unspecified: Secondary | ICD-10-CM | POA: Diagnosis not present

## 2020-12-18 ENCOUNTER — Other Ambulatory Visit: Payer: Self-pay | Admitting: Internal Medicine

## 2020-12-18 ENCOUNTER — Other Ambulatory Visit: Payer: Self-pay | Admitting: Cardiology

## 2020-12-18 NOTE — Telephone Encounter (Signed)
Please refill as per office routine med refill policy (all routine meds refilled for 3 mo or monthly per pt preference up to one year from last visit, then month to month grace period for 3 mo, then further med refills will have to be denied)  

## 2020-12-21 ENCOUNTER — Telehealth (HOSPITAL_COMMUNITY): Payer: Self-pay

## 2020-12-21 NOTE — Telephone Encounter (Signed)
Called and spoke with pt in regards to PR, pt stated she is able to participate in PR at this time due to the co-pay.  Closed referral

## 2020-12-24 DIAGNOSIS — K08409 Partial loss of teeth, unspecified cause, unspecified class: Secondary | ICD-10-CM | POA: Diagnosis not present

## 2020-12-24 DIAGNOSIS — I4891 Unspecified atrial fibrillation: Secondary | ICD-10-CM | POA: Diagnosis not present

## 2020-12-24 DIAGNOSIS — J449 Chronic obstructive pulmonary disease, unspecified: Secondary | ICD-10-CM | POA: Diagnosis not present

## 2020-12-24 DIAGNOSIS — I25119 Atherosclerotic heart disease of native coronary artery with unspecified angina pectoris: Secondary | ICD-10-CM | POA: Diagnosis not present

## 2020-12-24 DIAGNOSIS — I10 Essential (primary) hypertension: Secondary | ICD-10-CM | POA: Diagnosis not present

## 2020-12-24 DIAGNOSIS — E785 Hyperlipidemia, unspecified: Secondary | ICD-10-CM | POA: Diagnosis not present

## 2020-12-24 DIAGNOSIS — E669 Obesity, unspecified: Secondary | ICD-10-CM | POA: Diagnosis not present

## 2020-12-24 DIAGNOSIS — E876 Hypokalemia: Secondary | ICD-10-CM | POA: Diagnosis not present

## 2020-12-24 DIAGNOSIS — D6869 Other thrombophilia: Secondary | ICD-10-CM | POA: Diagnosis not present

## 2020-12-24 DIAGNOSIS — E119 Type 2 diabetes mellitus without complications: Secondary | ICD-10-CM | POA: Diagnosis not present

## 2020-12-30 ENCOUNTER — Other Ambulatory Visit: Payer: Self-pay | Admitting: Cardiology

## 2020-12-30 ENCOUNTER — Other Ambulatory Visit: Payer: Self-pay | Admitting: Pulmonary Disease

## 2021-01-01 ENCOUNTER — Telehealth: Payer: Self-pay | Admitting: Pulmonary Disease

## 2021-01-01 NOTE — Telephone Encounter (Signed)
LMTCB

## 2021-01-02 MED ORDER — PREDNISONE 10 MG PO TABS: ORAL_TABLET | ORAL | 0 refills | Status: DC

## 2021-01-02 NOTE — Telephone Encounter (Signed)
Called and spoke with patient. She stated that she has already filled the prednisone RX from 2/3. Will go ahead and send in the new prednisone RX. She verbalized understanding of recommendations.   Nothing further needed at time of call.

## 2021-01-02 NOTE — Telephone Encounter (Signed)
Called and spoke to pt. Pt c/o wheezing, increase in SOB, prod cough with green mucus (little mucus production), chest congestion, throbbing/"wooshing" in ears, ears are hot to the touch. Pt s/s present 4 days ago. Pt has been vaccinated and boosted against covid. Pt states she is taking pred 5mg  daily, however this is not on pt's med list. Pt states her BP was 152/80. Pt denies CP/tightness, f/c/s, swelling. Pt states she has increased her albuterol usage to BID due to acute s/s. Pt last seen in 11/2020 by Dr. Elsworth Soho for COPD and has a pending appt to seen TP in 05/2021.

## 2021-01-02 NOTE — Telephone Encounter (Signed)
Prescription for prednisone was sent to her pharmacy on last office visit 2/3 to be used for an emergency. Please check with patient if she has used this already. If she has, then can send  Prednisone 10 mg tabs  Take 2 tabs daily with food x 5ds, then 1 tab daily with food x 5ds then STOP

## 2021-01-03 ENCOUNTER — Telehealth (INDEPENDENT_AMBULATORY_CARE_PROVIDER_SITE_OTHER): Payer: Medicare HMO | Admitting: Family

## 2021-01-03 DIAGNOSIS — J441 Chronic obstructive pulmonary disease with (acute) exacerbation: Secondary | ICD-10-CM | POA: Diagnosis not present

## 2021-01-03 DIAGNOSIS — J0140 Acute pansinusitis, unspecified: Secondary | ICD-10-CM | POA: Diagnosis not present

## 2021-01-03 MED ORDER — DOXYCYCLINE HYCLATE 100 MG PO TABS: 100.0000 mg | ORAL_TABLET | Freq: Two times a day (BID) | ORAL | 0 refills | Status: DC

## 2021-01-03 NOTE — Progress Notes (Signed)
[name] is a 65 y.o. female with the following history as recorded in EpicCare:  Patient Active Problem List   Diagnosis Date Noted  . Tracheomalacia 11/23/2020  . Vitamin D deficiency 01/03/2020  . HLD (hyperlipidemia) 01/03/2020  . Acute pancreatitis 09/09/2019  . Epigastric pain 09/06/2019  . Back pain 09/06/2019  . Eustachian tube dysfunction 05/18/2019  . Insomnia 05/18/2019  . Chronic respiratory failure with hypoxia ([place]) 12/14/2018  . COPD exacerbation ([place]) 09/03/2017  . Former smoker 03/07/2017  . Sinusitis, acute 04/08/2016  . Left lumbar radiculopathy 10/19/2015  . Cough 08/29/2015  . Pedal edema 02/22/2015  . Diabetes ([place]) 08/20/2014  . Pruritus 07/08/2014  . Nocturnal hypoxemia 07/05/2014  . Esophagus, foreign body 06/12/2014  . Polycythemia, secondary 02/16/2014  . 5 mm Lung nodule, solitary 02/02/2014  . GERD (gastroesophageal reflux disease) 02/01/2014  . Hypersomnolence 02/01/2014  . Allergic rhinitis 09/03/2013  . CAD (coronary artery disease) 09/03/2013  . Encounter for well adult exam with abnormal findings 09/03/2013  . Essential hypertension 07/07/2013  . COPD (chronic obstructive pulmonary disease) with chronic bronchitis ([place]) 07/07/2013  . Status post coronary artery bypass grafting 07/07/2013  . Headache 07/01/2013  . Hypertensive emergency 07/01/2013  . Polycythemia 07/01/2013    Current Outpatient Medications  Medication Sig Dispense Refill  . doxycycline (VIBRA-TABS) 100 MG tablet Take 1 tablet (100 mg total) by mouth 2 (two) times daily. 20 tablet 0  . albuterol (VENTOLIN HFA) 108 (90 Base) MCG/ACT inhaler Inhale 1-2 puffs into the lungs every 6 (six) hours as needed for wheezing or shortness of breath. 18 g 0  . amLODipine (NORVASC) 5 MG tablet Take 1 tablet (5 mg total) by mouth daily. Please make yearly appt with Dr. Marlou Porch for April 2022 before anymore refills. Thank you 1st attempt 90 tablet 0  . atorvastatin (LIPITOR) 40 MG  tablet TAKE 1 TABLET BY MOUTH EVERY DAY 90 tablet 3  . carvedilol (COREG) 12.5 MG tablet TAKE 1 TABLET BY MOUTH TWICE A DAY 180 tablet 3  . cholecalciferol (VITAMIN D) 1000 units tablet Take 1,000 Units by mouth daily.    . Dulaglutide (TRULICITY) 7.10 GY/6.9SW SOPN Inject 0.75 mg into the skin once a week. 6 mL 3  . Fluticasone-Umeclidin-Vilant (TRELEGY ELLIPTA) 100-62.5-25 MCG/INH AEPB INHALE 1 PUFF BY MOUTH EVERY DAY 60 each 1  . Fluticasone-Umeclidin-Vilant (TRELEGY ELLIPTA) 100-62.5-25 MCG/INH AEPB Inhale 1 puff into the lungs daily. 28 each 0  . furosemide (LASIX) 40 MG tablet TAKE 1 TABLET BY MOUTH TWICE A DAY 180 tablet 0  . ipratropium-albuterol (DUONEB) 0.5-2.5 (3) MG/3ML SOLN TAKE 3 MLS BY NEBULIZATION EVERY 6 (SIX) HOURS AS NEEDED. 360 mL 5  . isosorbide mononitrate (IMDUR) 60 MG 24 hr tablet TAKE 1.5 TABLETS BY MOUTH DAILY. PLEASE MAKE A YEARLY APPT WITH DR. Marlou Porch FOR APRIL 2022 135 tablet 2  . KLOR-CON M10 10 MEQ tablet TAKE 1 TABLET BY MOUTH EVERY DAY 90 tablet 0  . metFORMIN (GLUCOPHAGE-XR) 500 MG 24 hr tablet TAKE 1 TABLET BY MOUTH EVERY DAY WITH BREAKFAST 90 tablet 3  . Multiple Vitamin (MULTIVITAMIN WITH MINERALS) TABS tablet Take 1 tablet by mouth daily.    . predniSONE (DELTASONE) 10 MG tablet Take 2 tablets once daily for 5 days with food, then take 1 tablet once daily for 5 days with food then STOP. 15 tablet 0  . traZODone (DESYREL) 50 MG tablet TAKE 1/2 TO 1 TABLET BY MOUTH AT BEDTIME AS NEEDED FOR SLEEP 90 tablet 1  .  triamcinolone (NASACORT) 55 MCG/ACT AERO nasal inhaler Place 2 sprays into the nose daily. 1 Inhaler 12  . vitamin B-12 (CYANOCOBALAMIN) 1000 MCG tablet Take 1,000 mcg by mouth daily.    . vitamin C (ASCORBIC ACID) 500 MG tablet Take 500 mg by mouth daily.     . Vitamin D, Ergocalciferol, (DRISDOL) 1.25 MG (50000 UNIT) CAPS capsule Take 1 capsule (50,000 Units total) by mouth every 7 (seven) days. 12 capsule 0   No current facility-administered medications  for this visit.    Allergies: Augmentin [amoxicillin-pot clavulanate]  Past Medical History:  Diagnosis Date  . Bronchitis   . CAD (coronary artery disease)    a. 02/2002: CABG x3 with LIMA to LAD, RIMA to RCA, and SVG to OM  . CHF (congestive heart failure) ([place])   . COPD (chronic obstructive pulmonary disease) ([place])   . Diabetes ([place])   . Headache   . Heart disease   . Hyperlipidemia   . Hypertension   . Impaired glucose tolerance 08/20/2014  . Sinusitis     Past Surgical History:  Procedure Laterality Date  . CORONARY ARTERY BYPASS GRAFT  2003   Triple bypass  . ESOPHAGOGASTRODUODENOSCOPY Left 06/13/2014   Procedure: ESOPHAGOGASTRODUODENOSCOPY (EGD);  Surgeon: Juanita Craver, MD;  Location: [facility] ENDOSCOPY;  Service: Endoscopy;  Laterality: Left;  . RIGID ESOPHAGOSCOPY N/A 06/12/2014   Procedure: RIGID ESOPHAGOSCOPY WITH FOREIGN BODY REMOVAL;  Surgeon: Ascencion Dike, MD;  Location: [facility];  Service: ENT;  Laterality: N/A;    Family History  Problem Relation Age of Onset  . Lung cancer Paternal Uncle   . Heart disease Mother   . Brain cancer Mother   . Cancer Maternal Grandmother        colon  . Stroke Other   . Cancer Other        x 4 aunts  . Lung cancer Maternal Aunt   . Suicidality Brother     Social History   Tobacco Use  . Smoking status: Former Smoker    Packs/day: 0.50    Years: 47.00    Pack years: 23.50    Types: Cigarettes    Start date: 12/29/1971    Quit date: 05/22/2019    Years since quitting: 1.6  . Smokeless tobacco: Never Used  Substance Use Topics  . Alcohol use: Yes    Alcohol/week: 5.0 - 6.0 standard drinks    Types: 5 - 6 Glasses of wine per week    Subjective:   I connected with Lorenda Peck on 01/03/21 at  9:00 AM EDT by a telephone call and verified that I am speaking with the correct person using two identifiers.   I discussed the limitations of evaluation and management by telemedicine and the availability of in person appointments. The  patient expressed understanding and agreed to proceed. Provider in office/ patient is at home; provider and patient are only 2 people on telephone call.   4 day history of worsening cough/ congestion; severe COPD- talked to her pulmonologist yesterday and was prescribed oral prednisone; she is concerned that she needs an antibiotic; she has taken a home COVID test which was negative- she does not feel that she needs to come in for PCR testing at this time as she has been home and isolated for at least the past week; denies any fever; feels like she has sinus pain/ pressure/ bilateral ear pain;     Objective:  There were no vitals filed for this visit.  Lungs:  Respirations unlabored; Neurologic: Alert and oriented; speech intact;  Assessment:  1. COPD exacerbation ([place])   2. Acute pansinusitis, recurrence not specified     Plan:  She will start the prednisone prescribed yesterday and will add Doxycyline 100 mg bid x 10 days; she defers COVID testing at this time; will need to follow-up if symptoms are worse, no better.   Time spent 11 minutes   No follow-ups on file.  No orders of the defined types were placed in this encounter.   Requested Prescriptions   Signed Prescriptions Disp Refills  . doxycycline (VIBRA-TABS) 100 MG tablet 20 tablet 0    Sig: Take 1 tablet (100 mg total) by mouth 2 (two) times daily.

## 2021-01-12 DIAGNOSIS — J449 Chronic obstructive pulmonary disease, unspecified: Secondary | ICD-10-CM | POA: Diagnosis not present

## 2021-01-28 ENCOUNTER — Other Ambulatory Visit: Payer: Self-pay | Admitting: Pulmonary Disease

## 2021-02-12 ENCOUNTER — Other Ambulatory Visit: Payer: Self-pay | Admitting: Internal Medicine

## 2021-02-12 DIAGNOSIS — J449 Chronic obstructive pulmonary disease, unspecified: Secondary | ICD-10-CM | POA: Diagnosis not present

## 2021-02-12 DIAGNOSIS — I25119 Atherosclerotic heart disease of native coronary artery with unspecified angina pectoris: Secondary | ICD-10-CM

## 2021-02-27 ENCOUNTER — Other Ambulatory Visit: Payer: Self-pay | Admitting: Internal Medicine

## 2021-02-27 ENCOUNTER — Telehealth: Payer: Self-pay | Admitting: Internal Medicine

## 2021-02-27 DIAGNOSIS — Z1231 Encounter for screening mammogram for malignant neoplasm of breast: Secondary | ICD-10-CM

## 2021-02-27 NOTE — Telephone Encounter (Signed)
Please refill as per office routine med refill policy (all routine meds refilled for 3 mo or monthly per pt preference up to one year from last visit, then month to month grace period for 3 mo, then further med refills will have to be denied)  

## 2021-02-27 NOTE — Telephone Encounter (Signed)
   Patient requesting order for screening mammogram No location preference

## 2021-02-27 NOTE — Telephone Encounter (Signed)
Ok done

## 2021-02-28 NOTE — Telephone Encounter (Signed)
Patient notified and [facility] schedule.

## 2021-03-06 ENCOUNTER — Ambulatory Visit (INDEPENDENT_AMBULATORY_CARE_PROVIDER_SITE_OTHER): Payer: Medicare HMO | Admitting: Cardiology

## 2021-03-06 ENCOUNTER — Encounter: Payer: Self-pay | Admitting: Cardiology

## 2021-03-06 ENCOUNTER — Other Ambulatory Visit: Payer: Self-pay

## 2021-03-06 VITALS — BP 110/60 | HR 63 | Ht 61.0 in | Wt 169.0 lb

## 2021-03-06 DIAGNOSIS — Z951 Presence of aortocoronary bypass graft: Secondary | ICD-10-CM | POA: Diagnosis not present

## 2021-03-06 DIAGNOSIS — I25709 Atherosclerosis of coronary artery bypass graft(s), unspecified, with unspecified angina pectoris: Secondary | ICD-10-CM | POA: Diagnosis not present

## 2021-03-06 DIAGNOSIS — I209 Angina pectoris, unspecified: Secondary | ICD-10-CM

## 2021-03-06 NOTE — Patient Instructions (Signed)
Medication Instructions:  The current medical regimen is effective;  continue present plan and medications.  *If you need a refill on your cardiac medications before your next appointment, please call your pharmacy*  Follow-Up: At [facility], you and your health needs are our priority.  As part of our continuing mission to provide you with exceptional heart care, we have created designated Provider Care Teams.  These Care Teams include your primary Cardiologist (physician) and Advanced Practice Providers (APPs -  Physician Assistants and Nurse Practitioners) who all work together to provide you with the care you need, when you need it.  We recommend signing up for the patient portal called "MyChart".  Sign up information is provided on this After Visit Summary.  MyChart is used to connect with patients for Virtual Visits (Telemedicine).  Patients are able to view lab/test results, encounter notes, upcoming appointments, etc.  Non-urgent messages can be sent to your provider as well.   To learn more about what you can do with MyChart, go to [email or web address].    Your next appointment:   12 month(s)  The format for your next appointment:   In Person  Provider:   Mark Skains, MD   Thank you for choosing [facility]!!      

## 2021-03-06 NOTE — Progress Notes (Signed)
Cardiology Office Note:    Date:  03/06/2021   ID:  Rhonda, Fleming 1956/09/01, MRN [id number]  PCP:  Biagio Borg, MD   [facility] Providers Cardiologist:  Candee Furbish, MD     Referring MD: Biagio Borg, MD    History of Present Illness:    [name] is a 65 y.o. female here for the follow-up of CAD CABG COPD palpitations.  Previously been to the emergency room for COPD exacerbations.  Prior CT scan of chest was negative for PE.  She states that her lungs are getting worse.  She is using chronic oxygen.  When her oxygen levels get low, she does get a migraine.  Occasionally she will have tingling in her hands and feet, likely neuropathy.  She also states that she will have sharp like chest discomfort at times.  Occasional pressure but not frequent.  Last stress test in 2019 was low risk.  Past Medical History:  Diagnosis Date  . Bronchitis   . CAD (coronary artery disease)    a. 02/2002: CABG x3 with LIMA to LAD, RIMA to RCA, and SVG to OM  . CHF (congestive heart failure) ([place])   . COPD (chronic obstructive pulmonary disease) ([place])   . Diabetes ([place])   . Headache   . Heart disease   . Hyperlipidemia   . Hypertension   . Impaired glucose tolerance 08/20/2014  . Sinusitis     Past Surgical History:  Procedure Laterality Date  . CORONARY ARTERY BYPASS GRAFT  2003   Triple bypass  . ESOPHAGOGASTRODUODENOSCOPY Left 06/13/2014   Procedure: ESOPHAGOGASTRODUODENOSCOPY (EGD);  Surgeon: Juanita Craver, MD;  Location: [facility] ENDOSCOPY;  Service: Endoscopy;  Laterality: Left;  . RIGID ESOPHAGOSCOPY N/A 06/12/2014   Procedure: RIGID ESOPHAGOSCOPY WITH FOREIGN BODY REMOVAL;  Surgeon: Ascencion Dike, MD;  Location: [facility] OR;  Service: ENT;  Laterality: N/A;    Current Medications: Current Meds  Medication Sig  . albuterol (VENTOLIN HFA) 108 (90 Base) MCG/ACT inhaler Inhale 1-2 puffs into the lungs every 6 (six) hours as needed for wheezing or shortness of breath.  Marland Kitchen amLODipine  (NORVASC) 5 MG tablet Take 1 tablet (5 mg total) by mouth daily. Please make yearly appt with Dr. Marlou Porch for April 2022 before anymore refills. Thank you 1st attempt  . atorvastatin (LIPITOR) 40 MG tablet TAKE 1 TABLET BY MOUTH EVERY DAY  . carvedilol (COREG) 12.5 MG tablet TAKE 1 TABLET BY MOUTH TWICE A DAY  . cholecalciferol (VITAMIN D) 1000 units tablet Take 1,000 Units by mouth daily.  Marland Kitchen doxycycline (VIBRA-TABS) 100 MG tablet Take 1 tablet (100 mg total) by mouth 2 (two) times daily.  . Dulaglutide (TRULICITY) 0.62 IR/4.8NI SOPN Inject 0.75 mg into the skin once a week.  . Fluticasone-Umeclidin-Vilant (TRELEGY ELLIPTA) 100-62.5-25 MCG/INH AEPB Inhale 1 puff into the lungs daily.  . furosemide (LASIX) 40 MG tablet TAKE 1 TABLET BY MOUTH TWICE A DAY  . ipratropium-albuterol (DUONEB) 0.5-2.5 (3) MG/3ML SOLN TAKE 3 MLS BY NEBULIZATION EVERY 6 (SIX) HOURS AS NEEDED.  Marland Kitchen isosorbide mononitrate (IMDUR) 60 MG 24 hr tablet TAKE 1.5 TABLETS BY MOUTH DAILY. PLEASE MAKE A YEARLY APPT WITH DR. Marlou Porch FOR APRIL 2022  . KLOR-CON M10 10 MEQ tablet TAKE 1 TABLET BY MOUTH EVERY DAY  . metFORMIN (GLUCOPHAGE-XR) 500 MG 24 hr tablet TAKE 1 TABLET BY MOUTH EVERY DAY WITH BREAKFAST  . Multiple Vitamin (MULTIVITAMIN WITH MINERALS) TABS tablet Take 1 tablet by mouth daily.  Marland Kitchen  predniSONE (DELTASONE) 10 MG tablet Take 2 tablets once daily for 5 days with food, then take 1 tablet once daily for 5 days with food then STOP.  Marland Kitchen traZODone (DESYREL) 50 MG tablet TAKE 1/2 TO 1 TABLET BY MOUTH AT BEDTIME AS NEEDED FOR SLEEP  . TRELEGY ELLIPTA 100-62.5-25 MCG/INH AEPB INHALE 1 PUFF BY MOUTH EVERY DAY  . triamcinolone (NASACORT) 55 MCG/ACT AERO nasal inhaler Place 2 sprays into the nose daily.  . vitamin B-12 (CYANOCOBALAMIN) 1000 MCG tablet Take 1,000 mcg by mouth daily.  . vitamin C (ASCORBIC ACID) 500 MG tablet Take 500 mg by mouth daily.   . Vitamin D, Ergocalciferol, (DRISDOL) 1.25 MG (50000 UNIT) CAPS capsule Take 1 capsule  (50,000 Units total) by mouth every 7 (seven) days.     Allergies:   Augmentin [amoxicillin-pot clavulanate]   Social History   Socioeconomic History  . Marital status: Single    Spouse name: Not on file  . Number of children: Not on file  . Years of education: Not on file  . Highest education level: Not on file  Occupational History  . Not on file  Tobacco Use  . Smoking status: Former Smoker    Packs/day: 0.50    Years: 47.00    Pack years: 23.50    Types: Cigarettes    Start date: 12/29/1971    Quit date: 05/22/2019    Years since quitting: 1.7  . Smokeless tobacco: Never Used  Substance and Sexual Activity  . Alcohol use: Yes    Alcohol/week: 5.0 - 6.0 standard drinks    Types: 5 - 6 Glasses of wine per week  . Drug use: Not Currently    Types: Marijuana    Comment: as a teenager  . Sexual activity: Not Currently    Birth control/protection: None  Other Topics Concern  . Not on file  Social History Narrative  . Not on file   Social Determinants of Health   Financial Resource Strain: Not on file  Food Insecurity: Not on file  Transportation Needs: Not on file  Physical Activity: Not on file  Stress: Not on file  Social Connections: Not on file     Family History: The patient's family history includes Brain cancer in her mother; Cancer in her maternal grandmother and another family member; Heart disease in her mother; Lung cancer in her maternal aunt and paternal uncle; Stroke in an other family member; Suicidality in her brother.  ROS:   Please see the history of present illness.     All other systems reviewed and are negative.  EKGs/Labs/Other Studies Reviewed:    The following studies were reviewed today:   EKG:  EKG is  ordered today.  The ekg ordered today demonstrates ectopic atrial rhythm 63   Recent Labs: 03/31/2020: ALT 30; BUN 15; Creatinine, Ser 0.40; Hemoglobin 13.6; Platelets 206.0; Potassium 4.0; Sodium 139  Recent Lipid Panel     Component Value Date/Time   CHOL 146 03/31/2020 1526   TRIG 308.0 (H) 03/31/2020 1526   HDL 36.90 (L) 03/31/2020 1526   CHOLHDL 4 03/31/2020 1526   VLDL 61.6 (H) 03/31/2020 1526   LDLCALC 68 02/02/2014 0317   LDLDIRECT 68.0 03/31/2020 1526     Risk Assessment/Calculations:      Physical Exam:    VS:  BP 110/60 (BP Location: Left Arm, Patient Position: Sitting, Cuff Size: Normal)   Pulse 63   Ht 5\' 1"  (1.549 m)   Wt 169 lb (76.7 kg)  SpO2 95%   BMI 31.93 kg/m     Wt Readings from Last 3 Encounters:  03/06/21 169 lb (76.7 kg)  11/23/20 164 lb 6.4 oz (74.6 kg)  05/01/20 171 lb 9.6 oz (77.8 kg)     GEN:  Well nourished, well developed in no acute distress HEENT: Normal NECK: No JVD; No carotid bruits LYMPHATICS: No lymphadenopathy CARDIAC: RRR, no murmurs, rubs, gallops RESPIRATORY:  Clear to auscultation without rales, wheezing or rhonchi  ABDOMEN: Soft, non-tender, non-distended MUSCULOSKELETAL:  No edema; No deformity  SKIN: Warm and dry NEUROLOGIC:  Alert and oriented x 3 PSYCHIATRIC:  Normal affect   ASSESSMENT:    1. Coronary artery disease involving coronary bypass graft of native heart with angina pectoris ([place])   2. Angina pectoris ([place])   3. Status post coronary artery bypass grafting    PLAN:    In order of problems listed above:  Paroxysmal atrial tachycardia - Currently on carvedilol 12.5 mg twice a day.  Also has Cardizem 30 mg every 6 hours as needed for palpitations.  Previous EF was normal on echocardiogram.  COPD - Recent exacerbation, has been on prednisone taper.  Diabetes with hypertension - Occasionally blood sugars will become quite elevated with diabetes, prednisone.  CAD with angina - On isosorbide which has been titrated to 90 mg once a day.  She can trial this medication during the daytime hours instead of taking it at night.  This may help with some of her chest discomfort.  Other chest discomfort may be atypical, described as  sharp.  Tobacco use - Continue to encourage cessation.  Hyperlipidemia - Continue with high intensity statin atorvastatin 40 mg once a day.  Provide refills as needed.  Continue with current medication management.    Medication Adjustments/Labs and Tests Ordered: Current medicines are reviewed at length with the patient today.  Concerns regarding medicines are outlined above.  Orders Placed This Encounter  Procedures  . EKG 12-Lead   No orders of the defined types were placed in this encounter.   Patient Instructions  Medication Instructions:  The current medical regimen is effective;  continue present plan and medications.  *If you need a refill on your cardiac medications before your next appointment, please call your pharmacy*  Follow-Up: At [facility], you and your health needs are our priority.  As part of our continuing mission to provide you with exceptional heart care, we have created designated Provider Care Teams.  These Care Teams include your primary Cardiologist (physician) and Advanced Practice Providers (APPs -  Physician Assistants and Nurse Practitioners) who all work together to provide you with the care you need, when you need it.  We recommend signing up for the patient portal called "MyChart".  Sign up information is provided on this After Visit Summary.  MyChart is used to connect with patients for Virtual Visits (Telemedicine).  Patients are able to view lab/test results, encounter notes, upcoming appointments, etc.  Non-urgent messages can be sent to your provider as well.   To learn more about what you can do with MyChart, go to [email or web address].    Your next appointment:   12 month(s)  The format for your next appointment:   In Person  Provider:   Candee Furbish, MD   Thank you for choosing [facility]!!        Signed, Candee Furbish, MD  03/06/2021 5:01 PM    [place]

## 2021-03-07 ENCOUNTER — Telehealth: Payer: Self-pay | Admitting: Pulmonary Disease

## 2021-03-07 NOTE — Telephone Encounter (Signed)
Attempted to call pt but line went directly to VM. Left message for her to return call. °

## 2021-03-08 MED ORDER — PREDNISONE 10 MG (21) PO TBPK: ORAL_TABLET | Freq: Every day | ORAL | 0 refills | Status: DC

## 2021-03-08 MED ORDER — TRELEGY ELLIPTA 200-62.5-25 MCG/INH IN AEPB: 1.0000 | INHALATION_SPRAY | Freq: Every day | RESPIRATORY_TRACT | 5 refills | Status: DC

## 2021-03-08 NOTE — Telephone Encounter (Signed)
Primary Pulmonologist: Dr. Elsworth Soho Last office visit and with whom: 11/23/20 with Dr. Elsworth Soho What do we see them for (pulmonary problems): COPD, Chronic Respiratory failure with Hypoxia and Tracheomalacia Last OV assessment/plan: see below  Was appointment offered to patient (explain)?    Assessment & Plan:        Assessment & Plan Note by Rigoberto Noel, MD at 11/23/2020 2:49 PM  Author: Rigoberto Noel, MD Author Type: Physician Filed: 11/23/2020 2:49 PM  Note Status: Written Cosign: Cosign Not Required Encounter Date: 11/23/2020  Problem: Tracheomalacia  Editor: Rigoberto Noel, MD (Physician)             Noted on CT 01/2020 -do not think this is contributing to her exacerbations       Assessment & Plan Note by Rigoberto Noel, MD at 11/23/2020 2:48 PM  Author: Rigoberto Noel, MD Author Type: Physician Filed: 11/23/2020 2:48 PM  Note Status: Written Cosign: Cosign Not Required Encounter Date: 11/23/2020  Problem: Chronic respiratory failure with hypoxia ([place])  Editor: Rigoberto Noel, MD (Physician)             Continue oxygen use at all times. She does not seem to need NIV at this time but she does have some degree of tracheomalacia and we could consider this in the future especially if she has frequent exacerbations       Assessment & Plan Note by Rigoberto Noel, MD at 11/23/2020 2:47 PM  Author: Rigoberto Noel, MD Author Type: Physician Filed: 11/23/2020 2:48 PM  Note Status: Written Cosign: Cosign Not Required Encounter Date: 11/23/2020  Problem: COPD (chronic obstructive pulmonary disease) with chronic bronchitis ([place])  Editor: Rigoberto Noel, MD (Physician)             Sample of Trelegy. Prescription for prednisone will be kept pending in your pharmacy, to use when she has a flareup  Prednisone 10 mg tabs Take 4 tabs  daily with food x 4 days, then 3 tabs daily x 4 days, then 2 tabs daily x 4 days, then 1 tab daily x4 days then stop. #40   Referral to pulmonary rehab            Reason for call: patient has been having increased SOB, chest congestion and cannot get anything to come up for a few days. No fevers or chills, nausea or vomiting. Has been using inhalers as prescribed. Patient stated Dr. Elsworth Soho is aware of this and knows what to send in for her and requesting prednisone. Patient is also requesting to increase to Trelegy 200 as the Trelegy 100 is not helping enough. Will route to Dr. Elsworth Soho  Dr. Elsworth Soho please advise. thanks  (examples of things to ask: : When did symptoms start? Fever? Cough? Productive? Color to sputum? More sputum than usual? Wheezing? Have you needed increased oxygen? Are you taking your respiratory medications? What over the counter measures have you tried?)  Allergies  Allergen Reactions  . Augmentin [Amoxicillin-Pot Clavulanate] Nausea And Vomiting    Immunization History  Administered Date(s) Administered  . Influenza Split 07/08/2013  . Influenza,inj,Quad PF,6+ Mos 07/31/2015, 09/03/2017  . PFIZER(Purple Top)SARS-COV-2 Vaccination 01/01/2020, 01/25/2020  . Pneumococcal Polysaccharide-23 02/02/2014, 05/03/2019

## 2021-03-08 NOTE — Telephone Encounter (Signed)
I have called and spoke with patient regarding Dr. Elsworth Soho recs. Patient verbalized understanding and I sent in prednisone and Trelegy 200 to patient preferred pharmacy. Patient agreed to call if not feeling better/ if not Trelegy 200 not working. Nothing further needed.

## 2021-03-08 NOTE — Telephone Encounter (Signed)
I called and spoke with patient who had a VM from [place]. It was in regards to sick message that I had already sent to Dr. Elsworth Soho and told patient we are waiting to hear back from him. Patient verbalized understanding.

## 2021-03-08 NOTE — Telephone Encounter (Signed)
Prednisone 10 mg tabs  Take 2 tabs daily with food x 5ds, then 1 tab daily with food x 5ds then STOP Okay to change to Trelegy 200

## 2021-03-14 DIAGNOSIS — J449 Chronic obstructive pulmonary disease, unspecified: Secondary | ICD-10-CM | POA: Diagnosis not present

## 2021-03-20 ENCOUNTER — Telehealth: Payer: Self-pay | Admitting: Pulmonary Disease

## 2021-03-20 NOTE — Telephone Encounter (Signed)
Called and spoke to pt. Pt states her s/s have returned since her last round of pred. Pt c/o SOB, cough, malaise. Pt has been scripted three rounds of pred since last OV in 11/2020. OV scheduled with Rexene Edison, NP, on 03/21/2021. Pt aware to seek emergency care if any new or worsening s/s.   Will forward to TP as FYI.

## 2021-03-21 ENCOUNTER — Ambulatory Visit (INDEPENDENT_AMBULATORY_CARE_PROVIDER_SITE_OTHER): Payer: Medicare HMO | Admitting: Adult Health

## 2021-03-21 ENCOUNTER — Encounter: Payer: Self-pay | Admitting: Adult Health

## 2021-03-21 ENCOUNTER — Other Ambulatory Visit: Payer: Self-pay

## 2021-03-21 VITALS — BP 100/50 | HR 80 | Temp 98.2°F | Ht 61.0 in | Wt 169.0 lb

## 2021-03-21 DIAGNOSIS — J9611 Chronic respiratory failure with hypoxia: Secondary | ICD-10-CM | POA: Diagnosis not present

## 2021-03-21 DIAGNOSIS — J441 Chronic obstructive pulmonary disease with (acute) exacerbation: Secondary | ICD-10-CM

## 2021-03-21 DIAGNOSIS — J449 Chronic obstructive pulmonary disease, unspecified: Secondary | ICD-10-CM | POA: Diagnosis not present

## 2021-03-21 LAB — CBC WITH DIFFERENTIAL/PLATELET
Basophils Absolute: 0 10*3/uL (ref 0.0–0.1)
Basophils Relative: 0.4 % (ref 0.0–3.0)
Eosinophils Absolute: 0.2 10*3/uL (ref 0.0–0.7)
Eosinophils Relative: 2.9 % (ref 0.0–5.0)
HCT: 41.6 % (ref 36.0–46.0)
Hemoglobin: 13.3 g/dL (ref 12.0–15.0)
Lymphocytes Relative: 23.4 % (ref 12.0–46.0)
Lymphs Abs: 1.8 10*3/uL (ref 0.7–4.0)
MCHC: 31.9 g/dL (ref 30.0–36.0)
MCV: 88.6 fl (ref 78.0–100.0)
Monocytes Absolute: 0.6 10*3/uL (ref 0.1–1.0)
Monocytes Relative: 7.3 % (ref 3.0–12.0)
Neutro Abs: 5.1 10*3/uL (ref 1.4–7.7)
Neutrophils Relative %: 66 % (ref 43.0–77.0)
Platelets: 161 10*3/uL (ref 150.0–400.0)
RBC: 4.69 Mil/uL (ref 3.87–5.11)
RDW: 13.7 % (ref 11.5–15.5)
WBC: 7.8 10*3/uL (ref 4.0–10.5)

## 2021-03-21 LAB — BRAIN NATRIURETIC PEPTIDE: Pro B Natriuretic peptide (BNP): 48 pg/mL (ref 0.0–100.0)

## 2021-03-21 MED ORDER — TRELEGY ELLIPTA 200-62.5-25 MCG/INH IN AEPB: 1.0000 | INHALATION_SPRAY | Freq: Every day | RESPIRATORY_TRACT | 0 refills | Status: DC

## 2021-03-21 NOTE — Patient Instructions (Signed)
Continue on TRELEGY 1 puff daily  May use Duoneb as needed.  Continue on Oxygen 3l/m .  Activity as tolerated.  Work on not smoking .  Labs today  Prednisone 20mg  daily for 1 week and 10mg  daily and then stop  Follow up with Dr. Elsworth Soho  In 2 months and As needed   Please contact office for sooner follow up if symptoms do not improve or worsen or seek emergency care

## 2021-03-21 NOTE — Progress Notes (Signed)
@Patient  ID: [name], female    DOB: [birth year], 65 y.o.   MRN: [id number]  Chief Complaint  Patient presents with  . Acute Visit    Referring provider: Biagio Borg, MD  HPI: 65 year old female former smoker followed for COPD and oxygen dependent respiratory failure.  Mild tracheomalacia  on CT scan. Medical history significant for diabetes   TEST/EVENTS :  CT angiogram chest 01/2020-emphysema, mild tracheomalacia  04/2019-pulmonary function test-FVC 1.05 (44% predicted), postbronchodilator ratio 47, postbronchodilator FEV1 0.47 (25% predicted), DLCO 54  Spirometry5/2018 >>ratio of 59, FEV1 of 42% FVC of 56% ABG 01/2014 was 7.29/66/75/92% CT angiogram 11/2016 was negative for pulmonary embolism  08/2014 >>PSG neg for OSA , Desaturation as low as 85%  03/21/2021 Acute OV : COPD  Patient presents for an acute office visit.  Patient complains over the last few weeks her breathing has not been doing as well.  She is maintained on Trelegy inhaler daily.  And DuoNeb twice daily she has received a prednisone burst.  Feels that it helped some but symptoms came right back.  She has minimum cough or wheezing.  She denies any hemoptysis, fever, discolored mucus. Patient has underlying very severe COPD with an FEV1 and 2020 at 25%.  Patient does live alone.  She is able to drive.  She uses a POC when she is out from her home.  We did discuss home services such as home health or possibly looking at palliative care for chronic disease management patient declines at this time. She has been referred to pulmonary rehab but says that she could not afford this. She remains on oxygen 3 L.  She denies any increased oxygen needs.  Patient does continue to smoke.  We discussed smoking cessation.    Allergies  Allergen Reactions  . Augmentin [Amoxicillin-Pot Clavulanate] Nausea And Vomiting    Immunization History  Administered Date(s) Administered  . Influenza Split 07/08/2013  .  Influenza,inj,Quad PF,6+ Mos 07/31/2015, 09/03/2017  . PFIZER(Purple Top)SARS-COV-2 Vaccination 01/01/2020, 01/25/2020  . Pneumococcal Polysaccharide-23 02/02/2014, 05/03/2019    Past Medical History:  Diagnosis Date  . Bronchitis   . CAD (coronary artery disease)    a. 02/2002: CABG x3 with LIMA to LAD, RIMA to RCA, and SVG to OM  . CHF (congestive heart failure) ([place])   . COPD (chronic obstructive pulmonary disease) ([place])   . Diabetes ([place])   . Headache   . Heart disease   . Hyperlipidemia   . Hypertension   . Impaired glucose tolerance 08/20/2014  . Sinusitis     Tobacco History: Social History   Tobacco Use  Smoking Status Former Smoker  . Packs/day: 0.50  . Years: 47.00  . Pack years: 23.50  . Types: Cigarettes  . Start date: 12/29/1971  . Quit date: 05/22/2019  . Years since quitting: 1.8  Smokeless Tobacco Never Used   Counseling given: Not Answered   Outpatient Medications Prior to Visit  Medication Sig Dispense Refill  . albuterol (VENTOLIN HFA) 108 (90 Base) MCG/ACT inhaler Inhale 1-2 puffs into the lungs every 6 (six) hours as needed for wheezing or shortness of breath. 18 g 0  . amLODipine (NORVASC) 5 MG tablet Take 1 tablet (5 mg total) by mouth daily. Please make yearly appt with Dr. Marlou Porch for April 2022 before anymore refills. Thank you 1st attempt 90 tablet 0  . atorvastatin (LIPITOR) 40 MG tablet TAKE 1 TABLET BY MOUTH EVERY DAY 90 tablet 3  . carvedilol (COREG) 12.5 MG  tablet TAKE 1 TABLET BY MOUTH TWICE A DAY 180 tablet 3  . cholecalciferol (VITAMIN D) 1000 units tablet Take 1,000 Units by mouth daily.    . Dulaglutide (TRULICITY) 8.29 HB/7.1IR SOPN Inject 0.75 mg into the skin once a week. 6 mL 3  . Fluticasone-Umeclidin-Vilant (TRELEGY ELLIPTA) 100-62.5-25 MCG/INH AEPB Inhale 1 puff into the lungs daily. 28 each 0  . Fluticasone-Umeclidin-Vilant (TRELEGY ELLIPTA) 200-62.5-25 MCG/INH AEPB Inhale 1 puff into the lungs daily. 1 each 5  . furosemide  (LASIX) 40 MG tablet TAKE 1 TABLET BY MOUTH TWICE A DAY 180 tablet 0  . ipratropium-albuterol (DUONEB) 0.5-2.5 (3) MG/3ML SOLN TAKE 3 MLS BY NEBULIZATION EVERY 6 (SIX) HOURS AS NEEDED. 360 mL 5  . isosorbide mononitrate (IMDUR) 60 MG 24 hr tablet TAKE 1.5 TABLETS BY MOUTH DAILY. PLEASE MAKE A YEARLY APPT WITH DR. Marlou Porch FOR APRIL 2022 135 tablet 2  . KLOR-CON M10 10 MEQ tablet TAKE 1 TABLET BY MOUTH EVERY DAY 90 tablet 0  . metFORMIN (GLUCOPHAGE-XR) 500 MG 24 hr tablet TAKE 1 TABLET BY MOUTH EVERY DAY WITH BREAKFAST 90 tablet 3  . Multiple Vitamin (MULTIVITAMIN WITH MINERALS) TABS tablet Take 1 tablet by mouth daily.    . vitamin B-12 (CYANOCOBALAMIN) 1000 MCG tablet Take 1,000 mcg by mouth daily.    . vitamin C (ASCORBIC ACID) 500 MG tablet Take 500 mg by mouth daily.     . Vitamin D, Ergocalciferol, (DRISDOL) 1.25 MG (50000 UNIT) CAPS capsule Take 1 capsule (50,000 Units total) by mouth every 7 (seven) days. 12 capsule 0  . doxycycline (VIBRA-TABS) 100 MG tablet Take 1 tablet (100 mg total) by mouth 2 (two) times daily. 20 tablet 0  . predniSONE (DELTASONE) 10 MG tablet Take 2 tablets once daily for 5 days with food, then take 1 tablet once daily for 5 days with food then STOP. 15 tablet 0  . predniSONE (STERAPRED UNI-PAK 21 TAB) 10 MG (21) TBPK tablet Take by mouth daily. Take 2 tabs with food for 5 days, then 1 tab for days with food then stop. 15 tablet 0  . traZODone (DESYREL) 50 MG tablet TAKE 1/2 TO 1 TABLET BY MOUTH AT BEDTIME AS NEEDED FOR SLEEP 90 tablet 1  . TRELEGY ELLIPTA 100-62.5-25 MCG/INH AEPB INHALE 1 PUFF BY MOUTH EVERY DAY 60 each 5  . triamcinolone (NASACORT) 55 MCG/ACT AERO nasal inhaler Place 2 sprays into the nose daily. 1 Inhaler 12   No facility-administered medications prior to visit.     Review of Systems:   Constitutional:   No  weight loss, night sweats,  Fevers, chills, + fatigue, or  lassitude.  HEENT:   No headaches,  Difficulty swallowing,  Tooth/dental  problems, or  Sore throat,                No sneezing, itching, ear ache, nasal congestion, post nasal drip,   CV:  No chest pain,  Orthopnea, PND, swelling in lower extremities, anasarca, dizziness, palpitations, syncope.   GI  No heartburn, indigestion, abdominal pain, nausea, vomiting, diarrhea, change in bowel habits, loss of appetite, bloody stools.   Resp:  .  No chest wall deformity  Skin: no rash or lesions.  GU: no dysuria, change in color of urine, no urgency or frequency.  No flank pain, no hematuria   MS:  No joint pain or swelling.  No decreased range of motion.  No back pain.    Physical Exam    GEN: A/Ox3; pleasant ,  NAD, on oxygen   HEENT:  Box/AT,   OSE-clear, THROAT-clear, no lesions, no postnasal drip or exudate noted.   NECK:  Supple w/ fair ROM; no JVD; normal carotid impulses w/o bruits; no thyromegaly or nodules palpated; no lymphadenopathy.    RESP decreased breath sounds in the bases  no accessory muscle use, no dullness to percussion  CARD:  RRR, no m/r/g, no peripheral edema, pulses intact, no cyanosis or clubbing.  GI:   Soft & nt; nml bowel sounds; no organomegaly or masses detected.   Musco: Warm bil, no deformities or joint swelling noted.   Neuro: alert, no focal deficits noted.    Skin: Warm, no lesions or rashes    Lab Results:  CBC  BMET  Imaging: No results found.    PFT Results Latest Ref Rng & Units 05/03/2019  FVC-Pre L 1.05  FVC-Predicted Pre % 44  FVC-Post L 1.00  FVC-Predicted Post % 42  Pre FEV1/FVC % % 49  Post FEV1/FCV % % 47  FEV1-Pre L 0.52  FEV1-Predicted Pre % 28  FEV1-Post L 0.47  DLCO uncorrected ml/min/mmHg 10.16  DLCO UNC% % 54  DLCO corrected ml/min/mmHg 9.48  DLCO COR %Predicted % 50  DLVA Predicted % 87  TLC L 3.81  TLC % Predicted % 80  RV % Predicted % 136    No results found for: NITRICOXIDE      Assessment & Plan:   No problem-specific Assessment & Plan notes found for this  encounter.     Rexene Edison, NP 03/21/2021

## 2021-03-22 NOTE — Assessment & Plan Note (Signed)
Slow to resolve COPD exacerbation.  Check labs today and chest x-ray. Brief steroid burst. Smoking cessation.  Continue triple therapy.  And may use her rescue Laurence Ferrari with [facility]  Patient Instructions  Continue on TRELEGY 1 puff daily  May use Duoneb as needed.  Continue on Oxygen 3l/m .  Activity as tolerated.  Work on not smoking .  Labs today  Prednisone 20mg  daily for 1 week and 10mg  daily and then stop  Follow up with Dr. Elsworth Soho  In 2 months and As needed   Please contact office for sooner follow up if symptoms do not improve or worsen or seek emergency care

## 2021-03-22 NOTE — Assessment & Plan Note (Signed)
Continue on oxygen to maintain O2 saturations greater than 88 to 90%. 

## 2021-03-23 LAB — BASIC METABOLIC PANEL
BUN: 12 mg/dL (ref 6–23)
CO2: 37 mEq/L — ABNORMAL HIGH (ref 19–32)
Calcium: 9.6 mg/dL (ref 8.4–10.5)
Chloride: 94 mEq/L — ABNORMAL LOW (ref 96–112)
Creatinine, Ser: 0.57 mg/dL (ref 0.40–1.20)
GFR: 95.43 mL/min (ref >60.00)
Glucose, Bld: 92 mg/dL (ref 70–99)
Potassium: 4.6 mEq/L (ref 3.5–5.1)
Sodium: 148 mEq/L — ABNORMAL HIGH (ref 135–145)

## 2021-03-27 NOTE — Progress Notes (Signed)
Called and spoke with patient, advised of results/recommendations per Tammy Parrett NP.  She verbalized understanding.  Nothing further needed.

## 2021-04-05 ENCOUNTER — Telehealth: Payer: Self-pay | Admitting: Pulmonary Disease

## 2021-04-05 MED ORDER — TRELEGY ELLIPTA 200-62.5-25 MCG/INH IN AEPB: 1.0000 | INHALATION_SPRAY | Freq: Every day | RESPIRATORY_TRACT | 11 refills | Status: DC

## 2021-04-05 NOTE — Telephone Encounter (Signed)
Called and spoke with patient who requesting a refill on her Trelegy inhaler. She verified preferred pharmacy to send prescription. RX has been sent. Nothing further needed at this time.

## 2021-04-12 ENCOUNTER — Other Ambulatory Visit: Payer: Self-pay | Admitting: Adult Health

## 2021-04-13 ENCOUNTER — Other Ambulatory Visit (INDEPENDENT_AMBULATORY_CARE_PROVIDER_SITE_OTHER): Payer: Medicare HMO

## 2021-04-13 ENCOUNTER — Ambulatory Visit (INDEPENDENT_AMBULATORY_CARE_PROVIDER_SITE_OTHER): Payer: Medicare HMO | Admitting: Internal Medicine

## 2021-04-13 ENCOUNTER — Encounter: Payer: Self-pay | Admitting: Internal Medicine

## 2021-04-13 ENCOUNTER — Other Ambulatory Visit: Payer: Self-pay

## 2021-04-13 VITALS — BP 120/64 | HR 70 | Ht 61.0 in | Wt 171.0 lb

## 2021-04-13 DIAGNOSIS — E1165 Type 2 diabetes mellitus with hyperglycemia: Secondary | ICD-10-CM

## 2021-04-13 DIAGNOSIS — E559 Vitamin D deficiency, unspecified: Secondary | ICD-10-CM

## 2021-04-13 DIAGNOSIS — E785 Hyperlipidemia, unspecified: Secondary | ICD-10-CM | POA: Diagnosis not present

## 2021-04-13 DIAGNOSIS — E538 Deficiency of other specified B group vitamins: Secondary | ICD-10-CM

## 2021-04-13 DIAGNOSIS — Z0001 Encounter for general adult medical examination with abnormal findings: Secondary | ICD-10-CM

## 2021-04-13 DIAGNOSIS — I700 Atherosclerosis of aorta: Secondary | ICD-10-CM | POA: Insufficient documentation

## 2021-04-13 DIAGNOSIS — A5901 Trichomonal vulvovaginitis: Secondary | ICD-10-CM | POA: Insufficient documentation

## 2021-04-13 DIAGNOSIS — R928 Other abnormal and inconclusive findings on diagnostic imaging of breast: Secondary | ICD-10-CM | POA: Insufficient documentation

## 2021-04-13 DIAGNOSIS — N898 Other specified noninflammatory disorders of vagina: Secondary | ICD-10-CM | POA: Insufficient documentation

## 2021-04-13 DIAGNOSIS — I10 Essential (primary) hypertension: Secondary | ICD-10-CM | POA: Diagnosis not present

## 2021-04-13 LAB — URINALYSIS, ROUTINE W REFLEX MICROSCOPIC
Bilirubin Urine: NEGATIVE
Hgb urine dipstick: NEGATIVE
Ketones, ur: NEGATIVE
Nitrite: NEGATIVE
RBC / HPF: NONE SEEN (ref 0–?)
Specific Gravity, Urine: <=1.005 — AB (ref 1.000–1.030)
Total Protein, Urine: NEGATIVE
Urine Glucose: NEGATIVE
Urobilinogen, UA: 0.2 (ref 0.0–1.0)
pH: 6 (ref 5.0–8.0)

## 2021-04-13 LAB — BASIC METABOLIC PANEL
BUN: 11 mg/dL (ref 6–23)
CO2: 40 mEq/L — ABNORMAL HIGH (ref 19–32)
Calcium: 9.5 mg/dL (ref 8.4–10.5)
Chloride: 95 mEq/L — ABNORMAL LOW (ref 96–112)
Creatinine, Ser: 0.42 mg/dL (ref 0.40–1.20)
GFR: 102.68 mL/min (ref >60.00)
Glucose, Bld: 90 mg/dL (ref 70–99)
Potassium: 3.6 mEq/L (ref 3.5–5.1)
Sodium: 142 mEq/L (ref 135–145)

## 2021-04-13 LAB — LDL CHOLESTEROL, DIRECT: Direct LDL: 67 mg/dL

## 2021-04-13 LAB — CBC WITH DIFFERENTIAL/PLATELET
Basophils Absolute: 0.1 10*3/uL (ref 0.0–0.1)
Basophils Relative: 1.1 % (ref 0.0–3.0)
Eosinophils Absolute: 0.1 10*3/uL (ref 0.0–0.7)
Eosinophils Relative: 1.6 % (ref 0.0–5.0)
HCT: 41.9 % (ref 36.0–46.0)
Hemoglobin: 13.9 g/dL (ref 12.0–15.0)
Lymphocytes Relative: 32.8 % (ref 12.0–46.0)
Lymphs Abs: 2.6 10*3/uL (ref 0.7–4.0)
MCHC: 33.1 g/dL (ref 30.0–36.0)
MCV: 86 fl (ref 78.0–100.0)
Monocytes Absolute: 0.6 10*3/uL (ref 0.1–1.0)
Monocytes Relative: 7.2 % (ref 3.0–12.0)
Neutro Abs: 4.5 10*3/uL (ref 1.4–7.7)
Neutrophils Relative %: 57.3 % (ref 43.0–77.0)
Platelets: 161 10*3/uL (ref 150.0–400.0)
RBC: 4.88 Mil/uL (ref 3.87–5.11)
RDW: 13.5 % (ref 11.5–15.5)
WBC: 7.8 10*3/uL (ref 4.0–10.5)

## 2021-04-13 LAB — MICROALBUMIN / CREATININE URINE RATIO
Creatinine,U: 60.8 mg/dL
Microalb Creat Ratio: 5.6 mg/g (ref 0.0–30.0)
Microalb, Ur: 3.4 mg/dL — ABNORMAL HIGH (ref 0.0–1.9)

## 2021-04-13 LAB — LIPID PANEL
Cholesterol: 156 mg/dL (ref 0–200)
HDL: 45.7 mg/dL (ref >39.00)
NonHDL: 110.64
Total CHOL/HDL Ratio: 3
Triglycerides: 255 mg/dL — ABNORMAL HIGH (ref 0.0–149.0)
VLDL: 51 mg/dL — ABNORMAL HIGH (ref 0.0–40.0)

## 2021-04-13 LAB — HEPATIC FUNCTION PANEL
ALT: 25 U/L (ref 0–35)
AST: 16 U/L (ref 0–37)
Albumin: 4.3 g/dL (ref 3.5–5.2)
Alkaline Phosphatase: 81 U/L (ref 39–117)
Bilirubin, Direct: 0.1 mg/dL (ref 0.0–0.3)
Total Bilirubin: 0.6 mg/dL (ref 0.2–1.2)
Total Protein: 6.9 g/dL (ref 6.0–8.3)

## 2021-04-13 LAB — TSH: TSH: 2.81 u[IU]/mL (ref 0.35–4.50)

## 2021-04-13 LAB — VITAMIN D 25 HYDROXY (VIT D DEFICIENCY, FRACTURES): VITD: 23.31 ng/mL — ABNORMAL LOW (ref 30.00–100.00)

## 2021-04-13 LAB — VITAMIN B12: Vitamin B-12: 564 pg/mL (ref 211–911)

## 2021-04-13 LAB — HEMOGLOBIN A1C: Hgb A1c MFr Bld: 6.3 % (ref 4.6–6.5)

## 2021-04-13 NOTE — Progress Notes (Signed)
Patient ID: CONSANDRA LASKE, female   DOB: [birth year], 65 y.o.   MRN: [id number]         Chief Complaint:: wellness exam and low vit d, aortic ahterosclerosis, dm, htn, hld       HPI:  [name] is a 65 y.o. female here for wellness exam; pt states will call for eye exam, pap and mammogram, declines dxa, covid vax, shingrix, tdap and pneumovax; o/w up to date with preventive referrals and immunizations                        Also not taking vit d.  Pt denies chest pain, increased sob or doe, wheezing, orthopnea, PND, increased LE swelling, palpitations, dizziness or syncope.   Pt denies polydipsia, polyuria, or new focal neuro s/s.   Pt denies fever, wt loss, night sweats, loss of appetite, or other constitutional symptoms   No other new complaints   Wt Readings from Last 3 Encounters:  04/13/21 171 lb (77.6 kg)  03/21/21 169 lb (76.7 kg)  03/06/21 169 lb (76.7 kg)   BP Readings from Last 3 Encounters:  04/13/21 120/64  03/21/21 (!) 100/50  03/06/21 110/60   Immunization History  Administered Date(s) Administered   Influenza Split 07/08/2013   Influenza,inj,Quad PF,6+ Mos 07/31/2015, 09/03/2017   PFIZER Comirnaty(Gray Top)Covid-19 Tri-Sucrose Vaccine 08/31/2020   PFIZER(Purple Top)SARS-COV-2 Vaccination 01/01/2020, 01/25/2020   Pneumococcal Polysaccharide-23 02/02/2014, 05/03/2019   Health Maintenance Due  Topic Date Due   MAMMOGRAM  07/22/2019      Past Medical History:  Diagnosis Date   Bronchitis    CAD (coronary artery disease)    a. 02/2002: CABG x3 with LIMA to LAD, RIMA to RCA, and SVG to OM   CHF (congestive heart failure) ([place])    COPD (chronic obstructive pulmonary disease) ([place])    Diabetes ([place])    Headache    Heart disease    Hyperlipidemia    Hypertension    Impaired glucose tolerance 08/20/2014   Sinusitis    Past Surgical History:  Procedure Laterality Date   CORONARY ARTERY BYPASS GRAFT  2003   Triple bypass   ESOPHAGOGASTRODUODENOSCOPY Left  06/13/2014   Procedure: ESOPHAGOGASTRODUODENOSCOPY (EGD);  Surgeon: Juanita Craver, MD;  [facility] ENDOSCOPY;  Service: Endoscopy;  Laterality: Left;   RIGID ESOPHAGOSCOPY N/A 06/12/2014   Procedure: RIGID ESOPHAGOSCOPY WITH FOREIGN BODY REMOVAL;  Surgeon: Ascencion Dike, MD;  Location: [facility] OR;  Service: ENT;  Laterality: N/A;    reports that she quit smoking about 22 months ago. Her smoking use included cigarettes. She started smoking about 49 years ago. She has a 23.50 pack-year smoking history. She has never used smokeless tobacco. She reports current alcohol use of about 5.0 - 6.0 standard drinks of alcohol per week. She reports previous drug use. Drug: Marijuana. family history includes Brain cancer in her mother; Cancer in her maternal grandmother and another family member; Heart disease in her mother; Lung cancer in her maternal aunt and paternal uncle; Stroke in an other family member; Suicidality in her brother. Allergies  Allergen Reactions   Augmentin [Amoxicillin-Pot Clavulanate] Nausea And Vomiting   Current Outpatient Medications on File Prior to Visit  Medication Sig Dispense Refill   albuterol (VENTOLIN HFA) 108 (90 Base) MCG/ACT inhaler INHALE 1-2 PUFFS BY MOUTH EVERY 6 HOURS AS NEEDED FOR WHEEZE OR SHORTNESS OF BREATH 8.5 each 2   amLODipine (NORVASC) 5 MG tablet Take 1 tablet (5 mg total) by  mouth daily. Please make yearly appt with Dr. Marlou Porch for April 2022 before anymore refills. Thank you 1st attempt 90 tablet 0   atorvastatin (LIPITOR) 40 MG tablet TAKE 1 TABLET BY MOUTH EVERY DAY 90 tablet 3   carvedilol (COREG) 12.5 MG tablet TAKE 1 TABLET BY MOUTH TWICE A DAY 180 tablet 3   cholecalciferol (VITAMIN D) 1000 units tablet Take 1,000 Units by mouth daily.     Dulaglutide (TRULICITY) 0.62 IR/4.8NI SOPN Inject 0.75 mg into the skin once a week. 6 mL 3   Fluticasone-Umeclidin-Vilant (TRELEGY ELLIPTA) 200-62.5-25 MCG/INH AEPB Inhale 1 puff into the lungs daily. 14 each 0    Fluticasone-Umeclidin-Vilant (TRELEGY ELLIPTA) 200-62.5-25 MCG/INH AEPB Inhale 1 puff into the lungs daily. 1 each 11   furosemide (LASIX) 40 MG tablet TAKE 1 TABLET BY MOUTH TWICE A DAY 180 tablet 0   ipratropium-albuterol (DUONEB) 0.5-2.5 (3) MG/3ML SOLN TAKE 3 MLS BY NEBULIZATION EVERY 6 (SIX) HOURS AS NEEDED. 360 mL 5   isosorbide mononitrate (IMDUR) 60 MG 24 hr tablet TAKE 1.5 TABLETS BY MOUTH DAILY. PLEASE MAKE A YEARLY APPT WITH DR. Marlou Porch FOR APRIL 2022 135 tablet 2   KLOR-CON M10 10 MEQ tablet TAKE 1 TABLET BY MOUTH EVERY DAY 90 tablet 0   Melatonin 10 MG SUBL Place 1 each under the tongue as needed.     metFORMIN (GLUCOPHAGE-XR) 500 MG 24 hr tablet TAKE 1 TABLET BY MOUTH EVERY DAY WITH BREAKFAST 90 tablet 3   Multiple Vitamin (MULTIVITAMIN WITH MINERALS) TABS tablet Take 1 tablet by mouth daily.     vitamin B-12 (CYANOCOBALAMIN) 1000 MCG tablet Take 1,000 mcg by mouth daily.     vitamin C (ASCORBIC ACID) 500 MG tablet Take 500 mg by mouth daily.      Vitamin D, Ergocalciferol, (DRISDOL) 1.25 MG (50000 UNIT) CAPS capsule Take 1 capsule (50,000 Units total) by mouth every 7 (seven) days. 12 capsule 0   No current facility-administered medications on file prior to visit.        ROS:  All others reviewed and negative.  Objective        PE:  BP 120/64 (BP Location: Left Arm, Patient Position: Sitting, Cuff Size: Large)   Pulse 70   Ht 5\' 1"  (1.549 m)   Wt 171 lb (77.6 kg)   SpO2 90%   BMI 32.31 kg/m                 Constitutional: Pt appears in NAD               HENT: Head: NCAT.                Right Ear: External ear normal.                 Left Ear: External ear normal.                Eyes: . Pupils are equal, round, and reactive to light. Conjunctivae and EOM are normal               Nose: without d/c or deformity               Neck: Neck supple. Gross normal ROM               Cardiovascular: Normal rate and regular rhythm.                 Pulmonary/Chest: Effort normal and  breath sounds without rales or  wheezing.                Abd:  Soft, NT, ND, + BS, no organomegaly               Neurological: Pt is alert. At baseline orientation, motor grossly intact               Skin: Skin is warm. No rashes, no other new lesions, LE edema - none               Psychiatric: Pt behavior is normal without agitation   Micro: none  Cardiac tracings I have personally interpreted today:  none  Pertinent Radiological findings (summarize): none   Lab Results  Component Value Date   WBC 7.8 04/13/2021   HGB 13.9 04/13/2021   HCT 41.9 04/13/2021   PLT 161.0 04/13/2021   GLUCOSE 90 04/13/2021   CHOL 156 04/13/2021   TRIG 255.0 (H) 04/13/2021   HDL 45.70 04/13/2021   LDLDIRECT 67.0 04/13/2021   LDLCALC 68 02/02/2014   ALT 25 04/13/2021   AST 16 04/13/2021   NA 142 04/13/2021   K 3.6 04/13/2021   CL 95 (L) 04/13/2021   CREATININE 0.42 04/13/2021   BUN 11 04/13/2021   CO2 40 (H) 04/13/2021   TSH 2.81 04/13/2021   HGBA1C 6.3 04/13/2021   MICROALBUR 3.4 (H) 04/13/2021   Assessment/Plan:  ALDEEN RIGA is a 65 y.o. Other or two or more races [6] female with  has a past medical history of Bronchitis, CAD (coronary artery disease), CHF (congestive heart failure) ([place]), COPD (chronic obstructive pulmonary disease) ([place]), Diabetes ([place]), Headache, Heart disease, Hyperlipidemia, Hypertension, Impaired glucose tolerance (08/20/2014), and Sinusitis.  Vitamin D deficiency Last vitamin D Lab Results  Component Value Date   VD25OH 24.67 (L) 03/31/2020   Low, to start oral replacement   Encounter for well adult exam with abnormal findings Age and sex appropriate education and counseling updated with regular exercise and diet Referrals for preventative services - decliens dxa, and will call for her eye exam, pap and mammogram Immunizations addressed - deciens covid vax, shingrix, tdap and pneumovax Smoking counseling  - none needed Evidence for depression or other mood  disorder - none significant Most recent labs reviewed. I have personally reviewed and have noted: 1) the patient's medical and social history 2) The patient's current medications and supplements 3) The patient's height, weight, and BMI have been recorded in the chart   Aortic atherosclerosis ([place]) To continue low chol diet, exercise, lipitor 40  Diabetes ([place]) Lab Results  Component Value Date   HGBA1C 6.3 04/13/2021   Stable, pt to continue current medical treatment trulicity, metformin   Essential hypertension BP Readings from Last 3 Encounters:  04/13/21 120/64  03/21/21 (!) 100/50  03/06/21 110/60   Stable, pt to continue medical treatment norvasc, coreg   HLD (hyperlipidemia) Lab Results  Component Value Date   LDLCALC 68 02/02/2014   Stable, pt to continue current statin lipitor 40  Followup: Return in about 6 months (around 10/13/2021).  Cathlean Cower, MD 04/14/2021 4:42 PM [facility]

## 2021-04-13 NOTE — Patient Instructions (Addendum)
Please remember to see Dr Katy Fitch for your yearly eye exam  Please continue all other medications as before, and refills have been done if requested.  Please have the pharmacy call with any other refills you may need.  Please continue your efforts at being more active, low cholesterol diet, and weight control.  You are otherwise up to date with prevention measures today.  Please keep your appointments with your specialists as you may have planned  Please go to the LAB at the blood drawing area for the tests to be done  You will be contacted by phone if any changes need to be made immediately.  Otherwise, you will receive a letter about your results with an explanation, but please check with MyChart first.  Please remember to sign up for MyChart if you have not done so, as this will be important to you in the future with finding out test results, communicating by private email, and scheduling acute appointments online when needed.  Please make an Appointment to return in 6 months, or sooner if needed

## 2021-04-13 NOTE — Assessment & Plan Note (Signed)
Last vitamin D Lab Results  Component Value Date   VD25OH 24.67 (L) 03/31/2020   Low, to start oral replacement

## 2021-04-14 ENCOUNTER — Encounter: Payer: Self-pay | Admitting: Internal Medicine

## 2021-04-14 DIAGNOSIS — J449 Chronic obstructive pulmonary disease, unspecified: Secondary | ICD-10-CM | POA: Diagnosis not present

## 2021-04-14 NOTE — Assessment & Plan Note (Signed)
Lab Results  Component Value Date   LDLCALC 68 02/02/2014   Stable, pt to continue current statin lipitor 40

## 2021-04-14 NOTE — Assessment & Plan Note (Signed)
Lab Results  Component Value Date   HGBA1C 6.3 04/13/2021   Stable, pt to continue current medical treatment trulicity, metformin

## 2021-04-14 NOTE — Assessment & Plan Note (Signed)
Age and sex appropriate education and counseling updated with regular exercise and diet Referrals for preventative services - decliens dxa, and will call for her eye exam, pap and mammogram Immunizations addressed - deciens covid vax, shingrix, tdap and pneumovax Smoking counseling  - none needed Evidence for depression or other mood disorder - none significant Most recent labs reviewed. I have personally reviewed and have noted: 1) the patient's medical and social history 2) The patient's current medications and supplements 3) The patient's height, weight, and BMI have been recorded in the chart

## 2021-04-14 NOTE — Assessment & Plan Note (Signed)
To continue low chol diet, exercise, lipitor 40

## 2021-04-14 NOTE — Assessment & Plan Note (Signed)
BP Readings from Last 3 Encounters:  04/13/21 120/64  03/21/21 (!) 100/50  03/06/21 110/60   Stable, pt to continue medical treatment norvasc, coreg

## 2021-04-24 ENCOUNTER — Other Ambulatory Visit: Payer: Self-pay

## 2021-04-24 ENCOUNTER — Ambulatory Visit
Admission: RE | Admit: 2021-04-24 | Discharge: 2021-04-24 | Disposition: A | Discharge status: Home / Self Care | Payer: Medicare HMO | Source: Ambulatory Visit | Attending: Internal Medicine | Admitting: Internal Medicine

## 2021-04-24 DIAGNOSIS — Z1231 Encounter for screening mammogram for malignant neoplasm of breast: Secondary | ICD-10-CM | POA: Diagnosis not present

## 2021-04-30 ENCOUNTER — Encounter: Payer: Self-pay | Admitting: Internal Medicine

## 2021-05-01 ENCOUNTER — Encounter: Payer: Self-pay | Admitting: Family Medicine

## 2021-05-01 ENCOUNTER — Other Ambulatory Visit: Payer: Self-pay

## 2021-05-01 ENCOUNTER — Telehealth (INDEPENDENT_AMBULATORY_CARE_PROVIDER_SITE_OTHER): Payer: Medicare HMO | Admitting: Family Medicine

## 2021-05-01 VITALS — BP 133/65 | Temp 99.6°F

## 2021-05-01 DIAGNOSIS — R0981 Nasal congestion: Secondary | ICD-10-CM

## 2021-05-01 MED ORDER — BENZONATATE 100 MG PO CAPS: 100.0000 mg | ORAL_CAPSULE | Freq: Three times a day (TID) | ORAL | 0 refills | Status: DC | PRN

## 2021-05-01 NOTE — Progress Notes (Signed)
Virtual Visit via Telephone Note  I connected with [name] on 05/01/21 at 12:00 PM EDT by telephone and verified that I am speaking with the correct person using two identifiers.   I discussed the limitations, risks, security and privacy concerns of performing an evaluation and management service by telephone and the availability of in person appointments. I also discussed with the patient that there may be a patient responsible charge related to this service. The patient expressed understanding and agreed to proceed.  Location patient: home, [place] Location provider: work or home office Participants present for the call: patient, provider Patient did not have a visit with me in the prior 7 days to address this/these issue(s).   History of Present Illness:  Acute telemedicine visit for sinus issues: -Onset: 4 days ago -Symptoms include: nasal congestion, ear pain at times bilat, sinus headache at times, sore throat, mild cough, low grade fever initially, sneezing, itchy eyes -Denies:CP, wheezing, excessive mucus, SOB changed from baseline, NVD, inability to eat/drink/get out of bed -Pertinent past medical history:see below -Pertinent medication allergies:  Allergies  Allergen Reactions   Augmentin [Amoxicillin-Pot Clavulanate] Nausea And Vomiting  -COVID-19 vaccine status:vaccinated x2 + booster; also had flu shot  Past Medical History:  Diagnosis Date   Bronchitis    CAD (coronary artery disease)    a. 02/2002: CABG x3 with LIMA to LAD, RIMA to RCA, and SVG to OM   CHF (congestive heart failure) (HCC)    COPD (chronic obstructive pulmonary disease) ([place])    Diabetes ([place])    Headache    Heart disease    Hyperlipidemia    Hypertension    Impaired glucose tolerance 08/20/2014   Sinusitis      Observations/Objective: Patient sounds cheerful and well on the phone. I do not appreciate any SOB. Speech and thought processing are grossly intact. Patient reported  vitals:  Assessment and Plan:  Nasal congestion  -we discussed possible serious and likely etiologies, options for evaluation and workup, limitations of telemedicine visit vs in person visit, treatment, treatment risks and precautions. Pt prefers to treat via telemedicine empirically rather than in person at this moment.  Query viral upper respiratory illness, COVID-19, possible seasonal allergies, versus other.  She reports she does not have any breathing issues, besides her baseline, and that her cough is not very bad.  She has opted for COVID testing, which she agrees to pursue on her own, nasal saline, Flonase which she reports she has at home and [facility] Rx sent for cough.  Did advise if she has a positive COVID test that she schedule follow-up video visit promptly to discuss treatment options. Advised to seek prompt in person care if worsening, new symptoms arise, or if is not improving with treatment. Advised of options for inperson care in case PCP office not available. Did let the patient know that I only do telemedicine shifts for [facility] on Tuesdays and Thursdays and advised a follow up visit with PCP or at an [facility] if has further questions or concerns.   Follow Up Instructions:  I did not refer this patient for an OV with me in the next 24 hours for this/these issue(s).  I discussed the assessment and treatment plan with the patient. The patient was provided an opportunity to ask questions and all were answered. The patient agreed with the plan and demonstrated an understanding of the instructions.   I spent 11 minutes on the date of this visit in the care of this  patient. See summary of tasks completed to properly care for this patient in the detailed notes above which also included counseling of above, review of PMH, medications, allergies, evaluation of the patient and ordering and/or  instructing patient on testing and care options.     Lucretia Kern, DO

## 2021-05-01 NOTE — Patient Instructions (Addendum)
  HOME CARE TIPS:  -[place] testing information: [email or web address]/ OR [phone] Most pharmacies also offer testing and home test kits. If the Covid19 test is positive, please make a prompt follow up visit with your primary care office or with [facility] to discuss treatment options. Treatments for Covid19 are best given early in the course of the illness.   -I sent the medication(s) we discussed to your pharmacy: Meds ordered this encounter  Medications   benzonatate (TESSALON PERLES) 100 MG capsule    Sig: Take 1 capsule (100 mg total) by mouth 3 (three) times daily as needed.    Dispense:  20 capsule    Refill:  0     -can use nasal saline a few times per day if you have nasal congestion  -stay hydrated, drink plenty of fluids and eat small healthy meals - avoid dairy  -If the Covid test is positive, check out the [facility] website for more information on home care, transmission and treatment for COVID19  -follow up with your doctor in 2-3 days unless improving and feeling better  -stay home while sick, except to seek medical care. If you have COVID19, ideally it would be best to stay home for a full 10 days since the onset of symptoms PLUS one day of no fever and feeling better. Wear a good mask that fits snugly (such as N95 or KN95) if around others to reduce the risk of transmission.  It was nice to meet you today, and I really hope you are feeling better soon. I help [facility] out with telemedicine visits on Tuesdays and Thursdays and am available for visits on those days. If you have any concerns or questions following this visit please schedule a follow up visit with your Primary Care doctor or seek care at a local urgent care clinic to avoid delays in care.    Seek in person care or schedule a follow up video visit promptly if your symptoms worsen, new concerns arise or you are not improving with treatment. Call 911 and/or seek  emergency care if your symptoms are severe or life threatening.

## 2021-05-02 NOTE — Telephone Encounter (Signed)
Called pt to schedule OV. OV scheduled for 05/07/21.

## 2021-05-07 ENCOUNTER — Ambulatory Visit (INDEPENDENT_AMBULATORY_CARE_PROVIDER_SITE_OTHER): Payer: Medicare HMO | Admitting: Internal Medicine

## 2021-05-07 ENCOUNTER — Encounter: Payer: Self-pay | Admitting: Internal Medicine

## 2021-05-07 ENCOUNTER — Other Ambulatory Visit: Payer: Self-pay

## 2021-05-07 VITALS — BP 120/62 | HR 67 | Temp 99.1°F | Ht 61.0 in | Wt 167.6 lb

## 2021-05-07 DIAGNOSIS — J069 Acute upper respiratory infection, unspecified: Secondary | ICD-10-CM

## 2021-05-07 DIAGNOSIS — J309 Allergic rhinitis, unspecified: Secondary | ICD-10-CM

## 2021-05-07 DIAGNOSIS — E1165 Type 2 diabetes mellitus with hyperglycemia: Secondary | ICD-10-CM

## 2021-05-07 DIAGNOSIS — E559 Vitamin D deficiency, unspecified: Secondary | ICD-10-CM

## 2021-05-07 MED ORDER — LEVOFLOXACIN 500 MG PO TABS: 500.0000 mg | ORAL_TABLET | Freq: Every day | ORAL | 0 refills | Status: AC

## 2021-05-07 MED ORDER — PREDNISONE 10 MG PO TABS: ORAL_TABLET | ORAL | 0 refills | Status: DC

## 2021-05-07 MED ORDER — HYDROCODONE BIT-HOMATROP MBR 5-1.5 MG/5ML PO SOLN: 5.0000 mL | Freq: Four times a day (QID) | ORAL | 0 refills | Status: AC | PRN

## 2021-05-07 NOTE — Assessment & Plan Note (Signed)
Last vitamin D Lab Results  Component Value Date   VD25OH 23.31 (L) 04/13/2021   Low, to start oral replacement

## 2021-05-07 NOTE — Assessment & Plan Note (Signed)
Mild to mod, for predpac asd,,  to f/u any worsening symptoms or concerns 

## 2021-05-07 NOTE — Progress Notes (Signed)
Patient ID: [name], female   DOB: May 19, 1956, 65 y.o.   MRN: [id number]        Chief Complaint: follow up uri, allergies       HPI:  [name] is a 65 y.o. female here with c/o  Here with 2-3 days acute onset fever, facial pain, pressure, headache, general weakness and malaise, and greenish d/c, with mild ST and cough, but pt denies chest pain, wheezing, increased sob or doe, orthopnea, PND, increased LE swelling, palpitations, dizziness or syncope.  Does have several wks ongoing nasal allergy symptoms with clearish congestion, itch and sneezing, without fever, pain, ST, cough, swelling or wheezing.  Home tested COVID neg yesterady   Pt denies polydipsia, polyuria, Not taking Vit D       Wt Readings from Last 3 Encounters:  05/07/21 167 lb 9.6 oz (76 kg)  04/13/21 171 lb (77.6 kg)  03/21/21 169 lb (76.7 kg)   BP Readings from Last 3 Encounters:  05/07/21 120/62  05/01/21 133/65  04/13/21 120/64         Past Medical History:  Diagnosis Date   Bronchitis    CAD (coronary artery disease)    a. 02/2002: CABG x3 with LIMA to LAD, RIMA to RCA, and SVG to OM   CHF (congestive heart failure) ([place])    COPD (chronic obstructive pulmonary disease) ([place])    Diabetes ([place])    Headache    Heart disease    Hyperlipidemia    Hypertension    Impaired glucose tolerance 08/20/2014   Sinusitis    Past Surgical History:  Procedure Laterality Date   CORONARY ARTERY BYPASS GRAFT  2003   Triple bypass   ESOPHAGOGASTRODUODENOSCOPY Left 06/13/2014   Procedure: ESOPHAGOGASTRODUODENOSCOPY (EGD);  Surgeon: Juanita Craver, MD;  Location: [facility] ENDOSCOPY;  Service: Endoscopy;  Laterality: Left;   RIGID ESOPHAGOSCOPY N/A 06/12/2014   Procedure: RIGID ESOPHAGOSCOPY WITH FOREIGN BODY REMOVAL;  Surgeon: Ascencion Dike, MD;  Location[facility] OR;  Service: ENT;  Laterality: N/A;    reports that she quit smoking about 1 years ago. Her smoking use included cigarettes. She started smoking about 49 years ago. She has  a 23.50 pack-year smoking history. She has never used smokeless tobacco. She reports current alcohol use of about 5.0 - 6.0 standard drinks of alcohol per week. She reports previous drug use. Drug: Marijuana. family history includes Brain cancer in her mother; Cancer in her maternal grandmother and another family member; Heart disease in her mother; Lung cancer in her maternal aunt and paternal uncle; Stroke in an other family member; Suicidality in her brother. Allergies  Allergen Reactions   Augmentin [Amoxicillin-Pot Clavulanate] Nausea And Vomiting   Current Outpatient Medications on File Prior to Visit  Medication Sig Dispense Refill   albuterol (VENTOLIN HFA) 108 (90 Base) MCG/ACT inhaler INHALE 1-2 PUFFS BY MOUTH EVERY 6 HOURS AS NEEDED FOR WHEEZE OR SHORTNESS OF BREATH 8.5 each 2   amLODipine (NORVASC) 5 MG tablet Take 1 tablet (5 mg total) by mouth daily. Please make yearly appt with Dr. Marlou Porch for April 2022 before anymore refills. Thank you 1st attempt 90 tablet 0   atorvastatin (LIPITOR) 40 MG tablet TAKE 1 TABLET BY MOUTH EVERY DAY 90 tablet 3   benzonatate (TESSALON PERLES) 100 MG capsule Take 1 capsule (100 mg total) by mouth 3 (three) times daily as needed. 20 capsule 0   carvedilol (COREG) 12.5 MG tablet TAKE 1 TABLET BY MOUTH TWICE A DAY 180 tablet  3   cholecalciferol (VITAMIN D) 1000 units tablet Take 1,000 Units by mouth daily.     Dulaglutide (TRULICITY) 3.47 QQ/5.9DG SOPN Inject 0.75 mg into the skin once a week. 6 mL 3   Fluticasone-Umeclidin-Vilant (TRELEGY ELLIPTA) 200-62.5-25 MCG/INH AEPB Inhale 1 puff into the lungs daily. 14 each 0   Fluticasone-Umeclidin-Vilant (TRELEGY ELLIPTA) 200-62.5-25 MCG/INH AEPB Inhale 1 puff into the lungs daily. 1 each 11   furosemide (LASIX) 40 MG tablet TAKE 1 TABLET BY MOUTH TWICE A DAY 180 tablet 0   ipratropium-albuterol (DUONEB) 0.5-2.5 (3) MG/3ML SOLN TAKE 3 MLS BY NEBULIZATION EVERY 6 (SIX) HOURS AS NEEDED. 360 mL 5   isosorbide  mononitrate (IMDUR) 60 MG 24 hr tablet TAKE 1.5 TABLETS BY MOUTH DAILY. PLEASE MAKE A YEARLY APPT WITH DR. Marlou Porch FOR APRIL 2022 135 tablet 2   KLOR-CON M10 10 MEQ tablet TAKE 1 TABLET BY MOUTH EVERY DAY 90 tablet 0   Melatonin 10 MG SUBL Place 1 each under the tongue as needed.     metFORMIN (GLUCOPHAGE-XR) 500 MG 24 hr tablet TAKE 1 TABLET BY MOUTH EVERY DAY WITH BREAKFAST 90 tablet 3   Multiple Vitamin (MULTIVITAMIN WITH MINERALS) TABS tablet Take 1 tablet by mouth daily.     vitamin B-12 (CYANOCOBALAMIN) 1000 MCG tablet Take 1,000 mcg by mouth daily.     vitamin C (ASCORBIC ACID) 500 MG tablet Take 500 mg by mouth daily.      Vitamin D, Ergocalciferol, (DRISDOL) 1.25 MG (50000 UNIT) CAPS capsule Take 1 capsule (50,000 Units total) by mouth every 7 (seven) days. 12 capsule 0   No current facility-administered medications on file prior to visit.        ROS:  All others reviewed and negative.  Objective        PE:  BP 120/62 (BP Location: Right Arm, Patient Position: Sitting, Cuff Size: Normal)   Pulse 67   Temp 99.1 F (37.3 C) (Oral)   Ht 5\' 1"  (1.549 m)   Wt 167 lb 9.6 oz (76 kg)   SpO2 97%   BMI 31.67 kg/m                 Constitutional: Pt appears in NAD               HENT: Head: NCAT.                Right Ear: External ear normal.                 Left Ear: External ear normal.                Eyes: . Pupils are equal, round, and reactive to light. Conjunctivae and EOM are normal               Nose: without d/c or deformity; Bilat tm's with mild erythema.  Max sinus areas mild tender.  Pharynx with mild erythema, no exudate               Neck: Neck supple. Gross normal ROM               Cardiovascular: Normal rate and regular rhythm.                 Pulmonary/Chest: Effort normal and breath sounds without rales or wheezing.                Abd:  Soft, NT, ND, + BS, no organomegaly  Neurological: Pt is alert. At baseline orientation, motor grossly intact                Skin: Skin is warm. No rashes, no other new lesions, LE edema - none               Psychiatric: Pt behavior is normal without agitation   Micro: none  Cardiac tracings I have personally interpreted today:  none  Pertinent Radiological findings (summarize): none   Lab Results  Component Value Date   WBC 7.8 04/13/2021   HGB 13.9 04/13/2021   HCT 41.9 04/13/2021   PLT 161.0 04/13/2021   GLUCOSE 90 04/13/2021   CHOL 156 04/13/2021   TRIG 255.0 (H) 04/13/2021   HDL 45.70 04/13/2021   LDLDIRECT 67.0 04/13/2021   LDLCALC 68 02/02/2014   ALT 25 04/13/2021   AST 16 04/13/2021   NA 142 04/13/2021   K 3.6 04/13/2021   CL 95 (L) 04/13/2021   CREATININE 0.42 04/13/2021   BUN 11 04/13/2021   CO2 40 (H) 04/13/2021   TSH 2.81 04/13/2021   HGBA1C 6.3 04/13/2021   MICROALBUR 3.4 (H) 04/13/2021   Assessment/Plan:  [name] is a 65 y.o. Other or two or more races [6] female with  has a past medical history of Bronchitis, CAD (coronary artery disease), CHF (congestive heart failure) ([place]), COPD (chronic obstructive pulmonary disease) ([place]), Diabetes ([place]), Headache, Heart disease, Hyperlipidemia, Hypertension, Impaired glucose tolerance (08/20/2014), and Sinusitis.  URI (upper respiratory infection) Mild to mod, for antibx course,  to f/u any worsening symptoms or concerns  Allergic rhinitis Mild to mod, for predpac asd,,  to f/u any worsening symptoms or concerns  Diabetes [facility]) Lab Results  Component Value Date   HGBA1C 6.3 04/13/2021   Stable, pt to continue current medical treatment trulicity, metformin   Vitamin D deficiency Last vitamin D Lab Results  Component Value Date   VD25OH 23.31 (L) 04/13/2021   Low, to start oral replacement  Followup: Return if symptoms worsen or fail to improve.  Cathlean Cower, MD 05/07/2021 8:23 PM [facility]

## 2021-05-07 NOTE — Assessment & Plan Note (Signed)
Lab Results  Component Value Date   HGBA1C 6.3 04/13/2021   Stable, pt to continue current medical treatment trulicity, metformin

## 2021-05-07 NOTE — Patient Instructions (Signed)
Please take all new medication as prescribed - the antibiotic, prednisone, and cough medicine as needed  Please continue all other medications as before, and refills have been done if requested.  Please have the pharmacy call with any other refills you may need.  Please keep your appointments with your specialists as you may have planned

## 2021-05-07 NOTE — Assessment & Plan Note (Signed)
Mild to mod, for antibx course,  to f/u any worsening symptoms or concerns 

## 2021-05-14 DIAGNOSIS — J449 Chronic obstructive pulmonary disease, unspecified: Secondary | ICD-10-CM | POA: Diagnosis not present

## 2021-05-24 ENCOUNTER — Telehealth: Payer: Self-pay | Admitting: Adult Health

## 2021-05-24 ENCOUNTER — Telehealth (INDEPENDENT_AMBULATORY_CARE_PROVIDER_SITE_OTHER): Payer: Medicare HMO | Admitting: Adult Health

## 2021-05-24 ENCOUNTER — Encounter: Payer: Self-pay | Admitting: Adult Health

## 2021-05-24 DIAGNOSIS — J9611 Chronic respiratory failure with hypoxia: Secondary | ICD-10-CM | POA: Diagnosis not present

## 2021-05-24 DIAGNOSIS — J449 Chronic obstructive pulmonary disease, unspecified: Secondary | ICD-10-CM

## 2021-05-24 NOTE — Patient Instructions (Signed)
Continue on TRELEGY 1 puff daily  May use Duoneb as needed.  Continue on Oxygen 3l/m .  Activity as tolerated.  Work on not smoking .  Follow up with Dr. Elsworth Soho  In 3 months and As needed   Please contact office for sooner follow up if symptoms do not improve or worsen or seek emergency care

## 2021-05-24 NOTE — Telephone Encounter (Signed)
Call returned to patient, confirmed DOB. Patient states she does not feel like coming into the office. She states she just feels fatigued. She said the heat is just too much for her. She denies that she has been in contact with anyone with covid. She states her oxygen has been low. I inquired as to what her oxygen levels have been, she states 96%. Denies any other symptoms.   Appt made a televisit. Nothing further needed at this time.

## 2021-05-24 NOTE — Progress Notes (Signed)
Virtual Visit via Video Note  I connected with [name] on 05/24/21 at  2:00 PM EDT by a video enabled telemedicine application and verified that I am speaking with the correct person using two identifiers.  Location: Patient: Home  Provider: Office    I discussed the limitations of evaluation and management by telemedicine and the availability of in person appointments. The patient expressed understanding and agreed to proceed.  History of Present Illness: 65 year old female former smoker followed for COPD and oxygen dependent respiratory failure.  She has mild tracheomalacia on CT scan Medical history significant for diabetes  Today's video visit is for a 35-monthfollow-up.  Patient has underlying very severe COPD, oxygen dependent.  Patient says overall she is doing okay.  She gets short of breath with increased activities.  And is unable to tolerate this high heat and humidity. She remains on Trelegy inhaler daily.  Uses her DuoNeb as needed.  She is on oxygen at 3 L.  Says that her O2 saturations have been doing well on 3 L.  She is working on cutting back smoking.  Smoking cessation encouraged. She denies any hemoptysis, chest pain, orthopnea.  Past Medical History:  Diagnosis Date   Bronchitis    CAD (coronary artery disease)    a. 02/2002: CABG x3 with LIMA to LAD, RIMA to RCA, and SVG to OM   CHF (congestive heart failure) (HCC)    COPD (chronic obstructive pulmonary disease) ([facility])    [place]    Headache    Heart disease    Hyperlipidemia    Hypertension    Impaired glucose tolerance 08/20/2014   Sinusitis    Current Outpatient Medications on File Prior to Visit  Medication Sig Dispense Refill   albuterol (VENTOLIN HFA) 108 (90 Base) MCG/ACT inhaler INHALE 1-2 PUFFS BY MOUTH EVERY 6 HOURS AS NEEDED FOR WHEEZE OR SHORTNESS OF BREATH 8.5 each 2   amLODipine (NORVASC) 5 MG tablet Take 1 tablet (5 mg total) by mouth daily. Please make yearly appt with Dr. SMarlou Porch for April 2022 before anymore refills. Thank you 1st attempt 90 tablet 0   atorvastatin (LIPITOR) 40 MG tablet TAKE 1 TABLET BY MOUTH EVERY DAY 90 tablet 3   carvedilol (COREG) 12.5 MG tablet TAKE 1 TABLET BY MOUTH TWICE A DAY 180 tablet 3   cholecalciferol (VITAMIN D) 1000 units tablet Take 1,000 Units by mouth daily.     Dulaglutide (TRULICITY) [id number] Inject 0.75 mg into the skin once a week. 6 mL 3   Fluticasone-Umeclidin-Vilant (TRELEGY ELLIPTA) 200-62.5-25 MCG/INH AEPB Inhale 1 puff into the lungs daily. 1 each 11   furosemide (LASIX) 40 MG tablet TAKE 1 TABLET BY MOUTH TWICE A DAY 180 tablet 0   ipratropium-albuterol (DUONEB) 0.5-2.5 (3) MG/3ML SOLN TAKE 3 MLS BY NEBULIZATION EVERY 6 (SIX) HOURS AS NEEDED. 360 mL 5   isosorbide mononitrate (IMDUR) 60 MG 24 hr tablet TAKE 1.5 TABLETS BY MOUTH DAILY. PLEASE MAKE A YEARLY APPT WITH DR. SMarlou PorchFOR APRIL 2022 135 tablet 2   KLOR-CON M10 10 MEQ tablet TAKE 1 TABLET BY MOUTH EVERY DAY 90 tablet 0   Melatonin 10 MG SUBL Place 1 each under the tongue as needed.     metFORMIN (GLUCOPHAGE-XR) 500 MG 24 hr tablet TAKE 1 TABLET BY MOUTH EVERY DAY WITH BREAKFAST 90 tablet 3   Multiple Vitamin (MULTIVITAMIN WITH MINERALS) TABS tablet Take 1 tablet by mouth daily.     vitamin B-12 (CYANOCOBALAMIN)  1000 MCG tablet Take 1,000 mcg by mouth daily.     vitamin C (ASCORBIC ACID) 500 MG tablet Take 500 mg by mouth daily.      Vitamin D, Ergocalciferol, (DRISDOL) 1.25 MG (50000 UNIT) CAPS capsule Take 1 capsule (50,000 Units total) by mouth every 7 (seven) days. 12 capsule 0   No current facility-administered medications on file prior to visit.      Observations/Objective: O2 sats currently 96% on 3l/m   CT angiogram chest 01/2020-emphysema, mild tracheomalacia   04/2019-pulmonary function test- FVC 1.05 (44% predicted), postbronchodilator ratio 47, postbronchodilator FEV1 0.47 (25% predicted), DLCO 54   Spirometry 02/2017 >> ratio of 59, FEV1 of  42% FVC of 56% ABG 01/2014 was 7.29/66/75/92% CT angiogram 11/2016 was negative for pulmonary embolism   08/2014 >> PSG neg for OSA , Desaturation as low as 85%   Assessment and Plan: very severe COPD.  Stable on current regimen  Oxygen pendant respiratory failure.  Stable on 3 L of oxygen  Tobacco abuse.  Work on smoking cessation  Plan  Patient Instructions  Continue on TRELEGY 1 puff daily  May use Duoneb as needed.  Continue on Oxygen 3l/m .  Activity as tolerated.  Work on not smoking .  Follow up with Dr. Elsworth Soho  In 3 months and As needed   Please contact office for sooner follow up if symptoms do not improve or worsen or seek emergency care     Follow Up Instructions:    I discussed the assessment and treatment plan with the patient. The patient was provided an opportunity to ask questions and all were answered. The patient agreed with the plan and demonstrated an understanding of the instructions.   The patient was advised to call back or seek an in-person evaluation if the symptoms worsen or if the condition fails to improve as anticipated.  I provided 24  minutes of non-face-to-face time during this encounter.   Rexene Edison, NP

## 2021-06-02 ENCOUNTER — Other Ambulatory Visit: Payer: Self-pay | Admitting: Internal Medicine

## 2021-06-04 MED ORDER — PREDNISONE 10 MG PO TABS: ORAL_TABLET | ORAL | 0 refills | Status: AC

## 2021-06-04 NOTE — Telephone Encounter (Signed)
Email from patient  Saturday morning i was having a room painted and the smell mad me sick. I. Became very short winded and my chest tightened up. I still feel sick grom it.    Called spoke with patient. She is short of breath oxygen on 3L is 93%, she is tight in her chest can't get a good deep breath. She is taking her albuterol.  Sending to Dr. Elsworth Soho for recommendations.

## 2021-06-04 NOTE — Telephone Encounter (Signed)
Use albuterol nebs  3-4 times/day  Prednisone 10 mg tabs  Take 2 tabs daily with food x 5ds, then 1 tab daily with food x 5ds then STOP   Called spoke with patient. Shared Dr. Bari Mantis recommendations with her. Verified pharmacy, orders placed.  Nothing further needed at this time.

## 2021-06-08 ENCOUNTER — Other Ambulatory Visit: Payer: Self-pay | Admitting: Pulmonary Disease

## 2021-06-08 ENCOUNTER — Telehealth: Payer: Self-pay | Admitting: Cardiology

## 2021-06-08 DIAGNOSIS — R072 Precordial pain: Secondary | ICD-10-CM

## 2021-06-08 NOTE — Telephone Encounter (Signed)
Spoke with the patient who states that she has been having intermittent chest tightness. She states that pain does not radiate. She does report headaches and shortness of breath although she is having a COPD exacerbation. She reports lightheadedness at times. She reports that chest tightness has just been more frequent than normal. She does not do anything that brings on the pain. She states that she will take a tylenol and lay down which helps with the pain and associated headaches. Patient has been taking Imdur every night. I have advised the patient to start taking it during the day to see if this helps with her chest pain. Patient has also been advised on ER precautions.

## 2021-06-08 NOTE — Telephone Encounter (Signed)
  Per patient scheduling request, patient requesting appt for chest tightness:  I don't have any nitro. My chest feel tight on and off and the pain comes. I have been feeling very tired. I had a copd exacerbation. When i get these chest pain i also get a really bad headache.

## 2021-06-11 NOTE — Telephone Encounter (Signed)
Stress test has been ordered. Patient is aware.

## 2021-06-14 DIAGNOSIS — J449 Chronic obstructive pulmonary disease, unspecified: Secondary | ICD-10-CM | POA: Diagnosis not present

## 2021-06-15 ENCOUNTER — Telehealth (HOSPITAL_COMMUNITY): Payer: Self-pay | Admitting: *Deleted

## 2021-06-15 NOTE — Telephone Encounter (Signed)
Patient given detailed instructions per Myocardial Perfusion Study Information Sheet for the test on 06/19/21 at 10:45. Patient notified to arrive 15 minutes early and that it is imperative to arrive on time for appointment to keep from having the test rescheduled.  If you need to cancel or reschedule your appointment, please call the office within 24 hours of your appointment. . Patient verbalized understanding.[name]

## 2021-06-16 ENCOUNTER — Other Ambulatory Visit: Payer: Self-pay | Admitting: Internal Medicine

## 2021-06-16 DIAGNOSIS — I25119 Atherosclerotic heart disease of native coronary artery with unspecified angina pectoris: Secondary | ICD-10-CM

## 2021-06-16 NOTE — Telephone Encounter (Signed)
Please refill as per office routine med refill policy (all routine meds to be refilled for 3 mo or monthly (per pt preference) up to one year from last visit, then month to month grace period for 3 mo, then further med refills will have to be denied) ? ?

## 2021-06-19 ENCOUNTER — Telehealth: Payer: Self-pay

## 2021-06-19 ENCOUNTER — Other Ambulatory Visit: Payer: Self-pay

## 2021-06-19 ENCOUNTER — Ambulatory Visit (HOSPITAL_COMMUNITY): Payer: Medicare HMO | Attending: Internal Medicine

## 2021-06-19 DIAGNOSIS — R072 Precordial pain: Secondary | ICD-10-CM | POA: Diagnosis not present

## 2021-06-19 LAB — MYOCARDIAL PERFUSION IMAGING
Base ST Depression (mm): 0 mm
LV dias vol: 82 mL (ref 46–106)
LV sys vol: 30 mL
Nuc Stress EF: 63 %
Peak HR: 86 {beats}/min
Rest HR: 65 {beats}/min
Rest Nuclear Isotope Dose: 10.4 mCi
SDS: 0
SRS: 0
SSS: 0
ST Depression (mm): 0 mm
Stress Nuclear Isotope Dose: 31.1 mCi
TID: 1.13

## 2021-06-19 MED ORDER — TECHNETIUM TC 99M TETROFOSMIN IV KIT
10.4000 | PACK | Freq: Once | INTRAVENOUS | Status: AC | PRN
  Administered 2021-06-19: 10.4 via INTRAVENOUS
  Filled 2021-06-19: qty 11

## 2021-06-19 MED ORDER — TECHNETIUM TC 99M TETROFOSMIN IV KIT
30.0000 | PACK | Freq: Once | INTRAVENOUS | Status: DC | PRN
  Filled 2021-06-19: qty 30

## 2021-06-19 MED ORDER — REGADENOSON 0.4 MG/5ML IV SOLN: 0.4000 mg | Freq: Once | INTRAVENOUS | Status: DC

## 2021-06-19 NOTE — Telephone Encounter (Signed)
[name] with [facility] has called in reference to pt receiving a 100 day supply of medication for her Metformin '500mg'$  and Atorvastatin '40mg'$  as she qualifies for this change with her insurance.

## 2021-06-20 NOTE — Telephone Encounter (Signed)
Ok to let [name] know that despite her insurance generosity, this is not medically necessary and change in quantity of her rx's is not needed

## 2021-06-21 ENCOUNTER — Telehealth: Payer: Self-pay | Admitting: Adult Health

## 2021-06-21 MED ORDER — PREDNISONE 10 MG PO TABS: 20.0000 mg | ORAL_TABLET | Freq: Every day | ORAL | 0 refills | Status: AC

## 2021-06-21 NOTE — Telephone Encounter (Signed)
Cardiac stress test this week showed preserved EF and low risk. Patient may be having a COPD flare.  Can send prednisone 20 mg daily for 5 days.  If this is not improving or worsens she will need sooner follow-up or seek emergency room care or go to the urgent care  Please contact office for sooner follow up if symptoms do not improve or worsen or seek emergency care   Make sure she has a follow-up visit in the next few weeks to make sure she is back to baseline

## 2021-06-21 NOTE — Telephone Encounter (Signed)
Primary Pulmonologist: Dr. Elsworth Soho  Last office visit and with whom: 05/24/21 with Tammy Parrett What do we see them for (pulmonary problems): COPD, chronic respiratory failure  Last OV assessment/plan: see below  Was appointment offered to patient (explain)?    Plan  Patient Instructions  Continue on TRELEGY 1 puff daily  May use Duoneb as needed.  Continue on Oxygen 3l/m .  Activity as tolerated.  Work on not smoking .  Follow up with Dr. Elsworth Soho  In 3 months and As needed   Please contact office for sooner follow up if symptoms do not improve or worsen or seek emergency care      Reason for call: patient stated she started feeling more SOB yesterday and it has gotten worse. No coughing, wheezing or chest tightness. Had to sleep sitting up due to not being able to breathe while lying flat. Been using Trelegy, duonebs every 6 hours and oxygen at 3L. Checked O2 stats while on phone but would not register due to "nail polish" patient did sound SOB while trying to talk. Will route to Tammy as Dr.Alva is off.  Tammy, please advise. Thanks!  (examples of things to ask: : When did symptoms start? Fever? Cough? Productive? Color to sputum? More sputum than usual? Wheezing? Have you needed increased oxygen? Are you taking your respiratory medications? What over the counter measures have you tried?)  Allergies  Allergen Reactions   Augmentin [Amoxicillin-Pot Clavulanate] Nausea And Vomiting    Immunization History  Administered Date(s) Administered   Influenza Split 07/08/2013   Influenza,inj,Quad PF,6+ Mos 07/31/2015, 09/03/2017   PFIZER Comirnaty(Gray Top)Covid-19 Tri-Sucrose Vaccine 08/31/2020   PFIZER(Purple Top)SARS-COV-2 Vaccination 01/01/2020, 01/25/2020   Pneumococcal Polysaccharide-23 02/02/2014, 05/03/2019

## 2021-06-21 NOTE — Telephone Encounter (Signed)
Called and spoke with patient. She verbalized understanding. Prednisone has been sent to the pharmacy.   Nothing further needed at time of call.

## 2021-06-26 ENCOUNTER — Telehealth: Payer: Self-pay | Admitting: Pulmonary Disease

## 2021-06-26 NOTE — Telephone Encounter (Signed)
Patient called requesting refill on prednisone '5mg'$ . I have looked in the chart and I do not see any notes about patient being on this. Patient stated script ran out in July and just noticed. I see where patient has been on many pred tapers but told to stop. Will route to dr. Elsworth Soho for clarification.  Dr. Elsworth Soho, please advise on prednisone '5mg'$ . Thanks!

## 2021-06-27 NOTE — Telephone Encounter (Signed)
I called and spoke with patient regarding Dr. Elsworth Soho recs. Patient verbalized understanding, nothing further needed.

## 2021-07-09 ENCOUNTER — Telehealth: Payer: Self-pay | Admitting: Pulmonary Disease

## 2021-07-09 NOTE — Telephone Encounter (Signed)
Primary Pulmonologist: Dr. Elsworth Soho Last office visit and with whom: 05/24/21-Tammy, NP What do we see them for (pulmonary problems): COPD Last OV assessment/plan:  Instructions  Continue on TRELEGY 1 puff daily  May use Duoneb as needed.  Continue on Oxygen 3l/m .  Activity as tolerated.  Work on not smoking .  Follow up with Dr. Elsworth Soho  In 3 months and As needed   Please contact office for sooner follow up if symptoms do not improve or worsen or seek emergency care          Reason for call:  Called and spoke with Patient.  Patient stated she is having increased sob, feels like she can not always get a good breath in her lungs.  Patient denies cough, fever, or any other symptoms.  Patient stated she is using Trelegy daily and albuterol every 6 hours. Patient stated she gets like this whenever Prednisone is discontinued.  Patient is requesting Pred[facility]one [facility] [address]  Message routed to Dr. Elsworth Soho   Allergies  Allergen Reactions   Augmentin [Amoxicillin-Pot Clavulanate] Nausea And Vomiting    Immunization History  Administered Date(s) Administered   Influenza Split 07/08/2013   Influenza,inj,Quad PF,6+ Mos 07/31/2015, 09/03/2017   PFIZER Comirnaty(Gray Top)Covid-19 Tri-Sucrose Vaccine 08/31/2020   PFIZER(Purple Top)SARS-COV-2 Vaccination 01/01/2020, 01/25/2020   Pneumococcal Polysaccharide-23 02/02/2014, 05/03/2019

## 2021-07-11 ENCOUNTER — Ambulatory Visit (INDEPENDENT_AMBULATORY_CARE_PROVIDER_SITE_OTHER): Payer: Medicare HMO | Admitting: Pulmonary Disease

## 2021-07-11 ENCOUNTER — Other Ambulatory Visit: Payer: Self-pay

## 2021-07-11 ENCOUNTER — Encounter: Payer: Self-pay | Admitting: Pulmonary Disease

## 2021-07-11 VITALS — BP 120/50 | HR 75 | Temp 98.6°F | Ht 60.0 in | Wt 169.0 lb

## 2021-07-11 DIAGNOSIS — J441 Chronic obstructive pulmonary disease with (acute) exacerbation: Secondary | ICD-10-CM | POA: Diagnosis not present

## 2021-07-11 DIAGNOSIS — J9611 Chronic respiratory failure with hypoxia: Secondary | ICD-10-CM | POA: Diagnosis not present

## 2021-07-11 DIAGNOSIS — J449 Chronic obstructive pulmonary disease, unspecified: Secondary | ICD-10-CM

## 2021-07-11 DIAGNOSIS — Z720 Tobacco use: Secondary | ICD-10-CM | POA: Diagnosis not present

## 2021-07-11 MED ORDER — PREDNISONE 10 MG PO TABS: ORAL_TABLET | ORAL | 0 refills | Status: DC

## 2021-07-11 MED ORDER — METHYLPREDNISOLONE ACETATE 80 MG/ML IJ SUSP
80.0000 mg | Freq: Once | INTRAMUSCULAR | Status: AC
  Administered 2021-07-11: 80 mg via INTRAMUSCULAR

## 2021-07-11 MED ORDER — NICOTINE 10 MG IN INHA: 1.0000 | RESPIRATORY_TRACT | Status: DC | PRN

## 2021-07-11 MED ORDER — ALBUTEROL SULFATE HFA 108 (90 BASE) MCG/ACT IN AERS: INHALATION_SPRAY | RESPIRATORY_TRACT | 2 refills | Status: DC

## 2021-07-11 MED ORDER — METHYLPREDNISOLONE SODIUM SUCC 125 MG IJ SOLR: 80.0000 mg | Freq: Once | INTRAMUSCULAR | Status: DC

## 2021-07-11 NOTE — Progress Notes (Signed)
   Subjective:    Patient ID: [name], female    DOB: [birth year], 65 y.o.   MRN: [id number]  HPI  65 yo smoker for FU of COPD, quit 11/2017 and mild tracheomalacia with chronic hypoxic resp failure on o2 since 2018   Pred tapers rx on 8/15 & 9/1 -phone visits Patient stated she is having increased sob, feels like she can not always get a good breath in her lungs.  Patient denies cough, fever, or any other symptoms.  Patient stated she is using Trelegy daily and albuterol every 6 hours. Patient stated she gets like this whenever Prednisone is discontinued.   Office visit was scheduled today to discuss, she frankly admits that she started smoking again 3 to 4 cigarettes/day. She is living with her sister now.  Reports difficulty breathing, increased wheezing no cough or sputum production, no fevers or sick contacts   Significant tests/ events reviewed  CT angiogram chest 01/2020-emphysema, mild tracheomalacia   04/2019-pulmonary function test- FVC 1.05 (44% predicted), postbronchodilator ratio 47, postbronchodilator FEV1 0.47 (25% predicted), DLCO 54   Spirometry 02/2017 >> ratio of 59, FEV1 of 42% FVC of 56% ABG 01/2014 was 7.29/66/75/92% CT angiogram 11/2016 was negative for pulmonary embolism   08/2014 >> PSG neg for OSA , Desaturation as low as 85%  Review of Systems neg for any significant sore throat, dysphagia, itching, sneezing, nasal congestion or excess/ purulent secretions, fever, chills, sweats, unintended wt loss, pleuritic or exertional cp, hempoptysis, orthopnea pnd or change in chronic leg swelling. Also denies presyncope, palpitations, heartburn, abdominal pain, nausea, vomiting, diarrhea or change in bowel or urinary habits, dysuria,hematuria, rash, arthralgias, visual complaints, headache, numbness weakness or ataxia.     Objective:   Physical Exam  Gen. Pleasant, obese, in no distress ENT - no lesions, no post nasal drip Neck: No JVD, no thyromegaly, no carotid  bruits Lungs: no use of accessory muscles, no dullness to percussion, decreased without rales or rhonchi  Cardiovascular: Rhythm regular, heart sounds  normal, no murmurs or gallops, no peripheral edema Musculoskeletal: No deformities, no cyanosis or clubbing , no tremors       Assessment & Plan:

## 2021-07-11 NOTE — Patient Instructions (Addendum)
Refills on albuterol    Solumedrol 80 IM x 1 now Prednisone 10 mg tabs  Take 2 tabs daily with food x 7 ds, then 1 tab daily with food x 21ds then 1 tab every other day x 2 weeks then stop  # 45  Rx for nicotrol inhaler # 150

## 2021-07-11 NOTE — Assessment & Plan Note (Signed)
Smoking cessation again discussed is the most important intervention for her. Nicotine patches have not worked for her. We will prescribe her Nicotrol inhaler

## 2021-07-11 NOTE — Assessment & Plan Note (Signed)
She will continue on oxygen during exertion and sleep

## 2021-07-11 NOTE — Assessment & Plan Note (Signed)
We will treat her as COPD exacerbation, she is compliant with Trelegy. Duo nebs will be refilled. We will give her Solu-Medrol 80 mg IM today and follow this up with prednisone 20 mg for a week and then a slow taper by 10 mg maintained at this dose for 3 to 4 weeks before tapering to off within 1 to 2 weeks.  We will schedule a follow-up in 8 weeks to reassess

## 2021-07-12 ENCOUNTER — Other Ambulatory Visit: Payer: Self-pay | Admitting: Cardiology

## 2021-07-12 NOTE — Telephone Encounter (Signed)
Dr. Elsworth Soho, Patient you a message this morning.  Dr Elsworth Soho. My oxygen. Dropped to 88 last night. I got it back on by turning the the ma machine on 4 an tacking deep breathe. I started the predisone pill today. When should i get some reliel.

## 2021-07-15 DIAGNOSIS — J449 Chronic obstructive pulmonary disease, unspecified: Secondary | ICD-10-CM | POA: Diagnosis not present

## 2021-07-19 ENCOUNTER — Telehealth: Payer: Self-pay | Admitting: Pulmonary Disease

## 2021-07-19 NOTE — Telephone Encounter (Signed)
Vallarie Mare please advise if the CMN from [place].  Thanks

## 2021-07-19 NOTE — Telephone Encounter (Signed)
Yes I have it just got it this morning Elsworth Soho is not in till Monday I will have him sign it then

## 2021-07-19 NOTE — Telephone Encounter (Signed)
[name] from [employer] on the fax sent yesterday for portable oxygen concentrator. [name] phone number is [phone].

## 2021-07-19 NOTE — Telephone Encounter (Signed)
Called Patient.  Patient made aware fax from [place] was received and Dr. Elsworth Soho will sign once he returns to office 07/23/21. Understanding stated.

## 2021-07-23 ENCOUNTER — Telehealth: Payer: Self-pay | Admitting: Pulmonary Disease

## 2021-07-23 NOTE — Telephone Encounter (Signed)
Vallarie Mare please advise if the fax for Charisse Klinefelter has been faxed back today.  Pt is wanting to make sure this has been done.  Thanks

## 2021-07-23 NOTE — Telephone Encounter (Signed)
Joanna Hews it earlier today im in [place]

## 2021-07-31 ENCOUNTER — Telehealth: Payer: Self-pay | Admitting: Pulmonary Disease

## 2021-07-31 MED ORDER — PREDNISONE 20 MG PO TABS: 40.0000 mg | ORAL_TABLET | Freq: Every day | ORAL | 0 refills | Status: AC

## 2021-07-31 MED ORDER — CEFDINIR 300 MG PO CAPS: 300.0000 mg | ORAL_CAPSULE | Freq: Two times a day (BID) | ORAL | 0 refills | Status: DC

## 2021-07-31 NOTE — Telephone Encounter (Signed)
Pt called back and a new telephone encounter was made so will close this encounter.

## 2021-07-31 NOTE — Telephone Encounter (Signed)
Called and spoke with patient. She was seen by RA last month on 07/11/21 and was given a depo injection as well as a prednisone taper. She felt well for a few days but her symptoms returned. She has been more SOB and having a productive cough. She has been coughing up greenish,brownish phlegm. She also has a runny nose but has not seen any color to the discharge.   She broke out in several sweats yesterday but never developed a fever. Denies being around anyone who has been sick recently.   She is still using her oxygen at 3L. She has also been using her Trelegy inhaler and nebulizer solution.   [facility] [address]   RA, can you please advise? Thanks!

## 2021-07-31 NOTE — Telephone Encounter (Signed)
Called and spoke with patient. She is aware of RA's recommendations. She will do an at home covid test and call us back with the results.   Will go ahead and send in the abx and prednisone.   Will keep this encounter open for her to call back.

## 2021-07-31 NOTE — Telephone Encounter (Signed)
Called pt to get info about symptoms, had to [facility].Also emailed her letting her know she needs to call the office.

## 2021-07-31 NOTE — Telephone Encounter (Signed)
Pt returning call from [place] in regards to 9/18 MyChart encounter (I wasnt able to document there). "Good morning. Been up throughout the night. Afraid to fall asleep. Because my breathing was week. When i coughed up phelm yesterday it was greenish. [place]. Lors of sweating but no feverj. Please advise."

## 2021-08-09 NOTE — Telephone Encounter (Signed)
RA - please advise. Thanks! 

## 2021-08-13 ENCOUNTER — Ambulatory Visit (INDEPENDENT_AMBULATORY_CARE_PROVIDER_SITE_OTHER): Payer: Medicare HMO

## 2021-08-13 ENCOUNTER — Ambulatory Visit (INDEPENDENT_AMBULATORY_CARE_PROVIDER_SITE_OTHER): Payer: Medicare HMO | Admitting: Primary Care

## 2021-08-13 ENCOUNTER — Other Ambulatory Visit: Payer: Self-pay

## 2021-08-13 ENCOUNTER — Encounter: Payer: Self-pay | Admitting: Primary Care

## 2021-08-13 VITALS — BP 120/50 | HR 94 | Temp 98.6°F | Ht 61.0 in | Wt 175.0 lb

## 2021-08-13 DIAGNOSIS — J449 Chronic obstructive pulmonary disease, unspecified: Secondary | ICD-10-CM

## 2021-08-13 DIAGNOSIS — I700 Atherosclerosis of aorta: Secondary | ICD-10-CM | POA: Diagnosis not present

## 2021-08-13 DIAGNOSIS — J441 Chronic obstructive pulmonary disease with (acute) exacerbation: Secondary | ICD-10-CM

## 2021-08-13 DIAGNOSIS — J4489 Other specified chronic obstructive pulmonary disease: Secondary | ICD-10-CM

## 2021-08-13 DIAGNOSIS — Z951 Presence of aortocoronary bypass graft: Secondary | ICD-10-CM | POA: Diagnosis not present

## 2021-08-13 MED ORDER — PREDNISONE 10 MG PO TABS: ORAL_TABLET | ORAL | 0 refills | Status: DC

## 2021-08-13 NOTE — Assessment & Plan Note (Addendum)
-   Patient was treated for flare up in September with Omnicef and pred taper. Symptoms initially improved but returned shortly after completing.  - Recommend checking CXR and respiratory sputum sample - Sending in prednisone taper (40mg  x 3 days; 30mg  x 3 days; 20mg  x 3 days) then advised her to stay on 10mg  daily until follow-up  - Advised patient take mucinex 600mg  twice daily and use flutter valve/IS

## 2021-08-13 NOTE — Progress Notes (Signed)
Please let patient know CXR showed no evidence of acute cardiopulmonary process such as pneumonia. Chronic bronchitis. Treat with Mucinex, flutter valve and bronchodilators (Trelegy and Albuterol). No abx needed, we will wait for sputum sample

## 2021-08-13 NOTE — Progress Notes (Signed)
@Patient  ID: [name], female    DOB: 1956/03/18, 65 y.o.   MRN: [id number]  No chief complaint on file.   Referring provider: Biagio Borg, MD  HPI: 65 yo smoker for FU of COPD, quit 11/2017. PMH significant for mild tracheomalacia with chronic hypoxic resp failure on o2 since 2018. Patient of Dr. Elsworth Soho, last seen on 07/11/21.   Previous [facility] pulmonary encounter:  07/11/21- Dr. Elsworth Soho  Pred tapers rx on 8/15 & 9/1 -phone visits Patient stated she is having increased sob, feels like she can not always get a good breath in her lungs.  Patient denies cough, fever, or any other symptoms.  Patient stated she is using Trelegy daily and albuterol every 6 hours. Patient stated she gets like this whenever Prednisone is discontinued.   Office visit was scheduled today to discuss, she frankly admits that she started smoking again 3 to 4 cigarettes/day. She is living with her sister now.  Reports difficulty breathing, increased wheezing no cough or sputum production, no fevers or sick contacts   08/13/2021- Interim hx  Patient presents today for acute OV. Patient contacted our office on 08/07/21 with reports of not feeling well. She is coughing up some phlegm. Associated headache and backache. She completed course of omnicef and prednisone but symptoms returned shortly after finishing. She is compliant with Trelegy 27mcg 1 puff daily in the morning. She uses albuterol rescue inhaler twice a day with some improvement. She is talking Mucinex once daily. No currently using flutter valve or Incentive spirometer. She is on portable oxygen concentrator.     Significant tests/ events reviewed  CT angiogram chest 01/2020-emphysema, mild tracheomalacia   04/2019-pulmonary function test- FVC 1.05 (44% predicted), postbronchodilator ratio 47, postbronchodilator FEV1 0.47 (25% predicted), DLCO 54   Spirometry 02/2017 >> ratio of 59, FEV1 of 42% FVC of 56% ABG 01/2014 was 7.29/66/75/92% CT angiogram 11/2016  was negative for pulmonary embolism   08/2014 >> PSG neg for OSA , Desaturation as low as 85%   Allergies  Allergen Reactions   Augmentin [Amoxicillin-Pot Clavulanate] Nausea And Vomiting    Immunization History  Administered Date(s) Administered   Influenza Split 07/08/2013   Influenza,inj,Quad PF,6+ Mos 07/31/2015, 09/03/2017   PFIZER Comirnaty(Gray Top)Covid-19 Tri-Sucrose Vaccine 08/31/2020   PFIZER(Purple Top)SARS-COV-2 Vaccination 01/01/2020, 01/25/2020   Pneumococcal Polysaccharide-23 02/02/2014, 05/03/2019    Past Medical History:  Diagnosis Date   Bronchitis    CAD (coronary artery disease)    a. 02/2002: CABG x3 with LIMA to LAD, RIMA to RCA, and SVG to OM   CHF (congestive heart failure) (HCC)    COPD (chronic obstructive pulmonary disease) ([place])    Diabetes ([place])    Headache    Heart disease    Hyperlipidemia    Hypertension    Impaired glucose tolerance 08/20/2014   Sinusitis     Tobacco History: Social History   Tobacco Use  Smoking Status Some Days   Packs/day: 0.50   Years: 47.00   Pack years: 23.50   Types: Cigarettes   Start date: 12/29/1971   Last attempt to quit: 05/22/2019   Years since quitting: 2.2  Smokeless Tobacco Never  Tobacco Comments   Once she starts feeling better she begins the cycle of smoking again, until she feels bad again       2-3 per day 08/13/2021   Ready to quit: Not Answered Counseling given: Not Answered Tobacco comments: Once she starts feeling better she begins the cycle of smoking again,  until she feels bad again   2-3 per day 08/13/2021   Outpatient Medications Prior to Visit  Medication Sig Dispense Refill   albuterol (VENTOLIN HFA) 108 (90 Base) MCG/ACT inhaler INHALE 1-2 PUFFS BY MOUTH EVERY 6 HOURS AS NEEDED FOR WHEEZE OR SHORTNESS OF BREATH 8.5 each 2   amLODipine (NORVASC) 5 MG tablet Take 1 tablet (5 mg total) by mouth daily. 90 tablet 2   atorvastatin (LIPITOR) 40 MG tablet TAKE 1 TABLET BY MOUTH EVERY  DAY 90 tablet 3   carvedilol (COREG) 12.5 MG tablet TAKE 1 TABLET BY MOUTH TWICE A DAY 180 tablet 3   cefdinir (OMNICEF) 300 MG capsule Take 1 capsule (300 mg total) by mouth 2 (two) times daily. 14 capsule 0   cholecalciferol (VITAMIN D) 1000 units tablet Take 1,000 Units by mouth daily.     Dulaglutide (TRULICITY) 5.09 TO/6.7TI SOPN Inject 0.75 mg into the skin once a week. 6 mL 3   Fluticasone-Umeclidin-Vilant (TRELEGY ELLIPTA) 200-62.5-25 MCG/INH AEPB Inhale 1 puff into the lungs daily. 1 each 11   furosemide (LASIX) 40 MG tablet TAKE 1 TABLET BY MOUTH TWICE A DAY 180 tablet 0   ipratropium-albuterol (DUONEB) 0.5-2.5 (3) MG/3ML SOLN TAKE 3 MLS BY NEBULIZATION EVERY 6 (SIX) HOURS AS NEEDED. 360 mL 5   isosorbide mononitrate (IMDUR) 60 MG 24 hr tablet TAKE 1.5 TABLETS BY MOUTH DAILY. PLEASE MAKE A YEARLY APPT WITH DR. Marlou Porch FOR APRIL 2022 135 tablet 2   KLOR-CON M10 10 MEQ tablet TAKE 1 TABLET BY MOUTH EVERY DAY 90 tablet 0   Melatonin 10 MG SUBL Place 1 each under the tongue as needed.     metFORMIN (GLUCOPHAGE-XR) 500 MG 24 hr tablet TAKE 1 TABLET BY MOUTH EVERY DAY WITH BREAKFAST 90 tablet 3   Multiple Vitamin (MULTIVITAMIN WITH MINERALS) TABS tablet Take 1 tablet by mouth daily.     vitamin B-12 (CYANOCOBALAMIN) 1000 MCG tablet Take 1,000 mcg by mouth daily.     vitamin C (ASCORBIC ACID) 500 MG tablet Take 500 mg by mouth daily.      predniSONE (DELTASONE) 10 MG tablet Take 2 tablets (20 mg total) by mouth daily with breakfast for 7 days, THEN 1 tablet (10 mg total) daily with breakfast for 21 days, THEN 1 tablet (10 mg total) every other day for 14 days. 42 tablet 0   Facility-Administered Medications Prior to Visit  Medication Dose Route Frequency Provider Last Rate Last Admin   nicotine (NICOTROL) 10 MG inhaler 1 continuous puffing  1 continuous puffing Inhalation PRN Rigoberto Noel, MD       regadenoson (LEXISCAN) injection SOLN 0.4 mg  0.4 mg Intravenous Once Fay Records, MD        technetium tetrofosmin (TC-MYOVIEW) injection 30 millicurie  30 millicurie Intravenous Once PRN Fay Records, MD        Review of Systems  Review of Systems  Constitutional: Negative.   HENT: Negative.    Respiratory:  Positive for cough, chest tightness and shortness of breath.   Cardiovascular: Negative.     Physical Exam  BP (!) 120/50 (BP Location: Left Arm, Patient Position: Sitting, Cuff Size: Normal)   Pulse 94   Temp 98.6 F (37 C) (Oral)   Ht 5\' 1"  (1.549 m)   Wt 175 lb (79.4 kg)   SpO2 90%   BMI 33.07 kg/m  Physical Exam Constitutional:      Appearance: Normal appearance.  HENT:     Head: Normocephalic  and atraumatic.  Cardiovascular:     Rate and Rhythm: Normal rate and regular rhythm.  Pulmonary:     Effort: Pulmonary effort is normal.     Breath sounds: Wheezing present. No rhonchi or rales.  Neurological:     General: No focal deficit present.     Mental Status: She is alert and oriented to person, place, and time. Mental status is at baseline.  Psychiatric:        Mood and Affect: Mood normal.        Behavior: Behavior normal.        Thought Content: Thought content normal.        Judgment: Judgment normal.     Lab Results:  CBC    Component Value Date/Time   WBC 7.8 04/13/2021 1216   RBC 4.88 04/13/2021 1216   HGB 13.9 04/13/2021 1216   HGB 15.7 06/12/2014 1543   HCT 41.9 04/13/2021 1216   HCT 48.3 (H) 06/12/2014 1543   PLT 161.0 04/13/2021 1216   PLT 178 06/12/2014 1543   MCV 86.0 04/13/2021 1216   MCV 91 06/12/2014 1543   MCH 29.0 01/28/2020 0205   MCHC 33.1 04/13/2021 1216   RDW 13.5 04/13/2021 1216   RDW 14.8 (H) 06/12/2014 1543   LYMPHSABS 2.6 04/13/2021 1216   MONOABS 0.6 04/13/2021 1216   EOSABS 0.1 04/13/2021 1216   BASOSABS 0.1 04/13/2021 1216    BMET    Component Value Date/Time   NA 142 04/13/2021 1216   NA 142 06/12/2014 1543   K 3.6 04/13/2021 1216   K 3.6 06/12/2014 1543   CL 95 (L) 04/13/2021 1216   CL 105  06/12/2014 1543   CO2 40 (H) 04/13/2021 1216   CO2 29 06/12/2014 1543   GLUCOSE 90 04/13/2021 1216   GLUCOSE 113 (H) 06/12/2014 1543   BUN 11 04/13/2021 1216   BUN 12 06/12/2014 1543   CREATININE 0.42 04/13/2021 1216   CREATININE 0.62 06/12/2014 1543   CALCIUM 9.5 04/13/2021 1216   CALCIUM 8.9 06/12/2014 1543   GFRNONAA NOT CALCULATED 01/28/2020 0205   GFRNONAA >60 06/12/2014 1543   GFRAA NOT CALCULATED 01/28/2020 0205   GFRAA >60 06/12/2014 1543    BNP    Component Value Date/Time   BNP 61.4 01/28/2020 0826    ProBNP    Component Value Date/Time   PROBNP 48.0 03/21/2021 1253    Imaging: No results found.   Assessment & Plan:   COPD exacerbation ([place]) - Patient was treated for flare up in September with Omnicef and pred taper. Symptoms initially improved but returned shortly after completing.  - Recommend checking CXR and respiratory sputum sample - Sending in prednisone taper (40mg  x 3 days; 30mg  x 3 days; 20mg  x 3 days) then advised her to stay on 10mg  daily until follow-up  - Advised patient take mucinex 600mg  twice daily and use flutter valve/IS   COPD (chronic obstructive pulmonary disease) with chronic bronchitis (HCC) - Recurrent exacerbations. Continue Trelegy 216mcg daily. Recommend patient see Dr. Elsworth Soho in 4 weeks to discuss management. Consider adding daliresp vs daily azithromycin.    Martyn Ehrich, NP 08/13/2021

## 2021-08-13 NOTE — Addendum Note (Signed)
Addended by: Martyn Ehrich on: 08/13/2021 02:02 PM   Modules accepted: Orders

## 2021-08-13 NOTE — Patient Instructions (Addendum)
Recommendations: - Continue Trelegy 250mcg 1 puff daily daily in the morning (rinse mouth after use)  - Take mucinex twice a day - Use flutter three times a day and Incentive spirometer 5-10 deep breaths every hour while awake  - Take prednisone taper as directed and then stay on 10mg  daily until follow-up - Other options may be adding daliresp or daily antibiotic to prevent flare ups, I want you to discuss this at follow-up with Dr. Elsworth Soho   Orders: Respiratory sputum sample CXR today re: COPD exacerbation   Follow-up: - 4 weeks with Dr. Elsworth Soho only

## 2021-08-13 NOTE — Assessment & Plan Note (Signed)
-   Recurrent exacerbations. Continue Trelegy 232mcg daily. Recommend patient see Dr. Elsworth Soho in 4 weeks to discuss management. Consider adding daliresp vs daily azithromycin.

## 2021-08-14 ENCOUNTER — Telehealth: Payer: Self-pay | Admitting: Internal Medicine

## 2021-08-14 DIAGNOSIS — J449 Chronic obstructive pulmonary disease, unspecified: Secondary | ICD-10-CM | POA: Diagnosis not present

## 2021-08-14 MED ORDER — TRULICITY 0.75 MG/0.5ML ~~LOC~~ SOAJ: 0.7500 mg | SUBCUTANEOUS | 3 refills | Status: DC

## 2021-08-14 NOTE — Telephone Encounter (Signed)
Patient calling to request a new rx for Dulaglutide (TRULICITY) 6.18 MQ/5.9CN SOPN   Patient was last seen on 05-07-2021  Patient requesting new rx faxed to [facility]  [phone]  Phone [phone]

## 2021-08-14 NOTE — Telephone Encounter (Signed)
Prescription faxed to pharmacy and patient notified.

## 2021-08-16 ENCOUNTER — Telehealth: Payer: Self-pay | Admitting: Pulmonary Disease

## 2021-08-16 MED ORDER — IPRATROPIUM-ALBUTEROL 0.5-2.5 (3) MG/3ML IN SOLN
3.0000 mL | Freq: Four times a day (QID) | RESPIRATORY_TRACT | 11 refills | Status: DC | PRN
Start: 2021-08-16 — End: 2022-01-08

## 2021-08-16 NOTE — Telephone Encounter (Signed)
Rx has been sent to preferred pharmacy for pt. Called and spoke with pt letting her know this had been done and she verbalized understanding. Nothing further needed. °

## 2021-08-21 ENCOUNTER — Telehealth: Payer: Self-pay | Admitting: Internal Medicine

## 2021-08-21 NOTE — Telephone Encounter (Signed)
LVM for pt to rtn my call to schedule AWV with [facility].  

## 2021-08-31 ENCOUNTER — Other Ambulatory Visit: Payer: Self-pay | Admitting: Primary Care

## 2021-09-05 ENCOUNTER — Telehealth: Payer: Self-pay | Admitting: Pulmonary Disease

## 2021-09-05 MED ORDER — PREDNISONE 10 MG PO TABS: 10.0000 mg | ORAL_TABLET | Freq: Every day | ORAL | 0 refills | Status: DC

## 2021-09-05 NOTE — Telephone Encounter (Signed)
Rx for prednisone 10mg  has been sent to preferred pharmacy.  Patient is aware and voiced her understanding.  Nothing further needed at this time.

## 2021-09-14 DIAGNOSIS — J449 Chronic obstructive pulmonary disease, unspecified: Secondary | ICD-10-CM | POA: Diagnosis not present

## 2021-09-18 ENCOUNTER — Telehealth: Payer: Self-pay | Admitting: Pulmonary Disease

## 2021-09-18 NOTE — Telephone Encounter (Signed)
Called patient but she did not answer. Left message for patient to call back.  

## 2021-09-20 MED ORDER — PREDNISONE 10 MG PO TABS: 10.0000 mg | ORAL_TABLET | Freq: Every day | ORAL | 0 refills | Status: DC

## 2021-09-20 NOTE — Telephone Encounter (Signed)
I called and spoke with the pt  She states she is needing a refill on her pred 10 mg  She takes this as maintenance Ran out sooner than expected bc had a hard time breathing last wk and took extra tablet daily  This helped and she says today her SOB is back to baseline Rx refilled  OV with RA set up for 10/04/21

## 2021-10-04 ENCOUNTER — Ambulatory Visit (INDEPENDENT_AMBULATORY_CARE_PROVIDER_SITE_OTHER): Payer: Medicare HMO | Admitting: Pulmonary Disease

## 2021-10-04 ENCOUNTER — Encounter: Payer: Self-pay | Admitting: Pulmonary Disease

## 2021-10-04 ENCOUNTER — Other Ambulatory Visit: Payer: Self-pay

## 2021-10-04 DIAGNOSIS — J449 Chronic obstructive pulmonary disease, unspecified: Secondary | ICD-10-CM

## 2021-10-04 DIAGNOSIS — J9611 Chronic respiratory failure with hypoxia: Secondary | ICD-10-CM | POA: Diagnosis not present

## 2021-10-04 DIAGNOSIS — Z23 Encounter for immunization: Secondary | ICD-10-CM | POA: Diagnosis not present

## 2021-10-04 MED ORDER — PREDNISONE 5 MG PO TABS: ORAL_TABLET | ORAL | 1 refills | Status: DC

## 2021-10-04 NOTE — Assessment & Plan Note (Signed)
She has done a lot better with maintenance oral steroids, however this has come at a price, off weight gain. She has not had any flareups in the past 2 to 3 months and this has been the first time this year that she has been symptomatically improved. We will attempt to taper prednisone slowly, I will have her alternate 10/5 mg of prednisone for the next month and then maintain her on 5 mg of prednisone until she sees [place] again in 3 months.  If she has no flareups we will attempt to taper again hopefully down to off.  I offered her pulmonary rehab but she cannot get to the center and prefers a home-based program She will continue on Trelegy and albuterol refills will be provided. Flu shot will be administered today

## 2021-10-04 NOTE — Addendum Note (Signed)
Addended by: Retia Passe on: 10/04/2021 04:31 PM   Modules accepted: Orders

## 2021-10-04 NOTE — Assessment & Plan Note (Signed)
She continues on oxygen 2 to 3 L pulse 24/7

## 2021-10-04 NOTE — Progress Notes (Signed)
° °  Subjective:    Patient ID: [name], female    DOB: 1956-08-13, 65 y.o.   MRN: [id number]  HPI 65 yo smoker for FU of COPD, quit 11/2017 and mild tracheomalacia with chronic hypoxic resp failure on o2 since 2018    Chief Complaint  Patient presents with   Follow-up    Follow up from September. Pt states she still gets SOB at times, does state that her inhalers do help. She is on 3L of portable O2       Pred tapers rx on 8/15 & 9/1 -phone visits  Last OV 07/11/21  Solu-Medrol 80 mg IM and prednisone maintenance starting at 20 mg. She cut down to 10 mg and has been maintained on this dose for the past month.  Breathing is okay. She has gained weight from 169 to her current weight of 177 pounds  She continues to have minimal sputum production, mild pedal edema for which she takes Lasix twice daily. Continues on Trelegy. She prefers Ventolin MDI  Significant tests/ events reviewed  CT angiogram chest 01/2020-emphysema, mild tracheomalacia   04/2019-pulmonary function test- FVC 1.05 (44% predicted), postbronchodilator ratio 47, postbronchodilator FEV1 0.47 (25% predicted), DLCO 54   Spirometry 02/2017 >> ratio of 59, FEV1 of 42% FVC of 56% ABG 01/2014 was 7.29/66/75/92% CT angiogram 11/2016 was negative for pulmonary embolism   08/2014 >> PSG neg for OSA , Desaturation as low as 85%    Past Medical History:  Diagnosis Date   Bronchitis    CAD (coronary artery disease)    a. 02/2002: CABG x3 with LIMA to LAD, RIMA to RCA, and SVG to OM   CHF (congestive heart failure) ([place])    COPD (chronic obstructive pulmonary disease) ([place])    Diabetes ([place])    Headache    Heart disease    Hyperlipidemia    Hypertension    Impaired glucose tolerance 08/20/2014   Sinusitis     Review of Systems neg for any significant sore throat, dysphagia, itching, sneezing, nasal congestion or excess/ purulent secretions, fever, chills, sweats, unintended wt loss, pleuritic or exertional cp,  hempoptysis, orthopnea pnd or change in chronic leg swelling. Also denies presyncope, palpitations, heartburn, abdominal pain, nausea, vomiting, diarrhea or change in bowel or urinary habits, dysuria,hematuria, rash, arthralgias, visual complaints, headache, numbness weakness or ataxia.     Objective:   Physical Exam  Gen. Pleasant, obese, in no distress ENT - no lesions, no post nasal drip, steroid facies Neck: No JVD, no thyromegaly, no carotid bruits Lungs: no use of accessory muscles, no dullness to percussion, decreased without rales or rhonchi  Cardiovascular: Rhythm regular, heart sounds  normal, no murmurs or gallops, no peripheral edema Musculoskeletal: No deformities, no cyanosis or clubbing , no tremors       Assessment & Plan:   Adverse effects of medication -plan a long discussion with her about side effects of long-term steroids and risk-benefit

## 2021-10-04 NOTE — Patient Instructions (Signed)
X  Rx for 5 mg prednisone x 60 tabs x 1 refill - take this on m/W/F , take 10 mg on other days until you runout  Continue on trelegy , use albuterol as needed

## 2021-10-05 NOTE — Telephone Encounter (Signed)
RA pt did confirm that she would like to be signed up for rehab.  Please advise. Thanks

## 2021-10-06 ENCOUNTER — Other Ambulatory Visit: Payer: Self-pay | Admitting: Cardiology

## 2021-10-06 ENCOUNTER — Other Ambulatory Visit: Payer: Self-pay | Admitting: Pulmonary Disease

## 2021-10-06 ENCOUNTER — Other Ambulatory Visit: Payer: Self-pay | Admitting: Internal Medicine

## 2021-10-06 NOTE — Telephone Encounter (Signed)
Please refill as per office routine med refill policy (all routine meds to be refilled for 3 mo or monthly (per pt preference) up to one year from last visit, then month to month grace period for 3 mo, then further med refills will have to be denied) ? ?

## 2021-10-08 ENCOUNTER — Other Ambulatory Visit: Payer: Self-pay | Admitting: Primary Care

## 2021-10-08 ENCOUNTER — Other Ambulatory Visit: Payer: Self-pay | Admitting: Internal Medicine

## 2021-10-08 DIAGNOSIS — I25119 Atherosclerotic heart disease of native coronary artery with unspecified angina pectoris: Secondary | ICD-10-CM

## 2021-10-09 ENCOUNTER — Telehealth: Payer: Self-pay | Admitting: Pulmonary Disease

## 2021-10-09 NOTE — Telephone Encounter (Signed)
Spoke with pt who states increased productive cough (greenish/ brown thick mucus) and SOB x 2 days. Pt denies fever/chills/GI upset. Pt states O2 sats are currently 95% on 3L of cont O2. Pt states taking Prednisone 10 mg daily with an additional 5 mg on MWF and Trelegy daily. Pt says she has not used rescue albuterol inhaler as of yet but does plan to. Dr. Elsworth Soho will you please advise?

## 2021-10-09 NOTE — Telephone Encounter (Signed)
How many milligrams of the doxycycline did you want to give pt?

## 2021-10-09 NOTE — Telephone Encounter (Signed)
ATC LVMTCB x 1  

## 2021-10-10 MED ORDER — DOXYCYCLINE HYCLATE 100 MG PO TABS: 100.0000 mg | ORAL_TABLET | Freq: Two times a day (BID) | ORAL | 0 refills | Status: DC

## 2021-10-10 NOTE — Telephone Encounter (Signed)
Rigoberto Noel, MD to Dierdre Highman, RN      5:37 PM  100 mg bid   Pt notified of response per RA. She verbalized understanding. Rx has been sent to pharm.

## 2021-10-14 DIAGNOSIS — J449 Chronic obstructive pulmonary disease, unspecified: Secondary | ICD-10-CM | POA: Diagnosis not present

## 2021-10-19 ENCOUNTER — Ambulatory Visit
Admission: EM | Admit: 2021-10-19 | Discharge: 2021-10-19 | Disposition: A | Discharge status: Home / Self Care | Payer: Medicare HMO | Attending: Student | Admitting: Student

## 2021-10-19 ENCOUNTER — Other Ambulatory Visit: Payer: Self-pay

## 2021-10-19 DIAGNOSIS — J441 Chronic obstructive pulmonary disease with (acute) exacerbation: Secondary | ICD-10-CM | POA: Diagnosis not present

## 2021-10-19 MED ORDER — PREDNISONE 10 MG (21) PO TBPK: ORAL_TABLET | Freq: Every day | ORAL | 0 refills | Status: DC

## 2021-10-19 NOTE — Discharge Instructions (Addendum)
-  Prednisone taper for cough/bronchitis. I recommend taking this in the morning as it could give you energy.  Avoid NSAIDs like ibuprofen and alleve while taking this medication as they can increase your risk of stomach upset and even GI bleeding when in combination with a steroid. You can continue tylenol (acetaminophen) up to 1000mg  3x daily. -You don't have a bacterial sinus, ear, or lung infection today.  -If symptoms get worse instead of better - shortness of breath, chest pain, dizziness, new fevers/chills, new facial pain - seek additional medical attention.  -Continue the home oxygen.  -Continue inhaler and nebulizer.

## 2021-10-19 NOTE — ED Triage Notes (Signed)
Pt c/o sore throat, earache, headache, fatigue and malaise for last 3 days

## 2021-10-19 NOTE — ED Provider Notes (Signed)
[name]    CSN: [id number] Arrival date & time: 10/19/21  1034      History   Chief Complaint Chief Complaint  Patient presents with   Otalgia    HPI [name] is a 65 y.o. female presenting with viral syndrome x3 days.  Saw pulm 1 week ago, doxycycline was sent for "lungs", finished 2 days ago without improvement. She was not diagnosed with pneumonia or other bacterial infection. Cough x3 days productive of yellow sputum. SOB worse than normal particularly with exertion.  Using albuterol inhaler and nebulizer twice daily x3 days with some improvement. 3L O2 at baseline, no increase in this.  Bilateral throbbing ear pain without hearing changes, dizziness, tinnitus. No sick contacts Negative covid test 10/10/21, symptoms started after this.  HPI  Past Medical History:  Diagnosis Date   Bronchitis    CAD (coronary artery disease)    a. 02/2002: CABG x3 with LIMA to LAD, RIMA to RCA, and SVG to OM   CHF (congestive heart failure) ([place])    COPD (chronic obstructive pulmonary disease) ([place])    Diabetes ([place])    Headache    Heart disease    Hyperlipidemia    Hypertension    Impaired glucose tolerance 08/20/2014   Sinusitis     Patient Active Problem List   Diagnosis Date Noted   Tobacco abuse 07/11/2021   Abnormal mammogram 04/13/2021   Trichomonal vulvovaginitis 04/13/2021   Vaginal discharge 04/13/2021   Aortic atherosclerosis ([place]) 04/13/2021   Tracheomalacia 11/23/2020   Vitamin D deficiency 01/03/2020   HLD (hyperlipidemia) 01/03/2020   Acute pancreatitis 09/09/2019   Epigastric pain 09/06/2019   Back pain 09/06/2019   Eustachian tube dysfunction 05/18/2019   Insomnia 05/18/2019   Chronic respiratory failure with hypoxia ([place]) 12/14/2018   COPD exacerbation ([place]) 09/03/2017   Chronic obstructive lung disease ([place]) 03/18/2017   Hypertensive disorder 03/18/2017   Former smoker 03/07/2017   Sinusitis, acute 04/08/2016   Left lumbar  radiculopathy 10/19/2015   Cough 08/29/2015   Pedal edema 02/22/2015   Diabetes ([place]) 08/20/2014   Pruritus 07/08/2014   Nocturnal hypoxemia 07/05/2014   Esophagus, foreign body 06/12/2014   Polycythemia, secondary 02/16/2014   5 mm Lung nodule, solitary 02/02/2014   GERD (gastroesophageal reflux disease) 02/01/2014   Hypersomnolence 02/01/2014   Allergic rhinitis 09/03/2013   CAD (coronary artery disease) 09/03/2013   Encounter for well adult exam with abnormal findings 09/03/2013   Essential hypertension 07/07/2013   COPD (chronic obstructive pulmonary disease) with chronic bronchitis ([place]) 07/07/2013   Status post coronary artery bypass grafting 07/07/2013   Headache 07/01/2013   Hypertensive emergency 07/01/2013   Polycythemia 07/01/2013    Past Surgical History:  Procedure Laterality Date   CORONARY ARTERY BYPASS GRAFT  2003   Triple bypass   ESOPHAGOGASTRODUODENOSCOPY Left 06/13/2014   Procedure: ESOPHAGOGASTRODUODENOSCOPY (EGD);  Surgeon: Juanita Craver, MD;  [facility] ENDOSCOPY;  Service: Endoscopy;  Laterality: Left;   RIGID ESOPHAGOSCOPY N/A 06/12/2014   Procedure: RIGID ESOPHAGOSCOPY WITH FOREIGN BODY REMOVAL;  Surgeon: Ascencion Dike, MD;  Location: [place] [facility];  Service: ENT;  Laterality: N/A;    OB History   No obstetric history on file.      Home Medications    Prior to Admission medications   Medication Sig Start Date End Date Taking? Authorizing Provider  predniSONE (STERAPRED UNI-PAK 21 TAB) 10 MG (21) TBPK tablet Take by mouth daily. Take 6 tabs by mouth daily  for 2 days, then  5 tabs for 2 days, then 4 tabs for 2 days, then 3 tabs for 2 days, 2 tabs for 2 days, then 1 tab by mouth daily for 2 days 10/19/21  Yes Hazel Sams, PA-C  albuterol (VENTOLIN HFA) 108 (90 Base) MCG/ACT inhaler INHALE 1-2 PUFFS BY MOUTH EVERY 6 HOURS AS NEEDED FOR WHEEZE OR SHORTNESS OF BREATH 10/08/21   Rigoberto Noel, MD  amLODipine (NORVASC) 5 MG tablet Take 1 tablet (5 mg total) by  mouth daily. 07/12/21   Jerline Pain, MD  atorvastatin (LIPITOR) 40 MG tablet TAKE 1 TABLET BY MOUTH EVERY DAY 10/09/21   Biagio Borg, MD  carvedilol (COREG) 12.5 MG tablet TAKE 1 TABLET BY MOUTH TWICE A DAY 01/01/21   Jerline Pain, MD  cholecalciferol (VITAMIN D) 1000 units tablet Take 1,000 Units by mouth daily.    [provider]  doxycycline (VIBRA-TABS) 100 MG tablet Take 1 tablet (100 mg total) by mouth 2 (two) times daily. 10/10/21   Rigoberto Noel, MD  Dulaglutide (TRULICITY) 1.60 VP/7.1GG SOPN Inject 0.75 mg into the skin once a week. 08/14/21   Biagio Borg, MD  Fluticasone-Umeclidin-Vilant (TRELEGY ELLIPTA) 200-62.5-25 MCG/INH AEPB Inhale 1 puff into the lungs daily. 04/05/21   Parrett, Fonnie Mu, NP  furosemide (LASIX) 40 MG tablet TAKE 1 TABLET BY MOUTH TWICE A DAY 10/08/21   Biagio Borg, MD  ipratropium-albuterol (DUONEB) 0.5-2.5 (3) MG/3ML SOLN Take 3 mLs by nebulization every 6 (six) hours as needed. 08/16/21   Rigoberto Noel, MD  isosorbide mononitrate (IMDUR) 60 MG 24 hr tablet Take 1.5 tablets (90 mg total) by mouth daily. 10/08/21   Jerline Pain, MD  Melatonin 10 MG SUBL Place 1 each under the tongue as needed.    [provider]  metFORMIN (GLUCOPHAGE-XR) 500 MG 24 hr tablet TAKE 1 TABLET BY MOUTH EVERY DAY WITH BREAKFAST 10/09/21   Biagio Borg, MD  Multiple Vitamin (MULTIVITAMIN WITH MINERALS) TABS tablet Take 1 tablet by mouth daily.    [provider]  potassium chloride (KLOR-CON M10) 10 MEQ tablet TAKE 1 TABLET BY MOUTH EVERY DAY 10/09/21   Biagio Borg, MD  vitamin B-12 (CYANOCOBALAMIN) 1000 MCG tablet Take 1,000 mcg by mouth daily.    [provider]  vitamin C (ASCORBIC ACID) 500 MG tablet Take 500 mg by mouth daily.     [provider]    Family History Family History  Problem Relation Age of Onset   Lung cancer Paternal Uncle    Heart disease Mother    Brain cancer Mother    Cancer Maternal Grandmother         colon   Stroke Other    Cancer Other        x 4 aunts   Lung cancer Maternal Aunt    Suicidality Brother     Social History Social History   Tobacco Use   Smoking status: Some Days    Packs/day: 0.50    Years: 47.00    Pack years: 23.50    Types: Cigarettes    Start date: 12/29/1971    Last attempt to quit: 05/22/2019    Years since quitting: 2.4   Smokeless tobacco: Never   Tobacco comments:    Once she starts feeling better she begins the cycle of smoking again, until she feels bad again         2-3 per day 08/13/2021  Substance Use Topics  Alcohol use: Yes    Alcohol/week: 5.0 - 6.0 standard drinks    Types: 5 - 6 Glasses of wine per week   Drug use: Not Currently    Types: Marijuana    Comment: as a teenager     Allergies   Augmentin [amoxicillin-pot clavulanate]   Review of Systems Review of Systems  Constitutional:  Negative for appetite change, chills and fever.  HENT:  Positive for congestion. Negative for ear pain, rhinorrhea, sinus pressure, sinus pain and sore throat.   Eyes:  Negative for redness and visual disturbance.  Respiratory:  Positive for cough. Negative for chest tightness, shortness of breath and wheezing.   Cardiovascular:  Negative for chest pain and palpitations.  Gastrointestinal:  Negative for abdominal pain, constipation, diarrhea, nausea and vomiting.  Genitourinary:  Negative for dysuria, frequency and urgency.  Musculoskeletal:  Negative for myalgias.  Neurological:  Negative for dizziness, weakness and headaches.  Psychiatric/Behavioral:  Negative for confusion.   All other systems reviewed and are negative.   Physical Exam Triage Vital Signs ED Triage Vitals [10/19/21 1135]  Enc Vitals Group     BP 134/72     Pulse Rate 73     Resp 18     Temp 98.5 F (36.9 C)     Temp Source Oral     SpO2 94 %     Weight      Height      Head Circumference      Peak Flow      Pain Score 0     Pain Loc      Pain Edu?      Excl.  in [place]?    No data found.  Updated Vital Signs BP 134/72 (BP Location: Left Arm)    Pulse 73    Temp 98.5 F (36.9 C) (Oral)    Resp 18    SpO2 94%   Visual Acuity Right Eye Distance:   Left Eye Distance:   Bilateral Distance:    Right Eye Near:   Left Eye Near:    Bilateral Near:     Physical Exam Vitals reviewed.  Constitutional:      General: She is not in acute distress.    Appearance: Normal appearance. She is not ill-appearing.  HENT:     Head: Normocephalic and atraumatic.     Right Ear: Tympanic membrane, ear canal and external ear normal. No tenderness. No middle ear effusion. There is no impacted cerumen. Tympanic membrane is not perforated, erythematous, retracted or bulging.     Left Ear: Tympanic membrane, ear canal and external ear normal. No tenderness.  No middle ear effusion. There is no impacted cerumen. Tympanic membrane is not perforated, erythematous, retracted or bulging.     Nose: Nose normal. No congestion.     Mouth/Throat:     Mouth: Mucous membranes are moist.     Pharynx: Uvula midline. No oropharyngeal exudate or posterior oropharyngeal erythema.  Eyes:     Extraocular Movements: Extraocular movements intact.     Pupils: Pupils are equal, round, and reactive to light.  Cardiovascular:     Rate and Rhythm: Normal rate and regular rhythm.     Heart sounds: Normal heart sounds.  Pulmonary:     Effort: Pulmonary effort is normal.     Breath sounds: Wheezing present. No decreased breath sounds, rhonchi or rales.     Comments: Faint expiratory wheezes throughout  Pt on 3L home oxygen Abdominal:  Palpations: Abdomen is soft.     Tenderness: There is no abdominal tenderness. There is no guarding or rebound.  Lymphadenopathy:     Cervical: No cervical adenopathy.     Right cervical: No superficial cervical adenopathy.    Left cervical: No superficial cervical adenopathy.  Neurological:     General: No focal deficit present.     Mental Status:  She is alert and oriented to person, place, and time.  Psychiatric:        Mood and Affect: Mood normal.        Behavior: Behavior normal.        Thought Content: Thought content normal.        Judgment: Judgment normal.     UC Treatments / Results  Labs (all labs ordered are listed, but only abnormal results are displayed) Labs Reviewed  COVID-19, FLU A+B NAA    EKG   Radiology No results found.  Procedures Procedures (including critical care time)  Medications Ordered in UC Medications - No data to display  Initial Impression / Assessment and Plan / UC Course  I have reviewed the triage vital signs and the nursing notes.  Pertinent labs & imaging results that were available during my care of the patient were reviewed by me and considered in my medical decision making (see chart for details).     This patient is a very pleasant 65 y.o. year old female presenting with viral syndrome x3 days.  History of COPD, currently using albuterol inhaler and nebulizer twice daily, and steroid inhaler as directed.  On 3 L of home oxygen at baseline, no changes in this.  Was treated with doxycycline for COPD exacerbation 1 week ago by pulm, states she was not diagnosed with a bacterial infection.  Afebrile, nontachycardic today.  There is no bacterial infection today.  Prednisone taper sent for COPD exacerbation.  COVID PCR sent.  ED return precautions discussed. Patient verbalizes understanding and agreement.   Level 4 for acute exacerbation of chronic condition and prescription drug management..   Final Clinical Impressions(s) / UC Diagnoses   Final diagnoses:  COPD exacerbation ([place])     Discharge Instructions      -Prednisone taper for cough/bronchitis. I recommend taking this in the morning as it could give you energy.  Avoid NSAIDs like ibuprofen and alleve while taking this medication as they can increase your risk of stomach upset and even GI bleeding when in  combination with a steroid. You can continue tylenol (acetaminophen) up to 1000mg  3x daily. -You don't have a bacterial sinus, ear, or lung infection today.  -If symptoms get worse instead of better - shortness of breath, chest pain, dizziness, new fevers/chills, new facial pain - seek additional medical attention.  -Continue the home oxygen.  -Continue inhaler and nebulizer.     ED Prescriptions     Medication Sig Dispense Auth. Provider   predniSONE (STERAPRED UNI-PAK 21 TAB) 10 MG (21) TBPK tablet Take by mouth daily. Take 6 tabs by mouth daily  for 2 days, then 5 tabs for 2 days, then 4 tabs for 2 days, then 3 tabs for 2 days, 2 tabs for 2 days, then 1 tab by mouth daily for 2 days 42 tablet Hazel Sams, PA-C      PDMP not reviewed this encounter.   Hazel Sams, PA-C 10/19/21 1253

## 2021-10-20 LAB — COVID-19, FLU A+B NAA
Influenza A, NAA: NOT DETECTED
Influenza B, NAA: NOT DETECTED
SARS-CoV-2, NAA: NOT DETECTED

## 2021-10-29 ENCOUNTER — Ambulatory Visit (INDEPENDENT_AMBULATORY_CARE_PROVIDER_SITE_OTHER): Payer: Medicare HMO | Admitting: Internal Medicine

## 2021-10-29 ENCOUNTER — Other Ambulatory Visit: Payer: Self-pay

## 2021-10-29 ENCOUNTER — Encounter: Payer: Self-pay | Admitting: Internal Medicine

## 2021-10-29 VITALS — BP 128/62 | HR 68 | Temp 98.3°F | Ht 61.0 in | Wt 176.0 lb

## 2021-10-29 DIAGNOSIS — E559 Vitamin D deficiency, unspecified: Secondary | ICD-10-CM

## 2021-10-29 DIAGNOSIS — J0140 Acute pansinusitis, unspecified: Secondary | ICD-10-CM | POA: Diagnosis not present

## 2021-10-29 DIAGNOSIS — Z720 Tobacco use: Secondary | ICD-10-CM

## 2021-10-29 DIAGNOSIS — J9611 Chronic respiratory failure with hypoxia: Secondary | ICD-10-CM

## 2021-10-29 DIAGNOSIS — E538 Deficiency of other specified B group vitamins: Secondary | ICD-10-CM

## 2021-10-29 DIAGNOSIS — J441 Chronic obstructive pulmonary disease with (acute) exacerbation: Secondary | ICD-10-CM

## 2021-10-29 DIAGNOSIS — E1165 Type 2 diabetes mellitus with hyperglycemia: Secondary | ICD-10-CM | POA: Diagnosis not present

## 2021-10-29 MED ORDER — HYDROCODONE BIT-HOMATROP MBR 5-1.5 MG/5ML PO SOLN: 5.0000 mL | Freq: Four times a day (QID) | ORAL | 0 refills | Status: AC | PRN

## 2021-10-29 MED ORDER — PREDNISONE 10 MG PO TABS: ORAL_TABLET | ORAL | 0 refills | Status: DC

## 2021-10-29 MED ORDER — LEVOFLOXACIN 500 MG PO TABS: 500.0000 mg | ORAL_TABLET | Freq: Every day | ORAL | 0 refills | Status: AC

## 2021-10-29 NOTE — Progress Notes (Addendum)
Patient ID: [name], female   DOB: January 20, 1956, 66 y.o.   MRN: [id number]        Chief Complaint: follow up sob and wheezing       HPI:  [name] is a 66 y.o. female Here with acute onset mild to mod 2-3 days ST, HA, general weakness and malaise, with prod cough greenish sputum, but Pt denies chest pain, increased sob or doe, wheezing, orthopnea, PND, increased LE swelling, palpitations, dizziness or syncope, except for mild to mod sob and wheezing for the last day.    Pt denies polydipsia, polyuria, or new focal neuro s/s.   Pt denies fever, wt loss, night sweats, loss of appetite, or other constitutional symptoms  Still smoking, not ready to quit.       Wt Readings from Last 3 Encounters:  10/29/21 176 lb (79.8 kg)  10/04/21 177 lb (80.3 kg)  08/13/21 175 lb (79.4 kg)   BP Readings from Last 3 Encounters:  10/29/21 128/62  10/19/21 134/72  10/04/21 118/64         Past Medical History:  Diagnosis Date   Bronchitis    CAD (coronary artery disease)    a. 02/2002: CABG x3 with LIMA to LAD, RIMA to RCA, and SVG to OM   CHF (congestive heart failure) ([place])    COPD (chronic obstructive pulmonary disease) ([place])    Diabetes ([place])    Headache    Heart disease    Hyperlipidemia    Hypertension    Impaired glucose tolerance 08/20/2014   Sinusitis    Past Surgical History:  Procedure Laterality Date   CORONARY ARTERY BYPASS GRAFT  2003   Triple bypass   ESOPHAGOGASTRODUODENOSCOPY Left 06/13/2014   Procedure: ESOPHAGOGASTRODUODENOSCOPY (EGD);  Surgeon: Juanita Craver, MD;  Location[facility] ENDOSCOPY;  Service: Endoscopy;  Laterality: Left;   RIGID ESOPHAGOSCOPY N/A 06/12/2014   Procedure: RIGID ESOPHAGOSCOPY WITH FOREIGN BODY REMOVAL;  Surgeon: Ascencion Dike, MD;  Location: [place];  Service: ENT;  Laterality: N/A;    reports that she has been smoking cigarettes. She started smoking about 49 years ago. She has a 23.50 pack-year smoking history. She has never used smokeless tobacco. She  reports current alcohol use of about 5.0 - 6.0 standard drinks per week. She reports that she does not currently use drugs after having used the following drugs: Marijuana. family history includes Brain cancer in her mother; Cancer in her maternal grandmother and another family member; Heart disease in her mother; Lung cancer in her maternal aunt and paternal uncle; Stroke in an other family member; Suicidality in her brother. Allergies  Allergen Reactions   Augmentin [Amoxicillin-Pot Clavulanate] Nausea And Vomiting   Current Outpatient Medications on File Prior to Visit  Medication Sig Dispense Refill   albuterol (VENTOLIN HFA) 108 (90 Base) MCG/ACT inhaler INHALE 1-2 PUFFS BY MOUTH EVERY 6 HOURS AS NEEDED FOR WHEEZE OR SHORTNESS OF BREATH 18 each 1   amLODipine (NORVASC) 5 MG tablet Take 1 tablet (5 mg total) by mouth daily. 90 tablet 2   atorvastatin (LIPITOR) 40 MG tablet TAKE 1 TABLET BY MOUTH EVERY DAY 90 tablet 1   carvedilol (COREG) 12.5 MG tablet TAKE 1 TABLET BY MOUTH TWICE A DAY 180 tablet 3   cholecalciferol (VITAMIN D) 1000 units tablet Take 1,000 Units by mouth daily.     Dulaglutide (TRULICITY) 5.36 UY/4.0HK SOPN Inject 0.75 mg into the skin once a week. 6 mL 3   Fluticasone-Umeclidin-Vilant (TRELEGY ELLIPTA) 200-62.5-25  MCG/INH AEPB Inhale 1 puff into the lungs daily. 1 each 11   furosemide (LASIX) 40 MG tablet TAKE 1 TABLET BY MOUTH TWICE A DAY 180 tablet 1   ipratropium-albuterol (DUONEB) 0.5-2.5 (3) MG/3ML SOLN Take 3 mLs by nebulization every 6 (six) hours as needed. 360 mL 11   isosorbide mononitrate (IMDUR) 60 MG 24 hr tablet Take 1.5 tablets (90 mg total) by mouth daily. 135 tablet 1   Melatonin 10 MG SUBL Place 1 each under the tongue as needed.     metFORMIN (GLUCOPHAGE-XR) 500 MG 24 hr tablet TAKE 1 TABLET BY MOUTH EVERY DAY WITH BREAKFAST 90 tablet 1   Multiple Vitamin (MULTIVITAMIN WITH MINERALS) TABS tablet Take 1 tablet by mouth daily.     potassium chloride  (KLOR-CON M10) 10 MEQ tablet TAKE 1 TABLET BY MOUTH EVERY DAY 90 tablet 1   vitamin B-12 (CYANOCOBALAMIN) 1000 MCG tablet Take 1,000 mcg by mouth daily.     vitamin C (ASCORBIC ACID) 500 MG tablet Take 500 mg by mouth daily.      Current Facility-Administered Medications on File Prior to Visit  Medication Dose Route Frequency Provider Last Rate Last Admin   nicotine (NICOTROL) 10 MG inhaler 1 continuous puffing  1 continuous puffing Inhalation PRN Rigoberto Noel, MD       regadenoson (LEXISCAN) injection SOLN 0.4 mg  0.4 mg Intravenous Once Fay Records, MD       technetium tetrofosmin (TC-MYOVIEW) injection 30 millicurie  30 millicurie Intravenous Once PRN Fay Records, MD            ROS:  All others reviewed and negative.  Objective        PE:  BP 128/62 (BP Location: Left Arm, Patient Position: Sitting, Cuff Size: Normal)    Pulse 68    Temp 98.3 F (36.8 C) (Oral)    Ht 5\' 1"  (1.549 m)    Wt 176 lb (79.8 kg)    SpO2 95%    BMI 33.25 kg/m                 Constitutional: Pt appears in NAD               HENT: Head: NCAT.                Right Ear: External ear normal.                 Left Ear: External ear normal. Bilat tm's with mild erythema.  Max sinus areas non tender.  Pharynx with mild erythema, no exudate               Eyes: . Pupils are equal, round, and reactive to light. Conjunctivae and EOM are normal               Nose: without d/c or deformity               Neck: Neck supple. Gross normal ROM               Cardiovascular: Normal rate and regular rhythm.                 Pulmonary/Chest: Effort normal and breath sounds without rales but with bilateral mod wheezing.                Abd:  Soft, NT, ND, + BS, no organomegaly               Neurological:  Pt is alert. At baseline orientation, motor grossly intact               Skin: Skin is warm. No rashes, no other new lesions, LE edema - none               Psychiatric: Pt behavior is normal without agitation   Micro:  none  Cardiac tracings I have personally interpreted today:  none  Pertinent Radiological findings (summarize): none   Lab Results  Component Value Date   WBC 7.8 04/13/2021   HGB 13.9 04/13/2021   HCT 41.9 04/13/2021   PLT 161.0 04/13/2021   GLUCOSE 90 04/13/2021   CHOL 156 04/13/2021   TRIG 255.0 (H) 04/13/2021   HDL 45.70 04/13/2021   LDLDIRECT 67.0 04/13/2021   LDLCALC 68 02/02/2014   ALT 25 04/13/2021   AST 16 04/13/2021   NA 142 04/13/2021   K 3.6 04/13/2021   CL 95 (L) 04/13/2021   CREATININE 0.42 04/13/2021   BUN 11 04/13/2021   CO2 40 (H) 04/13/2021   TSH 2.81 04/13/2021   HGBA1C 6.3 04/13/2021   MICROALBUR 3.4 (H) 04/13/2021   Assessment/Plan:  [name] is a 66 y.o. Other or two or more races [6] female with  has a past medical history of Bronchitis, CAD (coronary artery disease), CHF (congestive heart failure) ([place]), COPD (chronic obstructive pulmonary disease) ([place]), Diabetes ([place]), Headache, Heart disease, Hyperlipidemia, Hypertension, Impaired glucose tolerance (08/20/2014), and Sinusitis.  Chronic respiratory failure with hypoxia (HCC) Stable, continue home o2  COPD exacerbation (HCC) Mild to mod, for antibx course,cough med prn, prednisone asd,  to f/u any worsening symptoms or concerns  Diabetes ([place]) Lab Results  Component Value Date   HGBA1C 6.3 04/13/2021   Stable, pt to continue current medical treatment trulicity, metformin   Sinusitis, acute Mild to mod, for antibx course,  to f/u any worsening symptoms or concerns  Tobacco abuse Pt counsled to quit, not ready to quit  Vitamin D deficiency Last vitamin D Lab Results  Component Value Date   VD25OH 23.31 (L) 04/13/2021   Low, to start oral replacement  Followup: Return if symptoms worsen or fail to improve.  Cathlean Cower, MD 10/29/2021 7:53 PM [facility]

## 2021-10-29 NOTE — Assessment & Plan Note (Signed)
Pt counsled to quit, not ready to quit

## 2021-10-29 NOTE — Assessment & Plan Note (Signed)
Mild to mod, for antibx course,  to f/u any worsening symptoms or concerns 

## 2021-10-29 NOTE — Assessment & Plan Note (Addendum)
Mild to mod, for antibx course,cough med prn, prednisone asd, depomedrol im 80 mg, to f/u any worsening symptoms or concerns

## 2021-10-29 NOTE — Assessment & Plan Note (Signed)
Last vitamin D Lab Results  Component Value Date   VD25OH 23.31 (L) 04/13/2021   Low, to start oral replacement

## 2021-10-29 NOTE — Addendum Note (Signed)
Addended by: Biagio Borg on: 10/29/2021 07:53 PM   Modules accepted: Orders

## 2021-10-29 NOTE — Assessment & Plan Note (Signed)
Lab Results  Component Value Date   HGBA1C 6.3 04/13/2021   Stable, pt to continue current medical treatment trulicity, metformin

## 2021-10-29 NOTE — Assessment & Plan Note (Signed)
Stable, continue home o2

## 2021-10-29 NOTE — Patient Instructions (Signed)
You had the steroid shot today  Please take all new medication as prescribed - the antibiotic, cough medicine, and prednisone  Please continue all other medications as before, and refills have been done if requested.  Please have the pharmacy call with any other refills you may need.  Please keep your appointments with your specialists as you may have planned   

## 2021-10-30 DIAGNOSIS — J9611 Chronic respiratory failure with hypoxia: Secondary | ICD-10-CM | POA: Diagnosis not present

## 2021-10-30 DIAGNOSIS — E1165 Type 2 diabetes mellitus with hyperglycemia: Secondary | ICD-10-CM | POA: Diagnosis not present

## 2021-10-30 DIAGNOSIS — J0140 Acute pansinusitis, unspecified: Secondary | ICD-10-CM | POA: Diagnosis not present

## 2021-10-30 DIAGNOSIS — J441 Chronic obstructive pulmonary disease with (acute) exacerbation: Secondary | ICD-10-CM | POA: Diagnosis not present

## 2021-10-30 DIAGNOSIS — E538 Deficiency of other specified B group vitamins: Secondary | ICD-10-CM | POA: Diagnosis not present

## 2021-10-30 DIAGNOSIS — Z720 Tobacco use: Secondary | ICD-10-CM | POA: Diagnosis not present

## 2021-10-30 DIAGNOSIS — E559 Vitamin D deficiency, unspecified: Secondary | ICD-10-CM | POA: Diagnosis not present

## 2021-10-30 MED ORDER — METHYLPREDNISOLONE ACETATE 80 MG/ML IJ SUSP
80.0000 mg | Freq: Once | INTRAMUSCULAR | Status: AC
  Administered 2021-10-30: 80 mg via INTRAMUSCULAR

## 2021-10-30 NOTE — Addendum Note (Signed)
Addended by: Durwin Nora on: 10/30/2021 04:27 PM   Modules accepted: Orders

## 2021-11-05 ENCOUNTER — Other Ambulatory Visit: Payer: Self-pay | Admitting: Pulmonary Disease

## 2021-11-10 ENCOUNTER — Encounter: Payer: Self-pay | Admitting: Internal Medicine

## 2021-11-14 DIAGNOSIS — J449 Chronic obstructive pulmonary disease, unspecified: Secondary | ICD-10-CM | POA: Diagnosis not present

## 2021-11-27 ENCOUNTER — Other Ambulatory Visit: Payer: Self-pay | Admitting: Pulmonary Disease

## 2021-12-02 ENCOUNTER — Other Ambulatory Visit: Payer: Self-pay | Admitting: Pulmonary Disease

## 2021-12-15 DIAGNOSIS — J449 Chronic obstructive pulmonary disease, unspecified: Secondary | ICD-10-CM | POA: Diagnosis not present

## 2021-12-25 ENCOUNTER — Other Ambulatory Visit: Payer: Self-pay | Admitting: Internal Medicine

## 2021-12-25 MED ORDER — TRULICITY 0.75 MG/0.5ML ~~LOC~~ SOAJ: 0.7500 mg | SUBCUTANEOUS | 3 refills | Status: DC

## 2022-01-02 ENCOUNTER — Encounter: Payer: Self-pay | Admitting: Pulmonary Disease

## 2022-01-02 ENCOUNTER — Other Ambulatory Visit: Payer: Self-pay

## 2022-01-02 ENCOUNTER — Ambulatory Visit (INDEPENDENT_AMBULATORY_CARE_PROVIDER_SITE_OTHER): Payer: Medicare HMO | Admitting: Pulmonary Disease

## 2022-01-02 VITALS — BP 114/62 | HR 66 | Temp 98.3°F | Ht 61.0 in | Wt 180.0 lb

## 2022-01-02 DIAGNOSIS — J9611 Chronic respiratory failure with hypoxia: Secondary | ICD-10-CM

## 2022-01-02 DIAGNOSIS — J449 Chronic obstructive pulmonary disease, unspecified: Secondary | ICD-10-CM

## 2022-01-02 MED ORDER — SODIUM CHLORIDE 3 % IN NEBU: INHALATION_SOLUTION | RESPIRATORY_TRACT | 12 refills | Status: DC | PRN

## 2022-01-02 MED ORDER — ROFLUMILAST 250 MCG PO TABS: 1.0000 | ORAL_TABLET | Freq: Every day | ORAL | 11 refills | Status: DC

## 2022-01-02 MED ORDER — PREDNISONE 5 MG PO TABS: 5.0000 mg | ORAL_TABLET | Freq: Every day | ORAL | 1 refills | Status: DC

## 2022-01-02 MED ORDER — NICOTINE 10 MG IN INHA: 1.0000 | RESPIRATORY_TRACT | 0 refills | Status: DC | PRN

## 2022-01-02 NOTE — Progress Notes (Signed)
? ?  Subjective:  ? ? Patient ID: [name], female    DOB: 1955/11/11, 66 y.o.   MRN: [id number] ? ?HPI ? ?66 yo smoker for FU of COPD, quit 11/2017 and mild tracheomalacia with chronic hypoxic resp failure on o2 since 2018 ? ?Recurrent exacerbations in 2022, requiring maintenance prednisone starting 06/2021.  She was able to taper down to 10 mg alternating with 5 mg but in February had a flareup and required 20 mg for a week. ?She is now back on the alternating dose.  She wants to discuss side effects of steroids. ?She feels like she has phlegm but cannot bring this up. ?She is compliant with Trelegy use albuterol nebs twice daily. ?She is compliant with Lasix ? ?Chest x-ray 07/2021 shows hyperinflation and chronic bronchitis ? ?Significant tests/ events reviewed ? ?  ?CT angiogram chest 01/2020-emphysema, mild tracheomalacia ?  ?04/2019-pulmonary function test- FVC 1.05 (44% predicted), postbronchodilator ratio 47, postbronchodilator FEV1 0.47 (25% predicted), DLCO 54 ?  ?Spirometry 02/2017 >> ratio of 59, FEV1 of 42% FVC of 56% ?ABG 01/2014 was 7.29/66/75/92% ?  ?08/2014 >> PSG neg for OSA , Desaturation as low as 85% ? ? ?Past Medical History:  ?Diagnosis Date  ? Bronchitis   ? CAD (coronary artery disease)   ? a. 02/2002: CABG x3 with LIMA to LAD, RIMA to RCA, and SVG to OM  ? CHF (congestive heart failure) ([place])   ? COPD (chronic obstructive pulmonary disease) ([place])   ? Diabetes ([place])   ? Headache   ? Heart disease   ? Hyperlipidemia   ? Hypertension   ? Impaired glucose tolerance 08/20/2014  ? Sinusitis   ? ? ?Review of Systems ?neg for any significant sore throat, dysphagia, itching, sneezing, nasal congestion or excess/ purulent secretions, fever, chills, sweats, unintended wt loss, pleuritic or exertional cp, hempoptysis, orthopnea pnd or change in chronic leg swelling. Also denies presyncope, palpitations, heartburn, abdominal pain, nausea, vomiting, diarrhea or change in bowel or urinary habits,  dysuria,hematuria, rash, arthralgias, visual complaints, headache, numbness weakness or ataxia. ? ?   ?Objective:  ? Physical Exam ? ?Gen. Pleasant, obese, in no distress ?ENT - no lesions, no post nasal drip, steroid facies ?Neck: No JVD, no thyromegaly, no carotid bruits ?Lungs: no use of accessory muscles, no dullness to percussion, decreased without rales or rhonchi  ?Cardiovascular: Rhythm regular, heart sounds  normal, no murmurs or gallops, no peripheral edema ?Musculoskeletal: No deformities, no cyanosis or clubbing , no tremors ?  ? ? ?   ?Assessment & Plan:  ? ? ?

## 2022-01-02 NOTE — Patient Instructions (Signed)
?  X decrease prednisone to 5 mg daily ? ?X Rx for daliresp 250  daily x 30 x 1 refilll ? ?X Refer to pulm rehab ? ?X Rx for hypertonic saline nebs # 30 x 1 refill ?

## 2022-01-02 NOTE — Assessment & Plan Note (Addendum)
Continue Trelegy. ?Use albuterol for breakthrough. ?We discussed long-term side effects of steroids, unfortunately nothing else is worked for her.  We will drop her to 5 mg daily hoping that this low-dose will control her symptoms and keep her free of exacerbations. ?We will add Roflumilast -I discussed GI side effects ? ?If above does not work, we can consider NIV in the future, will need ABG ?We will also add hypertonic saline nebs to help with airway clearance ?

## 2022-01-02 NOTE — Assessment & Plan Note (Signed)
Continue oxygen 24/7. ?Referral to pulmonary rehab -she is willing to access center-based rehab and we discussed breaking the cycle of deconditioning and shortness of breath ?

## 2022-01-04 ENCOUNTER — Other Ambulatory Visit: Payer: Self-pay | Admitting: Pulmonary Disease

## 2022-01-04 ENCOUNTER — Encounter (HOSPITAL_COMMUNITY): Payer: Self-pay | Admitting: *Deleted

## 2022-01-04 NOTE — Telephone Encounter (Signed)
NICOTROL 10 MG inhaler  ?    ? Changed from: nicotine (NICOTROL) 10 MG inhaler  ?  Possible duplicate: Hover to review recent actions on this medication  ? Sig: INHALE 1 CARTRIDGE (1 CONTINUOUS PUFFING TOTAL) INTO THE LUNGS AS NEEDED FOR SMOKING CESSATION.  ? Disp:  168 each    Refills:  0  ? Start: 01/04/2022  ? Class: Normal  ? Non-formulary  ? Last ordered: 2 days ago by Rigoberto Noel, MD Last refill: 01/02/2022  ? Rx #: [id number]  ? Pharmacy comment: Product Backordered/Unavailable:NOT AVAILABLE PLEASE ADVISE  ? ? ? ?Dr. Elsworth Soho, please advise. ?

## 2022-01-04 NOTE — Progress Notes (Signed)
Received referral from Dr. Elsworth Soho for this pt to participate in pulmonary rehab with the diagnosis of  [place] COPD Stage IV. Pt completed full PFT which showed FEV1/FVC 47; FEV1 Post BD 25. Clinical review of pt follow up appt on 3/15 with [facility] office note.  Pt with Covid Risk Score - 6. Pt appropriate for scheduling for Pulmonary rehab.  Will forward to support staff for scheduling.  Cherre Huger, BSN ?Cardiac and Pulmonary Rehab Nurse Navigator  ? ?

## 2022-01-07 ENCOUNTER — Encounter: Payer: Self-pay | Admitting: Pulmonary Disease

## 2022-01-07 ENCOUNTER — Other Ambulatory Visit: Payer: Self-pay | Admitting: Pulmonary Disease

## 2022-01-07 NOTE — Telephone Encounter (Signed)
Dr. Elsworth Soho, ?Please see patient message and advise.  Thank you. ? ?

## 2022-01-07 NOTE — Telephone Encounter (Signed)
ipratropium (ATROVENT) 0.02 % nebulizer solution  ?    ? Changed from: ipratropium-albuterol (DUONEB) 0.5-2.5 (3) MG/3ML SOLN  ? Sig: Please specify directions, refills and quantity  ? Disp:  Not specified    Refills:  0  ? Start: 01/07/2022  ? Class: Normal  ? Last ordered: 4 months ago by Rigoberto Noel, MD Last refill: 01/06/2022  ? Rx #: [id number]  ? Pharmacy comment: Product Backordered/Unavailable:BACKORDER PLEASE ADVISE.  ? ? ?Dr. Elsworth Soho, please advise. ?

## 2022-01-08 MED ORDER — PREDNISONE 10 MG PO TABS: 10.0000 mg | ORAL_TABLET | Freq: Every day | ORAL | 1 refills | Status: DC

## 2022-01-09 ENCOUNTER — Other Ambulatory Visit: Payer: Self-pay | Admitting: Pulmonary Disease

## 2022-01-10 ENCOUNTER — Encounter: Payer: Self-pay | Admitting: Pulmonary Disease

## 2022-01-10 ENCOUNTER — Other Ambulatory Visit: Payer: Self-pay | Admitting: Pulmonary Disease

## 2022-01-11 MED ORDER — NICOTINE 10 MG IN INHA: RESPIRATORY_TRACT | 0 refills | Status: DC

## 2022-01-12 DIAGNOSIS — J449 Chronic obstructive pulmonary disease, unspecified: Secondary | ICD-10-CM | POA: Diagnosis not present

## 2022-01-14 ENCOUNTER — Other Ambulatory Visit: Payer: Self-pay | Admitting: Pulmonary Disease

## 2022-01-15 DIAGNOSIS — I25119 Atherosclerotic heart disease of native coronary artery with unspecified angina pectoris: Secondary | ICD-10-CM | POA: Diagnosis not present

## 2022-01-15 DIAGNOSIS — J449 Chronic obstructive pulmonary disease, unspecified: Secondary | ICD-10-CM | POA: Diagnosis not present

## 2022-01-15 DIAGNOSIS — Z6834 Body mass index (BMI) 34.0-34.9, adult: Secondary | ICD-10-CM | POA: Diagnosis not present

## 2022-01-15 DIAGNOSIS — I509 Heart failure, unspecified: Secondary | ICD-10-CM | POA: Diagnosis not present

## 2022-01-15 DIAGNOSIS — Z7951 Long term (current) use of inhaled steroids: Secondary | ICD-10-CM | POA: Diagnosis not present

## 2022-01-15 DIAGNOSIS — E669 Obesity, unspecified: Secondary | ICD-10-CM | POA: Diagnosis not present

## 2022-01-15 DIAGNOSIS — Z7952 Long term (current) use of systemic steroids: Secondary | ICD-10-CM | POA: Diagnosis not present

## 2022-01-15 DIAGNOSIS — E785 Hyperlipidemia, unspecified: Secondary | ICD-10-CM | POA: Diagnosis not present

## 2022-01-15 DIAGNOSIS — Z008 Encounter for other general examination: Secondary | ICD-10-CM | POA: Diagnosis not present

## 2022-01-15 DIAGNOSIS — J9610 Chronic respiratory failure, unspecified whether with hypoxia or hypercapnia: Secondary | ICD-10-CM | POA: Diagnosis not present

## 2022-01-15 DIAGNOSIS — I110 Hypertensive heart disease with heart failure: Secondary | ICD-10-CM | POA: Diagnosis not present

## 2022-01-15 DIAGNOSIS — Z720 Tobacco use: Secondary | ICD-10-CM | POA: Diagnosis not present

## 2022-01-15 DIAGNOSIS — E119 Type 2 diabetes mellitus without complications: Secondary | ICD-10-CM | POA: Diagnosis not present

## 2022-01-16 MED ORDER — ROFLUMILAST 250 MCG PO TABS: 1.0000 | ORAL_TABLET | Freq: Every day | ORAL | 11 refills | Status: DC

## 2022-01-23 ENCOUNTER — Telehealth: Payer: Self-pay | Admitting: Internal Medicine

## 2022-01-23 MED ORDER — TRULICITY 0.75 MG/0.5ML ~~LOC~~ SOAJ: 0.7500 mg | SUBCUTANEOUS | 0 refills | Status: DC

## 2022-01-23 NOTE — Telephone Encounter (Signed)
Pt states a rx fDulaglutide (TRULICITY) 5.95 GL/8.7FI SOPN for was suppose to be sent to [facility] which has not been received ? ?Pt requesting a 30 day supply sent to [id number] [place] [place] ? ?And the remainder sent to [facility]  ?

## 2022-01-29 ENCOUNTER — Encounter (HOSPITAL_COMMUNITY): Payer: Self-pay

## 2022-01-29 ENCOUNTER — Other Ambulatory Visit: Payer: Self-pay | Admitting: Cardiology

## 2022-01-29 ENCOUNTER — Other Ambulatory Visit: Payer: Self-pay | Admitting: Pulmonary Disease

## 2022-01-30 NOTE — Telephone Encounter (Signed)
Prescription sent to pharmacy 01/23/22 ?

## 2022-01-31 ENCOUNTER — Telehealth: Payer: Self-pay | Admitting: Pulmonary Disease

## 2022-01-31 MED ORDER — PREDNISONE 10 MG PO TABS: 10.0000 mg | ORAL_TABLET | Freq: Every day | ORAL | 0 refills | Status: DC

## 2022-01-31 NOTE — Telephone Encounter (Signed)
Called and spoke with patient. She is aware that [facility] has approved the prednisone RX. [facility] [address]  ? ?Nothing further needed at time of call.  ?

## 2022-01-31 NOTE — Telephone Encounter (Signed)
Called and spoke with patient. She stated that she is in need of a refill on her prednisone. She confirmed that she was on prednisone '5mg'$  daily but had to increase back to the '10mg'$  daily due to her increased SOB. I made her aware that a refill was sent on 01/29/22 for the prednisone '5mg'$  for 30 tablets but she stated that this would not be enough. She does not have any refills on the prednisone '10mg'$ .  ? ?She wants to know if she can have a refill on the prednisone '10mg'$ .  ? ?When I asked her about her increased SOB, she stated it was her usual SOB due to weather. Denied any fever, body aches or coughing.  ? ?[facility] [address]  ? ?Dr. Elsworth Soho, please advise. Thanks!  ? ?

## 2022-02-06 ENCOUNTER — Other Ambulatory Visit: Payer: Self-pay | Admitting: Cardiology

## 2022-02-11 ENCOUNTER — Ambulatory Visit (INDEPENDENT_AMBULATORY_CARE_PROVIDER_SITE_OTHER): Payer: Medicare HMO | Admitting: Internal Medicine

## 2022-02-11 VITALS — BP 118/70 | HR 71 | Temp 98.1°F | Ht 61.0 in | Wt 185.0 lb

## 2022-02-11 DIAGNOSIS — E1165 Type 2 diabetes mellitus with hyperglycemia: Secondary | ICD-10-CM | POA: Diagnosis not present

## 2022-02-11 DIAGNOSIS — Z720 Tobacco use: Secondary | ICD-10-CM | POA: Diagnosis not present

## 2022-02-11 DIAGNOSIS — Z0001 Encounter for general adult medical examination with abnormal findings: Secondary | ICD-10-CM | POA: Diagnosis not present

## 2022-02-11 DIAGNOSIS — E559 Vitamin D deficiency, unspecified: Secondary | ICD-10-CM | POA: Diagnosis not present

## 2022-02-11 DIAGNOSIS — R519 Headache, unspecified: Secondary | ICD-10-CM

## 2022-02-11 NOTE — Progress Notes (Signed)
Patient ID: [name], female   DOB: [birth year], 66 y.o.   MRN: [id number] ? ? ? ?     Chief Complaint:: wellness exam and Migraines ? , low vit d, dm ? ?     HPI:  [name] is a 66 y.o. female here for wellness exam; has eye exam next month; decliens shingrix, dxa, tdap, covid booster,  pneumovax, o/w up to date ?         ?              Also has ongoing anxiety, stress and recurring migraines improved with otc meds for now.  Taking low dose Vit D.  Pt denies chest pain, increased sob or doe, wheezing, orthopnea, PND, increased LE swelling, palpitations, dizziness or syncope.   Pt denies polydipsia, polyuria, or new focal neuro s/s.    Pt denies fever, wt loss, night sweats, loss of appetite, or other constitutional symptoms   Pt still smoking, not ready to quit.   ?  ?Wt Readings from Last 3 Encounters:  ?02/11/22 185 lb (83.9 kg)  ?01/02/22 180 lb (81.6 kg)  ?10/29/21 176 lb (79.8 kg)  ? ?BP Readings from Last 3 Encounters:  ?02/11/22 118/70  ?01/02/22 114/62  ?10/29/21 128/62  ? ?Immunization History  ?Administered Date(s) Administered  ? Influenza Split 07/08/2013  ? Influenza,inj,Quad PF,6+ Mos 07/31/2015, 09/03/2017, 10/04/2021  ? PFIZER Comirnaty(Gray Top)Covid-19 Tri-Sucrose Vaccine 08/31/2020  ? PFIZER(Purple Top)SARS-COV-2 Vaccination 01/01/2020, 01/25/2020  ? Pneumococcal Polysaccharide-23 02/02/2014, 05/03/2019  ? ?Health Maintenance Due  ?Topic Date Due  ? HEMOGLOBIN A1C  10/13/2021  ? URINE MICROALBUMIN  04/13/2022  ? ?  ? ?Past Medical History:  ?Diagnosis Date  ? Bronchitis   ? CAD (coronary artery disease)   ? a. 02/2002: CABG x3 with LIMA to LAD, RIMA to RCA, and SVG to OM  ? CHF (congestive heart failure) ([place])   ? COPD (chronic obstructive pulmonary disease) ([place])   ? Diabetes ([place])   ? Headache   ? Heart disease   ? Hyperlipidemia   ? Hypertension   ? Impaired glucose tolerance 08/20/2014  ? Sinusitis   ? ?Past Surgical History:  ?Procedure Laterality Date  ? CORONARY ARTERY BYPASS  GRAFT  2003  ? Triple bypass  ? ESOPHAGOGASTRODUODENOSCOPY Left 06/13/2014  ? Procedure: ESOPHAGOGASTRODUODENOSCOPY (EGD);  Surgeon: Juanita Craver, MD;  Location: [facility] ENDOSCOPY;  Service: Endoscopy;  Laterality: Left;  ? RIGID ESOPHAGOSCOPY N/A 06/12/2014  ? Procedure: RIGID ESOPHAGOSCOPY WITH FOREIGN BODY REMOVAL;  Surgeon: Ascencion Dike, MD;  Location: [place];  Service: ENT;  Laterality: N/A;  ? ? reports that she has been smoking cigarettes. She started smoking about 50 years ago. She has a 23.50 pack-year smoking history. She has never used smokeless tobacco. She reports current alcohol use of about 5.0 - 6.0 standard drinks per week. She reports that she does not currently use drugs after having used the following drugs: Marijuana. ?family history includes Brain cancer in her mother; Cancer in her maternal grandmother and another family member; Heart disease in her mother; Lung cancer in her maternal aunt and paternal uncle; Stroke in an other family member; Suicidality in her brother. ?Allergies  ?Allergen Reactions  ? Augmentin [Amoxicillin-Pot Clavulanate] Nausea And Vomiting  ? ?Current Outpatient Medications on File Prior to Visit  ?Medication Sig Dispense Refill  ? albuterol (VENTOLIN HFA) 108 (90 Base) MCG/ACT inhaler INHALE 1-2 PUFFS BY MOUTH EVERY 6 HOURS AS NEEDED FOR WHEEZE OR SHORTNESS OF BREATH  18 g 5  ? amLODipine (NORVASC) 5 MG tablet Take 1 tablet (5 mg total) by mouth daily. Patient needs appointment for # 180. Please call office at [phone] to schedule appointment. 30 tablet 0  ? atorvastatin (LIPITOR) 40 MG tablet TAKE 1 TABLET BY MOUTH EVERY DAY 90 tablet 1  ? carvedilol (COREG) 12.5 MG tablet Take 1 tablet (12.5 mg total) by mouth 2 (two) times daily. Patient needs appointment for # 180. Please call office at [phone] to schedule appointment. 60 tablet 0  ? cholecalciferol (VITAMIN D) 1000 units tablet Take 1,000 Units by mouth daily.    ? Dulaglutide (TRULICITY) 6.55 VZ/4.8OL SOPN Inject  0.75 mg into the skin once a week. 6 mL 0  ? Fluticasone-Umeclidin-Vilant (TRELEGY ELLIPTA) 200-62.5-25 MCG/INH AEPB Inhale 1 puff into the lungs daily. 1 each 11  ? furosemide (LASIX) 40 MG tablet TAKE 1 TABLET BY MOUTH TWICE A DAY 180 tablet 1  ? ipratropium (ATROVENT) 0.02 % nebulizer solution Please specify directions, refills and quantity 100 mL 5  ? isosorbide mononitrate (IMDUR) 60 MG 24 hr tablet Take 1.5 tablets (90 mg total) by mouth daily. 135 tablet 1  ? Melatonin 10 MG SUBL Place 1 each under the tongue as needed.    ? metFORMIN (GLUCOPHAGE-XR) 500 MG 24 hr tablet TAKE 1 TABLET BY MOUTH EVERY DAY WITH BREAKFAST 90 tablet 1  ? Multiple Vitamin (MULTIVITAMIN WITH MINERALS) TABS tablet Take 1 tablet by mouth daily.    ? nicotine (NICOTROL) 10 MG inhaler INHALE 1 CARTRIDGE (1 CONTINUOUS PUFFING TOTAL) INTO THE LUNGS AS NEEDED FOR SMOKING CESSATION. 168 each 0  ? potassium chloride (KLOR-CON M10) 10 MEQ tablet TAKE 1 TABLET BY MOUTH EVERY DAY 90 tablet 1  ? predniSONE (DELTASONE) 10 MG tablet Take 1 tablet (10 mg total) by mouth daily with breakfast. 30 tablet 0  ? predniSONE (DELTASONE) 5 MG tablet Take 1 tablet (5 mg total) by mouth daily with breakfast. 30 tablet 2  ? Roflumilast (DALIRESP) 250 MCG TABS Take 1 tablet by mouth daily. 30 tablet 11  ? sodium chloride HYPERTONIC 3 % nebulizer solution Take by nebulization as needed for other. 750 mL 12  ? vitamin B-12 (CYANOCOBALAMIN) 1000 MCG tablet Take 1,000 mcg by mouth daily.    ? vitamin C (ASCORBIC ACID) 500 MG tablet Take 500 mg by mouth daily.     ? ?Current Facility-Administered Medications on File Prior to Visit  ?Medication Dose Route Frequency Provider Last Rate Last Admin  ? nicotine (NICOTROL) 10 MG inhaler 1 continuous puffing  1 continuous puffing Inhalation PRN Rigoberto Noel, MD      ? regadenoson (LEXISCAN) injection SOLN 0.4 mg  0.4 mg Intravenous Once Fay Records, MD      ? technetium tetrofosmin (TC-MYOVIEW) injection 30 millicurie   30 millicurie Intravenous Once PRN Fay Records, MD      ? ?     ROS:  All others reviewed and negative. ? ?Objective  ? ?     PE:  BP 118/70   Pulse 71   Temp 98.1 ?F (36.7 ?C) (Oral)   Ht '5\' 1"'$  (1.549 m)   Wt 185 lb (83.9 kg)   SpO2 90%   BMI 34.96 kg/m?  ? ?              Constitutional: Pt appears in NAD ?              HENT: Head: NCAT.  ?  Right Ear: External ear normal.   ?              Left Ear: External ear normal.  ?              Eyes: . Pupils are equal, round, and reactive to light. Conjunctivae and EOM are normal ?              Nose: without d/c or deformity ?              Neck: Neck supple. Gross normal ROM ?              Cardiovascular: Normal rate and regular rhythm.   ?              Pulmonary/Chest: Effort normal and breath sounds without rales or wheezing.  ?              Abd:  Soft, NT, ND, + BS, no organomegaly ?              Neurological: Pt is alert. At baseline orientation, motor grossly intact ?              Skin: Skin is warm. No rashes, no other new lesions, LE edema - none ?              Psychiatric: Pt behavior is normal without agitation  ? ?Micro: none ? ?Cardiac tracings I have personally interpreted today:  none ? ?Pertinent Radiological findings (summarize): none  ? ?Lab Results  ?Component Value Date  ? WBC 7.8 04/13/2021  ? HGB 13.9 04/13/2021  ? HCT 41.9 04/13/2021  ? PLT 161.0 04/13/2021  ? GLUCOSE 90 04/13/2021  ? CHOL 156 04/13/2021  ? TRIG 255.0 (H) 04/13/2021  ? HDL 45.70 04/13/2021  ? LDLDIRECT 67.0 04/13/2021  ? [place] 68 02/02/2014  ? ALT 25 04/13/2021  ? AST 16 04/13/2021  ? NA 142 04/13/2021  ? K 3.6 04/13/2021  ? CL 95 (L) 04/13/2021  ? CREATININE 0.42 04/13/2021  ? BUN 11 04/13/2021  ? CO2 40 (H) 04/13/2021  ? TSH 2.81 04/13/2021  ? HGBA1C 6.3 04/13/2021  ? MICROALBUR 3.4 (H) 04/13/2021  ? ?Assessment/Plan:  ?[name] is a 66 y.o. Other or two or more races [6] female with  has a past medical history of Bronchitis, CAD (coronary artery  disease), CHF (congestive heart failure) ([place]), COPD (chronic obstructive pulmonary disease) ([place]), Diabetes ([place]), Headache, Heart disease, Hyperlipidemia, Hypertension, Impaired glucose tolerance (08/20/2014), and Si

## 2022-02-11 NOTE — Assessment & Plan Note (Addendum)
Last vitamin D ?Lab Results  ?Component Value Date  ? VD25OH 23.31 (L) 04/13/2021  ? ?Low, to increase oral replacement to 2000 u qd ? ?

## 2022-02-11 NOTE — Patient Instructions (Addendum)
Please see Office manager about the Patient Assistance program paperwork if you like ? ?Please consider change trulicity to ozempic if covered better ? ?Please take OTC Vitamin D3 at 2000 units per day, indefinitely ? ?Please continue all other medications as before, and refills have been done if requested. ? ?Please have the pharmacy call with any other refills you may need. ? ?Please continue your efforts at being more active, low cholesterol diet, and weight control. ? ?You are otherwise up to date with prevention measures today. ? ?Please keep your appointments with your specialists as you may have planned ? ?Please go to the LAB at the blood drawing area for the tests to be done ? ?You will be contacted by phone if any changes need to be made immediately.  Otherwise, you will receive a letter about your results with an explanation, but please check with MyChart first. ? ?Please remember to sign up for MyChart if you have not done so, as this will be important to you in the future with finding out test results, communicating by private email, and scheduling acute appointments online when needed. ? ?Please make an Appointment to return in 6 months, or sooner if needed - ok to CANCEL the May 2023 appt If you like since you are here today ?

## 2022-02-12 ENCOUNTER — Telehealth (HOSPITAL_COMMUNITY): Payer: Self-pay

## 2022-02-12 DIAGNOSIS — J449 Chronic obstructive pulmonary disease, unspecified: Secondary | ICD-10-CM | POA: Diagnosis not present

## 2022-02-12 NOTE — Telephone Encounter (Signed)
No response from pt regarding CR.  Closed referral.  

## 2022-02-14 NOTE — Assessment & Plan Note (Signed)
Pt counseled to quit, pt not ready 

## 2022-02-14 NOTE — Assessment & Plan Note (Signed)
Lab Results  ?Component Value Date  ? HGBA1C 6.3 04/13/2021  ? ?Stable, pt to continue current medical treatment trulicity, metformin, though consider change to ozempic for wt loss if ok insurance ? ?

## 2022-02-14 NOTE — Assessment & Plan Note (Signed)
Age and sex appropriate education and counseling updated with regular exercise and diet ?Referrals for preventative services - scheduled for eye exam next month ?Immunizations addressed - declines covid booster, pneumovax, shingrix, tdap ?Smoking counseling  - pt counsled to quit, pt not ready ?Evidence for depression or other mood disorder - chronic anxiety depression, declines need for change in tx ?Most recent labs reviewed. ?I have personally reviewed and have noted: ?1) the patient's medical and social history ?2) The patient's current medications and supplements ?3) The patient's height, weight, and BMI have been recorded in the chart ? ?

## 2022-02-14 NOTE — Assessment & Plan Note (Signed)
C/w recurring migraine, ok cont otc med p rn, consider ubrelvy or similar ?

## 2022-02-21 ENCOUNTER — Telehealth: Payer: Self-pay | Admitting: Pulmonary Disease

## 2022-02-21 MED ORDER — VARENICLINE TARTRATE 1 MG PO TABS: 1.0000 mg | ORAL_TABLET | Freq: Two times a day (BID) | ORAL | 0 refills | Status: DC

## 2022-02-21 MED ORDER — CHANTIX STARTING MONTH PAK 0.5 MG X 11 & 1 MG X 42 PO TBPK: ORAL_TABLET | ORAL | 0 refills | Status: DC

## 2022-02-21 NOTE — Telephone Encounter (Signed)
Spoke to patient, she is requesting a Rx for Chantix to help with smoking. Currently smoking 4-5 cigarettes daily.  ?She also reports of wheezing, increased SOB, weakness and headache. Sx have been present since last OV. Prescribed prednisone at that OV with no relief in sx. She feels that her sx would improved if she can quit smoking.  ?Using albuterol solution BID and trelegy once daily. ? ?Dr. Elsworth Soho, please advise. Thanks  ?

## 2022-02-21 NOTE — Telephone Encounter (Signed)
Patient is aware of below message and voiced her understanding.  ?Chantix sent to preferred pharmacy.  ?Nothing further needed.  ? ?

## 2022-02-27 ENCOUNTER — Other Ambulatory Visit: Payer: Self-pay | Admitting: Pulmonary Disease

## 2022-03-01 DIAGNOSIS — H0102B Squamous blepharitis left eye, upper and lower eyelids: Secondary | ICD-10-CM | POA: Diagnosis not present

## 2022-03-01 DIAGNOSIS — H40013 Open angle with borderline findings, low risk, bilateral: Secondary | ICD-10-CM | POA: Diagnosis not present

## 2022-03-01 DIAGNOSIS — H35371 Puckering of macula, right eye: Secondary | ICD-10-CM | POA: Diagnosis not present

## 2022-03-01 DIAGNOSIS — H0288A Meibomian gland dysfunction right eye, upper and lower eyelids: Secondary | ICD-10-CM | POA: Diagnosis not present

## 2022-03-01 DIAGNOSIS — H0288B Meibomian gland dysfunction left eye, upper and lower eyelids: Secondary | ICD-10-CM | POA: Diagnosis not present

## 2022-03-01 DIAGNOSIS — E119 Type 2 diabetes mellitus without complications: Secondary | ICD-10-CM | POA: Diagnosis not present

## 2022-03-01 DIAGNOSIS — H0102A Squamous blepharitis right eye, upper and lower eyelids: Secondary | ICD-10-CM | POA: Diagnosis not present

## 2022-03-01 DIAGNOSIS — Z961 Presence of intraocular lens: Secondary | ICD-10-CM | POA: Diagnosis not present

## 2022-03-01 DIAGNOSIS — H04123 Dry eye syndrome of bilateral lacrimal glands: Secondary | ICD-10-CM | POA: Diagnosis not present

## 2022-03-01 DIAGNOSIS — H26493 Other secondary cataract, bilateral: Secondary | ICD-10-CM | POA: Diagnosis not present

## 2022-03-01 LAB — HM DIABETES EYE EXAM

## 2022-03-07 ENCOUNTER — Other Ambulatory Visit (INDEPENDENT_AMBULATORY_CARE_PROVIDER_SITE_OTHER): Payer: Medicare HMO

## 2022-03-07 DIAGNOSIS — E559 Vitamin D deficiency, unspecified: Secondary | ICD-10-CM | POA: Diagnosis not present

## 2022-03-07 DIAGNOSIS — E538 Deficiency of other specified B group vitamins: Secondary | ICD-10-CM | POA: Diagnosis not present

## 2022-03-07 DIAGNOSIS — E1165 Type 2 diabetes mellitus with hyperglycemia: Secondary | ICD-10-CM | POA: Diagnosis not present

## 2022-03-07 LAB — LIPID PANEL
Cholesterol: 141 mg/dL (ref 0–200)
HDL: 46.8 mg/dL (ref >39.00)
LDL Cholesterol: 66 mg/dL (ref 0–99)
NonHDL: 94.48
Total CHOL/HDL Ratio: 3
Triglycerides: 140 mg/dL (ref 0.0–149.0)
VLDL: 28 mg/dL (ref 0.0–40.0)

## 2022-03-07 LAB — CBC WITH DIFFERENTIAL/PLATELET
Basophils Absolute: 0 10*3/uL (ref 0.0–0.1)
Basophils Relative: 0.4 % (ref 0.0–3.0)
Eosinophils Absolute: 0.1 10*3/uL (ref 0.0–0.7)
Eosinophils Relative: 1.5 % (ref 0.0–5.0)
HCT: 40.7 % (ref 36.0–46.0)
Hemoglobin: 13 g/dL (ref 12.0–15.0)
Lymphocytes Relative: 37.5 % (ref 12.0–46.0)
Lymphs Abs: 2.5 10*3/uL (ref 0.7–4.0)
MCHC: 32 g/dL (ref 30.0–36.0)
MCV: 87.6 fl (ref 78.0–100.0)
Monocytes Absolute: 0.5 10*3/uL (ref 0.1–1.0)
Monocytes Relative: 8.1 % (ref 3.0–12.0)
Neutro Abs: 3.4 10*3/uL (ref 1.4–7.7)
Neutrophils Relative %: 52.5 % (ref 43.0–77.0)
Platelets: 182 10*3/uL (ref 150.0–400.0)
RBC: 4.65 Mil/uL (ref 3.87–5.11)
RDW: 13.8 % (ref 11.5–15.5)
WBC: 6.6 10*3/uL (ref 4.0–10.5)

## 2022-03-07 LAB — HEPATIC FUNCTION PANEL
ALT: 25 U/L (ref 0–35)
AST: 21 U/L (ref 0–37)
Albumin: 4.1 g/dL (ref 3.5–5.2)
Alkaline Phosphatase: 68 U/L (ref 39–117)
Bilirubin, Direct: 0.1 mg/dL (ref 0.0–0.3)
Total Bilirubin: 0.4 mg/dL (ref 0.2–1.2)
Total Protein: 6.6 g/dL (ref 6.0–8.3)

## 2022-03-07 LAB — URINALYSIS, ROUTINE W REFLEX MICROSCOPIC
Hgb urine dipstick: NEGATIVE
Ketones, ur: NEGATIVE
Leukocytes,Ua: NEGATIVE
Nitrite: NEGATIVE
Specific Gravity, Urine: >=1.03 — AB (ref 1.000–1.030)
Urine Glucose: NEGATIVE
Urobilinogen, UA: 0.2 (ref 0.0–1.0)
pH: 5.5 (ref 5.0–8.0)

## 2022-03-07 LAB — BASIC METABOLIC PANEL
BUN: 10 mg/dL (ref 6–23)
CO2: 40 mEq/L — ABNORMAL HIGH (ref 19–32)
Calcium: 9.5 mg/dL (ref 8.4–10.5)
Chloride: 99 mEq/L (ref 96–112)
Creatinine, Ser: 0.47 mg/dL (ref 0.40–1.20)
GFR: 99.3 mL/min (ref >60.00)
Glucose, Bld: 92 mg/dL (ref 70–99)
Potassium: 3.7 mEq/L (ref 3.5–5.1)
Sodium: 144 mEq/L (ref 135–145)

## 2022-03-07 LAB — MICROALBUMIN / CREATININE URINE RATIO
Creatinine,U: 225.4 mg/dL
Microalb Creat Ratio: 3.9 mg/g (ref 0.0–30.0)
Microalb, Ur: 8.7 mg/dL — ABNORMAL HIGH (ref 0.0–1.9)

## 2022-03-07 LAB — HEMOGLOBIN A1C: Hgb A1c MFr Bld: 6.6 % — ABNORMAL HIGH (ref 4.6–6.5)

## 2022-03-07 LAB — VITAMIN B12: Vitamin B-12: 796 pg/mL (ref 211–911)

## 2022-03-07 LAB — VITAMIN D 25 HYDROXY (VIT D DEFICIENCY, FRACTURES): VITD: 21.22 ng/mL — ABNORMAL LOW (ref 30.00–100.00)

## 2022-03-07 LAB — TSH: TSH: 4.09 u[IU]/mL (ref 0.35–5.50)

## 2022-03-14 ENCOUNTER — Encounter: Payer: Self-pay | Admitting: Internal Medicine

## 2022-03-14 ENCOUNTER — Ambulatory Visit (INDEPENDENT_AMBULATORY_CARE_PROVIDER_SITE_OTHER): Payer: Medicare HMO | Admitting: Internal Medicine

## 2022-03-14 VITALS — BP 124/72 | HR 75 | Temp 98.7°F | Ht 61.0 in | Wt 182.0 lb

## 2022-03-14 DIAGNOSIS — E559 Vitamin D deficiency, unspecified: Secondary | ICD-10-CM | POA: Diagnosis not present

## 2022-03-14 DIAGNOSIS — I10 Essential (primary) hypertension: Secondary | ICD-10-CM | POA: Diagnosis not present

## 2022-03-14 DIAGNOSIS — J449 Chronic obstructive pulmonary disease, unspecified: Secondary | ICD-10-CM | POA: Diagnosis not present

## 2022-03-14 DIAGNOSIS — K5900 Constipation, unspecified: Secondary | ICD-10-CM

## 2022-03-14 DIAGNOSIS — M5441 Lumbago with sciatica, right side: Secondary | ICD-10-CM

## 2022-03-14 DIAGNOSIS — M545 Low back pain, unspecified: Secondary | ICD-10-CM | POA: Insufficient documentation

## 2022-03-14 MED ORDER — PREDNISONE 10 MG PO TABS: ORAL_TABLET | ORAL | 0 refills | Status: DC

## 2022-03-14 MED ORDER — MELOXICAM 15 MG PO TABS: 15.0000 mg | ORAL_TABLET | Freq: Every day | ORAL | 1 refills | Status: DC | PRN

## 2022-03-14 MED ORDER — CYCLOBENZAPRINE HCL 10 MG PO TABS: 10.0000 mg | ORAL_TABLET | Freq: Three times a day (TID) | ORAL | 2 refills | Status: DC | PRN

## 2022-03-14 MED ORDER — DOCUSATE SODIUM 100 MG PO CAPS: 100.0000 mg | ORAL_CAPSULE | Freq: Two times a day (BID) | ORAL | 5 refills | Status: DC

## 2022-03-14 NOTE — Progress Notes (Signed)
Patient ID: [name], female   DOB: August 21, 1956, 66 y.o.   MRN: [id number]        Chief Complaint: follow up low vit d, right low back pain, constipation, htn       HPI:  [name] is a 66 y.o. female here with mention of recent straining at stool with hard stools despite the miralax use daily, now with acute onset 1 wk right lower back pain, dull, moderate, constant but worse to stand up or bend, and even occasionally numb discomfort to distal right leg as well.  chest pain   Pt denies polydipsia, polyuria, or new focal neuro s/s.    Pt denies fever, wt loss, night sweats, loss of appetite, or other constitutional symptoms         Wt Readings from Last 3 Encounters:  03/14/22 182 lb (82.6 kg)  02/11/22 185 lb (83.9 kg)  01/02/22 180 lb (81.6 kg)   BP Readings from Last 3 Encounters:  03/14/22 124/72  02/11/22 118/70  01/02/22 114/62         Past Medical History:  Diagnosis Date   Bronchitis    CAD (coronary artery disease)    a. 02/2002: CABG x3 with LIMA to LAD, RIMA to RCA, and SVG to OM   CHF (congestive heart failure) ([place])    COPD (chronic obstructive pulmonary disease) ([place])    Diabetes ([place])    Headache    Heart disease    Hyperlipidemia    Hypertension    Impaired glucose tolerance 08/20/2014   Sinusitis    Past Surgical History:  Procedure Laterality Date   CORONARY ARTERY BYPASS GRAFT  2003   Triple bypass   ESOPHAGOGASTRODUODENOSCOPY Left 06/13/2014   Procedure: ESOPHAGOGASTRODUODENOSCOPY (EGD);  Surgeon: Juanita Craver, MD;  Location: [facility] ENDOSCOPY;  Service: Endoscopy;  Laterality: Left;   RIGID ESOPHAGOSCOPY N/A 06/12/2014   Procedure: RIGID ESOPHAGOSCOPY WITH FOREIGN BODY REMOVAL;  Surgeon: Ascencion Dike, MD;  Location: [place];  Service: ENT;  Laterality: N/A;    reports that she has been smoking cigarettes. She started smoking about 50 years ago. She has a 23.50 pack-year smoking history. She has never used smokeless tobacco. She reports current alcohol use  of about 5.0 - 6.0 standard drinks per week. She reports that she does not currently use drugs after having used the following drugs: Marijuana. family history includes Brain cancer in her mother; Cancer in her maternal grandmother and another family member; Heart disease in her mother; Lung cancer in her maternal aunt and paternal uncle; Stroke in an other family member; Suicidality in her brother. Allergies  Allergen Reactions   Augmentin [Amoxicillin-Pot Clavulanate] Nausea And Vomiting   Current Outpatient Medications on File Prior to Visit  Medication Sig Dispense Refill   albuterol (VENTOLIN HFA) 108 (90 Base) MCG/ACT inhaler INHALE 1-2 PUFFS BY MOUTH EVERY 6 HOURS AS NEEDED FOR WHEEZE OR SHORTNESS OF BREATH 18 g 5   amLODipine (NORVASC) 5 MG tablet Take 1 tablet (5 mg total) by mouth daily. Patient needs appointment for # 180. Please call office at [phone] to schedule appointment. 30 tablet 0   atorvastatin (LIPITOR) 40 MG tablet TAKE 1 TABLET BY MOUTH EVERY DAY 90 tablet 1   carvedilol (COREG) 12.5 MG tablet Take 1 tablet (12.5 mg total) by mouth 2 (two) times daily. Patient needs appointment for # 180. Please call office at [phone] to schedule appointment. 60 tablet 0   cholecalciferol (VITAMIN D) 1000 units tablet Take  1,000 Units by mouth daily.     Dulaglutide (TRULICITY) 1.06 YI/9.4WN SOPN Inject 0.75 mg into the skin once a week. 6 mL 0   Fluticasone-Umeclidin-Vilant (TRELEGY ELLIPTA) 200-62.5-25 MCG/INH AEPB Inhale 1 puff into the lungs daily. 1 each 11   furosemide (LASIX) 40 MG tablet TAKE 1 TABLET BY MOUTH TWICE A DAY 180 tablet 1   ipratropium (ATROVENT) 0.02 % nebulizer solution Please specify directions, refills and quantity 100 mL 5   isosorbide mononitrate (IMDUR) 60 MG 24 hr tablet Take 1.5 tablets (90 mg total) by mouth daily. 135 tablet 1   Melatonin 10 MG SUBL Place 1 each under the tongue as needed.     metFORMIN (GLUCOPHAGE-XR) 500 MG 24 hr tablet TAKE 1  TABLET BY MOUTH EVERY DAY WITH BREAKFAST 90 tablet 1   Multiple Vitamin (MULTIVITAMIN WITH MINERALS) TABS tablet Take 1 tablet by mouth daily.     nicotine (NICOTROL) 10 MG inhaler INHALE 1 CARTRIDGE (1 CONTINUOUS PUFFING TOTAL) INTO THE LUNGS AS NEEDED FOR SMOKING CESSATION. 168 each 0   potassium chloride (KLOR-CON M10) 10 MEQ tablet TAKE 1 TABLET BY MOUTH EVERY DAY 90 tablet 1   predniSONE (DELTASONE) 10 MG tablet Take 1 tablet (10 mg total) by mouth daily with breakfast. 30 tablet 0   predniSONE (DELTASONE) 5 MG tablet Take 1 tablet (5 mg total) by mouth daily with breakfast. 30 tablet 2   Roflumilast (DALIRESP) 250 MCG TABS Take 1 tablet by mouth daily. 30 tablet 11   sodium chloride HYPERTONIC 3 % nebulizer solution Take by nebulization as needed for other. 750 mL 12   varenicline (CHANTIX CONTINUING MONTH PAK) 1 MG tablet Take 1 tablet (1 mg total) by mouth 2 (two) times daily. 60 tablet 0   Varenicline Tartrate, Starter, (CHANTIX STARTING MONTH PAK) 0.5 MG X 11 & 1 MG X 42 TBPK Use as directed 53 each 0   vitamin B-12 (CYANOCOBALAMIN) 1000 MCG tablet Take 1,000 mcg by mouth daily.     vitamin C (ASCORBIC ACID) 500 MG tablet Take 500 mg by mouth daily.      Current Facility-Administered Medications on File Prior to Visit  Medication Dose Route Frequency Provider Last Rate Last Admin   nicotine (NICOTROL) 10 MG inhaler 1 continuous puffing  1 continuous puffing Inhalation PRN Rigoberto Noel, MD       regadenoson (LEXISCAN) injection SOLN 0.4 mg  0.4 mg Intravenous Once Fay Records, MD       technetium tetrofosmin (TC-MYOVIEW) injection 30 millicurie  30 millicurie Intravenous Once PRN Fay Records, MD            ROS:  All others reviewed and negative.  Objective        PE:  BP 124/72 (BP Location: Right Arm, Patient Position: Sitting, Cuff Size: Large)   Pulse 75   Temp 98.7 F (37.1 C) (Oral)   Ht '5\' 1"'$  (1.549 m)   Wt 182 lb (82.6 kg)   SpO2 96%   BMI 34.39 kg/m                  Constitutional: Pt appears in NAD               HENT: Head: NCAT.                Right Ear: External ear normal.                 Left Ear: External ear normal.  Eyes: . Pupils are equal, round, and reactive to light. Conjunctivae and EOM are normal               Nose: without d/c or deformity               Neck: Neck supple. Gross normal ROM               Cardiovascular: Normal rate and regular rhythm.                 Pulmonary/Chest: Effort normal and breath sounds without rales or wheezing.                Abd:  Soft, NT, ND, + BS,                Spine non tender In midline, no sweling or rash, but has mild to mod right lumbar paravertebral tender spasm               Neurological: Pt is alert. At baseline orientation, motor grossly intact               Skin: Skin is warm. No rashes, no other new lesions, LE edema - none               Psychiatric: Pt behavior is normal without agitation   Micro: none  Cardiac tracings I have personally interpreted today:  none  Pertinent Radiological findings (summarize): none   Lab Results  Component Value Date   WBC 6.6 03/07/2022   HGB 13.0 03/07/2022   HCT 40.7 03/07/2022   PLT 182.0 03/07/2022   GLUCOSE 92 03/07/2022   CHOL 141 03/07/2022   TRIG 140.0 03/07/2022   HDL 46.80 03/07/2022   LDLDIRECT 67.0 04/13/2021   LDLCALC 66 03/07/2022   ALT 25 03/07/2022   AST 21 03/07/2022   NA 144 03/07/2022   K 3.7 03/07/2022   CL 99 03/07/2022   CREATININE 0.47 03/07/2022   BUN 10 03/07/2022   CO2 40 (H) 03/07/2022   TSH 4.09 03/07/2022   HGBA1C 6.6 (H) 03/07/2022   MICROALBUR 8.7 (H) 03/07/2022   Assessment/Plan:  [name] is a 66 y.o. Other or two or more races [6] female with  has a past medical history of Bronchitis, CAD (coronary artery disease), CHF (congestive heart failure) ([place]), COPD (chronic obstructive pulmonary disease) ([place]), Diabetes ([place]), Headache, Heart disease, Hyperlipidemia, Hypertension, Impaired  glucose tolerance (08/20/2014), and Sinusitis.  Vitamin D deficiency Last vitamin D Lab Results  Component Value Date   VD25OH 21.22 (L) 03/07/2022   Low, to start oral replacement   Low back pain C/w msk strain but also with mild intermittent right sciatica - for mobic prn, prednisone short course, and flexeril prn, consider sport med if not improved  Constipation Also to add colace 100 bid prn  Hypertensive disorder BP Readings from Last 3 Encounters:  03/14/22 124/72  02/11/22 118/70  01/02/22 114/62   Stable, pt to continue medical treatment norvasc, coreg  Followup: Return in about 6 months (around 09/14/2022).  Cathlean Cower, MD 03/14/2022 2:27 PM [facility]

## 2022-03-14 NOTE — Assessment & Plan Note (Signed)
C/w msk strain but also with mild intermittent right sciatica - for mobic prn, prednisone short course, and flexeril prn, consider sport med if not improved

## 2022-03-14 NOTE — Patient Instructions (Addendum)
Please take OTC Vitamin D3 at 2000 units per day, indefinitely  Please take all new medication as prescribed - the meloxicam for pain, muscle relaxer as needed, and the extra prednisone  If not better in a few weeks , you may need MRI for the lower back  Ok to take the OTC Colace 100 mg twice per day for stool softner as well  Please continue all other medications as before, and refills have been done if requested.  Please have the pharmacy call with any other refills you may need.  Please continue your efforts at being more active, low cholesterol diet, and weight control.  You are otherwise up to date with prevention measures today.  Please keep your appointments with your specialists as you may have planned  Please make an Appointment to return in 6 months, or sooner if needed

## 2022-03-14 NOTE — Assessment & Plan Note (Signed)
Also to add colace 100 bid prn

## 2022-03-14 NOTE — Assessment & Plan Note (Signed)
Last vitamin D Lab Results  Component Value Date   VD25OH 21.22 (L) 03/07/2022   Low, to start oral replacement

## 2022-03-14 NOTE — Assessment & Plan Note (Signed)
BP Readings from Last 3 Encounters:  03/14/22 124/72  02/11/22 118/70  01/02/22 114/62   Stable, pt to continue medical treatment norvasc, coreg

## 2022-03-17 ENCOUNTER — Other Ambulatory Visit: Payer: Self-pay | Admitting: Pulmonary Disease

## 2022-03-19 NOTE — Telephone Encounter (Signed)
Beth, please advise if you are okay with us refilling med. 

## 2022-03-20 DIAGNOSIS — H26491 Other secondary cataract, right eye: Secondary | ICD-10-CM | POA: Diagnosis not present

## 2022-03-20 NOTE — Telephone Encounter (Signed)
I haven't seen since October, please send refill to Dr. Elsworth Soho to refill

## 2022-03-29 ENCOUNTER — Other Ambulatory Visit: Payer: Self-pay | Admitting: Adult Health

## 2022-04-01 ENCOUNTER — Other Ambulatory Visit: Payer: Self-pay | Admitting: Internal Medicine

## 2022-04-01 DIAGNOSIS — I25119 Atherosclerotic heart disease of native coronary artery with unspecified angina pectoris: Secondary | ICD-10-CM

## 2022-04-01 NOTE — Telephone Encounter (Signed)
Please refill as per office routine med refill policy (all routine meds to be refilled for 3 mo or monthly (per pt preference) up to one year from last visit, then month to month grace period for 3 mo, then further med refills will have to be denied) ? ?

## 2022-04-02 ENCOUNTER — Other Ambulatory Visit: Payer: Self-pay | Admitting: Internal Medicine

## 2022-04-02 DIAGNOSIS — Z1231 Encounter for screening mammogram for malignant neoplasm of breast: Secondary | ICD-10-CM

## 2022-04-04 ENCOUNTER — Other Ambulatory Visit: Payer: Self-pay | Admitting: Pulmonary Disease

## 2022-04-10 ENCOUNTER — Ambulatory Visit (INDEPENDENT_AMBULATORY_CARE_PROVIDER_SITE_OTHER): Payer: Medicare HMO | Admitting: Primary Care

## 2022-04-10 ENCOUNTER — Encounter: Payer: Self-pay | Admitting: Primary Care

## 2022-04-10 ENCOUNTER — Telehealth (HOSPITAL_COMMUNITY): Payer: Self-pay | Admitting: Internal Medicine

## 2022-04-10 DIAGNOSIS — J4489 Other specified chronic obstructive pulmonary disease: Secondary | ICD-10-CM

## 2022-04-10 DIAGNOSIS — K5900 Constipation, unspecified: Secondary | ICD-10-CM

## 2022-04-10 DIAGNOSIS — J449 Chronic obstructive pulmonary disease, unspecified: Secondary | ICD-10-CM

## 2022-04-10 DIAGNOSIS — J9611 Chronic respiratory failure with hypoxia: Secondary | ICD-10-CM

## 2022-04-10 MED ORDER — TRELEGY ELLIPTA 200-62.5-25 MCG/ACT IN AEPB: 1.0000 | INHALATION_SPRAY | Freq: Every day | RESPIRATORY_TRACT | 4 refills | Status: DC

## 2022-04-10 MED ORDER — DOCUSATE SODIUM 100 MG PO CAPS: 100.0000 mg | ORAL_CAPSULE | Freq: Two times a day (BID) | ORAL | 5 refills | Status: DC

## 2022-04-10 MED ORDER — ROFLUMILAST 250 MCG PO TABS
1.0000 | ORAL_TABLET | Freq: Every day | ORAL | 11 refills | Status: DC
Start: 2022-04-10 — End: 2022-10-25

## 2022-04-10 NOTE — Assessment & Plan Note (Addendum)
-   Overall stable; She has hx recurrent exacerbations in 2022 requiring maintenance prednisone in Sept 2022. No recent hospitalizations. Unable to taper to lower dose prednisone. Continues on '10mg'$  daily. Appears she never started Daliresp, we have sent prescription. Continue Trelegy 259mg one puff daily. If above not working, may need noninvasive ventilator and would will need ABGs.

## 2022-04-10 NOTE — Progress Notes (Signed)
$'@Patient'D$  ID: [name], female    DOB: Jan 04, 1956, 66 y.o.   MRN: [id number]  Chief Complaint  Patient presents with   Follow-up    Pt is here for follow up for COPD. Pt states for the last week she has ben winded. She is unsure of the cause. Pt is on Trelegy daily and Albuterol as needed. And prednisone '10mg'$  daily. Pt want s to discuss alternative to prednisone.     Referring provider: Biagio Borg, MD  HPI:66 yo smoker for FU of COPD, quit 11/2017. PMH significant for mild tracheomalacia with chronic hypoxic resp failure on o2 since 2018. Patient of Dr. Elsworth Soho.   Previous [facility] pulmonary encounter: 01/02/22- Dr. Elsworth Soho  Recurrent exacerbations in 2022, requiring maintenance prednisone starting 06/2021.  She was able to taper down to 10 mg alternating with 5 mg but in February had a flareup and required 20 mg for a week. She is now back on the alternating dose.  She wants to discuss side effects of steroids. She feels like she has phlegm but cannot bring this up. She is compliant with Trelegy use albuterol nebs twice daily. She is compliant with Lasix  Chest x-ray 07/2021 shows hyperinflation and chronic bronchitis   04/10/2022- Interim hx  Patient presents today for 55-monthfollow-up.  She was last seen in March by Dr. AElsworth Soho  During that visit it was recommended she decrease prednisone to 5 mg daily and given Rx for Daliresp 250 mcg daily hypertonic saline nebs.  Referred to pulmonary rehab.  She is currently on '10mg'$  prednisone. She was unable to taper to lower dose. She reports side effects from steriods including polyphagia and constipation. No recent hospitalizations. She is not taking daliresp. Remains on Trelegy 200. She has not need rescue inhaler in the last few days. She is unable to participate in pulmonary rehab d/t copay cost. She will consider calling them to set up. No issues sleeping. She is wearing 3l oxygen 24/7.    Significant tests/ events reviewed CT angiogram chest  01/2020-emphysema, mild tracheomalacia   04/2019-pulmonary function test- FVC 1.05 (44% predicted), postbronchodilator ratio 47, postbronchodilator FEV1 0.47 (25% predicted), DLCO 54   Spirometry 02/2017 >> ratio of 59, FEV1 of 42% FVC of 56% ABG 01/2014 was 7.29/66/75/92%   08/2014 >> PSG neg for OSA , Desaturation as low as 85%  Allergies  Allergen Reactions   Augmentin [Amoxicillin-Pot Clavulanate] Nausea And Vomiting    Immunization History  Administered Date(s) Administered   Influenza Split 07/08/2013   Influenza,inj,Quad PF,6+ Mos 07/31/2015, 09/03/2017, 10/04/2021   PFIZER Comirnaty(Gray Top)Covid-19 Tri-Sucrose Vaccine 08/31/2020   PFIZER(Purple Top)SARS-COV-2 Vaccination 01/01/2020, 01/25/2020   Pneumococcal Polysaccharide-23 02/02/2014, 05/03/2019    Past Medical History:  Diagnosis Date   Bronchitis    CAD (coronary artery disease)    a. 02/2002: CABG x3 with LIMA to LAD, RIMA to RCA, and SVG to OM   CHF (congestive heart failure) (HCC)    COPD (chronic obstructive pulmonary disease) ([facility]    Diabetes ([facility]    Headache    Heart disease    Hyperlipidemia    Hypertension    Impaired glucose tolerance 08/20/2014   Sinusitis     Tobacco History: Social History   Tobacco Use  Smoking Status Some Days   Packs/day: 0.50   Years: 47.00   Total pack years: 23.50   Types: Cigarettes   Start date: 12/29/1971   Last attempt to quit: 05/22/2019   Years since quitting: 2.8  Smokeless Tobacco Never  Tobacco Comments   Once she starts feeling better she begins the cycle of smoking again, until she feels bad again       2-3 per day 08/13/2021   Ready to quit: Not Answered Counseling given: Not Answered Tobacco comments: Once she starts feeling better she begins the cycle of smoking again, until she feels bad again   2-3 per day 08/13/2021   Outpatient Medications Prior to Visit  Medication Sig Dispense Refill   albuterol (VENTOLIN HFA) 108 (90 Base) MCG/ACT  inhaler INHALE 1-2 PUFFS BY MOUTH EVERY 6 HOURS AS NEEDED FOR WHEEZE OR SHORTNESS OF BREATH 18 g 5   amLODipine (NORVASC) 5 MG tablet Take 1 tablet (5 mg total) by mouth daily. Patient needs appointment for # 180. Please call office at [phone] to schedule appointment. 30 tablet 0   atorvastatin (LIPITOR) 40 MG tablet TAKE 1 TABLET BY MOUTH EVERY DAY 90 tablet 1   carvedilol (COREG) 12.5 MG tablet Take 1 tablet (12.5 mg total) by mouth 2 (two) times daily. Patient needs appointment for # 180. Please call office at [phone] to schedule appointment. 60 tablet 0   cholecalciferol (VITAMIN D) 1000 units tablet Take 1,000 Units by mouth daily.     cyclobenzaprine (FLEXERIL) 10 MG tablet Take 1 tablet (10 mg total) by mouth 3 (three) times daily as needed for muscle spasms. 40 tablet 2   Dulaglutide (TRULICITY) 7.25 DG/6.4QI SOPN Inject 0.75 mg into the skin once a week. 6 mL 0   furosemide (LASIX) 40 MG tablet TAKE 1 TABLET BY MOUTH TWICE A DAY 180 tablet 1   isosorbide mononitrate (IMDUR) 60 MG 24 hr tablet Take 1.5 tablets (90 mg total) by mouth daily. 135 tablet 1   Melatonin 10 MG SUBL Place 1 each under the tongue as needed.     meloxicam (MOBIC) 15 MG tablet Take 1 tablet (15 mg total) by mouth daily as needed for pain. 90 tablet 1   metFORMIN (GLUCOPHAGE-XR) 500 MG 24 hr tablet TAKE 1 TABLET BY MOUTH EVERY DAY WITH BREAKFAST 90 tablet 1   Multiple Vitamin (MULTIVITAMIN WITH MINERALS) TABS tablet Take 1 tablet by mouth daily.     predniSONE (DELTASONE) 5 MG tablet Take 1 tablet (5 mg total) by mouth daily with breakfast. 30 tablet 2   sodium chloride HYPERTONIC 3 % nebulizer solution Take by nebulization as needed for other. 750 mL 12   vitamin B-12 (CYANOCOBALAMIN) 1000 MCG tablet Take 1,000 mcg by mouth daily.     vitamin C (ASCORBIC ACID) 500 MG tablet Take 500 mg by mouth daily.      Fluticasone-Umeclidin-Vilant (TRELEGY ELLIPTA) 200-62.5-25 MCG/ACT AEPB TAKE 1 PUFF BY MOUTH EVERY DAY  60 each 0   Roflumilast (DALIRESP) 250 MCG TABS Take 1 tablet by mouth daily. 30 tablet 11   ipratropium (ATROVENT) 0.02 % nebulizer solution Please specify directions, refills and quantity (Patient not taking: Reported on 04/10/2022) 100 mL 5   docusate sodium (COLACE) 100 MG capsule Take 1 capsule (100 mg total) by mouth 2 (two) times daily. 60 capsule 5   nicotine (NICOTROL) 10 MG inhaler INHALE 1 CARTRIDGE (1 CONTINUOUS PUFFING TOTAL) INTO THE LUNGS AS NEEDED FOR SMOKING CESSATION. 168 each 0   potassium chloride (KLOR-CON M10) 10 MEQ tablet TAKE 1 TABLET BY MOUTH EVERY DAY 90 tablet 1   predniSONE (DELTASONE) 10 MG tablet Take 1 tablet (10 mg total) by mouth daily with breakfast. 30 tablet 0  predniSONE (DELTASONE) 10 MG tablet 3 tabs by mouth per day for 3 days,2tabs per day for 3 days,1tab per day for 3 days 18 tablet 0   varenicline (CHANTIX) 1 MG tablet TAKE 1 TABLET BY MOUTH TWICE A DAY 180 tablet 0   Varenicline Tartrate, Starter, (CHANTIX STARTING MONTH PAK) 0.5 MG X 11 & 1 MG X 42 TBPK Use as directed 53 each 0   Facility-Administered Medications Prior to Visit  Medication Dose Route Frequency Provider Last Rate Last Admin   nicotine (NICOTROL) 10 MG inhaler 1 continuous puffing  1 continuous puffing Inhalation PRN Rigoberto Noel, MD       regadenoson (LEXISCAN) injection SOLN 0.4 mg  0.4 mg Intravenous Once Fay Records, MD       technetium tetrofosmin (TC-MYOVIEW) injection 30 millicurie  30 millicurie Intravenous Once PRN Fay Records, MD        Review of Systems  Review of Systems  Constitutional: Negative.   HENT: Negative.    Respiratory:  Positive for shortness of breath and wheezing. Negative for cough and chest tightness.   Cardiovascular: Negative.    Physical Exam  BP 122/62 (BP Location: Left Arm, Patient Position: Sitting, Cuff Size: Normal)   Pulse 86   Temp 98.2 F (36.8 C) (Oral)   Ht '5\' 1"'$  (1.549 m)   Wt 185 lb 3.2 oz (84 kg)   SpO2 93%   BMI 34.99  kg/m  Physical Exam Constitutional:      Appearance: Normal appearance.  HENT:     Head: Normocephalic and atraumatic.     Mouth/Throat:     Mouth: Mucous membranes are moist.     Pharynx: Oropharynx is clear.  Cardiovascular:     Rate and Rhythm: Normal rate and regular rhythm.     Comments: No edema Pulmonary:     Effort: Pulmonary effort is normal.     Breath sounds: Wheezing present.     Comments: Isolated wheezing right upper lobe. On 3L continuous O2  Musculoskeletal:        General: Normal range of motion.     Cervical back: Normal range of motion and neck supple.  Skin:    General: Skin is warm and dry.  Neurological:     General: No focal deficit present.     Mental Status: She is alert and oriented to person, place, and time. Mental status is at baseline.  Psychiatric:        Mood and Affect: Mood normal.        Behavior: Behavior normal.        Thought Content: Thought content normal.      Lab Results:  CBC    Component Value Date/Time   WBC 6.6 03/07/2022 1020   RBC 4.65 03/07/2022 1020   HGB 13.0 03/07/2022 1020   HGB 15.7 06/12/2014 1543   HCT 40.7 03/07/2022 1020   HCT 48.3 (H) 06/12/2014 1543   PLT 182.0 03/07/2022 1020   PLT 178 06/12/2014 1543   MCV 87.6 03/07/2022 1020   MCV 91 06/12/2014 1543   MCH 29.0 01/28/2020 0205   MCHC 32.0 03/07/2022 1020   RDW 13.8 03/07/2022 1020   RDW 14.8 (H) 06/12/2014 1543   LYMPHSABS 2.5 03/07/2022 1020   MONOABS 0.5 03/07/2022 1020   EOSABS 0.1 03/07/2022 1020   BASOSABS 0.0 03/07/2022 1020    BMET    Component Value Date/Time   NA 144 03/07/2022 1020   NA 142 06/12/2014 1543  K 3.7 03/07/2022 1020   K 3.6 06/12/2014 1543   CL 99 03/07/2022 1020   CL 105 06/12/2014 1543   CO2 40 (H) 03/07/2022 1020   CO2 29 06/12/2014 1543   GLUCOSE 92 03/07/2022 1020   GLUCOSE 113 (H) 06/12/2014 1543   BUN 10 03/07/2022 1020   BUN 12 06/12/2014 1543   CREATININE 0.47 03/07/2022 1020   CREATININE 0.62  06/12/2014 1543   CALCIUM 9.5 03/07/2022 1020   CALCIUM 8.9 06/12/2014 1543   GFRNONAA NOT CALCULATED 01/28/2020 0205   GFRNONAA >60 06/12/2014 1543   GFRAA NOT CALCULATED 01/28/2020 0205   GFRAA >60 06/12/2014 1543    BNP    Component Value Date/Time   BNP 61.4 01/28/2020 0826    ProBNP    Component Value Date/Time   PROBNP 48.0 03/21/2021 1253    Imaging: No results found.   Assessment & Plan:   COPD (chronic obstructive pulmonary disease) with chronic bronchitis (HCC) - Overall stable; She has hx recurrent exacerbations in 2022 requiring maintenance prednisone in Sept 2022. No recent hospitalizations. Unable to taper to lower dose prednisone. Continues on '10mg'$  daily. Appears she never started Daliresp, we have sent prescription. Continue Trelegy 23mg one puff daily. If above not working, may need noninvasive ventilator and would will need ABGs.   Chronic respiratory failure with hypoxia (HCC) - Continue 3L oxygen 24/7. We will provide patient with contact number for pulmonary, encourage if she can that she attend   Constipation - She is not taking anything daily except for prune juice every other day. - Sending in RX to start Docusate Sodium (Colace) 100 mg twice daily for constipation   EMartyn Ehrich NP 04/10/2022

## 2022-04-10 NOTE — Assessment & Plan Note (Signed)
-   Continue 3L oxygen 24/7. We will provide patient with contact number for pulmonary, encourage if she can that she attend

## 2022-04-10 NOTE — Assessment & Plan Note (Addendum)
-   She is not taking anything daily except for prune juice every other day. - Sending in RX to start Docusate Sodium (Colace) 100 mg twice daily for constipation

## 2022-04-10 NOTE — Patient Instructions (Addendum)
Recommendations: - Continue Trelegy 1 puff daily - Continue prednisone 10 mg daily - Make sure you are taking Roflumilast (Daliresp) 224mg daily  - Start Docusate Sodium (Colace) 100 mg twice daily for constipation - Continue 3 L supplemental oxygen - Call pulmonary rehab to see if you can start program / [phone] Follow-up: - 3 months with Dr. AElsworth Sohoor sooner if needed

## 2022-04-14 DIAGNOSIS — J449 Chronic obstructive pulmonary disease, unspecified: Secondary | ICD-10-CM | POA: Diagnosis not present

## 2022-04-17 ENCOUNTER — Other Ambulatory Visit: Payer: Self-pay | Admitting: Pulmonary Disease

## 2022-04-20 ENCOUNTER — Other Ambulatory Visit: Payer: Self-pay | Admitting: Cardiology

## 2022-04-25 ENCOUNTER — Ambulatory Visit: Payer: Medicare HMO

## 2022-04-26 ENCOUNTER — Telehealth: Payer: Self-pay | Admitting: Internal Medicine

## 2022-04-26 NOTE — Telephone Encounter (Signed)
PT called and stated she is sweating a lot, she has a headache, and she feels nauseous. Pt is at [facility] and would like a rx for her symptoms. She stated an rx was prescribed for her the last time she felt this way. She stated she does not know the name of the medication. Advised pt I needed a name and she said Dr. Jenny Reichmann would know the medication she wants and demanded that I just send the message requesting the rx.    Please advise   CB: [phone]

## 2022-04-26 NOTE — Telephone Encounter (Signed)
Advised patient that she would need to be seen before medication can be prescribed. Patient states that [place] the beach [place] and can wait until next week to be seen. Scheduled patient for 04/30/22 with Dr. Sharlet Salina

## 2022-04-30 ENCOUNTER — Ambulatory Visit: Payer: Medicare HMO | Admitting: Internal Medicine

## 2022-04-30 ENCOUNTER — Ambulatory Visit: Payer: Medicare HMO

## 2022-05-04 ENCOUNTER — Other Ambulatory Visit: Payer: Self-pay | Admitting: Cardiology

## 2022-05-07 ENCOUNTER — Other Ambulatory Visit: Payer: Self-pay | Admitting: Cardiology

## 2022-05-07 ENCOUNTER — Other Ambulatory Visit: Payer: Self-pay | Admitting: Internal Medicine

## 2022-05-14 ENCOUNTER — Other Ambulatory Visit: Payer: Self-pay | Admitting: Cardiology

## 2022-05-14 ENCOUNTER — Ambulatory Visit: Payer: Medicare HMO | Admitting: Internal Medicine

## 2022-05-14 DIAGNOSIS — J449 Chronic obstructive pulmonary disease, unspecified: Secondary | ICD-10-CM | POA: Diagnosis not present

## 2022-05-15 ENCOUNTER — Telehealth: Payer: Self-pay | Admitting: Cardiology

## 2022-05-15 MED ORDER — CARVEDILOL 12.5 MG PO TABS: 12.5000 mg | ORAL_TABLET | Freq: Two times a day (BID) | ORAL | 0 refills | Status: DC

## 2022-05-15 NOTE — Telephone Encounter (Signed)
Pt aware 30 day Rx will be sent to pharmacy to get her though until her follow up next week. Informed to request 90 day refills at that appt if she should continue taking the medication. Patient verbalized understanding and agreeable to plan.

## 2022-05-15 NOTE — Telephone Encounter (Signed)
*  STAT* If patient is at the pharmacy, call can be transferred to refill team.   1. Which medications need to be refilled? (please list name of each medication and dose if known) carvedilol (COREG) 12.5 MG tablet  2. Which pharmacy/location (including street and city if local pharmacy) is medication to be sent to? [id number]- [place], [place] - [address]  3. Do they need a 30 day or 90 day supply? 90  Has appt for  08/01 with Dr. SMarlou Porch

## 2022-05-21 ENCOUNTER — Encounter: Payer: Self-pay | Admitting: Cardiology

## 2022-05-21 ENCOUNTER — Ambulatory Visit: Payer: Medicare HMO | Admitting: Cardiology

## 2022-05-21 VITALS — BP 130/70 | HR 73 | Ht 61.0 in | Wt 186.0 lb

## 2022-05-21 DIAGNOSIS — I25709 Atherosclerosis of coronary artery bypass graft(s), unspecified, with unspecified angina pectoris: Secondary | ICD-10-CM

## 2022-05-21 DIAGNOSIS — R011 Cardiac murmur, unspecified: Secondary | ICD-10-CM

## 2022-05-21 MED ORDER — CARVEDILOL 12.5 MG PO TABS: 12.5000 mg | ORAL_TABLET | Freq: Two times a day (BID) | ORAL | 3 refills | Status: DC

## 2022-05-21 MED ORDER — AMLODIPINE BESYLATE 5 MG PO TABS: 5.0000 mg | ORAL_TABLET | Freq: Every day | ORAL | 3 refills | Status: DC

## 2022-05-21 MED ORDER — ISOSORBIDE MONONITRATE ER 60 MG PO TB24: 90.0000 mg | ORAL_TABLET | Freq: Every day | ORAL | 3 refills | Status: DC

## 2022-05-21 NOTE — Progress Notes (Signed)
Cardiology Office Note:    Date:  05/21/2022   ID:  [name] Mar 09, 1956, MRN [id number]  PCP:  Biagio Borg, MD   [facility] Providers Cardiologist:  Candee Furbish, MD     Referring MD: Biagio Borg, MD    History of Present Illness:    [name] is a 66 y.o. female here for the follow-up of CAD CABG COPD palpitations.   COPD exacerbations, on chronic prednisone.    She called the office 06/08/2021 and reported intermittent chest tightness. A nuclear stress test was ordered which on 06/19/2021 was normal with no evidence of ischemia.  At her last appointment she was on chronic oxygen, and noted that her lungs were worsening. She suffered migraines when her oxygen levels were low. At times she noticed sharp chest discomfort, occasional chest pressure. Last stress test in 2019 was low risk. She was advised to trial her isosorbide during the daytime hours instead of taking it at night.  Today:  She says she has been feeling pretty good. However, she states her lungs are not doing well.   She has noticed that she can be completely stationary and will feel her heart increase or decrease randomly, and that she can feel her heart beating. She becomes fatigued easily.  She gets headaches intermittently.   Additionally, she reports trouble sleeping, with prednisone being the only thing she has tried that improves her insomnia. She also states she cannot sleep on the right side of her body, due to difficulty breathing when in that position.  She denies any chest pain, or peripheral edema. No lightheadedness, syncope, or PND.   Past Medical History:  Diagnosis Date   Bronchitis    CAD (coronary artery disease)    a. 02/2002: CABG x3 with LIMA to LAD, RIMA to RCA, and SVG to OM   CHF (congestive heart failure) ([place])    COPD (chronic obstructive pulmonary disease) ([place])    Diabetes ([place])    Headache    Heart disease    Hyperlipidemia    Hypertension    Impaired  glucose tolerance 08/20/2014   Sinusitis     Past Surgical History:  Procedure Laterality Date   CORONARY ARTERY BYPASS GRAFT  2003   Triple bypass   ESOPHAGOGASTRODUODENOSCOPY Left 06/13/2014   Procedure: ESOPHAGOGASTRODUODENOSCOPY (EGD);  Surgeon: Juanita Craver, MD;  Location: [facility] ENDOSCOPY;  Service: Endoscopy;  Laterality: Left;   RIGID ESOPHAGOSCOPY N/A 06/12/2014   Procedure: RIGID ESOPHAGOSCOPY WITH FOREIGN BODY REMOVAL;  Surgeon: Ascencion Dike, MD;  Location: [facility] OR;  Service: ENT;  Laterality: N/A;    Current Medications: Current Meds  Medication Sig   albuterol (VENTOLIN HFA) 108 (90 Base) MCG/ACT inhaler INHALE 1-2 PUFFS BY MOUTH EVERY 6 HOURS AS NEEDED FOR WHEEZE OR SHORTNESS OF BREATH   atorvastatin (LIPITOR) 40 MG tablet TAKE 1 TABLET BY MOUTH EVERY DAY   cholecalciferol (VITAMIN D) 1000 units tablet Take 1,000 Units by mouth daily.   cyclobenzaprine (FLEXERIL) 10 MG tablet Take 1 tablet (10 mg total) by mouth 3 (three) times daily as needed for muscle spasms.   Dulaglutide (TRULICITY) 4.58 KD/9.8PJ SOPN Inject 0.75 mg into the skin once a week.   Fluticasone-Umeclidin-Vilant (TRELEGY ELLIPTA) 200-62.5-25 MCG/ACT AEPB Inhale 1 puff into the lungs daily.   furosemide (LASIX) 40 MG tablet TAKE 1 TABLET BY MOUTH TWICE A DAY   ipratropium (ATROVENT) 0.02 % nebulizer solution Please specify directions, refills and quantity   Melatonin 10 MG  SUBL Place 1 each under the tongue as needed.   meloxicam (MOBIC) 15 MG tablet Take 1 tablet (15 mg total) by mouth daily as needed for pain.   metFORMIN (GLUCOPHAGE-XR) 500 MG 24 hr tablet TAKE 1 TABLET BY MOUTH EVERY DAY WITH BREAKFAST   Multiple Vitamin (MULTIVITAMIN WITH MINERALS) TABS tablet Take 1 tablet by mouth daily.   predniSONE (DELTASONE) 10 MG tablet TAKE 1 TABLET (10 MG TOTAL) BY MOUTH DAILY WITH BREAKFAST.   predniSONE (DELTASONE) 5 MG tablet Take 1 tablet (5 mg total) by mouth daily with breakfast.   Roflumilast (DALIRESP) 250 MCG  TABS Take 1 tablet by mouth daily.   sodium chloride HYPERTONIC 3 % nebulizer solution Take by nebulization as needed for other.   vitamin B-12 (CYANOCOBALAMIN) 1000 MCG tablet Take 1,000 mcg by mouth daily.   vitamin C (ASCORBIC ACID) 500 MG tablet Take 500 mg by mouth daily.    [DISCONTINUED] amLODipine (NORVASC) 5 MG tablet Take 1 tablet (5 mg total) by mouth daily. Call and schedule follow up office visit to receive further refills. [phone] attempt.   [DISCONTINUED] carvedilol (COREG) 12.5 MG tablet Take 1 tablet (12.5 mg total) by mouth 2 (two) times daily with a meal. CALL AND SCHEDULE OVERDUE OFFICE VISIT TO RECEIVE FURTHER REFILLS. [phone]. 2ND ATTEMPT.   [DISCONTINUED] isosorbide mononitrate (IMDUR) 60 MG 24 hr tablet Take 1.5 tablets (90 mg total) by mouth daily. CALL AND SCHEDULE OVERDUE OFFICE VISIT TO RECEIVE FURTHER REFILLS. [phone]. 2ND ATTEMPT.   Current Facility-Administered Medications for the 05/21/22 encounter (Office Visit) with Jerline Pain, MD  Medication   nicotine (NICOTROL) 10 MG inhaler 1 continuous puffing     Allergies:   Augmentin [amoxicillin-pot clavulanate]   Social History   Socioeconomic History   Marital status: Single    Spouse name: Not on file   Number of children: Not on file   Years of education: Not on file   Highest education level: Not on file  Occupational History   Not on file  Tobacco Use   Smoking status: Some Days    Packs/day: 0.50    Years: 47.00    Total pack years: 23.50    Types: Cigarettes    Start date: 12/29/1971    Last attempt to quit: 05/22/2019    Years since quitting: 3.0   Smokeless tobacco: Never   Tobacco comments:    Once she starts feeling better she begins the cycle of smoking again, until she feels bad again         2-3 per day 08/13/2021  Substance and Sexual Activity   Alcohol use: Yes    Alcohol/week: 5.0 - 6.0 standard drinks of alcohol    Types: 5 - 6 Glasses of wine per week   Drug  use: Not Currently    Types: Marijuana    Comment: as a teenager   Sexual activity: Not Currently    Birth control/protection: None  Other Topics Concern   Not on file  Social History Narrative   Not on file   Social Determinants of Health   Financial Resource Strain: Not on file  Food Insecurity: Not on file  Transportation Needs: Not on file  Physical Activity: Not on file  Stress: Not on file  Social Connections: Not on file     Family History: The patient's family history includes Brain cancer in her mother; Cancer in her maternal grandmother and another family member; Heart disease in her mother; Lung cancer in her  maternal aunt and paternal uncle; Stroke in an other family member; Suicidality in her brother.  ROS:   Please see the history of present illness.    (+) Fatigue (+) Insomnia (+) Palpitations  (+) Headaches (+) Shortness of breath (+) Orthopnea All other systems reviewed and are negative.  EKGs/Labs/Other Studies Reviewed:    The following studies were reviewed today:  Lexiscan Stress Test 06/19/2021: Nuclear stress EF: 65%. There was no ST segment deviation noted during stress. The study is normal. This is a low risk study. The left ventricular ejection fraction is normal (55-65%).   Normal stress nuclear study with no ischemia or infarction.  Gated ejection fraction 65% with normal wall motion.  CTA Chest  01/28/2020: FINDINGS: Cardiovascular: Pulmonary arterial opacification is good. There are no pulmonary emboli. Previous median sternotomy. Heart size is normal. There is aortic atherosclerotic calcification without aneurysm or dissection.   Mediastinum/Nodes: No mediastinal or hilar mass or lymphadenopathy.   Lungs/Pleura: Early/minimal emphysematous changes in the upper lungs. No sign of infiltrate, collapse or effusion. A few areas of minor pulmonary scarring. Some narrowing of the trachea and mainstem bronchi that could go along with  tracheomalacia.   Upper Abdomen: Normal   Musculoskeletal: Ordinary mild thoracic degenerative changes.   Review of the MIP images confirms the above findings.   IMPRESSION: 1. No pulmonary emboli or other acute chest pathology. 2. Early/minimal emphysematous changes of the upper lungs. 3. Some narrowing of the trachea and mainstem bronchi that could go along with tracheomalacia.   Aortic Atherosclerosis (ICD10-I70.0) and Emphysema (ICD10-J43.9).  Monitor 04/2018: 68 PVCs (premature ventricular contractions) 130 PACs (premature atrial contractions) Brief paroxysmal atrial tachycardia, 6 beats, 130 bpm. No atrial fibrillation, no pauses, no ventricular tachycardia   Reassuring monitor.  Continue with carvedilol.   Echo  02/09/2017: Study Conclusions   - Left ventricle: The cavity size was normal. Systolic function was    normal. The estimated ejection fraction was in the range of 60%    to 65%. Wall motion was normal; there were no regional wall    motion abnormalities. Features are consistent with a pseudonormal    left ventricular filling pattern, with concomitant abnormal    relaxation and increased filling pressure (grade 2 diastolic    dysfunction). Doppler parameters are consistent with high    ventricular filling pressure.  - Aortic valve: Trileaflet; normal thickness, mildly calcified    leaflets.  - Mitral valve: There was trivial regurgitation.  - Pulmonary arteries: PA peak pressure: 32 mm Hg (S).     EKG:  EKG is personally reviewed.  05/21/2022:  Sinus rhythm. Rate 73 bpm. 03/06/2021:  ectopic atrial rhythm 63   Recent Labs: 03/07/2022: ALT 25; BUN 10; Creatinine, Ser 0.47; Hemoglobin 13.0; Platelets 182.0; Potassium 3.7; Sodium 144; TSH 4.09  Recent Lipid Panel    Component Value Date/Time   CHOL 141 03/07/2022 1020   TRIG 140.0 03/07/2022 1020   HDL 46.80 03/07/2022 1020   CHOLHDL 3 03/07/2022 1020   VLDL 28.0 03/07/2022 1020   LDLCALC 66 03/07/2022  1020   LDLDIRECT 67.0 04/13/2021 1216     Risk Assessment/Calculations:      Physical Exam:    VS:  BP 130/70 (BP Location: Left Arm, Patient Position: Sitting, Cuff Size: Normal)   Pulse 73   Ht '5\' 1"'$  (1.549 m)   Wt 186 lb (84.4 kg)   BMI 35.14 kg/m     Wt Readings from Last 3 Encounters:  05/21/22 186 lb (  84.4 kg)  04/10/22 185 lb 3.2 oz (84 kg)  03/14/22 182 lb (82.6 kg)     GEN:  Well nourished, well developed in no acute distress HEENT: Normal NECK: No JVD; No carotid bruits LYMPHATICS: No lymphadenopathy CARDIAC: RRR, 2/6 systolic murmur, no rubs, no gallops RESPIRATORY:  Clear to auscultation without rales, wheezing or rhonchi  ABDOMEN: Soft, non-tender, non-distended MUSCULOSKELETAL:  No edema; No deformity  SKIN: Warm and dry NEUROLOGIC:  Alert and oriented x 3 PSYCHIATRIC:  Normal affect   ASSESSMENT:    1. Coronary artery disease involving coronary bypass graft of native heart with angina pectoris ([place])   2. Murmur, cardiac     PLAN:    In order of problems listed above:  Coronary artery disease status post CABG with anginal symptoms - Continue with isosorbide currently 90 mg.  No changes made. -Most recent stress test was low risk with no ischemia.  2022.  Normal EF.  Heart murmur - We will go ahead and check an echocardiogram since it has been 5 years.  She did have calcified aortic valve leaflets previously.  We will to make sure that she does not have any evidence of aortic stenosis.  Paroxysmal atrial tachycardia - Currently on carvedilol 12.5 mg twice a day.    Previous EF was normal on echocardiogram.  COPD -Continues with daily prednisone.  Home O2.  Has oxygen on currently.  Diabetes with hypertension - Occasionally blood sugars will become quite elevated with diabetes, prednisone.  Tobacco use - Continue to encourage cessation.  Hyperlipidemia - Continue with high intensity statin atorvastatin 40 mg once a day.  Provide refills as  needed.  Continue with current medication management.  No changes needed    Medication Adjustments/Labs and Tests Ordered: Current medicines are reviewed at length with the patient today.  Concerns regarding medicines are outlined above.  Orders Placed This Encounter  Procedures   EKG 12-Lead   ECHOCARDIOGRAM COMPLETE   Meds ordered this encounter  Medications   isosorbide mononitrate (IMDUR) 60 MG 24 hr tablet    Sig: Take 1.5 tablets (90 mg total) by mouth daily.    Dispense:  135 tablet    Refill:  3   carvedilol (COREG) 12.5 MG tablet    Sig: Take 1 tablet (12.5 mg total) by mouth 2 (two) times daily with a meal.    Dispense:  180 tablet    Refill:  3   amLODipine (NORVASC) 5 MG tablet    Sig: Take 1 tablet (5 mg total) by mouth daily.    Dispense:  90 tablet    Refill:  3    Patient Instructions  Medication Instructions:  The current medical regimen is effective;  continue present plan and medications.  *If you need a refill on your cardiac medications before your next appointment, please call your pharmacy*  Testing/Procedures: Your physician has requested that you have an echocardiogram. Echocardiography is a painless test that uses sound waves to create images of your heart. It provides your doctor with information about the size and shape of your heart and how well your heart's chambers and valves are working. This procedure takes approximately one hour. There are no restrictions for this procedure.  Follow-Up: At [facility], you and your health needs are our priority.  As part of our continuing mission to provide you with exceptional heart care, we have created designated Provider Care Teams.  These Care Teams include your primary Cardiologist (physician) and Advanced Practice Providers (  APPs -  Physician Assistants and Nurse Practitioners) who all work together to provide you with the care you need, when you need it.  We recommend signing up for the patient  portal called "MyChart".  Sign up information is provided on this After Visit Summary.  MyChart is used to connect with patients for Virtual Visits (Telemedicine).  Patients are able to view lab/test results, encounter notes, upcoming appointments, etc.  Non-urgent messages can be sent to your provider as well.   To learn more about what you can do with MyChart, go to [email or web address].    Your next appointment:   1 year(s)  The format for your next appointment:   In Person  Provider:   Candee Furbish, MD {   Important Information About Sugar         I,Mathew Stumpf,acting as a scribe for Candee Furbish, MD.,have documented all relevant documentation on the behalf of Candee Furbish, MD,as directed by  Candee Furbish, MD while in the presence of Candee Furbish, MD.  I, Candee Furbish, MD, have reviewed all documentation for this visit. The documentation on 05/21/22 for the exam, diagnosis, procedures, and orders are all accurate and complete.   Signed, Candee Furbish, MD  05/21/2022 4:21 PM     [facility]

## 2022-05-21 NOTE — Patient Instructions (Signed)
Medication Instructions:  The current medical regimen is effective;  continue present plan and medications.  *If you need a refill on your cardiac medications before your next appointment, please call your pharmacy*  Testing/Procedures: Your physician has requested that you have an echocardiogram. Echocardiography is a painless test that uses sound waves to create images of your heart. It provides your doctor with information about the size and shape of your heart and how well your heart's chambers and valves are working. This procedure takes approximately one hour. There are no restrictions for this procedure.   Follow-Up: At [facility], you and your health needs are our priority.  As part of our continuing mission to provide you with exceptional heart care, we have created designated Provider Care Teams.  These Care Teams include your primary Cardiologist (physician) and Advanced Practice Providers (APPs -  Physician Assistants and Nurse Practitioners) who all work together to provide you with the care you need, when you need it.  We recommend signing up for the patient portal called "MyChart".  Sign up information is provided on this After Visit Summary.  MyChart is used to connect with patients for Virtual Visits (Telemedicine).  Patients are able to view lab/test results, encounter notes, upcoming appointments, etc.  Non-urgent messages can be sent to your provider as well.   To learn more about what you can do with MyChart, go to [email or web address].    Your next appointment:   1 year(s)  The format for your next appointment:   In Person  Provider:   Mark Skains, MD {    Important Information About Sugar       

## 2022-05-23 ENCOUNTER — Other Ambulatory Visit: Payer: Self-pay | Admitting: Cardiology

## 2022-05-27 ENCOUNTER — Ambulatory Visit
Admission: RE | Admit: 2022-05-27 | Discharge: 2022-05-27 | Disposition: A | Discharge status: Home / Self Care | Payer: Medicare HMO | Source: Ambulatory Visit | Attending: Internal Medicine | Admitting: Internal Medicine

## 2022-05-27 DIAGNOSIS — Z1231 Encounter for screening mammogram for malignant neoplasm of breast: Secondary | ICD-10-CM | POA: Diagnosis not present

## 2022-05-29 ENCOUNTER — Ambulatory Visit (HOSPITAL_COMMUNITY): Payer: Medicare HMO | Attending: Cardiology

## 2022-05-29 DIAGNOSIS — I25709 Atherosclerosis of coronary artery bypass graft(s), unspecified, with unspecified angina pectoris: Secondary | ICD-10-CM | POA: Diagnosis not present

## 2022-05-29 DIAGNOSIS — R011 Cardiac murmur, unspecified: Secondary | ICD-10-CM | POA: Insufficient documentation

## 2022-05-29 LAB — ECHOCARDIOGRAM COMPLETE
Area-P 1/2: 3.21 cm2
S' Lateral: 2.8 cm

## 2022-06-04 ENCOUNTER — Encounter: Payer: Self-pay | Admitting: *Deleted

## 2022-06-14 DIAGNOSIS — J449 Chronic obstructive pulmonary disease, unspecified: Secondary | ICD-10-CM | POA: Diagnosis not present

## 2022-06-15 ENCOUNTER — Other Ambulatory Visit: Payer: Self-pay | Admitting: Cardiology

## 2022-06-17 ENCOUNTER — Other Ambulatory Visit: Payer: Self-pay | Admitting: Pulmonary Disease

## 2022-06-20 ENCOUNTER — Ambulatory Visit (INDEPENDENT_AMBULATORY_CARE_PROVIDER_SITE_OTHER): Payer: Medicare HMO

## 2022-06-20 VITALS — Ht 61.0 in | Wt 185.0 lb

## 2022-06-20 DIAGNOSIS — Z0000 Encounter for general adult medical examination without abnormal findings: Secondary | ICD-10-CM

## 2022-06-20 NOTE — Patient Instructions (Signed)
[name] , Thank you for taking time to come for your Medicare Wellness Visit. I appreciate your ongoing commitment to your health goals. Please review the following plan we discussed and let me know if I can assist you in the future.   Screening recommendations/referrals: Colonoscopy: 09/02/2014; due every 10 years Mammogram: 05/27/2022; due every year Bone Density: never done Recommended yearly ophthalmology/optometry visit for glaucoma screening and checkup Recommended yearly dental visit for hygiene and checkup  Vaccinations: Influenza vaccine: 10/04/2021; due Fall 2023 Pneumococcal vaccine: 05/03/2019; due Prevnar 20 Tdap vaccine: declined Shingles vaccine: declined   Covid-19:01/01/2020, 01/25/2020, 08/31/2020  Advanced directives: Yes; Please bring a copy of your health care power of attorney and living will to the office at your convenience.  Conditions/risks identified: Yes  Next appointment: Follow up in one year for your annual wellness visit with Nurse Mignon Pine.   Preventive Care 66 Years and Older, Female Preventive care refers to lifestyle choices and visits with your health care provider that can promote health and wellness. What does preventive care include? A yearly physical exam. This is also called an annual well check. Dental exams once or twice a year. Routine eye exams. Ask your health care provider how often you should have your eyes checked. Personal lifestyle choices, including: Daily care of your teeth and gums. Regular physical activity. Eating a healthy diet. Avoiding tobacco and drug use. Limiting alcohol use. Practicing safe sex. Taking low-dose aspirin every day. Taking vitamin and mineral supplements as recommended by your health care provider. What happens during an annual well check? The services and screenings done by your health care provider during your annual well check will depend on your age, overall health, lifestyle risk factors, and  family history of disease. Counseling  Your health care provider may ask you questions about your: Alcohol use. Tobacco use. Drug use. Emotional well-being. Home and relationship well-being. Sexual activity. Eating habits. History of falls. Memory and ability to understand (cognition). Work and work Statistician. Reproductive health. Screening  You may have the following tests or measurements: Height, weight, and BMI. Blood pressure. Lipid and cholesterol levels. These may be checked every 5 years, or more frequently if you are over 65 years old. Skin check. Lung cancer screening. You may have this screening every year starting at age 66 if you have a 30-pack-year history of smoking and currently smoke or have quit within the past 15 years. Fecal occult blood test (FOBT) of the stool. You may have this test every year starting at age 66. Flexible sigmoidoscopy or colonoscopy. You may have a sigmoidoscopy every 5 years or a colonoscopy every 10 years starting at age 66. Hepatitis C blood test. Hepatitis B blood test. Sexually transmitted disease (STD) testing. Diabetes screening. This is done by checking your blood sugar (glucose) after you have not eaten for a while (fasting). You may have this done every 1-3 years. Bone density scan. This is done to screen for osteoporosis. You may have this done starting at age 66. Mammogram. This may be done every 1-2 years. Talk to your health care provider about how often you should have regular mammograms. Talk with your health care provider about your test results, treatment options, and if necessary, the need for more tests. Vaccines  Your health care provider may recommend certain vaccines, such as: Influenza vaccine. This is recommended every year. Tetanus, diphtheria, and acellular pertussis (Tdap, Td) vaccine. You may need a Td booster every 10 years. Zoster vaccine. You may need this  after age 75. Pneumococcal 13-valent conjugate  (PCV13) vaccine. One dose is recommended after age 66. Pneumococcal polysaccharide (PPSV23) vaccine. One dose is recommended after age 38. Talk to your health care provider about which screenings and vaccines you need and how often you need them. This information is not intended to replace advice given to you by your health care provider. Make sure you discuss any questions you have with your health care provider. Document Released: 11/03/2015 Document Revised: 06/26/2016 Document Reviewed: 08/08/2015 Elsevier Interactive Patient Education  2017 [place] Prevention in the Home Falls can cause injuries. They can happen to people of all ages. There are many things you can do to make your home safe and to help prevent falls. What can I do on the outside of my home? Regularly fix the edges of walkways and driveways and fix any cracks. Remove anything that might make you trip as you walk through a door, such as a raised step or threshold. Trim any bushes or trees on the path to your home. Use bright outdoor lighting. Clear any walking paths of anything that might make someone trip, such as rocks or tools. Regularly check to see if handrails are loose or broken. Make sure that both sides of any steps have handrails. Any raised decks and porches should have guardrails on the edges. Have any leaves, snow, or ice cleared regularly. Use sand or salt on walking paths during winter. Clean up any spills in your garage right away. This includes oil or grease spills. What can I do in the bathroom? Use night lights. Install grab bars by the toilet and in the tub and shower. Do not use towel bars as grab bars. Use non-skid mats or decals in the tub or shower. If you need to sit down in the shower, use a plastic, non-slip stool. Keep the floor dry. Clean up any water that spills on the floor as soon as it happens. Remove soap buildup in the tub or shower regularly. Attach bath mats securely with  double-sided non-slip rug tape. Do not have throw rugs and other things on the floor that can make you trip. What can I do in the bedroom? Use night lights. Make sure that you have a light by your bed that is easy to reach. Do not use any sheets or blankets that are too big for your bed. They should not hang down onto the floor. Have a firm chair that has side arms. You can use this for support while you get dressed. Do not have throw rugs and other things on the floor that can make you trip. What can I do in the kitchen? Clean up any spills right away. Avoid walking on wet floors. Keep items that you use a lot in easy-to-reach places. If you need to reach something above you, use a strong step stool that has a grab bar. Keep electrical cords out of the way. Do not use floor polish or wax that makes floors slippery. If you must use wax, use non-skid floor wax. Do not have throw rugs and other things on the floor that can make you trip. What can I do with my stairs? Do not leave any items on the stairs. Make sure that there are handrails on both sides of the stairs and use them. Fix handrails that are broken or loose. Make sure that handrails are as long as the stairways. Check any carpeting to make sure that it is firmly attached to the  stairs. Fix any carpet that is loose or worn. Avoid having throw rugs at the top or bottom of the stairs. If you do have throw rugs, attach them to the floor with carpet tape. Make sure that you have a light switch at the top of the stairs and the bottom of the stairs. If you do not have them, ask someone to add them for you. What else can I do to help prevent falls? Wear shoes that: Do not have high heels. Have rubber bottoms. Are comfortable and fit you well. Are closed at the toe. Do not wear sandals. If you use a stepladder: Make sure that it is fully opened. Do not climb a closed stepladder. Make sure that both sides of the stepladder are locked  into place. Ask someone to hold it for you, if possible. Clearly mark and make sure that you can see: Any grab bars or handrails. First and last steps. Where the edge of each step is. Use tools that help you move around (mobility aids) if they are needed. These include: Canes. Walkers. Scooters. Crutches. Turn on the lights when you go into a dark area. Replace any light bulbs as soon as they burn out. Set up your furniture so you have a clear path. Avoid moving your furniture around. If any of your floors are uneven, fix them. If there are any pets around you, be aware of where they are. Review your medicines with your doctor. Some medicines can make you feel dizzy. This can increase your chance of falling. Ask your doctor what other things that you can do to help prevent falls. This information is not intended to replace advice given to you by your health care provider. Make sure you discuss any questions you have with your health care provider. Document Released: 08/03/2009 Document Revised: 03/14/2016 Document Reviewed: 11/11/2014 Elsevier Interactive Patient Education  2017 [employer].

## 2022-06-20 NOTE — Progress Notes (Signed)
Subjective:   [name] is a 66 y.o. female who presents for an Initial Medicare Annual Wellness Visit.  Review of Systems    Virtual Visit via Telephone Note  I connected with  [name] on 06/20/22 at  1:30 PM EDT by telephone and verified that I am speaking with the correct person using two identifiers.  Location: Patient: Home Provider: [place] Persons participating in the virtual visit: [place]   I discussed the limitations, risks, security and privacy concerns of performing an evaluation and management service by telephone and the availability of in person appointments. The patient expressed understanding and agreed to proceed.  Interactive audio and video telecommunications were attempted between this nurse and patient, however failed, due to patient having technical difficulties OR patient did not have access to video capability.  We continued and completed visit with audio only.  Some vital signs may be absent or patient reported.   Rhonda Flow, LPN  Cardiac Risk Factors include: advanced age (>42mn, >>91women);diabetes mellitus;dyslipidemia;hypertension;family history of premature cardiovascular disease;obesity (BMI >30kg/m2);sedentary lifestyle     Objective:    Today's Vitals   06/20/22 1344  Weight: 185 lb (83.9 kg)  Height: '5\' 1"'$  (1.549 m)   Body mass index is 34.96 kg/m.     06/20/2022    1:35 PM 01/28/2020    7:56 AM 01/26/2020    1:15 PM 06/26/2019    9:31 PM 01/11/2018   10:40 AM 03/18/2017    7:35 PM 02/08/2017    6:49 AM  Advanced Directives  Does Patient Have a Medical Advance Directive? Yes Yes Yes No No No No  Type of [place];Out of facility DNR (pink MOST or yellow form) [place] (pink MOST or yellow form)      Does patient want to make changes to medical advance  directive?  No - Patient declined No - Patient declined      Copy of [place] Chart? No - copy requested No - copy requested No - copy requested, Physician notified      Would patient like information on creating a medical advance directive?    No - Patient declined No - Patient declined  No - Patient declined    Current Medications (verified) Outpatient Encounter Medications as of 06/20/2022  Medication Sig   albuterol (VENTOLIN HFA) 108 (90 Base) MCG/ACT inhaler INHALE 1-2 PUFFS BY MOUTH EVERY 6 HOURS AS NEEDED FOR WHEEZE OR SHORTNESS OF BREATH   amLODipine (NORVASC) 5 MG tablet Take 1 tablet (5 mg total) by mouth daily.   atorvastatin (LIPITOR) 40 MG tablet TAKE 1 TABLET BY MOUTH EVERY DAY   carvedilol (COREG) 12.5 MG tablet Take 1 tablet (12.5 mg total) by mouth 2 (two) times daily with a meal.   cholecalciferol (VITAMIN D) 1000 units tablet Take 1,000 Units by mouth daily.   cyclobenzaprine (FLEXERIL) 10 MG tablet Take 1 tablet (10 mg total) by mouth 3 (three) times daily as needed for muscle spasms.   Dulaglutide (TRULICITY) 06.76MPP/5.0DTSOPN Inject 0.75 mg into the skin once a week.   Fluticasone-Umeclidin-Vilant (TRELEGY ELLIPTA) 200-62.5-25 MCG/ACT AEPB Inhale 1 puff into the lungs daily.   furosemide (LASIX) 40 MG tablet TAKE 1 TABLET BY MOUTH TWICE A DAY   ipratropium (ATROVENT) 0.02 % nebulizer solution Please specify directions, refills and quantity   isosorbide mononitrate (IMDUR) 60  MG 24 hr tablet Take 1.5 tablets (90 mg total) by mouth daily.   Melatonin 10 MG SUBL Place 1 each under the tongue as needed.   meloxicam (MOBIC) 15 MG tablet Take 1 tablet (15 mg total) by mouth daily as needed for pain.   metFORMIN (GLUCOPHAGE-XR) 500 MG 24 hr tablet TAKE 1 TABLET BY MOUTH EVERY DAY WITH BREAKFAST   Multiple Vitamin (MULTIVITAMIN WITH MINERALS) TABS tablet Take 1 tablet by mouth daily.   predniSONE (DELTASONE) 10 MG tablet TAKE 1 TABLET (10 MG TOTAL) BY  MOUTH DAILY WITH BREAKFAST.   predniSONE (DELTASONE) 5 MG tablet Take 1 tablet (5 mg total) by mouth daily with breakfast.   Roflumilast (DALIRESP) 250 MCG TABS Take 1 tablet by mouth daily.   sodium chloride HYPERTONIC 3 % nebulizer solution Take by nebulization as needed for other.   vitamin B-12 (CYANOCOBALAMIN) 1000 MCG tablet Take 1,000 mcg by mouth daily.   vitamin C (ASCORBIC ACID) 500 MG tablet Take 500 mg by mouth daily.    Facility-Administered Encounter Medications as of 06/20/2022  Medication   nicotine (NICOTROL) 10 MG inhaler 1 continuous puffing   regadenoson (LEXISCAN) injection SOLN 0.4 mg   technetium tetrofosmin (TC-MYOVIEW) injection 30 millicurie    Allergies (verified) Augmentin [amoxicillin-pot clavulanate]   History: Past Medical History:  Diagnosis Date   Bronchitis    CAD (coronary artery disease)    a. 02/2002: CABG x3 with LIMA to LAD, RIMA to RCA, and SVG to OM   CHF (congestive heart failure) ([place])    COPD (chronic obstructive pulmonary disease) ([place])    Diabetes ([place])    Headache    Heart disease    Hyperlipidemia    Hypertension    Impaired glucose tolerance 08/20/2014   Sinusitis    Past Surgical History:  Procedure Laterality Date   CORONARY ARTERY BYPASS GRAFT  2003   Triple bypass   ESOPHAGOGASTRODUODENOSCOPY Left 06/13/2014   Procedure: ESOPHAGOGASTRODUODENOSCOPY (EGD);  Surgeon: Juanita Craver, MD;  Location[facility] ENDOSCOPY;  Service: Endoscopy;  Laterality: Left;   RIGID ESOPHAGOSCOPY N/A 06/12/2014   Procedure: RIGID ESOPHAGOSCOPY WITH FOREIGN BODY REMOVAL;  Surgeon: Ascencion Dike, MD;  Location[facility];  Service: ENT;  Laterality: N/A;   Family History  Problem Relation Age of Onset   Lung cancer Paternal Uncle    Heart disease Mother    Brain cancer Mother    Cancer Maternal Grandmother        colon   Stroke Other    Cancer Other        x 4 aunts   Lung cancer Maternal Aunt    Suicidality Brother    Social History   Socioeconomic  History   Marital status: Single    Spouse name: Not on file   Number of children: Not on file   Years of education: Not on file   Highest education level: Not on file  Occupational History   Not on file  Tobacco Use   Smoking status: Some Days    Packs/day: 0.50    Years: 47.00    Total pack years: 23.50    Types: Cigarettes    Start date: 12/29/1971    Last attempt to quit: 05/22/2019    Years since quitting: 3.0   Smokeless tobacco: Never   Tobacco comments:    Once she starts feeling better she begins the cycle of smoking again, until she feels bad again         2-3  per day 08/13/2021  Substance and Sexual Activity   Alcohol use: Yes    Alcohol/week: 5.0 - 6.0 standard drinks of alcohol    Types: 5 - 6 Glasses of wine per week   Drug use: Not Currently    Types: Marijuana    Comment: as a teenager   Sexual activity: Not Currently    Birth control/protection: None  Other Topics Concern   Not on file  Social History Narrative   Not on file   Social Determinants of Health   Financial Resource Strain: Low Risk  (06/20/2022)   Overall Financial Resource Strain (CARDIA)    Difficulty of Paying Living Expenses: Not hard at all  Food Insecurity: No Food Insecurity (06/20/2022)   Hunger Vital Sign    Worried About Running Out of Food in the Last Year: Never true    Ran Out of Food in the Last Year: Never true  Transportation Needs: No Transportation Needs (06/20/2022)   PRAPARE - Hydrologist (Medical): No    Lack of Transportation (Non-Medical): No  Physical Activity: Inactive (06/20/2022)   Exercise Vital Sign    Days of Exercise per Week: 0 days    Minutes of Exercise per Session: 0 min  Stress: No Stress Concern Present (06/20/2022)   [place] of Stress : Not at all  Social Connections: [place] (06/20/2022)   Social Connection and Isolation Panel  [NHANES]    Frequency of Communication with Friends and Family: More than three times a week    Frequency of Social Gatherings with Friends and Family: More than three times a week    Attends Religious Services: More than 4 times per year    Active Member of [employer] or Organizations: Yes    Attends Music therapist: More than 4 times per year    Marital Status: Married    Tobacco Counseling Ready to quit: Not Answered Counseling given: Not Answered Tobacco comments: Once she starts feeling better she begins the cycle of smoking again, until she feels bad again   2-3 per day 08/13/2021   Clinical Intake:  Pre-visit preparation completed: Yes  Pain : No/denies pain     BMI - recorded: 34.96 Nutritional Status: BMI > 30  Obese Nutritional Risks: None Diabetes: Yes CBG done?: No Did pt. bring in CBG monitor from home?: No  How often do you need to have someone help you when you read instructions, pamphlets, or other written materials from your doctor or pharmacy?: 1 - Never What is the last grade level you completed in school?: 16 years  Nutrition Risk Assessment:  Has the patient had any N/V/D within the last 2 months?  Yes  Does the patient have any non-healing wounds?  No  Has the patient had any unintentional weight loss or weight gain?  Yes   Diabetes:  Is the patient diabetic?  Yes  If diabetic, was a CBG obtained today?  No  Did the patient bring in their glucometer from home?  No  How often do you monitor your CBG's? Every other day.   Financial Strains and Diabetes Management:  Are you having any financial strains with the device, your supplies or your medication? No .  Does the patient want to be seen by Chronic Care Management for management of their diabetes?  No  Would the patient like to be referred to a Nutritionist or for  Diabetic Management?  No   Diabetic Exams:  Diabetic Eye Exam: Completed 03/01/2022. Overdue for diabetic eye exam. Pt  has been advised about the importance in completing this exam. A referral has been placed today. Message sent to referral coordinator for scheduling purposes. Advised pt to expect a call from office referred to regarding appt.  Diabetic Foot Exam: Completed 02/11/2022. Pt has been advised about the importance in completing this exam. Pt is scheduled for diabetic foot exam on 02/12/2023.    Interpreter Needed?: No  Information entered by :: Lisette Abu, LPN.   Activities of Daily Living    06/20/2022    1:50 PM  In your present state of health, do you have any difficulty performing the following activities:  Hearing? 0  Vision? 0  Difficulty concentrating or making decisions? 0  Walking or climbing stairs? 0  Dressing or bathing? 0  Doing errands, shopping? 0  Preparing Food and eating ? N  Using the Toilet? N  In the past six months, have you accidently leaked urine? N  Do you have problems with loss of bowel control? N  Managing your Medications? N  Managing your Finances? N  Housekeeping or managing your Housekeeping? N    Patient Care Team: Biagio Borg, MD as PCP - General (Internal Medicine) Jerline Pain, MD as PCP - Cardiology (Cardiology) Warden Fillers, MD as Consulting Physician (Ophthalmology)  Indicate any recent Medical Services you may have received from other than [facility] providers in the past year (date may be approximate).     Assessment:   This is a routine wellness examination for [place].  Hearing/Vision screen Hearing Screening - Comments:: Denies hearing difficulties   Vision Screening - Comments:: Does not wear any rx glasses - up to date with routine eye exams with Warden Fillers, MD.   Dietary issues and exercise activities discussed: Current Exercise Habits: The patient does not participate in regular exercise at present, Exercise limited by: respiratory conditions(s)   Goals Addressed             This Visit's Progress    My  goal is to feel better and get evaluated for chronic headaches.        Depression Screen    06/20/2022    1:38 PM 03/14/2022    1:14 PM 02/11/2022   10:02 AM 04/13/2021   11:51 AM 04/13/2021   11:43 AM 03/06/2020    3:53 PM 09/06/2019   12:02 PM  PHQ 2/9 Scores  PHQ - 2 Score 0 3 1 0 0 0 0  PHQ- 9 Score  11         Fall Risk    06/20/2022    1:36 PM 03/14/2022    1:15 PM 02/11/2022   10:02 AM 04/13/2021   11:51 AM 04/13/2021   11:39 AM  Fall Risk   Falls in the past year? 0 1 1 0 0  Number falls in past yr: 0 0 0 0 0  Injury with Fall? 0 0 0 0 0  Risk for fall due to : No Fall Risks      Follow up Falls prevention discussed        FALL RISK PREVENTION PERTAINING TO THE HOME:  Any stairs in or around the home? No  If so, are there any without handrails? No  Home free of loose throw rugs in walkways, pet beds, electrical cords, etc? Yes  Adequate lighting in your home to reduce risk of falls? Yes  ASSISTIVE DEVICES UTILIZED TO PREVENT FALLS:  Life alert? No  Use of a cane, walker or w/c? No  Grab bars in the bathroom? Yes  Shower chair or bench in shower? No  Elevated toilet seat or a handicapped toilet? No   TIMED UP AND GO:  Was the test performed? No .  Length of time to ambulate 10 feet: n/a sec.   Appearance of gait: Gait not evaluated during this visit.  Cognitive Function:        06/20/2022    1:51 PM  6CIT Screen  What Year? 0 points  What month? 0 points  What time? 0 points  Count back from 20 0 points  Months in reverse 0 points  Repeat phrase 0 points  Total Score 0 points    Immunizations Immunization History  Administered Date(s) Administered   Influenza Split 07/08/2013   Influenza,inj,Quad PF,6+ Mos 07/31/2015, 09/03/2017, 10/04/2021   PFIZER Comirnaty(Gray Top)Covid-19 Tri-Sucrose Vaccine 08/31/2020   PFIZER(Purple Top)SARS-COV-2 Vaccination 01/01/2020, 01/25/2020   Pneumococcal Polysaccharide-23 02/02/2014, 05/03/2019    TDAP  status: Due, Education has been provided regarding the importance of this vaccine. Advised may receive this vaccine at local pharmacy or Health Dept. Aware to provide a copy of the vaccination record if obtained from local pharmacy or Health Dept. Verbalized acceptance and understanding.  Flu Vaccine status: Due, Education has been provided regarding the importance of this vaccine. Advised may receive this vaccine at local pharmacy or Health Dept. Aware to provide a copy of the vaccination record if obtained from local pharmacy or Health Dept. Verbalized acceptance and understanding.  Pneumococcal vaccine status: Due, Education has been provided regarding the importance of this vaccine. Advised may receive this vaccine at local pharmacy or Health Dept. Aware to provide a copy of the vaccination record if obtained from local pharmacy or Health Dept. Verbalized acceptance and understanding.  Covid-19 vaccine status: Completed vaccines  Qualifies for Shingles Vaccine? Yes   Zostavax completed No   Shingrix Completed?: No.    Education has been provided regarding the importance of this vaccine. Patient has been advised to call insurance company to determine out of pocket expense if they have not yet received this vaccine. Advised may also receive vaccine at local pharmacy or Health Dept. Verbalized acceptance and understanding.  Screening Tests Health Maintenance  Topic Date Due   TETANUS/TDAP  Never done   Zoster Vaccines- Shingrix (1 of 2) Never done   COVID-19 Vaccine (4 - Pfizer series) 10/26/2020   DEXA SCAN  Never done   INFLUENZA VACCINE  05/21/2022   Pneumonia Vaccine 49+ Years old (2 - PCV) 08/13/2022 (Originally 05/02/2020)   HEMOGLOBIN A1C  09/07/2022   FOOT EXAM  02/12/2023   OPHTHALMOLOGY EXAM  03/02/2023   Diabetic kidney evaluation - GFR measurement  03/08/2023   Diabetic kidney evaluation - Urine ACR  03/08/2023   MAMMOGRAM  05/27/2024   COLONOSCOPY (Pts 45-3yr Insurance  coverage will need to be confirmed)  09/02/2024   Hepatitis C Screening  Completed   HPV VACCINES  Aged Out    Health Maintenance  Health Maintenance Due  Topic Date Due   TETANUS/TDAP  Never done   Zoster Vaccines- Shingrix (1 of 2) Never done   COVID-19 Vaccine (4 - Pfizer series) 10/26/2020   DEXA SCAN  Never done   INFLUENZA VACCINE  05/21/2022    Colorectal cancer screening: Type of screening: Colonoscopy. Completed 09/02/2014. Repeat every 10 years  Mammogram status: Completed 05/27/2022. Repeat every year  Bone Density status: never done  Lung Cancer Screening: (Low Dose CT Chest recommended if Age 17-80 years, 30 pack-year currently smoking OR have quit w/in 15years.) does not qualify.   Lung Cancer Screening Referral: no; already patient with pulmonology  Additional Screening:  Hepatitis C Screening: does qualify; Completed 10/19/2015  Vision Screening: Recommended annual ophthalmology exams for early detection of glaucoma and other disorders of the eye. Is the patient up to date with their annual eye exam?  Yes  Who is the provider or what is the name of the office in which the patient attends annual eye exams? Warden Fillers, MD. If pt is not established with a provider, would they like to be referred to a provider to establish care? No .   Dental Screening: Recommended annual dental exams for proper oral hygiene  Community Resource Referral / Chronic Care Management: CRR required this visit?  No   CCM required this visit?  No      Plan:     I have personally reviewed and noted the following in the patient's chart:   Medical and social history Use of alcohol, tobacco or illicit drugs  Current medications and supplements including opioid prescriptions. Patient is not currently taking opioid prescriptions. Functional ability and status Nutritional status Physical activity Advanced directives List of other physicians Hospitalizations, surgeries, and  ER visits in previous 12 months Vitals Screenings to include cognitive, depression, and falls Referrals and appointments  In addition, I have reviewed and discussed with patient certain preventive protocols, quality metrics, and best practice recommendations. A written personalized care plan for preventive services as well as general preventive health recommendations were provided to patient.     Rhonda Flow, LPN   12/01/1733   Nurse Notes:  Patient is cogitatively intact. There were no vitals filed for this visit. Patient provided weight for this visit.

## 2022-06-21 ENCOUNTER — Other Ambulatory Visit: Payer: Self-pay | Admitting: Pulmonary Disease

## 2022-06-28 ENCOUNTER — Ambulatory Visit (INDEPENDENT_AMBULATORY_CARE_PROVIDER_SITE_OTHER): Payer: Medicare HMO | Admitting: Internal Medicine

## 2022-06-28 VITALS — BP 122/68 | HR 85 | Temp 98.9°F | Ht 61.0 in | Wt 184.0 lb

## 2022-06-28 DIAGNOSIS — E1165 Type 2 diabetes mellitus with hyperglycemia: Secondary | ICD-10-CM | POA: Diagnosis not present

## 2022-06-28 DIAGNOSIS — M545 Low back pain, unspecified: Secondary | ICD-10-CM | POA: Diagnosis not present

## 2022-06-28 DIAGNOSIS — R1031 Right lower quadrant pain: Secondary | ICD-10-CM

## 2022-06-28 DIAGNOSIS — E559 Vitamin D deficiency, unspecified: Secondary | ICD-10-CM | POA: Diagnosis not present

## 2022-06-28 DIAGNOSIS — E785 Hyperlipidemia, unspecified: Secondary | ICD-10-CM

## 2022-06-28 LAB — HEPATIC FUNCTION PANEL
ALT: 25 U/L (ref 0–35)
AST: 18 U/L (ref 0–37)
Albumin: 4.4 g/dL (ref 3.5–5.2)
Alkaline Phosphatase: 82 U/L (ref 39–117)
Bilirubin, Direct: 0.1 mg/dL (ref 0.0–0.3)
Total Bilirubin: 0.4 mg/dL (ref 0.2–1.2)
Total Protein: 7.5 g/dL (ref 6.0–8.3)

## 2022-06-28 LAB — BASIC METABOLIC PANEL
BUN: 18 mg/dL (ref 6–23)
CO2: 44 mEq/L — ABNORMAL HIGH (ref 19–32)
Calcium: 9.9 mg/dL (ref 8.4–10.5)
Chloride: 93 mEq/L — ABNORMAL LOW (ref 96–112)
Creatinine, Ser: 0.65 mg/dL (ref 0.40–1.20)
GFR: 91.64 mL/min (ref >60.00)
Glucose, Bld: 139 mg/dL — ABNORMAL HIGH (ref 70–99)
Potassium: 4.4 mEq/L (ref 3.5–5.1)
Sodium: 141 mEq/L (ref 135–145)

## 2022-06-28 LAB — URINALYSIS, ROUTINE W REFLEX MICROSCOPIC
Bilirubin Urine: NEGATIVE
Hgb urine dipstick: NEGATIVE
Ketones, ur: NEGATIVE
Leukocytes,Ua: NEGATIVE
Nitrite: NEGATIVE
Specific Gravity, Urine: 1.015 (ref 1.000–1.030)
Total Protein, Urine: 30 — AB
Urine Glucose: NEGATIVE
Urobilinogen, UA: 1 (ref 0.0–1.0)
pH: 8 (ref 5.0–8.0)

## 2022-06-28 LAB — CBC WITH DIFFERENTIAL/PLATELET
Basophils Absolute: 0.1 10*3/uL (ref 0.0–0.1)
Basophils Relative: 0.4 % (ref 0.0–3.0)
Eosinophils Absolute: 0 10*3/uL (ref 0.0–0.7)
Eosinophils Relative: 0.3 % (ref 0.0–5.0)
HCT: 43.1 % (ref 36.0–46.0)
Hemoglobin: 13.7 g/dL (ref 12.0–15.0)
Lymphocytes Relative: 14.8 % (ref 12.0–46.0)
Lymphs Abs: 1.8 10*3/uL (ref 0.7–4.0)
MCHC: 31.9 g/dL (ref 30.0–36.0)
MCV: 88 fl (ref 78.0–100.0)
Monocytes Absolute: 0.6 10*3/uL (ref 0.1–1.0)
Monocytes Relative: 5.1 % (ref 3.0–12.0)
Neutro Abs: 9.5 10*3/uL — ABNORMAL HIGH (ref 1.4–7.7)
Neutrophils Relative %: 79.4 % — ABNORMAL HIGH (ref 43.0–77.0)
Platelets: 183 10*3/uL (ref 150.0–400.0)
RBC: 4.9 Mil/uL (ref 3.87–5.11)
RDW: 14.1 % (ref 11.5–15.5)
WBC: 11.9 10*3/uL — ABNORMAL HIGH (ref 4.0–10.5)

## 2022-06-28 LAB — LIPID PANEL
Cholesterol: 171 mg/dL (ref 0–200)
HDL: 57.4 mg/dL (ref >39.00)
NonHDL: 113.53
Total CHOL/HDL Ratio: 3
Triglycerides: 235 mg/dL — ABNORMAL HIGH (ref 0.0–149.0)
VLDL: 47 mg/dL — ABNORMAL HIGH (ref 0.0–40.0)

## 2022-06-28 LAB — VITAMIN D 25 HYDROXY (VIT D DEFICIENCY, FRACTURES): VITD: 22.17 ng/mL — ABNORMAL LOW (ref 30.00–100.00)

## 2022-06-28 LAB — HEMOGLOBIN A1C: Hgb A1c MFr Bld: 7 % — ABNORMAL HIGH (ref 4.6–6.5)

## 2022-06-28 LAB — LDL CHOLESTEROL, DIRECT: Direct LDL: 79 mg/dL

## 2022-06-28 LAB — LIPASE: Lipase: 37 U/L (ref 11.0–59.0)

## 2022-06-28 MED ORDER — HYDROCODONE-ACETAMINOPHEN 7.5-325 MG PO TABS: 1.0000 | ORAL_TABLET | Freq: Four times a day (QID) | ORAL | 0 refills | Status: DC | PRN

## 2022-06-28 NOTE — Patient Instructions (Addendum)
Please go to the ED immediately for any worsening fever, chills, pain, weakness or falls (or any other unusual symptoms)  Please take all new medication as prescribed - the pain medication  Please continue all other medications as before, and refills have been done if requested.  Please have the pharmacy call with any other refills you may need.  Please keep your appointments with your specialists as you may have planned  You will be contacted regarding the referral for: CT scan asap  Please go to the LAB at the blood drawing area for the tests to be done  You will be contacted by phone if any changes need to be made immediately.  Otherwise, you will receive a letter about your results with an explanation, but please check with MyChart first.  Please remember to sign up for MyChart if you have not done so, as this will be important to you in the future with finding out test results, communicating by private email, and scheduling acute appointments online when needed.

## 2022-06-28 NOTE — Progress Notes (Unsigned)
Patient ID: [name], female   DOB: 08-26-1956, 66 y.o.   MRN: [id number]        Chief Complaint: follow up RLQ pain with radiation to right low bacj       HPI:  [name] is a 66 y.o. female here to f/u with 1 wk gradually worsening RLQ pain and sore tenderness and radiation to the right lower back area, constant severe, dull and lying on right side makes it worse.  Denies worsening reflux, abd pain, dysphagia, n/v, bowel change or blood.   Denies urinary symptoms such as dysuria, frequency, urgency, flank pain, hematuria or n/v, fever, chills.  No buttock or RLE pain, numbness or weakness.  Pt denies chest pain, increased sob or doe, wheezing, orthopnea, PND, increased LE swelling, palpitations, dizziness or syncope.  Remains on home o2.   Pt refuses ED referral .         Wt Readings from Last 3 Encounters:  06/28/22 184 lb (83.5 kg)  06/20/22 185 lb (83.9 kg)  05/21/22 186 lb (84.4 kg)   BP Readings from Last 3 Encounters:  06/28/22 122/68  05/21/22 130/70  04/10/22 122/62         Past Medical History:  Diagnosis Date   Bronchitis    CAD (coronary artery disease)    a. 02/2002: CABG x3 with LIMA to LAD, RIMA to RCA, and SVG to OM   CHF (congestive heart failure) ([place])    COPD (chronic obstructive pulmonary disease) ([place])    Diabetes ([place])    Headache    Heart disease    Hyperlipidemia    Hypertension    Impaired glucose tolerance 08/20/2014   Sinusitis    Past Surgical History:  Procedure Laterality Date   CORONARY ARTERY BYPASS GRAFT  2003   Triple bypass   ESOPHAGOGASTRODUODENOSCOPY Left 06/13/2014   Procedure: ESOPHAGOGASTRODUODENOSCOPY (EGD);  Surgeon: Juanita Craver, MD;  Location: [facility] ENDOSCOPY;  Service: Endoscopy;  Laterality: Left;   RIGID ESOPHAGOSCOPY N/A 06/12/2014   Procedure: RIGID ESOPHAGOSCOPY WITH FOREIGN BODY REMOVAL;  Surgeon: Ascencion Dike, MD;  Location: [place];  Service: ENT;  Laterality: N/A;    reports that she has been smoking cigarettes. She  started smoking about 50 years ago. She has a 23.50 pack-year smoking history. She has never used smokeless tobacco. She reports current alcohol use of about 5.0 - 6.0 standard drinks of alcohol per week. She reports that she does not currently use drugs after having used the following drugs: Marijuana. family history includes Brain cancer in her mother; Cancer in her maternal grandmother and another family member; Heart disease in her mother; Lung cancer in her maternal aunt and paternal uncle; Stroke in an other family member; Suicidality in her brother. Allergies  Allergen Reactions   Augmentin [Amoxicillin-Pot Clavulanate] Nausea And Vomiting   Current Outpatient Medications on File Prior to Visit  Medication Sig Dispense Refill   albuterol (VENTOLIN HFA) 108 (90 Base) MCG/ACT inhaler INHALE 1-2 PUFFS BY MOUTH EVERY 6 HOURS AS NEEDED FOR WHEEZE OR SHORTNESS OF BREATH 18 g 5   amLODipine (NORVASC) 5 MG tablet Take 1 tablet (5 mg total) by mouth daily. 90 tablet 3   atorvastatin (LIPITOR) 40 MG tablet TAKE 1 TABLET BY MOUTH EVERY DAY 90 tablet 3   carvedilol (COREG) 12.5 MG tablet Take 1 tablet (12.5 mg total) by mouth 2 (two) times daily with a meal. 180 tablet 3   cholecalciferol (VITAMIN D) 1000 units tablet Take 1,000  Units by mouth daily.     cyclobenzaprine (FLEXERIL) 10 MG tablet Take 1 tablet (10 mg total) by mouth 3 (three) times daily as needed for muscle spasms. 40 tablet 2   Dulaglutide (TRULICITY) 9.32 IZ/1.2WP SOPN Inject 0.75 mg into the skin once a week. 6 mL 0   Fluticasone-Umeclidin-Vilant (TRELEGY ELLIPTA) 200-62.5-25 MCG/ACT AEPB Inhale 1 puff into the lungs daily. 60 each 4   furosemide (LASIX) 40 MG tablet TAKE 1 TABLET BY MOUTH TWICE A DAY 180 tablet 1   ipratropium (ATROVENT) 0.02 % nebulizer solution Please specify directions, refills and quantity 100 mL 5   isosorbide mononitrate (IMDUR) 60 MG 24 hr tablet Take 1.5 tablets (90 mg total) by mouth daily. 135 tablet 3    Melatonin 10 MG SUBL Place 1 each under the tongue as needed.     meloxicam (MOBIC) 15 MG tablet Take 1 tablet (15 mg total) by mouth daily as needed for pain. 90 tablet 1   metFORMIN (GLUCOPHAGE-XR) 500 MG 24 hr tablet TAKE 1 TABLET BY MOUTH EVERY DAY WITH BREAKFAST 90 tablet 1   Multiple Vitamin (MULTIVITAMIN WITH MINERALS) TABS tablet Take 1 tablet by mouth daily.     predniSONE (DELTASONE) 10 MG tablet TAKE 1 TABLET (10 MG TOTAL) BY MOUTH DAILY WITH BREAKFAST. 30 tablet 1   predniSONE (DELTASONE) 5 MG tablet Take 1 tablet (5 mg total) by mouth daily with breakfast. 30 tablet 2   Roflumilast (DALIRESP) 250 MCG TABS Take 1 tablet by mouth daily. 30 tablet 11   sodium chloride HYPERTONIC 3 % nebulizer solution Take by nebulization as needed for other. 750 mL 12   vitamin B-12 (CYANOCOBALAMIN) 1000 MCG tablet Take 1,000 mcg by mouth daily.     vitamin C (ASCORBIC ACID) 500 MG tablet Take 500 mg by mouth daily.      Current Facility-Administered Medications on File Prior to Visit  Medication Dose Route Frequency Provider Last Rate Last Admin   nicotine (NICOTROL) 10 MG inhaler 1 continuous puffing  1 continuous puffing Inhalation PRN Rigoberto Noel, MD       regadenoson (LEXISCAN) injection SOLN 0.4 mg  0.4 mg Intravenous Once Fay Records, MD       technetium tetrofosmin (TC-MYOVIEW) injection 30 millicurie  30 millicurie Intravenous Once PRN Fay Records, MD            ROS:  All others reviewed and negative.  Objective        PE:  BP 122/68 (BP Location: Right Arm, Patient Position: Sitting, Cuff Size: Large)   Pulse 85   Temp 98.9 F (37.2 C) (Oral)   Ht '5\' 1"'$  (1.549 m)   Wt 184 lb (83.5 kg)   SpO2 92%   BMI 34.77 kg/m                 Constitutional: Pt appears in NAD               HENT: Head: NCAT.                Right Ear: External ear normal.                 Left Ear: External ear normal.                Eyes: . Pupils are equal, round, and reactive to light. Conjunctivae and  EOM are normal               Nose:  without d/c or deformity               Neck: Neck supple. Gross normal ROM               Cardiovascular: Normal rate and regular rhythm.                 Pulmonary/Chest: Effort normal and breath sounds without rales or wheezing.                Abd:  Soft, mod to severe RLQ tender without guarding or rebound, ND, + BS, no organomegaly;  spine and lower lumbar paravertebral areas nontender without swelling, rash               Neurological: Pt is alert. At baseline orientation, motor grossly intact               Skin: Skin is warm. No rashes, no other new lesions, LE edema - none               Psychiatric: Pt behavior is normal without agitation   Micro: none  Cardiac tracings I have personally interpreted today:  none  Pertinent Radiological findings (summarize): none   Lab Results  Component Value Date   WBC 11.9 (H) 06/28/2022   HGB 13.7 06/28/2022   HCT 43.1 06/28/2022   PLT 183.0 06/28/2022   GLUCOSE 139 (H) 06/28/2022   CHOL 171 06/28/2022   TRIG 235.0 (H) 06/28/2022   HDL 57.40 06/28/2022   LDLDIRECT 79.0 06/28/2022   LDLCALC 66 03/07/2022   ALT 25 06/28/2022   AST 18 06/28/2022   NA 141 06/28/2022   K 4.4 06/28/2022   CL 93 (L) 06/28/2022   CREATININE 0.65 06/28/2022   BUN 18 06/28/2022   CO2 44 (H) 06/28/2022   TSH 4.09 03/07/2022   HGBA1C 7.0 (H) 06/28/2022   MICROALBUR 8.7 (H) 03/07/2022   Assessment/Plan:  [name] is a 66 y.o. Other or two or more races [6] female with  has a past medical history of Bronchitis, CAD (coronary artery disease), CHF (congestive heart failure) ([place]), COPD (chronic obstructive pulmonary disease) ([place]), Diabetes ([place]), Headache, Heart disease, Hyperlipidemia, Hypertension, Impaired glucose tolerance (08/20/2014), and Sinusitis.  Diabetes ([place]) Lab Results  Component Value Date   HGBA1C 7.0 (H) 06/28/2022   Stable, pt to continue current medical treatment trulicity 8.46, metformine ER 500 -  1 per day   HLD (hyperlipidemia) Lab Results  Component Value Date   LDLCALC 66 03/07/2022   Stable, pt to continue current statin lipitor 40 mg qd   Pain, abdominal, RLQ Exam markedly abnormal and concerning, pt again declines referral to ED for immediate attention I am sure would include CT abd/pelvis; for stat CT abd /pelvis, and labs as ordered including cbc, UA  Vitamin D deficiency Last vitamin D Lab Results  Component Value Date   VD25OH 22.17 (L) 06/28/2022   Low, to start oral replacement   Back pain Seems likely referred pain as exam benign, for CT and labs as above  Followup: Return if symptoms worsen or fail to improve.  Cathlean Cower, MD 06/30/2022 2:58 PM [facility]

## 2022-06-29 LAB — URINE CULTURE

## 2022-06-30 ENCOUNTER — Encounter: Payer: Self-pay | Admitting: Internal Medicine

## 2022-06-30 NOTE — Assessment & Plan Note (Signed)
Lab Results  Component Value Date   HGBA1C 7.0 (H) 06/28/2022   Stable, pt to continue current medical treatment trulicity 5.80, metformine ER 500 - 1 per day

## 2022-06-30 NOTE — Assessment & Plan Note (Signed)
Seems likely referred pain as exam benign, for CT and labs as above

## 2022-06-30 NOTE — Assessment & Plan Note (Signed)
Lab Results  Component Value Date   LDLCALC 66 03/07/2022   Stable, pt to continue current statin lipitor 40 mg qd

## 2022-06-30 NOTE — Assessment & Plan Note (Signed)
Last vitamin D Lab Results  Component Value Date   VD25OH 22.17 (L) 06/28/2022   Low, to start oral replacement

## 2022-06-30 NOTE — Assessment & Plan Note (Signed)
Exam markedly abnormal and concerning, pt again declines referral to ED for immediate attention I am sure would include CT abd/pelvis; for stat CT abd /pelvis, and labs as ordered including cbc, UA

## 2022-07-01 ENCOUNTER — Other Ambulatory Visit: Payer: Self-pay

## 2022-07-01 ENCOUNTER — Emergency Department (HOSPITAL_COMMUNITY)
Admission: EM | Admit: 2022-07-01 | Discharge: 2022-07-01 | Disposition: A | Discharge status: Home / Self Care | Payer: Medicare HMO | Attending: Emergency Medicine | Admitting: Emergency Medicine

## 2022-07-01 ENCOUNTER — Encounter (HOSPITAL_COMMUNITY): Payer: Self-pay | Admitting: Emergency Medicine

## 2022-07-01 DIAGNOSIS — B029 Zoster without complications: Secondary | ICD-10-CM | POA: Insufficient documentation

## 2022-07-01 DIAGNOSIS — R21 Rash and other nonspecific skin eruption: Secondary | ICD-10-CM | POA: Diagnosis not present

## 2022-07-01 DIAGNOSIS — Z7951 Long term (current) use of inhaled steroids: Secondary | ICD-10-CM | POA: Diagnosis not present

## 2022-07-01 DIAGNOSIS — J449 Chronic obstructive pulmonary disease, unspecified: Secondary | ICD-10-CM | POA: Diagnosis not present

## 2022-07-01 DIAGNOSIS — Z79899 Other long term (current) drug therapy: Secondary | ICD-10-CM | POA: Diagnosis not present

## 2022-07-01 DIAGNOSIS — Z7984 Long term (current) use of oral hypoglycemic drugs: Secondary | ICD-10-CM | POA: Insufficient documentation

## 2022-07-01 DIAGNOSIS — Z7952 Long term (current) use of systemic steroids: Secondary | ICD-10-CM | POA: Insufficient documentation

## 2022-07-01 DIAGNOSIS — I10 Essential (primary) hypertension: Secondary | ICD-10-CM | POA: Insufficient documentation

## 2022-07-01 DIAGNOSIS — E119 Type 2 diabetes mellitus without complications: Secondary | ICD-10-CM | POA: Insufficient documentation

## 2022-07-01 MED ORDER — VALACYCLOVIR HCL 1 G PO TABS: 1000.0000 mg | ORAL_TABLET | Freq: Three times a day (TID) | ORAL | 0 refills | Status: DC

## 2022-07-01 MED ORDER — OXYCODONE-ACETAMINOPHEN 5-325 MG PO TABS: 1.0000 | ORAL_TABLET | ORAL | 0 refills | Status: DC | PRN

## 2022-07-01 NOTE — ED Notes (Signed)
Pt seen by provider in triage, pt has shingles.  Pt educated by provided and Therapist, sports.  Discharged from triage w/ prescriptions.

## 2022-07-01 NOTE — ED Triage Notes (Signed)
Pt c/o right sided side/back pain X5 days.  Pt has a rash to the area as well.

## 2022-07-01 NOTE — ED Provider Notes (Signed)
[facility] Provider Note   CSN: [id number] Arrival date & time: 07/01/22  6063     History  No chief complaint on file.   Rhonda EHRSAM is a 66 y.o. female.  The history is provided by the patient and medical records.   66 year old female with history of COPD on chronic home O2, diabetes, hypertension, GERD, hyperlipidemia, presenting to the ED with right flank pain for the past week.  States it starts in her right lower back and radiates around her abdomen.  She denies any nausea, vomiting, or diarrhea.  No urinary symptoms.  She does report she noticed a rash a few days ago and has been applying topical cream.  Home Medications Prior to Admission medications   Medication Sig Start Date End Date Taking? Authorizing Provider  oxyCODONE-acetaminophen (PERCOCET) 5-325 MG tablet Take 1 tablet by mouth every 4 (four) hours as needed. 07/01/22  Yes Larene Pickett, PA-C  valACYclovir (VALTREX) 1000 MG tablet Take 1 tablet (1,000 mg total) by mouth 3 (three) times daily. 07/01/22  Yes Larene Pickett, PA-C  albuterol (VENTOLIN HFA) 108 (90 Base) MCG/ACT inhaler INHALE 1-2 PUFFS BY MOUTH EVERY 6 HOURS AS NEEDED FOR WHEEZE OR SHORTNESS OF BREATH 12/03/21   Rigoberto Noel, MD  amLODipine (NORVASC) 5 MG tablet Take 1 tablet (5 mg total) by mouth daily. 05/21/22   Jerline Pain, MD  atorvastatin (LIPITOR) 40 MG tablet TAKE 1 TABLET BY MOUTH EVERY DAY 05/07/22   Biagio Borg, MD  carvedilol (COREG) 12.5 MG tablet Take 1 tablet (12.5 mg total) by mouth 2 (two) times daily with a meal. 06/17/22   Jerline Pain, MD  cholecalciferol (VITAMIN D) 1000 units tablet Take 1,000 Units by mouth daily.    [provider]  cyclobenzaprine (FLEXERIL) 10 MG tablet Take 1 tablet (10 mg total) by mouth 3 (three) times daily as needed for muscle spasms. 03/14/22   Biagio Borg, MD  Dulaglutide (TRULICITY) 0.16 WF/0.9NA SOPN Inject 0.75 mg into the skin once a week.  01/23/22   Biagio Borg, MD  Fluticasone-Umeclidin-Vilant (TRELEGY ELLIPTA) 200-62.5-25 MCG/ACT AEPB Inhale 1 puff into the lungs daily. 04/10/22   Martyn Ehrich, NP  furosemide (LASIX) 40 MG tablet TAKE 1 TABLET BY MOUTH TWICE A DAY 04/02/22   Biagio Borg, MD  HYDROcodone-acetaminophen (NORCO) 7.5-325 MG tablet Take 1 tablet by mouth every 6 (six) hours as needed for moderate pain. 06/28/22   Biagio Borg, MD  ipratropium (ATROVENT) 0.02 % nebulizer solution Please specify directions, refills and quantity 01/08/22   Rigoberto Noel, MD  isosorbide mononitrate (IMDUR) 60 MG 24 hr tablet Take 1.5 tablets (90 mg total) by mouth daily. 05/23/22   Jerline Pain, MD  Melatonin 10 MG SUBL Place 1 each under the tongue as needed.    [provider]  meloxicam (MOBIC) 15 MG tablet Take 1 tablet (15 mg total) by mouth daily as needed for pain. 03/14/22   Biagio Borg, MD  metFORMIN (GLUCOPHAGE-XR) 500 MG 24 hr tablet TAKE 1 TABLET BY MOUTH EVERY DAY WITH BREAKFAST 10/09/21   Biagio Borg, MD  Multiple Vitamin (MULTIVITAMIN WITH MINERALS) TABS tablet Take 1 tablet by mouth daily.    [provider]  predniSONE (DELTASONE) 10 MG tablet TAKE 1 TABLET (10 MG TOTAL) BY MOUTH DAILY WITH BREAKFAST. 06/18/22   Rigoberto Noel, MD  predniSONE (DELTASONE) 5 MG tablet Take 1 tablet (  5 mg total) by mouth daily with breakfast. 01/29/22   Rigoberto Noel, MD  Roflumilast (DALIRESP) 250 MCG TABS Take 1 tablet by mouth daily. 04/10/22   Martyn Ehrich, NP  sodium chloride HYPERTONIC 3 % nebulizer solution Take by nebulization as needed for other. 01/02/22   Rigoberto Noel, MD  vitamin B-12 (CYANOCOBALAMIN) 1000 MCG tablet Take 1,000 mcg by mouth daily.    [provider]  vitamin C (ASCORBIC ACID) 500 MG tablet Take 500 mg by mouth daily.     [provider]      Allergies    Augmentin [amoxicillin-pot clavulanate]    Review of Systems   Review of Systems  Skin:  Positive for rash.   All other systems reviewed and are negative.   Physical Exam Updated Vital Signs BP (!) 143/65   Pulse 86   Temp 99.2 F (37.3 C) (Oral)   Resp 18   Ht '5\' 1"'$  (1.549 m)   Wt 83.5 kg   SpO2 96%   BMI 34.78 kg/m   Physical Exam Vitals and nursing note reviewed.  Constitutional:      Appearance: She is well-developed.  HENT:     Head: Normocephalic and atraumatic.  Eyes:     Conjunctiva/sclera: Conjunctivae normal.     Pupils: Pupils are equal, round, and reactive to light.  Cardiovascular:     Rate and Rhythm: Normal rate and regular rhythm.     Heart sounds: Normal heart sounds.  Pulmonary:     Effort: Pulmonary effort is normal.     Breath sounds: Normal breath sounds.  Abdominal:     General: Bowel sounds are normal.     Palpations: Abdomen is soft.     Comments: Vesicular rash across right flank consistent with shingles, no signs of superimposed infection or cellulitis  Musculoskeletal:        General: Normal range of motion.     Cervical back: Normal range of motion.  Skin:    General: Skin is warm and dry.  Neurological:     Mental Status: She is alert and oriented to person, place, and time.     ED Results / Procedures / Treatments   Labs (all labs ordered are listed, but only abnormal results are displayed) Labs Reviewed - No data to display  EKG None  Radiology No results found.  Procedures Procedures    Medications Ordered in ED Medications - No data to display  ED Course/ Medical Decision Making/ A&P                           Medical Decision Making Risk Prescription drug management.   66 year old female with history of right-sided back/flank pain for the past week.  Also reports rash to the area.  Denies N/V/D or urinary symptoms.  She is afebrile and nontoxic on exam.  She does have vesicular rash across the right flank that is consistent with shingles.  No signs or symptoms concerning for superimposed infection or cellulitis.  Given  her extensive past medical history, will start on antivirals, pain medication.  Encouraged to follow-up with PCP.  Return here for any new or acute changes.  Final Clinical Impression(s) / ED Diagnoses Final diagnoses:  Herpes zoster without complication    Rx / DC Orders ED Discharge Orders          Ordered    oxyCODONE-acetaminophen (PERCOCET) 5-325 MG tablet  Every 4 hours PRN  07/01/22 0631    valACYclovir (VALTREX) 1000 MG tablet  3 times daily        07/01/22 0631              Larene Pickett, PA-C 07/01/22 0636    Mesner, Corene Cornea, MD 07/01/22 [phone]

## 2022-07-01 NOTE — Discharge Instructions (Signed)
Take the prescribed medication as directed. °Follow-up with your primary care doctor. °Return to the ED for new or worsening symptoms. °

## 2022-07-04 ENCOUNTER — Inpatient Hospital Stay: Admission: RE | Admit: 2022-07-04 | Payer: Medicare HMO | Source: Ambulatory Visit

## 2022-07-10 ENCOUNTER — Ambulatory Visit (INDEPENDENT_AMBULATORY_CARE_PROVIDER_SITE_OTHER): Payer: Medicare HMO | Admitting: Internal Medicine

## 2022-07-10 VITALS — BP 134/80 | HR 84 | Temp 98.2°F | Ht 61.0 in | Wt 183.0 lb

## 2022-07-10 DIAGNOSIS — E1165 Type 2 diabetes mellitus with hyperglycemia: Secondary | ICD-10-CM

## 2022-07-10 DIAGNOSIS — R1031 Right lower quadrant pain: Secondary | ICD-10-CM | POA: Diagnosis not present

## 2022-07-10 DIAGNOSIS — I10 Essential (primary) hypertension: Secondary | ICD-10-CM

## 2022-07-10 DIAGNOSIS — Z23 Encounter for immunization: Secondary | ICD-10-CM

## 2022-07-10 NOTE — Patient Instructions (Signed)
You had the flu shot today  Please have the COVID booster and RSV shots done at the pharmacy  Please continue with the plan for the CT scan  Please continue all other medications as before, and refills have been done if requested.  Please have the pharmacy call with any other refills you may need.  Please continue your efforts at being more active, low cholesterol diet, and weight control.  Please keep your appointments with your specialists as you may have planned  Please make an Appointment to return in 4 months, or sooner if needed

## 2022-07-10 NOTE — Progress Notes (Signed)
Patient ID: [name], female   DOB: Oct 31, 1955, 66 y.o.   MRN: [id number]        Chief Complaint: follow up RLQ pain       HPI:  [name] is a 66 y.o. female here after shingles outbreak follow with the rash occurring just one days after her last visit here with pain in the RLQ and side and back.  Rash healed with some post inflammatory spots only to the area in a dermatomal fashion.  Pain still about 7/10, declines pain med for now.  Still plans on having the CT done, which was delayed due to scheduling around transportation and appointments. Pt for  flu shot today, plans to have RSV and covid at the pharmacy Wt Readings from Last 3 Encounters:  07/10/22 183 lb (83 kg)  07/01/22 184 lb 1.4 oz (83.5 kg)  06/28/22 184 lb (83.5 kg)   BP Readings from Last 3 Encounters:  07/10/22 134/80  07/01/22 (!) 143/65  06/28/22 122/68         Past Medical History:  Diagnosis Date   Bronchitis    CAD (coronary artery disease)    a. 02/2002: CABG x3 with LIMA to LAD, RIMA to RCA, and SVG to OM   CHF (congestive heart failure) ([place])    COPD (chronic obstructive pulmonary disease) ([place])    Diabetes ([place])    Headache    Heart disease    Hyperlipidemia    Hypertension    Impaired glucose tolerance 08/20/2014   Sinusitis    Past Surgical History:  Procedure Laterality Date   CORONARY ARTERY BYPASS GRAFT  2003   Triple bypass   ESOPHAGOGASTRODUODENOSCOPY Left 06/13/2014   Procedure: ESOPHAGOGASTRODUODENOSCOPY (EGD);  Surgeon: Juanita Craver, MD;  Location: [facility] ENDOSCOPY;  Service: Endoscopy;  Laterality: Left;   RIGID ESOPHAGOSCOPY N/A 06/12/2014   Procedure: RIGID ESOPHAGOSCOPY WITH FOREIGN BODY REMOVAL;  Surgeon: Ascencion Dike, MD;  Location: [place];  Service: ENT;  Laterality: N/A;    reports that she has been smoking cigarettes. She started smoking about 50 years ago. She has a 23.50 pack-year smoking history. She has never used smokeless tobacco. She reports current alcohol use of about  5.0 - 6.0 standard drinks of alcohol per week. She reports that she does not currently use drugs after having used the following drugs: Marijuana. family history includes Brain cancer in her mother; Cancer in her maternal grandmother and another family member; Heart disease in her mother; Lung cancer in her maternal aunt and paternal uncle; Stroke in an other family member; Suicidality in her brother. Allergies  Allergen Reactions   Augmentin [Amoxicillin-Pot Clavulanate] Nausea And Vomiting   Current Outpatient Medications on File Prior to Visit  Medication Sig Dispense Refill   albuterol (VENTOLIN HFA) 108 (90 Base) MCG/ACT inhaler INHALE 1-2 PUFFS BY MOUTH EVERY 6 HOURS AS NEEDED FOR WHEEZE OR SHORTNESS OF BREATH 18 g 5   amLODipine (NORVASC) 5 MG tablet Take 1 tablet (5 mg total) by mouth daily. 90 tablet 3   atorvastatin (LIPITOR) 40 MG tablet TAKE 1 TABLET BY MOUTH EVERY DAY 90 tablet 3   carvedilol (COREG) 12.5 MG tablet Take 1 tablet (12.5 mg total) by mouth 2 (two) times daily with a meal. 180 tablet 3   cholecalciferol (VITAMIN D) 1000 units tablet Take 1,000 Units by mouth daily.     cyclobenzaprine (FLEXERIL) 10 MG tablet Take 1 tablet (10 mg total) by mouth 3 (three) times daily as needed  for muscle spasms. 40 tablet 2   Dulaglutide (TRULICITY) 2.42 AS/3.4HD SOPN Inject 0.75 mg into the skin once a week. 6 mL 0   Fluticasone-Umeclidin-Vilant (TRELEGY ELLIPTA) 200-62.5-25 MCG/ACT AEPB Inhale 1 puff into the lungs daily. 60 each 4   furosemide (LASIX) 40 MG tablet TAKE 1 TABLET BY MOUTH TWICE A DAY 180 tablet 1   HYDROcodone-acetaminophen (NORCO) 7.5-325 MG tablet Take 1 tablet by mouth every 6 (six) hours as needed for moderate pain. 30 tablet 0   ipratropium (ATROVENT) 0.02 % nebulizer solution Please specify directions, refills and quantity 100 mL 5   isosorbide mononitrate (IMDUR) 60 MG 24 hr tablet Take 1.5 tablets (90 mg total) by mouth daily. 135 tablet 3   Melatonin 10 MG SUBL  Place 1 each under the tongue as needed.     meloxicam (MOBIC) 15 MG tablet Take 1 tablet (15 mg total) by mouth daily as needed for pain. 90 tablet 1   metFORMIN (GLUCOPHAGE-XR) 500 MG 24 hr tablet TAKE 1 TABLET BY MOUTH EVERY DAY WITH BREAKFAST 90 tablet 1   Multiple Vitamin (MULTIVITAMIN WITH MINERALS) TABS tablet Take 1 tablet by mouth daily.     oxyCODONE-acetaminophen (PERCOCET) 5-325 MG tablet Take 1 tablet by mouth every 4 (four) hours as needed. 20 tablet 0   predniSONE (DELTASONE) 10 MG tablet TAKE 1 TABLET (10 MG TOTAL) BY MOUTH DAILY WITH BREAKFAST. 30 tablet 1   predniSONE (DELTASONE) 5 MG tablet Take 1 tablet (5 mg total) by mouth daily with breakfast. 30 tablet 2   Roflumilast (DALIRESP) 250 MCG TABS Take 1 tablet by mouth daily. 30 tablet 11   sodium chloride HYPERTONIC 3 % nebulizer solution Take by nebulization as needed for other. 750 mL 12   valACYclovir (VALTREX) 1000 MG tablet Take 1 tablet (1,000 mg total) by mouth 3 (three) times daily. 21 tablet 0   vitamin B-12 (CYANOCOBALAMIN) 1000 MCG tablet Take 1,000 mcg by mouth daily.     vitamin C (ASCORBIC ACID) 500 MG tablet Take 500 mg by mouth daily.      Current Facility-Administered Medications on File Prior to Visit  Medication Dose Route Frequency Provider Last Rate Last Admin   nicotine (NICOTROL) 10 MG inhaler 1 continuous puffing  1 continuous puffing Inhalation PRN Rigoberto Noel, MD       regadenoson (LEXISCAN) injection SOLN 0.4 mg  0.4 mg Intravenous Once Fay Records, MD       technetium tetrofosmin (TC-MYOVIEW) injection 30 millicurie  30 millicurie Intravenous Once PRN Fay Records, MD            ROS:  All others reviewed and negative.  Objective        PE:  BP 134/80 (BP Location: Left Arm, Patient Position: Sitting, Cuff Size: Large)   Pulse 84   Temp 98.2 F (36.8 C) (Oral)   Ht '5\' 1"'$  (1.549 m)   Wt 183 lb (83 kg)   SpO2 97%   BMI 34.58 kg/m                 Constitutional: Pt appears in NAD                HENT: Head: NCAT.                Right Ear: External ear normal.                 Left Ear: External ear normal.  Eyes: . Pupils are equal, round, and reactive to light. Conjunctivae and EOM are normal               Nose: without d/c or deformity               Neck: Neck supple. Gross normal ROM               Cardiovascular: Normal rate and regular rhythm.                 Pulmonary/Chest: Effort normal and breath sounds without rales or wheezing.                Abd:  Soft, ND, + BS, no organomegaly, tender RLQ without guarding or regbuond               Neurological: Pt is alert. At baseline orientation, motor grossly intact               Skin: Skin is warm. No rashes, no other new lesions, LE edema - none               Psychiatric: Pt behavior is normal without agitation   Micro: none  Cardiac tracings I have personally interpreted today:  none  Pertinent Radiological findings (summarize): none   Lab Results  Component Value Date   WBC 11.9 (H) 06/28/2022   HGB 13.7 06/28/2022   HCT 43.1 06/28/2022   PLT 183.0 06/28/2022   GLUCOSE 139 (H) 06/28/2022   CHOL 171 06/28/2022   TRIG 235.0 (H) 06/28/2022   HDL 57.40 06/28/2022   LDLDIRECT 79.0 06/28/2022   LDLCALC 66 03/07/2022   ALT 25 06/28/2022   AST 18 06/28/2022   NA 141 06/28/2022   K 4.4 06/28/2022   CL 93 (L) 06/28/2022   CREATININE 0.65 06/28/2022   BUN 18 06/28/2022   CO2 44 (H) 06/28/2022   TSH 4.09 03/07/2022   HGBA1C 7.0 (H) 06/28/2022   MICROALBUR 8.7 (H) 03/07/2022   Assessment/Plan:  [name] is a 66 y.o. Other or two or more races [6] female with  has a past medical history of Bronchitis, CAD (coronary artery disease), CHF (congestive heart failure) ([place]), COPD (chronic obstructive pulmonary disease) ([place]), Diabetes ([place]), Headache, Heart disease, Hyperlipidemia, Hypertension, Impaired glucose tolerance (08/20/2014), and Sinusitis.  Pain, abdominal, RLQ possbily c/w PHN, though  cant r/o other, ok to continue with CT abd as planned, and pt declines other tx such as gabapentin for now  Essential hypertension BP Readings from Last 3 Encounters:  07/10/22 134/80  07/01/22 (!) 143/65  06/28/22 122/68   Stable, pt to continue medical treatment norvasc 5 mg qd   Diabetes ([place]) Lab Results  Component Value Date   HGBA1C 7.0 (H) 06/28/2022   Stable, pt to continue current medical treatment trulicity .75 mg weekly, metformin ER 500 mg qd  Followup: Return in about 4 months (around 11/09/2022).  Cathlean Cower, MD 07/13/2022 12:00 PM [facility]

## 2022-07-12 ENCOUNTER — Telehealth: Payer: Self-pay | Admitting: Internal Medicine

## 2022-07-12 NOTE — Telephone Encounter (Signed)
Patient is requesting refill of pain medication be sent to [id number] [place]  Call back number is [phone]

## 2022-07-13 ENCOUNTER — Encounter: Payer: Self-pay | Admitting: Internal Medicine

## 2022-07-13 NOTE — Assessment & Plan Note (Signed)
BP Readings from Last 3 Encounters:  07/10/22 134/80  07/01/22 (!) 143/65  06/28/22 122/68   Stable, pt to continue medical treatment norvasc 5 mg qd

## 2022-07-13 NOTE — Assessment & Plan Note (Signed)
Lab Results  Component Value Date   HGBA1C 7.0 (H) 06/28/2022   Stable, pt to continue current medical treatment trulicity .75 mg weekly, metformin ER 500 mg qd

## 2022-07-13 NOTE — Assessment & Plan Note (Signed)
possbily c/w PHN, though cant r/o other, ok to continue with CT abd as planned, and pt declines other tx such as gabapentin for now

## 2022-07-15 ENCOUNTER — Telehealth: Payer: Self-pay | Admitting: Internal Medicine

## 2022-07-15 DIAGNOSIS — L821 Other seborrheic keratosis: Secondary | ICD-10-CM | POA: Diagnosis not present

## 2022-07-15 DIAGNOSIS — L820 Inflamed seborrheic keratosis: Secondary | ICD-10-CM | POA: Diagnosis not present

## 2022-07-15 DIAGNOSIS — J449 Chronic obstructive pulmonary disease, unspecified: Secondary | ICD-10-CM | POA: Diagnosis not present

## 2022-07-15 NOTE — Telephone Encounter (Signed)
PT calls today in need of their x-ray order to be made again. PT had to originally cancel their first x-ray appointment and now that department is requiring Dr.John put another order in before they can schedule it.   CB: [phone]

## 2022-07-16 ENCOUNTER — Other Ambulatory Visit: Payer: Self-pay | Admitting: Internal Medicine

## 2022-07-16 ENCOUNTER — Inpatient Hospital Stay: Admission: RE | Admit: 2022-07-16 | Payer: Medicare HMO | Source: Ambulatory Visit

## 2022-07-16 DIAGNOSIS — R1031 Right lower quadrant pain: Secondary | ICD-10-CM

## 2022-07-16 NOTE — Telephone Encounter (Signed)
Left message for a call back regarding refill

## 2022-07-16 NOTE — Telephone Encounter (Signed)
Ok ct reordered

## 2022-07-17 ENCOUNTER — Encounter: Payer: Self-pay | Admitting: Pulmonary Disease

## 2022-07-17 ENCOUNTER — Telehealth (INDEPENDENT_AMBULATORY_CARE_PROVIDER_SITE_OTHER): Payer: Medicare HMO | Admitting: Pulmonary Disease

## 2022-07-17 DIAGNOSIS — J9611 Chronic respiratory failure with hypoxia: Secondary | ICD-10-CM | POA: Diagnosis not present

## 2022-07-17 DIAGNOSIS — J9612 Chronic respiratory failure with hypercapnia: Secondary | ICD-10-CM

## 2022-07-17 DIAGNOSIS — J441 Chronic obstructive pulmonary disease with (acute) exacerbation: Secondary | ICD-10-CM | POA: Diagnosis not present

## 2022-07-17 MED ORDER — ALBUTEROL SULFATE (2.5 MG/3ML) 0.083% IN NEBU: 2.5000 mg | INHALATION_SOLUTION | Freq: Four times a day (QID) | RESPIRATORY_TRACT | 12 refills | Status: DC | PRN

## 2022-07-17 NOTE — Telephone Encounter (Signed)
Patient states she was to call back and let CMA know what type of pain she is having - she states it is the same abdominal pain she was having before. Call back number is [phone]

## 2022-07-17 NOTE — Patient Instructions (Addendum)
  X rx for albuterol nebs #5 refills

## 2022-07-17 NOTE — Progress Notes (Signed)
   Subjective:    Patient ID: [name], female    DOB: Sep 19, 1956, 66 y.o.   MRN: [id number]  HPI    I connected with  [name] on 07/17/22 by  video enabled telemedicine application and verified that I am speaking with the correct person using two identifiers.     Location: Patient: Home Provider: Office - [facility] - [address], [address], [place], [place] [address]   I discussed the limitations of evaluation and management by telemedicine and the availability of in person appointments. The patient expressed understanding and agreed to proceed. I also discussed with the patient that there may be a patient responsible charge related to this service. The patient expressed understanding and agreed to proceed.   Patient consented to consult via telephone: Yes People present and their role in pt care: Pt  66 yo smoker for FU of COPD, quit 11/2017 and mild tracheomalacia with chronic hypoxic resp failure on o2 since 2018   Recurrent exacerbations in 2022, requiring maintenance prednisone starting 06/2021.   3 month FU visit Chief Complaint  Patient presents with   Follow-up    Pt states breathing has been worse over last couple weeks. Has been using nebulizer more and feeling fatigued. Pt is recently getting over shingles. Pt needs refill on Atrovent.    Remains on prednisone 10 mg , 3L O2 Breathing continues to be bad , she is unable to describe what she is feeling generalized weakness and sometimes sleeps for a long time. "I have no problems sleeping" Has started daliresp 1 exac over last 3 months Diagnosed with shingles   Significant tests/ events reviewed CT angiogram chest 01/2020-emphysema, mild tracheomalacia   04/2019-pulmonary function test- FVC 1.05 (44% predicted), postbronchodilator ratio 47, postbronchodilator FEV1 0.47 (25% predicted), DLCO 54   Spirometry 02/2017 >> ratio of 59, FEV1 of 42% FVC of 56% ABG 01/2014 was 7.29/66/75/92%   08/2014  >> PSG neg for OSA , Desaturation as low as 85%  Review of Systems  neg for any significant sore throat, dysphagia, itching, sneezing, nasal congestion or excess/ purulent secretions, fever, chills, sweats, unintended wt loss, pleuritic or exertional cp, hempoptysis, orthopnea pnd or change in chronic leg swelling. Also denies presyncope, palpitations, heartburn, abdominal pain, nausea, vomiting, diarrhea or change in bowel or urinary habits, dysuria,hematuria, rash, arthralgias, visual complaints, headache, numbness weakness or ataxia.     Objective:   Physical Exam  No acc muscle use , on [place]      Assessment & Plan:    I will concerned that hypersomnolence indicates hypercarbia. Chronic hypoxic/hypercarbic respiratory failure -continue oxygen at 3 L, maintain saturation 90% and above.  I offered her ABG to check for hypercarbia and she may qualify for NIV based on this.  She states that she feels sleeps fine and does not want to start on NIV.  She will add back to [place] if she changes her mind.  COPD -steroid-dependent, will continue 10 mg of prednisone daily. She continue on Trelegy and we will provide her with albuterol nebs. Vaccinations were discussed including flu, COVID booster and RSV    Total encounter time was 26 minutes

## 2022-07-19 NOTE — Telephone Encounter (Signed)
Patient is calling in stating her pain is an 8/10 stabbing feeling. Is scheduled for CT on Tuesday but is in a lot of pain. Had patient speak with access nurse.

## 2022-07-19 NOTE — Telephone Encounter (Signed)
Nurse Assessment Nurse: Altamease Oiler, RN, Adriana Date/Time (Eastern Time): 07/19/2022 10:38:22 AM Confirm and document reason for call. If symptomatic, describe symptoms. ---pt states she was seen 2 weeks ago and diagnosed with shingles. states she is still experiencing the pain. 8/10 right lower abd pain. goes down to hip, constant. Does the patient have any new or worsening symptoms? ---Yes Will a triage be completed? ---Yes Related visit to physician within the last 2 weeks? ---Yes Does the PT have any chronic conditions? (i.e. diabetes, asthma, this includes High risk factors for pregnancy, etc.) ---Yes List chronic conditions. ---diabetes copd heart disease htn Is this a behavioral health or substance abuse call? ---No Guidelines Guideline Title Affirmed Question Affirmed Notes Nurse Date/Time (Eastern Time) Abdominal Pain - Female [1] SEVERE pain (e.g., excruciating) [2] present > 1 hour Altamease Oiler, RN, Adriana 07/19/2022 10:40:34 AM   Disp. Time Eilene Ghazi Time) Disposition Final User 07/19/2022 10:36:52 AM Send to Urgent Queue Memory Argue 07/19/2022 10:42:18 AM Go to ED Now Yes Altamease Oiler, RN, Adriana Final Disposition 07/19/2022 10:42:18 AM Go to ED Now Yes Altamease Oiler, RN, Darylene Price Disagree/Comply Comply Caller Understands Yes PreDisposition Call Doctor Care Advice Given Per Guideline GO TO ED NOW: CARE ADVICE given per Abdominal Pain - Female (Adult) guideline. NOTHING BY MOUTH: * Do not eat or drink anything for now. ANOTHER ADULT SHOULD DRIVE: * It is better and safer if another adult drives instead of you. Referrals [facility] - ED

## 2022-07-20 ENCOUNTER — Other Ambulatory Visit: Payer: Self-pay

## 2022-07-20 ENCOUNTER — Emergency Department (HOSPITAL_COMMUNITY)
Admission: EM | Admit: 2022-07-20 | Discharge: 2022-07-20 | Disposition: A | Discharge status: Home / Self Care | Payer: Medicare HMO | Attending: Emergency Medicine | Admitting: Emergency Medicine

## 2022-07-20 ENCOUNTER — Encounter (HOSPITAL_COMMUNITY): Payer: Self-pay | Admitting: Emergency Medicine

## 2022-07-20 DIAGNOSIS — Z7951 Long term (current) use of inhaled steroids: Secondary | ICD-10-CM | POA: Diagnosis not present

## 2022-07-20 DIAGNOSIS — Z79899 Other long term (current) drug therapy: Secondary | ICD-10-CM | POA: Diagnosis not present

## 2022-07-20 DIAGNOSIS — J449 Chronic obstructive pulmonary disease, unspecified: Secondary | ICD-10-CM | POA: Insufficient documentation

## 2022-07-20 DIAGNOSIS — Z7984 Long term (current) use of oral hypoglycemic drugs: Secondary | ICD-10-CM | POA: Insufficient documentation

## 2022-07-20 DIAGNOSIS — Z7952 Long term (current) use of systemic steroids: Secondary | ICD-10-CM | POA: Insufficient documentation

## 2022-07-20 DIAGNOSIS — M545 Low back pain, unspecified: Secondary | ICD-10-CM | POA: Diagnosis present

## 2022-07-20 DIAGNOSIS — B0229 Other postherpetic nervous system involvement: Secondary | ICD-10-CM | POA: Insufficient documentation

## 2022-07-20 DIAGNOSIS — I10 Essential (primary) hypertension: Secondary | ICD-10-CM | POA: Diagnosis not present

## 2022-07-20 DIAGNOSIS — I2510 Atherosclerotic heart disease of native coronary artery without angina pectoris: Secondary | ICD-10-CM | POA: Diagnosis not present

## 2022-07-20 DIAGNOSIS — E119 Type 2 diabetes mellitus without complications: Secondary | ICD-10-CM | POA: Diagnosis not present

## 2022-07-20 LAB — COMPREHENSIVE METABOLIC PANEL
ALT: 20 U/L (ref 0–44)
AST: 20 U/L (ref 15–41)
Albumin: 3.8 g/dL (ref 3.5–5.0)
Alkaline Phosphatase: 58 U/L (ref 38–126)
Anion gap: 12 (ref 5–15)
BUN: 9 mg/dL (ref 8–23)
CO2: 39 mmol/L — ABNORMAL HIGH (ref 22–32)
Calcium: 10.1 mg/dL (ref 8.9–10.3)
Chloride: 93 mmol/L — ABNORMAL LOW (ref 98–111)
Creatinine, Ser: 0.49 mg/dL (ref 0.44–1.00)
GFR, Estimated: >60 mL/min (ref >60)
Glucose, Bld: 135 mg/dL — ABNORMAL HIGH (ref 70–99)
Potassium: 3.9 mmol/L (ref 3.5–5.1)
Sodium: 144 mmol/L (ref 135–145)
Total Bilirubin: 0.3 mg/dL (ref 0.3–1.2)
Total Protein: 6.7 g/dL (ref 6.5–8.1)

## 2022-07-20 LAB — URINALYSIS, ROUTINE W REFLEX MICROSCOPIC
Bilirubin Urine: NEGATIVE
Glucose, UA: NEGATIVE mg/dL
Hgb urine dipstick: NEGATIVE
Ketones, ur: NEGATIVE mg/dL
Leukocytes,Ua: NEGATIVE
Nitrite: NEGATIVE
Protein, ur: 30 mg/dL — AB
Specific Gravity, Urine: 1.041 — ABNORMAL HIGH (ref 1.005–1.030)
pH: 5 (ref 5.0–8.0)

## 2022-07-20 LAB — CBC
HCT: 44.8 % (ref 36.0–46.0)
Hemoglobin: 13.5 g/dL (ref 12.0–15.0)
MCH: 28.5 pg (ref 26.0–34.0)
MCHC: 30.1 g/dL (ref 30.0–36.0)
MCV: 94.5 fL (ref 80.0–100.0)
Platelets: 186 10*3/uL (ref 150–400)
RBC: 4.74 MIL/uL (ref 3.87–5.11)
RDW: 13.3 % (ref 11.5–15.5)
WBC: 9 10*3/uL (ref 4.0–10.5)
nRBC: 0 % (ref 0.0–0.2)

## 2022-07-20 LAB — LIPASE, BLOOD: Lipase: 40 U/L (ref 11–51)

## 2022-07-20 MED ORDER — GABAPENTIN 100 MG PO CAPS: 100.0000 mg | ORAL_CAPSULE | Freq: Three times a day (TID) | ORAL | 0 refills | Status: DC

## 2022-07-20 MED ORDER — OXYCODONE-ACETAMINOPHEN 5-325 MG PO TABS: 1.0000 | ORAL_TABLET | Freq: Four times a day (QID) | ORAL | 0 refills | Status: DC | PRN

## 2022-07-20 MED ORDER — OXYCODONE-ACETAMINOPHEN 5-325 MG PO TABS
2.0000 | ORAL_TABLET | Freq: Once | ORAL | Status: AC
  Administered 2022-07-20: 2 via ORAL
  Filled 2022-07-20: qty 2

## 2022-07-20 NOTE — ED Triage Notes (Signed)
Pt c/o right sided back pain that radiates to her abdomen x 3 weeks. States she was treated for shingles for initial pain, but current pain feels different. Denies nausea/vomiting/diarrhea, no urinary symptoms.

## 2022-07-20 NOTE — ED Provider Notes (Signed)
[facility] EMERGENCY DEPARTMENT Provider Note   CSN: [id number] Arrival date & time: 07/20/22  8299     History  Chief Complaint  Patient presents with   Back Pain    [name] is a 66 y.o. female.  The history is provided by the patient and medical records. No language interpreter was used.  Back Pain    [name] is a [phone] with a PMHx of COPD, HTN, T2DM who presents to the ED for pain in her right lower back. She states that the pain developed a few weeks before she developed shingles rash on her right lower side. 3 weeks ago she was seen by her PCP who prescribed her antivirals and oxycodon for pain related to shingles, both of which she has completed. She states that the pain has persisted, moving from her right lower back around to her right abdomen. The oxycodon was relieving, but since she has finished that prescription, she has not found any other relieving modalities. The pain is described as constant, and randmly waxes/wanes in intensity. Today she says it is an 8/10. She denies fever, chills, N/V/D, chest pain, urinary changes, hematuria, hematochezia/melena, sensory or motor changes.   Home Medications Prior to Admission medications   Medication Sig Start Date End Date Taking? Authorizing Provider  albuterol (PROVENTIL) (2.5 MG/3ML) 0.083% nebulizer solution Take 3 mLs (2.5 mg total) by nebulization every 6 (six) hours as needed for wheezing or shortness of breath. 07/17/22   Rigoberto Noel, MD  albuterol (VENTOLIN HFA) 108 (90 Base) MCG/ACT inhaler INHALE 1-2 PUFFS BY MOUTH EVERY 6 HOURS AS NEEDED FOR WHEEZE OR SHORTNESS OF BREATH 12/03/21   Rigoberto Noel, MD  amLODipine (NORVASC) 5 MG tablet Take 1 tablet (5 mg total) by mouth daily. 05/21/22   Jerline Pain, MD  atorvastatin (LIPITOR) 40 MG tablet TAKE 1 TABLET BY MOUTH EVERY DAY 05/07/22   Biagio Borg, MD  carvedilol (COREG) 12.5 MG tablet Take 1 tablet (12.5 mg total) by mouth 2 (two) times  daily with a meal. 06/17/22   Jerline Pain, MD  cholecalciferol (VITAMIN D) 1000 units tablet Take 1,000 Units by mouth daily.    [provider]  cyclobenzaprine (FLEXERIL) 10 MG tablet Take 1 tablet (10 mg total) by mouth 3 (three) times daily as needed for muscle spasms. 03/14/22   Biagio Borg, MD  Dulaglutide (TRULICITY) 9.67 EL/3.8BO SOPN Inject 0.75 mg into the skin once a week. 01/23/22   Biagio Borg, MD  Fluticasone-Umeclidin-Vilant (TRELEGY ELLIPTA) 200-62.5-25 MCG/ACT AEPB Inhale 1 puff into the lungs daily. 04/10/22   Martyn Ehrich, NP  furosemide (LASIX) 40 MG tablet TAKE 1 TABLET BY MOUTH TWICE A DAY 04/02/22   Biagio Borg, MD  HYDROcodone-acetaminophen (NORCO) 7.5-325 MG tablet Take 1 tablet by mouth every 6 (six) hours as needed for moderate pain. 06/28/22   Biagio Borg, MD  isosorbide mononitrate (IMDUR) 60 MG 24 hr tablet Take 1.5 tablets (90 mg total) by mouth daily. 05/23/22   Jerline Pain, MD  Melatonin 10 MG SUBL Place 1 each under the tongue as needed.    [provider]  meloxicam (MOBIC) 15 MG tablet Take 1 tablet (15 mg total) by mouth daily as needed for pain. 03/14/22   Biagio Borg, MD  metFORMIN (GLUCOPHAGE-XR) 500 MG 24 hr tablet TAKE 1 TABLET BY MOUTH EVERY DAY WITH BREAKFAST 10/09/21   Biagio Borg, MD  Multiple  Vitamin (MULTIVITAMIN WITH MINERALS) TABS tablet Take 1 tablet by mouth daily.    [provider]  oxyCODONE-acetaminophen (PERCOCET) 5-325 MG tablet Take 1 tablet by mouth every 4 (four) hours as needed. 07/01/22   Larene Pickett, PA-C  predniSONE (DELTASONE) 10 MG tablet TAKE 1 TABLET (10 MG TOTAL) BY MOUTH DAILY WITH BREAKFAST. 06/18/22   Rigoberto Noel, MD  predniSONE (DELTASONE) 5 MG tablet Take 1 tablet (5 mg total) by mouth daily with breakfast. 01/29/22   Rigoberto Noel, MD  Roflumilast (DALIRESP) 250 MCG TABS Take 1 tablet by mouth daily. 04/10/22   Martyn Ehrich, NP  valACYclovir (VALTREX) 1000 MG tablet Take 1  tablet (1,000 mg total) by mouth 3 (three) times daily. 07/01/22   Larene Pickett, PA-C  vitamin B-12 (CYANOCOBALAMIN) 1000 MCG tablet Take 1,000 mcg by mouth daily.    [provider]  vitamin C (ASCORBIC ACID) 500 MG tablet Take 500 mg by mouth daily.     [provider]      Allergies    Augmentin [amoxicillin-pot clavulanate]    Review of Systems   Review of Systems  Musculoskeletal:  Positive for back pain.  All other systems reviewed and are negative.   Physical Exam Updated Vital Signs BP 132/70   Pulse 73   Temp 98.5 F (36.9 C) (Oral)   Resp (!) 22   SpO2 100%  Physical Exam Vitals and nursing note reviewed.  Constitutional:      General: She is not in acute distress.    Appearance: She is well-developed.  HENT:     Head: Atraumatic.  Eyes:     Conjunctiva/sclera: Conjunctivae normal.  Pulmonary:     Effort: Pulmonary effort is normal.  Musculoskeletal:     Cervical back: Neck supple.  Skin:    Findings: Rash (Well-healed vesicular rash noted to left abdomen and back following dermatomal pattern.  Area tender to palpation) present.  Neurological:     Mental Status: She is alert.  Psychiatric:        Mood and Affect: Mood normal.     ED Results / Procedures / Treatments   Labs (all labs ordered are listed, but only abnormal results are displayed) Labs Reviewed  COMPREHENSIVE METABOLIC PANEL - Abnormal; Notable for the following components:      Result Value   Chloride 93 (*)    CO2 39 (*)    Glucose, Bld 135 (*)    All other components within normal limits  URINALYSIS, ROUTINE W REFLEX MICROSCOPIC - Abnormal; Notable for the following components:   Color, Urine AMBER (*)    APPearance HAZY (*)    Specific Gravity, Urine 1.041 (*)    Protein, ur 30 (*)    Bacteria, UA RARE (*)    All other components within normal limits  LIPASE, BLOOD  CBC    EKG None  Radiology No results found.  Procedures Procedures    Medications  Ordered in ED Medications  oxyCODONE-acetaminophen (PERCOCET/ROXICET) 5-325 MG per tablet 2 tablet (has no administration in time range)    ED Course/ Medical Decision Making/ A&P                           Medical Decision Making Amount and/or Complexity of Data Reviewed Labs: ordered.  Risk Prescription drug management.   BP 132/70   Pulse 73   Temp 98.5 F (36.9 C) (Oral)   Resp Marland Kitchen)  22   SpO2 100%   74:54 AM 66 year old female with a history of CAD, COPD, GERD, polycythemia vera, diabetes, COPD, hypertension, hyperlipidemia presenting today with complaint of back pain.  Patient report 3 weeks ago she developed a rash to the right side of her back that radiates towards her abdomen.  She was seen evaluated for the symptoms and was diagnosed with shingles.  She was prescribed antiviral medication as well as pain medication.  She has finished the full course of the medication but still endorses a history of pain to the affected side.  Pain is sharp burning stabbing sensation waxing waning and currently rates as 8 out of 10.  She does not endorse any fever no chills no urinary symptoms no blood in the urine no nausea vomiting diarrhea.  She denies any recent injury.  No history of kidney stone.  She has ran out of her medication.  On exam this is an obese female resting comfortably in bed appears to be in no acute discomfort.  She is chronically on supplemental oxygen at 3 L.  Skin exam remarkable for dry appearing herpetic lesion to her right lower back wraps around her abdomen following dermatomal pattern.  It is mildly tender to palpation overlying the rash.  Abdomen otherwise soft and nontender.  Labs obtained independently viewed interpreted by me.  Labs overall reassuring no significant electrolyte derangement.  Urinalysis without signs of urinary tract infection.  This patient presents to the ED for concern of back pain, this involves an extensive number of treatment options, and is  a complaint that carries with it a high risk of complications and morbidity.  The differential diagnosis includes post herpetic neuralgia, kidney stone, cellulitis, MSK, appendicitis, cholecystitis  Co morbidities that complicate the patient evaluation shingle Additional history obtained:  Additional history obtained from family member External records from outside source obtained and reviewed including EMR including labs and imaging  Lab Tests:  I Ordered, and personally interpreted labs.  The pertinent results include:  as above   Cardiac Monitoring:  The patient was maintained on a cardiac monitor.  I personally viewed and interpreted the cardiac monitored which showed an underlying rhythm of: NSR  Medicines ordered and prescription drug management:  I ordered medication including percocet  for pain Reevaluation of the patient after these medicines showed that the patient improved I have reviewed the patients home medicines and have made adjustments as needed  Test Considered: as above  Critical Interventions: opiate medication   Problem List / ED Course:    Reevaluation:  After the interventions noted above, I reevaluated the patient and found that they have :improved  Social Determinants of Health: tobacco use  Dispostion:  After consideration of the diagnostic results and the patients response to treatment, I feel that the patent would benefit from outpt f/u.         Final Clinical Impression(s) / ED Diagnoses Final diagnoses:  Post herpetic neuralgia    Rx / DC Orders ED Discharge Orders          Ordered    oxyCODONE-acetaminophen (PERCOCET) 5-325 MG tablet  Every 6 hours PRN,   Status:  Discontinued        07/20/22 1248    gabapentin (NEURONTIN) 100 MG capsule  3 times daily        07/20/22 1248    oxyCODONE-acetaminophen (PERCOCET) 5-325 MG tablet  Every 6 hours PRN        07/20/22 1248  Domenic Moras, PA-C 07/20/22  1255    Davonna Belling, MD 07/20/22 [phone]

## 2022-07-23 ENCOUNTER — Ambulatory Visit
Admission: RE | Admit: 2022-07-23 | Discharge: 2022-07-23 | Disposition: A | Discharge status: Home / Self Care | Payer: Medicare HMO | Source: Ambulatory Visit | Attending: Internal Medicine | Admitting: Internal Medicine

## 2022-07-23 DIAGNOSIS — I700 Atherosclerosis of aorta: Secondary | ICD-10-CM | POA: Diagnosis not present

## 2022-07-23 DIAGNOSIS — N858 Other specified noninflammatory disorders of uterus: Secondary | ICD-10-CM | POA: Diagnosis not present

## 2022-07-23 DIAGNOSIS — K7689 Other specified diseases of liver: Secondary | ICD-10-CM | POA: Diagnosis not present

## 2022-07-23 DIAGNOSIS — R1031 Right lower quadrant pain: Secondary | ICD-10-CM | POA: Diagnosis not present

## 2022-07-23 MED ORDER — IOPAMIDOL (ISOVUE-300) INJECTION 61%: 100.0000 mL | Freq: Once | INTRAVENOUS | Status: DC | PRN

## 2022-07-23 MED ORDER — IOPAMIDOL (ISOVUE-300) INJECTION 61%
100.0000 mL | Freq: Once | INTRAVENOUS | Status: AC | PRN
  Administered 2022-07-23: 100 mL via INTRAVENOUS

## 2022-07-29 ENCOUNTER — Telehealth: Payer: Self-pay | Admitting: *Deleted

## 2022-07-29 ENCOUNTER — Encounter: Payer: Self-pay | Admitting: *Deleted

## 2022-07-29 NOTE — Patient Instructions (Signed)
Visit Information  Thank you for taking time to visit with me today. Please don't hesitate to contact me if I can be of assistance to you.   Following are the goals we discussed today:   Goals Addressed             This Visit's Progress    COMPLETED: Care Coordination Activities: No follow up required   On track    Care Coordination Interventions: Evaluation of current treatment plan related to post-herpetic neuralgia and patient's adherence to plan as established by provider Advised patient to provide appropriate vaccination information to provider or CM team member at next visit Reviewed scheduled/upcoming provider appointments including PCP- 11/14/21 Advised patient to discuss results of recent CT scan with provider Assessed social determinant of health barriers Confirmed patient attended Medicare Annual Wellness Visit on 06/20/22; confirmed patient obtained flu vaccine for 2023-24 flu/ winter season; discussed/ provided education/ reminder around safe infection preventions practices to employ during 2023-24 flu/ winter season; confirmed no medication concerns         If you are experiencing a Mental Health or [place] or need someone to talk to, please  call the Suicide and Crisis Lifeline: 988 call the [place] National Suicide Prevention Lifeline: 236-491-7714 or TTY: 325-316-7811 TTY 4020261766) to talk to a trained counselor call 1-800-273-TALK (toll free, 24 hour hotline) go to [facility] [address] [place] [phone]) call the [place]: [phone] call 911   Patient verbalizes understanding of instructions and care plan provided today and agrees to view in [place]. Active MyChart status and patient understanding of how to access instructions and care plan via MyChart confirmed with patient.     No further follow up required: no ongoing care coordination needs identified today  Oneta Rack, RN, BSN, CCRN Alumnus RN CM Care Coordination/ Transition of [place] [facility] [phone]: direct office

## 2022-07-29 NOTE — Patient Outreach (Signed)
  Care Coordination   Initial Visit Note   07/29/2022 Name: [name] MRN: [id number] DOB: 1956/07/04  [name] is a 66 y.o. year old female who sees Biagio Borg, MD for primary care. I spoke with  [name] by phone today.  What matters to the patients health and wellness today?  "I am doing much better; I am not having to take any pain medicine, the shingles finally seems to be completely gone and healed up.... I am not having any problems with anything, things are going well right now; I am taking my medicine like dr. Jenny Reichmann tells me to and I don't have any questions or concerns"  Interventions provided; no further or ongoing care coordination needs identified today    Goals Addressed             This Visit's Progress    COMPLETED: Care Coordination Activities: No follow up required   On track    Care Coordination Interventions: Evaluation of current treatment plan related to post-herpetic neuralgia and patient's adherence to plan as established by provider Advised patient to provide appropriate vaccination information to provider or CM team member at next visit Reviewed scheduled/upcoming provider appointments including PCP- 11/14/21 Advised patient to discuss results of recent CT scan with provider Assessed social determinant of health barriers Confirmed patient attended Medicare Annual Wellness Visit on 06/20/22; confirmed patient obtained flu vaccine for 2023-24 flu/ winter season; discussed/ provided education/ reminder around safe infection preventions practices to employ during 2023-24 flu/ winter season; confirmed no medication concerns         SDOH assessments and interventions completed:  Yes  SDOH Interventions Today    Flowsheet Row Most Recent Value  SDOH Interventions   Food Insecurity Interventions Intervention Not Indicated  Housing Interventions Intervention Not Indicated  [reports resides with sister]  Transportation Interventions  Intervention Not Indicated  [drives self]       Care Coordination Interventions Activated:  Yes  Care Coordination Interventions:  Yes, provided   Follow up plan: No further intervention required.   Encounter Outcome:  Pt. Visit Completed   Oneta Rack, RN, BSN, CCRN Alumnus RN CM Care Coordination/ Transition of [place] [facility] [phone]: direct office

## 2022-08-11 ENCOUNTER — Other Ambulatory Visit: Payer: Self-pay | Admitting: Pulmonary Disease

## 2022-08-13 ENCOUNTER — Ambulatory Visit: Payer: Medicare HMO | Admitting: Internal Medicine

## 2022-08-14 DIAGNOSIS — J449 Chronic obstructive pulmonary disease, unspecified: Secondary | ICD-10-CM | POA: Diagnosis not present

## 2022-08-19 ENCOUNTER — Other Ambulatory Visit: Payer: Self-pay | Admitting: Pulmonary Disease

## 2022-08-26 ENCOUNTER — Encounter (HOSPITAL_BASED_OUTPATIENT_CLINIC_OR_DEPARTMENT_OTHER): Payer: Self-pay | Admitting: Pulmonary Disease

## 2022-08-29 ENCOUNTER — Ambulatory Visit: Payer: Self-pay | Admitting: *Deleted

## 2022-08-29 ENCOUNTER — Ambulatory Visit (INDEPENDENT_AMBULATORY_CARE_PROVIDER_SITE_OTHER): Payer: Medicare HMO

## 2022-08-29 ENCOUNTER — Encounter: Payer: Self-pay | Admitting: Adult Health

## 2022-08-29 ENCOUNTER — Other Ambulatory Visit: Payer: Self-pay | Admitting: Internal Medicine

## 2022-08-29 ENCOUNTER — Ambulatory Visit: Payer: Medicare HMO | Admitting: Adult Health

## 2022-08-29 VITALS — BP 108/58 | HR 86 | Temp 98.1°F | Ht 61.0 in | Wt 181.0 lb

## 2022-08-29 DIAGNOSIS — F17210 Nicotine dependence, cigarettes, uncomplicated: Secondary | ICD-10-CM

## 2022-08-29 DIAGNOSIS — R69 Illness, unspecified: Secondary | ICD-10-CM | POA: Diagnosis not present

## 2022-08-29 DIAGNOSIS — R059 Cough, unspecified: Secondary | ICD-10-CM | POA: Diagnosis not present

## 2022-08-29 DIAGNOSIS — J449 Chronic obstructive pulmonary disease, unspecified: Secondary | ICD-10-CM | POA: Diagnosis not present

## 2022-08-29 DIAGNOSIS — Z720 Tobacco use: Secondary | ICD-10-CM

## 2022-08-29 DIAGNOSIS — J4489 Other specified chronic obstructive pulmonary disease: Secondary | ICD-10-CM | POA: Diagnosis not present

## 2022-08-29 DIAGNOSIS — J9611 Chronic respiratory failure with hypoxia: Secondary | ICD-10-CM | POA: Diagnosis not present

## 2022-08-29 DIAGNOSIS — I2510 Atherosclerotic heart disease of native coronary artery without angina pectoris: Secondary | ICD-10-CM | POA: Diagnosis not present

## 2022-08-29 MED ORDER — PREDNISONE 10 MG PO TABS: ORAL_TABLET | ORAL | 0 refills | Status: DC

## 2022-08-29 MED ORDER — DOXYCYCLINE HYCLATE 100 MG PO TABS: 100.0000 mg | ORAL_TABLET | Freq: Two times a day (BID) | ORAL | 0 refills | Status: DC

## 2022-08-29 NOTE — Addendum Note (Signed)
Addended by: Vanessa Barbara on: 08/29/2022 10:02 AM   Modules accepted: Orders

## 2022-08-29 NOTE — Telephone Encounter (Signed)
Please refill as per office routine med refill policy (all routine meds to be refilled for 3 mo or monthly (per pt preference) up to one year from last visit, then month to month grace period for 3 mo, then further med refills will have to be denied) ? ?

## 2022-08-29 NOTE — Assessment & Plan Note (Signed)
Smoking cessation  

## 2022-08-29 NOTE — Patient Instructions (Addendum)
Doxycycline '100mg'$  Twice daily , take with food, wear sunscreen  Mucinex DM Twice daily As needed  cough/congestion  Prednisone taper over next week.  Continue on Trelegy 1 puff daily, rinse after use  Albuterol inhaler /neb As needed   Chest xray today  Activity as tolerated.  Work on not smoking.  Continue on Oxygen 3l/m , goal is to keep O2 sats >88-90%.  Follow up with Dr. Elsworth Soho  2-3 months and As needed   Please contact office for sooner follow up if symptoms do not improve or worsen or seek emergency care

## 2022-08-29 NOTE — Assessment & Plan Note (Signed)
Continue on oxygen to maintain O2 saturations greater than 88 to 90%. 

## 2022-08-29 NOTE — Progress Notes (Signed)
$'@Patient'k$  ID: [name], female    DOB: 11-19-1955, 66 y.o.   MRN: [id number]  Chief Complaint  Patient presents with   Acute Visit    Referring provider: Biagio Borg, MD  HPI: 66 year old female active smoker followed for very severe COPD and chronic hypoxic respiratory failure on oxygen Medical history significant for diabetes  TEST/EVENTS :  CT angiogram chest 01/2020-emphysema, mild tracheomalacia   04/2019-pulmonary function test- FVC 1.05 (44% predicted), postbronchodilator ratio 47, postbronchodilator FEV1 0.47 (25% predicted), DLCO 54   Spirometry 02/2017 >> ratio of 59, FEV1 of 42% FVC of 56% ABG 01/2014 was 7.29/66/75/92%   08/2014 >> PSG neg for OSA , Desaturation as low as 85%  Alpha 1 MS 143   08/29/2022 Acute OV : COPD, O2 RF  Patient presents for an acute office visit.  She complains over the last 2-week that she has had increased cough congestion and shortness of breath.  Patient says mucus is very thick and brown.  She is getting short of breath with increased activity.  Has increased wheezing.  Patient did increase her prednisone up to 20 mg for the last week without significant improvement. She remains on Trelegy inhaler daily.  Uses albuterol nebulizer at least 2-3 times a day.  She is on chronic prednisone with baseline dose at 10 mg daily. 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 Appetite is fair with no nausea vomiting diarrhea.  Denies any fever, chest pain, orthopnea, hemoptysis, edema. Patient is still smoking.  We discussed smoking cessation in detail.  Allergies  Allergen Reactions   Augmentin [Amoxicillin-Pot Clavulanate] Nausea And Vomiting    Immunization History  Administered Date(s) Administered   Fluad Quad(high Dose 65+) 07/10/2022   Influenza Split 07/08/2013   Influenza,inj,Quad PF,6+ Mos 07/31/2015, 09/03/2017, 10/04/2021   PFIZER Comirnaty(Gray Top)Covid-19 Tri-Sucrose Vaccine 08/31/2020   PFIZER(Purple  Top)SARS-COV-2 Vaccination 01/01/2020, 01/25/2020   Pneumococcal Polysaccharide-23 02/02/2014, 05/03/2019    Past Medical History:  Diagnosis Date   Bronchitis    CAD (coronary artery disease)    a. 02/2002: CABG x3 with LIMA to LAD, RIMA to RCA, and SVG to OM   CHF (congestive heart failure) (HCC)    COPD (chronic obstructive pulmonary disease) ([place])    Diabetes ([place])    Headache    Heart disease    Hyperlipidemia    Hypertension    Impaired glucose tolerance 08/20/2014   Sinusitis     Tobacco History: Social History   Tobacco Use  Smoking Status Some Days   Packs/day: 0.50   Years: 47.00   Total pack years: 23.50   Types: Cigarettes   Start date: 12/29/1971   Last attempt to quit: 05/22/2019   Years since quitting: 3.2  Smokeless Tobacco Never  Tobacco Comments   Smoking daily 1-2 day.  Stops for a while and then goes back.  08/29/2022 hfb   Ready to quit: Not Answered Counseling given: Not Answered Tobacco comments: Smoking daily 1-2 day.  Stops for a while and then goes back.  08/29/2022 hfb   Outpatient Medications Prior to Visit  Medication Sig Dispense Refill   albuterol (PROVENTIL) (2.5 MG/3ML) 0.083% nebulizer solution Take 3 mLs (2.5 mg total) by nebulization every 6 (six) hours as needed for wheezing or shortness of breath. 75 mL 12   albuterol (VENTOLIN HFA) 108 (90 Base) MCG/ACT inhaler INHALE 1-2 PUFFS BY MOUTH EVERY 6 HOURS AS NEEDED FOR WHEEZE OR SHORTNESS OF BREATH 18 g 5  amLODipine (NORVASC) 5 MG tablet Take 1 tablet (5 mg total) by mouth daily. 90 tablet 3   atorvastatin (LIPITOR) 40 MG tablet TAKE 1 TABLET BY MOUTH EVERY DAY 90 tablet 3   carvedilol (COREG) 12.5 MG tablet Take 1 tablet (12.5 mg total) by mouth 2 (two) times daily with a meal. 180 tablet 3   cholecalciferol (VITAMIN D) 1000 units tablet Take 1,000 Units by mouth daily.     cyclobenzaprine (FLEXERIL) 10 MG tablet Take 1 tablet (10 mg total) by mouth 3 (three) times daily as needed for  muscle spasms. 40 tablet 2   Dulaglutide (TRULICITY) 9.47 SJ/6.2EZ SOPN Inject 0.75 mg into the skin once a week. 6 mL 0   Fluticasone-Umeclidin-Vilant (TRELEGY ELLIPTA) 200-62.5-25 MCG/ACT AEPB Inhale 1 puff into the lungs daily. 60 each 4   furosemide (LASIX) 40 MG tablet TAKE 1 TABLET BY MOUTH TWICE A DAY 180 tablet 1   gabapentin (NEURONTIN) 100 MG capsule Take 1 capsule (100 mg total) by mouth 3 (three) times daily. 30 capsule 0   HYDROcodone-acetaminophen (NORCO) 7.5-325 MG tablet Take 1 tablet by mouth every 6 (six) hours as needed for moderate pain. 30 tablet 0   isosorbide mononitrate (IMDUR) 60 MG 24 hr tablet Take 1.5 tablets (90 mg total) by mouth daily. 135 tablet 3   Melatonin 10 MG SUBL Place 1 each under the tongue as needed.     meloxicam (MOBIC) 15 MG tablet Take 1 tablet (15 mg total) by mouth daily as needed for pain. 90 tablet 1   metFORMIN (GLUCOPHAGE-XR) 500 MG 24 hr tablet TAKE 1 TABLET BY MOUTH EVERY DAY WITH BREAKFAST 90 tablet 1   Multiple Vitamin (MULTIVITAMIN WITH MINERALS) TABS tablet Take 1 tablet by mouth daily.     oxyCODONE-acetaminophen (PERCOCET) 5-325 MG tablet Take 1 tablet by mouth every 6 (six) hours as needed. 10 tablet 0   predniSONE (DELTASONE) 10 MG tablet TAKE 1 TABLET (10 MG TOTAL) BY MOUTH DAILY WITH BREAKFAST. 30 tablet 2   valACYclovir (VALTREX) 1000 MG tablet Take 1 tablet (1,000 mg total) by mouth 3 (three) times daily. 21 tablet 0   vitamin B-12 (CYANOCOBALAMIN) 1000 MCG tablet Take 1,000 mcg by mouth daily.     vitamin C (ASCORBIC ACID) 500 MG tablet Take 500 mg by mouth daily.      Roflumilast (DALIRESP) 250 MCG TABS Take 1 tablet by mouth daily. (Patient not taking: Reported on 08/29/2022) 30 tablet 11   ipratropium-albuterol (DUONEB) 0.5-2.5 (3) MG/3ML SOLN TAKE 3 MLS BY NEBULIZATION EVERY 6 (SIX) HOURS AS NEEDED. (Patient not taking: Reported on 08/29/2022) 360 mL 11   Facility-Administered Medications Prior to Visit  Medication Dose Route  Frequency Provider Last Rate Last Admin   nicotine (NICOTROL) 10 MG inhaler 1 continuous puffing  1 continuous puffing Inhalation PRN Rigoberto Noel, MD       regadenoson (LEXISCAN) injection SOLN 0.4 mg  0.4 mg Intravenous Once Fay Records, MD       technetium tetrofosmin (TC-MYOVIEW) injection 30 millicurie  30 millicurie Intravenous Once PRN Fay Records, MD         Review of Systems:   Constitutional:   No  weight loss, night sweats,  Fevers, chills,  +fatigue, or  lassitude.  HEENT:   No headaches,  Difficulty swallowing,  Tooth/dental problems, or  Sore throat,                No sneezing, itching, ear ache,  +nasal  congestion, post nasal drip,   CV:  No chest pain,  Orthopnea, PND, swelling in lower extremities, anasarca, dizziness, palpitations, syncope.   GI  No heartburn, indigestion, abdominal pain, nausea, vomiting, diarrhea, change in bowel habits, loss of appetite, bloody stools.   Resp: .  No chest wall deformity  Skin: no rash or lesions.  GU: no dysuria, change in color of urine, no urgency or frequency.  No flank pain, no hematuria   MS:  No joint pain or swelling.  No decreased range of motion.  No back pain.    Physical Exam  BP (!) 108/58 (BP Location: Left Arm, Patient Position: Sitting, Cuff Size: Normal)   Pulse 86   Temp 98.1 F (36.7 C) (Oral)   Ht '5\' 1"'$  (1.549 m)   Wt 181 lb (82.1 kg)   SpO2 91%   BMI 34.20 kg/m   GEN: A/Ox3; pleasant , NAD, well nourished , On O2    HEENT:  [place]/AT,  NOSE-clear, THROAT-clear, no lesions, no postnasal drip or exudate noted.   NECK:  Supple w/ fair ROM; no JVD; normal carotid impulses w/o bruits; no thyromegaly or nodules palpated; no lymphadenopathy.    RESP few scattered rhonchi, no accessory muscle use, no dullness to percussion  CARD:  RRR, no m/r/g, no peripheral edema, pulses intact, no cyanosis or clubbing.  GI:   Soft & nt; nml bowel sounds; no organomegaly or masses detected.   Musco: Warm bil,  no deformities or joint swelling noted.   Neuro: alert, no focal deficits noted.    Skin: Warm, no lesions or rashes    Lab Results:     Imaging: No results found.       Latest Ref Rng & Units 05/03/2019    3:06 PM  PFT Results  FVC-Pre L 1.05   FVC-Predicted Pre % 44   FVC-Post L 1.00   FVC-Predicted Post % 42   Pre FEV1/FVC % % 49   Post FEV1/FCV % % 47   FEV1-Pre L 0.52   FEV1-Predicted Pre % 28   FEV1-Post L 0.47   DLCO uncorrected ml/min/mmHg 10.16   DLCO UNC% % 54   DLCO corrected ml/min/mmHg 9.48   DLCO COR %Predicted % 50   DLVA Predicted % 87   TLC L 3.81   TLC % Predicted % 80   RV % Predicted % 136     No results found for: "NITRICOXIDE"      Assessment & Plan:   COPD (chronic obstructive pulmonary disease) with chronic bronchitis (HCC) Acute COPD exacerbation.  We will treat with antibiotics and steroid burst.  Chest x-ray today is pending. Smoking cessation discussed in detail  Plan  Patient Instructions  Doxycycline '100mg'$  Twice daily , take with food, wear sunscreen  Mucinex DM Twice daily As needed  cough/congestion  Prednisone taper over next week.  Continue on Trelegy 1 puff daily, rinse after use  Albuterol inhaler /neb As needed   Chest xray today  Activity as tolerated.  Work on not smoking.  Continue on Oxygen 3l/m , goal is to keep O2 sats >88-90%.  Follow up with Dr. Elsworth Soho  2-3 months and As needed   Please contact office for sooner follow up if symptoms do not improve or worsen or seek emergency care      Chronic respiratory failure with hypoxia ([place]) Continue on oxygen to maintain O2 saturations greater than 88 to 90%  Tobacco abuse Smoking cessation     Tylicia Sherman,  NP 08/29/2022

## 2022-08-29 NOTE — Assessment & Plan Note (Signed)
Acute COPD exacerbation.  We will treat with antibiotics and steroid burst.  Chest x-ray today is pending. Smoking cessation discussed in detail  Plan  Patient Instructions  Doxycycline '100mg'$  Twice daily , take with food, wear sunscreen  Mucinex DM Twice daily As needed  cough/congestion  Prednisone taper over next week.  Continue on Trelegy 1 puff daily, rinse after use  Albuterol inhaler /neb As needed   Chest xray today  Activity as tolerated.  Work on not smoking.  Continue on Oxygen 3l/m , goal is to keep O2 sats >88-90%.  Follow up with Dr. Elsworth Soho  2-3 months and As needed   Please contact office for sooner follow up if symptoms do not improve or worsen or seek emergency care

## 2022-09-05 NOTE — Telephone Encounter (Signed)
Left voicemail for patient to call back to schedule appointment and mailed letter.

## 2022-09-14 DIAGNOSIS — J449 Chronic obstructive pulmonary disease, unspecified: Secondary | ICD-10-CM | POA: Diagnosis not present

## 2022-09-15 ENCOUNTER — Other Ambulatory Visit: Payer: Self-pay | Admitting: Primary Care

## 2022-09-24 ENCOUNTER — Telehealth: Payer: Self-pay | Admitting: Pulmonary Disease

## 2022-09-24 MED ORDER — PREDNISONE 10 MG PO TABS: 10.0000 mg | ORAL_TABLET | Freq: Every day | ORAL | 2 refills | Status: DC

## 2022-09-24 NOTE — Telephone Encounter (Signed)
Called and spoke with patient. Patient stated that she needed a refill of prednisone. Patient verified pharmacy. Rx for prednisone has been sent.   Nothing further needed.

## 2022-10-02 ENCOUNTER — Ambulatory Visit: Payer: Medicare HMO | Admitting: Internal Medicine

## 2022-10-10 ENCOUNTER — Other Ambulatory Visit: Payer: Self-pay | Admitting: Pulmonary Disease

## 2022-10-14 DIAGNOSIS — J449 Chronic obstructive pulmonary disease, unspecified: Secondary | ICD-10-CM | POA: Diagnosis not present

## 2022-10-24 NOTE — Progress Notes (Signed)
Subjective:    Patient ID: [name], female    DOB: 1956-04-28, 67 y.o.   MRN: [id number]      HPI Siddhi is here for  Chief Complaint  Patient presents with   Ear Pain    Bilateral ear pain (R>L)     B/l ear symptoms x 1 week -   burning and pounding in both ears x 1 week s- R > L.  Had this about one year ago as well-she is not sure what it was related to but she did receive medication and it went away.    It is worse in am  Occ cracking sound in ears.  He is not able to pop the ears.  She has chronically decreased hearing, but nothing new in the past week.  Medications and allergies reviewed with patient and updated if appropriate.  Current Outpatient Medications on File Prior to Visit  Medication Sig Dispense Refill   albuterol (PROVENTIL) (2.5 MG/3ML) 0.083% nebulizer solution Take 3 mLs (2.5 mg total) by nebulization every 6 (six) hours as needed for wheezing or shortness of breath. 75 mL 12   albuterol (VENTOLIN HFA) 108 (90 Base) MCG/ACT inhaler INHALE 1-2 PUFFS BY MOUTH EVERY 6 HOURS AS NEEDED FOR WHEEZE OR SHORTNESS OF BREATH 18 g 5   amLODipine (NORVASC) 5 MG tablet Take 1 tablet (5 mg total) by mouth daily. 90 tablet 3   atorvastatin (LIPITOR) 40 MG tablet TAKE 1 TABLET BY MOUTH EVERY DAY 90 tablet 3   carvedilol (COREG) 12.5 MG tablet Take 1 tablet (12.5 mg total) by mouth 2 (two) times daily with a meal. 180 tablet 3   cholecalciferol (VITAMIN D) 1000 units tablet Take 1,000 Units by mouth daily.     Dulaglutide (TRULICITY) 3.82 NK/5.3ZJ SOPN Inject 0.75 mg into the skin once a week. 6 mL 0   furosemide (LASIX) 40 MG tablet TAKE 1 TABLET BY MOUTH TWICE A DAY 180 tablet 1   isosorbide mononitrate (IMDUR) 60 MG 24 hr tablet Take 1.5 tablets (90 mg total) by mouth daily. 135 tablet 3   Melatonin 10 MG SUBL Place 1 each under the tongue as needed.     metFORMIN (GLUCOPHAGE-XR) 500 MG 24 hr tablet TAKE 1 TABLET BY MOUTH EVERY DAY WITH BREAKFAST 90 tablet 1    Multiple Vitamin (MULTIVITAMIN WITH MINERALS) TABS tablet Take 1 tablet by mouth daily.     predniSONE (DELTASONE) 10 MG tablet Take 1 tablet (10 mg total) by mouth daily with breakfast. 30 tablet 2   TRELEGY ELLIPTA 200-62.5-25 MCG/ACT AEPB TAKE 1 PUFF BY MOUTH EVERY DAY 60 each 4   vitamin B-12 (CYANOCOBALAMIN) 1000 MCG tablet Take 1,000 mcg by mouth daily.     vitamin C (ASCORBIC ACID) 500 MG tablet Take 500 mg by mouth daily.      Current Facility-Administered Medications on File Prior to Visit  Medication Dose Route Frequency Provider Last Rate Last Admin   nicotine (NICOTROL) 10 MG inhaler 1 continuous puffing  1 continuous puffing Inhalation PRN Rigoberto Noel, MD       regadenoson (LEXISCAN) injection SOLN 0.4 mg  0.4 mg Intravenous Once Fay Records, MD       technetium tetrofosmin (TC-MYOVIEW) injection 30 millicurie  30 millicurie Intravenous Once PRN Fay Records, MD        Review of Systems  Constitutional:  Positive for fatigue. Negative for fever.  HENT:  Positive for hearing loss (chronic), sneezing and  sore throat (chronic). Negative for congestion, ear pain and sinus pain.   Respiratory:  Positive for shortness of breath. Negative for cough and wheezing.        Objective:   Vitals:   10/25/22 1011  BP: (!) 106/58  Pulse: 75  Temp: 98.3 F (36.8 C)  SpO2: 95%   BP Readings from Last 3 Encounters:  10/25/22 (!) 106/58  08/29/22 (!) 108/58  07/20/22 131/70   Wt Readings from Last 3 Encounters:  10/25/22 185 lb (83.9 kg)  08/29/22 181 lb (82.1 kg)  07/10/22 183 lb (83 kg)   Body mass index is 34.96 kg/m.    Physical Exam Constitutional:      General: She is not in acute distress.    Appearance: Normal appearance. She is not ill-appearing.     Comments: On oxygen via nasal cannula  HENT:     Head: Normocephalic and atraumatic.     Comments: No tenderness at TMJ joints or posterior ears    Right Ear: Tympanic membrane, ear canal and external ear  normal. There is no impacted cerumen.     Left Ear: Tympanic membrane, ear canal and external ear normal. There is no impacted cerumen.     Mouth/Throat:     Mouth: Mucous membranes are moist.     Pharynx: No oropharyngeal exudate or posterior oropharyngeal erythema.  Eyes:     Conjunctiva/sclera: Conjunctivae normal.  Cardiovascular:     Rate and Rhythm: Normal rate and regular rhythm.  Pulmonary:     Effort: Pulmonary effort is normal. No respiratory distress.     Breath sounds: No wheezing or rales.     Comments: Diffusely decreased breath sounds Musculoskeletal:     Cervical back: Neck supple. No tenderness.  Lymphadenopathy:     Cervical: No cervical adenopathy.  Skin:    General: Skin is warm and dry.  Neurological:     Mental Status: She is alert.            Assessment & Plan:    See Problem List for Assessment and Plan of chronic medical problems.

## 2022-10-25 ENCOUNTER — Encounter: Payer: Self-pay | Admitting: Internal Medicine

## 2022-10-25 ENCOUNTER — Ambulatory Visit (INDEPENDENT_AMBULATORY_CARE_PROVIDER_SITE_OTHER): Payer: Medicare HMO | Admitting: Internal Medicine

## 2022-10-25 VITALS — BP 106/58 | HR 75 | Temp 98.3°F | Ht 61.0 in | Wt 185.0 lb

## 2022-10-25 DIAGNOSIS — H938X9 Other specified disorders of ear, unspecified ear: Secondary | ICD-10-CM

## 2022-10-25 DIAGNOSIS — H6993 Unspecified Eustachian tube disorder, bilateral: Secondary | ICD-10-CM

## 2022-10-25 DIAGNOSIS — H9203 Otalgia, bilateral: Secondary | ICD-10-CM

## 2022-10-25 MED ORDER — FLUTICASONE PROPIONATE 50 MCG/ACT NA SUSP: 2.0000 | Freq: Every day | NASAL | 6 refills | Status: DC

## 2022-10-25 NOTE — Assessment & Plan Note (Addendum)
Acute B/l ear symptoms-not able to pop them, burning and pounding-right ear is worse than left ear Ear exam is normal-no evidence of infection Possible eustachian tube dysfunction She has had this in the past, though her symptoms are somewhat atypical she does have difficulty unclogging her ears Start Flonase nasal spray 2 sprays each nostril daily Can use saline nasal spray on top of that to avoid dryness/epistaxis Call if no improvement

## 2022-10-25 NOTE — Patient Instructions (Addendum)
Medications changes include :   start flonase 2 sprays in each nostril daily -- use saline nasal spray     Return if symptoms worsen or fail to improve.     Eustachian Tube Dysfunction  Eustachian tube dysfunction refers to a condition in which a blockage develops in the narrow passage that connects the middle ear to the back of the nose (eustachian tube). The eustachian tube regulates air pressure in the middle ear by letting air move between the ear and nose. It also helps to drain fluid from the middle ear space. Eustachian tube dysfunction can affect one or both ears. When the eustachian tube does not function properly, air pressure, fluid, or both can build up in the middle ear. What are the causes? This condition occurs when the eustachian tube becomes blocked or cannot open normally. Common causes of this condition include: Ear infections. Colds and other infections that affect the nose, mouth, and throat (upper respiratory tract). Allergies. Irritation from cigarette smoke. Irritation from stomach acid coming up into the esophagus (gastroesophageal reflux). The esophagus is the part of the body that moves food from the mouth to the stomach. Sudden changes in air pressure, such as from descending in an airplane or scuba diving. Abnormal growths in the nose or throat, such as: Growths that line the nose (nasal polyps). Abnormal growth of cells (tumors). Enlarged tissue at the back of the throat (adenoids). What increases the risk? You are more likely to develop this condition if: You smoke. You are overweight. You are a child who has: Certain birth defects of the mouth, such as cleft palate. Large tonsils or adenoids. What are the signs or symptoms? Common symptoms of this condition include: A feeling of fullness in the ear. Ear pain. Clicking or popping noises in the ear. Ringing in the ear (tinnitus). Hearing loss. Loss of balance. Dizziness. Symptoms may  get worse when the air pressure around you changes, such as when you travel to an area of high elevation, fly on an airplane, or go scuba diving. How is this diagnosed? This condition may be diagnosed based on: Your symptoms. A physical exam of your ears, nose, and throat. Tests, such as those that measure: The movement of your eardrum. Your hearing (audiometry). How is this treated? Treatment depends on the cause and severity of your condition. In mild cases, you may relieve your symptoms by moving air into your ears. This is called "popping the ears." In more severe cases, or if you have symptoms of fluid in your ears, treatment may include: Medicines to relieve congestion (decongestants). Medicines that treat allergies (antihistamines). Nasal sprays or ear drops that contain medicines that reduce swelling (steroids). A procedure to drain the fluid in your eardrum. In this procedure, a small tube may be placed in the eardrum to: Drain the fluid. Restore the air in the middle ear space. A procedure to insert a balloon device through the nose to inflate the opening of the eustachian tube (balloon dilation). Follow these instructions at home: Lifestyle Do not do any of the following until your health care provider approves: Travel to high altitudes. Fly in airplanes. Work in a Pension scheme manager or room. Scuba dive. Do not use any products that contain nicotine or tobacco. These products include cigarettes, chewing tobacco, and vaping devices, such as e-cigarettes. If you need help quitting, ask your health care provider. Keep your ears dry. Wear fitted earplugs during showering and bathing. Dry your ears completely  after. General instructions Take over-the-counter and prescription medicines only as told by your health care provider. Use techniques to help pop your ears as recommended by your health care provider. These may include: Chewing gum. Yawning. Frequent, forceful  swallowing. Closing your mouth, holding your nose closed, and gently blowing as if you are trying to blow air out of your nose. Keep all follow-up visits. This is important. Contact a health care provider if: Your symptoms do not go away after treatment. Your symptoms come back after treatment. You are unable to pop your ears. You have: A fever. Pain in your ear. Pain in your head or neck. Fluid draining from your ear. Your hearing suddenly changes. You become very dizzy. You lose your balance. Get help right away if: You have a sudden, severe increase in any of your symptoms. Summary Eustachian tube dysfunction refers to a condition in which a blockage develops in the eustachian tube. It can be caused by ear infections, allergies, inhaled irritants, or abnormal growths in the nose or throat. Symptoms may include ear pain or fullness, hearing loss, or ringing in the ears. Mild cases are treated with techniques to unblock the ears, such as yawning or chewing gum. More severe cases are treated with medicines or procedures. This information is not intended to replace advice given to you by your health care provider. Make sure you discuss any questions you have with your health care provider. Document Revised: 12/18/2020 Document Reviewed: 12/18/2020 Elsevier Patient Education  [place].

## 2022-11-07 ENCOUNTER — Encounter (HOSPITAL_COMMUNITY): Payer: Self-pay | Admitting: Internal Medicine

## 2022-11-07 ENCOUNTER — Emergency Department (HOSPITAL_COMMUNITY): Payer: Medicare HMO

## 2022-11-07 ENCOUNTER — Other Ambulatory Visit: Payer: Self-pay

## 2022-11-07 ENCOUNTER — Inpatient Hospital Stay (HOSPITAL_COMMUNITY)
Admission: EM | Admit: 2022-11-07 | Discharge: 2022-11-09 | DRG: 190 | Disposition: A | Discharge status: Home / Self Care | Payer: Medicare HMO | Attending: Internal Medicine | Admitting: Internal Medicine

## 2022-11-07 DIAGNOSIS — Z1152 Encounter for screening for COVID-19: Secondary | ICD-10-CM

## 2022-11-07 DIAGNOSIS — D72829 Elevated white blood cell count, unspecified: Secondary | ICD-10-CM | POA: Diagnosis present

## 2022-11-07 DIAGNOSIS — J441 Chronic obstructive pulmonary disease with (acute) exacerbation: Principal | ICD-10-CM | POA: Diagnosis present

## 2022-11-07 DIAGNOSIS — E785 Hyperlipidemia, unspecified: Secondary | ICD-10-CM | POA: Diagnosis present

## 2022-11-07 DIAGNOSIS — J9621 Acute and chronic respiratory failure with hypoxia: Secondary | ICD-10-CM | POA: Diagnosis present

## 2022-11-07 DIAGNOSIS — Z7951 Long term (current) use of inhaled steroids: Secondary | ICD-10-CM

## 2022-11-07 DIAGNOSIS — Z951 Presence of aortocoronary bypass graft: Secondary | ICD-10-CM

## 2022-11-07 DIAGNOSIS — R062 Wheezing: Secondary | ICD-10-CM | POA: Diagnosis not present

## 2022-11-07 DIAGNOSIS — I10 Essential (primary) hypertension: Secondary | ICD-10-CM | POA: Diagnosis not present

## 2022-11-07 DIAGNOSIS — R0689 Other abnormalities of breathing: Secondary | ICD-10-CM

## 2022-11-07 DIAGNOSIS — I2510 Atherosclerotic heart disease of native coronary artery without angina pectoris: Secondary | ICD-10-CM | POA: Diagnosis present

## 2022-11-07 DIAGNOSIS — R069 Unspecified abnormalities of breathing: Secondary | ICD-10-CM | POA: Diagnosis not present

## 2022-11-07 DIAGNOSIS — E669 Obesity, unspecified: Secondary | ICD-10-CM | POA: Diagnosis present

## 2022-11-07 DIAGNOSIS — I110 Hypertensive heart disease with heart failure: Secondary | ICD-10-CM | POA: Diagnosis present

## 2022-11-07 DIAGNOSIS — Z7984 Long term (current) use of oral hypoglycemic drugs: Secondary | ICD-10-CM

## 2022-11-07 DIAGNOSIS — I5032 Chronic diastolic (congestive) heart failure: Secondary | ICD-10-CM | POA: Diagnosis present

## 2022-11-07 DIAGNOSIS — E1165 Type 2 diabetes mellitus with hyperglycemia: Secondary | ICD-10-CM

## 2022-11-07 DIAGNOSIS — Z823 Family history of stroke: Secondary | ICD-10-CM

## 2022-11-07 DIAGNOSIS — E119 Type 2 diabetes mellitus without complications: Secondary | ICD-10-CM

## 2022-11-07 DIAGNOSIS — Z808 Family history of malignant neoplasm of other organs or systems: Secondary | ICD-10-CM

## 2022-11-07 DIAGNOSIS — J9622 Acute and chronic respiratory failure with hypercapnia: Secondary | ICD-10-CM | POA: Diagnosis present

## 2022-11-07 DIAGNOSIS — J9602 Acute respiratory failure with hypercapnia: Secondary | ICD-10-CM | POA: Diagnosis not present

## 2022-11-07 DIAGNOSIS — Z66 Do not resuscitate: Secondary | ICD-10-CM | POA: Diagnosis present

## 2022-11-07 DIAGNOSIS — Z743 Need for continuous supervision: Secondary | ICD-10-CM | POA: Diagnosis not present

## 2022-11-07 DIAGNOSIS — Z8249 Family history of ischemic heart disease and other diseases of the circulatory system: Secondary | ICD-10-CM

## 2022-11-07 DIAGNOSIS — Z888 Allergy status to other drugs, medicaments and biological substances status: Secondary | ICD-10-CM

## 2022-11-07 DIAGNOSIS — K219 Gastro-esophageal reflux disease without esophagitis: Secondary | ICD-10-CM | POA: Diagnosis present

## 2022-11-07 DIAGNOSIS — Z79899 Other long term (current) drug therapy: Secondary | ICD-10-CM

## 2022-11-07 DIAGNOSIS — J984 Other disorders of lung: Secondary | ICD-10-CM | POA: Diagnosis present

## 2022-11-07 DIAGNOSIS — R0902 Hypoxemia: Secondary | ICD-10-CM | POA: Diagnosis not present

## 2022-11-07 DIAGNOSIS — Z801 Family history of malignant neoplasm of trachea, bronchus and lung: Secondary | ICD-10-CM

## 2022-11-07 DIAGNOSIS — R0602 Shortness of breath: Secondary | ICD-10-CM | POA: Diagnosis not present

## 2022-11-07 DIAGNOSIS — Z6833 Body mass index (BMI) 33.0-33.9, adult: Secondary | ICD-10-CM

## 2022-11-07 DIAGNOSIS — F17210 Nicotine dependence, cigarettes, uncomplicated: Secondary | ICD-10-CM | POA: Diagnosis present

## 2022-11-07 DIAGNOSIS — Z9981 Dependence on supplemental oxygen: Secondary | ICD-10-CM

## 2022-11-07 DIAGNOSIS — J4489 Other specified chronic obstructive pulmonary disease: Secondary | ICD-10-CM | POA: Diagnosis present

## 2022-11-07 DIAGNOSIS — Z7952 Long term (current) use of systemic steroids: Secondary | ICD-10-CM

## 2022-11-07 LAB — COMPREHENSIVE METABOLIC PANEL
ALT: 29 U/L (ref 0–44)
AST: 22 U/L (ref 15–41)
Albumin: 3.8 g/dL (ref 3.5–5.0)
Alkaline Phosphatase: 76 U/L (ref 38–126)
Anion gap: 8 (ref 5–15)
BUN: 8 mg/dL (ref 8–23)
CO2: 39 mmol/L — ABNORMAL HIGH (ref 22–32)
Calcium: 9 mg/dL (ref 8.9–10.3)
Chloride: 94 mmol/L — ABNORMAL LOW (ref 98–111)
Creatinine, Ser: 0.43 mg/dL — ABNORMAL LOW (ref 0.44–1.00)
GFR, Estimated: >60 mL/min (ref >60)
Glucose, Bld: 250 mg/dL — ABNORMAL HIGH (ref 70–99)
Potassium: 4.3 mmol/L (ref 3.5–5.1)
Sodium: 141 mmol/L (ref 135–145)
Total Bilirubin: 0.4 mg/dL (ref 0.3–1.2)
Total Protein: 6.8 g/dL (ref 6.5–8.1)

## 2022-11-07 LAB — I-STAT ARTERIAL BLOOD GAS, ED
Acid-Base Excess: 12 mmol/L — ABNORMAL HIGH (ref 0.0–2.0)
Bicarbonate: 42.3 mmol/L — ABNORMAL HIGH (ref 20.0–28.0)
Calcium, Ion: 1.18 mmol/L (ref 1.15–1.40)
HCT: 44 % (ref 36.0–46.0)
Hemoglobin: 15 g/dL (ref 12.0–15.0)
O2 Saturation: 91 %
Potassium: 4.1 mmol/L (ref 3.5–5.1)
Sodium: 137 mmol/L (ref 135–145)
TCO2: 45 mmol/L — ABNORMAL HIGH (ref 22–32)
pCO2 arterial: 81.9 mmHg (ref 32–48)
pH, Arterial: 7.321 — ABNORMAL LOW (ref 7.35–7.45)
pO2, Arterial: 69 mmHg — ABNORMAL LOW (ref 83–108)

## 2022-11-07 LAB — GLUCOSE, CAPILLARY: Glucose-Capillary: 216 mg/dL — ABNORMAL HIGH (ref 70–99)

## 2022-11-07 LAB — CBC
HCT: 47 % — ABNORMAL HIGH (ref 36.0–46.0)
Hemoglobin: 13.9 g/dL (ref 12.0–15.0)
MCH: 28.4 pg (ref 26.0–34.0)
MCHC: 29.6 g/dL — ABNORMAL LOW (ref 30.0–36.0)
MCV: 95.9 fL (ref 80.0–100.0)
Platelets: 223 10*3/uL (ref 150–400)
RBC: 4.9 MIL/uL (ref 3.87–5.11)
RDW: 12.7 % (ref 11.5–15.5)
WBC: 16.9 10*3/uL — ABNORMAL HIGH (ref 4.0–10.5)
nRBC: 0 % (ref 0.0–0.2)

## 2022-11-07 LAB — RESP PANEL BY RT-PCR (RSV, FLU A&B, COVID)  RVPGX2
Influenza A by PCR: NEGATIVE
Influenza B by PCR: NEGATIVE
Resp Syncytial Virus by PCR: NEGATIVE
SARS Coronavirus 2 by RT PCR: NEGATIVE

## 2022-11-07 LAB — BRAIN NATRIURETIC PEPTIDE: B Natriuretic Peptide: 89 pg/mL (ref 0.0–100.0)

## 2022-11-07 LAB — HIV ANTIBODY (ROUTINE TESTING W REFLEX): HIV Screen 4th Generation wRfx: NONREACTIVE

## 2022-11-07 LAB — CBG MONITORING, ED: Glucose-Capillary: 208 mg/dL — ABNORMAL HIGH (ref 70–99)

## 2022-11-07 MED ORDER — ACETAMINOPHEN 650 MG RE SUPP: 650.0000 mg | Freq: Four times a day (QID) | RECTAL | Status: DC | PRN

## 2022-11-07 MED ORDER — FLUTICASONE FUROATE-VILANTEROL 200-25 MCG/ACT IN AEPB
1.0000 | INHALATION_SPRAY | Freq: Every day | RESPIRATORY_TRACT | Status: DC
  Administered 2022-11-08 – 2022-11-09 (×2): 1 via RESPIRATORY_TRACT
  Filled 2022-11-07: qty 28

## 2022-11-07 MED ORDER — IPRATROPIUM BROMIDE 0.02 % IN SOLN
0.5000 mg | Freq: Four times a day (QID) | RESPIRATORY_TRACT | Status: DC
  Administered 2022-11-07 – 2022-11-08 (×3): 0.5 mg via RESPIRATORY_TRACT
  Filled 2022-11-07 (×3): qty 2.5

## 2022-11-07 MED ORDER — ALBUTEROL SULFATE (2.5 MG/3ML) 0.083% IN NEBU: 2.5000 mg | INHALATION_SOLUTION | RESPIRATORY_TRACT | Status: DC | PRN

## 2022-11-07 MED ORDER — ALBUTEROL SULFATE (2.5 MG/3ML) 0.083% IN NEBU
INHALATION_SOLUTION | RESPIRATORY_TRACT | Status: AC
  Administered 2022-11-07: 10 mg via RESPIRATORY_TRACT
  Filled 2022-11-07: qty 12

## 2022-11-07 MED ORDER — ATORVASTATIN CALCIUM 40 MG PO TABS
40.0000 mg | ORAL_TABLET | Freq: Every day | ORAL | Status: DC
  Administered 2022-11-08 – 2022-11-09 (×2): 40 mg via ORAL
  Filled 2022-11-07 (×2): qty 1

## 2022-11-07 MED ORDER — INSULIN ASPART 100 UNIT/ML IJ SOLN
0.0000 [IU] | Freq: Three times a day (TID) | INTRAMUSCULAR | Status: DC
  Administered 2022-11-07: 5 [IU] via SUBCUTANEOUS
  Administered 2022-11-08: 8 [IU] via SUBCUTANEOUS
  Administered 2022-11-08: 5 [IU] via SUBCUTANEOUS
  Administered 2022-11-08 – 2022-11-09 (×3): 3 [IU] via SUBCUTANEOUS

## 2022-11-07 MED ORDER — SODIUM CHLORIDE 0.9% FLUSH
3.0000 mL | Freq: Two times a day (BID) | INTRAVENOUS | Status: DC
  Administered 2022-11-07 – 2022-11-09 (×5): 3 mL via INTRAVENOUS

## 2022-11-07 MED ORDER — ISOSORBIDE MONONITRATE ER 60 MG PO TB24
90.0000 mg | ORAL_TABLET | Freq: Every day | ORAL | Status: DC
  Filled 2022-11-07: qty 1

## 2022-11-07 MED ORDER — ALBUTEROL (5 MG/ML) CONTINUOUS INHALATION SOLN: 10.0000 mg/h | INHALATION_SOLUTION | Freq: Once | RESPIRATORY_TRACT | Status: DC

## 2022-11-07 MED ORDER — POLYETHYLENE GLYCOL 3350 17 G PO PACK: 17.0000 g | PACK | Freq: Every day | ORAL | Status: DC | PRN

## 2022-11-07 MED ORDER — ACETAMINOPHEN 325 MG PO TABS
650.0000 mg | ORAL_TABLET | Freq: Four times a day (QID) | ORAL | Status: DC | PRN
  Administered 2022-11-08: 650 mg via ORAL
  Filled 2022-11-07: qty 2

## 2022-11-07 MED ORDER — AMLODIPINE BESYLATE 5 MG PO TABS
5.0000 mg | ORAL_TABLET | Freq: Every day | ORAL | Status: DC
  Administered 2022-11-08: 5 mg via ORAL
  Filled 2022-11-07: qty 1

## 2022-11-07 MED ORDER — UMECLIDINIUM BROMIDE 62.5 MCG/ACT IN AEPB
1.0000 | INHALATION_SPRAY | Freq: Every day | RESPIRATORY_TRACT | Status: DC
  Administered 2022-11-08 – 2022-11-09 (×2): 1 via RESPIRATORY_TRACT
  Filled 2022-11-07: qty 7

## 2022-11-07 MED ORDER — CARVEDILOL 12.5 MG PO TABS
12.5000 mg | ORAL_TABLET | Freq: Two times a day (BID) | ORAL | Status: DC
  Administered 2022-11-07 – 2022-11-09 (×4): 12.5 mg via ORAL
  Filled 2022-11-07 (×4): qty 1

## 2022-11-07 MED ORDER — FUROSEMIDE 20 MG PO TABS: 40.0000 mg | ORAL_TABLET | Freq: Two times a day (BID) | ORAL | Status: DC

## 2022-11-07 MED ORDER — PREDNISONE 50 MG PO TABS
50.0000 mg | ORAL_TABLET | Freq: Every day | ORAL | Status: DC
  Administered 2022-11-08: 50 mg via ORAL
  Filled 2022-11-07: qty 1

## 2022-11-07 MED ORDER — ALBUTEROL SULFATE (2.5 MG/3ML) 0.083% IN NEBU: 10.0000 mg | INHALATION_SOLUTION | Freq: Once | RESPIRATORY_TRACT | Status: AC

## 2022-11-07 MED ORDER — ENOXAPARIN SODIUM 40 MG/0.4ML IJ SOSY
40.0000 mg | PREFILLED_SYRINGE | INTRAMUSCULAR | Status: DC
  Administered 2022-11-07 – 2022-11-08 (×2): 40 mg via SUBCUTANEOUS
  Filled 2022-11-07 (×2): qty 0.4

## 2022-11-07 MED ORDER — MELATONIN 5 MG PO TABS: 10.0000 mg | ORAL_TABLET | ORAL | Status: DC | PRN

## 2022-11-07 NOTE — ED Notes (Signed)
ED TO INPATIENT HANDOFF REPORT  ED Nurse Name and Phone #: Ashley Akin, RN [phone]  S Name/Age/Gender [name] 67 y.o. female Room/Bed: 003C/003C  Code Status   Code Status: Full Code  Home/SNF/Other Home Patient oriented to: self, place, time, and situation Is this baseline? Yes   Triage Complete: Triage complete  Chief Complaint COPD exacerbation ([place]) [J44.1]  Triage Note Pt BIB [facility] from home for SOB since 3am, used inhaler and neb at home, no relief. EMS reports diminished lung sounds, 2g of mag, '125mg'$  solumedrol, 2 duonebs administered, minimal improvement per patient. HR 106, 76% on 3L, improved to 96% on NRB with neb running. Patient anxious and breathing is labored.   Allergies Allergies  Allergen Reactions   Augmentin [Amoxicillin-Pot Clavulanate] Nausea And Vomiting   Chantix [Varenicline] Nausea And Vomiting    Level of Care/Admitting Diagnosis ED Disposition     ED Disposition  Admit   Condition  --   [place]: [place] [[id number]]  Level of Care: Progressive [102]  Admit to Progressive based on following criteria: RESPIRATORY PROBLEMS hypoxemic/hypercapnic respiratory failure that is responsive to NIPPV (BiPAP) or High Flow Nasal Cannula (6-80 lpm). Frequent assessment/intervention, no > Q2 hrs < Q4 hrs, to maintain oxygenation and pulmonary hygiene.  May place patient in observation at [facility] or [place] if equivalent level of care is available:: No  Covid Evaluation: Confirmed COVID Negative  Diagnosis: COPD exacerbation [facility]) [[id number]]  Admitting Physician: Marcelyn Bruins [[id number]]  Attending Physician: Marcelyn Bruins [[id number]]          B Medical/Surgery History Past Medical History:  Diagnosis Date   Bronchitis    CAD (coronary artery disease)    a. 02/2002: CABG x3 with LIMA to LAD, RIMA to RCA, and SVG to OM   CHF (congestive heart failure) ([place])    COPD (chronic  obstructive pulmonary disease) ([place])    Diabetes ([place])    Headache    Heart disease    Hyperlipidemia    Hypertension    Hypertensive emergency 07/01/2013   Impaired glucose tolerance 08/20/2014   Sinusitis    Past Surgical History:  Procedure Laterality Date   CORONARY ARTERY BYPASS GRAFT  2003   Triple bypass   ESOPHAGOGASTRODUODENOSCOPY Left 06/13/2014   Procedure: ESOPHAGOGASTRODUODENOSCOPY (EGD);  Surgeon: Juanita Craver, MD;  Location: [facility] ENDOSCOPY;  Service: Endoscopy;  Laterality: Left;   RIGID ESOPHAGOSCOPY N/A 06/12/2014   Procedure: RIGID ESOPHAGOSCOPY WITH FOREIGN BODY REMOVAL;  Surgeon: Ascencion Dike, MD;  Location: [place];  Service: ENT;  Laterality: N/A;     A IV Location/Drains/Wounds Patient Lines/Drains/Airways Status     Active Line/Drains/Airways     Name Placement date Placement time Site Days   Peripheral IV 01/28/20 Left Antecubital 01/28/20  0759  Antecubital  1014   Peripheral IV 06/19/21 22 G 1" Right Hand 06/19/21  1046  Hand  506   Peripheral IV 11/07/22 18 G Left Antecubital 11/07/22  1157  Antecubital  less than 1            Intake/Output Last 24 hours  Intake/Output Summary (Last 24 hours) at 11/07/2022 2005 Last data filed at 11/07/2022 1557 Gross per 24 hour  Intake 10 ml  Output --  Net 10 ml    Labs/Imaging Results for orders placed or performed during the hospital encounter of 11/07/22 (from the past 48 hour(s))  Resp panel by RT-PCR (RSV, Flu A&B, Covid) Anterior Nasal Swab  Status: None   Collection Time: 11/07/22 11:58 AM   Specimen: Anterior Nasal Swab  Result Value Ref Range   SARS Coronavirus 2 by RT PCR NEGATIVE NEGATIVE    Comment: (NOTE) SARS-CoV-2 target nucleic acids are NOT DETECTED.  The SARS-CoV-2 RNA is generally detectable in upper respiratory specimens during the acute phase of infection. The lowest concentration of SARS-CoV-2 viral copies this assay can detect is 138 copies/mL. A negative result does not  preclude SARS-Cov-2 infection and should not be used as the sole basis for treatment or other patient management decisions. A negative result may occur with  improper specimen collection/handling, submission of specimen other than nasopharyngeal swab, presence of viral mutation(s) within the areas targeted by this assay, and inadequate number of viral copies(<138 copies/mL). A negative result must be combined with clinical observations, patient history, and epidemiological information. The expected result is Negative.  Fact Sheet for Patients:  EntrepreneurPulse.com.au  Fact Sheet for Healthcare Providers:  IncredibleEmployment.be  This test is no t yet approved or cleared by the [place] FDA and  has been authorized for detection and/or diagnosis of SARS-CoV-2 by FDA under an Emergency Use Authorization (EUA). This EUA will remain  in effect (meaning this test can be used) for the duration of the COVID-19 declaration under Section 564(b)(1) of the Act, 21 U.S.C.section 360bbb-3(b)(1), unless the authorization is terminated  or revoked sooner.       Influenza A by PCR NEGATIVE NEGATIVE   Influenza B by PCR NEGATIVE NEGATIVE    Comment: (NOTE) The Xpert Xpress SARS-CoV-2/FLU/RSV plus assay is intended as an aid in the diagnosis of influenza from Nasopharyngeal swab specimens and should not be used as a sole basis for treatment. Nasal washings and aspirates are unacceptable for Xpert Xpress SARS-CoV-2/FLU/RSV testing.  Fact Sheet for Patients: EntrepreneurPulse.com.au  Fact Sheet for Healthcare Providers: IncredibleEmployment.be  This test is not yet approved or cleared by the [place] FDA and has been authorized for detection and/or diagnosis of SARS-CoV-2 by FDA under an Emergency Use Authorization (EUA). This EUA will remain in effect (meaning this test can be used) for the duration of  the COVID-19 declaration under Section 564(b)(1) of the Act, 21 U.S.C. section 360bbb-3(b)(1), unless the authorization is terminated or revoked.     Resp Syncytial Virus by PCR NEGATIVE NEGATIVE    Comment: (NOTE) Fact Sheet for Patients: EntrepreneurPulse.com.au  Fact Sheet for Healthcare Providers: IncredibleEmployment.be  This test is not yet approved or cleared by the [place] FDA and has been authorized for detection and/or diagnosis of SARS-CoV-2 by FDA under an Emergency Use Authorization (EUA). This EUA will remain in effect (meaning this test can be used) for the duration of the COVID-19 declaration under Section 564(b)(1) of the Act, 21 U.S.C. section 360bbb-3(b)(1), unless the authorization is terminated or revoked.  Performed at [facility], [place] [address], [place], [place] [address]   Comprehensive metabolic panel     Status: Abnormal   Collection Time: 11/07/22 12:14 PM  Result Value Ref Range   Sodium 141 135 - 145 mmol/L   Potassium 4.3 3.5 - 5.1 mmol/L   Chloride 94 (L) 98 - 111 mmol/L   CO2 39 (H) 22 - 32 mmol/L   Glucose, Bld 250 (H) 70 - 99 mg/dL    Comment: Glucose reference range applies only to samples taken after fasting for at least 8 hours.   BUN 8 8 - 23 mg/dL   Creatinine, Ser 0.43 (L) 0.44 -  1.00 mg/dL   Calcium 9.0 8.9 - 10.3 mg/dL   Total Protein 6.8 6.5 - 8.1 g/dL   Albumin 3.8 3.5 - 5.0 g/dL   AST 22 15 - 41 U/L   ALT 29 0 - 44 U/L   Alkaline Phosphatase 76 38 - 126 U/L   Total Bilirubin 0.4 0.3 - 1.2 mg/dL   GFR, Estimated >60 >60 mL/min    Comment: (NOTE) Calculated using the CKD-EPI Creatinine Equation (2021)    Anion gap 8 5 - 15    Comment: Performed at [facility] [address], [place], [place] [address]  CBC     Status: Abnormal   Collection Time: 11/07/22 12:14 PM  Result Value Ref Range   WBC 16.9 (H) 4.0 - 10.5 K/uL   RBC 4.90 3.87 - 5.11 MIL/uL   Hemoglobin 13.9  12.0 - 15.0 g/dL   HCT 47.0 (H) 36.0 - 46.0 %   MCV 95.9 80.0 - 100.0 fL   MCH 28.4 26.0 - 34.0 pg   MCHC 29.6 (L) 30.0 - 36.0 g/dL   RDW 12.7 11.5 - 15.5 %   Platelets 223 150 - 400 K/uL   nRBC 0.0 0.0 - 0.2 %    Comment: Performed at [facility], [place] [address] [place], [place] [address]  Brain natriuretic peptide     Status: None   Collection Time: 11/07/22 12:14 PM  Result Value Ref Range   B Natriuretic Peptide 89.0 0.0 - 100.0 pg/mL    Comment: Performed at [facility] [address] [place], [place] [address]  I-Stat arterial blood gas, ED [facility], [facility], [facility])     Status: Abnormal   Collection Time: 11/07/22 12:28 PM  Result Value Ref Range   pH, Arterial 7.321 (L) 7.35 - 7.45   pCO2 arterial 81.9 (HH) 32 - 48 mmHg   pO2, Arterial 69 (L) 83 - 108 mmHg   Bicarbonate 42.3 (H) 20.0 - 28.0 mmol/L   TCO2 45 (H) 22 - 32 mmol/L   O2 Saturation 91 %   Acid-Base Excess 12.0 (H) 0.0 - 2.0 mmol/L   Sodium 137 135 - 145 mmol/L   Potassium 4.1 3.5 - 5.1 mmol/L   Calcium, Ion 1.18 1.15 - 1.40 mmol/L   HCT 44.0 36.0 - 46.0 %   Hemoglobin 15.0 12.0 - 15.0 g/dL   Collection site RADIAL, ALLEN'S TEST ACCEPTABLE    Drawn by RT    Sample type ARTERIAL    Comment NOTIFIED PHYSICIAN   HIV Antibody (routine testing w rflx)     Status: None   Collection Time: 11/07/22  3:32 PM  Result Value Ref Range   HIV Screen 4th Generation wRfx Non Reactive Non Reactive    Comment: Performed at [facility], [address] [place], [place] [address]  CBG monitoring, ED     Status: Abnormal   Collection Time: 11/07/22  6:58 PM  Result Value Ref Range   Glucose-Capillary 208 (H) 70 - 99 mg/dL    Comment: Glucose reference range applies only to samples taken after fasting for at least 8 hours.   DG Chest Port 1 View  Result Date: 11/07/2022 CLINICAL DATA:  Dyspnea.  Shortness of breath EXAM: PORTABLE CHEST 1 VIEW COMPARISON:  08/29/2022 and older FINDINGS: Enlarged  cardiopericardial silhouette with postop changes. Clips and sternal wires. Hyperinflation. No pneumothorax or effusion. Tiny right effusion versus pleural thickening. Mild interstitial prominence. Overlapping cardiac leads. IMPRESSION: Postop  chest with enlarged heart. Hyperinflation with interstitial changes similar to previous. Tiny right effusion versus pleural thickening. Electronically Signed   By: Jill Side M.D.   On: 11/07/2022 12:44    Pending Labs Unresulted Labs (From admission, onward)     Start     Ordered   11/14/22 0500  Creatinine, serum  (enoxaparin (LOVENOX)    CrCl >/= 30 ml/min)  Weekly,   R     Comments: while on enoxaparin therapy    11/07/22 1438   11/08/22 0500  Comprehensive metabolic panel  Tomorrow morning,   R        11/07/22 1438   11/08/22 0500  CBC  Tomorrow morning,   R        11/07/22 1438   11/07/22 1434  Respiratory (~20 pathogens) panel by PCR  (COPD / Pneumonia / Cellulitis / Lower Extremity Wound)  Add-on,   AD        11/07/22 1438            Vitals/Pain Today's Vitals   11/07/22 1845 11/07/22 1900 11/07/22 1943 11/07/22 2004  BP:    (!) 133/99  Pulse: 79 78  73  Resp:    18  Temp:    98.1 F (36.7 C)  TempSrc:    Oral  SpO2: 96% 95% 96% 96%    Isolation Precautions Droplet precaution  Medications Medications  isosorbide mononitrate (IMDUR) 24 hr tablet 90 mg (has no administration in time range)  furosemide (LASIX) tablet 40 mg (has no administration in time range)  carvedilol (COREG) tablet 12.5 mg (12.5 mg Oral Given 11/07/22 1900)  atorvastatin (LIPITOR) tablet 40 mg (has no administration in time range)  amLODipine (NORVASC) tablet 5 mg (has no administration in time range)  melatonin tablet 10 mg (has no administration in time range)  fluticasone furoate-vilanterol (BREO ELLIPTA) 200-25 MCG/ACT 1 puff (has no administration in time range)    And  umeclidinium bromide (INCRUSE ELLIPTA) 62.5 MCG/ACT 1 puff (has no  administration in time range)  predniSONE (DELTASONE) tablet 50 mg (has no administration in time range)  ipratropium (ATROVENT) nebulizer solution 0.5 mg (0.5 mg Nebulization Given 11/07/22 1942)  albuterol (PROVENTIL) (2.5 MG/3ML) 0.083% nebulizer solution 2.5 mg (has no administration in time range)  enoxaparin (LOVENOX) injection 40 mg (40 mg Subcutaneous Given 11/07/22 1524)  sodium chloride flush (NS) 0.9 % injection 3 mL (3 mLs Intravenous Given 11/07/22 1557)  acetaminophen (TYLENOL) tablet 650 mg (has no administration in time range)    Or  acetaminophen (TYLENOL) suppository 650 mg (has no administration in time range)  polyethylene glycol (MIRALAX / GLYCOLAX) packet 17 g (has no administration in time range)  insulin aspart (novoLOG) injection 0-15 Units (5 Units Subcutaneous Given 11/07/22 1902)  albuterol (PROVENTIL) (2.5 MG/3ML) 0.083% nebulizer solution 10 mg (10 mg Nebulization Given 11/07/22 1221)    Mobility walks     Focused Assessments Pulmonary Assessment Handoff:  Lung sounds: Bilateral Breath Sounds: Diminished L Breath Sounds: Diminished R Breath Sounds: Diminished O2 Device: Nasal Cannula O2 Flow Rate (L/min): 3 L/min    R Recommendations: See Admitting Provider Note  Report given to:   Additional Notes: Patient appears to be doing better after bipap trial. Work of breathing is decreased and is tolerating her home oxygen of 3L much better.

## 2022-11-07 NOTE — Progress Notes (Addendum)
Pt placed on Bipap by RT per MD. Pt tolerating well at this time, RN at bedside, vitals stable, MD at bedside, RT will monitor.    11/07/22 1205  Therapy Vitals  Pulse Rate (!) 101  Resp (!) 25  MEWS Score/Color  MEWS Score 2  MEWS Score Color Yellow  Oxygen Therapy/Pulse Ox  O2 Device (S)  Bi-PAP  FiO2 (%) 40 %  SpO2 95 %

## 2022-11-07 NOTE — ED Triage Notes (Signed)
Pt BIB [facility] from home for SOB since 3am, used inhaler and neb at home, no relief. EMS reports diminished lung sounds, 2g of mag, '125mg'$  solumedrol, 2 duonebs administered, minimal improvement per patient. HR 106, 76% on 3L, improved to 96% on NRB with neb running. Patient anxious and breathing is labored.

## 2022-11-07 NOTE — ED Provider Notes (Signed)
[facility] EMERGENCY DEPARTMENT Provider Note   CSN: [id number] Arrival date & time: 11/07/22  1153     History  Chief Complaint  Patient presents with   Shortness of Breath    [name] is a 67 y.o. female.   Shortness of Breath    Patient presents to the ED for evaluation of acute shortness of breath.  Patient has a history of bronchitis sinusitis, hyperlipidemia, CHF, COPD on chronic home oxygen, coronary artery disease status post bypass.  Patient presents to the ED with complaints of acute shortness of breath that started earlier this morning.  Patient was using her inhalers at home without relief.  She denies any trouble with chest pain.  She has not noticed any fevers.  EMS arrived and noted her oxygen saturation was 76% on 3 L.  She was dilated on a nonrebreather.  Patient was also given 2 g of magnesium 105 mg of Solu-Medrol and 2 DuoNeb's.  She has not noticed much improvement.  Home Medications Prior to Admission medications   Medication Sig Start Date End Date Taking? Authorizing Provider  albuterol (PROVENTIL) (2.5 MG/3ML) 0.083% nebulizer solution Take 3 mLs (2.5 mg total) by nebulization every 6 (six) hours as needed for wheezing or shortness of breath. 07/17/22   Rigoberto Noel, MD  albuterol (VENTOLIN HFA) 108 (90 Base) MCG/ACT inhaler INHALE 1-2 PUFFS BY MOUTH EVERY 6 HOURS AS NEEDED FOR WHEEZE OR SHORTNESS OF BREATH 12/03/21   Rigoberto Noel, MD  amLODipine (NORVASC) 5 MG tablet Take 1 tablet (5 mg total) by mouth daily. 05/21/22   Jerline Pain, MD  atorvastatin (LIPITOR) 40 MG tablet TAKE 1 TABLET BY MOUTH EVERY DAY 05/07/22   Biagio Borg, MD  carvedilol (COREG) 12.5 MG tablet Take 1 tablet (12.5 mg total) by mouth 2 (two) times daily with a meal. 06/17/22   Jerline Pain, MD  cholecalciferol (VITAMIN D) 1000 units tablet Take 1,000 Units by mouth daily.    [provider]  Dulaglutide (TRULICITY) 3.22 GU/5.4YH SOPN Inject 0.75 mg  into the skin once a week. 01/23/22   Biagio Borg, MD  fluticasone (FLONASE) 50 MCG/ACT nasal spray Place 2 sprays into both nostrils daily. 10/25/22   Binnie Rail, MD  furosemide (LASIX) 40 MG tablet TAKE 1 TABLET BY MOUTH TWICE A DAY 04/02/22   Biagio Borg, MD  isosorbide mononitrate (IMDUR) 60 MG 24 hr tablet Take 1.5 tablets (90 mg total) by mouth daily. 05/23/22   Jerline Pain, MD  Melatonin 10 MG SUBL Place 1 each under the tongue as needed.    [provider]  metFORMIN (GLUCOPHAGE-XR) 500 MG 24 hr tablet TAKE 1 TABLET BY MOUTH EVERY DAY WITH BREAKFAST 08/29/22   Biagio Borg, MD  Multiple Vitamin (MULTIVITAMIN WITH MINERALS) TABS tablet Take 1 tablet by mouth daily.    [provider]  predniSONE (DELTASONE) 10 MG tablet Take 1 tablet (10 mg total) by mouth daily with breakfast. 09/24/22   Rigoberto Noel, MD  TRELEGY ELLIPTA 200-62.5-25 MCG/ACT AEPB TAKE 1 PUFF BY MOUTH EVERY DAY 09/16/22   Martyn Ehrich, NP  vitamin B-12 (CYANOCOBALAMIN) 1000 MCG tablet Take 1,000 mcg by mouth daily.    [provider]  vitamin C (ASCORBIC ACID) 500 MG tablet Take 500 mg by mouth daily.     [provider]      Allergies    Augmentin [amoxicillin-pot clavulanate]  Review of Systems   Review of Systems  Respiratory:  Positive for shortness of breath.     Physical Exam Updated Vital Signs BP 137/61   Pulse 68   Temp 97.7 F (36.5 C) (Oral)   Resp (!) 23   SpO2 96%  Physical Exam Vitals and nursing note reviewed.  Constitutional:      Appearance: She is well-developed. She is ill-appearing and diaphoretic.  HENT:     Head: Normocephalic and atraumatic.     Right Ear: External ear normal.     Left Ear: External ear normal.  Eyes:     General: No scleral icterus.       Right eye: No discharge.        Left eye: No discharge.     Conjunctiva/sclera: Conjunctivae normal.  Neck:     Trachea: No tracheal deviation.  Cardiovascular:     Rate and  Rhythm: Normal rate and regular rhythm.  Pulmonary:     Effort: Tachypnea, accessory muscle usage and respiratory distress present.     Breath sounds: No stridor. Decreased breath sounds and wheezing present. No rales.  Abdominal:     General: Bowel sounds are normal. There is no distension.     Palpations: Abdomen is soft.     Tenderness: There is no abdominal tenderness. There is no guarding or rebound.  Musculoskeletal:        General: No tenderness or deformity.     Cervical back: Neck supple.     Right lower leg: No edema.     Left lower leg: No edema.  Skin:    General: Skin is warm.     Findings: No rash.  Neurological:     General: No focal deficit present.     Mental Status: She is alert.     Cranial Nerves: No cranial nerve deficit, dysarthria or facial asymmetry.     Sensory: No sensory deficit.     Motor: No abnormal muscle tone or seizure activity.     Coordination: Coordination normal.  Psychiatric:        Mood and Affect: Mood normal.     ED Results / Procedures / Treatments   Labs (all labs ordered are listed, but only abnormal results are displayed) Labs Reviewed  COMPREHENSIVE METABOLIC PANEL - Abnormal; Notable for the following components:      Result Value   Chloride 94 (*)    CO2 39 (*)    Glucose, Bld 250 (*)    Creatinine, Ser 0.43 (*)    All other components within normal limits  CBC - Abnormal; Notable for the following components:   WBC 16.9 (*)    HCT 47.0 (*)    MCHC 29.6 (*)    All other components within normal limits  I-STAT ARTERIAL BLOOD GAS, ED - Abnormal; Notable for the following components:   pH, Arterial 7.321 (*)    pCO2 arterial 81.9 (*)    pO2, Arterial 69 (*)    Bicarbonate 42.3 (*)    TCO2 45 (*)    Acid-Base Excess 12.0 (*)    All other components within normal limits  RESP PANEL BY RT-PCR (RSV, FLU A&B, COVID)  RVPGX2  RESPIRATORY PANEL BY PCR  BRAIN NATRIURETIC PEPTIDE  HIV ANTIBODY (ROUTINE TESTING W REFLEX)     EKG EKG Interpretation  Date/Time:  Thursday November 07 2022 11:58:01 EST Ventricular Rate:  101 PR Interval:  171 QRS Duration: 87 QT Interval:  336 QTC Calculation: 436 R  Axis:   78 Text Interpretation: Sinus tachycardia Probable left atrial enlargement Anteroseptal infarct, old Since last tracing rate faster Confirmed by Dorie Rank [phone]) on 11/07/2022 11:59:59 AM  Radiology DG Chest Port 1 View  Result Date: 11/07/2022 CLINICAL DATA:  Dyspnea.  Shortness of breath EXAM: PORTABLE CHEST 1 VIEW COMPARISON:  08/29/2022 and older FINDINGS: Enlarged cardiopericardial silhouette with postop changes. Clips and sternal wires. Hyperinflation. No pneumothorax or effusion. Tiny right effusion versus pleural thickening. Mild interstitial prominence. Overlapping cardiac leads. IMPRESSION: Postop chest with enlarged heart. Hyperinflation with interstitial changes similar to previous. Tiny right effusion versus pleural thickening. Electronically Signed   By: Jill Side M.D.   On: 11/07/2022 12:44    Procedures .Critical Care  Performed by: Dorie Rank, MD Authorized by: Dorie Rank, MD   Critical care provider statement:    Critical care time (minutes):  45   Critical care was time spent personally by me on the following activities:  Development of treatment plan with patient or surrogate, discussions with consultants, evaluation of patient's response to treatment, examination of patient, ordering and review of laboratory studies, ordering and review of radiographic studies, ordering and performing treatments and interventions, pulse oximetry, re-evaluation of patient's condition and review of old charts     Medications Ordered in ED Medications  isosorbide mononitrate (IMDUR) 24 hr tablet 90 mg (has no administration in time range)  furosemide (LASIX) tablet 40 mg (has no administration in time range)  carvedilol (COREG) tablet 12.5 mg (has no administration in time range)  atorvastatin  (LIPITOR) tablet 40 mg (has no administration in time range)  amLODipine (NORVASC) tablet 5 mg (has no administration in time range)  melatonin tablet 10 mg (has no administration in time range)  fluticasone furoate-vilanterol (BREO ELLIPTA) 200-25 MCG/ACT 1 puff (has no administration in time range)    And  umeclidinium bromide (INCRUSE ELLIPTA) 62.5 MCG/ACT 1 puff (has no administration in time range)  predniSONE (DELTASONE) tablet 50 mg (has no administration in time range)  ipratropium (ATROVENT) nebulizer solution 0.5 mg (has no administration in time range)  albuterol (PROVENTIL) (2.5 MG/3ML) 0.083% nebulizer solution 2.5 mg (has no administration in time range)  enoxaparin (LOVENOX) injection 40 mg (has no administration in time range)  sodium chloride flush (NS) 0.9 % injection 3 mL (has no administration in time range)  acetaminophen (TYLENOL) tablet 650 mg (has no administration in time range)    Or  acetaminophen (TYLENOL) suppository 650 mg (has no administration in time range)  polyethylene glycol (MIRALAX / GLYCOLAX) packet 17 g (has no administration in time range)  albuterol (PROVENTIL) (2.5 MG/3ML) 0.083% nebulizer solution 10 mg (10 mg Nebulization Given 11/07/22 1221)    ED Course/ Medical Decision Making/ A&P Clinical Course as of 11/07/22 1505  Thu Nov 07, 2022  1239 Patient states she is feeling somewhat better after BiPAP.  No longer diaphoretic.,  No retractions noted [JK]  1310 I-Stat arterial blood gas, ED ([facility], [facility], [facility])(!!) ABG does show component of respiratory acidosis [JK]  1310 CBC(!) Leukocytosis noted [JK]  1405 Resp panel by RT-PCR (RSV, Flu A&B, Covid) Anterior Nasal Swab Covid flu negative [JK]  1406 DG Chest Port 1 View Chest x-ray with interstitial changes.  Similar compared to previous [JK]  1410 Brain natriuretic peptide BnP normal [JK]  1416 Patient breathing more easily.  Will try to see if we take her off BiPAP.  I will consult the medical  service for admission [JK]  Clinical Course User Index [JK] Dorie Rank, MD                              Medical Decision Making Problems Addressed: COPD exacerbation [facility]): acute illness or injury that poses a threat to life or bodily functions Hypercapnia: acute illness or injury that poses a threat to life or bodily functions  Amount and/or Complexity of Data Reviewed Labs: ordered. Decision-making details documented in ED Course. Radiology: ordered and independent interpretation performed. Decision-making details documented in ED Course. Discussion of management or test interpretation with external provider(s): Discussed with Dr. Trilby Drummer regarding admission  Risk Prescription drug management. Decision regarding hospitalization. Risk Details: Discussed with Dr. Trilby Drummer   Presented to the ED for acute shortness of breath.  Patient had notable wheezing respiratory distress on initial evaluation.  She was started on BiPAP after already receiving albuterol, steroids and magnesium for EMS.  Patient had significant improvement in her work of breathing with those treatments.  Patient was able to to come off of BiPAP and is breathing more easily now.  Patient's laboratory test do not suggest any component of acute CHF.  No signs of pneumonia on chest x-ray.  She is negative for COVID flu and RSV.  Patient will be admitted to the hospital for further treatment and evaluation.        Final Clinical Impression(s) / ED Diagnoses Final diagnoses:  COPD exacerbation ([place])  Hypercapnia    Rx / DC Orders ED Discharge Orders     None         Dorie Rank, MD 11/07/22 1507

## 2022-11-07 NOTE — H&P (Signed)
History and Physical   [name] TKP:[id number] DOB: Apr 07, 1956 DOA: 11/07/2022  PCP: Biagio Borg, MD   Patient coming from: Home  Chief Complaint: Shortness of breath  HPI: [name] is a 67 y.o. female with medical history significant of COPD, chronic respiratory failure with hypoxia, CAD status post CABG, hypertension, hyperlipidemia, GERD, back pain, polycythemia, diabetes, diastolic CHF presenting with shortness of breath.  Patient is reporting starting yesterday and worsening overnight.  She tried her home inhaler and nebulizers without relief.  She called EMS who administered magnesium, Solu-Medrol, DuoNeb x 2 and route with some improvement.  She was found initially to be saturating in the upper 70s on her home 3 L which improved to 96% on a nonrebreather.  Subsequently she was put on BiPAP due to elevated pCO2 on ABG.  She denies fevers, chills, chest pain, abdominal pain, constipation, diarrhea, nausea, vomiting.  ED Course: Vital signs in the ED significant for heart rate in the 60s to 100s, respiratory rate in the 20s, blood pressure in the 517G to 017C systolic, as above initially on nonrebreather at 10 L and then subsequently placed on BiPAP which was recently weaned back to nasal cannula.  Lab workup showed CMP with chloride 94, bicarb stable at 39, glucose 250.  CBC with leukocytosis to 16.9.  BMP normal.  Respiratory panel for flu COVID RSV negative.  ABG with pH of baseline pCO2 of 66.  Chest x-ray showed postoperative chest with cardiomegaly.  Hyperinflated lungs with similar interstitial changes to previous.  Tiny pleural effusion versus pleural thickening on right.  Patient received additional albuterol nebulization in the ED in addition to EMS interventions and BiPAP.   7.321, pCO2 81.9 with previous review of Systems: As per HPI otherwise all other systems reviewed and are negative.  Past Medical History:  Diagnosis Date   Bronchitis    CAD (coronary artery  disease)    a. 02/2002: CABG x3 with LIMA to LAD, RIMA to RCA, and SVG to OM   CHF (congestive heart failure) ([place])    COPD (chronic obstructive pulmonary disease) ([place])    Diabetes ([place])    Headache    Heart disease    Hyperlipidemia    Hypertension    Hypertensive emergency 07/01/2013   Impaired glucose tolerance 08/20/2014   Sinusitis     Past Surgical History:  Procedure Laterality Date   CORONARY ARTERY BYPASS GRAFT  2003   Triple bypass   ESOPHAGOGASTRODUODENOSCOPY Left 06/13/2014   Procedure: ESOPHAGOGASTRODUODENOSCOPY (EGD);  Surgeon: Juanita Craver, MD;  Location[facility];  Service: Endoscopy;  Laterality: Left;   RIGID ESOPHAGOSCOPY N/A 06/12/2014   Procedure: RIGID ESOPHAGOSCOPY WITH FOREIGN BODY REMOVAL;  Surgeon: Ascencion Dike, MD;  Location: [facility] OR;  Service: ENT;  Laterality: N/A;    Social History  reports that she has been smoking cigarettes. She started smoking about 50 years ago. She has a 23.50 pack-year smoking history. She has never used smokeless tobacco. She reports current alcohol use of about 5.0 - 6.0 standard drinks of alcohol per week. She reports that she does not currently use drugs after having used the following drugs: Marijuana.  Allergies  Allergen Reactions   Augmentin [Amoxicillin-Pot Clavulanate] Nausea And Vomiting    Family History  Problem Relation Age of Onset   Lung cancer Paternal Uncle    Heart disease Mother    Brain cancer Mother    Cancer Maternal Grandmother        colon  Stroke Other    Cancer Other        x 4 aunts   Lung cancer Maternal Aunt    Suicidality Brother   Reviewed on admission  Prior to Admission medications   Medication Sig Start Date End Date Taking? Authorizing Provider  albuterol (PROVENTIL) (2.5 MG/3ML) 0.083% nebulizer solution Take 3 mLs (2.5 mg total) by nebulization every 6 (six) hours as needed for wheezing or shortness of breath. 07/17/22   Rigoberto Noel, MD  albuterol (VENTOLIN HFA) 108 (90 Base)  MCG/ACT inhaler INHALE 1-2 PUFFS BY MOUTH EVERY 6 HOURS AS NEEDED FOR WHEEZE OR SHORTNESS OF BREATH 12/03/21   Rigoberto Noel, MD  amLODipine (NORVASC) 5 MG tablet Take 1 tablet (5 mg total) by mouth daily. 05/21/22   Jerline Pain, MD  atorvastatin (LIPITOR) 40 MG tablet TAKE 1 TABLET BY MOUTH EVERY DAY 05/07/22   Biagio Borg, MD  carvedilol (COREG) 12.5 MG tablet Take 1 tablet (12.5 mg total) by mouth 2 (two) times daily with a meal. 06/17/22   Jerline Pain, MD  cholecalciferol (VITAMIN D) 1000 units tablet Take 1,000 Units by mouth daily.    [provider]  Dulaglutide (TRULICITY) 1.77 LT/9.0ZE SOPN Inject 0.75 mg into the skin once a week. 01/23/22   Biagio Borg, MD  fluticasone (FLONASE) 50 MCG/ACT nasal spray Place 2 sprays into both nostrils daily. 10/25/22   Binnie Rail, MD  furosemide (LASIX) 40 MG tablet TAKE 1 TABLET BY MOUTH TWICE A DAY 04/02/22   Biagio Borg, MD  isosorbide mononitrate (IMDUR) 60 MG 24 hr tablet Take 1.5 tablets (90 mg total) by mouth daily. 05/23/22   Jerline Pain, MD  Melatonin 10 MG SUBL Place 1 each under the tongue as needed.    [provider]  metFORMIN (GLUCOPHAGE-XR) 500 MG 24 hr tablet TAKE 1 TABLET BY MOUTH EVERY DAY WITH BREAKFAST 08/29/22   Biagio Borg, MD  Multiple Vitamin (MULTIVITAMIN WITH MINERALS) TABS tablet Take 1 tablet by mouth daily.    [provider]  predniSONE (DELTASONE) 10 MG tablet Take 1 tablet (10 mg total) by mouth daily with breakfast. 09/24/22   Rigoberto Noel, MD  TRELEGY ELLIPTA 200-62.5-25 MCG/ACT AEPB TAKE 1 PUFF BY MOUTH EVERY DAY 09/16/22   Martyn Ehrich, NP  vitamin B-12 (CYANOCOBALAMIN) 1000 MCG tablet Take 1,000 mcg by mouth daily.    [provider]  vitamin C (ASCORBIC ACID) 500 MG tablet Take 500 mg by mouth daily.     [provider]    Physical Exam: Vitals:   11/07/22 1215 11/07/22 1230 11/07/22 1300 11/07/22 1330  BP: 137/61     Pulse: 91 88 78 68  Resp: (!) 30  19 (!) 25 (!) 23  Temp:      TempSrc:      SpO2: 94% 96% 94% 96%    Physical Exam Constitutional:      General: She is not in acute distress.    Appearance: Normal appearance.  HENT:     Head: Normocephalic and atraumatic.     Mouth/Throat:     Mouth: Mucous membranes are moist.     Pharynx: Oropharynx is clear.  Eyes:     Extraocular Movements: Extraocular movements intact.     Pupils: Pupils are equal, round, and reactive to light.  Cardiovascular:     Rate and Rhythm: Normal rate and regular rhythm.     Pulses: Normal pulses.  Heart sounds: Normal heart sounds.  Pulmonary:     Effort: Pulmonary effort is normal. No respiratory distress.     Breath sounds: Decreased breath sounds and wheezing present.  Abdominal:     General: Bowel sounds are normal. There is no distension.     Palpations: Abdomen is soft.     Tenderness: There is no abdominal tenderness.  Musculoskeletal:        General: No swelling or deformity.  Skin:    General: Skin is warm and dry.  Neurological:     General: No focal deficit present.     Mental Status: Mental status is at baseline.     Labs on Admission: I have personally reviewed following labs and imaging studies  CBC: Recent Labs  Lab 11/07/22 1214 11/07/22 1228  WBC 16.9*  --   HGB 13.9 15.0  HCT 47.0* 44.0  MCV 95.9  --   PLT 223  --     Basic Metabolic Panel: Recent Labs  Lab 11/07/22 1214 11/07/22 1228  NA 141 137  K 4.3 4.1  CL 94*  --   CO2 39*  --   GLUCOSE 250*  --   BUN 8  --   CREATININE 0.43*  --   CALCIUM 9.0  --     GFR: Estimated Creatinine Clearance: 67.9 mL/min (A) (by C-G formula based on SCr of 0.43 mg/dL (L)).  Liver Function Tests: Recent Labs  Lab 11/07/22 1214  AST 22  ALT 29  ALKPHOS 76  BILITOT 0.4  PROT 6.8  ALBUMIN 3.8    Urine analysis:    Component Value Date/Time   COLORURINE AMBER (A) 07/20/2022 0639   APPEARANCEUR HAZY (A) 07/20/2022 0639   LABSPEC 1.041 (H)  07/20/2022 0639   PHURINE 5.0 07/20/2022 0639   GLUCOSEU NEGATIVE 07/20/2022 0639   GLUCOSEU NEGATIVE 06/28/2022 1553   HGBUR NEGATIVE 07/20/2022 0639   BILIRUBINUR NEGATIVE 07/20/2022 0639   KETONESUR NEGATIVE 07/20/2022 0639   PROTEINUR 30 (A) 07/20/2022 0639   UROBILINOGEN 1.0 06/28/2022 1553   NITRITE NEGATIVE 07/20/2022 0639   LEUKOCYTESUR NEGATIVE 07/20/2022 0639    Radiological Exams on Admission: DG Chest Port 1 View  Result Date: 11/07/2022 CLINICAL DATA:  Dyspnea.  Shortness of breath EXAM: PORTABLE CHEST 1 VIEW COMPARISON:  08/29/2022 and older FINDINGS: Enlarged cardiopericardial silhouette with postop changes. Clips and sternal wires. Hyperinflation. No pneumothorax or effusion. Tiny right effusion versus pleural thickening. Mild interstitial prominence. Overlapping cardiac leads. IMPRESSION: Postop chest with enlarged heart. Hyperinflation with interstitial changes similar to previous. Tiny right effusion versus pleural thickening. Electronically Signed   By: Jill Side M.D.   On: 11/07/2022 12:44    EKG: Independently reviewed.  Sinus tachycardia at 101 bpm.  Minimal baseline artifact.  Assessment/Plan Active Problems:   CAD (coronary artery disease)   Essential hypertension   COPD (chronic obstructive pulmonary disease) with chronic bronchitis (HCC)   Status post coronary artery bypass grafting   Acute on chronic respiratory failure with hypoxia (HCC)   GERD (gastroesophageal reflux disease)   Diabetes (HCC)   HLD (hyperlipidemia)   COPD exacerbation (HCC)   COPD exacerbation Acute on chronic respiratory failure with hypoxia and hypercarbia > Patient presenting with worsening shortness of breath starting this morning not responding to home inhaler and nebulizer treatments. > Minimal improvement initially with magnesium, Solu-Medrol, DuoNebs and route via EMS.  > Initially saturating 70% on home 3 L improved to 96% on nonrebreather at around 10 L.  Transition  to  BiPAP for hypercarbia with pCO2 at 81.9 from baseline around 66 per chart review. > Recently weaned off BiPAP back onto nasal cannula. > Unclear etiology, negative for flu COVID and RSV.  Has been taking her medications.  Does have leukocytosis but no evidence of infection on chest x-ray and no other symptoms of infection such as fever.  Possibly reactive leukocytosis. > Patient had somewhat brittle COPD on chronic steroids and oxygen.  But does not have frequent hospitalizations.  Will check for full respiratory viral panel for possible viral etiology. - Monitor on progressive unit with as needed BiPAP for now - Continue supplemental oxygen, wean as tolerated - Daily steroids, if fails to improve may benefit from increased dose given her chronic steroid therapy - Scheduled Atrovent - As needed albuterol - Replace home Trelegy with formulary Breo and increase - Follow-up RVP - Trend fever curve and WBC  Hyperlipidemia CAD > Status post CABG - Continue home carvedilol, Imdur - Continue home atorvastatin  Diabetes - SSI  Hypertension - Continue home amlodipine, carvedilol, Lasix  Chronic diastolic CHF > Last echo was in August of this year with EF 55-60%, G2 DD, normal RV function. - Continue home Lasix, carvedilol  DVT prophylaxis: Lovenox Code Status:   DNR/DNI, Otherwise full scope, confirmed on admission Family Communication:  Son present at bedside Disposition Plan:   Patient is from:  Home  Anticipated DC to:  Home  Anticipated DC date:  1 - 3 days  Anticipated DC barriers: None  Consults called:  None Admission status:  Observation, progressive  Severity of Illness: The appropriate patient status for this patient is OBSERVATION. Observation status is judged to be reasonable and necessary in order to provide the required intensity of service to ensure the patient's safety. The patient's presenting symptoms, physical exam findings, and initial radiographic and laboratory  data in the context of their medical condition is felt to place them at decreased risk for further clinical deterioration. Furthermore, it is anticipated that the patient will be medically stable for discharge from the hospital within 2 midnights of admission.    Marcelyn Bruins MD [facility]  How to contact the [facility] Attending or Consulting provider [place] or covering provider during after hours [place], for this patient?   Check the care team in [facility] and look for a) attending/consulting [facility] provider listed and b) the [facility] team listed Log into [email or web address] and use [place]'s universal password to access. If you do not have the password, please contact the hospital operator. Locate the [facility] provider you are looking for under [facility] and page to a number that you can be directly reached. If you still have difficulty reaching the provider, please page the [facility] (Director on Call) for the Hospitalists listed on amion for assistance.  11/07/2022, 2:41 PM

## 2022-11-07 NOTE — ED Notes (Signed)
Got patient undressed into a gown on the monitor did EKG shown to Dr Tomi Bamberger

## 2022-11-07 NOTE — ED Notes (Signed)
Bipap removed, placed on 3L [place] which is baseline

## 2022-11-07 NOTE — Progress Notes (Signed)
RT spoke to Pt and Pt would like to be placed back on bipap later this evening when she goes to bed.

## 2022-11-08 DIAGNOSIS — K219 Gastro-esophageal reflux disease without esophagitis: Secondary | ICD-10-CM | POA: Diagnosis not present

## 2022-11-08 DIAGNOSIS — Z8249 Family history of ischemic heart disease and other diseases of the circulatory system: Secondary | ICD-10-CM | POA: Diagnosis not present

## 2022-11-08 DIAGNOSIS — J9621 Acute and chronic respiratory failure with hypoxia: Secondary | ICD-10-CM | POA: Diagnosis not present

## 2022-11-08 DIAGNOSIS — Z1152 Encounter for screening for COVID-19: Secondary | ICD-10-CM | POA: Diagnosis not present

## 2022-11-08 DIAGNOSIS — Z951 Presence of aortocoronary bypass graft: Secondary | ICD-10-CM | POA: Diagnosis not present

## 2022-11-08 DIAGNOSIS — J9622 Acute and chronic respiratory failure with hypercapnia: Secondary | ICD-10-CM | POA: Diagnosis not present

## 2022-11-08 DIAGNOSIS — Z888 Allergy status to other drugs, medicaments and biological substances status: Secondary | ICD-10-CM | POA: Diagnosis not present

## 2022-11-08 DIAGNOSIS — J441 Chronic obstructive pulmonary disease with (acute) exacerbation: Secondary | ICD-10-CM | POA: Diagnosis not present

## 2022-11-08 DIAGNOSIS — Z808 Family history of malignant neoplasm of other organs or systems: Secondary | ICD-10-CM | POA: Diagnosis not present

## 2022-11-08 DIAGNOSIS — E785 Hyperlipidemia, unspecified: Secondary | ICD-10-CM | POA: Diagnosis not present

## 2022-11-08 DIAGNOSIS — F17210 Nicotine dependence, cigarettes, uncomplicated: Secondary | ICD-10-CM | POA: Diagnosis not present

## 2022-11-08 DIAGNOSIS — I110 Hypertensive heart disease with heart failure: Secondary | ICD-10-CM | POA: Diagnosis not present

## 2022-11-08 DIAGNOSIS — Z6833 Body mass index (BMI) 33.0-33.9, adult: Secondary | ICD-10-CM | POA: Diagnosis not present

## 2022-11-08 DIAGNOSIS — Z801 Family history of malignant neoplasm of trachea, bronchus and lung: Secondary | ICD-10-CM | POA: Diagnosis not present

## 2022-11-08 DIAGNOSIS — J984 Other disorders of lung: Secondary | ICD-10-CM | POA: Diagnosis not present

## 2022-11-08 DIAGNOSIS — Z9981 Dependence on supplemental oxygen: Secondary | ICD-10-CM | POA: Diagnosis not present

## 2022-11-08 DIAGNOSIS — E119 Type 2 diabetes mellitus without complications: Secondary | ICD-10-CM | POA: Diagnosis not present

## 2022-11-08 DIAGNOSIS — D72829 Elevated white blood cell count, unspecified: Secondary | ICD-10-CM | POA: Diagnosis not present

## 2022-11-08 DIAGNOSIS — E669 Obesity, unspecified: Secondary | ICD-10-CM | POA: Diagnosis not present

## 2022-11-08 DIAGNOSIS — Z7984 Long term (current) use of oral hypoglycemic drugs: Secondary | ICD-10-CM | POA: Diagnosis not present

## 2022-11-08 DIAGNOSIS — Z823 Family history of stroke: Secondary | ICD-10-CM | POA: Diagnosis not present

## 2022-11-08 DIAGNOSIS — I2510 Atherosclerotic heart disease of native coronary artery without angina pectoris: Secondary | ICD-10-CM | POA: Diagnosis not present

## 2022-11-08 DIAGNOSIS — I5032 Chronic diastolic (congestive) heart failure: Secondary | ICD-10-CM | POA: Diagnosis not present

## 2022-11-08 DIAGNOSIS — Z66 Do not resuscitate: Secondary | ICD-10-CM | POA: Diagnosis not present

## 2022-11-08 LAB — COMPREHENSIVE METABOLIC PANEL
ALT: 27 U/L (ref 0–44)
AST: 19 U/L (ref 15–41)
Albumin: 3.5 g/dL (ref 3.5–5.0)
Alkaline Phosphatase: 66 U/L (ref 38–126)
Anion gap: 8 (ref 5–15)
BUN: 13 mg/dL (ref 8–23)
CO2: 41 mmol/L — ABNORMAL HIGH (ref 22–32)
Calcium: 9.5 mg/dL (ref 8.9–10.3)
Chloride: 93 mmol/L — ABNORMAL LOW (ref 98–111)
Creatinine, Ser: 0.53 mg/dL (ref 0.44–1.00)
GFR, Estimated: >60 mL/min (ref >60)
Glucose, Bld: 222 mg/dL — ABNORMAL HIGH (ref 70–99)
Potassium: 5.1 mmol/L (ref 3.5–5.1)
Sodium: 142 mmol/L (ref 135–145)
Total Bilirubin: 0.3 mg/dL (ref 0.3–1.2)
Total Protein: 6.4 g/dL — ABNORMAL LOW (ref 6.5–8.1)

## 2022-11-08 LAB — GLUCOSE, CAPILLARY
Glucose-Capillary: 172 mg/dL — ABNORMAL HIGH (ref 70–99)
Glucose-Capillary: 263 mg/dL — ABNORMAL HIGH (ref 70–99)
Glucose-Capillary: 236 mg/dL — ABNORMAL HIGH (ref 70–99)
Glucose-Capillary: 230 mg/dL — ABNORMAL HIGH (ref 70–99)

## 2022-11-08 LAB — CBC
HCT: 45.2 % (ref 36.0–46.0)
Hemoglobin: 13.4 g/dL (ref 12.0–15.0)
MCH: 28.1 pg (ref 26.0–34.0)
MCHC: 29.6 g/dL — ABNORMAL LOW (ref 30.0–36.0)
MCV: 94.8 fL (ref 80.0–100.0)
Platelets: 179 10*3/uL (ref 150–400)
RBC: 4.77 MIL/uL (ref 3.87–5.11)
RDW: 12.6 % (ref 11.5–15.5)
WBC: 9.6 10*3/uL (ref 4.0–10.5)
nRBC: 0 % (ref 0.0–0.2)

## 2022-11-08 MED ORDER — INSULIN GLARGINE-YFGN 100 UNIT/ML ~~LOC~~ SOLN
4.0000 [IU] | Freq: Every day | SUBCUTANEOUS | Status: DC
  Administered 2022-11-08: 4 [IU] via SUBCUTANEOUS
  Filled 2022-11-08 (×3): qty 0.04

## 2022-11-08 MED ORDER — INSULIN GLARGINE-YFGN 100 UNIT/ML ~~LOC~~ SOLN: 4.0000 [IU] | Freq: Every day | SUBCUTANEOUS | Status: DC

## 2022-11-08 MED ORDER — METHYLPREDNISOLONE SODIUM SUCC 40 MG IJ SOLR
40.0000 mg | Freq: Two times a day (BID) | INTRAMUSCULAR | Status: AC
  Administered 2022-11-08 – 2022-11-09 (×2): 40 mg via INTRAVENOUS
  Filled 2022-11-08 (×2): qty 1

## 2022-11-08 MED ORDER — PREDNISONE 50 MG PO TABS: 50.0000 mg | ORAL_TABLET | Freq: Every day | ORAL | Status: DC

## 2022-11-08 MED ORDER — INSULIN GLARGINE-YFGN 100 UNIT/ML ~~LOC~~ SOLN: 3.0000 [IU] | Freq: Every day | SUBCUTANEOUS | Status: DC

## 2022-11-08 MED ORDER — INSULIN ASPART 100 UNIT/ML IJ SOLN
5.0000 [IU] | Freq: Three times a day (TID) | INTRAMUSCULAR | Status: DC
  Administered 2022-11-09 (×2): 5 [IU] via SUBCUTANEOUS

## 2022-11-08 NOTE — Progress Notes (Signed)
Progress Note   Patient: [name] [id number]:[id number] DOB: 1956-03-31 DOA: 11/07/2022     0 DOS: the patient was seen and examined on 11/08/2022   Brief hospital course: 67 year old female with PMH of HTN, HL, COPD on chronic prednisone 10 mg daily, CHRF on 3L [place] at baseline, CAD s/p CABG, Diastolic Heart Failure, and GERD who presented to the ED on 1/18 with acute dyspnea due to an acute COPD exacerbation.  She briefly required BIPAP.  1/19: Oxygen status is back to baseline.  [name] remains significantly short of breath.  I will give her one more day of aggressive treatment and plan to discharge her tomorrow.  Assessment and Plan: Acute on Chronic Hypoxic Respiratory Failure, resolved: - Oxygen status is at baseline 3L [place].  Acute COPD Exacerbation: - Continue Breo. - This is Day 2 out of 7 of Prednisone 50 mg daily.  Follow up with Pulmonologist outpatient to taper down to regular dosing.   - Check am cortisol. - Plan is to discharge tomorrow.  CAD s/p CABG: - Continue Aspirin, BB, and Statin.  Diabetes Mellitus Type 2: Glucose is running high with steroids. - Start Lantus 4 units nightly and Novolog 5 units TID. - Lispro SSI with POC glucose ACHS.  Hypertension: - Continue home meds.  Chronic Grade 2 Diastolic Heart Failure: - Continue home meds.  Obesity: BMI is 33.53 kg/m2. - Counseled on weight loss through diet and lifestyle modifications.  Code Status DNR/DNI   Subjective: [name] states she still feels significantly short of breath. She denies fevers, chills, dizziness, or chest pain. Her oxygen requirements are at baseline 3L . We will keep her one more day and discharge tomorrow.  Physical Exam: Vitals:   11/08/22 0738 11/08/22 0747 11/08/22 0752 11/08/22 1111  BP: (!) 115/52   (!) 126/51  Pulse: 70 75  81  Resp: '19 19  20  '$ Temp: 97.9 F (36.6 C)     TempSrc: Oral     SpO2: 98% 100% 100% 91%  Weight:      Height:       Physical  Exam Constitutional:      General: She is in acute distress.     Appearance: She is obese.  HENT:     Head: Normocephalic and atraumatic.  Eyes:     Extraocular Movements: Extraocular movements intact.     Pupils: Pupils are equal, round, and reactive to light.  Neck:     Thyroid: No thyromegaly.     Vascular: No JVD.  Cardiovascular:     Rate and Rhythm: Normal rate and regular rhythm.  Pulmonary:     Effort: Pulmonary effort is normal.     Comments: Mild expiratory wheezing bilaterally Chest:     Chest wall: No tenderness.  Abdominal:     General: Bowel sounds are normal.     Palpations: Abdomen is soft.     Tenderness: There is no abdominal tenderness.  Musculoskeletal:     Cervical back: Normal range of motion and neck supple.  Skin:    General: Skin is warm and dry.     Capillary Refill: Capillary refill takes less than 2 seconds.  Neurological:     General: No focal deficit present.  Psychiatric:        Mood and Affect: Mood normal.     Data Reviewed: DG Chest Port 1 View CLINICAL DATA:  Dyspnea.  Shortness of breath  EXAM: PORTABLE CHEST 1 VIEW  COMPARISON:  08/29/2022 and older  FINDINGS: Enlarged cardiopericardial silhouette with postop changes. Clips and sternal wires. Hyperinflation. No pneumothorax or effusion. Tiny right effusion versus pleural thickening. Mild interstitial prominence. Overlapping cardiac leads.  IMPRESSION: Postop chest with enlarged heart. Hyperinflation with interstitial changes similar to previous. Tiny right effusion versus pleural thickening.  Electronically Signed   By: Jill Side M.D.   On: 11/07/2022 12:44    Family Communication: I spoke with family member at bedside.  Disposition: Status is: Inpatient Remains inpatient appropriate because: requires nebulizer treatments and steroids.  Planned Discharge Destination: Home   Time spent: 60 minutes  Author: George Hugh, MD 11/08/2022 6:25 PM  For on call  review [email or web address].

## 2022-11-08 NOTE — Inpatient Diabetes Management (Signed)
Inpatient Diabetes Program Recommendations  AACE/ADA: New Consensus Statement on Inpatient Glycemic Control (2015)  Target Ranges:  Prepandial:   less than 140 mg/dL      Peak postprandial:   less than 180 mg/dL (1-2 hours)      Critically ill patients:  140 - 180 mg/dL    Latest Reference Range & Units 11/07/22 18:58 11/07/22 21:08 11/08/22 06:26 11/08/22 11:07  Glucose-Capillary 70 - 99 mg/dL 208 (H)  5 units Novolog  216 (H) 172 (H)  3 units Novolog  263 (H)  8 units Novolog   (H): Data is abnormally high   Home DM Meds: Metformin 500 mg daily (Not Taking Trulicity)    Current: Novolog 0-15 units TID (started yest at 7pm)      Recent A1c was 7% (Sept 2023)    MD- Note pt getting Prednisone 50 mg Daily  CBG 263 at Lunch today  Please consider starting Novolog Meal Coverage:  Novolog 4 units TID with meals HOLD if pt NPO HOLD if pt eats <50% meals    --Will follow patient during hospitalization--  Wyn Quaker RN, MSN, [place] Diabetes Coordinator Inpatient Glycemic Control Team Team Pager: [phone] (8a-5p)

## 2022-11-08 NOTE — Progress Notes (Signed)
Patient was not in any respiratory distress, I asked if she wore a cpap or bipap at home and she said no.

## 2022-11-08 NOTE — TOC Progression Note (Signed)
Transition of Care [facility]) - Progression Note    Patient Details  Name: [name] MRN: [id number] Date of Birth: [birth year]  Transition of Care [facility]) CM/SW Contact  Zenon Mayo, RN Phone Number: 11/08/2022, 9:09 AM  Clinical Narrative:    from home alone, sob, baseline is 3 liters oxygen, was on bipap temporarily.  TOC following.        Expected Discharge Plan and Services                                               Social Determinants of Health (SDOH) Interventions SDOH Screenings   Food Insecurity: No Food Insecurity (11/08/2022)  Housing: Low Risk  (11/08/2022)  Transportation Needs: No Transportation Needs (11/08/2022)  Utilities: Not At Risk (11/08/2022)  Alcohol Screen: Low Risk  (06/20/2022)  Depression (PHQ2-9): Low Risk  (06/20/2022)  Financial Resource Strain: Low Risk  (06/20/2022)  Physical Activity: Inactive (06/20/2022)  Social Connections: Socially Integrated (06/20/2022)  Stress: No Stress Concern Present (06/20/2022)  Tobacco Use: High Risk (11/07/2022)    Readmission Risk Interventions     No data to display

## 2022-11-08 NOTE — Progress Notes (Signed)
Pt's IV site kept bleeding even after dressing change. Took IV out and took one attempt to insert another IV. Pt refused to let me stick again and wants only IV Team to place IV. IV Team consult placed.

## 2022-11-08 NOTE — Plan of Care (Signed)
  Problem: Activity: Goal: Ability to tolerate increased activity will improve Outcome: Progressing   Problem: Respiratory: Goal: Ability to maintain a clear airway will improve Outcome: Progressing   Problem: Coping: Goal: Ability to adjust to condition or change in health will improve Outcome: Progressing

## 2022-11-09 DIAGNOSIS — J441 Chronic obstructive pulmonary disease with (acute) exacerbation: Secondary | ICD-10-CM | POA: Diagnosis not present

## 2022-11-09 LAB — GLUCOSE, CAPILLARY
Glucose-Capillary: 195 mg/dL — ABNORMAL HIGH (ref 70–99)
Glucose-Capillary: 182 mg/dL — ABNORMAL HIGH (ref 70–99)

## 2022-11-09 LAB — CBC
HCT: 44.1 % (ref 36.0–46.0)
Hemoglobin: 13.3 g/dL (ref 12.0–15.0)
MCH: 28.2 pg (ref 26.0–34.0)
MCHC: 30.2 g/dL (ref 30.0–36.0)
MCV: 93.6 fL (ref 80.0–100.0)
Platelets: 162 10*3/uL (ref 150–400)
RBC: 4.71 MIL/uL (ref 3.87–5.11)
RDW: 12.6 % (ref 11.5–15.5)
WBC: 10.8 10*3/uL — ABNORMAL HIGH (ref 4.0–10.5)
nRBC: 0 % (ref 0.0–0.2)

## 2022-11-09 LAB — BASIC METABOLIC PANEL
Anion gap: 8 (ref 5–15)
BUN: 11 mg/dL (ref 8–23)
CO2: 41 mmol/L — ABNORMAL HIGH (ref 22–32)
Calcium: 9.3 mg/dL (ref 8.9–10.3)
Chloride: 94 mmol/L — ABNORMAL LOW (ref 98–111)
Creatinine, Ser: 0.37 mg/dL — ABNORMAL LOW (ref 0.44–1.00)
GFR, Estimated: >60 mL/min (ref >60)
Glucose, Bld: 166 mg/dL — ABNORMAL HIGH (ref 70–99)
Potassium: 4.9 mmol/L (ref 3.5–5.1)
Sodium: 143 mmol/L (ref 135–145)

## 2022-11-09 MED ORDER — PREDNISONE 20 MG PO TABS: 40.0000 mg | ORAL_TABLET | Freq: Every day | ORAL | 0 refills | Status: DC

## 2022-11-09 MED ORDER — METHYLPREDNISOLONE SODIUM SUCC 40 MG IJ SOLR
40.0000 mg | Freq: Once | INTRAMUSCULAR | Status: DC
  Filled 2022-11-09: qty 1

## 2022-11-09 MED ORDER — FUROSEMIDE 20 MG PO TABS: 20.0000 mg | ORAL_TABLET | Freq: Every day | ORAL | 0 refills | Status: DC

## 2022-11-09 MED ORDER — PREDNISONE 10 MG PO TABS: 10.0000 mg | ORAL_TABLET | Freq: Every day | ORAL | 2 refills | Status: DC

## 2022-11-09 NOTE — TOC Transition Note (Signed)
Transition of Care [facility]) - CM/SW Discharge Note   Patient Details  Name: [name] MRN: [id number] Date of Birth: [birth year]  Transition of Care [facility]) CM/SW Contact:  Carles Collet, RN Phone Number: 11/09/2022, 11:16 AM   Clinical Narrative:      Patient to DC to home. Per chart review has home oxygen 3L.  No new HH or DME needs.  No TOC needs identified for DC       Patient Goals and CMS Choice      Discharge Placement                         Discharge Plan and Services Additional resources added to the After Visit Summary for                                       Social Determinants of Health (SDOH) Interventions SDOH Screenings   Food Insecurity: No Food Insecurity (11/08/2022)  Housing: Low Risk  (11/08/2022)  Transportation Needs: No Transportation Needs (11/08/2022)  Utilities: Not At Risk (11/08/2022)  Alcohol Screen: Low Risk  (06/20/2022)  Depression (PHQ2-9): Low Risk  (06/20/2022)  Financial Resource Strain: Low Risk  (06/20/2022)  Physical Activity: Inactive (06/20/2022)  Social Connections: Socially Integrated (06/20/2022)  Stress: No Stress Concern Present (06/20/2022)  Tobacco Use: High Risk (11/07/2022)     Readmission Risk Interventions     No data to display

## 2022-11-09 NOTE — Progress Notes (Signed)
   11/09/22 1000  Mobility  Activity Ambulated independently in hallway  Level of Assistance Independent  Assistive Device None  Distance Ambulated (ft) 300 ft  Activity Response Tolerated well  Mobility Referral Yes  $Mobility charge 1 Mobility   Mobility Specialist Progress Note  Pre-Mobility: 98% SpO2(4L)  During Mobility: 86-94% SpO2(3L) Post-Mobility: 92% SpO2(2L)   Pt was in bed and agreeable. X1 standing break d/t SOB but recovered after pursed lip breathing. Turned O2 down to 2L per RN request. Left in bed w/ all needs met and call bell in reach.  Lucious Groves Mobility Specialist  Please contact via SecureChat or Rehab office at [phone]

## 2022-11-10 LAB — RESPIRATORY PANEL BY PCR

## 2022-11-10 NOTE — Discharge Summary (Signed)
Physician Discharge Summary   Patient: [name] MRN: [id number] DOB: 04-08-1956  Admit date:     11/07/2022  Discharge date: 11/09/2022  Discharge Physician: George Hugh   PCP: Biagio Borg, MD   Recommendations at discharge:    Please take Prednisone 40 mg daily x 4 days to complete 7 day course. Please follow up with your Pulmonologist Dr. Elsworth Soho next week to taper Prednisone as tolerated back to regular 10 mg daily.  Discharge Diagnoses: Active Problems:   CAD (coronary artery disease)   Essential hypertension   COPD (chronic obstructive pulmonary disease) with chronic bronchitis (HCC)   Status post coronary artery bypass grafting   Acute on chronic respiratory failure with hypoxia (HCC)   GERD (gastroesophageal reflux disease)   Diabetes (HCC)   HLD (hyperlipidemia)   COPD exacerbation (HCC)   COPD with acute exacerbation (HCC)  Resolved Problems:   Hypertensive disorder  Hospital Course: 67 year old female with PMH of HTN, HL, COPD on chronic prednisone 10 mg daily, Chronic Hypoxic Respiratory Failure on 3L [place] at baseline, CAD s/p CABG, Diastolic Heart Failure, and GERD who presented to the ED on 1/18 with acute dyspnea due to an acute COPD exacerbation.  She briefly required BIPAP.   1/19: Oxygen status is back to baseline.  [name] remains short of breath 1/20: Oxygen saturation is stable.  [name] symptoms have improved.  Assessment and Plan:   Progress Note     Patient: [name] HBZ:[id number] DOB: 03/30/1956 DOA: 11/07/2022     0 DOS: the patient was seen and examined on 11/08/2022   Brief hospital course: 67 year old female with PMH of HTN, HL, COPD on chronic prednisone 10 mg daily, CHRF on 3L [place] at baseline, CAD s/p CABG, Diastolic Heart Failure, and GERD who presented to the ED on 1/18 with acute dyspnea due to an acute COPD exacerbation.  She briefly required BIPAP.   1/19: Oxygen status was back to baseline.  But [name] remains  significantly short of breath.   1/20: Oxygen status was stable. [name] symptoms had improved.   Assessment and Plan: Acute on Chronic Hypoxic Respiratory Failure, resolved: - Oxygen status is at baseline 2-3L [place].   Acute COPD Exacerbation: - Continue nebs. - Patient received 3 days of steroids and was discharged on Prednisone 40 mg daily x 4 days to complete 7 day course.  Follow up with Pulmonologist outpatient to taper down to regular dosing.  Small Bilateral Pleural Effusions: - Continue home Lasix.     CAD s/p CABG: - Continue Aspirin, BB, and Statin.   Diabetes Mellitus Type 2: - Continue home med Trulicity.   Hypertension: - Continue home meds.   Chronic Grade 2 Diastolic Heart Failure: - Continue home meds.   Obesity: BMI is 33.53 kg/m2. - Counseled on weight loss through diet and lifestyle modifications.   Code Status DNR/DNI     Disposition: Home Diet recommendation:  Discharge Diet Orders (From admission, onward)     Start     Ordered   11/09/22 0000  Diet - low sodium heart healthy        11/09/22 1107           Cardiac and Carb modified diet DISCHARGE MEDICATION: Allergies as of 11/09/2022       Reactions   Augmentin [amoxicillin-pot Clavulanate] Nausea And Vomiting   Chantix [varenicline] Nausea And Vomiting        Medication List  TAKE these medications    acetaminophen 500 MG tablet Commonly known as: TYLENOL Take 1,000 mg by mouth 2 (two) times daily as needed for headache, fever, moderate pain or mild pain.   albuterol 108 (90 Base) MCG/ACT inhaler Commonly known as: VENTOLIN HFA INHALE 1-2 PUFFS BY MOUTH EVERY 6 HOURS AS NEEDED FOR WHEEZE OR SHORTNESS OF BREATH What changed: See the new instructions.   albuterol (2.5 MG/3ML) 0.083% nebulizer solution Commonly known as: PROVENTIL Take 3 mLs (2.5 mg total) by nebulization every 6 (six) hours as needed for wheezing or shortness of breath. What changed: Another medication  with the same name was changed. Make sure you understand how and when to take each.   amLODipine 5 MG tablet Commonly known as: NORVASC Take 1 tablet (5 mg total) by mouth daily. What changed: when to take this   atorvastatin 40 MG tablet Commonly known as: LIPITOR TAKE 1 TABLET BY MOUTH EVERY DAY What changed: when to take this   carvedilol 12.5 MG tablet Commonly known as: COREG Take 1 tablet (12.5 mg total) by mouth 2 (two) times daily with a meal.   fluticasone 50 MCG/ACT nasal spray Commonly known as: FLONASE Place 2 sprays into both nostrils daily. What changed: when to take this   furosemide 20 MG tablet Commonly known as: Lasix Take 1 tablet (20 mg total) by mouth daily for 3 days. What changed:  medication strength how much to take when to take this   ipratropium-albuterol 0.5-2.5 (3) MG/3ML Soln Commonly known as: DUONEB Take 3 mLs by nebulization every 6 (six) hours as needed (wheezing, shortness of breath).   isosorbide mononitrate 60 MG 24 hr tablet Commonly known as: IMDUR Take 1.5 tablets (90 mg total) by mouth daily. What changed: when to take this   MAGNESIUM OXIDE PO Take 1 tablet by mouth at bedtime.   metFORMIN 500 MG 24 hr tablet Commonly known as: GLUCOPHAGE-XR TAKE 1 TABLET BY MOUTH EVERY DAY WITH BREAKFAST What changed: See the new instructions.   Multivitamin Women 50+ Tabs Take 1 tablet by mouth in the morning.   predniSONE 20 MG tablet Commonly known as: DELTASONE Take 2 tablets (40 mg total) by mouth daily with breakfast for 4 days. What changed: You were already taking a medication with the same name, and this prescription was added. Make sure you understand how and when to take each.   predniSONE 10 MG tablet Commonly known as: DELTASONE Take 1 tablet (10 mg total) by mouth daily with breakfast. Start taking on: November 15, 2022 What changed: These instructions start on November 15, 2022. If you are unsure what to do until then,  ask your doctor or other care provider.   Trelegy Ellipta 200-62.5-25 MCG/ACT Aepb Generic drug: Fluticasone-Umeclidin-Vilant TAKE 1 PUFF BY MOUTH EVERY DAY What changed: See the new instructions.   Trulicity 0.98 JX/9.1YN Sopn Generic drug: Dulaglutide Inject 0.75 mg into the skin once a week.   VITAMIN B-12 PO Take 1 tablet by mouth in the morning.   VITAMIN D-3 PO Take 1 capsule by mouth in the morning.        Discharge Exam: Filed Weights   11/07/22 2054 11/08/22 0627 11/09/22 0623  Weight: 80.5 kg 80.5 kg 83.9 kg   Physical Exam Constitutional:      General: She is no acute distress    Appearance: She is obese.  HENT:     Head: Normocephalic and atraumatic.  Eyes:     Extraocular Movements: Extraocular  movements intact.     Pupils: Pupils are equal, round, and reactive to light.  Neck:     Thyroid: No thyromegaly.     Vascular: No JVD.  Cardiovascular:     Rate and Rhythm: Normal rate and regular rhythm.  Pulmonary:     Effort: Pulmonary effort is normal.     Comments: improved, mild expiratory wheezing Chest:     Chest wall: No tenderness.  Abdominal:     General: Bowel sounds are normal.     Palpations: Abdomen is soft.     Tenderness: There is no abdominal tenderness.  Musculoskeletal:     Cervical back: Normal range of motion and neck supple.  Skin:    General: Skin is warm and dry.     Capillary Refill: Capillary refill takes less than 2 seconds.  Neurological:     General: No focal deficit present.  Psychiatric:        Mood and Affect: Mood normal.   Condition at discharge: stable  The results of significant diagnostics from this hospitalization (including imaging, microbiology, ancillary and laboratory) are listed below for reference.   Imaging Studies: DG Chest Port 1 View  Result Date: 11/07/2022 CLINICAL DATA:  Dyspnea.  Shortness of breath EXAM: PORTABLE CHEST 1 VIEW COMPARISON:  08/29/2022 and older FINDINGS: Enlarged  cardiopericardial silhouette with postop changes. Clips and sternal wires. Hyperinflation. No pneumothorax or effusion. Tiny right effusion versus pleural thickening. Mild interstitial prominence. Overlapping cardiac leads. IMPRESSION: Postop chest with enlarged heart. Hyperinflation with interstitial changes similar to previous. Tiny right effusion versus pleural thickening. Electronically Signed   By: Jill Side M.D.   On: 11/07/2022 12:44    Microbiology: Results for orders placed or performed during the hospital encounter of 11/07/22  Resp panel by RT-PCR (RSV, Flu A&B, Covid) Anterior Nasal Swab     Status: None   Collection Time: 11/07/22 11:58 AM   Specimen: Anterior Nasal Swab  Result Value Ref Range Status   SARS Coronavirus 2 by RT PCR NEGATIVE NEGATIVE Final    Comment: (NOTE) SARS-CoV-2 target nucleic acids are NOT DETECTED.  The SARS-CoV-2 RNA is generally detectable in upper respiratory specimens during the acute phase of infection. The lowest concentration of SARS-CoV-2 viral copies this assay can detect is 138 copies/mL. A negative result does not preclude SARS-Cov-2 infection and should not be used as the sole basis for treatment or other patient management decisions. A negative result may occur with  improper specimen collection/handling, submission of specimen other than nasopharyngeal swab, presence of viral mutation(s) within the areas targeted by this assay, and inadequate number of viral copies(<138 copies/mL). A negative result must be combined with clinical observations, patient history, and epidemiological information. The expected result is Negative.  Fact Sheet for Patients:  EntrepreneurPulse.com.au  Fact Sheet for Healthcare Providers:  IncredibleEmployment.be  This test is no t yet approved or cleared by the [place] FDA and  has been authorized for detection and/or diagnosis of SARS-CoV-2 by FDA under an  Emergency Use Authorization (EUA). This EUA will remain  in effect (meaning this test can be used) for the duration of the COVID-19 declaration under Section 564(b)(1) of the Act, 21 U.S.C.section 360bbb-3(b)(1), unless the authorization is terminated  or revoked sooner.       Influenza A by PCR NEGATIVE NEGATIVE Final   Influenza B by PCR NEGATIVE NEGATIVE Final    Comment: (NOTE) The Xpert Xpress SARS-CoV-2/FLU/RSV plus assay is intended as an aid in the diagnosis  of influenza from Nasopharyngeal swab specimens and should not be used as a sole basis for treatment. Nasal washings and aspirates are unacceptable for Xpert Xpress SARS-CoV-2/FLU/RSV testing.  Fact Sheet for Patients: EntrepreneurPulse.com.au  Fact Sheet for Healthcare Providers: IncredibleEmployment.be  This test is not yet approved or cleared by the [place] FDA and has been authorized for detection and/or diagnosis of SARS-CoV-2 by FDA under an Emergency Use Authorization (EUA). This EUA will remain in effect (meaning this test can be used) for the duration of the COVID-19 declaration under Section 564(b)(1) of the Act, 21 U.S.C. section 360bbb-3(b)(1), unless the authorization is terminated or revoked.     Resp Syncytial Virus by PCR NEGATIVE NEGATIVE Final    Comment: (NOTE) Fact Sheet for Patients: EntrepreneurPulse.com.au  Fact Sheet for Healthcare Providers: IncredibleEmployment.be  This test is not yet approved or cleared by the [place] FDA and has been authorized for detection and/or diagnosis of SARS-CoV-2 by FDA under an Emergency Use Authorization (EUA). This EUA will remain in effect (meaning this test can be used) for the duration of the COVID-19 declaration under Section 564(b)(1) of the Act, 21 U.S.C. section 360bbb-3(b)(1), unless the authorization is terminated or revoked.  Performed at [facility], [place] [address] [place], [place] [address]     Labs: CBC: Recent Labs  Lab 11/07/22 1214 11/07/22 1228 11/08/22 0139 11/09/22 0103  WBC 16.9*  --  9.6 10.8*  HGB 13.9 15.0 13.4 13.3  HCT 47.0* 44.0 45.2 44.1  MCV 95.9  --  94.8 93.6  PLT 223  --  179 993   Basic Metabolic Panel: Recent Labs  Lab 11/07/22 1214 11/07/22 1228 11/08/22 0139 11/09/22 0103  NA 141 137 142 143  K 4.3 4.1 5.1 4.9  CL 94*  --  93* 94*  CO2 39*  --  41* 41*  GLUCOSE 250*  --  222* 166*  BUN 8  --  13 11  CREATININE 0.43*  --  0.53 0.37*  CALCIUM 9.0  --  9.5 9.3   Liver Function Tests: Recent Labs  Lab 11/07/22 1214 11/08/22 0139  AST 22 19  ALT 29 27  ALKPHOS 76 66  BILITOT 0.4 0.3  PROT 6.8 6.4*  ALBUMIN 3.8 3.5   CBG: Recent Labs  Lab 11/08/22 1107 11/08/22 1621 11/08/22 2036 11/09/22 0621 11/09/22 1119  GLUCAP 263* 236* 230* 195* 182*    Discharge time spent: greater than 30 minutes.  Signed: George Hugh, MD [facility] 11/10/2022

## 2022-11-11 ENCOUNTER — Encounter: Payer: Self-pay | Admitting: *Deleted

## 2022-11-11 ENCOUNTER — Telehealth: Payer: Self-pay | Admitting: *Deleted

## 2022-11-11 NOTE — Patient Outreach (Signed)
  Care Coordination [facility] Note Transition Care Management Follow-up Telephone Call Date of discharge and from where: Saturday, 11/09/22; [place]; Acute on chronic respiratory failure; COPD exacerbation with bronchitis How have you been since you were released from the hospital? "I am doing fine; back to normal and not having any problems.  I am back to driving myself; my sister is here if I run into problems, but I am taking care of myself just fine; I am using the oxygen and taking the extra prednisone like they told me to." Any questions or concerns? No  Items Reviewed: Did the pt receive and understand the discharge instructions provided? Yes  reviewed thoroughly with patient who verbalizes very good understanding of same Medications obtained and verified? Yes  full medication review completed; patient confirms she self- manages medications and denies questions/ concerns around medications; she is able to verbalize recent changes to medications without prompting Other? No  Any new allergies since your discharge? No  Dietary orders reviewed? Yes Do you have support at home? Yes  patient reports she is independent in self-care activities; reports her sister is with her and able to assist as/if needed  Home Care and Equipment/Supplies: Were home health services ordered? no If so, what is the name of the agency? N/A  Has the agency set up a time to come to the patient's home? not applicable Were any new equipment or medical supplies ordered?  No What is the name of the medical supply agency? N/A Were you able to get the supplies/equipment? not applicable Do you have any questions related to the use of the equipment or supplies? No N/A  Functional Questionnaire: (I = Independent and D = Dependent) ADLs: I  Bathing/Dressing- I  Meal Prep- I  Eating- I  Maintaining continence- I  Transferring/Ambulation- I  Managing Meds- I  Follow up appointments reviewed:  PCP Hospital f/u appt  confirmed? Yes  Scheduled to see PCP, dr. Jenny Reichmann on Thursday 11/14/22 @ 1:00 pm Specialist Hospital f/u appt confirmed? Yes  Scheduled to see pulmonary provider on Tuesday, 12/10/22 @ 2:00 pm Are transportation arrangements needed? No  If their condition worsens, is the pt aware to call PCP or go to the Emergency Dept.? Yes Was the patient provided with contact information for the PCP's office or ED? No- patient declined; reports already has contact information for all care providers Was to pt encouraged to call back with questions or concerns? Yes- patient declined need to take my direct phone number for call back if needed; she declines need for ongoing care coordination   SDOH assessments and interventions completed:   Yes SDOH Interventions Today    Flowsheet Row Most Recent Value  SDOH Interventions   Food Insecurity Interventions Intervention Not Indicated  Transportation Interventions Intervention Not Indicated  [drives self per patient report]      Care Coordination Interventions:  Full medication review completed; reinforced need to follow low salt diet; reinforced safe use of home O2    Encounter Outcome:  Pt. Visit Completed    Oneta Rack, RN, BSN, CCRN Alumnus RN CM Care Coordination/ Transition of [place] Management [phone]: direct office

## 2022-11-14 ENCOUNTER — Ambulatory Visit (INDEPENDENT_AMBULATORY_CARE_PROVIDER_SITE_OTHER): Payer: Medicare HMO | Admitting: Internal Medicine

## 2022-11-14 VITALS — BP 112/62 | HR 70 | Temp 98.4°F | Ht 61.0 in | Wt 185.0 lb

## 2022-11-14 DIAGNOSIS — Z0001 Encounter for general adult medical examination with abnormal findings: Secondary | ICD-10-CM | POA: Diagnosis not present

## 2022-11-14 DIAGNOSIS — Z23 Encounter for immunization: Secondary | ICD-10-CM | POA: Diagnosis not present

## 2022-11-14 DIAGNOSIS — E1165 Type 2 diabetes mellitus with hyperglycemia: Secondary | ICD-10-CM | POA: Diagnosis not present

## 2022-11-14 DIAGNOSIS — E2839 Other primary ovarian failure: Secondary | ICD-10-CM

## 2022-11-14 DIAGNOSIS — J449 Chronic obstructive pulmonary disease, unspecified: Secondary | ICD-10-CM | POA: Diagnosis not present

## 2022-11-14 DIAGNOSIS — I10 Essential (primary) hypertension: Secondary | ICD-10-CM | POA: Diagnosis not present

## 2022-11-14 DIAGNOSIS — J441 Chronic obstructive pulmonary disease with (acute) exacerbation: Secondary | ICD-10-CM

## 2022-11-14 LAB — POCT GLYCOSYLATED HEMOGLOBIN (HGB A1C): Hemoglobin A1C: 7.1 % — AB (ref 4.0–5.6)

## 2022-11-14 MED ORDER — PREDNISONE 10 MG PO TABS: ORAL_TABLET | ORAL | 0 refills | Status: DC

## 2022-11-14 NOTE — Progress Notes (Signed)
Patient ID: [name], female   DOB: Mar 15, 1956, 67 y.o.   MRN: [id number]         Chief Complaint:: wellness exam and 67mofollow up (Was in hospital on Thursday until Saturday)  , copd exacerbation, DM, leukocytosis       HPI:  Rhonda CRAYTONis a 67y.o. female here for wellness exam; declines Tdap, LDCT screening referral, will consider having the shingrx at pharmacy, for prevnar 20, for DXA, o/w up to date  Still smoking, not ready to quit                        Also recent hospd with copd exacerbation now much improved and stable post hospn without improving sob doe and wheezing tightness.  Pt denies chest pain, increased sob or doe, wheezing, orthopnea, PND, increased LE swelling, palpitations, dizziness or syncope.   Pt denies polydipsia, polyuria, or new focal neuro s/s.    Pt denies fever, wt loss, night sweats, loss of appetite, or other constitutional symptoms  F/u with pulm feb 24   Wt Readings from Last 3 Encounters:  11/14/22 185 lb (83.9 kg)  11/09/22 184 lb 14.4 oz (83.9 kg)  10/25/22 185 lb (83.9 kg)   BP Readings from Last 3 Encounters:  11/14/22 112/62  11/09/22 125/60  10/25/22 (!) 106/58   Immunization History  Administered Date(s) Administered   COVID-19, mRNA, vaccine(Comirnaty)12 years and older 10/24/2022   Fluad Quad(high Dose 65+) 07/10/2022   Influenza Split 07/08/2013   Influenza,inj,Quad PF,6+ Mos 07/31/2015, 09/03/2017, 10/04/2021   PFIZER Comirnaty(Gray Top)Covid-19 Tri-Sucrose Vaccine 08/31/2020   PFIZER(Purple Top)SARS-COV-2 Vaccination 01/01/2020, 01/25/2020   PNEUMOCOCCAL CONJUGATE-20 11/14/2022   Pneumococcal Polysaccharide-23 02/02/2014, 05/03/2019   Health Maintenance Due  Topic Date Due   DTaP/Tdap/Td (1 - Tdap) Never done   Lung Cancer Screening  01/27/2021      Past Medical History:  Diagnosis Date   Bronchitis    CAD (coronary artery disease)    a. 02/2002: CABG x3 with LIMA to LAD, RIMA to RCA, and SVG to OM   CHF  (congestive heart failure) ([place]    COPD (chronic obstructive pulmonary disease) ([place]    Diabetes ([place]    Headache    Heart disease    Hyperlipidemia    Hypertension    Hypertensive emergency 07/01/2013   Impaired glucose tolerance 08/20/2014   Sinusitis    Past Surgical History:  Procedure Laterality Date   CORONARY ARTERY BYPASS GRAFT  2003   Triple bypass   ESOPHAGOGASTRODUODENOSCOPY Left 06/13/2014   Procedure: ESOPHAGOGASTRODUODENOSCOPY (EGD);  Surgeon: JJuanita Craver MD;  [facility]  Service: Endoscopy;  Laterality: Left;   RIGID ESOPHAGOSCOPY N/A 06/12/2014   Procedure: RIGID ESOPHAGOSCOPY WITH FOREIGN BODY REMOVAL;  Surgeon: SAscencion Dike MD;  [place] [facility]  Service: ENT;  Laterality: N/A;    reports that she has been smoking cigarettes. She started smoking about 50 years ago. She has a 23.50 pack-year smoking history. She has never used smokeless tobacco. She reports current alcohol use of about 5.0 - 6.0 standard drinks of alcohol per week. She reports that she does not currently use drugs after having used the following drugs: Marijuana. family history includes Brain cancer in her mother; Cancer in her maternal grandmother and another family member; Heart disease in her mother; Lung cancer in her maternal aunt and paternal uncle; Stroke in an other family member; Suicidality in her brother. Allergies  Allergen Reactions  Augmentin [Amoxicillin-Pot Clavulanate] Nausea And Vomiting   Chantix [Varenicline] Nausea And Vomiting   Current Outpatient Medications on File Prior to Visit  Medication Sig Dispense Refill   acetaminophen (TYLENOL) 500 MG tablet Take 1,000 mg by mouth 2 (two) times daily as needed for headache, fever, moderate pain or mild pain.     albuterol (PROVENTIL) (2.5 MG/3ML) 0.083% nebulizer solution Take 3 mLs (2.5 mg total) by nebulization every 6 (six) hours as needed for wheezing or shortness of breath. 75 mL 12   albuterol (VENTOLIN HFA) 108 (90  Base) MCG/ACT inhaler INHALE 1-2 PUFFS BY MOUTH EVERY 6 HOURS AS NEEDED FOR WHEEZE OR SHORTNESS OF BREATH (Patient taking differently: Inhale 2 puffs into the lungs every 6 (six) hours as needed for shortness of breath or wheezing.) 18 g 5   amLODipine (NORVASC) 5 MG tablet Take 1 tablet (5 mg total) by mouth daily. (Patient taking differently: Take 5 mg by mouth in the morning.) 90 tablet 3   atorvastatin (LIPITOR) 40 MG tablet TAKE 1 TABLET BY MOUTH EVERY DAY (Patient taking differently: Take 40 mg by mouth in the morning.) 90 tablet 3   carvedilol (COREG) 12.5 MG tablet Take 1 tablet (12.5 mg total) by mouth 2 (two) times daily with a meal. 180 tablet 3   Cholecalciferol (VITAMIN D-3 PO) Take 1 capsule by mouth in the morning.     Cyanocobalamin (VITAMIN B-12 PO) Take 1 tablet by mouth in the morning.     Dulaglutide (TRULICITY) 1.82 XH/3.7JI SOPN Inject 0.75 mg into the skin once a week. 6 mL 0   fluticasone (FLONASE) 50 MCG/ACT nasal spray Place 2 sprays into both nostrils daily. (Patient taking differently: Place 2 sprays into both nostrils in the morning.) 16 g 6   ipratropium-albuterol (DUONEB) 0.5-2.5 (3) MG/3ML SOLN Take 3 mLs by nebulization every 6 (six) hours as needed (wheezing, shortness of breath).     isosorbide mononitrate (IMDUR) 60 MG 24 hr tablet Take 1.5 tablets (90 mg total) by mouth daily. (Patient taking differently: Take 90 mg by mouth at bedtime.) 135 tablet 3   MAGNESIUM OXIDE PO Take 1 tablet by mouth at bedtime.     metFORMIN (GLUCOPHAGE-XR) 500 MG 24 hr tablet TAKE 1 TABLET BY MOUTH EVERY DAY WITH BREAKFAST (Patient taking differently: Take 500 mg by mouth daily with breakfast.) 90 tablet 1   Multiple Vitamins-Minerals (MULTIVITAMIN WOMEN 50+) TABS Take 1 tablet by mouth in the morning.     TRELEGY ELLIPTA 200-62.5-25 MCG/ACT AEPB TAKE 1 PUFF BY MOUTH EVERY DAY (Patient taking differently: Inhale 1 puff into the lungs in the morning.) 60 each 4   furosemide (LASIX) 20 MG  tablet Take 1 tablet (20 mg total) by mouth daily for 3 days. 3 tablet 0   Current Facility-Administered Medications on File Prior to Visit  Medication Dose Route Frequency Provider Last Rate Last Admin   nicotine (NICOTROL) 10 MG inhaler 1 continuous puffing  1 continuous puffing Inhalation PRN Rigoberto Noel, MD       regadenoson (LEXISCAN) injection SOLN 0.4 mg  0.4 mg Intravenous Once Fay Records, MD       technetium tetrofosmin (TC-MYOVIEW) injection 30 millicurie  30 millicurie Intravenous Once PRN Fay Records, MD            ROS:  All others reviewed and negative.  Objective        PE:  BP 112/62 (BP Location: Left Arm, Patient Position: Sitting, Cuff Size: Large)  Pulse 70   Temp 98.4 F (36.9 C) (Oral)   Ht '5\' 1"'$  (1.549 m)   Wt 185 lb (83.9 kg)   SpO2 95%   BMI 34.96 kg/m                 Constitutional: Pt appears in NAD               HENT: Head: NCAT.                Right Ear: External ear normal.                 Left Ear: External ear normal.                Eyes: . Pupils are equal, round, and reactive to light. Conjunctivae and EOM are normal               Nose: without d/c or deformity               Neck: Neck supple. Gross normal ROM               Cardiovascular: Normal rate and regular rhythm.                 Pulmonary/Chest: Effort normal and breath sounds without rales or wheezing.                Abd:  Soft, NT, ND, + BS, no organomegaly               Neurological: Pt is alert. At baseline orientation, motor grossly intact               Skin: Skin is warm. No rashes, no other new lesions, LE edema - none               Psychiatric: Pt behavior is normal without agitation   Micro: none  Cardiac tracings I have personally interpreted today:  none  Pertinent Radiological findings (summarize): none   Lab Results  Component Value Date   WBC 10.8 (H) 11/09/2022   HGB 13.3 11/09/2022   HCT 44.1 11/09/2022   PLT 162 11/09/2022   GLUCOSE 166 (H) 11/09/2022    CHOL 171 06/28/2022   TRIG 235.0 (H) 06/28/2022   HDL 57.40 06/28/2022   LDLDIRECT 79.0 06/28/2022   LDLCALC 66 03/07/2022   ALT 27 11/08/2022   AST 19 11/08/2022   NA 143 11/09/2022   K 4.9 11/09/2022   CL 94 (L) 11/09/2022   CREATININE 0.37 (L) 11/09/2022   BUN 11 11/09/2022   CO2 41 (H) 11/09/2022   TSH 4.09 03/07/2022   HGBA1C 7.1 (A) 11/14/2022   MICROALBUR 8.7 (H) 03/07/2022   Assessment/Plan:  AZELEA SEGUIN is a 67 y.o. Other or two or more races [6] female with  has a past medical history of Bronchitis, CAD (coronary artery disease), CHF (congestive heart failure) ([place]), COPD (chronic obstructive pulmonary disease) ([place]), Diabetes ([place]), Headache, Heart disease, Hyperlipidemia, Hypertension, Hypertensive emergency (07/01/2013), Impaired glucose tolerance (08/20/2014), and Sinusitis.  Encounter for well adult exam with abnormal findings Age and sex appropriate education and counseling updated with regular exercise and diet Referrals for preventative services - decliens LDCT screening, but for DXA Immunizations addressed - for shingrx at pharmacy, declines tdap, but for prevnar 20 qd Smoking counseling  - pt counsled to quit, pt not ready Evidence for depression or other mood disorder - none significant Most recent labs reviewed. I have personally  reviewed and have noted: 1) the patient's medical and social history 2) The patient's current medications and supplements 3) The patient's height, weight, and BMI have been recorded in the chart   COPD exacerbation ([place]) Improving but persistent, for further prednisone taper, cont inhaler prn,  to f/u any worsening symptoms or concerns  Diabetes ([place]) Lab Results  Component Value Date   HGBA1C 7.1 (A) 11/14/2022   uncontrolled, pt to continue current medical treatment  - trulicity 8.67 mg weekly, metformin ER 500 mg - 1 qd as declines change for now   Estrogen deficiency For DXA  Essential hypertension BP Readings  from Last 3 Encounters:  11/14/22 112/62  11/09/22 125/60  10/25/22 (!) 106/58   Stable, pt to continue medical treatment norvasc 5 mg qd, coreg 12.5 bid  Followup: Return in about 3 months (around 02/13/2023).  Cathlean Cower, MD 11/17/2022 8:36 PM [facility]

## 2022-11-14 NOTE — Patient Instructions (Addendum)
Please have your Shingrix (shingles) shots done at your local pharmacy.  You had the Prevnar 20 pneumonia shot today  Your A1c was done today  Please take all new medication as prescribed - the prednisone taper  Please schedule the bone density test before leaving today at the scheduling desk (where you check out)  Please continue all other medications as before, and refills have been done if requested.  Please have the pharmacy call with any other refills you may need.  Please continue your efforts at being more active, low cholesterol diet, and weight control.  You are otherwise up to date with prevention measures today.  Please keep your appointments with your specialists as you may have planned - Dr Elsworth Soho on Feb 24  No other lab testing needed today  Please make an Appointment to return in 3 months, or sooner if needed

## 2022-11-17 ENCOUNTER — Encounter: Payer: Self-pay | Admitting: Internal Medicine

## 2022-11-17 NOTE — Assessment & Plan Note (Signed)
BP Readings from Last 3 Encounters:  11/14/22 112/62  11/09/22 125/60  10/25/22 (!) 106/58   Stable, pt to continue medical treatment norvasc 5 mg qd, coreg 12.5 bid

## 2022-11-17 NOTE — Assessment & Plan Note (Signed)
For DXA

## 2022-11-17 NOTE — Assessment & Plan Note (Addendum)
Age and sex appropriate education and counseling updated with regular exercise and diet Referrals for preventative services - decliens LDCT screening, but for DXA Immunizations addressed - for shingrx at pharmacy, declines tdap, but for prevnar 20 qd Smoking counseling  - pt counsled to quit, pt not ready Evidence for depression or other mood disorder - none significant Most recent labs reviewed. I have personally reviewed and have noted: 1) the patient's medical and social history 2) The patient's current medications and supplements 3) The patient's height, weight, and BMI have been recorded in the chart

## 2022-11-17 NOTE — Assessment & Plan Note (Signed)
Lab Results  Component Value Date   HGBA1C 7.1 (A) 11/14/2022   uncontrolled, pt to continue current medical treatment  - trulicity 8.59 mg weekly, metformin ER 500 mg - 1 qd as declines change for now

## 2022-11-17 NOTE — Assessment & Plan Note (Signed)
Improving but persistent, for further prednisone taper, cont inhaler prn,  to f/u any worsening symptoms or concerns

## 2022-11-28 ENCOUNTER — Other Ambulatory Visit: Payer: Self-pay | Admitting: Internal Medicine

## 2022-11-28 DIAGNOSIS — Z1231 Encounter for screening mammogram for malignant neoplasm of breast: Secondary | ICD-10-CM

## 2022-12-10 ENCOUNTER — Ambulatory Visit (INDEPENDENT_AMBULATORY_CARE_PROVIDER_SITE_OTHER): Payer: Medicare HMO | Admitting: Pulmonary Disease

## 2022-12-10 ENCOUNTER — Encounter (HOSPITAL_BASED_OUTPATIENT_CLINIC_OR_DEPARTMENT_OTHER): Payer: Self-pay | Admitting: Pulmonary Disease

## 2022-12-10 VITALS — BP 140/60 | HR 84 | Temp 98.9°F | Ht 61.0 in | Wt 181.4 lb

## 2022-12-10 DIAGNOSIS — J9612 Chronic respiratory failure with hypercapnia: Secondary | ICD-10-CM

## 2022-12-10 DIAGNOSIS — J9611 Chronic respiratory failure with hypoxia: Secondary | ICD-10-CM

## 2022-12-10 DIAGNOSIS — J441 Chronic obstructive pulmonary disease with (acute) exacerbation: Secondary | ICD-10-CM | POA: Diagnosis not present

## 2022-12-10 MED ORDER — PREDNISONE 10 MG PO TABS: ORAL_TABLET | ORAL | 2 refills | Status: DC

## 2022-12-10 NOTE — Assessment & Plan Note (Signed)
She is prednisone dependent.  She is recovering from recent exacerbation. Will provide a refill on prednisone She will continue on Trelegy. We discussed signs and symptoms of COPD exacerbation and action plan

## 2022-12-10 NOTE — Assessment & Plan Note (Signed)
Continue on Breztri. We discussed side effects of long-term prednisone.  She is very well aware but prefers to breathe and is willing to accept side effects We discussed action plan for COPD and signs and symptoms of exacerbation

## 2022-12-10 NOTE — Assessment & Plan Note (Signed)
She was noted to be hypercarbic during hospital stay.  She has all symptoms of hypercarbia.  I cautioned her against increasing the oxygen level to high.  She should aim for oxygen saturation between 88 and 92%.  We will try to get her NIV  The patient continues to exhibit signs of hypercapnea associated with chronic respiratory failure secondary to severe COPD .  Interruption or failure to provide NIV would quickly lead to exacerbation of the patient's condition, hospital readmission, and likely harm the patient.  Continued use is preferred.  The use of the NIV will treat the patient's PCO2 levels and can reduce the risk of exacerbations and future hospitalizations when used at night and during the day.  Bilevel/RAD therapy with and without a rate would be ineffective as the patient requires a volume targeted mode.  Ventilation is required to decrease the work of breathing and improve pulmonary status.  Interruption of ventilator support would lead to a decline of health status.  Patient is able to protect their airways and clear secretions on their own.

## 2022-12-10 NOTE — Patient Instructions (Addendum)
We will try to get to her home BiPAP machine/NIV Try to use this every night, for at least 4 hours  Goal oxygen saturation for you should be 88 to 92%  xRx for prednisone 10 mg tablets, take 1 to 2 tablets daily #60 x 2 refills

## 2022-12-10 NOTE — Progress Notes (Signed)
   Subjective:    Patient ID: [name], female    DOB: 1956/09/24, 67 y.o.   MRN: [id number]  HPI  67 yo smoker for FU of COPD, quit 11/2017 and mild tracheomalacia with chronic hypoxic resp failure on o2 since 2018   Recurrent exacerbations in 2022, requiring maintenance prednisone starting 06/2021  PMH - CABG HFpEF  71-monthfollow-up visit. She was hospitalized 1/18 for 2 days for COPD exacerbation treated with BiPAP, steroids and bronchodilators.  She does not remember events leading to hospital visi DNR status during hospitalization ABG was 7.3 2/82/69  She reports being forgetful and very sleepy during the day Her sister accompanies and corroborates history.  She has not had a prednisone and even on decreasing to 10 mg/day she has increased symptoms She is compliant with Trelegy Baseline oxygen requirement is 2.5 L but sometimes they increase it to 3 L Should gradually takes Lasix for pedal edema  Significant tests/ events reviewed CT angiogram chest 01/2020-emphysema, mild tracheomalacia   04/2019-pulmonary function test- FVC 1.05 (44% predicted), postbronchodilator ratio 47, postbronchodilator FEV1 0.47 (25% predicted), DLCO 54   Spirometry 02/2017 >> ratio of 59, FEV1 of 42% FVC of 56% ABG 01/2014 was 7.29/66/75/92%   08/2014 >> PSG neg for OSA , Desaturation as low as 85%  Review of Systems neg for any significant sore throat, dysphagia, itching, sneezing, nasal congestion or excess/ purulent secretions, fever, chills, sweats, unintended wt loss, pleuritic or exertional cp, hempoptysis, orthopnea pnd or change in chronic leg swelling. Also denies presyncope, palpitations, heartburn, abdominal pain, nausea, vomiting, diarrhea or change in bowel or urinary habits, dysuria,hematuria, rash, arthralgias, visual complaints, headache, numbness weakness or ataxia.     Objective:   Physical Exam  Gen. Pleasant, obese, in no distress ENT - no lesions, no post nasal drip,  steroid facies Neck: No JVD, no thyromegaly, no carotid bruits Lungs: no use of accessory muscles, no dullness to percussion, decreased without rales or rhonchi  Cardiovascular: Rhythm regular, heart sounds  normal, no murmurs or gallops, no peripheral edema Musculoskeletal: No deformities, no cyanosis or clubbing , no tremors       Assessment & Plan:

## 2022-12-11 ENCOUNTER — Other Ambulatory Visit: Payer: Medicare HMO

## 2022-12-11 ENCOUNTER — Other Ambulatory Visit: Payer: Self-pay | Admitting: Pulmonary Disease

## 2022-12-11 DIAGNOSIS — Z8249 Family history of ischemic heart disease and other diseases of the circulatory system: Secondary | ICD-10-CM | POA: Diagnosis not present

## 2022-12-11 DIAGNOSIS — J439 Emphysema, unspecified: Secondary | ICD-10-CM | POA: Diagnosis not present

## 2022-12-11 DIAGNOSIS — I4891 Unspecified atrial fibrillation: Secondary | ICD-10-CM | POA: Diagnosis not present

## 2022-12-11 DIAGNOSIS — E669 Obesity, unspecified: Secondary | ICD-10-CM | POA: Diagnosis not present

## 2022-12-11 DIAGNOSIS — I110 Hypertensive heart disease with heart failure: Secondary | ICD-10-CM | POA: Diagnosis not present

## 2022-12-11 DIAGNOSIS — I509 Heart failure, unspecified: Secondary | ICD-10-CM | POA: Diagnosis not present

## 2022-12-11 DIAGNOSIS — E785 Hyperlipidemia, unspecified: Secondary | ICD-10-CM | POA: Diagnosis not present

## 2022-12-11 DIAGNOSIS — K5900 Constipation, unspecified: Secondary | ICD-10-CM | POA: Diagnosis not present

## 2022-12-11 DIAGNOSIS — M1990 Unspecified osteoarthritis, unspecified site: Secondary | ICD-10-CM | POA: Diagnosis not present

## 2022-12-11 DIAGNOSIS — Z515 Encounter for palliative care: Secondary | ICD-10-CM

## 2022-12-11 DIAGNOSIS — I25119 Atherosclerotic heart disease of native coronary artery with unspecified angina pectoris: Secondary | ICD-10-CM | POA: Diagnosis not present

## 2022-12-11 DIAGNOSIS — E876 Hypokalemia: Secondary | ICD-10-CM | POA: Diagnosis not present

## 2022-12-11 DIAGNOSIS — D84821 Immunodeficiency due to drugs: Secondary | ICD-10-CM | POA: Diagnosis not present

## 2022-12-12 ENCOUNTER — Telehealth: Payer: Self-pay | Admitting: Cardiology

## 2022-12-12 NOTE — Telephone Encounter (Signed)
Pt c/o medication issue:  1. Name of Medication:   carvedilol (COREG) 12.5 MG tablet   2. How are you currently taking this medication (dosage and times per day)?   As prescribed  3. Are you having a reaction (difficulty breathing--STAT)?     4. What is your medication issue?   Patient stated that she has COPD and was told this medication may also affect her lungs.  Patient stated she would like to get alternate medication.

## 2022-12-13 MED ORDER — BISOPROLOL FUMARATE 5 MG PO TABS: 5.0000 mg | ORAL_TABLET | Freq: Every day | ORAL | 3 refills | Status: DC

## 2022-12-13 NOTE — Telephone Encounter (Signed)
Pt aware of dr Marlou Porch' order to d/c carvedilol and start bisoprolol 5 mg once a day.  Rx sent into [facility] as requested.

## 2022-12-13 NOTE — Telephone Encounter (Signed)
Pt calling back for an update. Please advise

## 2022-12-13 NOTE — Telephone Encounter (Signed)
Will forward to Dr Marlou Porch for his review and imput.

## 2022-12-15 DIAGNOSIS — J449 Chronic obstructive pulmonary disease, unspecified: Secondary | ICD-10-CM | POA: Diagnosis not present

## 2022-12-16 ENCOUNTER — Other Ambulatory Visit: Payer: Self-pay | Admitting: Adult Health

## 2022-12-16 DIAGNOSIS — J449 Chronic obstructive pulmonary disease, unspecified: Secondary | ICD-10-CM | POA: Diagnosis not present

## 2022-12-17 NOTE — Progress Notes (Signed)
COMMUNITY PALLIATIVE CARE SW NOTE  PATIENT NAME: [name] DOB: Mar 05, 1956 MRN: [id number]  PRIMARY CARE PROVIDER: Biagio Borg, MD  RESPONSIBLE PARTY:  Acct ID - Guarantor Home Phone Work Phone Relationship Acct Type  [id number] ELLEY, [phone] Self P/F     [address] [place], [place] [id number]   Initial Clinical SW Telephonic Encounter  PC SW completed an initial telephonic encounter with patient. SW provided education regarding the palliative care program, role in patient's care and, visit frequency and contact information. Patient expressed interest in the palliative care program. She reported that she has had a good week so far. She does experience SOB, fatigue and she is sleeping more. She reports that he eats at least 3 meals a day and a snack. She ambulates independently. She is on continuous o2 at 3L. Patient currently live with her sister, [name].  A follow-up appointment was scheduled with patient for 12/26/22 @ 1pm.   Social History   Tobacco Use   Smoking status: Some Days    Packs/day: 0.50    Years: 47.00    Total pack years: 23.50    Types: Cigarettes    Start date: 12/29/1971    Last attempt to quit: 05/22/2019    Years since quitting: 3.5   Smokeless tobacco: Never   Tobacco comments:    Smoking daily 1-2 day.  Stops for a while and then goes back.  08/29/2022 hfb  Substance Use Topics   Alcohol use: Yes    Alcohol/week: 5.0 - 6.0 standard drinks of alcohol    Types: 5 - 6 Glasses of wine per week    CODE STATUS: Full Code ADVANCED DIRECTIVES: No MOST FORM COMPLETE:  No HOSPICE EDUCATION PROVIDED: No  Duration of encounter and documentation: 30 minutes.  [address] [place], [place]

## 2022-12-26 ENCOUNTER — Other Ambulatory Visit: Payer: Medicare HMO

## 2022-12-26 VITALS — BP 110/50 | HR 70 | Temp 97.1°F

## 2022-12-26 DIAGNOSIS — Z515 Encounter for palliative care: Secondary | ICD-10-CM

## 2022-12-26 NOTE — Progress Notes (Signed)
PATIENT NAME: [name] DOB: 1956-03-15 MRN: [id number]  PRIMARY CARE PROVIDER: Biagio Borg, MD  RESPONSIBLE PARTY:  Acct ID - Guarantor Home Phone Work Phone Relationship Acct Type  [id number] VIRGA, [phone] Self P/F     [address] [place], [place] [address]    Palliative Care Initial Encounter Note   Completed home visit with Katheren Puller, SW. Sister [name] also present.     HISTORY OF PRESENT ILLNESS:    Respiratory: little SOB today; every now and then will take a deep breath; now has a non invasive ventilation machine; pt reports feeling as if there is something in her throat but not being able to bring it up; occasional productive cough noted  Cognitive: alert and oriented x4; reports better cognition since getting NIV machine   Appetite: eats 3 meals, snacks and more due to steroids  GI/GU: has occasional constipation; has several BMs per week    Mobility: walks unassisted but wants a transport wheelchair for outside activities  ADLs: able to clean her room and sometimes needs assist; sister does most of house cleaning; independent with personal grooming  Cardiac: reports CHF  Sleeping Pattern: sleeps throughout the night  Pain: 5/10 back pain  Wt: weighs self 2-3 times per week  Diabetes: FSBS 172 (1h post prandial); feels it's controlled with meds   Palliative Care/ Hospice: RN explained role and purpose of palliative care including visit frequency. Also discussed benefits of hospice care as well as the differences between the two with patient.   Goals of Care: To stay in the home    CODE STATUS: Full Code ADVANCED DIRECTIVES: N MOST FORM: N PPS: 90%   PHYSICAL EXAM:   VITALS: Today's Vitals   12/26/22 1256  BP: (!) 110/50  Pulse: 70  Temp: (!) 97.1 F (36.2 C)  SpO2: 97%  PainSc: 5   PainLoc: Back    LUNGS: decreased breath sounds, scattered rhonchi bilaterally CARDIAC: Cor RRR EXTREMITIES: no  edema SKIN: Skin color, texture, turgor normal. No rashes or lesions  NEURO: negative      Rod Majerus Georgann Housekeeper, LPN

## 2022-12-26 NOTE — Progress Notes (Signed)
COMMUNITY PALLIATIVE CARE SW NOTE  PATIENT NAME: MONNETTE MUELLNER DOB: 20-Jul-1956 MRN: [id number]  PRIMARY CARE PROVIDER: Biagio Borg, MD  RESPONSIBLE PARTY:  Acct ID - Guarantor Home Phone Work Phone Relationship Acct Type  [id number] VANYA, [id number] [phone] Self P/F     [address] [place], [place] [address]   Palliative Care Follow-up Visit/Clinical Social Work   SW and Nurse-D. Georgann Housekeeper completed a follow-up visit with patient at her home. Her sister-[name] was present with her. The team re-educated the family on palliative care services, role in patient's care, visit frequency and difference between palliative care and hospice. Patient was verbalized understanding and completed the consent form.  Patient is on continuous o2 via nasal cannula at 3L. Patient has Imogen machine that she uses when she goes out. Patient is alert and oriented x3. Patient reported that since she got her machine, she feels better, not tired or "weighted down" and is less confused. Patient is getting up earlier and is more awake. Patient report back pain in her upper back. She continues to experience SOB, fatigue and she is sleeping more. She reports that he eats at least 3 meals or more a day and snacks. Patient is having some constipation today, but is not often constipated. Patient last weight is 181 lbs. (12/10/22). She ambulates independently, but is easily winded. Her sister assist with house chores. Patient is independent for personal care needs and have a shower chair.    Goal of Care: Patient desires a transport wheelchair.  Social History   Tobacco Use   Smoking status: Some Days    Packs/day: 0.50    Years: 47.00    Total pack years: 23.50    Types: Cigarettes    Start date: 12/29/1971    Last attempt to quit: 05/22/2019    Years since quitting: 3.6   Smokeless tobacco: Never   Tobacco comments:    Smoking daily 1-2 day.  Stops for a while and then goes back.  08/29/2022 hfb   Substance Use Topics   Alcohol use: Yes    Alcohol/week: 5.0 - 6.0 standard drinks of alcohol    Types: 5 - 6 Glasses of wine per week    CODE STATUS: Full Code ADVANCED DIRECTIVES: No MOST FORM COMPLETE:  No HOSPICE EDUCATION PROVIDED: NO  Duration of visit and documentation: 60 minutes  Cherokee Boccio, LCSW

## 2023-01-03 ENCOUNTER — Ambulatory Visit (INDEPENDENT_AMBULATORY_CARE_PROVIDER_SITE_OTHER): Payer: Medicare HMO

## 2023-01-03 ENCOUNTER — Ambulatory Visit
Admission: EM | Admit: 2023-01-03 | Discharge: 2023-01-03 | Disposition: A | Discharge status: Home / Self Care | Payer: Medicare HMO | Attending: Family Medicine | Admitting: Family Medicine

## 2023-01-03 ENCOUNTER — Encounter: Payer: Self-pay | Admitting: Emergency Medicine

## 2023-01-03 DIAGNOSIS — W19XXXA Unspecified fall, initial encounter: Secondary | ICD-10-CM

## 2023-01-03 DIAGNOSIS — G90511 Complex regional pain syndrome I of right upper limb: Secondary | ICD-10-CM

## 2023-01-03 DIAGNOSIS — M79621 Pain in right upper arm: Secondary | ICD-10-CM

## 2023-01-03 MED ORDER — HYDROCODONE-ACETAMINOPHEN 5-325 MG PO TABS: 1.0000 | ORAL_TABLET | Freq: Four times a day (QID) | ORAL | 0 refills | Status: DC | PRN

## 2023-01-03 NOTE — Discharge Instructions (Signed)
Your x-ray did not show any broken bones.  There was some arthritis in your shoulder joint, and there was a possible loose body of bone near that joint.  Hydrocodone 5 mg--1 tablet every 6 hours as needed for pain.  This is best taken with food.  It can cause sleepiness or dizziness  Please follow-up with your primary care

## 2023-01-03 NOTE — ED Triage Notes (Signed)
Fall on outstretched right arm 5 days ago. Pt c/o right upper arm pain with numbness/tingling through entire arm. Pain worse with movement. Reports she's had weakness with right hand grip strength pain. Has been taking 2 advil for pain, last dose this morning.

## 2023-01-03 NOTE — ED Provider Notes (Signed)
[name]    CSN: [id number] Arrival date & time: 01/03/23  0805      History   Chief Complaint Chief Complaint  Patient presents with   Fall   Arm Injury    HPI [name] is a 66 y.o. female.    Fall  Arm Injury  Here for right upper arm pain.  5 days ago she was up in the night and it was dark.  She tripped over her oxygen tubing and fell onto her right hand.  Since then she has had pain from her right shoulder down to her elbow.  She also has some tingling in her right hand and forearm.  She is a little stiff up in her neck.  She did not hit her head or lose consciousness with this fall.  She was not dizzy    Past Medical History:  Diagnosis Date   Bronchitis    CAD (coronary artery disease)    a. 02/2002: CABG x3 with LIMA to LAD, RIMA to RCA, and SVG to OM   CHF (congestive heart failure) ([place])    COPD (chronic obstructive pulmonary disease) ([place])    Diabetes ([place])    Headache    Heart disease    Hyperlipidemia    Hypertension    Hypertensive emergency 07/01/2013   Impaired glucose tolerance 08/20/2014   Sinusitis     Patient Active Problem List   Diagnosis Date Noted   Estrogen deficiency 11/14/2022   COPD exacerbation ([place]) 11/07/2022   Pain, abdominal, RLQ 06/28/2022   Low back pain 03/14/2022   Constipation 03/14/2022   Tobacco abuse 07/11/2021   Abnormal mammogram 04/13/2021   Trichomonal vulvovaginitis 04/13/2021   Vaginal discharge 04/13/2021   Aortic atherosclerosis ([place]) 04/13/2021   Tracheomalacia 11/23/2020   Vitamin D deficiency 01/03/2020   HLD (hyperlipidemia) 01/03/2020   Acute pancreatitis 09/09/2019   Epigastric pain 09/06/2019   Back pain 09/06/2019   Eustachian tube dysfunction 05/18/2019   Insomnia 05/18/2019   Chronic respiratory failure with hypoxia and hypercapnia ([place]) 12/14/2018   Chronic obstructive lung disease ([place]) 03/18/2017   Former smoker 03/07/2017   Left lumbar radiculopathy 10/19/2015    Cough 08/29/2015   Pedal edema 02/22/2015   Diabetes ([place]) 08/20/2014   Pruritus 07/08/2014   Nocturnal hypoxemia 07/05/2014   Esophagus, foreign body 06/12/2014   Polycythemia, secondary 02/16/2014   5 mm Lung nodule, solitary 02/02/2014   GERD (gastroesophageal reflux disease) 02/01/2014   Hypersomnolence 02/01/2014   Allergic rhinitis 09/03/2013   CAD (coronary artery disease) 09/03/2013   Encounter for well adult exam with abnormal findings 09/03/2013   Essential hypertension 07/07/2013   COPD (chronic obstructive pulmonary disease) with chronic bronchitis ([place]) 07/07/2013   Status post coronary artery bypass grafting 07/07/2013   Headache 07/01/2013   Polycythemia 07/01/2013    Past Surgical History:  Procedure Laterality Date   CORONARY ARTERY BYPASS GRAFT  2003   Triple bypass   ESOPHAGOGASTRODUODENOSCOPY Left 06/13/2014   Procedure: ESOPHAGOGASTRODUODENOSCOPY (EGD);  Surgeon: Juanita Craver, MD;  Location: [facility] END[facility]COPY;  Service: Endoscopy;  Laterality: Left;   RIGID ESOPHAGOSCOPY N/A 06/12/2014   Procedure: RIGID ESOPHAGOSCOPY WITH FOREIGN BODY REMOVAL;  Surgeon: Ascencion Dike, MD;  Location: [place];  Service: ENT;  Laterality: N/A;    OB History   No obstetric history on file.      Home Medications    Prior to Admission medications   Medication Sig Start Date End Date Taking? Authorizing Provider  HYDROcodone-acetaminophen (  NORCO/VICODIN) 5-325 MG tablet Take 1 tablet by mouth every 6 (six) hours as needed (pain). 01/03/23  Yes Barrett Henle, MD  acetaminophen (TYLENOL) 500 MG tablet Take 1,000 mg by mouth 2 (two) times daily as needed for headache, fever, moderate pain or mild pain.    [provider]  albuterol (PROVENTIL) (2.5 MG/3ML) 0.083% nebulizer solution Take 3 mLs (2.5 mg total) by nebulization every 6 (six) hours as needed for wheezing or shortness of breath. 07/17/22   Rigoberto Noel, MD  albuterol (VENTOLIN HFA) 108 (90 Base) MCG/ACT inhaler  INHALE 1-2 PUFFS BY MOUTH EVERY 6 HOURS AS NEEDED FOR WHEEZE OR SHORTNESS OF BREATH 12/13/22   Rigoberto Noel, MD  amLODipine (NORVASC) 5 MG tablet Take 1 tablet (5 mg total) by mouth daily. Patient taking differently: Take 5 mg by mouth in the morning. 05/21/22   Jerline Pain, MD  atorvastatin (LIPITOR) 40 MG tablet TAKE 1 TABLET BY MOUTH EVERY DAY Patient taking differently: Take 40 mg by mouth in the morning. 05/07/22   Biagio Borg, MD  bisoprolol (ZEBETA) 5 MG tablet Take 1 tablet (5 mg total) by mouth daily. 12/13/22   Jerline Pain, MD  Cholecalciferol (VITAMIN D-3 PO) Take 1 capsule by mouth in the morning.    [provider]  Cyanocobalamin (VITAMIN B-12 PO) Take 1 tablet by mouth in the morning.    [provider]  Dulaglutide (TRULICITY) A999333 [id number] SOPN Inject 0.75 mg into the skin once a week. 01/23/22   Biagio Borg, MD  fluticasone (FLONASE) 50 MCG/ACT nasal spray Place 2 sprays into both nostrils daily. Patient taking differently: Place 2 sprays into both nostrils in the morning. 10/25/22   Binnie Rail, MD  furosemide (LASIX) 20 MG tablet Take 1 tablet (20 mg total) by mouth daily for 3 days. 11/09/22 11/12/22  George Hugh, MD  ipratropium-albuterol (DUONEB) 0.5-2.5 (3) MG/3ML SOLN Take 3 mLs by nebulization every 6 (six) hours as needed (wheezing, shortness of breath).    [provider]  isosorbide mononitrate (IMDUR) 60 MG 24 hr tablet Take 1.5 tablets (90 mg total) by mouth daily. Patient taking differently: Take 90 mg by mouth at bedtime. 05/23/22   Jerline Pain, MD  MAGNESIUM OXIDE PO Take 1 tablet by mouth at bedtime.    [provider]  metFORMIN (GLUCOPHAGE-XR) 500 MG 24 hr tablet TAKE 1 TABLET BY MOUTH EVERY DAY WITH BREAKFAST Patient taking differently: Take 500 mg by mouth daily with breakfast. 08/29/22   Biagio Borg, MD  Multiple Vitamins-Minerals (MULTIVITAMIN WOMEN 50+) TABS Take 1 tablet by mouth in the morning.    [provider]  predniSONE (DELTASONE) 10 MG tablet prednisone 10 mg tablets, take 1 to 2 tablets daily #60 12/10/22   Rigoberto Noel, MD  TRELEGY ELLIPTA 200-62.5-25 MCG/ACT AEPB TAKE 1 PUFF BY MOUTH EVERY DAY Patient taking differently: Inhale 1 puff into the lungs in the morning. 09/16/22   Martyn Ehrich, NP    Family History Family History  Problem Relation Age of Onset   Lung cancer Paternal Uncle    Heart disease Mother    Brain cancer Mother    Cancer Maternal Grandmother        colon   Stroke Other    Cancer Other        x 4 aunts   Lung cancer Maternal Aunt    Suicidality Brother     Social History Social  History   Tobacco Use   Smoking status: Some Days    Packs/day: 0.50    Years: 47.00    Additional pack years: 0.00    Total pack years: 23.50    Types: Cigarettes    Start date: 12/29/1971    Last attempt to quit: 05/22/2019    Years since quitting: 3.6   Smokeless tobacco: Never   Tobacco comments:    Smoking daily 1-2 day.  Stops for a while and then goes back.  08/29/2022 hfb  Substance Use Topics   Alcohol use: Yes    Alcohol/week: 5.0 - 6.0 standard drinks of alcohol    Types: 5 - 6 Glasses of wine per week   Drug use: Not Currently    Types: Marijuana    Comment: as a teenager     Allergies   Augmentin [amoxicillin-pot clavulanate] and Chantix [varenicline]   Review of Systems Review of Systems   Physical Exam Triage Vital Signs ED Triage Vitals  Enc Vitals Group     BP 01/03/23 0826 (!) 149/71     Pulse Rate 01/03/23 0826 75     Resp 01/03/23 0826 16     Temp 01/03/23 0826 98 F (36.7 C)     Temp Source 01/03/23 0826 Oral     SpO2 01/03/23 0826 97 %     Weight --      Height --      Head Circumference --      Peak Flow --      Pain Score 01/03/23 0829 5     Pain Loc --      Pain Edu? --      Excl. in [place]? --    No data found.  Updated Vital Signs BP (!) 149/71 (BP Location: Left Arm)   Pulse 75   Temp 98 F (36.7 C)  (Oral)   Resp 16   SpO2 97%   Visual Acuity Right Eye Distance:   Left Eye Distance:   Bilateral Distance:    Right Eye Near:   Left Eye Near:    Bilateral Near:     Physical Exam Vitals reviewed.  Constitutional:      General: She is not in acute distress.    Appearance: She is not ill-appearing, toxic-appearing or diaphoretic.  Cardiovascular:     Rate and Rhythm: Normal rate and regular rhythm.     Heart sounds: No murmur heard. Pulmonary:     Effort: Pulmonary effort is normal.     Comments: No rrw Musculoskeletal:     Comments: There is no tenderness of the right shoulder or arm.  She can abduct the right arm at the shoulder.  Skin:    Coloration: Skin is not pale.  Neurological:     General: No focal deficit present.     Mental Status: She is alert and oriented to person, place, and time.     Comments: Grip is normal on right hand.      UC Treatments / Results  Labs (all labs ordered are listed, but only abnormal results are displayed) Labs Reviewed - No data to display  EKG   Radiology DG Humerus Right  Result Date: 01/03/2023 CLINICAL DATA:  right upper arm pain since fell 5 days ago EXAM: RIGHT HUMERUS - 2+ VIEW COMPARISON:  None Available. FINDINGS: No right humerus fracture no evidence of dislocation at the right shoulder or right elbow on these views. No suspicious focal osseous lesions. Mild osteoarthritis in the  right acromioclavicular and right glenohumeral joints. Round 1 cm peripherally calcified structure in the soft tissues posterolateral to the right greater tuberosity, potentially a loose intra-articular body. IMPRESSION: 1. No right humerus fracture. 2. Mild right acromioclavicular and right glenohumeral joint osteoarthritis. 3. Round 1 cm peripherally calcified structure in the soft tissues posterolateral to the right greater tuberosity, potentially a loose intra-articular body. Electronically Signed   By: Ilona Sorrel M.D.   On: 01/03/2023 08:57     Procedures Procedures (including critical care time)  Medications Ordered in UC Medications - No data to display  Initial Impression / Assessment and Plan / UC Course  I have reviewed the triage vital signs and the nursing notes.  Pertinent labs & imaging results that were available during my care of the patient were reviewed by me and considered in my medical decision making (see chart for details).        X-rays do not show any fractures.  There is some degenerative change in the shoulder joint and there is a loose body possibly around the shoulder joint. Though her EGFR is normal, she does take prednisone daily.  Therefore a small supply of hydrocodone 5 mg is sent in for pain relief.  She will follow-up with her primary care and she is given contact information for orthopedics.  Final Clinical Impressions(s) / UC Diagnoses   Final diagnoses:  Complex regional pain syndrome type 1 affecting right upper arm     Discharge Instructions      Your x-ray did not show any broken bones.  There was some arthritis in your shoulder joint, and there was a possible loose body of bone near that joint.  Hydrocodone 5 mg--1 tablet every 6 hours as needed for pain.  This is best taken with food.  It can cause sleepiness or dizziness  Please follow-up with your primary care     ED Prescriptions     Medication Sig Dispense Auth. Provider   HYDROcodone-acetaminophen (NORCO/VICODIN) 5-325 MG tablet Take 1 tablet by mouth every 6 (six) hours as needed (pain). 12 tablet Michaell Grider, Gwenlyn Perking, MD      I have reviewed the PDMP during this encounter.   Barrett Henle, MD 01/03/23 [phone]

## 2023-01-11 ENCOUNTER — Other Ambulatory Visit: Payer: Self-pay | Admitting: Primary Care

## 2023-01-11 ENCOUNTER — Other Ambulatory Visit: Payer: Self-pay | Admitting: Internal Medicine

## 2023-01-13 ENCOUNTER — Encounter: Payer: Self-pay | Admitting: Internal Medicine

## 2023-01-13 ENCOUNTER — Ambulatory Visit: Payer: Medicare HMO | Admitting: Internal Medicine

## 2023-01-13 ENCOUNTER — Ambulatory Visit (INDEPENDENT_AMBULATORY_CARE_PROVIDER_SITE_OTHER): Payer: Medicare HMO | Admitting: Internal Medicine

## 2023-01-13 VITALS — BP 122/68 | HR 76 | Temp 98.1°F | Ht 61.0 in | Wt 190.0 lb

## 2023-01-13 DIAGNOSIS — E1165 Type 2 diabetes mellitus with hyperglycemia: Secondary | ICD-10-CM | POA: Diagnosis not present

## 2023-01-13 DIAGNOSIS — I10 Essential (primary) hypertension: Secondary | ICD-10-CM | POA: Diagnosis not present

## 2023-01-13 DIAGNOSIS — E785 Hyperlipidemia, unspecified: Secondary | ICD-10-CM | POA: Diagnosis not present

## 2023-01-13 DIAGNOSIS — J449 Chronic obstructive pulmonary disease, unspecified: Secondary | ICD-10-CM | POA: Diagnosis not present

## 2023-01-13 LAB — POCT GLYCOSYLATED HEMOGLOBIN (HGB A1C): Hemoglobin A1C: 7.8 % — AB (ref 4.0–5.6)

## 2023-01-13 MED ORDER — TIRZEPATIDE 2.5 MG/0.5ML ~~LOC~~ SOAJ: 2.5000 mg | SUBCUTANEOUS | 11 refills | Status: DC

## 2023-01-13 NOTE — Patient Instructions (Addendum)
Some people with insurance will be eligible for [employer], which starts at $25 for a 1- or 81-month prescription - [employer]   Your A1c was done today  Please take all new medication as prescribed - the mounjaro if ok with insurance (and stop the trulicity)  Please continue all other medications as before, and refills have been done if requested.  Please have the pharmacy call with any other refills you may need.  Please keep your appointments with your specialists as you may have planned  Please make an Appointment to return in Apr 24, or sooner if needed

## 2023-01-13 NOTE — Progress Notes (Signed)
Patient ID: [name], female   DOB: 11-Feb-1956, 67 y.o.   MRN: [id number]        Chief Complaint: follow up HTN, HLD and DM, obesity       HPI:  [name] is a 67 y.o. female here with c/o persistent wt gain, admits to poor diet choices and increased calories with prednisone;  Pt denies chest pain, increased sob or doe, wheezing, orthopnea, PND, increased LE swelling, palpitations, dizziness or syncope.   Pt denies polydipsia, polyuria, or new focal neuro s/s.    Pt denies fever, wt loss, night sweats, loss of appetite, or other constitutional symptoms  still smoking not ready to quit.  Taking trulicity through [employer] patient assist, but no wt loss with this.       Wt Readings from Last 3 Encounters:  01/13/23 190 lb (86.2 kg)  12/10/22 181 lb 6.4 oz (82.3 kg)  11/14/22 185 lb (83.9 kg)   BP Readings from Last 3 Encounters:  01/13/23 122/68  01/03/23 (!) 149/71  12/26/22 (!) 110/50         Past Medical History:  Diagnosis Date   Bronchitis    CAD (coronary artery disease)    a. 02/2002: CABG x3 with LIMA to LAD, RIMA to RCA, and SVG to OM   CHF (congestive heart failure) ([place])    COPD (chronic obstructive pulmonary disease) ([place])    Diabetes ([place])    Headache    Heart disease    Hyperlipidemia    Hypertension    Hypertensive emergency 07/01/2013   Impaired glucose tolerance 08/20/2014   Sinusitis    Past Surgical History:  Procedure Laterality Date   CORONARY ARTERY BYPASS GRAFT  2003   Triple bypass   ESOPHAGOGASTRODUODENOSCOPY Left 06/13/2014   Procedure: ESOPHAGOGASTRODUODENOSCOPY (EGD);  Surgeon: Juanita Craver, MD;  Location: [facility] ENDOSCOPY;  Service: Endoscopy;  Laterality: Left;   RIGID ESOPHAGOSCOPY N/A 06/12/2014   Procedure: RIGID ESOPHAGOSCOPY WITH FOREIGN BODY REMOVAL;  Surgeon: Ascencion Dike, MD;  Location: [place];  Service: ENT;  Laterality: N/A;    reports that she has been smoking cigarettes. She started smoking about 51 years ago. She has a 23.50  pack-year smoking history. She has never used smokeless tobacco. She reports current alcohol use of about 5.0 - 6.0 standard drinks of alcohol per week. She reports that she does not currently use drugs after having used the following drugs: Marijuana. family history includes Brain cancer in her mother; Cancer in her maternal grandmother and another family member; Heart disease in her mother; Lung cancer in her maternal aunt and paternal uncle; Stroke in an other family member; Suicidality in her brother. Allergies  Allergen Reactions   Augmentin [Amoxicillin-Pot Clavulanate] Nausea And Vomiting   Chantix [Varenicline] Nausea And Vomiting   Current Outpatient Medications on File Prior to Visit  Medication Sig Dispense Refill   acetaminophen (TYLENOL) 500 MG tablet Take 1,000 mg by mouth 2 (two) times daily as needed for headache, fever, moderate pain or mild pain.     albuterol (PROVENTIL) (2.5 MG/3ML) 0.083% nebulizer solution Take 3 mLs (2.5 mg total) by nebulization every 6 (six) hours as needed for wheezing or shortness of breath. 75 mL 12   albuterol (VENTOLIN HFA) 108 (90 Base) MCG/ACT inhaler INHALE 1-2 PUFFS BY MOUTH EVERY 6 HOURS AS NEEDED FOR WHEEZE OR SHORTNESS OF BREATH 8.5 each 5   amLODipine (NORVASC) 5 MG tablet Take 1 tablet (5 mg total) by mouth daily. (Patient  taking differently: Take 5 mg by mouth in the morning.) 90 tablet 3   atorvastatin (LIPITOR) 40 MG tablet TAKE 1 TABLET BY MOUTH EVERY DAY (Patient taking differently: Take 40 mg by mouth in the morning.) 90 tablet 3   bisoprolol (ZEBETA) 5 MG tablet Take 1 tablet (5 mg total) by mouth daily. 90 tablet 3   Cholecalciferol (VITAMIN D-3 PO) Take 1 capsule by mouth in the morning.     Cyanocobalamin (VITAMIN B-12 PO) Take 1 tablet by mouth in the morning.     Dulaglutide (TRULICITY) A999333 [id number] SOPN Inject 0.75 mg into the skin once a week. 6 mL 0   fluticasone (FLONASE) 50 MCG/ACT nasal spray Place 2 sprays into both  nostrils daily. (Patient taking differently: Place 2 sprays into both nostrils in the morning.) 16 g 6   HYDROcodone-acetaminophen (NORCO/VICODIN) 5-325 MG tablet Take 1 tablet by mouth every 6 (six) hours as needed (pain). 12 tablet 0   ipratropium-albuterol (DUONEB) 0.5-2.5 (3) MG/3ML SOLN Take 3 mLs by nebulization every 6 (six) hours as needed (wheezing, shortness of breath).     isosorbide mononitrate (IMDUR) 60 MG 24 hr tablet Take 1.5 tablets (90 mg total) by mouth daily. (Patient taking differently: Take 90 mg by mouth at bedtime.) 135 tablet 3   MAGNESIUM OXIDE PO Take 1 tablet by mouth at bedtime.     Multiple Vitamins-Minerals (MULTIVITAMIN WOMEN 50+) TABS Take 1 tablet by mouth in the morning.     predniSONE (DELTASONE) 10 MG tablet prednisone 10 mg tablets, take 1 to 2 tablets daily #60 20 tablet 2   TRELEGY ELLIPTA 200-62.5-25 MCG/ACT AEPB TAKE 1 PUFF BY MOUTH EVERY DAY (Patient taking differently: Inhale 1 puff into the lungs in the morning.) 60 each 4   furosemide (LASIX) 20 MG tablet Take 1 tablet (20 mg total) by mouth daily for 3 days. 3 tablet 0   metFORMIN (GLUCOPHAGE-XR) 500 MG 24 hr tablet TAKE 1 TABLET BY MOUTH EVERY DAY WITH BREAKFAST 90 tablet 2   Current Facility-Administered Medications on File Prior to Visit  Medication Dose Route Frequency Provider Last Rate Last Admin   nicotine (NICOTROL) 10 MG inhaler 1 continuous puffing  1 continuous puffing Inhalation PRN Rigoberto Noel, MD       regadenoson (LEXISCAN) injection SOLN 0.4 mg  0.4 mg Intravenous Once Fay Records, MD       technetium tetrofosmin (TC-MYOVIEW) injection 30 millicurie  30 millicurie Intravenous Once PRN Fay Records, MD            ROS:  All others reviewed and negative.  Objective        PE:  BP 122/68   Pulse 76   Temp 98.1 F (36.7 C) (Oral)   Ht 5\' 1"  (1.549 m)   Wt 190 lb (86.2 kg)   SpO2 96%   BMI 35.90 kg/m                 Constitutional: Pt appears in NAD               HENT:  Head: NCAT.                Right Ear: External ear normal.                 Left Ear: External ear normal.                Eyes: . Pupils are equal, round, and reactive to light.  Conjunctivae and EOM are normal               Nose: without d/c or deformity               Neck: Neck supple. Gross normal ROM               Cardiovascular: Normal rate and regular rhythm.                 Pulmonary/Chest: Effort normal and breath sounds without rales or wheezing.                Abd:  Soft, NT, ND, + BS, no organomegaly               Neurological: Pt is alert. At baseline orientation, motor grossly intact               Skin: Skin is warm. No rashes, no other new lesions, LE edema - none               Psychiatric: Pt behavior is normal without agitation   Micro: none  Cardiac tracings I have personally interpreted today:  none  Pertinent Radiological findings (summarize): none   Lab Results  Component Value Date   WBC 10.8 (H) 11/09/2022   HGB 13.3 11/09/2022   HCT 44.1 11/09/2022   PLT 162 11/09/2022   GLUCOSE 166 (H) 11/09/2022   CHOL 171 06/28/2022   TRIG 235.0 (H) 06/28/2022   HDL 57.40 06/28/2022   LDLDIRECT 79.0 06/28/2022   LDLCALC 66 03/07/2022   ALT 27 11/08/2022   AST 19 11/08/2022   NA 143 11/09/2022   K 4.9 11/09/2022   CL 94 (L) 11/09/2022   CREATININE 0.37 (L) 11/09/2022   BUN 11 11/09/2022   CO2 41 (H) 11/09/2022   TSH 4.09 03/07/2022   HGBA1C 7.8 (A) 01/13/2023   MICROALBUR 8.7 (H) 03/07/2022   Hemoglobin A1C 4.0 - 5.6 % 7.8 Abnormal  7.1 Abnormal  7.0 High  R, CM 6.6 High  R, CM 6.3 R   Assessment/Plan:  [name] is a 67 y.o. Other or two or more races [6] female with  has a past medical history of Bronchitis, CAD (coronary artery disease), CHF (congestive heart failure) ([place]), COPD (chronic obstructive pulmonary disease) ([place]), Diabetes ([place]), Headache, Heart disease, Hyperlipidemia, Hypertension, Hypertensive emergency (07/01/2013), Impaired glucose  tolerance (08/20/2014), and Sinusitis.  Diabetes ([place]) Worsening overall, ok for change trulicity to mounjaro 2.5 mg weekly if ok with insurance or pt assist program,  to f/u any worsening symptoms or concerns  Essential hypertension BP Readings from Last 3 Encounters:  01/13/23 122/68  01/03/23 (!) 149/71  12/26/22 (!) 110/50   Stable, pt to continue medical treatment norvasc 5 mg qd, zebeta 5 mg qd   HLD (hyperlipidemia) Lab Results  Component Value Date   LDLCALC 66 03/07/2022   Stable, pt to continue current statin liptior 40 mg qd  Followup: Return in about 30 days (around 02/12/2023).  Cathlean Cower, MD 01/13/2023 9:09 PM [facility]

## 2023-01-13 NOTE — Assessment & Plan Note (Signed)
BP Readings from Last 3 Encounters:  01/13/23 122/68  01/03/23 (!) 149/71  12/26/22 (!) 110/50   Stable, pt to continue medical treatment norvasc 5 mg qd, zebeta 5 mg qd

## 2023-01-13 NOTE — Assessment & Plan Note (Signed)
Lab Results  Component Value Date   LDLCALC 66 03/07/2022   Stable, pt to continue current statin liptior 40 mg qd

## 2023-01-13 NOTE — Assessment & Plan Note (Signed)
Worsening overall, ok for change trulicity to mounjaro 2.5 mg weekly if ok with insurance or pt assist program,  to f/u any worsening symptoms or concerns

## 2023-01-14 DIAGNOSIS — J449 Chronic obstructive pulmonary disease, unspecified: Secondary | ICD-10-CM | POA: Diagnosis not present

## 2023-02-06 ENCOUNTER — Other Ambulatory Visit: Payer: Medicare HMO

## 2023-02-06 VITALS — BP 120/62 | HR 82 | Temp 96.4°F

## 2023-02-06 DIAGNOSIS — Z515 Encounter for palliative care: Secondary | ICD-10-CM

## 2023-02-06 NOTE — Progress Notes (Signed)
PATIENT NAME: [name] DOB: 15-Oct-1956 MRN: [id number]  PRIMARY CARE PROVIDER: Corwin Levins, MD  RESPONSIBLE PARTY:  Acct ID - Guarantor Home Phone Work Phone Relationship Acct Type  [id number] NIDHI, JACOME* [phone] Self P/F     [address] [place], [place] [address]    Palliative Care Follow Up Encounter Note    Completed home visit.      Today's Visit:     Respiratory: little SOB today; every now and then will take a deep breath; now has a non invasive ventilation machine is working well because she feels she is rested when she awakens in the morning; no longer has an occasional productive cough   Cognitive: alert and oriented x4; reports better cognition since getting NIV machine   Appetite: eats 2 full meals, snacks and more due to steroids   GI/GU: still has occasional constipation; has several BMs per week; no urinary issues   Mobility: walks unassisted and still wants a transport wheelchair for outside activities   ADLs: able to clean her room and sometimes needs assist; sister does most of house cleaning; independent with personal grooming   Cardiac: reports CHF; takes her fluid pill but when she doesn't she get edema to BLE; gave CHF booklet and discussed weight gain   Sleeping Pattern: sleeps throughout the night   Pain: denies pain at this time   Wt: 185 lbs on 02/05/23   Diabetes: FSBS 121 1h post prandial); still feels it's controlled with meds    Palliative Care/ Hospice: RN explained role and purpose of palliative care including visit frequency. Also discussed benefits of hospice care as well as the differences between the two with patient.    Goals of Care: To stay in the home     CODE STATUS: Full Code ADVANCED DIRECTIVES: N MOST FORM: N PPS: 90%   PHYSICAL EXAM:   VITALS: Today's Vitals   02/06/23 1303  BP: 120/62  Pulse: 82  Temp: (!) 96.4 F (35.8 C)  TempSrc: Temporal  SpO2: 96%  PainSc: 0-No pain     LUNGS: decreased breath sounds CARDIAC: Cor RRR EXTREMITIES: normal SKIN: Skin color, texture, turgor normal. No rashes or lesions  NEURO: normal    Paislyn Domenico Clementeen Graham, LPN

## 2023-02-12 ENCOUNTER — Encounter: Payer: Medicare HMO | Admitting: Internal Medicine

## 2023-02-13 DIAGNOSIS — J449 Chronic obstructive pulmonary disease, unspecified: Secondary | ICD-10-CM | POA: Diagnosis not present

## 2023-02-14 DIAGNOSIS — J449 Chronic obstructive pulmonary disease, unspecified: Secondary | ICD-10-CM | POA: Diagnosis not present

## 2023-02-17 ENCOUNTER — Ambulatory Visit (INDEPENDENT_AMBULATORY_CARE_PROVIDER_SITE_OTHER): Payer: Medicare HMO

## 2023-02-17 ENCOUNTER — Encounter: Payer: Self-pay | Admitting: Internal Medicine

## 2023-02-17 ENCOUNTER — Ambulatory Visit (HOSPITAL_BASED_OUTPATIENT_CLINIC_OR_DEPARTMENT_OTHER): Payer: Medicare HMO

## 2023-02-17 ENCOUNTER — Encounter (HOSPITAL_BASED_OUTPATIENT_CLINIC_OR_DEPARTMENT_OTHER): Payer: Self-pay | Admitting: Pulmonary Disease

## 2023-02-17 ENCOUNTER — Ambulatory Visit (INDEPENDENT_AMBULATORY_CARE_PROVIDER_SITE_OTHER): Payer: Medicare HMO | Admitting: Internal Medicine

## 2023-02-17 ENCOUNTER — Ambulatory Visit (INDEPENDENT_AMBULATORY_CARE_PROVIDER_SITE_OTHER): Payer: Medicare HMO | Admitting: Pulmonary Disease

## 2023-02-17 VITALS — BP 120/60 | HR 63 | Temp 98.3°F | Ht 61.0 in | Wt 189.4 lb

## 2023-02-17 VITALS — BP 130/78 | HR 67 | Temp 98.6°F | Ht 61.0 in | Wt 189.0 lb

## 2023-02-17 DIAGNOSIS — M5136 Other intervertebral disc degeneration, lumbar region: Secondary | ICD-10-CM | POA: Diagnosis not present

## 2023-02-17 DIAGNOSIS — K760 Fatty (change of) liver, not elsewhere classified: Secondary | ICD-10-CM | POA: Insufficient documentation

## 2023-02-17 DIAGNOSIS — M545 Low back pain, unspecified: Secondary | ICD-10-CM

## 2023-02-17 DIAGNOSIS — E669 Obesity, unspecified: Secondary | ICD-10-CM | POA: Insufficient documentation

## 2023-02-17 DIAGNOSIS — E559 Vitamin D deficiency, unspecified: Secondary | ICD-10-CM | POA: Diagnosis not present

## 2023-02-17 DIAGNOSIS — R109 Unspecified abdominal pain: Secondary | ICD-10-CM

## 2023-02-17 DIAGNOSIS — R059 Cough, unspecified: Secondary | ICD-10-CM | POA: Diagnosis not present

## 2023-02-17 DIAGNOSIS — Z720 Tobacco use: Secondary | ICD-10-CM | POA: Diagnosis not present

## 2023-02-17 DIAGNOSIS — Z800 Family history of malignant neoplasm of digestive organs: Secondary | ICD-10-CM | POA: Insufficient documentation

## 2023-02-17 DIAGNOSIS — J9611 Chronic respiratory failure with hypoxia: Secondary | ICD-10-CM | POA: Diagnosis not present

## 2023-02-17 DIAGNOSIS — K5904 Chronic idiopathic constipation: Secondary | ICD-10-CM | POA: Insufficient documentation

## 2023-02-17 DIAGNOSIS — R933 Abnormal findings on diagnostic imaging of other parts of digestive tract: Secondary | ICD-10-CM | POA: Insufficient documentation

## 2023-02-17 DIAGNOSIS — E538 Deficiency of other specified B group vitamins: Secondary | ICD-10-CM | POA: Diagnosis not present

## 2023-02-17 DIAGNOSIS — J9612 Chronic respiratory failure with hypercapnia: Secondary | ICD-10-CM | POA: Diagnosis not present

## 2023-02-17 DIAGNOSIS — E785 Hyperlipidemia, unspecified: Secondary | ICD-10-CM

## 2023-02-17 DIAGNOSIS — E1165 Type 2 diabetes mellitus with hyperglycemia: Secondary | ICD-10-CM

## 2023-02-17 DIAGNOSIS — J441 Chronic obstructive pulmonary disease with (acute) exacerbation: Secondary | ICD-10-CM | POA: Diagnosis not present

## 2023-02-17 DIAGNOSIS — I10 Essential (primary) hypertension: Secondary | ICD-10-CM | POA: Diagnosis not present

## 2023-02-17 DIAGNOSIS — M546 Pain in thoracic spine: Secondary | ICD-10-CM | POA: Diagnosis not present

## 2023-02-17 DIAGNOSIS — F1010 Alcohol abuse, uncomplicated: Secondary | ICD-10-CM | POA: Insufficient documentation

## 2023-02-17 LAB — CBC WITH DIFFERENTIAL/PLATELET
Basophils Absolute: 0 10*3/uL (ref 0.0–0.1)
Basophils Relative: 0.1 % (ref 0.0–3.0)
Eosinophils Absolute: 0 10*3/uL (ref 0.0–0.7)
Eosinophils Relative: 0.1 % (ref 0.0–5.0)
HCT: 41.8 % (ref 36.0–46.0)
Hemoglobin: 13.4 g/dL (ref 12.0–15.0)
Lymphocytes Relative: 12.1 % (ref 12.0–46.0)
Lymphs Abs: 1.5 10*3/uL (ref 0.7–4.0)
MCHC: 32 g/dL (ref 30.0–36.0)
MCV: 85.7 fl (ref 78.0–100.0)
Monocytes Absolute: 0.2 10*3/uL (ref 0.1–1.0)
Monocytes Relative: 1.4 % — ABNORMAL LOW (ref 3.0–12.0)
Neutro Abs: 10.4 10*3/uL — ABNORMAL HIGH (ref 1.4–7.7)
Neutrophils Relative %: 86.7 % — ABNORMAL HIGH (ref 43.0–77.0)
Platelets: 230 10*3/uL (ref 150.0–400.0)
RBC: 4.87 Mil/uL (ref 3.87–5.11)
RDW: 14.4 % (ref 11.5–15.5)
WBC: 12 10*3/uL — ABNORMAL HIGH (ref 4.0–10.5)

## 2023-02-17 LAB — MICROALBUMIN / CREATININE URINE RATIO
Creatinine,U: 86 mg/dL
Microalb Creat Ratio: 7 mg/g (ref 0.0–30.0)
Microalb, Ur: 6 mg/dL — ABNORMAL HIGH (ref 0.0–1.9)

## 2023-02-17 LAB — HEPATIC FUNCTION PANEL
ALT: 26 U/L (ref 0–35)
AST: 20 U/L (ref 0–37)
Albumin: 4.3 g/dL (ref 3.5–5.2)
Alkaline Phosphatase: 99 U/L (ref 39–117)
Bilirubin, Direct: 0 mg/dL (ref 0.0–0.3)
Total Bilirubin: 0.3 mg/dL (ref 0.2–1.2)
Total Protein: 7.2 g/dL (ref 6.0–8.3)

## 2023-02-17 LAB — LIPID PANEL
Cholesterol: 182 mg/dL (ref 0–200)
HDL: 47.2 mg/dL (ref >39.00)
LDL Cholesterol: 110 mg/dL — ABNORMAL HIGH (ref 0–99)
NonHDL: 134.65
Total CHOL/HDL Ratio: 4
Triglycerides: 124 mg/dL (ref 0.0–149.0)
VLDL: 24.8 mg/dL (ref 0.0–40.0)

## 2023-02-17 LAB — VITAMIN B12: Vitamin B-12: >1500 pg/mL — ABNORMAL HIGH (ref 211–911)

## 2023-02-17 LAB — TSH: TSH: 0.6 u[IU]/mL (ref 0.35–5.50)

## 2023-02-17 LAB — BASIC METABOLIC PANEL
BUN: 14 mg/dL (ref 6–23)
CO2: 37 mEq/L — ABNORMAL HIGH (ref 19–32)
Calcium: 9.8 mg/dL (ref 8.4–10.5)
Chloride: 92 mEq/L — ABNORMAL LOW (ref 96–112)
Creatinine, Ser: 0.54 mg/dL (ref 0.40–1.20)
GFR: 95.4 mL/min (ref >60.00)
Glucose, Bld: 160 mg/dL — ABNORMAL HIGH (ref 70–99)
Potassium: 4.1 mEq/L (ref 3.5–5.1)
Sodium: 139 mEq/L (ref 135–145)

## 2023-02-17 LAB — VITAMIN D 25 HYDROXY (VIT D DEFICIENCY, FRACTURES): VITD: 24.6 ng/mL — ABNORMAL LOW (ref 30.00–100.00)

## 2023-02-17 LAB — HEMOGLOBIN A1C: Hgb A1c MFr Bld: 7.6 % — ABNORMAL HIGH (ref 4.6–6.5)

## 2023-02-17 MED ORDER — OXYCODONE-ACETAMINOPHEN 5-325 MG PO TABS: 1.0000 | ORAL_TABLET | Freq: Four times a day (QID) | ORAL | 0 refills | Status: DC | PRN

## 2023-02-17 MED ORDER — PREDNISONE 10 MG PO TABS: ORAL_TABLET | ORAL | 2 refills | Status: DC

## 2023-02-17 MED ORDER — PREDNISONE 10 MG PO TABS: 5.0000 mg | ORAL_TABLET | Freq: Every day | ORAL | 1 refills | Status: DC

## 2023-02-17 MED ORDER — TIRZEPATIDE 5 MG/0.5ML ~~LOC~~ SOAJ: 5.0000 mg | SUBCUTANEOUS | 11 refills | Status: DC

## 2023-02-17 NOTE — Assessment & Plan Note (Signed)
Lab Results  Component Value Date   HGBA1C 7.8 (A) 01/13/2023   Uncontrolled now tolerating mounjaro 2.5 mg, willing for increase to 5 mg, pt to continue current medical treatment metformin ER 500 - 1 qd

## 2023-02-17 NOTE — Assessment & Plan Note (Signed)
I suspect msk spasm, for pain control, also for t spine and ls spine plain films

## 2023-02-17 NOTE — Patient Instructions (Signed)
X CXR today for right-sided pleuritic chest pain  X refill on prednisone 10 mg Additional prescription for prednisone 5 mg # 30 x 1 refill  Start using prednisone 5 mg every Monday/Wednesday/Friday starting May 15

## 2023-02-17 NOTE — Assessment & Plan Note (Signed)
Last vitamin D Lab Results  Component Value Date   VD25OH 22.17 (L) 06/28/2022   Low, to start oral replacement  

## 2023-02-17 NOTE — Progress Notes (Signed)
Patient ID: [name], female   DOB: 1956/09/13, 67 y.o.   MRN: [id number]        Chief Complaint: follow up right flank and low back pain, dm, htn, smoker, low vit d       HPI:  [name] is a 67 y.o. female here with c/o persistent rght flank and lower back pain thoracic pain without significant improvement still occasionally 10/10, worse to move about or stand up or twist at the waist,  Denies worsening reflux, abd pain, dysphagia, n/v, bowel change or blood  Denies urinary symptoms such as dysuria, frequency, urgency, flank pain, hematuria or n/v, fever, chills.  Still smoking, not ready to quit.       Wt Readings from Last 3 Encounters:  02/17/23 189 lb (85.7 kg)  02/17/23 189 lb 6.4 oz (85.9 kg)  01/13/23 190 lb (86.2 kg)   BP Readings from Last 3 Encounters:  02/17/23 130/78  02/17/23 120/60  02/06/23 120/62         Past Medical History:  Diagnosis Date   Bronchitis    CAD (coronary artery disease)    a. 02/2002: CABG x3 with LIMA to LAD, RIMA to RCA, and SVG to OM   CHF (congestive heart failure) ([facility])    COPD (chronic obstructive pulmonary disease) ([facility])    Diabetes ([facility])    Headache    Heart disease    Hyperlipidemia    Hypertension    Hypertensive emergency 07/01/2013   Impaired glucose tolerance 08/20/2014   Sinusitis    Past Surgical History:  Procedure Laterality Date   CORONARY ARTERY BYPASS GRAFT  2003   Triple bypass   ESOPHAGOGASTRODUODENOSCOPY Left 06/13/2014   Procedure: ESOPHAGOGASTRODUODENOSCOPY (EGD);  Surgeon: Charna Elizabeth, MD;  Location: [facility] ENDOSCOPY;  Service: Endoscopy;  Laterality: Left;   RIGID ESOPHAGOSCOPY N/A 06/12/2014   Procedure: RIGID ESOPHAGOSCOPY WITH FOREIGN BODY REMOVAL;  Surgeon: Darletta Moll, MD;  Location: [facility] OR;  Service: ENT;  Laterality: N/A;    reports that she has been smoking cigarettes. She started smoking about 51 years ago. She has a 23.50 pack-year smoking history. She has never used smokeless tobacco. She reports  current alcohol use of about 5.0 - 6.0 standard drinks of alcohol per week. She reports that she does not currently use drugs after having used the following drugs: Marijuana. family history includes Brain cancer in her mother; Cancer in her maternal grandmother and another family member; Heart disease in her mother; Lung cancer in her maternal aunt and paternal uncle; Stroke in an other family member; Suicidality in her brother. Allergies  Allergen Reactions   Augmentin [Amoxicillin-Pot Clavulanate] Nausea And Vomiting   Chantix [Varenicline] Nausea And Vomiting   Current Outpatient Medications on File Prior to Visit  Medication Sig Dispense Refill   acetaminophen (TYLENOL) 500 MG tablet Take 1,000 mg by mouth 2 (two) times daily as needed for headache, fever, moderate pain or mild pain.     albuterol (PROVENTIL) (2.5 MG/3ML) 0.083% nebulizer solution Take 3 mLs (2.5 mg total) by nebulization every 6 (six) hours as needed for wheezing or shortness of breath. 75 mL 12   albuterol (VENTOLIN HFA) 108 (90 Base) MCG/ACT inhaler INHALE 1-2 PUFFS BY MOUTH EVERY 6 HOURS AS NEEDED FOR WHEEZE OR SHORTNESS OF BREATH 8.5 each 5   amLODipine (NORVASC) 5 MG tablet Take 1 tablet (5 mg total) by mouth daily. (Patient taking differently: Take 5 mg by mouth in the morning.) 90 tablet 3  atorvastatin (LIPITOR) 40 MG tablet TAKE 1 TABLET BY MOUTH EVERY DAY (Patient taking differently: Take 40 mg by mouth in the morning.) 90 tablet 3   bisoprolol (ZEBETA) 5 MG tablet Take 1 tablet (5 mg total) by mouth daily. 90 tablet 3   Cholecalciferol (VITAMIN D-3 PO) Take 1 capsule by mouth in the morning.     Cyanocobalamin (VITAMIN B-12 PO) Take 1 tablet by mouth in the morning.     Dulaglutide (TRULICITY) 0.75 MG/0.5ML SOPN Inject 0.75 mg into the skin once a week. 6 mL 0   fluticasone (FLONASE) 50 MCG/ACT nasal spray Place 2 sprays into both nostrils daily. (Patient taking differently: Place 2 sprays into both nostrils in  the morning.) 16 g 6   ipratropium-albuterol (DUONEB) 0.5-2.5 (3) MG/3ML SOLN Take 3 mLs by nebulization every 6 (six) hours as needed (wheezing, shortness of breath).     isosorbide mononitrate (IMDUR) 60 MG 24 hr tablet Take 1.5 tablets (90 mg total) by mouth daily. (Patient taking differently: Take 90 mg by mouth at bedtime.) 135 tablet 3   MAGNESIUM OXIDE PO Take 1 tablet by mouth at bedtime.     metFORMIN (GLUCOPHAGE-XR) 500 MG 24 hr tablet TAKE 1 TABLET BY MOUTH EVERY DAY WITH BREAKFAST 90 tablet 2   Multiple Vitamins-Minerals (MULTIVITAMIN WOMEN 50+) TABS Take 1 tablet by mouth in the morning.     predniSONE (DELTASONE) 10 MG tablet prednisone 10 mg tablets, take 1 to 2 tablets daily #60 20 tablet 2   predniSONE (DELTASONE) 10 MG tablet Take 0.5 tablets (5 mg total) by mouth daily with breakfast. 30 tablet 1   TRELEGY ELLIPTA 200-62.5-25 MCG/ACT AEPB INHALE 1 PUFF BY MOUTH EVERY DAY 60 each 4   furosemide (LASIX) 20 MG tablet Take 1 tablet (20 mg total) by mouth daily for 3 days. 3 tablet 0   Current Facility-Administered Medications on File Prior to Visit  Medication Dose Route Frequency Provider Last Rate Last Admin   nicotine (NICOTROL) 10 MG inhaler 1 continuous puffing  1 continuous puffing Inhalation PRN Oretha Milch, MD       regadenoson (LEXISCAN) injection SOLN 0.4 mg  0.4 mg Intravenous Once Pricilla Riffle, MD       technetium tetrofosmin (TC-MYOVIEW) injection 30 millicurie  30 millicurie Intravenous Once PRN Pricilla Riffle, MD            ROS:  All others reviewed and negative.  Objective        PE:  BP 130/78 (BP Location: Right Arm, Patient Position: Sitting, Cuff Size: Normal)   Pulse 67   Temp 98.6 F (37 C) (Oral)   Ht 5\' 1"  (1.549 m)   Wt 189 lb (85.7 kg)   SpO2 98%   BMI 35.71 kg/m                 Constitutional: Pt appears in NAD               HENT: Head: NCAT.                Right Ear: External ear normal.                 Left Ear: External ear normal.                 Eyes: . Pupils are equal, round, and reactive to light. Conjunctivae and EOM are normal  Nose: without d/c or deformity               Neck: Neck supple. Gross normal ROM               Cardiovascular: Normal rate and regular rhythm.                 Pulmonary/Chest: Effort normal and breath sounds without rales or wheezing.                Abd:  Soft, NT, ND, + BS, no organomegaly but has mod right flank tedner and muscular spasm               Neurological: Pt is alert. At baseline orientation, motor grossly intact               Skin: Skin is warm. No rashes, no other new lesions, LE edema - none               Psychiatric: Pt behavior is normal without agitation   Micro: none  Cardiac tracings I have personally interpreted today:  none  Pertinent Radiological findings (summarize): none   Lab Results  Component Value Date   WBC 10.8 (H) 11/09/2022   HGB 13.3 11/09/2022   HCT 44.1 11/09/2022   PLT 162 11/09/2022   GLUCOSE 166 (H) 11/09/2022   CHOL 171 06/28/2022   TRIG 235.0 (H) 06/28/2022   HDL 57.40 06/28/2022   LDLDIRECT 79.0 06/28/2022   LDLCALC 66 03/07/2022   ALT 27 11/08/2022   AST 19 11/08/2022   NA 143 11/09/2022   K 4.9 11/09/2022   CL 94 (L) 11/09/2022   CREATININE 0.37 (L) 11/09/2022   BUN 11 11/09/2022   CO2 41 (H) 11/09/2022   TSH 4.09 03/07/2022   HGBA1C 7.8 (A) 01/13/2023   MICROALBUR 8.7 (H) 03/07/2022   Assessment/Plan:  [name] is a 67 y.o. Other or two or more races [6] female with  has a past medical history of Bronchitis, CAD (coronary artery disease), CHF (congestive heart failure) ([facility]), COPD (chronic obstructive pulmonary disease) ([facility]), Diabetes ([facility]), Headache, Heart disease, Hyperlipidemia, Hypertension, Hypertensive emergency (07/01/2013), Impaired glucose tolerance (08/20/2014), and Sinusitis.  Diabetes ([facility]) Lab Results  Component Value Date   HGBA1C 7.8 (A) 01/13/2023   Uncontrolled now tolerating mounjaro  2.5 mg, willing for increase to 5 mg, pt to continue current medical treatment metformin ER 500 - 1 qd   Essential hypertension BP Readings from Last 3 Encounters:  02/17/23 130/78  02/17/23 120/60  02/06/23 120/62   Stable, pt to continue medical treatment norvasc 5 mg qd   HLD (hyperlipidemia) Lab Results  Component Value Date   LDLCALC 66 03/07/2022   Stable, pt to continue current statin lipitor 40 mg qd   Low back pain I suspect msk spasm, for pain control, also for t spine and ls spine plain films  Tobacco abuse Pt counsled to quit, pt not ready  Vitamin D deficiency Last vitamin D Lab Results  Component Value Date   VD25OH 22.17 (L) 06/28/2022   Low, to start oral replacement  Followup: No follow-ups on file.  Oliver Barre, MD 02/17/2023 9:07 PM [facility]

## 2023-02-17 NOTE — Assessment & Plan Note (Addendum)
She is compliant with NIV and this has certainly helped improve her daytime somnolence and fatigue. Her overall quality of life is improved, she is breathing better. She reports good compliance Check download on NIV

## 2023-02-17 NOTE — Progress Notes (Signed)
   Subjective:    Patient ID: [name], female    DOB: 04/29/1956, 67 y.o.   MRN: [id number]  HPI  67 yo smoker for FU of COPD, quit 11/2017 and mild tracheomalacia with chronic hypoxic resp failure on o2 since 2018   Recurrent exacerbations in 2022, requiring maintenance prednisone starting 06/2021 -hospitalized 1/18 for 2 days for COPD exacerbation treated with BiPAP, steroids and bronchodilators    PMH - CABG HFpEF  65-month follow-up visit. She was started on NIV last visit and given Lasix for pedal edema.  She was having symptoms of CO2 retention.  She states that NIV has really helped, she feels like she is breathing better, feels more lucid in the mornings when she wakes up. She developed sudden onset right-sided pleuritic chest pain for 2 days, pain is not radiating to her legs but close to her side, wonders if there it is her lungs or kidney. Took 4 tablets of prednisone last 2 days. Breathing is overall doing better. Reports pedal edema for which she takes Lasix   Significant tests/ events reviewed  CT angiogram chest 01/2020-emphysema, mild tracheomalacia   04/2019-pulmonary function test- FVC 1.05 (44% predicted), postbronchodilator ratio 47, postbronchodilator FEV1 0.47 (25% predicted), DLCO 54   Spirometry 02/2017 >> ratio of 59, FEV1 of 42% FVC of 56% ABG 01/2014 was 7.29/66/75/92%   08/2014 >> PSG neg for OSA , Desaturation as low as 85%    Review of Systems neg for any significant sore throat, dysphagia, itching, sneezing, nasal congestion or excess/ purulent secretions, fever, chills, sweats, unintended wt loss, pleuritic or exertional cp, hempoptysis, orthopnea pnd or change in chronic leg swelling. Also denies presyncope, palpitations, heartburn, abdominal pain, nausea, vomiting, diarrhea or change in bowel or urinary habits, dysuria,hematuria, rash, arthralgias, visual complaints, headache, numbness weakness or ataxia.     Objective:   Physical  Exam  Gen. Pleasant, obese, in no distress ENT - no lesions, no post nasal drip Neck: No JVD, no thyromegaly, no carotid bruits Lungs: no use of accessory muscles, no dullness to percussion, decreased without rales or rhonchi  Cardiovascular: Rhythm regular, heart sounds  normal, no murmurs or gallops, 1+ peripheral edema Musculoskeletal: No deformities, no cyanosis or clubbing , no tremors       Assessment & Plan:   Right pleuritic chest pain -she has good air entry on the right, no point tenderness to indicate rib pain.  Will order chest x-ray-however patient stated she could not wait since she had another appointment and had to leave and will get chest x-ray at her PCPs office

## 2023-02-17 NOTE — Assessment & Plan Note (Signed)
Pt counsled to quit, pt not ready °

## 2023-02-17 NOTE — Assessment & Plan Note (Signed)
Prednisone dependent.  Would like her to cut down prednisone to 10/5 alternating but she has gotten into the habit of using prednisone for every symptom that she has. Continue Trelegy, albuterol for rescue. We discussed COPD action plan and signs and symptoms of COPD exacerbation

## 2023-02-17 NOTE — Patient Instructions (Addendum)
Please remember to have you Tdap and shingles at the pharmacy  Please take all new medication as prescribed - the percocet  Ok to increase the mounjaro to 5 mg weekly when the 2.5 mg is done  Please continue all other medications as before, including the muscle relaxer as needed  Please have the pharmacy call with any other refills you may need.  Please continue your efforts at being more active, low cholesterol diet, and weight control.  You are otherwise up to date with prevention measures today.  Please keep your appointments with your specialists as you may have planned  Please go to the XRAY Department in the first floor for the x-ray testing  Please go to the LAB at the blood drawing area for the tests to be done  You will be contacted by phone if any changes need to be made immediately.  Otherwise, you will receive a letter about your results with an explanation, but please check with MyChart first.  Please remember to sign up for MyChart if you have not done so, as this will be important to you in the future with finding out test results, communicating by private email, and scheduling acute appointments online when needed.  Please make an Appointment to return in 6 months, or sooner if needed

## 2023-02-17 NOTE — Assessment & Plan Note (Signed)
Lab Results  Component Value Date   LDLCALC 66 03/07/2022   Stable, pt to continue current statin lipitor 40 mg qd  

## 2023-02-17 NOTE — Assessment & Plan Note (Signed)
BP Readings from Last 3 Encounters:  02/17/23 130/78  02/17/23 120/60  02/06/23 120/62   Stable, pt to continue medical treatment norvasc 5 mg qd

## 2023-02-18 LAB — URINALYSIS, ROUTINE W REFLEX MICROSCOPIC
Bilirubin Urine: NEGATIVE
Hgb urine dipstick: NEGATIVE
Ketones, ur: NEGATIVE
Leukocytes,Ua: NEGATIVE
Nitrite: NEGATIVE
Specific Gravity, Urine: 1.01 (ref 1.000–1.030)
Total Protein, Urine: NEGATIVE
Urine Glucose: NEGATIVE
Urobilinogen, UA: 0.2 (ref 0.0–1.0)
pH: 7.5 (ref 5.0–8.0)

## 2023-02-21 ENCOUNTER — Ambulatory Visit: Payer: Medicare HMO | Admitting: Internal Medicine

## 2023-02-21 ENCOUNTER — Telehealth: Payer: Self-pay | Admitting: Internal Medicine

## 2023-02-21 NOTE — Telephone Encounter (Signed)
Pt called stating she still having back pains and she want someone to go over her recent test results with her.Pt also stated she already have medication but can she do something else to relieve some pain.   Best call number [phone]

## 2023-02-24 ENCOUNTER — Ambulatory Visit (INDEPENDENT_AMBULATORY_CARE_PROVIDER_SITE_OTHER): Payer: Medicare HMO

## 2023-02-24 ENCOUNTER — Ambulatory Visit
Admission: EM | Admit: 2023-02-24 | Discharge: 2023-02-24 | Disposition: A | Discharge status: Home / Self Care | Payer: Medicare HMO | Attending: Internal Medicine | Admitting: Internal Medicine

## 2023-02-24 ENCOUNTER — Other Ambulatory Visit: Payer: Self-pay

## 2023-02-24 DIAGNOSIS — R3915 Urgency of urination: Secondary | ICD-10-CM | POA: Diagnosis not present

## 2023-02-24 DIAGNOSIS — R079 Chest pain, unspecified: Secondary | ICD-10-CM | POA: Diagnosis not present

## 2023-02-24 DIAGNOSIS — M549 Dorsalgia, unspecified: Secondary | ICD-10-CM | POA: Diagnosis not present

## 2023-02-24 DIAGNOSIS — J441 Chronic obstructive pulmonary disease with (acute) exacerbation: Secondary | ICD-10-CM

## 2023-02-24 DIAGNOSIS — J9611 Chronic respiratory failure with hypoxia: Secondary | ICD-10-CM | POA: Diagnosis not present

## 2023-02-24 DIAGNOSIS — F17210 Nicotine dependence, cigarettes, uncomplicated: Secondary | ICD-10-CM

## 2023-02-24 DIAGNOSIS — J9612 Chronic respiratory failure with hypercapnia: Secondary | ICD-10-CM

## 2023-02-24 DIAGNOSIS — R059 Cough, unspecified: Secondary | ICD-10-CM | POA: Diagnosis not present

## 2023-02-24 LAB — POCT URINALYSIS DIP (MANUAL ENTRY)
Bilirubin, UA: NEGATIVE
Glucose, UA: NEGATIVE mg/dL
Ketones, POC UA: NEGATIVE mg/dL
Leukocytes, UA: NEGATIVE
Nitrite, UA: NEGATIVE
Protein Ur, POC: 100 mg/dL — AB
Spec Grav, UA: >=1.03 — AB (ref 1.010–1.025)
Urobilinogen, UA: 0.2 E.U./dL
pH, UA: 6 (ref 5.0–8.0)

## 2023-02-24 MED ORDER — BENZONATATE 100 MG PO CAPS: 100.0000 mg | ORAL_CAPSULE | Freq: Three times a day (TID) | ORAL | 0 refills | Status: DC

## 2023-02-24 MED ORDER — IPRATROPIUM-ALBUTEROL 0.5-2.5 (3) MG/3ML IN SOLN
3.0000 mL | Freq: Once | RESPIRATORY_TRACT | Status: AC
  Administered 2023-02-24: 3 mL via RESPIRATORY_TRACT

## 2023-02-24 MED ORDER — AZITHROMYCIN 250 MG PO TABS: 250.0000 mg | ORAL_TABLET | Freq: Every day | ORAL | 0 refills | Status: DC

## 2023-02-24 MED ORDER — METHOCARBAMOL 500 MG PO TABS: 500.0000 mg | ORAL_TABLET | Freq: Two times a day (BID) | ORAL | 0 refills | Status: DC

## 2023-02-24 MED ORDER — PREDNISONE 10 MG PO TABS: 20.0000 mg | ORAL_TABLET | Freq: Every day | ORAL | 0 refills | Status: AC

## 2023-02-24 NOTE — ED Triage Notes (Signed)
Pt is here with back spasms for a week now, pt states 5/1 for a physical & he prescribed her with ocycodone for the spasms. Pt states she has muscle relaxers on hand prescribed to her already.

## 2023-02-24 NOTE — Discharge Instructions (Addendum)
I would like for you to start taking 20 mg of prednisone once daily for 5 days, then go back down to your normal daily dose.  Take this with food to avoid stomach upset.  Your chest x-ray looks great.  I suspect that your upper back pain is due to muscle spasm and COPD exacerbation may also be contributing to your pain.  Take azithromycin antibiotic as prescribed. Use your breathing treatments at home as needed. Take Tessalon Perles every 8 hours as needed for cough. Use heat to the upper back to relieve muscle spasm.  You may take muscle relaxer every 12 hours as needed for muscle spasm, however do not drive/drink alcohol while taking this medication as it can make you very sleepy.  Please follow-up with your primary care provider for ongoing evaluation of your lung function and upper back pain.   If you develop any new or worsening symptoms or do not improve in the next 2 to 3 days, please return.  If your symptoms are severe, please go to the emergency room.  Follow-up with your primary care provider for further evaluation and management of your symptoms as well as ongoing wellness visits.  I hope you feel better!

## 2023-02-24 NOTE — ED Provider Notes (Signed)
[name]    CSN: [id number] Arrival date & time: 02/24/23  0802      History   Chief Complaint Chief Complaint  Patient presents with   back spasm    HPI [name] is a 67 y.o. female.   Patient with history of CHF, COPD, CAD, type 2 diabetes, and chronic respiratory failure on home oxygen presents to urgent care for evaluation of chronic right-sided upper back pain that has been present for multiple months but worsened a couple weeks ago.  Pain starts in the right flank region and spreads to the right low back.  Pain sometimes crosses midline and goes to the left upper back but is mostly to the right.  Pain is much worse and with movement and when taking deep breaths.  States she has had a slightly productive cough for the last couple of days with green/yellow phlegm without fever, chills, shortness of breath, chest pain, nausea, vomiting, diarrhea, or heart palpitations. Takes prednisone 10mg  daily due to chronic respiratory failure. No recent falls, injuries, or trauma to the upper back. No orthopnea, recent weight changes, or leg swelling. Denies hematuria, urinary frequency, and dysuria. Reports urinary urgency at baseline and cannot tell if this has been worse than normal lately (takes Lasix). Denise use of SGLT-2 inhibitor. No recent antibiotic use or history of frequent UTIs/kidney stones. Currently stooling normally, however states she was constipated a few days ago and was able to relieve this with Miralax. No recent blood/mucous to the stools. She saw her PCP for symptoms 02/17/23 where she was prescribed percocet for pain. She has taken 1-2 of these pills and states they have not helped. Last dose of percocet was 5am this morning and "has not helped at all". History of muscle relaxer use, states she has these leftover from 1 year ago but has not taken this recently. Tylenol does not help with pain. She is a current everyday cigarette smoker.      Past Medical  History:  Diagnosis Date   Bronchitis    CAD (coronary artery disease)    a. 02/2002: CABG x3 with LIMA to LAD, RIMA to RCA, and SVG to OM   CHF (congestive heart failure) (HCC)    COPD (chronic obstructive pulmonary disease) (HCC)    Diabetes (HCC)    Headache    Heart disease    Hyperlipidemia    Hypertension    Hypertensive emergency 07/01/2013   Impaired glucose tolerance 08/20/2014   Sinusitis     Patient Active Problem List   Diagnosis Date Noted   Alcohol abuse 02/17/2023   Chronic idiopathic constipation 02/17/2023   Family history of colon cancer 02/17/2023   Imaging of gastrointestinal tract abnormal 02/17/2023   Fatty liver 02/17/2023   Obesity 02/17/2023   Estrogen deficiency 11/14/2022   COPD exacerbation (HCC) 11/07/2022   Pain, abdominal, RLQ 06/28/2022   Low back pain 03/14/2022   Constipation 03/14/2022   Tobacco abuse 07/11/2021   Abnormal mammogram 04/13/2021   Trichomonal vulvovaginitis 04/13/2021   Vaginal discharge 04/13/2021   Aortic atherosclerosis (HCC) 04/13/2021   Tracheomalacia 11/23/2020   Vitamin D deficiency 01/03/2020   HLD (hyperlipidemia) 01/03/2020   Acute pancreatitis 09/09/2019   Epigastric pain 09/06/2019   Back pain 09/06/2019   Eustachian tube dysfunction 05/18/2019   Insomnia 05/18/2019   Chronic respiratory failure with hypoxia and hypercapnia (HCC) 12/14/2018   Chronic obstructive lung disease (HCC) 03/18/2017   Former smoker 03/07/2017   Left lumbar radiculopathy 10/19/2015  Cough 08/29/2015   Pedal edema 02/22/2015   Diabetes (HCC) 08/20/2014   Pruritus 07/08/2014   Nocturnal hypoxemia 07/05/2014   Esophagus, foreign body 06/12/2014   Polycythemia, secondary 02/16/2014   5 mm Lung nodule, solitary 02/02/2014   GERD (gastroesophageal reflux disease) 02/01/2014   Hypersomnolence 02/01/2014   Allergic rhinitis 09/03/2013   CAD (coronary artery disease) 09/03/2013   Encounter for well adult exam with abnormal findings  09/03/2013   Essential hypertension 07/07/2013   COPD (chronic obstructive pulmonary disease) with chronic bronchitis (HCC) 07/07/2013   Status post coronary artery bypass grafting 07/07/2013   Headache 07/01/2013   Polycythemia 07/01/2013    Past Surgical History:  Procedure Laterality Date   CORONARY ARTERY BYPASS GRAFT  2003   Triple bypass   ESOPHAGOGASTRODUODENOSCOPY Left 06/13/2014   Procedure: ESOPHAGOGASTRODUODENOSCOPY (EGD);  Surgeon: Charna Elizabeth, MD;  Location: [facility] ENDOSCOPY;  Service: Endoscopy;  Laterality: Left;   RIGID ESOPHAGOSCOPY N/A 06/12/2014   Procedure: RIGID ESOPHAGOSCOPY WITH FOREIGN BODY REMOVAL;  Surgeon: Darletta Moll, MD;  Location: [facility] OR;  Service: ENT;  Laterality: N/A;    OB History   No obstetric history on file.      Home Medications    Prior to Admission medications   Medication Sig Start Date End Date Taking? Authorizing Provider  azithromycin (ZITHROMAX) 250 MG tablet Take 1 tablet (250 mg total) by mouth daily. Take first 2 tablets together, then 1 every day until finished. 02/24/23  Yes Carlisle Beers, FNP  benzonatate (TESSALON) 100 MG capsule Take 1 capsule (100 mg total) by mouth every 8 (eight) hours. 02/24/23  Yes Carlisle Beers, FNP  methocarbamol (ROBAXIN) 500 MG tablet Take 1 tablet (500 mg total) by mouth 2 (two) times daily. 02/24/23  Yes Carlisle Beers, FNP  predniSONE (DELTASONE) 10 MG tablet Take 2 tablets (20 mg total) by mouth daily for 5 days. 02/24/23 03/01/23 Yes Carlisle Beers, FNP  acetaminophen (TYLENOL) 500 MG tablet Take 1,000 mg by mouth 2 (two) times daily as needed for headache, fever, moderate pain or mild pain.    [provider]  albuterol (PROVENTIL) (2.5 MG/3ML) 0.083% nebulizer solution Take 3 mLs (2.5 mg total) by nebulization every 6 (six) hours as needed for wheezing or shortness of breath. 07/17/22   Oretha Milch, MD  albuterol (VENTOLIN HFA) 108 (90 Base) MCG/ACT inhaler INHALE 1-2 PUFFS  BY MOUTH EVERY 6 HOURS AS NEEDED FOR WHEEZE OR SHORTNESS OF BREATH 12/13/22   Oretha Milch, MD  amLODipine (NORVASC) 5 MG tablet Take 1 tablet (5 mg total) by mouth daily. Patient taking differently: Take 5 mg by mouth in the morning. 05/21/22   Jake Bathe, MD  atorvastatin (LIPITOR) 40 MG tablet TAKE 1 TABLET BY MOUTH EVERY DAY Patient taking differently: Take 40 mg by mouth in the morning. 05/07/22   Corwin Levins, MD  bisoprolol (ZEBETA) 5 MG tablet Take 1 tablet (5 mg total) by mouth daily. 12/13/22   Jake Bathe, MD  Cholecalciferol (VITAMIN D-3 PO) Take 1 capsule by mouth in the morning.    [provider]  Cyanocobalamin (VITAMIN B-12 PO) Take 1 tablet by mouth in the morning.    [provider]  Dulaglutide (TRULICITY) 0.75 MG/0.5ML SOPN Inject 0.75 mg into the skin once a week. 01/23/22   Corwin Levins, MD  fluticasone (FLONASE) 50 MCG/ACT nasal spray Place 2 sprays into both nostrils daily. Patient taking differently: Place 2 sprays into both nostrils  in the morning. 10/25/22   Pincus Sanes, MD  furosemide (LASIX) 20 MG tablet Take 1 tablet (20 mg total) by mouth daily for 3 days. 11/09/22 11/12/22  Baldwin Jamaica, MD  ipratropium-albuterol (DUONEB) 0.5-2.5 (3) MG/3ML SOLN Take 3 mLs by nebulization every 6 (six) hours as needed (wheezing, shortness of breath).    [provider]  isosorbide mononitrate (IMDUR) 60 MG 24 hr tablet Take 1.5 tablets (90 mg total) by mouth daily. Patient taking differently: Take 90 mg by mouth at bedtime. 05/23/22   Jake Bathe, MD  MAGNESIUM OXIDE PO Take 1 tablet by mouth at bedtime.    [provider]  metFORMIN (GLUCOPHAGE-XR) 500 MG 24 hr tablet TAKE 1 TABLET BY MOUTH EVERY DAY WITH BREAKFAST 01/13/23   Corwin Levins, MD  Multiple Vitamins-Minerals (MULTIVITAMIN WOMEN 50+) TABS Take 1 tablet by mouth in the morning.    [provider]  oxyCODONE-acetaminophen (PERCOCET/ROXICET) 5-325 MG tablet Take 1 tablet by  mouth every 6 (six) hours as needed for severe pain. 02/17/23   Corwin Levins, MD  predniSONE (DELTASONE) 10 MG tablet prednisone 10 mg tablets, take 1 to 2 tablets daily #60 02/17/23   Oretha Milch, MD  predniSONE (DELTASONE) 10 MG tablet Take 0.5 tablets (5 mg total) by mouth daily with breakfast. 02/17/23   Oretha Milch, MD  tirzepatide [facility]) 5 MG/0.5ML Pen Inject 5 mg into the skin once a week. 02/17/23   Corwin Levins, MD  Harrel Carina ELLIPTA 200-62.5-25 MCG/ACT AEPB INHALE 1 PUFF BY MOUTH EVERY DAY 01/14/23   Glenford Bayley, NP    Family History Family History  Problem Relation Age of Onset   Lung cancer Paternal Uncle    Heart disease Mother    Brain cancer Mother    Cancer Maternal Grandmother        colon   Stroke Other    Cancer Other        x 4 aunts   Lung cancer Maternal Aunt    Suicidality Brother     Social History Social History   Tobacco Use   Smoking status: Some Days    Packs/day: 0.50    Years: 47.00    Additional pack years: 0.00    Total pack years: 23.50    Types: Cigarettes    Start date: 12/29/1971    Last attempt to quit: 05/22/2019    Years since quitting: 3.7   Smokeless tobacco: Never   Tobacco comments:    Smoking daily 1-2 day.  Stops for a while and then goes back.  08/29/2022 hfb  Substance Use Topics   Alcohol use: Yes    Alcohol/week: 5.0 - 6.0 standard drinks of alcohol    Types: 5 - 6 Glasses of wine per week   Drug use: Not Currently    Types: Marijuana    Comment: as a teenager     Allergies   Augmentin [amoxicillin-pot clavulanate] and Chantix [varenicline]   Review of Systems Review of Systems Per HPI  Physical Exam Triage Vital Signs ED Triage Vitals  Enc Vitals Group     BP 02/24/23 0820 (!) 155/72     Pulse Rate 02/24/23 0820 86     Resp 02/24/23 0820 18     Temp 02/24/23 0820 99.2 F (37.3 C)     Temp Source 02/24/23 0820 Oral     SpO2 02/24/23 0820 96 %     Weight --  Height --      Head  Circumference --      Peak Flow --      Pain Score 02/24/23 0819 9     Pain Loc --      Pain Edu? --      Excl. in GC? --    No data found.  Updated Vital Signs BP (!) 155/72 (BP Location: Left Arm) Comment: notified provider, has NOT taken BP meds this am  Pulse 86   Temp 99.2 F (37.3 C) (Oral)   Resp 18   SpO2 96%   Visual Acuity Right Eye Distance:   Left Eye Distance:   Bilateral Distance:    Right Eye Near:   Left Eye Near:    Bilateral Near:     Physical Exam Vitals and nursing note reviewed.  Constitutional:      Appearance: She is not ill-appearing or toxic-appearing.  HENT:     Head: Normocephalic and atraumatic.     Right Ear: Hearing and external ear normal.     Left Ear: Hearing and external ear normal.     Nose: Nose normal.     Mouth/Throat:     Lips: Pink.     Mouth: Mucous membranes are moist.     Pharynx: No posterior oropharyngeal erythema.  Eyes:     General: Lids are normal. Vision grossly intact. Gaze aligned appropriately.     Extraocular Movements: Extraocular movements intact.     Conjunctiva/sclera: Conjunctivae normal.  Cardiovascular:     Rate and Rhythm: Normal rate and regular rhythm.     Heart sounds: Normal heart sounds, S1 normal and S2 normal.  Pulmonary:     Effort: Prolonged expiration present. No respiratory distress.     Breath sounds: Normal air entry. No stridor. Decreased breath sounds (to bilateral upper and lower lung fields) and wheezing (faint expiratory wheezing heard to the left lower lung field) present. No rhonchi or rales.     Comments: 2 liters nasal cannula oxygen. No acute respiratory distress. Speaking in full sentences without difficulty.  Chest:     Chest wall: No tenderness.  Abdominal:     General: Abdomen is flat. Bowel sounds are normal.     Tenderness: There is no abdominal tenderness. There is no right CVA tenderness or left CVA tenderness.     Hernia: No hernia is present.  Musculoskeletal:      Cervical back: Normal and neck supple. No tenderness.     Thoracic back: Tenderness (Tender to the right thoracic paraspinals) present. No swelling, edema, deformity, signs of trauma, lacerations, spasms or bony tenderness. Decreased range of motion. No scoliosis.     Lumbar back: Normal.       Back:     Right lower leg: No edema.     Left lower leg: No edema.  Lymphadenopathy:     Cervical: No cervical adenopathy.  Skin:    General: Skin is warm and dry.     Capillary Refill: Capillary refill takes less than 2 seconds.     Findings: No rash.  Neurological:     General: No focal deficit present.     Mental Status: She is alert and oriented to person, place, and time. Mental status is at baseline.     Cranial Nerves: No dysarthria or facial asymmetry.  Psychiatric:        Mood and Affect: Mood normal.        Speech: Speech normal.  Behavior: Behavior normal.        Thought Content: Thought content normal.        Judgment: Judgment normal.      UC Treatments / Results  Labs (all labs ordered are listed, but only abnormal results are displayed) Labs Reviewed  POCT URINALYSIS DIP (MANUAL ENTRY) - Abnormal; Notable for the following components:      Result Value   Spec Grav, UA >=1.030 (*)    Blood, UA trace-intact (*)    Protein Ur, POC =100 (*)    All other components within normal limits    EKG   Radiology DG Chest 2 View  Result Date: 02/24/2023 CLINICAL DATA:  Cough, upper back pain EXAM: CHEST - 2 VIEW COMPARISON:  Chest radiograph dated November 07, 2022 FINDINGS: The heart size and mediastinal contours are within normal limits. Sternotomy wires and mediastinal surgical clips suggesting prior coronary artery bypass grafting. Both lungs are clear. The visualized skeletal structures are unremarkable. IMPRESSION: No active cardiopulmonary disease. Electronically Signed   By: Larose Hires D.O.   On: 02/24/2023 10:02    Procedures Procedures (including critical care  time)  Medications Ordered in UC Medications  ipratropium-albuterol (DUONEB) 0.5-2.5 (3) MG/3ML nebulizer solution 3 mL (3 mLs Nebulization Given 02/24/23 0910)    Initial Impression / Assessment and Plan / UC Course  I have reviewed the triage vital signs and the nursing notes.  Pertinent labs & imaging results that were available during my care of the patient were reviewed by me and considered in my medical decision making (see chart for details).   1. Upper back pain on right side, chronic respiratory failure with hypoxia and hypercapnia, COPD exacerbation Suspect pain to the right upper back is musculoskeletal in etiology, however there is also concern for urinary and pulmonary abnormality.  On initial assessment, wheezing heard to the left lower lung field with significantly decreased breath sounds to the right lower lung field.  After DuoNeb, wheezing to the left lower lung field resolved, however coarse crackles heard to the right lower lung field. Chest x-ray shows stable findings.  Will manage this as a COPD exacerbation with azithromycin antibiotic as prescribed and prednisone 20 mg once daily for 5 days.  Patient already takes prednisone 10 mg once daily and has been advised to take this with food to avoid stomach upset.  Advised to use albuterol inhaler/breathing treatments at home as needed.  Currently nontoxic in appearance with hemodynamically stable vital signs.  She is oxygenating well on her baseline 4L oxygen via nasal cannula. Ambulatory without difficulty despite pain.   Urinalysis unremarkable for signs of urinary tract infection, no indication for culture. Low suspicion for pyelonephritis or nephrolithiasis etiology.  I do suspect that there is some muscle strain involvement contributing to patient's right upper back pain, therefore we will send in Robaxin muscle relaxer to be used as needed for spasm.  Reviewed imaging ordered by PCP on Feb 20, 2023 (thoracic spine x-ray) and this  is negative for acute bony abnormality.  Encouraged patient to follow-up with primary care provider to discuss chronic pain management and for ongoing management of chronic respiratory problems.   Discussed physical exam and available lab work findings in clinic with patient.  Counseled patient regarding appropriate use of medications and potential side effects for all medications recommended or prescribed today. Discussed red flag signs and symptoms of worsening condition,when to call the PCP office, return to urgent care, and when to seek higher level of  care in the emergency department. Patient verbalizes understanding and agreement with plan. All questions answered. Patient discharged in stable condition.    Final Clinical Impressions(s) / UC Diagnoses   Final diagnoses:  Upper back pain on right side  Chronic respiratory failure with hypoxia and hypercapnia (HCC)  COPD exacerbation (HCC)  Urinary urgency  Cigarette nicotine dependence without complication     Discharge Instructions      I would like for you to start taking 20 mg of prednisone once daily for 5 days, then go back down to your normal daily dose.  Take this with food to avoid stomach upset.  Your chest x-ray looks great.  I suspect that your upper back pain is due to muscle spasm and COPD exacerbation may also be contributing to your pain.  Take azithromycin antibiotic as prescribed. Use your breathing treatments at home as needed. Take Tessalon Perles every 8 hours as needed for cough. Use heat to the upper back to relieve muscle spasm.  You may take muscle relaxer every 12 hours as needed for muscle spasm, however do not drive/drink alcohol while taking this medication as it can make you very sleepy.  Please follow-up with your primary care provider for ongoing evaluation of your lung function and upper back pain.   If you develop any new or worsening symptoms or do not improve in the next 2 to 3 days, please  return.  If your symptoms are severe, please go to the emergency room.  Follow-up with your primary care provider for further evaluation and management of your symptoms as well as ongoing wellness visits.  I hope you feel better!      ED Prescriptions     Medication Sig Dispense Auth. Provider   azithromycin (ZITHROMAX) 250 MG tablet Take 1 tablet (250 mg total) by mouth daily. Take first 2 tablets together, then 1 every day until finished. 6 tablet Reita May M, FNP   predniSONE (DELTASONE) 10 MG tablet Take 2 tablets (20 mg total) by mouth daily for 5 days. 10 tablet Carlisle Beers, FNP   methocarbamol (ROBAXIN) 500 MG tablet Take 1 tablet (500 mg total) by mouth 2 (two) times daily. 20 tablet Reita May M, FNP   benzonatate (TESSALON) 100 MG capsule Take 1 capsule (100 mg total) by mouth every 8 (eight) hours. 21 capsule Carlisle Beers, FNP      PDMP not reviewed this encounter.   Carlisle Beers, [place] 02/24/23 1013

## 2023-02-27 ENCOUNTER — Other Ambulatory Visit: Payer: Self-pay

## 2023-02-27 ENCOUNTER — Emergency Department (HOSPITAL_COMMUNITY)
Admission: EM | Admit: 2023-02-27 | Discharge: 2023-02-27 | Disposition: A | Discharge status: Home / Self Care | Payer: Medicare HMO | Attending: Emergency Medicine | Admitting: Emergency Medicine

## 2023-02-27 ENCOUNTER — Encounter (HOSPITAL_COMMUNITY): Payer: Self-pay | Admitting: Emergency Medicine

## 2023-02-27 DIAGNOSIS — Z79899 Other long term (current) drug therapy: Secondary | ICD-10-CM | POA: Insufficient documentation

## 2023-02-27 DIAGNOSIS — I509 Heart failure, unspecified: Secondary | ICD-10-CM | POA: Insufficient documentation

## 2023-02-27 DIAGNOSIS — X58XXXA Exposure to other specified factors, initial encounter: Secondary | ICD-10-CM | POA: Insufficient documentation

## 2023-02-27 DIAGNOSIS — Z7984 Long term (current) use of oral hypoglycemic drugs: Secondary | ICD-10-CM | POA: Diagnosis not present

## 2023-02-27 DIAGNOSIS — S39012A Strain of muscle, fascia and tendon of lower back, initial encounter: Secondary | ICD-10-CM

## 2023-02-27 DIAGNOSIS — J449 Chronic obstructive pulmonary disease, unspecified: Secondary | ICD-10-CM | POA: Diagnosis not present

## 2023-02-27 DIAGNOSIS — E119 Type 2 diabetes mellitus without complications: Secondary | ICD-10-CM | POA: Insufficient documentation

## 2023-02-27 DIAGNOSIS — Z7951 Long term (current) use of inhaled steroids: Secondary | ICD-10-CM | POA: Insufficient documentation

## 2023-02-27 DIAGNOSIS — Z87891 Personal history of nicotine dependence: Secondary | ICD-10-CM | POA: Insufficient documentation

## 2023-02-27 DIAGNOSIS — S29012A Strain of muscle and tendon of back wall of thorax, initial encounter: Secondary | ICD-10-CM | POA: Insufficient documentation

## 2023-02-27 DIAGNOSIS — I2510 Atherosclerotic heart disease of native coronary artery without angina pectoris: Secondary | ICD-10-CM | POA: Insufficient documentation

## 2023-02-27 DIAGNOSIS — I110 Hypertensive heart disease with heart failure: Secondary | ICD-10-CM | POA: Insufficient documentation

## 2023-02-27 DIAGNOSIS — Z951 Presence of aortocoronary bypass graft: Secondary | ICD-10-CM | POA: Diagnosis not present

## 2023-02-27 DIAGNOSIS — D72829 Elevated white blood cell count, unspecified: Secondary | ICD-10-CM | POA: Diagnosis not present

## 2023-02-27 DIAGNOSIS — M549 Dorsalgia, unspecified: Secondary | ICD-10-CM | POA: Diagnosis not present

## 2023-02-27 LAB — COMPREHENSIVE METABOLIC PANEL
ALT: 21 U/L (ref 0–44)
AST: 21 U/L (ref 15–41)
Albumin: 3.6 g/dL (ref 3.5–5.0)
Alkaline Phosphatase: 88 U/L (ref 38–126)
Anion gap: 12 (ref 5–15)
BUN: 10 mg/dL (ref 8–23)
CO2: 33 mmol/L — ABNORMAL HIGH (ref 22–32)
Calcium: 9.3 mg/dL (ref 8.9–10.3)
Chloride: 94 mmol/L — ABNORMAL LOW (ref 98–111)
Creatinine, Ser: 0.54 mg/dL (ref 0.44–1.00)
GFR, Estimated: >60 mL/min (ref >60)
Glucose, Bld: 144 mg/dL — ABNORMAL HIGH (ref 70–99)
Potassium: 3.7 mmol/L (ref 3.5–5.1)
Sodium: 139 mmol/L (ref 135–145)
Total Bilirubin: 0.4 mg/dL (ref 0.3–1.2)
Total Protein: 6.3 g/dL — ABNORMAL LOW (ref 6.5–8.1)

## 2023-02-27 LAB — URINALYSIS, ROUTINE W REFLEX MICROSCOPIC
Bilirubin Urine: NEGATIVE
Glucose, UA: NEGATIVE mg/dL
Hgb urine dipstick: NEGATIVE
Ketones, ur: NEGATIVE mg/dL
Leukocytes,Ua: NEGATIVE
Nitrite: NEGATIVE
Protein, ur: 30 mg/dL — AB
Specific Gravity, Urine: 1.023 (ref 1.005–1.030)
pH: 6 (ref 5.0–8.0)

## 2023-02-27 LAB — CBC
HCT: 41.7 % (ref 36.0–46.0)
Hemoglobin: 12.7 g/dL (ref 12.0–15.0)
MCH: 27.5 pg (ref 26.0–34.0)
MCHC: 30.5 g/dL (ref 30.0–36.0)
MCV: 90.3 fL (ref 80.0–100.0)
Platelets: 223 10*3/uL (ref 150–400)
RBC: 4.62 MIL/uL (ref 3.87–5.11)
RDW: 13.6 % (ref 11.5–15.5)
WBC: 12 10*3/uL — ABNORMAL HIGH (ref 4.0–10.5)
nRBC: 0 % (ref 0.0–0.2)

## 2023-02-27 LAB — LIPASE, BLOOD: Lipase: 39 U/L (ref 11–51)

## 2023-02-27 MED ORDER — KETOROLAC TROMETHAMINE 60 MG/2ML IM SOLN
30.0000 mg | Freq: Once | INTRAMUSCULAR | Status: AC
  Administered 2023-02-27: 30 mg via INTRAMUSCULAR
  Filled 2023-02-27: qty 2

## 2023-02-27 NOTE — ED Notes (Signed)
Heat packs placed on pt lower back.

## 2023-02-27 NOTE — Discharge Instructions (Signed)
You may use over-the-counter Acetaminophen (Tylenol), Ibuprofen (Motrin) or Naproxen (Aleve) - Do not take Ibuprofen and Naproxen together; pick one or the other please. Additionally, you can use topical muscle creams such as SalonPas, [employer], Bengay, etc. Please stretch, apply ice or heat (whichever helps), and have massage therapy for additional assistance.

## 2023-02-27 NOTE — ED Triage Notes (Signed)
Pt in with mid-low back pain that radiates to R abdomen x 10 days. Pt states she has seen 2 doctors about this, and has been taking prescribed muscle relaxers and abx, but states her pain os worse tonight. On 3LNC at all times, hx of COPD. Feels a little more exerted tonight - sats 94% on 3LNC

## 2023-02-27 NOTE — ED Provider Notes (Signed)
[facility] EMERGENCY DEPARTMENT AT [facility] Provider Note  CSN: [id number] Arrival date & time: 02/27/23 0430  Chief Complaint(s) Back Pain and Abdominal Pain  HPI [name] is a 67 y.o. female with a past medical history listed below who presents to the emergency department for back pain has been ongoing for approximately 10 to 14 days.  She has been seen by her PCP in urgent care and diagnosed with muscle spasms.  She was prescribed oxycodone which provided no relief.  She was also given Robaxin.  States that she continues to have spasming pain in her back.  Pain is worse with certain movements.  Pain improves with sitting or being still.  She does intermittently get muscle spasms while sitting down as well.  She denies any associated abdominal pain but reports that the pain can radiate across her back and at times around to her right abdomen.  She denies any overt abdominal pain.  No urinary symptoms.  No nausea or vomiting.  No shortness of breath.  No chest pain.  No coughing or congestion. No bladder/bowel incontinence. No lower extremity weakness or loss of sensation. No other physical complaints.  Plain films were obtained at the PCPs office of the lumbar and thoracic spine that were notable for degenerative disc disease without any acute fractures, dislocations or bony lesions.  The history is provided by the patient.    Past Medical History Past Medical History:  Diagnosis Date   Bronchitis    CAD (coronary artery disease)    a. 02/2002: CABG x3 with LIMA to LAD, RIMA to RCA, and SVG to OM   CHF (congestive heart failure) (HCC)    COPD (chronic obstructive pulmonary disease) (HCC)    Diabetes (HCC)    Headache    Heart disease    Hyperlipidemia    Hypertension    Hypertensive emergency 07/01/2013   Impaired glucose tolerance 08/20/2014   Sinusitis    Patient Active Problem List   Diagnosis Date Noted   Alcohol abuse 02/17/2023   Chronic idiopathic  constipation 02/17/2023   Family history of colon cancer 02/17/2023   Imaging of gastrointestinal tract abnormal 02/17/2023   Fatty liver 02/17/2023   Obesity 02/17/2023   Estrogen deficiency 11/14/2022   COPD exacerbation (HCC) 11/07/2022   Pain, abdominal, RLQ 06/28/2022   Low back pain 03/14/2022   Constipation 03/14/2022   Tobacco abuse 07/11/2021   Abnormal mammogram 04/13/2021   Trichomonal vulvovaginitis 04/13/2021   Vaginal discharge 04/13/2021   Aortic atherosclerosis (HCC) 04/13/2021   Tracheomalacia 11/23/2020   Vitamin D deficiency 01/03/2020   HLD (hyperlipidemia) 01/03/2020   Acute pancreatitis 09/09/2019   Epigastric pain 09/06/2019   Back pain 09/06/2019   Eustachian tube dysfunction 05/18/2019   Insomnia 05/18/2019   Chronic respiratory failure with hypoxia and hypercapnia (HCC) 12/14/2018   Chronic obstructive lung disease (HCC) 03/18/2017   Former smoker 03/07/2017   Left lumbar radiculopathy 10/19/2015   Cough 08/29/2015   Pedal edema 02/22/2015   Diabetes (HCC) 08/20/2014   Pruritus 07/08/2014   Nocturnal hypoxemia 07/05/2014   Esophagus, foreign body 06/12/2014   Polycythemia, secondary 02/16/2014   5 mm Lung nodule, solitary 02/02/2014   GERD (gastroesophageal reflux disease) 02/01/2014   Hypersomnolence 02/01/2014   Allergic rhinitis 09/03/2013   CAD (coronary artery disease) 09/03/2013   Encounter for well adult exam with abnormal findings 09/03/2013   Essential hypertension 07/07/2013   COPD (chronic obstructive pulmonary disease) with chronic bronchitis (HCC) 07/07/2013  Status post coronary artery bypass grafting 07/07/2013   Headache 07/01/2013   Polycythemia 07/01/2013   Home Medication(s) Prior to Admission medications   Medication Sig Start Date End Date Taking? Authorizing Provider  acetaminophen (TYLENOL) 500 MG tablet Take 1,000 mg by mouth 2 (two) times daily as needed for headache, fever, moderate pain or mild pain.    [provider]  albuterol (PROVENTIL) (2.5 MG/3ML) 0.083% nebulizer solution Take 3 mLs (2.5 mg total) by nebulization every 6 (six) hours as needed for wheezing or shortness of breath. 07/17/22   Oretha Milch, MD  albuterol (VENTOLIN HFA) 108 (90 Base) MCG/ACT inhaler INHALE 1-2 PUFFS BY MOUTH EVERY 6 HOURS AS NEEDED FOR WHEEZE OR SHORTNESS OF BREATH 12/13/22   Oretha Milch, MD  amLODipine (NORVASC) 5 MG tablet Take 1 tablet (5 mg total) by mouth daily. Patient taking differently: Take 5 mg by mouth in the morning. 05/21/22   Jake Bathe, MD  atorvastatin (LIPITOR) 40 MG tablet TAKE 1 TABLET BY MOUTH EVERY DAY Patient taking differently: Take 40 mg by mouth in the morning. 05/07/22   Corwin Levins, MD  azithromycin (ZITHROMAX) 250 MG tablet Take 1 tablet (250 mg total) by mouth daily. Take first 2 tablets together, then 1 every day until finished. 02/24/23   Carlisle Beers, FNP  benzonatate (TESSALON) 100 MG capsule Take 1 capsule (100 mg total) by mouth every 8 (eight) hours. 02/24/23   Carlisle Beers, FNP  bisoprolol (ZEBETA) 5 MG tablet Take 1 tablet (5 mg total) by mouth daily. 12/13/22   Jake Bathe, MD  Cholecalciferol (VITAMIN D-3 PO) Take 1 capsule by mouth in the morning.    [provider]  Cyanocobalamin (VITAMIN B-12 PO) Take 1 tablet by mouth in the morning.    [provider]  Dulaglutide (TRULICITY) 0.75 MG/0.5ML SOPN Inject 0.75 mg into the skin once a week. 01/23/22   Corwin Levins, MD  fluticasone (FLONASE) 50 MCG/ACT nasal spray Place 2 sprays into both nostrils daily. Patient taking differently: Place 2 sprays into both nostrils in the morning. 10/25/22   Pincus Sanes, MD  furosemide (LASIX) 20 MG tablet Take 1 tablet (20 mg total) by mouth daily for 3 days. 11/09/22 11/12/22  Baldwin Jamaica, MD  ipratropium-albuterol (DUONEB) 0.5-2.5 (3) MG/3ML SOLN Take 3 mLs by nebulization every 6 (six) hours as needed (wheezing, shortness of breath).    [provider]  isosorbide mononitrate (IMDUR) 60 MG 24 hr tablet Take 1.5 tablets (90 mg total) by mouth daily. Patient taking differently: Take 90 mg by mouth at bedtime. 05/23/22   Jake Bathe, MD  MAGNESIUM OXIDE PO Take 1 tablet by mouth at bedtime.    [provider]  metFORMIN (GLUCOPHAGE-XR) 500 MG 24 hr tablet TAKE 1 TABLET BY MOUTH EVERY DAY WITH BREAKFAST 01/13/23   Corwin Levins, MD  methocarbamol (ROBAXIN) 500 MG tablet Take 1 tablet (500 mg total) by mouth 2 (two) times daily. 02/24/23   Carlisle Beers, FNP  Multiple Vitamins-Minerals (MULTIVITAMIN WOMEN 50+) TABS Take 1 tablet by mouth in the morning.    [provider]  oxyCODONE-acetaminophen (PERCOCET/ROXICET) 5-325 MG tablet Take 1 tablet by mouth every 6 (six) hours as needed for severe pain. 02/17/23   Corwin Levins, MD  predniSONE (DELTASONE) 10 MG tablet prednisone 10 mg tablets, take 1 to 2 tablets daily #60 02/17/23   Oretha Milch, MD  predniSONE (DELTASONE) 10  MG tablet Take 0.5 tablets (5 mg total) by mouth daily with breakfast. 02/17/23   Oretha Milch, MD  predniSONE (DELTASONE) 10 MG tablet Take 2 tablets (20 mg total) by mouth daily for 5 days. 02/24/23 03/01/23  Carlisle Beers, FNP  tirzepatide [facility]) 5 MG/0.5ML Pen Inject 5 mg into the skin once a week. 02/17/23   Corwin Levins, MD  Harrel Carina ELLIPTA 200-62.5-25 MCG/ACT AEPB INHALE 1 PUFF BY MOUTH EVERY DAY 01/14/23   Glenford Bayley, NP                                                                                                                                    Allergies Augmentin [amoxicillin-pot clavulanate] and Chantix [varenicline]  Review of Systems Review of Systems As noted in HPI  Physical Exam Vital Signs  I have reviewed the triage vital signs BP (!) 102/50   Pulse 65   Temp 99 F (37.2 C)   Resp 16   Ht 5\' 1"  (1.549 m)   Wt 83.9 kg   SpO2 100%   BMI 34.96 kg/m   Physical Exam Vitals reviewed.   Constitutional:      General: She is not in acute distress.    Appearance: She is well-developed. She is not diaphoretic.  HENT:     Head: Normocephalic and atraumatic.     Right Ear: External ear normal.     Left Ear: External ear normal.     Nose: Nose normal.  Eyes:     General: No scleral icterus.    Conjunctiva/sclera: Conjunctivae normal.  Neck:     Trachea: Phonation normal.  Cardiovascular:     Rate and Rhythm: Normal rate and regular rhythm.  Pulmonary:     Effort: Pulmonary effort is normal. No respiratory distress.     Breath sounds: No stridor.  Abdominal:     General: There is no distension.  Musculoskeletal:        General: Normal range of motion.     Cervical back: Normal range of motion.     Thoracic back: Tenderness present.       Back:  Neurological:     Mental Status: She is alert and oriented to person, place, and time.  Psychiatric:        Behavior: Behavior normal.     ED Results and Treatments Labs (all labs ordered are listed, but only abnormal results are displayed) Labs Reviewed  COMPREHENSIVE METABOLIC PANEL - Abnormal; Notable for the following components:      Result Value   Chloride 94 (*)    CO2 33 (*)    Glucose, Bld 144 (*)    Total Protein 6.3 (*)    All other components within normal limits  CBC - Abnormal; Notable for the following components:   WBC 12.0 (*)    All other components within normal limits  URINALYSIS, ROUTINE W REFLEX MICROSCOPIC -  Abnormal; Notable for the following components:   APPearance HAZY (*)    Protein, ur 30 (*)    Bacteria, UA RARE (*)    All other components within normal limits  LIPASE, BLOOD                                                                                                                         EKG  EKG Interpretation  Date/Time:    Ventricular Rate:    PR Interval:    QRS Duration:   QT Interval:    QTC Calculation:   R Axis:     Text Interpretation:          Radiology No results found.  Medications Ordered in ED Medications  ketorolac (TORADOL) injection 30 mg (30 mg Intramuscular Given 02/27/23 0553)                                                                                                                                     Procedures Procedures  (including critical care time)  Medical Decision Making / ED Course  Click here for ABCD2, HEART and other calculators  Medical Decision Making Amount and/or Complexity of Data Reviewed Labs: ordered.  Risk Prescription drug management.    This patient presents to the ED for concern of back pain, this involves an extensive number of treatment options, and is a complaint that carries with it a high risk of complications and morbidity. The differential diagnosis includes but not limited to:  Muscle strain/spasm. Will get labs to assess for any electrolyte derangements Doubt urinary source but will obtain a UA to ensure patient does not have a urinary tract infection. Less concern for pneumonia, pneumothorax, PE. Doubt intra-abdominal process.   Initial intervention:  IM toradol  Work up Interpretation and Management:   Laboratory Tests ordered listed below with my independent interpretation: CBC with leukocytosis.  Stable from 10 days ago.  No anemia. Metabolic panel without significant electrolyte derangements or renal insufficiency.    Reassessment: Pain improved after Toradol. Less spasming noted as well. Additional symptomatic management recommended.       Final Clinical Impression(s) / ED Diagnoses Final diagnoses:  Back strain, initial encounter   The patient appears reasonably screened and/or stabilized for discharge and I doubt any other medical condition or other [facility] requiring further screening, evaluation, or treatment in the ED at this time. I have  discussed the findings, Dx and Tx plan with the patient/family who expressed understanding and agree(s) with  the plan. Discharge instructions discussed at length. The patient/family was given strict return precautions who verbalized understanding of the instructions. No further questions at time of discharge.  Disposition: Discharge  Condition: Good  ED Discharge Orders     None        Follow Up: Corwin Levins, MD [address] [place] [address] [phone]  Call  to schedule an appointment for close follow up            This chart was dictated using voice recognition software.  Despite best efforts to proofread,  errors can occur which can change the documentation meaning.    Nira Conn, MD 02/27/23 [phone]

## 2023-03-03 ENCOUNTER — Encounter: Payer: Self-pay | Admitting: Internal Medicine

## 2023-03-03 DIAGNOSIS — M545 Low back pain, unspecified: Secondary | ICD-10-CM

## 2023-03-04 ENCOUNTER — Encounter: Payer: Self-pay | Admitting: *Deleted

## 2023-03-04 ENCOUNTER — Telehealth: Payer: Self-pay | Admitting: *Deleted

## 2023-03-04 ENCOUNTER — Ambulatory Visit (INDEPENDENT_AMBULATORY_CARE_PROVIDER_SITE_OTHER): Payer: Medicare HMO | Admitting: Sports Medicine

## 2023-03-04 VITALS — BP 132/80 | HR 86 | Ht 61.0 in | Wt 187.0 lb

## 2023-03-04 DIAGNOSIS — M546 Pain in thoracic spine: Secondary | ICD-10-CM

## 2023-03-04 DIAGNOSIS — G8929 Other chronic pain: Secondary | ICD-10-CM | POA: Diagnosis not present

## 2023-03-04 MED ORDER — KETOROLAC TROMETHAMINE 60 MG/2ML IM SOLN
60.0000 mg | Freq: Once | INTRAMUSCULAR | Status: AC
Start: 2023-03-04 — End: 2023-03-04
  Administered 2023-03-04: 60 mg via INTRAMUSCULAR

## 2023-03-04 MED ORDER — METHYLPREDNISOLONE ACETATE 80 MG/ML IJ SUSP
80.0000 mg | Freq: Once | INTRAMUSCULAR | Status: AC
Start: 2023-03-04 — End: 2023-03-04
  Administered 2023-03-04: 80 mg via INTRAMUSCULAR

## 2023-03-04 MED ORDER — MELOXICAM 15 MG PO TABS
15.0000 mg | ORAL_TABLET | Freq: Every day | ORAL | 0 refills | Status: DC
Start: 2023-03-04 — End: 2023-05-23

## 2023-03-04 MED ORDER — CYCLOBENZAPRINE HCL 5 MG PO TABS: 5.0000 mg | ORAL_TABLET | Freq: Two times a day (BID) | ORAL | 0 refills | Status: DC | PRN

## 2023-03-04 NOTE — Transitions of Care (Post Inpatient/ED Visit) (Signed)
03/04/2023  Name: [name] MRN: [id number] DOB: Jul 17, 1956  Today's TOC FU Call Status: Today's TOC FU Call Status:: Successful TOC FU Call Competed TOC FU Call Complete Date: 03/04/23  ED EMMI Red Alert notification on 03/03/23 from ED visit 02/27/23- EMMI call placed 03/04/23: "No scheduled follow up"  Transition Care Management Follow-up Telephone Call Date of Discharge: 02/27/23 Discharge Facility: [facility]) Type of Discharge: Emergency Department Reason for ED Visit: Other: (back strain/ MSK spasms) How have you been since you were released from the hospital?: Better ("I finally saw the doctor today, and I am feeling better about everything now that I saw him.  I kept having problems-- so I finally called and got an appointment") Any questions or concerns?: No  Items Reviewed: Did you receive and understand the discharge instructions provided?: Yes Medications obtained,verified, and reconciled?: No (patient declined) Medications Not Reviewed Reasons:: Other: (patient declined medication review-- stated not at home) Any new allergies since your discharge?: No Dietary orders reviewed?: No (patient declined) Do you have support at home?: Yes People in Home: sibling(s) Name of Support/Comfort Primary Source: Reports independent in self-care activities; resides withy her sister who assists as/ if needed/ indicated  Medications Reviewed Today: Medications Reviewed Today     Reviewed by Michaela Corner, RN (Registered Nurse) on 03/04/23 at 1213  Med List Status: <None>   Medication Order Taking? Sig Documenting Provider Last Dose Status Informant  acetaminophen (TYLENOL) 500 MG tablet [id number] No Take 1,000 mg by mouth 2 (two) times daily as needed for headache, fever, moderate pain or mild pain. [provider] Taking Active Self  albuterol (PROVENTIL) (2.5 MG/3ML) 0.083% nebulizer solution [id number] No Take 3 mLs (2.5 mg total) by nebulization every 6 (six)  hours as needed for wheezing or shortness of breath. Oretha Milch, MD Taking Active Self, Pharmacy Records           Med Note (COFFELL, Herb Grays Nov 07, 2022  5:01 PM) Pt states she used both albuterol AND Duoneb this morning.  albuterol (VENTOLIN HFA) 108 (90 Base) MCG/ACT inhaler [id number] No INHALE 1-2 PUFFS BY MOUTH EVERY 6 HOURS AS NEEDED FOR WHEEZE OR SHORTNESS OF Gordy Savers, MD Taking Active   amLODipine (NORVASC) 5 MG tablet [id number] No Take 1 tablet (5 mg total) by mouth daily.  Patient taking differently: Take 5 mg by mouth in the morning.   Jake Bathe, MD Taking Active Self, Pharmacy Records  atorvastatin (LIPITOR) 40 MG tablet [id number] No TAKE 1 TABLET BY MOUTH EVERY DAY  Patient taking differently: Take 40 mg by mouth in the morning.   Corwin Levins, MD Taking Active Self, Pharmacy Records  azithromycin [facility]) 250 MG tablet [id number]  Take 1 tablet (250 mg total) by mouth daily. Take first 2 tablets together, then 1 every day until finished. Carlisle Beers, FNP  Active   benzonatate (TESSALON) 100 MG capsule [id number]  Take 1 capsule (100 mg total) by mouth every 8 (eight) hours. Carlisle Beers, FNP  Active   bisoprolol (ZEBETA) 5 MG tablet [id number] No Take 1 tablet (5 mg total) by mouth daily. Jake Bathe, MD Taking Active   Cholecalciferol (VITAMIN D-3 PO) [id number] No Take 1 capsule by mouth in the morning. [provider] Taking Active Self  Cyanocobalamin (VITAMIN B-12 PO) [id number] No Take 1 tablet by mouth in the morning. [provider] Taking Active Self  Dulaglutide (TRULICITY) 0.75 MG/0.5ML  SOPN [id number] No Inject 0.75 mg into the skin once a week. Corwin Levins, MD Taking Active Self, Pharmacy Records  fluticasone [facility]) 50 MCG/ACT nasal spray [id number] No Place 2 sprays into both nostrils daily.  Patient taking differently: Place 2 sprays into both nostrils in the morning.   Pincus Sanes, MD Taking  Active Self, Pharmacy Records  furosemide (LASIX) 20 MG tablet [id number] No Take 1 tablet (20 mg total) by mouth daily for 3 days. Baldwin Jamaica, MD Taking Expired 11/12/22 2359   ipratropium-albuterol (DUONEB) 0.5-2.5 (3) MG/3ML SOLN [id number] No Take 3 mLs by nebulization every 6 (six) hours as needed (wheezing, shortness of breath). [provider] Taking Active Self, Pharmacy Records           Med Note (COFFELL, Herb Grays Nov 07, 2022  5:07 PM) Pt states she used both albuterol AND Duoneb this morning.  isosorbide mononitrate (IMDUR) 60 MG 24 hr tablet [id number] No Take 1.5 tablets (90 mg total) by mouth daily.  Patient taking differently: Take 90 mg by mouth at bedtime.   Jake Bathe, MD Taking Active Self, Pharmacy Records  MAGNESIUM OXIDE PO [id number] No Take 1 tablet by mouth at bedtime. [provider] Taking Active Self  metFORMIN (GLUCOPHAGE-XR) 500 MG 24 hr tablet [id number] No TAKE 1 TABLET BY MOUTH EVERY DAY WITH BREAKFAST Corwin Levins, MD Taking Active   methocarbamol (ROBAXIN) 500 MG tablet [id number]  Take 1 tablet (500 mg total) by mouth 2 (two) times daily. Carlisle Beers, FNP  Active   Multiple Vitamins-Minerals (MULTIVITAMIN WOMEN 50+) TABS [id number] No Take 1 tablet by mouth in the morning. [provider] Taking Active Self  nicotine (NICOTROL) 10 MG inhaler 1 continuous puffing [id number]   Oretha Milch, MD  Active   oxyCODONE-acetaminophen (PERCOCET/ROXICET) 5-325 MG tablet [id number]  Take 1 tablet by mouth every 6 (six) hours as needed for severe pain. Corwin Levins, MD  Active   predniSONE (DELTASONE) 10 MG tablet [id number] No prednisone 10 mg tablets, take 1 to 2 tablets daily #60 Oretha Milch, MD Taking Active   predniSONE (DELTASONE) 10 MG tablet [id number] No Take 0.5 tablets (5 mg total) by mouth daily with breakfast. Oretha Milch, MD Taking Active   regadenoson [facility]) injection SOLN 0.4 mg [id number]   Pricilla Riffle, MD   Active   technetium tetrofosmin (TC-MYOVIEW) injection 30 millicurie [id number]   Pricilla Riffle, MD  Active   tirzepatide [facility]) 5 MG/0.5ML Pen [id number]  Inject 5 mg into the skin once a week. Corwin Levins, MD  Active   Harrel Carina ELLIPTA 200-62.5-25 MCG/ACT AEPB [id number] No INHALE 1 PUFF BY MOUTH EVERY DAY Glenford Bayley, NP Taking Active             Home Care and Equipment/Supplies: Were Home Health Services Ordered?: No Any new equipment or medical supplies ordered?: No  Functional Questionnaire: Do you need assistance with bathing/showering or dressing?: No Do you need assistance with meal preparation?: No Do you need assistance with eating?: No Do you have difficulty maintaining continence: No Do you need assistance with getting out of bed/getting out of a chair/moving?: No Do you have difficulty managing or taking your medications?: No  Follow up appointments reviewed: PCP Follow-up appointment confirmed?: NA Specialist Hospital Follow-up appointment confirmed?: Yes Date of Specialist follow-up appointment?: 03/04/23 Follow-Up Specialty Provider:: sports medicine provider Do you need transportation to your follow-up appointment?: No Do you  understand care options if your condition(s) worsen?: Yes-patient verbalized understanding  SDOH Interventions Today    Flowsheet Row Most Recent Value  SDOH Interventions   Transportation Interventions Intervention Not Indicated  [reports sister provides transportation]      TOC Interventions Today    Flowsheet Row Most Recent Value  TOC Interventions   TOC Interventions Discussed/Reviewed TOC Interventions Discussed      Interventions Today    Flowsheet Row Most Recent Value  Chronic Disease   Chronic disease during today's visit Other  [back pain/ MSK spasms]  General Interventions   General Interventions Discussed/Reviewed General Interventions Discussed, Doctor Visits  Doctor Visits Discussed/Reviewed  Specialist, Doctor Visits Discussed, Doctor Visits Reviewed  Kingsley Callander reviewed sports medicine provider office visit this afternoon]  PCP/Specialist Visits Compliance with follow-up visit  Pharmacy Interventions   Pharmacy Dicussed/Reviewed Pharmacy Topics Discussed  Safety Interventions   Safety Discussed/Reviewed Safety Discussed      Caryl Pina, RN, BSN, CCRN Alumnus RN CM Care Coordination/ Transition of Care- [facility] Care Management [phone]: direct office

## 2023-03-04 NOTE — Progress Notes (Signed)
[name] D.[name] [address] [place] [address] Phone: [phone]   Assessment and Plan:     1. Chronic right-sided thoracic back pain -Chronic with exacerbation, initial sports Fleming visit - Most consistent with exacerbation of chronic back pain.  This exacerbation is most consistent with thoracic paraspinal, costochondral muscular flare - Patient has tried multiple medications without benefit, so we will change her current pain regimen plan.  Recommend only taking the following medications in terms of acute muscular flare:  - Start meloxicam 15 mg daily x2 weeks.  If still having pain after 2 weeks, complete 3rd-week of meloxicam. May use remaining meloxicam as needed once daily for pain control.  Do not to use additional NSAIDs while taking meloxicam.   -Start Tylenol [phone] mg 2 to 3 times a day for breakthrough pain. - Start Flexeril 5 mg 1-2 times a day as needed for muscle spasms or breakthrough pain  - Start HEP for back - Patient elected for IM injection of methylprednisone 80 mg/Toradol 60 mg.  Injection given in clinic today and tolerated well.   Other orders - meloxicam (MOBIC) 15 MG tablet; Take 1 tablet (15 mg total) by mouth daily. - cyclobenzaprine (FLEXERIL) 5 MG tablet; Take 1 tablet (5 mg total) by mouth 2 (two) times daily as needed for muscle spasms.    Pertinent previous records reviewed include chest x-ray 02/24/2023, thoracic spine x-ray 02/17/2023, lumbar spine x-ray 02/17/2023   Follow Up: 4 weeks for reevaluation.  If no improvement or worsening of symptoms, could consider advanced imaging versus physical therapy versus trigger point injections   Subjective:   I, [name], am serving as a Neurosurgeon for Doctor Richardean Sale  Chief Complaint: low back pain   HPI:   03/04/23 Patient is a 67 year old female complaining of low back pain. Patient states ongoing for approximately 10 to 14 days. She  has been seen by her PCP in urgent care and diagnosed with muscle spasms. She was prescribed oxycodone which provided no relief. She was also given Robaxin. States that she continues to have spasming pain in her back. Pain is worse with certain movements. Pain worsens  with sitting or being still. She does intermittently get muscle spasms while sitting down as well. She denies any associated abdominal pain but reports that the pain can radiate across her back and at times around to her right side/abdomen. Pan seems to alleviate once she stands and warms up to walk around, pain is constant, she has a hard time breathing , she states she can feel the knots and contractions when she has pain flares, has tried all creams and massagers   Relevant Historical Information: Hypertension, CAD, COPD, DM type II  Additional pertinent review of systems negative.   Current Outpatient Medications:    acetaminophen (TYLENOL) 500 MG tablet, Take 1,000 mg by mouth 2 (two) times daily as needed for headache, fever, moderate pain or mild pain., Disp: , Rfl:    albuterol (PROVENTIL) (2.5 MG/3ML) 0.083% nebulizer solution, Take 3 mLs (2.5 mg total) by nebulization every 6 (six) hours as needed for wheezing or shortness of breath., Disp: 75 mL, Rfl: 12   albuterol (VENTOLIN HFA) 108 (90 Base) MCG/ACT inhaler, INHALE 1-2 PUFFS BY MOUTH EVERY 6 HOURS AS NEEDED FOR WHEEZE OR SHORTNESS OF BREATH, Disp: 8.5 each, Rfl: 5   amLODipine (NORVASC) 5 MG tablet, Take 1 tablet (5 mg total) by mouth daily. (Patient taking differently:  Take 5 mg by mouth in the morning.), Disp: 90 tablet, Rfl: 3   atorvastatin (LIPITOR) 40 MG tablet, TAKE 1 TABLET BY MOUTH EVERY DAY (Patient taking differently: Take 40 mg by mouth in the morning.), Disp: 90 tablet, Rfl: 3   azithromycin (ZITHROMAX) 250 MG tablet, Take 1 tablet (250 mg total) by mouth daily. Take first 2 tablets together, then 1 every day until finished., Disp: 6 tablet, Rfl: 0    benzonatate (TESSALON) 100 MG capsule, Take 1 capsule (100 mg total) by mouth every 8 (eight) hours., Disp: 21 capsule, Rfl: 0   bisoprolol (ZEBETA) 5 MG tablet, Take 1 tablet (5 mg total) by mouth daily., Disp: 90 tablet, Rfl: 3   Cholecalciferol (VITAMIN D-3 PO), Take 1 capsule by mouth in the morning., Disp: , Rfl:    Cyanocobalamin (VITAMIN B-12 PO), Take 1 tablet by mouth in the morning., Disp: , Rfl:    cyclobenzaprine (FLEXERIL) 5 MG tablet, Take 1 tablet (5 mg total) by mouth 2 (two) times daily as needed for muscle spasms., Disp: 30 tablet, Rfl: 0   Dulaglutide (TRULICITY) 0.75 MG/0.5ML SOPN, Inject 0.75 mg into the skin once a week., Disp: 6 mL, Rfl: 0   fluticasone (FLONASE) 50 MCG/ACT nasal spray, Place 2 sprays into both nostrils daily. (Patient taking differently: Place 2 sprays into both nostrils in the morning.), Disp: 16 g, Rfl: 6   ipratropium-albuterol (DUONEB) 0.5-2.5 (3) MG/3ML SOLN, Take 3 mLs by nebulization every 6 (six) hours as needed (wheezing, shortness of breath)., Disp: , Rfl:    isosorbide mononitrate (IMDUR) 60 MG 24 hr tablet, Take 1.5 tablets (90 mg total) by mouth daily. (Patient taking differently: Take 90 mg by mouth at bedtime.), Disp: 135 tablet, Rfl: 3   MAGNESIUM OXIDE PO, Take 1 tablet by mouth at bedtime., Disp: , Rfl:    meloxicam (MOBIC) 15 MG tablet, Take 1 tablet (15 mg total) by mouth daily., Disp: 30 tablet, Rfl: 0   metFORMIN (GLUCOPHAGE-XR) 500 MG 24 hr tablet, TAKE 1 TABLET BY MOUTH EVERY DAY WITH BREAKFAST, Disp: 90 tablet, Rfl: 2   methocarbamol (ROBAXIN) 500 MG tablet, Take 1 tablet (500 mg total) by mouth 2 (two) times daily., Disp: 20 tablet, Rfl: 0   Multiple Vitamins-Minerals (MULTIVITAMIN WOMEN 50+) TABS, Take 1 tablet by mouth in the morning., Disp: , Rfl:    oxyCODONE-acetaminophen (PERCOCET/ROXICET) 5-325 MG tablet, Take 1 tablet by mouth every 6 (six) hours as needed for severe pain., Disp: 30 tablet, Rfl: 0   predniSONE (DELTASONE) 10 MG  tablet, prednisone 10 mg tablets, take 1 to 2 tablets daily #60, Disp: 20 tablet, Rfl: 2   predniSONE (DELTASONE) 10 MG tablet, Take 0.5 tablets (5 mg total) by mouth daily with breakfast., Disp: 30 tablet, Rfl: 1   tirzepatide (MOUNJARO) 5 MG/0.5ML Pen, Inject 5 mg into the skin once a week., Disp: 6 mL, Rfl: 11   TRELEGY ELLIPTA 200-62.5-25 MCG/ACT AEPB, INHALE 1 PUFF BY MOUTH EVERY DAY, Disp: 60 each, Rfl: 4   furosemide (LASIX) 20 MG tablet, Take 1 tablet (20 mg total) by mouth daily for 3 days., Disp: 3 tablet, Rfl: 0  Current Facility-Administered Medications:    nicotine (NICOTROL) 10 MG inhaler 1 continuous puffing, 1 continuous puffing, Inhalation, PRN, Oretha Milch, MD  Facility-Administered Medications Ordered in Other Visits:    regadenoson (LEXISCAN) injection SOLN 0.4 mg, 0.4 mg, Intravenous, Once, Pricilla Riffle, MD   technetium tetrofosmin (TC-MYOVIEW) injection 30 millicurie, 30 millicurie, Intravenous,  Once PRN, Pricilla Riffle, MD   Objective:     Vitals:   03/04/23 1334  BP: 132/80  Pulse: 86  SpO2: 97%  Weight: 187 lb (84.8 kg)  Height: 5\' 1"  (1.549 m)      Body mass index is 35.33 kg/m.    Physical Exam:    Gen: Appears well, nad, nontoxic and pleasant Psych: Alert and oriented, appropriate mood and affect Neuro: sensation intact, strength is 5/5 in upper and lower extremities, muscle tone wnl Skin: no susupicious lesions or rashes  Back - Normal skin, Spine with normal alignment and no deformity.   Mild tenderness to vertebral process palpation.   Bilateral thoracic and lumbar paraspinous muscles are moderately tender and without spasm, worse on right NTTP gluteal musculature Straight leg raise negative Pain worsened with back rotation and extension.  No pain with flexion   Electronically signed by:  [name] D.[name] 2:45 PM 03/04/23

## 2023-03-04 NOTE — Patient Instructions (Signed)
-   Start meloxicam 15 mg daily x2 weeks.  If still having pain after 2 weeks, complete 3rd-week of meloxicam. May use remaining meloxicam as needed once daily for pain control.  Do not to use additional NSAIDs while taking meloxicam.  May use Tylenol [phone] mg 2 to 3 times a day for breakthrough pain. Flexeril 5 mg 2x a day as needed for muscle spasm Thoracic HEP  3-4 week follow up

## 2023-03-06 ENCOUNTER — Other Ambulatory Visit: Payer: Medicare HMO

## 2023-03-06 VITALS — BP 116/58 | HR 62 | Temp 97.6°F

## 2023-03-06 DIAGNOSIS — Z515 Encounter for palliative care: Secondary | ICD-10-CM

## 2023-03-06 NOTE — Progress Notes (Signed)
PATIENT NAME: [name] DOB: 19-Feb-1956 MRN: [id number]  PRIMARY CARE PROVIDER: Corwin Levins, MD  RESPONSIBLE PARTY:  Acct ID - Guarantor Home Phone Work Phone Relationship Acct Type  [id number] [name], [place]* [phone] Self P/F     [address] [place], [place] [address]   Palliative Care Follow Up Encounter Note    Completed home visit.      Today's Visit:     Respiratory: little SOB today; every now and then will take a deep breath; now has a non invasive ventilation machine is working well because she feels she is rested when she awakens in the morning;    Cognitive: alert and oriented x4; reports better cognition since getting NIV machine   Appetite: eats 2 full meals, snacks and more due to steroids   GI/GU: still has occasional constipation; has several BMs per week; last BM on 03/05/23; no urinary issues but she has occasional accidents due to not being able to get to the bathroom in time after waking up because of the pain; encouraged pt to use a pad to assist in catching the urine; pt acknowledged   Mobility: walks unassisted and still wants a transport wheelchair for outside activities   ADLs: able to clean her room and sometimes needs assist; sister does most of house cleaning; independent with personal grooming   Cardiac: reports CHF; takes her fluid pill but when she doesn't she get edema to BLE; gave CHF booklet and discussed weight gain   Sleeping Pattern: sleeps throughout the night with the NIV but not using it as much   Pain: 6/10 pain at this time in her Left side that radiates to her back; she is now going to a sports medicine facility and she has been prescribed more pain meds; she has also had 2 shots in her hips   Wt: 185 lbs on 02/05/23; 187 lbs as of 03/04/23   Diabetes: FSBS 134 1h post prandial); still feels it's controlled with meds       Goals of Care: To stay in the home with her sister   CODE STATUS: Full  Code ADVANCED DIRECTIVES: N MOST FORM: N PPS: 90%   PHYSICAL EXAM:   VITALS: Today's Vitals   03/06/23 1304  BP: (!) 116/58  Pulse: 62  Temp: 97.6 F (36.4 C)  SpO2: 97%  PainSc: 6   PainLoc: Back    LUNGS: clear to auscultation , decreased breath sounds CARDIAC: Cor RRR EXTREMITIES: AROM x4 SKIN: Skin color, texture, turgor normal. No rashes or lesions  NEURO: negative except for weakness       Preslea Rhodus Clementeen Graham, LPN

## 2023-03-10 ENCOUNTER — Other Ambulatory Visit: Payer: Self-pay | Admitting: Sports Medicine

## 2023-03-10 ENCOUNTER — Other Ambulatory Visit: Payer: Self-pay | Admitting: Internal Medicine

## 2023-03-11 ENCOUNTER — Encounter: Payer: Self-pay | Admitting: Internal Medicine

## 2023-03-11 ENCOUNTER — Ambulatory Visit (INDEPENDENT_AMBULATORY_CARE_PROVIDER_SITE_OTHER): Payer: Medicare HMO | Admitting: Internal Medicine

## 2023-03-11 VITALS — BP 118/64 | HR 80 | Temp 98.4°F | Ht 61.0 in | Wt 189.0 lb

## 2023-03-11 DIAGNOSIS — E1165 Type 2 diabetes mellitus with hyperglycemia: Secondary | ICD-10-CM

## 2023-03-11 DIAGNOSIS — Z7984 Long term (current) use of oral hypoglycemic drugs: Secondary | ICD-10-CM | POA: Diagnosis not present

## 2023-03-11 DIAGNOSIS — J4489 Other specified chronic obstructive pulmonary disease: Secondary | ICD-10-CM

## 2023-03-11 DIAGNOSIS — Z720 Tobacco use: Secondary | ICD-10-CM

## 2023-03-11 DIAGNOSIS — E559 Vitamin D deficiency, unspecified: Secondary | ICD-10-CM | POA: Diagnosis not present

## 2023-03-11 DIAGNOSIS — E785 Hyperlipidemia, unspecified: Secondary | ICD-10-CM

## 2023-03-11 DIAGNOSIS — Z7985 Long-term (current) use of injectable non-insulin antidiabetic drugs: Secondary | ICD-10-CM

## 2023-03-11 DIAGNOSIS — M5441 Lumbago with sciatica, right side: Secondary | ICD-10-CM

## 2023-03-11 NOTE — Assessment & Plan Note (Signed)
Lab Results  Component Value Date   HGBA1C 7.6 (H) 02/17/2023   Mild uncontrolled, goal A1c < 7, , pt to continue current medical treatment trulicity 0.75 q wk, metformin ER 500 qd and now increased mounjaro 5 qwk, for f/u lab next visit

## 2023-03-11 NOTE — Telephone Encounter (Signed)
Patient needs to start a Prednisone taper.  She wants to know if it will be ok with the medications you have her on now.  Patient is in the office now and will be here for about an hour.  Is requesting an answer before she leaves here today.

## 2023-03-11 NOTE — Assessment & Plan Note (Signed)
Currently resolved, cont to follow

## 2023-03-11 NOTE — Patient Instructions (Signed)
Ok to increase the mounjaro 5 mg weekly  Please continue all other medications as before, and refills have been done if requested.  Please have the pharmacy call with any other refills you may need.  Please keep your appointments with your specialists as you may have planned  Please go to the LAB at the blood drawing area for the tests to be done - mid June or later  You will be contacted by phone if any changes need to be made immediately.  Otherwise, you will receive a letter about your results with an explanation, but please check with MyChart first.  Please remember to sign up for MyChart if you have not done so, as this will be important to you in the future with finding out test results, communicating by private email, and scheduling acute appointments online when needed.  Please make an Appointment to return in 4 months, or sooner if needed

## 2023-03-11 NOTE — Telephone Encounter (Signed)
Message was given to front desk to notify patient and they will discuss with her when she comes down from PCP visit

## 2023-03-11 NOTE — Assessment & Plan Note (Signed)
With recent exacerbation resolved, cont current inhalers

## 2023-03-11 NOTE — Progress Notes (Signed)
Patient ID: [name], female   DOB: 02/08/1956, 67 y.o.   MRN: [id number]        Chief Complaint: follow up HTN, HLD and DM, low vit d       HPI:  [name] is a 67 y.o. female here overall doing well; Pt denies chest pain, increased sob or doe, wheezing, orthopnea, PND, increased LE swelling, palpitations, dizziness or syncope.   Pt denies polydipsia, polyuria, or new focal neuro s/s.    Pt denies fever, wt loss, night sweats, loss of appetite, or other constitutional symptoms  right lower back pain recently improved.  Did have recent copd exacerbation now resolved as well.  No new complaints.  Still smoking, not ready to quit       Wt Readings from Last 3 Encounters:  03/11/23 189 lb (85.7 kg)  03/04/23 187 lb (84.8 kg)  02/27/23 185 lb (83.9 kg)   BP Readings from Last 3 Encounters:  03/11/23 118/64  03/06/23 (!) 116/58  03/04/23 132/80         Past Medical History:  Diagnosis Date   Bronchitis    CAD (coronary artery disease)    a. 02/2002: CABG x3 with LIMA to LAD, RIMA to RCA, and SVG to OM   CHF (congestive heart failure) (HCC)    COPD (chronic obstructive pulmonary disease) (HCC)    Diabetes (HCC)    Headache    Heart disease    Hyperlipidemia    Hypertension    Hypertensive emergency 07/01/2013   Impaired glucose tolerance 08/20/2014   Sinusitis    Past Surgical History:  Procedure Laterality Date   CORONARY ARTERY BYPASS GRAFT  2003   Triple bypass   ESOPHAGOGASTRODUODENOSCOPY Left 06/13/2014   Procedure: ESOPHAGOGASTRODUODENOSCOPY (EGD);  Surgeon: Charna Elizabeth, MD;  Location: [facility] ENDOSCOPY;  Service: Endoscopy;  Laterality: Left;   RIGID ESOPHAGOSCOPY N/A 06/12/2014   Procedure: RIGID ESOPHAGOSCOPY WITH FOREIGN BODY REMOVAL;  Surgeon: Darletta Moll, MD;  Location: [facility] OR;  Service: ENT;  Laterality: N/A;    reports that she has been smoking cigarettes. She started smoking about 51 years ago. She has a 23.50 pack-year smoking history. She has never used  smokeless tobacco. She reports current alcohol use of about 5.0 - 6.0 standard drinks of alcohol per week. She reports that she does not currently use drugs after having used the following drugs: Marijuana. family history includes Brain cancer in her mother; Cancer in her maternal grandmother and another family member; Heart disease in her mother; Lung cancer in her maternal aunt and paternal uncle; Stroke in an other family member; Suicidality in her brother. Allergies  Allergen Reactions   Augmentin [Amoxicillin-Pot Clavulanate] Nausea And Vomiting   Chantix [Varenicline] Nausea And Vomiting   Current Outpatient Medications on File Prior to Visit  Medication Sig Dispense Refill   acetaminophen (TYLENOL) 500 MG tablet Take 1,000 mg by mouth 2 (two) times daily as needed for headache, fever, moderate pain or mild pain.     albuterol (PROVENTIL) (2.5 MG/3ML) 0.083% nebulizer solution Take 3 mLs (2.5 mg total) by nebulization every 6 (six) hours as needed for wheezing or shortness of breath. 75 mL 12   albuterol (VENTOLIN HFA) 108 (90 Base) MCG/ACT inhaler INHALE 1-2 PUFFS BY MOUTH EVERY 6 HOURS AS NEEDED FOR WHEEZE OR SHORTNESS OF BREATH 8.5 each 5   amLODipine (NORVASC) 5 MG tablet Take 1 tablet (5 mg total) by mouth daily. (Patient taking differently: Take 5 mg by mouth  in the morning.) 90 tablet 3   atorvastatin (LIPITOR) 40 MG tablet TAKE 1 TABLET BY MOUTH EVERY DAY (Patient taking differently: Take 40 mg by mouth in the morning.) 90 tablet 3   azithromycin (ZITHROMAX) 250 MG tablet Take 1 tablet (250 mg total) by mouth daily. Take first 2 tablets together, then 1 every day until finished. 6 tablet 0   benzonatate (TESSALON) 100 MG capsule Take 1 capsule (100 mg total) by mouth every 8 (eight) hours. 21 capsule 0   bisoprolol (ZEBETA) 5 MG tablet Take 1 tablet (5 mg total) by mouth daily. 90 tablet 3   Cholecalciferol (VITAMIN D-3 PO) Take 1 capsule by mouth in the morning.     Cyanocobalamin  (VITAMIN B-12 PO) Take 1 tablet by mouth in the morning.     cyclobenzaprine (FLEXERIL) 5 MG tablet Take 1 tablet (5 mg total) by mouth 2 (two) times daily as needed for muscle spasms. 30 tablet 0   Dulaglutide (TRULICITY) 0.75 MG/0.5ML SOPN Inject 0.75 mg into the skin once a week. 6 mL 0   fluticasone (FLONASE) 50 MCG/ACT nasal spray Place 2 sprays into both nostrils daily. (Patient taking differently: Place 2 sprays into both nostrils in the morning.) 16 g 6   ipratropium-albuterol (DUONEB) 0.5-2.5 (3) MG/3ML SOLN Take 3 mLs by nebulization every 6 (six) hours as needed (wheezing, shortness of breath).     isosorbide mononitrate (IMDUR) 60 MG 24 hr tablet Take 1.5 tablets (90 mg total) by mouth daily. (Patient taking differently: Take 90 mg by mouth at bedtime.) 135 tablet 3   MAGNESIUM OXIDE PO Take 1 tablet by mouth at bedtime.     meloxicam (MOBIC) 15 MG tablet Take 1 tablet (15 mg total) by mouth daily. 30 tablet 0   metFORMIN (GLUCOPHAGE-XR) 500 MG 24 hr tablet TAKE 1 TABLET BY MOUTH EVERY DAY WITH BREAKFAST 90 tablet 2   methocarbamol (ROBAXIN) 500 MG tablet Take 1 tablet (500 mg total) by mouth 2 (two) times daily. 20 tablet 0   Multiple Vitamins-Minerals (MULTIVITAMIN WOMEN 50+) TABS Take 1 tablet by mouth in the morning.     oxyCODONE-acetaminophen (PERCOCET/ROXICET) 5-325 MG tablet Take 1 tablet by mouth every 6 (six) hours as needed for severe pain. 30 tablet 0   predniSONE (DELTASONE) 10 MG tablet prednisone 10 mg tablets, take 1 to 2 tablets daily #60 20 tablet 2   predniSONE (DELTASONE) 10 MG tablet Take 0.5 tablets (5 mg total) by mouth daily with breakfast. 30 tablet 1   tirzepatide (MOUNJARO) 5 MG/0.5ML Pen Inject 5 mg into the skin once a week. 6 mL 11   TRELEGY ELLIPTA 200-62.5-25 MCG/ACT AEPB INHALE 1 PUFF BY MOUTH EVERY DAY 60 each 4   furosemide (LASIX) 20 MG tablet Take 1 tablet (20 mg total) by mouth daily for 3 days. 3 tablet 0   Current Facility-Administered Medications  on File Prior to Visit  Medication Dose Route Frequency Provider Last Rate Last Admin   nicotine (NICOTROL) 10 MG inhaler 1 continuous puffing  1 continuous puffing Inhalation PRN Oretha Milch, MD       regadenoson (LEXISCAN) injection SOLN 0.4 mg  0.4 mg Intravenous Once Pricilla Riffle, MD       technetium tetrofosmin (TC-MYOVIEW) injection 30 millicurie  30 millicurie Intravenous Once PRN Pricilla Riffle, MD            ROS:  All others reviewed and negative.  Objective  PE:  BP 118/64 (BP Location: Right Arm, Patient Position: Sitting, Cuff Size: Normal)   Pulse 80   Temp 98.4 F (36.9 C) (Oral)   Ht 5\' 1"  (1.549 m)   Wt 189 lb (85.7 kg)   SpO2 99%   BMI 35.71 kg/m                 Constitutional: Pt appears in NAD               HENT: Head: NCAT.                Right Ear: External ear normal.                 Left Ear: External ear normal.                Eyes: . Pupils are equal, round, and reactive to light. Conjunctivae and EOM are normal               Nose: without d/c or deformity               Neck: Neck supple. Gross normal ROM               Cardiovascular: Normal rate and regular rhythm.                 Pulmonary/Chest: Effort normal and breath sounds without rales or wheezing.                Abd:  Soft, NT, ND, + BS, no organomegaly               Neurological: Pt is alert. At baseline orientation, motor grossly intact               Skin: Skin is warm. No rashes, no other new lesions, LE edema - none               Psychiatric: Pt behavior is normal without agitation   Micro: none  Cardiac tracings I have personally interpreted today:  none  Pertinent Radiological findings (summarize): none   Lab Results  Component Value Date   WBC 12.0 (H) 02/27/2023   HGB 12.7 02/27/2023   HCT 41.7 02/27/2023   PLT 223 02/27/2023   GLUCOSE 144 (H) 02/27/2023   CHOL 182 02/17/2023   TRIG 124.0 02/17/2023   HDL 47.20 02/17/2023   LDLDIRECT 79.0 06/28/2022   LDLCALC 110  (H) 02/17/2023   ALT 21 02/27/2023   AST 21 02/27/2023   NA 139 02/27/2023   K 3.7 02/27/2023   CL 94 (L) 02/27/2023   CREATININE 0.54 02/27/2023   BUN 10 02/27/2023   CO2 33 (H) 02/27/2023   TSH 0.60 02/17/2023   HGBA1C 7.6 (H) 02/17/2023   MICROALBUR 6.0 (H) 02/17/2023   Assessment/Plan:  [name] is a 67 y.o. Other or two or more races [6] female with  has a past medical history of Bronchitis, CAD (coronary artery disease), CHF (congestive heart failure) (HCC), COPD (chronic obstructive pulmonary disease) (HCC), Diabetes (HCC), Headache, Heart disease, Hyperlipidemia, Hypertension, Hypertensive emergency (07/01/2013), Impaired glucose tolerance (08/20/2014), and Sinusitis.  Vitamin D deficiency Last vitamin D Lab Results  Component Value Date   VD25OH 24.60 (L) 02/17/2023   Low, reminded to start oral replacement   HLD (hyperlipidemia) Lab Results  Component Value Date   LDLCALC 110 (H) 02/17/2023   Uncontrolled,, pt to continue now lipitor 40 mg, f/u lipid panel with next lab, goal  ldl < 70   Tobacco abuse Pt counsled to quit, pt not ready  COPD (chronic obstructive pulmonary disease) with chronic bronchitis (HCC) With recent exacerbation resolved, cont current inhalers  Diabetes (HCC) Lab Results  Component Value Date   HGBA1C 7.6 (H) 02/17/2023   Mild uncontrolled, goal A1c < 7, , pt to continue current medical treatment trulicity 0.75 q wk, metformin ER 500 qd and now increased mounjaro 5 qwk, for f/u lab next visit   Low back pain Currently resolved, cont to follow  Followup: Return in about 4 months (around 07/12/2023).  Oliver Barre, MD 03/11/2023 8:44 PM [facility]

## 2023-03-11 NOTE — Assessment & Plan Note (Signed)
Pt counsled to quit, pt not ready °

## 2023-03-11 NOTE — Assessment & Plan Note (Signed)
Last vitamin D Lab Results  Component Value Date   VD25OH 24.60 (L) 02/17/2023   Low, reminded to start oral replacement

## 2023-03-11 NOTE — Assessment & Plan Note (Addendum)
Lab Results  Component Value Date   LDLCALC 110 (H) 02/17/2023   Uncontrolled,, pt to continue now lipitor 40 mg, f/u lipid panel with next lab, goal ldl < 70

## 2023-03-15 DIAGNOSIS — J449 Chronic obstructive pulmonary disease, unspecified: Secondary | ICD-10-CM | POA: Diagnosis not present

## 2023-03-16 DIAGNOSIS — J449 Chronic obstructive pulmonary disease, unspecified: Secondary | ICD-10-CM | POA: Diagnosis not present

## 2023-03-17 ENCOUNTER — Other Ambulatory Visit: Payer: Self-pay | Admitting: Pulmonary Disease

## 2023-03-18 NOTE — Progress Notes (Signed)
Aleen Sells [name] [address] [place] [address] Phone: [phone]   Assessment and Plan:     1. Chronic right-sided thoracic back pain - Chronic with exacerbation, subsequent visit - Significant improvement after completing 3-week course of meloxicam and as needed use of Flexeril since previous office visit.  Still most consistent with exacerbation of thoracic paraspinal and costochondral muscular flares - Discontinue meloxicam and Flexeril at this time.  May use remaining medications as needed for breakthrough - If pain returns, could consider repeat course of meloxicam and Flexeril.  Ideally we would wait at least 3 months in between prescription courses - May continue Tylenol as needed for day-to-day pain relief - Encouraged to continue HEP for back.  Offered physical therapy which patient declined at this time, but will call back if she wants to start PT - Recommend elliptical, stationary bike, water aerobics as forms of physical activity   Pertinent previous records reviewed include none   Follow Up: As needed   Subjective:   I, Jerene Canny, am serving as a Neurosurgeon for Doctor Richardean Sale   Chief Complaint: low back pain    HPI:    03/04/23 Patient is a 67 year old female complaining of low back pain. Patient states ongoing for approximately 10 to 14 days. She has been seen by her PCP in urgent care and diagnosed with muscle spasms. She was prescribed oxycodone which provided no relief. She was also given Robaxin. States that she continues to have spasming pain in her back. Pain is worse with certain movements. Pain worsens  with sitting or being still. She does intermittently get muscle spasms while sitting down as well. She denies any associated abdominal pain but reports that the pain can radiate across her back and at times around to her right side/abdomen. Pan seems to alleviate once she stands and warms up to walk  around, pain is constant, she has a hard time breathing , she states she can feel the knots and contractions when she has pain flares, has tried all creams and massagers   03/24/2023 Patient states  that she is good , back is much better   Relevant Historical Information: Hypertension, CAD, COPD, DM type II  Additional pertinent review of systems negative.   Current Outpatient Medications:    acetaminophen (TYLENOL) 500 MG tablet, Take 1,000 mg by mouth 2 (two) times daily as needed for headache, fever, moderate pain or mild pain., Disp: , Rfl:    albuterol (PROVENTIL) (2.5 MG/3ML) 0.083% nebulizer solution, Take 3 mLs (2.5 mg total) by nebulization every 6 (six) hours as needed for wheezing or shortness of breath., Disp: 75 mL, Rfl: 12   albuterol (VENTOLIN HFA) 108 (90 Base) MCG/ACT inhaler, INHALE 1-2 PUFFS BY MOUTH EVERY 6 HOURS AS NEEDED FOR WHEEZE OR SHORTNESS OF BREATH, Disp: 8.5 each, Rfl: 5   amLODipine (NORVASC) 5 MG tablet, Take 1 tablet (5 mg total) by mouth daily. (Patient taking differently: Take 5 mg by mouth in the morning.), Disp: 90 tablet, Rfl: 3   atorvastatin (LIPITOR) 40 MG tablet, TAKE 1 TABLET BY MOUTH EVERY DAY (Patient taking differently: Take 40 mg by mouth in the morning.), Disp: 90 tablet, Rfl: 3   azithromycin (ZITHROMAX) 250 MG tablet, Take 1 tablet (250 mg total) by mouth daily. Take first 2 tablets together, then 1 every day until finished., Disp: 6 tablet, Rfl: 0   benzonatate (TESSALON) 100 MG capsule, Take 1 capsule (100  mg total) by mouth every 8 (eight) hours., Disp: 21 capsule, Rfl: 0   bisoprolol (ZEBETA) 5 MG tablet, Take 1 tablet (5 mg total) by mouth daily., Disp: 90 tablet, Rfl: 3   Cholecalciferol (VITAMIN D-3 PO), Take 1 capsule by mouth in the morning., Disp: , Rfl:    Cyanocobalamin (VITAMIN B-12 PO), Take 1 tablet by mouth in the morning., Disp: , Rfl:    cyclobenzaprine (FLEXERIL) 5 MG tablet, Take 1 tablet (5 mg total) by mouth 2 (two) times daily  as needed for muscle spasms., Disp: 30 tablet, Rfl: 0   Dulaglutide (TRULICITY) 0.75 MG/0.5ML SOPN, Inject 0.75 mg into the skin once a week., Disp: 6 mL, Rfl: 0   fluticasone (FLONASE) 50 MCG/ACT nasal spray, Place 2 sprays into both nostrils daily. (Patient taking differently: Place 2 sprays into both nostrils in the morning.), Disp: 16 g, Rfl: 6   ipratropium-albuterol (DUONEB) 0.5-2.5 (3) MG/3ML SOLN, Take 3 mLs by nebulization every 6 (six) hours as needed (wheezing, shortness of breath)., Disp: , Rfl:    isosorbide mononitrate (IMDUR) 60 MG 24 hr tablet, Take 1.5 tablets (90 mg total) by mouth daily. (Patient taking differently: Take 90 mg by mouth at bedtime.), Disp: 135 tablet, Rfl: 3   MAGNESIUM OXIDE PO, Take 1 tablet by mouth at bedtime., Disp: , Rfl:    meloxicam (MOBIC) 15 MG tablet, Take 1 tablet (15 mg total) by mouth daily., Disp: 30 tablet, Rfl: 0   metFORMIN (GLUCOPHAGE-XR) 500 MG 24 hr tablet, TAKE 1 TABLET BY MOUTH EVERY DAY WITH BREAKFAST, Disp: 90 tablet, Rfl: 2   methocarbamol (ROBAXIN) 500 MG tablet, Take 1 tablet (500 mg total) by mouth 2 (two) times daily., Disp: 20 tablet, Rfl: 0   Multiple Vitamins-Minerals (MULTIVITAMIN WOMEN 50+) TABS, Take 1 tablet by mouth in the morning., Disp: , Rfl:    oxyCODONE-acetaminophen (PERCOCET/ROXICET) 5-325 MG tablet, Take 1 tablet by mouth every 6 (six) hours as needed for severe pain., Disp: 30 tablet, Rfl: 0   predniSONE (DELTASONE) 5 MG tablet, Take 1 tablet (5 mg total) by mouth 3 (three) times a week. Mon, Wed, Fri, Disp: 39 tablet, Rfl: 3   tirzepatide (MOUNJARO) 5 MG/0.5ML Pen, Inject 5 mg into the skin once a week., Disp: 6 mL, Rfl: 11   TRELEGY ELLIPTA 200-62.5-25 MCG/ACT AEPB, INHALE 1 PUFF BY MOUTH EVERY DAY, Disp: 60 each, Rfl: 4   furosemide (LASIX) 20 MG tablet, Take 1 tablet (20 mg total) by mouth daily for 3 days., Disp: 3 tablet, Rfl: 0  Current Facility-Administered Medications:    nicotine (NICOTROL) 10 MG inhaler 1  continuous puffing, 1 continuous puffing, Inhalation, PRN, Oretha Milch, MD  Facility-Administered Medications Ordered in Other Visits:    regadenoson (LEXISCAN) injection SOLN 0.4 mg, 0.4 mg, Intravenous, Once, Dietrich Pates V, MD   technetium tetrofosmin (TC-MYOVIEW) injection 30 millicurie, 30 millicurie, Intravenous, Once PRN, Pricilla Riffle, MD   Objective:     Vitals:   03/24/23 1413  BP: 130/68  Pulse: 75  SpO2: 91%  Weight: 193 lb (87.5 kg)  Height: 5\' 1"  (1.549 m)      Body mass index is 36.47 kg/m.    Physical Exam:    Gen: Appears well, nad, nontoxic and pleasant.  Using nasal cannula oxygen. Psych: Alert and oriented, appropriate mood and affect Neuro: sensation intact, strength is 5/5 in upper and lower extremities, muscle tone wnl Skin: no susupicious lesions or rashes  Back - Normal skin, Spine  with normal alignment and no deformity.   No tenderness to vertebral process palpation.   Paraspinous muscles are not tender and without spasm NTTP gluteal musculature Straight leg raise negative No pain with lumbar extension, flexion or rotation.   Electronically signed by:  Aleen Sells [name] 2:46 PM 03/24/23

## 2023-03-24 ENCOUNTER — Ambulatory Visit (INDEPENDENT_AMBULATORY_CARE_PROVIDER_SITE_OTHER): Payer: Medicare HMO | Admitting: Sports Medicine

## 2023-03-24 VITALS — BP 130/68 | HR 75 | Ht 61.0 in | Wt 193.0 lb

## 2023-03-24 DIAGNOSIS — G8929 Other chronic pain: Secondary | ICD-10-CM | POA: Diagnosis not present

## 2023-03-24 DIAGNOSIS — M546 Pain in thoracic spine: Secondary | ICD-10-CM | POA: Diagnosis not present

## 2023-03-26 ENCOUNTER — Ambulatory Visit (INDEPENDENT_AMBULATORY_CARE_PROVIDER_SITE_OTHER): Payer: Medicare HMO | Admitting: Pulmonary Disease

## 2023-03-26 ENCOUNTER — Encounter (HOSPITAL_BASED_OUTPATIENT_CLINIC_OR_DEPARTMENT_OTHER): Payer: Self-pay | Admitting: Pulmonary Disease

## 2023-03-26 VITALS — BP 112/60 | HR 78 | Temp 98.2°F | Ht 61.0 in | Wt 187.8 lb

## 2023-03-26 DIAGNOSIS — J4489 Other specified chronic obstructive pulmonary disease: Secondary | ICD-10-CM

## 2023-03-26 DIAGNOSIS — J9612 Chronic respiratory failure with hypercapnia: Secondary | ICD-10-CM | POA: Diagnosis not present

## 2023-03-26 DIAGNOSIS — J9611 Chronic respiratory failure with hypoxia: Secondary | ICD-10-CM | POA: Diagnosis not present

## 2023-03-26 NOTE — Assessment & Plan Note (Signed)
Continue Trelegy. She is steroid-dependent discussed side effects of steroids. Attempt to taper to 5 mg gradually Drop to 5 mg on Monday/Wednesday/Friday, 10 mg an hour days. If you do well on this dose for a month, then you can drop to 5 mg daily

## 2023-03-26 NOTE — Patient Instructions (Signed)
X NIV download  Attempt to decrease prednisone after you get back from the beach Drop to 5 mg on Monday/Wednesday/Friday, 10 mg an hour days. If you do well on this dose for a month, then you can drop to 5 mg daily

## 2023-03-26 NOTE — Assessment & Plan Note (Signed)
Continue oxygen during daytime, NIV at night. We will check download but she reports good compliance.  She does report that NIV is helping her breathe better and, has decreased hospitalizations and acute visits has helped with sleepiness Advised against medications with sedative side effects

## 2023-03-26 NOTE — Progress Notes (Signed)
   Subjective:    Patient ID: [name], female    DOB: [birth year], 67 y.o.   MRN: [id number]  HPI  67 yo smoker for FU of COPD, quit 11/2017 and mild tracheomalacia with chronic hypoxic resp failure on o2 since 2018   Recurrent exacerbations in 2022, requiring maintenance prednisone starting 06/2021 -hospitalized 1/18 for 2 days for COPD exacerbation treated with BiPAP, steroids and bronchodilators    PMH - CABG HFpEF  6-week follow-up visit. Her back spasms subsided with meloxicam and Flexeril.  She is off these medications now. She continues to report increased sleepiness in the daytime.  She is compliant with NIV and uses this all through the night.  She takes several naps in the daytime.  She does not get out much. She remains on 10 mg of prednisone.  She is planning a trip to the beach and does not want to attempt to taper  Significant tests/ events reviewed   CT angiogram chest 01/2020-emphysema, mild tracheomalacia   04/2019-pulmonary function test- FVC 1.05 (44% predicted), postbronchodilator ratio 47, postbronchodilator FEV1 0.47 (25% predicted), DLCO 54   Spirometry 02/2017 >> ratio of 59, FEV1 of 42% FVC of 56% ABG 01/2014 was 7.29/66/75/92%   08/2014 >> PSG neg for OSA , Desaturation as low as 85%  Review of Systems neg for any significant sore throat, dysphagia, itching, sneezing, nasal congestion or excess/ purulent secretions, fever, chills, sweats, unintended wt loss, pleuritic or exertional cp, hempoptysis, orthopnea pnd or change in chronic leg swelling. Also denies presyncope, palpitations, heartburn, abdominal pain, nausea, vomiting, diarrhea or change in bowel or urinary habits, dysuria,hematuria, rash, arthralgias, visual complaints, headache, numbness weakness or ataxia.     Objective:   Physical Exam  Gen. Pleasant, obese, in no distress ENT - no lesions, no post nasal drip Neck: No JVD, no thyromegaly, no carotid bruits Lungs: no use of accessory  muscles, no dullness to percussion, decreased without rales or rhonchi  Cardiovascular: Rhythm regular, heart sounds  normal, no murmurs or gallops, no peripheral edema Musculoskeletal: No deformities, no cyanosis or clubbing , no tremors       Assessment & Plan:

## 2023-03-31 ENCOUNTER — Other Ambulatory Visit: Payer: Self-pay | Admitting: Sports Medicine

## 2023-04-02 ENCOUNTER — Telehealth: Payer: Self-pay | Admitting: Pulmonary Disease

## 2023-04-02 ENCOUNTER — Telehealth: Payer: Self-pay | Admitting: Internal Medicine

## 2023-04-02 MED ORDER — FUROSEMIDE 20 MG PO TABS: 20.0000 mg | ORAL_TABLET | Freq: Every day | ORAL | 0 refills | Status: DC

## 2023-04-02 NOTE — Telephone Encounter (Signed)
Ok done - [facility] [place] [employer]

## 2023-04-02 NOTE — Telephone Encounter (Signed)
Patient called and said she is at [facility] and accidentally left her furosemide (LASIX) 20 MG tablet at home. She would like to know if a prescription can be sent to [facility] on [address] in [place]. Best callback is [phone].

## 2023-04-02 NOTE — Telephone Encounter (Signed)
Pls advise last rx by Dr. Lavena Bullion.Marland KitchenRaechel Chute

## 2023-04-02 NOTE — Telephone Encounter (Signed)
Pt is in [facility] & left her meds at home & needs a refill for furosemide (LASIX) 20 MG tablet  sent to her, hasn't had it in 3 days  [facility] [address]

## 2023-04-03 NOTE — Telephone Encounter (Signed)
Called pt no answer LMOM MD [facility]../lmb 

## 2023-04-04 NOTE — Telephone Encounter (Signed)
ATC X1 LVM for patient to call the office back 

## 2023-04-08 NOTE — Telephone Encounter (Signed)
Atc no answer left vmm. Will also sent a mychart message to see if pt is still in need of this medication.

## 2023-04-09 NOTE — Telephone Encounter (Signed)
Spoke with pt who confirmed that already had gotten more Lasix and she is ok. Nothing further needed at this time.

## 2023-04-11 ENCOUNTER — Other Ambulatory Visit: Payer: Self-pay

## 2023-04-11 ENCOUNTER — Other Ambulatory Visit: Payer: Self-pay | Admitting: Internal Medicine

## 2023-04-11 MED ORDER — ATORVASTATIN CALCIUM 40 MG PO TABS: 40.0000 mg | ORAL_TABLET | Freq: Every day | ORAL | 3 refills | Status: DC

## 2023-04-14 NOTE — Telephone Encounter (Signed)
Pt received medication, nfn

## 2023-04-15 DIAGNOSIS — J449 Chronic obstructive pulmonary disease, unspecified: Secondary | ICD-10-CM | POA: Diagnosis not present

## 2023-04-16 DIAGNOSIS — J449 Chronic obstructive pulmonary disease, unspecified: Secondary | ICD-10-CM | POA: Diagnosis not present

## 2023-04-17 ENCOUNTER — Other Ambulatory Visit (INDEPENDENT_AMBULATORY_CARE_PROVIDER_SITE_OTHER): Payer: Medicare HMO

## 2023-04-17 ENCOUNTER — Encounter: Payer: Self-pay | Admitting: Internal Medicine

## 2023-04-17 DIAGNOSIS — E1165 Type 2 diabetes mellitus with hyperglycemia: Secondary | ICD-10-CM

## 2023-04-17 DIAGNOSIS — E559 Vitamin D deficiency, unspecified: Secondary | ICD-10-CM

## 2023-04-17 LAB — LIPID PANEL
Cholesterol: 162 mg/dL (ref 0–200)
HDL: 48.2 mg/dL (ref >39.00)
NonHDL: 114.07
Total CHOL/HDL Ratio: 3
Triglycerides: 243 mg/dL — ABNORMAL HIGH (ref 0.0–149.0)
VLDL: 48.6 mg/dL — ABNORMAL HIGH (ref 0.0–40.0)

## 2023-04-17 LAB — HEPATIC FUNCTION PANEL
ALT: 26 U/L (ref 0–35)
AST: 15 U/L (ref 0–37)
Albumin: 4.1 g/dL (ref 3.5–5.2)
Alkaline Phosphatase: 79 U/L (ref 39–117)
Bilirubin, Direct: 0.1 mg/dL (ref 0.0–0.3)
Total Bilirubin: 0.5 mg/dL (ref 0.2–1.2)
Total Protein: 6.7 g/dL (ref 6.0–8.3)

## 2023-04-17 LAB — BASIC METABOLIC PANEL
BUN: 14 mg/dL (ref 6–23)
CO2: 41 mEq/L — ABNORMAL HIGH (ref 19–32)
Calcium: 9.9 mg/dL (ref 8.4–10.5)
Chloride: 96 mEq/L (ref 96–112)
Creatinine, Ser: 0.66 mg/dL (ref 0.40–1.20)
GFR: 90.79 mL/min (ref >60.00)
Glucose, Bld: 233 mg/dL — ABNORMAL HIGH (ref 70–99)
Potassium: 4.3 mEq/L (ref 3.5–5.1)
Sodium: 141 mEq/L (ref 135–145)

## 2023-04-17 LAB — LDL CHOLESTEROL, DIRECT: Direct LDL: 82 mg/dL

## 2023-04-17 LAB — HEMOGLOBIN A1C: Hgb A1c MFr Bld: 7.4 % — ABNORMAL HIGH (ref 4.6–6.5)

## 2023-04-17 NOTE — Progress Notes (Signed)
The test results show that your current treatment is OK, as the tests are stable.  Please continue the same plan.  There is no other need for change of treatment or further evaluation based on these results, at this time.  thanks 

## 2023-04-18 NOTE — Telephone Encounter (Signed)
Lab Results  Component Value Date   HGBA1C 7.4 (H) 04/17/2023   No need for repeat A1c as this was just done

## 2023-04-19 ENCOUNTER — Other Ambulatory Visit: Payer: Self-pay | Admitting: Sports Medicine

## 2023-04-23 ENCOUNTER — Other Ambulatory Visit: Payer: Medicare HMO

## 2023-04-29 ENCOUNTER — Ambulatory Visit
Admission: RE | Admit: 2023-04-29 | Discharge: 2023-04-29 | Disposition: A | Discharge status: Home / Self Care | Payer: Medicare HMO | Source: Ambulatory Visit | Attending: Internal Medicine | Admitting: Internal Medicine

## 2023-04-29 DIAGNOSIS — E349 Endocrine disorder, unspecified: Secondary | ICD-10-CM | POA: Diagnosis not present

## 2023-04-29 DIAGNOSIS — M8588 Other specified disorders of bone density and structure, other site: Secondary | ICD-10-CM | POA: Diagnosis not present

## 2023-04-29 DIAGNOSIS — N958 Other specified menopausal and perimenopausal disorders: Secondary | ICD-10-CM | POA: Diagnosis not present

## 2023-04-29 DIAGNOSIS — E2839 Other primary ovarian failure: Secondary | ICD-10-CM

## 2023-04-30 ENCOUNTER — Other Ambulatory Visit: Payer: Self-pay | Admitting: Internal Medicine

## 2023-04-30 DIAGNOSIS — J449 Chronic obstructive pulmonary disease, unspecified: Secondary | ICD-10-CM | POA: Diagnosis not present

## 2023-04-30 MED ORDER — ALENDRONATE SODIUM 70 MG PO TABS
70.0000 mg | ORAL_TABLET | ORAL | 3 refills | Status: DC
Start: 2023-04-30 — End: 2023-05-23

## 2023-05-04 ENCOUNTER — Other Ambulatory Visit: Payer: Self-pay | Admitting: Sports Medicine

## 2023-05-05 ENCOUNTER — Other Ambulatory Visit: Payer: Self-pay | Admitting: Primary Care

## 2023-05-05 ENCOUNTER — Other Ambulatory Visit: Payer: Self-pay | Admitting: Sports Medicine

## 2023-05-05 ENCOUNTER — Other Ambulatory Visit: Payer: Self-pay | Admitting: Cardiology

## 2023-05-12 ENCOUNTER — Encounter (HOSPITAL_BASED_OUTPATIENT_CLINIC_OR_DEPARTMENT_OTHER): Payer: Self-pay | Admitting: Pulmonary Disease

## 2023-05-14 MED ORDER — PREDNISONE 10 MG PO TABS: 10.0000 mg | ORAL_TABLET | Freq: Two times a day (BID) | ORAL | 1 refills | Status: DC | PRN

## 2023-05-15 ENCOUNTER — Encounter: Payer: Self-pay | Admitting: Primary Care

## 2023-05-15 ENCOUNTER — Ambulatory Visit (INDEPENDENT_AMBULATORY_CARE_PROVIDER_SITE_OTHER): Payer: Medicare HMO | Admitting: Primary Care

## 2023-05-15 VITALS — BP 142/62 | HR 63 | Temp 98.4°F | Ht 61.0 in | Wt 196.4 lb

## 2023-05-15 DIAGNOSIS — J4489 Other specified chronic obstructive pulmonary disease: Secondary | ICD-10-CM

## 2023-05-15 DIAGNOSIS — J9612 Chronic respiratory failure with hypercapnia: Secondary | ICD-10-CM

## 2023-05-15 DIAGNOSIS — J449 Chronic obstructive pulmonary disease, unspecified: Secondary | ICD-10-CM | POA: Diagnosis not present

## 2023-05-15 DIAGNOSIS — J9611 Chronic respiratory failure with hypoxia: Secondary | ICD-10-CM | POA: Diagnosis not present

## 2023-05-15 MED ORDER — BREZTRI AEROSPHERE 160-9-4.8 MCG/ACT IN AERO: 2.0000 | INHALATION_SPRAY | Freq: Two times a day (BID) | RESPIRATORY_TRACT | Status: DC

## 2023-05-15 NOTE — Progress Notes (Signed)
@Patient  ID: [name], female    DOB: 1956-05-24, 67 y.o.   MRN: [id number]  Chief Complaint  Patient presents with   Acute Visit    C/o felt like "could not breathe in' x 1 wk.,nebulizer helped slightly, wheezing, occass. cough    Referring provider: Corwin Levins, MD  HPI: 67 yo smoker for FU of COPD, quit 11/2017 and mild tracheomalacia with chronic hypoxic resp failure on o2 since 2018   Recurrent exacerbations in 2022, requiring maintenance prednisone starting 06/2021 -hospitalized 1/18 for 2 days for COPD exacerbation treated with BiPAP, steroids and bronchodilators    PMH - CABG HFpEF  6-week follow-up visit. Her back spasms subsided with meloxicam and Flexeril.  She is off these medications now. She continues to report increased sleepiness in the daytime.  She is compliant with NIV and uses this all through the night.  She takes several naps in the daytime.  She does not get out much. She remains on 10 mg of prednisone.  She is planning a trip to the beach and does not want to attempt to taper    05/15/2023- interim hx  Patient presents today for acute visit. Patient developed increased shortness of breath symptoms last Friday July 19th and Saturday July 20th.  She reported having difficulty taking full deep breath.  Associated wheezing and chest tightness. She is steroid dependent. She took 40mg  Prednisone for 2 days and symptoms improved. She is compliant with Trelegy 200 mcg daily.  She has been using her nebulizer twice daily. She has been unable to taper prednisone dose, she ran out of prescription.  Refill has been sent by Dr. Vassie Loll. No sick contacts or fever.   Trilogy download shows 100% compliance from June 23-July 23 Average usage 8.7 hours  Avg PIP 16.1 cm h20/ Avg PEEP 4.0cm h20  Avg min ventilation 8.4L/min Avg breaths per minute 23 bpm  Significant tests/ events reviewed   CT angiogram chest 01/2020-emphysema, mild tracheomalacia   04/2019-pulmonary  function test- FVC 1.05 (44% predicted), postbronchodilator ratio 47, postbronchodilator FEV1 0.47 (25% predicted), DLCO 54   Spirometry 02/2017 >> ratio of 59, FEV1 of 42% FVC of 56% ABG 01/2014 was 7.29/66/75/92%   08/2014 >> PSG neg for OSA , Desaturation as low as 85% Allergies  Allergen Reactions   Augmentin [Amoxicillin-Pot Clavulanate] Nausea And Vomiting   Chantix [Varenicline] Nausea And Vomiting    Immunization History  Administered Date(s) Administered   COVID-19, mRNA, vaccine(Comirnaty)12 years and older 10/24/2022   Fluad Quad(high Dose 65+) 07/10/2022   Influenza Split 07/08/2013   Influenza,inj,Quad PF,6+ Mos 07/31/2015, 09/03/2017, 10/04/2021   PFIZER Comirnaty(Gray Top)Covid-19 Tri-Sucrose Vaccine 08/31/2020   PFIZER(Purple Top)SARS-COV-2 Vaccination 01/01/2020, 01/25/2020   PNEUMOCOCCAL CONJUGATE-20 11/14/2022   Pneumococcal Polysaccharide-23 02/02/2014, 05/03/2019    Past Medical History:  Diagnosis Date   Bronchitis    CAD (coronary artery disease)    a. 02/2002: CABG x3 with LIMA to LAD, RIMA to RCA, and SVG to OM   CHF (congestive heart failure) (HCC)    COPD (chronic obstructive pulmonary disease) (HCC)    Diabetes (HCC)    Headache    Heart disease    Hyperlipidemia    Hypertension    Hypertensive emergency 07/01/2013   Impaired glucose tolerance 08/20/2014   Sinusitis     Tobacco History: Social History   Tobacco Use  Smoking Status Some Days   Current packs/day: 0.00   Average packs/day: 0.5 packs/day for 47.4 years (23.7 ttl pk-yrs)  Types: Cigarettes   Start date: 12/29/1971   Last attempt to quit: 05/22/2019   Years since quitting: 3.9  Smokeless Tobacco Never  Tobacco Comments   Smoking daily 1-2 day.  Stops for a while and then goes back.  08/29/2022 hfb   Ready to quit: Not Answered Counseling given: Not Answered Tobacco comments: Smoking daily 1-2 day.  Stops for a while and then goes back.  08/29/2022 hfb   Outpatient  Medications Prior to Visit  Medication Sig Dispense Refill   acetaminophen (TYLENOL) 500 MG tablet Take 1,000 mg by mouth 2 (two) times daily as needed for headache, fever, moderate pain or mild pain.     albuterol (PROVENTIL) (2.5 MG/3ML) 0.083% nebulizer solution Take 3 mLs (2.5 mg total) by nebulization every 6 (six) hours as needed for wheezing or shortness of breath. 75 mL 12   albuterol (VENTOLIN HFA) 108 (90 Base) MCG/ACT inhaler INHALE 1-2 PUFFS BY MOUTH EVERY 6 HOURS AS NEEDED FOR WHEEZE OR SHORTNESS OF BREATH 8.5 each 5   alendronate (FOSAMAX) 70 MG tablet Take 1 tablet (70 mg total) by mouth every 7 (seven) days. Take with a full glass of water on an empty stomach. 12 tablet 3   amLODipine (NORVASC) 5 MG tablet TAKE 1 TABLET (5 MG TOTAL) BY MOUTH DAILY. 30 tablet 0   atorvastatin (LIPITOR) 40 MG tablet Take 1 tablet (40 mg total) by mouth daily. 90 tablet 3   benzonatate (TESSALON) 100 MG capsule Take 1 capsule (100 mg total) by mouth every 8 (eight) hours. 21 capsule 0   bisoprolol (ZEBETA) 5 MG tablet Take 1 tablet (5 mg total) by mouth daily. 90 tablet 3   Cholecalciferol (VITAMIN D-3 PO) Take 1 capsule by mouth in the morning.     Cyanocobalamin (VITAMIN B-12 PO) Take 1 tablet by mouth in the morning.     cyclobenzaprine (FLEXERIL) 5 MG tablet Take 1 tablet (5 mg total) by mouth 2 (two) times daily as needed for muscle spasms. 30 tablet 0   fluticasone (FLONASE) 50 MCG/ACT nasal spray Place 2 sprays into both nostrils daily. (Patient taking differently: Place 2 sprays into both nostrils in the morning.) 16 g 6   ipratropium-albuterol (DUONEB) 0.5-2.5 (3) MG/3ML SOLN Take 3 mLs by nebulization every 6 (six) hours as needed (wheezing, shortness of breath).     isosorbide mononitrate (IMDUR) 60 MG 24 hr tablet Take 1.5 tablets (90 mg total) by mouth daily. (Patient taking differently: Take 90 mg by mouth at bedtime.) 135 tablet 3   MAGNESIUM OXIDE PO Take 1 tablet by mouth at bedtime.      meloxicam (MOBIC) 15 MG tablet Take 1 tablet (15 mg total) by mouth daily. 30 tablet 0   metFORMIN (GLUCOPHAGE-XR) 500 MG 24 hr tablet TAKE 1 TABLET BY MOUTH EVERY DAY WITH BREAKFAST 90 tablet 2   methocarbamol (ROBAXIN) 500 MG tablet Take 1 tablet (500 mg total) by mouth 2 (two) times daily. 20 tablet 0   Multiple Vitamins-Minerals (MULTIVITAMIN WOMEN 50+) TABS Take 1 tablet by mouth in the morning.     TRELEGY ELLIPTA 200-62.5-25 MCG/ACT AEPB INHALE 1 PUFF BY MOUTH EVERY DAY 60 each 4   azithromycin (ZITHROMAX) 250 MG tablet Take 1 tablet (250 mg total) by mouth daily. Take first 2 tablets together, then 1 every day until finished. (Patient not taking: Reported on 05/15/2023) 6 tablet 0   Dulaglutide (TRULICITY) 0.75 MG/0.5ML SOPN Inject 0.75 mg into the skin once a week. (Patient not  taking: Reported on 05/15/2023) 6 mL 0   furosemide (LASIX) 20 MG tablet Take 1 tablet (20 mg total) by mouth daily for 3 days. 3 tablet 0   oxyCODONE-acetaminophen (PERCOCET/ROXICET) 5-325 MG tablet Take 1 tablet by mouth every 6 (six) hours as needed for severe pain. (Patient not taking: Reported on 05/15/2023) 30 tablet 0   predniSONE (DELTASONE) 10 MG tablet Take 1-2 tablets (10-20 mg total) by mouth 2 (two) times daily as needed. (Patient not taking: Reported on 05/15/2023) 60 tablet 1   tirzepatide (MOUNJARO) 5 MG/0.5ML Pen Inject 5 mg into the skin once a week. (Patient not taking: Reported on 05/15/2023) 6 mL 11   Facility-Administered Medications Prior to Visit  Medication Dose Route Frequency Provider Last Rate Last Admin   nicotine (NICOTROL) 10 MG inhaler 1 continuous puffing  1 continuous puffing Inhalation PRN Oretha Milch, MD       regadenoson (LEXISCAN) injection SOLN 0.4 mg  0.4 mg Intravenous Once Pricilla Riffle, MD       technetium tetrofosmin (TC-MYOVIEW) injection 30 millicurie  30 millicurie Intravenous Once PRN Pricilla Riffle, MD          Review of Systems  Review of Systems   Physical  Exam  BP (!) 142/62 (BP Location: Right Arm, Cuff Size: Normal)   Pulse 63   Temp 98.4 F (36.9 C) (Temporal)   Ht 5\' 1"  (1.549 m)   Wt 196 lb 6.4 oz (89.1 kg)   SpO2 96%   BMI 37.11 kg/m  Physical Exam   Lab Results:  CBC    Component Value Date/Time   WBC 12.0 (H) 02/27/2023 0500   RBC 4.62 02/27/2023 0500   HGB 12.7 02/27/2023 0500   HGB 15.7 06/12/2014 1543   HCT 41.7 02/27/2023 0500   HCT 48.3 (H) 06/12/2014 1543   PLT 223 02/27/2023 0500   PLT 178 06/12/2014 1543   MCV 90.3 02/27/2023 0500   MCV 91 06/12/2014 1543   MCH 27.5 02/27/2023 0500   MCHC 30.5 02/27/2023 0500   RDW 13.6 02/27/2023 0500   RDW 14.8 (H) 06/12/2014 1543   LYMPHSABS 1.5 02/17/2023 1557   MONOABS 0.2 02/17/2023 1557   EOSABS 0.0 02/17/2023 1557   BASOSABS 0.0 02/17/2023 1557    BMET    Component Value Date/Time   NA 141 04/17/2023 1101   NA 142 06/12/2014 1543   K 4.3 04/17/2023 1101   K 3.6 06/12/2014 1543   CL 96 04/17/2023 1101   CL 105 06/12/2014 1543   CO2 41 (H) 04/17/2023 1101   CO2 29 06/12/2014 1543   GLUCOSE 233 (H) 04/17/2023 1101   GLUCOSE 113 (H) 06/12/2014 1543   BUN 14 04/17/2023 1101   BUN 12 06/12/2014 1543   CREATININE 0.66 04/17/2023 1101   CREATININE 0.62 06/12/2014 1543   CALCIUM 9.9 04/17/2023 1101   CALCIUM 8.9 06/12/2014 1543   GFRNONAA >60 02/27/2023 0500   GFRNONAA >60 06/12/2014 1543   GFRAA NOT CALCULATED 01/28/2020 0205   GFRAA >60 06/12/2014 1543    BNP    Component Value Date/Time   BNP 89.0 11/07/2022 1214    ProBNP    Component Value Date/Time   PROBNP 48.0 03/21/2021 1253    Imaging: DG Bone Density  Result Date: 04/29/2023 EXAM: DUAL X-RAY ABSORPTIOMETRY (DXA) FOR BONE MINERAL DENSITY IMPRESSION: Referring Physician:  Corwin Levins Your patient completed a bone mineral density test using GE Lunar iDXA system (analysis version: 16). Technologist:  lmn PATIENT: Name: Eria, Lozoya Patient ID: [id number] Birth Date: 08-Oct-1956  Height: 61.5 in. Sex: Female Measured: 04/29/2023 Weight: 190.4 lbs. Indications: COPD, Diabetic non insulin, Estrogen Deficient, High risk medication use, Postmenopausal, Prednisone, Secondary Osteoporosis, Glucocorticoids (Chronic) (255.41) Fractures: NONE Treatments: Multivitamin, Vitamin D (E933.5) ASSESSMENT: The BMD measured at Femur Neck Left is 0.806 g/cm2 with a T-score of -1.7. This patient is considered osteopenic/low bone mass according to World Health Organization [facility]) criteria. The quality of the exam is good. L1 and L2 were excluded due to degenerative changes. Site Region Measured Date Measured Age YA BMD Significant CHANGE T-score DualFemur Neck Left 04/29/2023 67.4 -1.7 0.806 g/cm2 AP Spine L3-L4 04/29/2023 67.4 -0.4 1.147 g/cm2 DualFemur Total Mean 4/29/2023 67.4 -1.3 0.844 g/cm2 Left Forearm Radius 33% 04[age]29/2023 67.4 -0.1 0.866 g/cm2 World Health Organization [facility]) criteria for post-menopausal, Caucasian Women: Normal       T-score at or above -1 SD Osteopenia   T-score between -1 and -2.5 SD Osteoporosis T-score at or below -2.5 SD RECOMMENDATION: 1. All patients should optimize calcium and vitamin D intake. 2. Consider FDA-approved medical therapies in postmenopausal women and men aged 76 years and older, based on the following: a. A hip or vertebral (clinical or morphometric) fracture. b. T-score = -2.5 at the femoral neck or spine after appropriate evaluation to exclude secondary causes. c. Low bone mass (T-score between -1.0 and -2.5 at the femoral neck or spine) and a 10-year probability of a hip fracture = 3% or a 10-year probability of a major osteoporosis-related fracture = 20% based on the [place]-adapted WHO algorithm. d. Clinician judgment and/or patient preferences may indicate treatment for people with 10-year fracture probabilities above or below these levels. FOLLOW-UP: Patients with diagnosis of osteoporosis or at high risk for fracture should have regular bone mineral density  tests.? Patients eligible for Medicare are allowed routine testing every 2 years.? The testing frequency can be increased to one year for patients who have rapidly progressing disease, are receiving or discontinuing medical therapy to restore bone mass, or have additional risk factors. I have reviewed this study and agree with the findings. [facility] FRAX* 10-year Probability of Fracture Based on femoral neck BMD: DualFemur (Left) Major Osteoporotic Fracture: 6.6% Hip Fracture:                1.0% Population:                  [place] ([place]) Risk Factors: Secondary Osteoporosis, Glucocorticoids (Chronic) (255.41) *FRAX is a Armed forces logistics/support/administrative officer of the [employer] of [employer] for Metabolic Bone Disease, a World Science writer (WHO) [employer]. ASSESSMENT: The probability of a major osteoporotic fracture is 6.6% within the next ten years. The probability of a hip fracture is 1.0% within the next ten years. Electronically Signed   By: Romona Curls M.D.   On: 04/29/2023 16:44     Assessment & Plan:   COPD (chronic obstructive pulmonary disease) with chronic bronchitis (HCC) - Poorly controlled, recent flare. Unable to taper prednisone - Trial Breztri Aerosphere two puffs twice daily - Advised patient stay on 10mg  prednisone daily  - Continue nebulizer every 6 hours as needed for any breakthrough shortness of breath  Follow-up: - 3 months with Dr. Vassie Loll (October) or sooner if needed   Chronic respiratory failure with hypoxia and hypercapnia (HCC) Trilogy download shows 100% compliance from June 23-July 23 Average usage 8.7 hours   Glenford Bayley, NP 05/15/2023

## 2023-05-15 NOTE — Assessment & Plan Note (Signed)
Trilogy download shows 100% compliance from June 23-July 23 Average usage 8.7 hours

## 2023-05-15 NOTE — Patient Instructions (Addendum)
Recommendations: - Stay on 10mg  prednisone daily - Start [employer] - take two puffs morning and evening (samples x 2 weeks, if prefer this to trelegy notify office and we can send in prescription) - Use nebulizer every 6 hours as needed for any breakthrough shortness of breath  Follow-up: - 3 months with Dr. Vassie Loll (October) or sooner if needed

## 2023-05-15 NOTE — Assessment & Plan Note (Signed)
-   Poorly controlled, recent flare. Unable to taper prednisone - Trial Breztri Aerosphere two puffs twice daily - Advised patient stay on 10mg  prednisone daily  - Continue nebulizer every 6 hours as needed for any breakthrough shortness of breath  Follow-up: - 3 months with Dr. Vassie Loll (October) or sooner if needed

## 2023-05-16 DIAGNOSIS — J449 Chronic obstructive pulmonary disease, unspecified: Secondary | ICD-10-CM | POA: Diagnosis not present

## 2023-05-23 ENCOUNTER — Ambulatory Visit: Payer: Medicare HMO | Admitting: Cardiology

## 2023-05-23 ENCOUNTER — Encounter: Payer: Self-pay | Admitting: Cardiology

## 2023-05-23 VITALS — BP 112/70 | HR 70 | Ht 61.0 in | Wt 195.2 lb

## 2023-05-23 DIAGNOSIS — I700 Atherosclerosis of aorta: Secondary | ICD-10-CM

## 2023-05-23 DIAGNOSIS — I25709 Atherosclerosis of coronary artery bypass graft(s), unspecified, with unspecified angina pectoris: Secondary | ICD-10-CM

## 2023-05-23 DIAGNOSIS — I10 Essential (primary) hypertension: Secondary | ICD-10-CM | POA: Diagnosis not present

## 2023-05-23 NOTE — Progress Notes (Signed)
  Cardiology Office Note:  .   Date:  05/23/2023  ID:  [name], DOB [birth year], MRN [id number] PCP: Corwin Levins, MD  [facility] Providers Cardiologist:  Donato Schultz, MD    History of Present Illness: .   [name] is a 67 y.o. female with coronary disease status post CABG, severe COPD followed by pulmonary with tracheomalacia chronic hypoxic respiratory failure on oxygen since 2018, quit tobacco in 2019 with recurrent exacerbations of COPD 2022, BiPAP steroids bronchodilators.    Overall been fairly stable.  Pulmonary notes reviewed.  Medications reviewed.  No chest pain.  Chronic shortness of breath.  ROS: No syncope. No bleeding.   Studies Reviewed: .        Echocardiogram 2023-EF 60% mild calcification aortic valve. Nuclear stress test 2022-low risk no ischemia  Risk Assessment/Calculations:            Physical Exam:   VS:  BP 112/70   Pulse 70   Ht 5\' 1"  (1.549 m)   Wt 195 lb 3.2 oz (88.5 kg)   SpO2 98%   BMI 36.88 kg/m    Wt Readings from Last 3 Encounters:  05/23/23 195 lb 3.2 oz (88.5 kg)  05/15/23 196 lb 6.4 oz (89.1 kg)  03/26/23 187 lb 12.8 oz (85.2 kg)    GEN: Well nourished, well developed in no acute distress NECK: No JVD; No carotid bruits, O2 CARDIAC: RRR, no murmurs, rubs, gallops RESPIRATORY:  + wheezing or rhonchi  ABDOMEN: Soft, non-tender, non-distended EXTREMITIES:  No edema; No deformity   ASSESSMENT AND PLAN: .    Coronary artery disease status post CABG with angina - On isosorbide 90 mg no changes, takes at night - Most recent stress test low risk with no ischemia in 2022 with normal EF.  Paroxysmal atrial tachycardia - On bisoprolol instead of carvedilol.  Previous EF normal. Doing well.  COPD - Followed closely by pulmonary.  Home O2. No changes  Tobacco use - Continue to encourage cessation.  Hyperlipidemia - Atorvastatin 40 mg. LDL 110          Dispo: 1 yr  Signed, Donato Schultz, MD

## 2023-05-23 NOTE — Patient Instructions (Signed)

## 2023-05-26 ENCOUNTER — Encounter (HOSPITAL_BASED_OUTPATIENT_CLINIC_OR_DEPARTMENT_OTHER): Payer: Self-pay | Admitting: Pulmonary Disease

## 2023-05-28 NOTE — Telephone Encounter (Signed)
Yes Trilogy compliance from [employer] was scanned into chart. I sent her a message now explaining this

## 2023-05-29 ENCOUNTER — Ambulatory Visit
Admission: RE | Admit: 2023-05-29 | Discharge: 2023-05-29 | Disposition: A | Discharge status: Home / Self Care | Payer: Medicare HMO | Source: Ambulatory Visit | Attending: Internal Medicine | Admitting: Internal Medicine

## 2023-05-29 DIAGNOSIS — Z1231 Encounter for screening mammogram for malignant neoplasm of breast: Secondary | ICD-10-CM

## 2023-05-30 ENCOUNTER — Ambulatory Visit: Payer: Medicare HMO | Admitting: Internal Medicine

## 2023-05-30 VITALS — BP 128/62 | HR 81 | Temp 98.8°F | Ht 61.0 in | Wt 191.4 lb

## 2023-05-30 DIAGNOSIS — E785 Hyperlipidemia, unspecified: Secondary | ICD-10-CM | POA: Diagnosis not present

## 2023-05-30 DIAGNOSIS — N6310 Unspecified lump in the right breast, unspecified quadrant: Secondary | ICD-10-CM | POA: Diagnosis not present

## 2023-05-30 DIAGNOSIS — T07XXXA Unspecified multiple injuries, initial encounter: Secondary | ICD-10-CM

## 2023-05-30 DIAGNOSIS — Z720 Tobacco use: Secondary | ICD-10-CM | POA: Diagnosis not present

## 2023-05-30 DIAGNOSIS — E559 Vitamin D deficiency, unspecified: Secondary | ICD-10-CM

## 2023-05-30 DIAGNOSIS — E1165 Type 2 diabetes mellitus with hyperglycemia: Secondary | ICD-10-CM

## 2023-05-30 DIAGNOSIS — Z7984 Long term (current) use of oral hypoglycemic drugs: Secondary | ICD-10-CM | POA: Diagnosis not present

## 2023-05-30 NOTE — Progress Notes (Unsigned)
Patient ID: [name], female   DOB: [birth year], 67 y.o.   MRN: [id number]        Chief Complaint: follow up right breast lump,  multiple bruises, dm, low vit d, hld, smoking       HPI:  [name] is a 67 y.o. female here with new finding of right breast lump in the past wk, nontender firm.  Also has noticed several new bruises to the arms,, a new thing for her, quite concerned about this new thing.  Pt denies chest pain, increased sob or doe, wheezing, orthopnea, PND, increased LE swelling, palpitations, dizziness or syncope.   Pt denies polydipsia, polyuria, or new focal neuro s/s.    Pt denies fever, wt loss, night sweats, loss of appetite, or other constitutional symptoms  still smoking, not ready to quit.         Wt Readings from Last 3 Encounters:  05/30/23 191 lb 6.4 oz (86.8 kg)  05/23/23 195 lb 3.2 oz (88.5 kg)  05/15/23 196 lb 6.4 oz (89.1 kg)   BP Readings from Last 3 Encounters:  05/30/23 128/62  05/23/23 112/70  05/15/23 (!) 142/62         Past Medical History:  Diagnosis Date   Bronchitis    CAD (coronary artery disease)    a. 02/2002: CABG x3 with LIMA to LAD, RIMA to RCA, and SVG to OM   CHF (congestive heart failure) (HCC)    COPD (chronic obstructive pulmonary disease) (HCC)    Diabetes (HCC)    Headache    Heart disease    Hyperlipidemia    Hypertension    Hypertensive emergency 07/01/2013   Impaired glucose tolerance 08/20/2014   Sinusitis    Past Surgical History:  Procedure Laterality Date   CORONARY ARTERY BYPASS GRAFT  2003   Triple bypass   ESOPHAGOGASTRODUODENOSCOPY Left 06/13/2014   Procedure: ESOPHAGOGASTRODUODENOSCOPY (EGD);  Surgeon: Charna Elizabeth, MD;  Location: [facility] ENDOSCOPY;  Service: Endoscopy;  Laterality: Left;   RIGID ESOPHAGOSCOPY N/A 06/12/2014   Procedure: RIGID ESOPHAGOSCOPY WITH FOREIGN BODY REMOVAL;  Surgeon: Darletta Moll, MD;  Location: [facility] OR;  Service: ENT;  Laterality: N/A;    reports that she has been smoking cigarettes.  She started smoking about 51 years ago. She has a 23.7 pack-year smoking history. She has never used smokeless tobacco. She reports current alcohol use of about 5.0 - 6.0 standard drinks of alcohol per week. She reports that she does not currently use drugs after having used the following drugs: Marijuana. family history includes Brain cancer in her mother; Cancer in her maternal grandmother and another family member; Heart disease in her mother; Lung cancer in her maternal aunt and paternal uncle; Stroke in an other family member; Suicidality in her brother. Allergies  Allergen Reactions   Augmentin [Amoxicillin-Pot Clavulanate] Nausea And Vomiting   Chantix [Varenicline] Nausea And Vomiting   Current Outpatient Medications on File Prior to Visit  Medication Sig Dispense Refill   acetaminophen (TYLENOL) 500 MG tablet Take 1,000 mg by mouth 2 (two) times daily as needed for headache, fever, moderate pain or mild pain.     albuterol (PROVENTIL) (2.5 MG/3ML) 0.083% nebulizer solution Take 3 mLs (2.5 mg total) by nebulization every 6 (six) hours as needed for wheezing or shortness of breath. 75 mL 12   albuterol (VENTOLIN HFA) 108 (90 Base) MCG/ACT inhaler INHALE 1-2 PUFFS BY MOUTH EVERY 6 HOURS AS NEEDED FOR WHEEZE OR SHORTNESS OF BREATH 8.5 each 5  amLODipine (NORVASC) 5 MG tablet TAKE 1 TABLET (5 MG TOTAL) BY MOUTH DAILY. 30 tablet 0   atorvastatin (LIPITOR) 40 MG tablet Take 1 tablet (40 mg total) by mouth daily. 90 tablet 3   benzonatate (TESSALON) 100 MG capsule Take 1 capsule (100 mg total) by mouth every 8 (eight) hours. 21 capsule 0   bisoprolol (ZEBETA) 5 MG tablet Take 1 tablet (5 mg total) by mouth daily. 90 tablet 3   Budeson-Glycopyrrol-Formoterol (BREZTRI AEROSPHERE) 160-9-4.8 MCG/ACT AERO Inhale 2 puffs into the lungs in the morning and at bedtime.     Cholecalciferol (VITAMIN D-3 PO) Take 1 capsule by mouth in the morning.     Cyanocobalamin (VITAMIN B-12 PO) Take 1 tablet by mouth  in the morning.     fluticasone (FLONASE) 50 MCG/ACT nasal spray Place 2 sprays into both nostrils daily. (Patient taking differently: Place 2 sprays into both nostrils in the morning.) 16 g 6   ipratropium-albuterol (DUONEB) 0.5-2.5 (3) MG/3ML SOLN Take 3 mLs by nebulization every 6 (six) hours as needed (wheezing, shortness of breath).     isosorbide mononitrate (IMDUR) 60 MG 24 hr tablet Take 1.5 tablets (90 mg total) by mouth daily. (Patient taking differently: Take 90 mg by mouth at bedtime.) 135 tablet 3   MAGNESIUM OXIDE PO Take 1 tablet by mouth at bedtime.     metFORMIN (GLUCOPHAGE-XR) 500 MG 24 hr tablet TAKE 1 TABLET BY MOUTH EVERY DAY WITH BREAKFAST 90 tablet 2   Multiple Vitamins-Minerals (MULTIVITAMIN WOMEN 50+) TABS Take 1 tablet by mouth in the morning.     predniSONE (DELTASONE) 10 MG tablet Take 1-2 tablets (10-20 mg total) by mouth 2 (two) times daily as needed. 60 tablet 1   tirzepatide (MOUNJARO) 5 MG/0.5ML Pen Inject 5 mg into the skin once a week. 6 mL 11   TRELEGY ELLIPTA 200-62.5-25 MCG/ACT AEPB INHALE 1 PUFF BY MOUTH EVERY DAY 60 each 4   furosemide (LASIX) 20 MG tablet Take 1 tablet (20 mg total) by mouth daily for 3 days. 3 tablet 0   Current Facility-Administered Medications on File Prior to Visit  Medication Dose Route Frequency Provider Last Rate Last Admin   nicotine (NICOTROL) 10 MG inhaler 1 continuous puffing  1 continuous puffing Inhalation PRN Oretha Milch, MD       regadenoson (LEXISCAN) injection SOLN 0.4 mg  0.4 mg Intravenous Once Pricilla Riffle, MD       technetium tetrofosmin (TC-MYOVIEW) injection 30 millicurie  30 millicurie Intravenous Once PRN Pricilla Riffle, MD            ROS:  All others reviewed and negative.  Objective        PE:  BP 128/62 (BP Location: Right Arm, Patient Position: Sitting, Cuff Size: Normal)   Pulse 81   Temp 98.8 F (37.1 C) (Oral)   Ht 5\' 1"  (1.549 m)   Wt 191 lb 6.4 oz (86.8 kg)   SpO2 97%   BMI 36.16 kg/m                  Constitutional: Pt appears in NAD               HENT: Head: NCAT.                Right Ear: External ear normal.                 Left Ear: External ear normal.  Eyes: . Pupils are equal, round, and reactive to light. Conjunctivae and EOM are normal               Nose: without d/c or deformity               Neck: Neck supple. Gross normal ROM               Cardiovascular: Normal rate and regular rhythm.                 Pulmonary/Chest: Effort normal and breath sounds without rales or wheezing.                Abd:  Soft, NT, ND, + BS, no organomegaly               Neurological: Pt is alert. At baseline orientation, motor grossly intact               Skin: Skin is warm. No rashes, no other new lesions, LE edema - none               Psychiatric: Pt behavior is normal without agitation   Micro: none  Cardiac tracings I have personally interpreted today:  none  Pertinent Radiological findings (summarize): none   Lab Results  Component Value Date   WBC 9.1 05/30/2023   HGB 13.3 05/30/2023   HCT 40.6 05/30/2023   PLT 239 05/30/2023   GLUCOSE 233 (H) 04/17/2023   CHOL 162 04/17/2023   TRIG 243.0 (H) 04/17/2023   HDL 48.20 04/17/2023   LDLDIRECT 82.0 04/17/2023   LDLCALC 110 (H) 02/17/2023   ALT 26 04/17/2023   AST 15 04/17/2023   NA 141 04/17/2023   K 4.3 04/17/2023   CL 96 04/17/2023   CREATININE 0.66 04/17/2023   BUN 14 04/17/2023   CO2 41 (H) 04/17/2023   TSH 0.60 02/17/2023   INR 1.0 05/30/2023   HGBA1C 7.4 (H) 04/17/2023   MICROALBUR 6.0 (H) 02/17/2023   Assessment/Plan:  [name] is a 67 y.o. Other or two or more races [6] female with  has a past medical history of Bronchitis, CAD (coronary artery disease), CHF (congestive heart failure) (HCC), COPD (chronic obstructive pulmonary disease) (HCC), Diabetes (HCC), Headache, Heart disease, Hyperlipidemia, Hypertension, Hypertensive emergency (07/01/2013), Impaired glucose tolerance (08/20/2014),  and Sinusitis.  Multiple bruises Likely benign senile purpura, but for cbc, inr with labs  Lump of right breast For diagnostic mammogram  Diabetes (HCC) Lab Results  Component Value Date   HGBA1C 7.4 (H) 04/17/2023   uncontrolled, pt to continue current medical treatment metfomrin ER 500 mg 1 every day, mounjaro 5 mg weekly, declines further change today    Vitamin D deficiency Last vitamin D Lab Results  Component Value Date   VD25OH 24.60 (L) 02/17/2023   Low, to start oral replacement   HLD (hyperlipidemia) Lab Results  Component Value Date   LDLCALC 110 (H) 02/17/2023   Uncontrolled, goal ldl < 70, pt to continue lipitor 40 mg with good compliance and low chol DM diet   Tobacco abuse Pt counsled to quit, pt not ready  Followup: Return if symptoms worsen or fail to improve.  Oliver Barre, MD 05/31/2023 8:48 PM [facility]

## 2023-05-30 NOTE — Patient Instructions (Signed)
Your order for the diagnostic mammogram is signed and to be faxed today  Please continue all other medications as before, and refills have been done if requested.  Please have the pharmacy call with any other refills you may need.  Please keep your appointments with your specialists as you may have planned  Please go to the LAB at the blood drawing area for the tests to be done - just the blood count with platelets and INR today  You will be contacted by phone if any changes need to be made immediately.  Otherwise, you will receive a letter about your results with an explanation, but please check with MyChart first.

## 2023-05-31 ENCOUNTER — Other Ambulatory Visit: Payer: Self-pay | Admitting: Cardiology

## 2023-05-31 ENCOUNTER — Encounter: Payer: Self-pay | Admitting: Internal Medicine

## 2023-05-31 DIAGNOSIS — N6310 Unspecified lump in the right breast, unspecified quadrant: Secondary | ICD-10-CM | POA: Insufficient documentation

## 2023-05-31 DIAGNOSIS — J449 Chronic obstructive pulmonary disease, unspecified: Secondary | ICD-10-CM | POA: Diagnosis not present

## 2023-05-31 DIAGNOSIS — T07XXXA Unspecified multiple injuries, initial encounter: Secondary | ICD-10-CM | POA: Insufficient documentation

## 2023-05-31 NOTE — Assessment & Plan Note (Signed)
Lab Results  Component Value Date   HGBA1C 7.4 (H) 04/17/2023   uncontrolled, pt to continue current medical treatment metfomrin ER 500 mg 1 every day, mounjaro 5 mg weekly, declines further change today

## 2023-05-31 NOTE — Assessment & Plan Note (Signed)
Pt counsled to quit, pt not ready °

## 2023-05-31 NOTE — Assessment & Plan Note (Signed)
Last vitamin D Lab Results  Component Value Date   VD25OH 24.60 (L) 02/17/2023   Low, to start oral replacement

## 2023-05-31 NOTE — Assessment & Plan Note (Signed)
Lab Results  Component Value Date   LDLCALC 110 (H) 02/17/2023   Uncontrolled, goal ldl < 70, pt to continue lipitor 40 mg with good compliance and low chol DM diet

## 2023-05-31 NOTE — Assessment & Plan Note (Signed)
Likely benign senile purpura, but for cbc, inr with labs

## 2023-05-31 NOTE — Assessment & Plan Note (Signed)
For diagnostic mammogram

## 2023-06-02 ENCOUNTER — Other Ambulatory Visit: Payer: Self-pay | Admitting: Internal Medicine

## 2023-06-02 DIAGNOSIS — N63 Unspecified lump in unspecified breast: Secondary | ICD-10-CM

## 2023-06-06 ENCOUNTER — Other Ambulatory Visit: Payer: Self-pay | Admitting: Sports Medicine

## 2023-06-15 DIAGNOSIS — J449 Chronic obstructive pulmonary disease, unspecified: Secondary | ICD-10-CM | POA: Diagnosis not present

## 2023-06-16 ENCOUNTER — Ambulatory Visit: Payer: Medicare HMO

## 2023-06-16 VITALS — Ht 61.0 in | Wt 190.0 lb

## 2023-06-16 DIAGNOSIS — Z0000 Encounter for general adult medical examination without abnormal findings: Secondary | ICD-10-CM | POA: Diagnosis not present

## 2023-06-16 DIAGNOSIS — J449 Chronic obstructive pulmonary disease, unspecified: Secondary | ICD-10-CM | POA: Diagnosis not present

## 2023-06-16 NOTE — Patient Instructions (Signed)
[name] , Thank you for taking time to come for your Medicare Wellness Visit. I appreciate your ongoing commitment to your health goals. Please review the following plan we discussed and let me know if I can assist you in the future.   Referrals/Orders/Follow-Ups/Clinician Recommendations: No; patient will speak with Dr. Jonny Ruiz at next visit regarding Lung Cancer Screening.  This is a list of the screening recommended for you and due dates:  Health Maintenance  Topic Date Due   DTaP/Tdap/Td vaccine (1 - Tdap) Never done   Zoster (Shingles) Vaccine (1 of 2) Never done   Screening for Lung Cancer  01/27/2021   COVID-19 Vaccine (5 - 2023-24 season) 02/22/2023   Eye exam for diabetics  03/02/2023   Flu Shot  01/19/2024*   Hemoglobin A1C  10/17/2023   Yearly kidney health urinalysis for diabetes  02/17/2024   Yearly kidney function blood test for diabetes  04/16/2024   Mammogram  05/27/2024   Complete foot exam   05/29/2024   Medicare Annual Wellness Visit  06/15/2024   Colon Cancer Screening  09/02/2024   Pneumonia Vaccine  Completed   DEXA scan (bone density measurement)  Completed   Hepatitis C Screening  Completed   HPV Vaccine  Aged Out  *Topic was postponed. The date shown is not the original due date.    Advanced directives: (Copy Requested) Please bring a copy of your health care power of attorney and living will to the office to be added to your chart at your convenience.  Next Medicare Annual Wellness Visit scheduled for next year: Yes  Preventive Care attachment FALL PREVENTION attachment  Lung Cancer Screening attachment

## 2023-06-16 NOTE — Progress Notes (Signed)
Subjective:   [name] is a 67 y.o. female who presents for Medicare Annual (Subsequent) preventive examination.  Visit Complete: Virtual  I connected with  [name] on 06/16/23 by a audio enabled telemedicine application and verified that I am speaking with the correct person using two identifiers.  Patient Location: Home  Provider Location: Office/Clinic  I discussed the limitations of evaluation and management by telemedicine. The patient expressed understanding and agreed to proceed.  Vital Signs: Because this visit was a virtual/telehealth visit, some criteria may be missing or patient reported. Any vitals not documented were not able to be obtained and vitals that have been documented are patient reported.    Review of Systems     Cardiac Risk Factors include: advanced age (>41men, >33 women);diabetes mellitus;dyslipidemia;family history of premature cardiovascular disease;hypertension;obesity (BMI >30kg/m2);sedentary lifestyle     Objective:    Today's Vitals   06/16/23 1117  Weight: 190 lb (86.2 kg)  Height: 5\' 1"  (1.549 m)  PainSc: 0-No pain   Body mass index is 35.9 kg/m.     06/16/2023   11:20 AM 02/27/2023    4:48 AM 11/07/2022    3:58 PM 06/20/2022    1:35 PM 01/28/2020    7:56 AM 01/26/2020    1:15 PM 06/26/2019    9:31 PM  Advanced Directives  Does Patient Have a Medical Advance Directive? Yes No No Yes Yes Yes No  Type of Estate agent of [place];Living will   Healthcare Power of [place];Living will Healthcare Power of [place];Living will;Out of facility DNR (pink MOST or yellow form) Healthcare Power of [place];Out of facility DNR (pink MOST or yellow form)   Does patient want to make changes to medical advance directive?     No - Patient declined No - Patient declined   Copy of Healthcare Power of Attorney in Chart? No - copy requested   No - copy requested No - copy requested No - copy requested, Physician notified    Would patient like information on creating a medical advance directive?  No - Patient declined No - Patient declined    No - Patient declined    Current Medications (verified) Outpatient Encounter Medications as of 06/16/2023  Medication Sig   acetaminophen (TYLENOL) 500 MG tablet Take 1,000 mg by mouth 2 (two) times daily as needed for headache, fever, moderate pain or mild pain.   albuterol (PROVENTIL) (2.5 MG/3ML) 0.083% nebulizer solution Take 3 mLs (2.5 mg total) by nebulization every 6 (six) hours as needed for wheezing or shortness of breath.   albuterol (VENTOLIN HFA) 108 (90 Base) MCG/ACT inhaler INHALE 1-2 PUFFS BY MOUTH EVERY 6 HOURS AS NEEDED FOR WHEEZE OR SHORTNESS OF BREATH   amLODipine (NORVASC) 5 MG tablet TAKE 1 TABLET (5 MG TOTAL) BY MOUTH DAILY.   atorvastatin (LIPITOR) 40 MG tablet Take 1 tablet (40 mg total) by mouth daily.   benzonatate (TESSALON) 100 MG capsule Take 1 capsule (100 mg total) by mouth every 8 (eight) hours.   bisoprolol (ZEBETA) 5 MG tablet Take 1 tablet (5 mg total) by mouth daily.   Budeson-Glycopyrrol-Formoterol (BREZTRI AEROSPHERE) 160-9-4.8 MCG/ACT AERO Inhale 2 puffs into the lungs in the morning and at bedtime.   Cholecalciferol (VITAMIN D-3 PO) Take 1 capsule by mouth in the morning.   Cyanocobalamin (VITAMIN B-12 PO) Take 1 tablet by mouth in the morning.   fluticasone (FLONASE) 50 MCG/ACT nasal spray Place 2 sprays into both nostrils daily. (Patient taking differently:  Place 2 sprays into both nostrils in the morning.)   furosemide (LASIX) 20 MG tablet Take 1 tablet (20 mg total) by mouth daily for 3 days.   ipratropium-albuterol (DUONEB) 0.5-2.5 (3) MG/3ML SOLN Take 3 mLs by nebulization every 6 (six) hours as needed (wheezing, shortness of breath).   isosorbide mononitrate (IMDUR) 60 MG 24 hr tablet Take 1.5 tablets (90 mg total) by mouth daily. (Patient taking differently: Take 90 mg by mouth at bedtime.)   MAGNESIUM OXIDE PO Take 1 tablet by  mouth at bedtime.   metFORMIN (GLUCOPHAGE-XR) 500 MG 24 hr tablet TAKE 1 TABLET BY MOUTH EVERY DAY WITH BREAKFAST   Multiple Vitamins-Minerals (MULTIVITAMIN WOMEN 50+) TABS Take 1 tablet by mouth in the morning.   predniSONE (DELTASONE) 10 MG tablet Take 1-2 tablets (10-20 mg total) by mouth 2 (two) times daily as needed.   tirzepatide [facility]) 5 MG/0.5ML Pen Inject 5 mg into the skin once a week.   TRELEGY ELLIPTA 200-62.5-25 MCG/ACT AEPB INHALE 1 PUFF BY MOUTH EVERY DAY   Facility-Administered Encounter Medications as of 06/16/2023  Medication   nicotine (NICOTROL) 10 MG inhaler 1 continuous puffing   regadenoson (LEXISCAN) injection SOLN 0.4 mg   technetium tetrofosmin (TC-MYOVIEW) injection 30 millicurie    Allergies (verified) Augmentin [amoxicillin-pot clavulanate] and Chantix [varenicline]   History: Past Medical History:  Diagnosis Date   Bronchitis    CAD (coronary artery disease)    a. 02/2002: CABG x3 with LIMA to LAD, RIMA to RCA, and SVG to OM   CHF (congestive heart failure) (HCC)    COPD (chronic obstructive pulmonary disease) (HCC)    Diabetes (HCC)    Headache    Heart disease    Hyperlipidemia    Hypertension    Hypertensive emergency 07/01/2013   Impaired glucose tolerance 08/20/2014   Sinusitis    Past Surgical History:  Procedure Laterality Date   CORONARY ARTERY BYPASS GRAFT  2003   Triple bypass   ESOPHAGOGASTRODUODENOSCOPY Left 06/13/2014   Procedure: ESOPHAGOGASTRODUODENOSCOPY (EGD);  Surgeon: Charna Elizabeth, MD;  Location: [facility] ENDOSCOPY;  Service: Endoscopy;  Laterality: Left;   RIGID ESOPHAGOSCOPY N/A 06/12/2014   Procedure: RIGID ESOPHAGOSCOPY WITH FOREIGN BODY REMOVAL;  Surgeon: Darletta Moll, MD;  Location: [facility] OR;  Service: ENT;  Laterality: N/A;   Family History  Problem Relation Age of Onset   Lung cancer Paternal Uncle    Heart disease Mother    Brain cancer Mother    Cancer Maternal Grandmother        colon   Stroke Other    Cancer Other         x 4 aunts   Lung cancer Maternal Aunt    Suicidality Brother    Social History   Socioeconomic History   Marital status: Single    Spouse name: Not on file   Number of children: Not on file   Years of education: Not on file   Highest education level: Bachelor's degree (e.g., BA, AB, BS)  Occupational History   Not on file  Tobacco Use   Smoking status: Some Days    Current packs/day: 0.00    Average packs/day: 0.5 packs/day for 47.4 years (23.7 ttl pk-yrs)    Types: Cigarettes    Start date: 12/29/1971    Last attempt to quit: 05/22/2019    Years since quitting: 4.0   Smokeless tobacco: Never   Tobacco comments:    Smoking daily 1-2 day.  Stops for a while and then  goes back.  08/29/2022 hfb  Substance and Sexual Activity   Alcohol use: Yes    Alcohol/week: 5.0 - 6.0 standard drinks of alcohol    Types: 5 - 6 Glasses of wine per week   Drug use: Not Currently    Types: Marijuana    Comment: as a teenager   Sexual activity: Not Currently    Birth control/protection: None  Other Topics Concern   Not on file  Social History Narrative   Not on file   Social Determinants of Health   Financial Resource Strain: Low Risk  (06/16/2023)   Overall Financial Resource Strain (CARDIA)    Difficulty of Paying Living Expenses: Not hard at all  Food Insecurity: No Food Insecurity (06/16/2023)   Hunger Vital Sign    Worried About Running Out of Food in the Last Year: Never true    Ran Out of Food in the Last Year: Never true  Transportation Needs: No Transportation Needs (06/16/2023)   PRAPARE - Administrator, Civil Service (Medical): No    Lack of Transportation (Non-Medical): No  Physical Activity: Inactive (06/16/2023)   Exercise Vital Sign    Days of Exercise per Week: 0 days    Minutes of Exercise per Session: 0 min  Stress: No Stress Concern Present (06/16/2023)   [employer] of Occupational Health - Occupational Stress Questionnaire    Feeling of Stress :  Not at all  Social Connections: Moderately Integrated (06/16/2023)   Social Connection and Isolation Panel [NHANES]    Frequency of Communication with Friends and Family: More than three times a week    Frequency of Social Gatherings with Friends and Family: More than three times a week    Attends Religious Services: More than 4 times per year    Active Member of [employer] or Organizations: Yes    Attends Engineer, structural: More than 4 times per year    Marital Status: Never married    Tobacco Counseling Ready to quit: Not Answered Counseling given: Not Answered Tobacco comments: Smoking daily 1-2 day.  Stops for a while and then goes back.  08/29/2022 hfb   Clinical Intake:  Pre-visit preparation completed: Yes  Pain : No/denies pain Pain Score: 0-No pain     BMI - recorded: 35.9 Nutritional Status: BMI > 30  Obese Nutritional Risks: None Diabetes: Yes CBG done?: No Did pt. bring in CBG monitor from home?: No  How often do you need to have someone help you when you read instructions, pamphlets, or other written materials from your doctor or pharmacy?: 1 - Never What is the last grade level you completed in school?: HSG  Interpreter Needed?: No  Information entered by :: Sasan Wilkie N. Braylon Grenda, LPN.   Activities of Daily Living    06/16/2023   11:22 AM 11/07/2022    9:06 PM  In your present state of health, do you have any difficulty performing the following activities:  Hearing? 0 0  Vision? 0 0  Difficulty concentrating or making decisions? 0 0  Walking or climbing stairs? 0 1  Dressing or bathing? 0 0  Doing errands, shopping? 0 1  Preparing Food and eating ? N   Using the Toilet? N   In the past six months, have you accidently leaked urine? N   Do you have problems with loss of bowel control? N   Managing your Medications? N   Managing your Finances? N   Housekeeping or managing your Housekeeping? N  Patient Care Team: Corwin Levins, MD as PCP -  General (Internal Medicine) Jake Bathe, MD as PCP - Cardiology (Cardiology) Sallye Lat, MD as Consulting Physician (Ophthalmology)  Indicate any recent Medical Services you may have received from other than [facility] providers in the past year (date may be approximate).     Assessment:   This is a routine wellness examination for Neelam.  Hearing/Vision screen Hearing Screening - Comments:: Patient denied any hearing difficulty.   No hearing aids.  Vision Screening - Comments:: Patient does wear corrective lenses/contacts.  Annual eye exam done by: Sallye Lat, MD.   Dietary issues and exercise activities discussed:     Goals Addressed             This Visit's Progress    To maintain my health.        Depression Screen    06/16/2023   11:21 AM 05/30/2023    3:53 PM 03/11/2023    1:59 PM 03/11/2023    1:52 PM 02/17/2023    2:30 PM 11/14/2022    1:39 PM 11/14/2022    1:18 PM  PHQ 2/9 Scores  PHQ - 2 Score 0 0 0 0 0 0 0  PHQ- 9 Score 0      0    Fall Risk    06/16/2023   11:21 AM 05/30/2023    3:53 PM 03/11/2023    1:59 PM 03/11/2023    1:52 PM 02/17/2023    2:29 PM  Fall Risk   Falls in the past year? 0 0 0 0 0  Number falls in past yr: 0 0 0 0 0  Injury with Fall? 0 0 0 0 0  Risk for fall due to : No Fall Risks No Fall Risks No Fall Risks No Fall Risks No Fall Risks  Follow up Falls prevention discussed Falls evaluation completed Falls evaluation completed Falls evaluation completed Falls evaluation completed    MEDICARE RISK AT HOME: Medicare Risk at Home Any stairs in or around the home?: No If so, are there any without handrails?: No Home free of loose throw rugs in walkways, pet beds, electrical cords, etc?: Yes Adequate lighting in your home to reduce risk of falls?: Yes Life alert?: No Use of a cane, walker or w/c?: No Grab bars in the bathroom?: Yes Shower chair or bench in shower?: Yes Elevated toilet seat or a handicapped toilet?:  No  TIMED UP AND GO:  Was the test performed?  No    Cognitive Function:        06/16/2023   11:21 AM 06/20/2022    1:51 PM  6CIT Screen  What Year? 0 points 0 points  What month? 0 points 0 points  What time? 0 points 0 points  Count back from 20 0 points 0 points  Months in reverse 0 points 0 points  Repeat phrase 0 points 0 points  Total Score 0 points 0 points    Immunizations Immunization History  Administered Date(s) Administered   COVID-19, mRNA, vaccine(Comirnaty)12 years and older 10/24/2022   Fluad Quad(high Dose 65+) 07/10/2022   Influenza Split 07/08/2013   Influenza,inj,Quad PF,6+ Mos 07/31/2015, 09/03/2017, 10/04/2021   PFIZER Comirnaty(Gray Top)Covid-19 Tri-Sucrose Vaccine 08/31/2020   PFIZER(Purple Top)SARS-COV-2 Vaccination 01/01/2020, 01/25/2020   PNEUMOCOCCAL CONJUGATE-20 11/14/2022   Pneumococcal Polysaccharide-23 02/02/2014, 05/03/2019    TDAP status: Due, Education has been provided regarding the importance of this vaccine. Advised may receive this vaccine at local pharmacy or Health Dept. Aware  to provide a copy of the vaccination record if obtained from local pharmacy or Health Dept. Verbalized acceptance and understanding.  Flu Vaccine status: Up to date  Pneumococcal vaccine status: Up to date  Covid-19 vaccine status: Completed vaccines  Qualifies for Shingles Vaccine? Yes   Zostavax completed No   Shingrix Completed?: No.    Education has been provided regarding the importance of this vaccine. Patient has been advised to call insurance company to determine out of pocket expense if they have not yet received this vaccine. Advised may also receive vaccine at local pharmacy or Health Dept. Verbalized acceptance and understanding.  Screening Tests Health Maintenance  Topic Date Due   DTaP/Tdap/Td (1 - Tdap) Never done   Zoster Vaccines- Shingrix (1 of 2) Never done   Lung Cancer Screening  01/27/2021   COVID-19 Vaccine (5 - 2023-24 season)  02/22/2023   OPHTHALMOLOGY EXAM  03/02/2023   INFLUENZA VACCINE  01/19/2024 (Originally 05/22/2023)   HEMOGLOBIN A1C  10/17/2023   Diabetic kidney evaluation - Urine ACR  02/17/2024   Diabetic kidney evaluation - eGFR measurement  04/16/2024   MAMMOGRAM  05/27/2024   FOOT EXAM  05/29/2024   Medicare Annual Wellness (AWV)  06/15/2024   Colonoscopy  09/02/2024   Pneumonia Vaccine 93+ Years old  Completed   DEXA SCAN  Completed   Hepatitis C Screening  Completed   HPV VACCINES  Aged Out    Health Maintenance  Health Maintenance Due  Topic Date Due   DTaP/Tdap/Td (1 - Tdap) Never done   Zoster Vaccines- Shingrix (1 of 2) Never done   Lung Cancer Screening  01/27/2021   COVID-19 Vaccine (5 - 2023-24 season) 02/22/2023   OPHTHALMOLOGY EXAM  03/02/2023    Colorectal cancer screening: Type of screening: Colonoscopy. Completed 09/02/2014. Repeat every 10 years  Mammogram status: Completed 05/27/2022. Repeat every year-scheduled for 06/18/2023  Bone Density status: Completed 04/29/2023. Results reflect: Bone density results: OSTEOPENIA. Repeat every 2-3 years.  Lung Cancer Screening: (Low Dose CT Chest recommended if Age 54-80 years, 20 pack-year currently smoking OR have quit w/in 15years.) does qualify.   Lung Cancer Screening Referral: declined; patient would like to discuss with pcp at next visit.  Additional Screening:  Hepatitis C Screening: does qualify; Completed 10/19/2015  Vision Screening: Recommended annual ophthalmology exams for early detection of glaucoma and other disorders of the eye. Is the patient up to date with their annual eye exam?  Yes  Who is the provider or what is the name of the office in which the patient attends annual eye exams? Sallye Lat, MD. If pt is not established with a provider, would they like to be referred to a provider to establish care? No .   Dental Screening: Recommended annual dental exams for proper oral hygiene  Diabetic Foot Exam:  Diabetic Foot Exam: Completed 05/30/2023  Community Resource Referral / Chronic Care Management: CRR required this visit?  No   CCM required this visit?  No     Plan:     I have personally reviewed and noted the following in the patient's chart:   Medical and social history Use of alcohol, tobacco or illicit drugs  Current medications and supplements including opioid prescriptions. Patient is not currently taking opioid prescriptions. Functional ability and status Nutritional status Physical activity Advanced directives List of other physicians Hospitalizations, surgeries, and ER visits in previous 12 months Vitals Screenings to include cognitive, depression, and falls Referrals and appointments  In addition, I have reviewed  and discussed with patient certain preventive protocols, quality metrics, and best practice recommendations. A written personalized care plan for preventive services as well as general preventive health recommendations were provided to patient.     Mickeal Needy, LPN   1/61/0960   After Visit Summary: (Mail) Due to this being a telephonic visit, the after visit summary with patients personalized plan was offered to patient via mail   Nurse Notes: Normal cognitive status assessed by direct observation via telephone conversation by this Nurse Health Advisor. No abnormalities found.

## 2023-06-18 ENCOUNTER — Ambulatory Visit
Admission: RE | Admit: 2023-06-18 | Discharge: 2023-06-18 | Disposition: A | Discharge status: Home / Self Care | Payer: Medicare HMO | Source: Ambulatory Visit | Attending: Internal Medicine | Admitting: Internal Medicine

## 2023-06-18 DIAGNOSIS — N644 Mastodynia: Secondary | ICD-10-CM | POA: Diagnosis not present

## 2023-06-18 DIAGNOSIS — N63 Unspecified lump in unspecified breast: Secondary | ICD-10-CM

## 2023-06-18 DIAGNOSIS — N6314 Unspecified lump in the right breast, lower inner quadrant: Secondary | ICD-10-CM | POA: Diagnosis not present

## 2023-07-01 DIAGNOSIS — J449 Chronic obstructive pulmonary disease, unspecified: Secondary | ICD-10-CM | POA: Diagnosis not present

## 2023-07-03 ENCOUNTER — Other Ambulatory Visit: Payer: Self-pay | Admitting: Primary Care

## 2023-07-05 ENCOUNTER — Other Ambulatory Visit: Payer: Self-pay | Admitting: Cardiology

## 2023-07-16 DIAGNOSIS — J449 Chronic obstructive pulmonary disease, unspecified: Secondary | ICD-10-CM | POA: Diagnosis not present

## 2023-07-17 DIAGNOSIS — J449 Chronic obstructive pulmonary disease, unspecified: Secondary | ICD-10-CM | POA: Diagnosis not present

## 2023-07-21 ENCOUNTER — Encounter: Payer: Self-pay | Admitting: Internal Medicine

## 2023-07-21 MED ORDER — TRULICITY 1.5 MG/0.5ML ~~LOC~~ SOAJ: 1.5000 mg | SUBCUTANEOUS | 3 refills | Status: DC

## 2023-08-03 ENCOUNTER — Encounter: Payer: Self-pay | Admitting: Internal Medicine

## 2023-08-04 NOTE — Telephone Encounter (Signed)
Chart reviewed - ok for reschedule to after Oct 22 2023

## 2023-08-07 ENCOUNTER — Other Ambulatory Visit: Payer: Self-pay

## 2023-08-07 ENCOUNTER — Encounter: Payer: Self-pay | Admitting: Internal Medicine

## 2023-08-07 MED ORDER — TRULICITY 1.5 MG/0.5ML ~~LOC~~ SOAJ: 1.5000 mg | SUBCUTANEOUS | 3 refills | Status: DC

## 2023-08-09 ENCOUNTER — Other Ambulatory Visit: Payer: Self-pay | Admitting: Sports Medicine

## 2023-08-10 ENCOUNTER — Other Ambulatory Visit: Payer: Self-pay | Admitting: Internal Medicine

## 2023-08-11 ENCOUNTER — Other Ambulatory Visit: Payer: Self-pay

## 2023-08-15 DIAGNOSIS — J449 Chronic obstructive pulmonary disease, unspecified: Secondary | ICD-10-CM | POA: Diagnosis not present

## 2023-08-16 DIAGNOSIS — J449 Chronic obstructive pulmonary disease, unspecified: Secondary | ICD-10-CM | POA: Diagnosis not present

## 2023-08-18 ENCOUNTER — Ambulatory Visit: Payer: Medicare HMO | Admitting: Internal Medicine

## 2023-08-29 ENCOUNTER — Encounter: Payer: Self-pay | Admitting: Internal Medicine

## 2023-08-29 ENCOUNTER — Ambulatory Visit (INDEPENDENT_AMBULATORY_CARE_PROVIDER_SITE_OTHER): Payer: Medicare HMO | Admitting: Internal Medicine

## 2023-08-29 VITALS — BP 118/60 | HR 65 | Temp 98.1°F | Ht 61.0 in | Wt 195.0 lb

## 2023-08-29 DIAGNOSIS — I10 Essential (primary) hypertension: Secondary | ICD-10-CM

## 2023-08-29 DIAGNOSIS — E559 Vitamin D deficiency, unspecified: Secondary | ICD-10-CM

## 2023-08-29 DIAGNOSIS — J9611 Chronic respiratory failure with hypoxia: Secondary | ICD-10-CM | POA: Diagnosis not present

## 2023-08-29 DIAGNOSIS — Z7985 Long-term (current) use of injectable non-insulin antidiabetic drugs: Secondary | ICD-10-CM | POA: Diagnosis not present

## 2023-08-29 DIAGNOSIS — Z7984 Long term (current) use of oral hypoglycemic drugs: Secondary | ICD-10-CM | POA: Diagnosis not present

## 2023-08-29 DIAGNOSIS — Z720 Tobacco use: Secondary | ICD-10-CM

## 2023-08-29 DIAGNOSIS — J9612 Chronic respiratory failure with hypercapnia: Secondary | ICD-10-CM

## 2023-08-29 DIAGNOSIS — E785 Hyperlipidemia, unspecified: Secondary | ICD-10-CM

## 2023-08-29 DIAGNOSIS — J309 Allergic rhinitis, unspecified: Secondary | ICD-10-CM | POA: Diagnosis not present

## 2023-08-29 DIAGNOSIS — E1165 Type 2 diabetes mellitus with hyperglycemia: Secondary | ICD-10-CM | POA: Diagnosis not present

## 2023-08-29 DIAGNOSIS — J4489 Other specified chronic obstructive pulmonary disease: Secondary | ICD-10-CM

## 2023-08-29 DIAGNOSIS — Z23 Encounter for immunization: Secondary | ICD-10-CM | POA: Diagnosis not present

## 2023-08-29 DIAGNOSIS — H6993 Unspecified Eustachian tube disorder, bilateral: Secondary | ICD-10-CM

## 2023-08-29 LAB — LIPID PANEL
Cholesterol: 160 mg/dL (ref 0–200)
HDL: 43.3 mg/dL (ref >39.00)
LDL Cholesterol: 73 mg/dL (ref 0–99)
NonHDL: 116.51
Total CHOL/HDL Ratio: 4
Triglycerides: 218 mg/dL — ABNORMAL HIGH (ref 0.0–149.0)
VLDL: 43.6 mg/dL — ABNORMAL HIGH (ref 0.0–40.0)

## 2023-08-29 LAB — BASIC METABOLIC PANEL
BUN: 13 mg/dL (ref 6–23)
CO2: 38 meq/L — ABNORMAL HIGH (ref 19–32)
Calcium: 9.5 mg/dL (ref 8.4–10.5)
Chloride: 97 meq/L (ref 96–112)
Creatinine, Ser: 0.56 mg/dL (ref 0.40–1.20)
GFR: 94.22 mL/min (ref >60.00)
Glucose, Bld: 96 mg/dL (ref 70–99)
Potassium: 3.8 meq/L (ref 3.5–5.1)
Sodium: 141 meq/L (ref 135–145)

## 2023-08-29 LAB — HEPATIC FUNCTION PANEL
ALT: 20 U/L (ref 0–35)
AST: 15 U/L (ref 0–37)
Albumin: 4.3 g/dL (ref 3.5–5.2)
Alkaline Phosphatase: 81 U/L (ref 39–117)
Bilirubin, Direct: 0.1 mg/dL (ref 0.0–0.3)
Total Bilirubin: 0.4 mg/dL (ref 0.2–1.2)
Total Protein: 6.9 g/dL (ref 6.0–8.3)

## 2023-08-29 LAB — HEMOGLOBIN A1C: Hgb A1c MFr Bld: 7.7 % — ABNORMAL HIGH (ref 4.6–6.5)

## 2023-08-29 LAB — VITAMIN D 25 HYDROXY (VIT D DEFICIENCY, FRACTURES): VITD: 53.18 ng/mL (ref 30.00–100.00)

## 2023-08-29 NOTE — Patient Instructions (Addendum)
Please continue all other medications as before, and refills have been done if requested.  Please have the pharmacy call with any other refills you may need.  Please continue your efforts at being more active, low cholesterol diet, and weight control.  Please keep your appointments with your specialists as you may have planned  Please make an Appointment to return in 6 months, or sooner if needed 

## 2023-08-29 NOTE — Progress Notes (Unsigned)
Patient ID: [name], female   DOB: 1956-09-15, 67 y.o.   MRN: [id number]        Chief Complaint: follow up HTN, HLD and hyperglycemia ***       HPI:  [name] is a 67 y.o. female here with c/o         For flu shot today Wt Readings from Last 3 Encounters:  08/29/23 195 lb (88.5 kg)  06/16/23 190 lb (86.2 kg)  05/30/23 191 lb 6.4 oz (86.8 kg)   BP Readings from Last 3 Encounters:  08/29/23 118/60  05/30/23 128/62  05/23/23 112/70         Past Medical History:  Diagnosis Date   Bronchitis    CAD (coronary artery disease)    a. 02/2002: CABG x3 with LIMA to LAD, RIMA to RCA, and SVG to OM   CHF (congestive heart failure) (HCC)    COPD (chronic obstructive pulmonary disease) (HCC)    Diabetes (HCC)    Headache    Heart disease    Hyperlipidemia    Hypertension    Hypertensive emergency 07/01/2013   Impaired glucose tolerance 08/20/2014   Sinusitis    Past Surgical History:  Procedure Laterality Date   CORONARY ARTERY BYPASS GRAFT  2003   Triple bypass   ESOPHAGOGASTRODUODENOSCOPY Left 06/13/2014   Procedure: ESOPHAGOGASTRODUODENOSCOPY (EGD);  Surgeon: Charna Elizabeth, MD;  Location: [facility] ENDOSCOPY;  Service: Endoscopy;  Laterality: Left;   RIGID ESOPHAGOSCOPY N/A 06/12/2014   Procedure: RIGID ESOPHAGOSCOPY WITH FOREIGN BODY REMOVAL;  Surgeon: Darletta Moll, MD;  Location: [facility] OR;  Service: ENT;  Laterality: N/A;    reports that she has been smoking cigarettes. She started smoking about 51 years ago. She has a 23.7 pack-year smoking history. She has never used smokeless tobacco. She reports current alcohol use of about 5.0 - 6.0 standard drinks of alcohol per week. She reports that she does not currently use drugs after having used the following drugs: Marijuana. family history includes Brain cancer in her mother; Cancer in her maternal grandmother and another family member; Heart disease in her mother; Lung cancer in her maternal aunt and paternal uncle; Stroke in an other  family member; Suicidality in her brother. Allergies  Allergen Reactions   Augmentin [Amoxicillin-Pot Clavulanate] Nausea And Vomiting   Chantix [Varenicline] Nausea And Vomiting   Current Outpatient Medications on File Prior to Visit  Medication Sig Dispense Refill   acetaminophen (TYLENOL) 500 MG tablet Take 1,000 mg by mouth 2 (two) times daily as needed for headache, fever, moderate pain or mild pain.     albuterol (PROVENTIL) (2.5 MG/3ML) 0.083% nebulizer solution Take 3 mLs (2.5 mg total) by nebulization every 6 (six) hours as needed for wheezing or shortness of breath. 75 mL 12   albuterol (VENTOLIN HFA) 108 (90 Base) MCG/ACT inhaler INHALE 1-2 PUFFS BY MOUTH EVERY 6 HOURS AS NEEDED FOR WHEEZE OR SHORTNESS OF BREATH 8.5 each 5   amLODipine (NORVASC) 5 MG tablet TAKE 1 TABLET (5 MG TOTAL) BY MOUTH DAILY. 90 tablet 3   atorvastatin (LIPITOR) 40 MG tablet Take 1 tablet (40 mg total) by mouth daily. 90 tablet 3   benzonatate (TESSALON) 100 MG capsule Take 1 capsule (100 mg total) by mouth every 8 (eight) hours. 21 capsule 0   bisoprolol (ZEBETA) 5 MG tablet Take 1 tablet (5 mg total) by mouth daily. 90 tablet 3   Budeson-Glycopyrrol-Formoterol (BREZTRI AEROSPHERE) 160-9-4.8 MCG/ACT AERO Inhale 2 puffs into the lungs in  the morning and at bedtime.     Cholecalciferol (VITAMIN D-3 PO) Take 1 capsule by mouth in the morning.     Cyanocobalamin (VITAMIN B-12 PO) Take 1 tablet by mouth in the morning.     Dulaglutide (TRULICITY) 1.5 MG/0.5ML SOPN Inject 1.5 mg into the skin once a week. 6 mL 3   fluticasone (FLONASE) 50 MCG/ACT nasal spray Place 2 sprays into both nostrils daily. (Patient taking differently: Place 2 sprays into both nostrils in the morning.) 16 g 6   Fluticasone-Umeclidin-Vilant (TRELEGY ELLIPTA) 200-62.5-25 MCG/ACT AEPB INHALE 1 PUFF BY MOUTH EVERY DAY 60 each 11   ipratropium-albuterol (DUONEB) 0.5-2.5 (3) MG/3ML SOLN Take 3 mLs by nebulization every 6 (six) hours as needed  (wheezing, shortness of breath).     isosorbide mononitrate (IMDUR) 60 MG 24 hr tablet TAKE 1.5 TABLETS (90 MG TOTAL) BY MOUTH DAILY. 135 tablet 3   MAGNESIUM OXIDE PO Take 1 tablet by mouth at bedtime.     metFORMIN (GLUCOPHAGE-XR) 500 MG 24 hr tablet TAKE 1 TABLET BY MOUTH EVERY DAY WITH BREAKFAST 90 tablet 3   Multiple Vitamins-Minerals (MULTIVITAMIN WOMEN 50+) TABS Take 1 tablet by mouth in the morning.     predniSONE (DELTASONE) 10 MG tablet Take 1-2 tablets (10-20 mg total) by mouth 2 (two) times daily as needed. 60 tablet 1   furosemide (LASIX) 20 MG tablet Take 1 tablet (20 mg total) by mouth daily for 3 days. 3 tablet 0   Current Facility-Administered Medications on File Prior to Visit  Medication Dose Route Frequency Provider Last Rate Last Admin   nicotine (NICOTROL) 10 MG inhaler 1 continuous puffing  1 continuous puffing Inhalation PRN Oretha Milch, MD       regadenoson (LEXISCAN) injection SOLN 0.4 mg  0.4 mg Intravenous Once Pricilla Riffle, MD       technetium tetrofosmin (TC-MYOVIEW) injection 30 millicurie  30 millicurie Intravenous Once PRN Pricilla Riffle, MD            ROS:  All others reviewed and negative.  Objective        PE:  BP 118/60 (BP Location: Right Arm, Patient Position: Sitting, Cuff Size: Normal)   Pulse 65   Temp 98.1 F (36.7 C) (Oral)   Ht 5\' 1"  (1.549 m)   Wt 195 lb (88.5 kg)   SpO2 100%   BMI 36.84 kg/m                 Constitutional: Pt appears in NAD               HENT: Head: NCAT.                Right Ear: External ear normal.                 Left Ear: External ear normal.                Eyes: . Pupils are equal, round, and reactive to light. Conjunctivae and EOM are normal               Nose: without d/c or deformity               Neck: Neck supple. Gross normal ROM               Cardiovascular: Normal rate and regular rhythm.                 Pulmonary/Chest: Effort normal and  breath sounds without rales or wheezing.                Abd:   Soft, NT, ND, + BS, no organomegaly               Neurological: Pt is alert. At baseline orientation, motor grossly intact               Skin: Skin is warm. No rashes, no other new lesions, LE edema - ***               Psychiatric: Pt behavior is normal without agitation   Micro: none  Cardiac tracings I have personally interpreted today:  none  Pertinent Radiological findings (summarize): none   Lab Results  Component Value Date   WBC 9.1 05/30/2023   HGB 13.3 05/30/2023   HCT 40.6 05/30/2023   PLT 239 05/30/2023   GLUCOSE 233 (H) 04/17/2023   CHOL 162 04/17/2023   TRIG 243.0 (H) 04/17/2023   HDL 48.20 04/17/2023   LDLDIRECT 82.0 04/17/2023   LDLCALC 110 (H) 02/17/2023   ALT 26 04/17/2023   AST 15 04/17/2023   NA 141 04/17/2023   K 4.3 04/17/2023   CL 96 04/17/2023   CREATININE 0.66 04/17/2023   BUN 14 04/17/2023   CO2 41 (H) 04/17/2023   TSH 0.60 02/17/2023   INR 1.0 05/30/2023   HGBA1C 7.4 (H) 04/17/2023   MICROALBUR 6.0 (H) 02/17/2023   Assessment/Plan:  [name] is a 67 y.o. Other or two or more races [6] female with  has a past medical history of Bronchitis, CAD (coronary artery disease), CHF (congestive heart failure) (HCC), COPD (chronic obstructive pulmonary disease) (HCC), Diabetes (HCC), Headache, Heart disease, Hyperlipidemia, Hypertension, Hypertensive emergency (07/01/2013), Impaired glucose tolerance (08/20/2014), and Sinusitis.  No problem-specific Assessment & Plan notes found for this encounter.  Followup: No follow-ups on file.  Oliver Barre, MD 08/29/2023 11:28 AM [facility] Internal Medicine

## 2023-08-31 ENCOUNTER — Encounter: Payer: Self-pay | Admitting: Internal Medicine

## 2023-08-31 ENCOUNTER — Other Ambulatory Visit: Payer: Self-pay | Admitting: Internal Medicine

## 2023-08-31 MED ORDER — METFORMIN HCL ER 500 MG PO TB24: ORAL_TABLET | ORAL | 3 refills | Status: DC

## 2023-08-31 NOTE — Assessment & Plan Note (Signed)
Also for mucinex bid prn

## 2023-08-31 NOTE — Assessment & Plan Note (Signed)
but with HA with exertion, I suspect related to exertional hypoxia - pt on 3L home o2 at rest - ok for increase to 4 L with exertion,   to f/u any worsening symptoms or concerns

## 2023-08-31 NOTE — Assessment & Plan Note (Signed)
Last vitamin D Lab Results  Component Value Date   VD25OH 53.18 08/29/2023   Stable, cont oral replacement

## 2023-08-31 NOTE — Assessment & Plan Note (Signed)
Pt counsled to quit, pt not ready to quit

## 2023-08-31 NOTE — Assessment & Plan Note (Addendum)
Overall stable today, cont inhaler

## 2023-08-31 NOTE — Assessment & Plan Note (Signed)
Lab Results  Component Value Date   LDLCALC 73 08/29/2023   uncontrolled, pt to continue current statin lipitor 40 mg every day, declines change today except for improved DM low chol diet

## 2023-08-31 NOTE — Assessment & Plan Note (Signed)
BP Readings from Last 3 Encounters:  08/29/23 118/60  05/30/23 128/62  05/23/23 112/70   Stable, pt to continue medical treatment norvasc 5 every day, zebeta 5 qd

## 2023-08-31 NOTE — Assessment & Plan Note (Signed)
Also for nasacort asd qd

## 2023-08-31 NOTE — Assessment & Plan Note (Addendum)
Lab Results  Component Value Date   HGBA1C 7.7 (H) 08/29/2023   uncontrolled, pt to continue current medical treatment trulicity 1.5 mg weekly, but increased metformin ER 500 mg to 2 every day

## 2023-09-05 ENCOUNTER — Other Ambulatory Visit: Payer: Self-pay | Admitting: Cardiology

## 2023-09-05 ENCOUNTER — Other Ambulatory Visit: Payer: Self-pay | Admitting: Sports Medicine

## 2023-09-15 DIAGNOSIS — J449 Chronic obstructive pulmonary disease, unspecified: Secondary | ICD-10-CM | POA: Diagnosis not present

## 2023-09-16 DIAGNOSIS — J449 Chronic obstructive pulmonary disease, unspecified: Secondary | ICD-10-CM | POA: Diagnosis not present

## 2023-09-18 ENCOUNTER — Other Ambulatory Visit: Payer: Self-pay | Admitting: Pulmonary Disease

## 2023-09-25 ENCOUNTER — Other Ambulatory Visit: Payer: Self-pay | Admitting: Pulmonary Disease

## 2023-09-25 ENCOUNTER — Other Ambulatory Visit: Payer: Self-pay | Admitting: Internal Medicine

## 2023-09-26 NOTE — Telephone Encounter (Signed)
It was not stated in the patient's last office visit note if she was to continue the albuterol treatment. Patient has an appointment with you on 10-08-2023. Do you want to approve this refill or wait until the appointment date?

## 2023-10-08 ENCOUNTER — Encounter (HOSPITAL_BASED_OUTPATIENT_CLINIC_OR_DEPARTMENT_OTHER): Payer: Self-pay | Admitting: Pulmonary Disease

## 2023-10-08 ENCOUNTER — Ambulatory Visit (HOSPITAL_BASED_OUTPATIENT_CLINIC_OR_DEPARTMENT_OTHER): Payer: Medicare HMO | Admitting: Pulmonary Disease

## 2023-10-08 VITALS — BP 112/60 | HR 71 | Resp 16 | Ht 61.0 in | Wt 194.4 lb

## 2023-10-08 DIAGNOSIS — J9612 Chronic respiratory failure with hypercapnia: Secondary | ICD-10-CM

## 2023-10-08 DIAGNOSIS — J449 Chronic obstructive pulmonary disease, unspecified: Secondary | ICD-10-CM

## 2023-10-08 DIAGNOSIS — J441 Chronic obstructive pulmonary disease with (acute) exacerbation: Secondary | ICD-10-CM

## 2023-10-08 DIAGNOSIS — J9611 Chronic respiratory failure with hypoxia: Secondary | ICD-10-CM | POA: Diagnosis not present

## 2023-10-08 NOTE — Progress Notes (Signed)
Subjective:    Patient ID: [name], female    DOB: [birth year], 67 y.o.   MRN: [id number]  HPI 67 yo smoker for FU of COPD, quit 11/2017 and mild tracheomalacia with chronic hypoxic resp failure on o2 since 2018   Recurrent exacerbations in 2022, requiring maintenance prednisone starting 06/2021 -hospitalized 1/18 for 2 days for COPD exacerbation treated with BiPAP, steroids and bronchodilators    PMH - CABG HFpEF   Discussed the use of AI scribe software for clinical note transcription with the patient, who gave verbal consent to proceed.  History of Present Illness   The patient, with a history of COPD, presents for a routine check-up. She reports ongoing fatigue and lack of energy, which has been affecting her daily activities. Despite sleeping for at least ten hours a day, she feels exhausted and often prefers to stay in bed. She also reports fluid retention, particularly noticeable in her hands, which are often puffy and sometimes difficult to make a fist with in the morning. The patient has been taking Lasix for this issue, which helps to some extent.  The patient also reports weight gain, which she attributes to her medication, prednisone, rather than increased food intake or decreased physical activity. In fact, she has been regularly attending the gym, three to five days a week, since September. However, she has not noticed any weight loss. The patient expresses concern about her appearance due to the weight gain.  The patient also mentions that she has been using a machine at night to manage her COPD and that her carbon dioxide levels have been high. She has been taking prednisone to manage her COPD symptoms, but she feels that the current dosage is not sufficient, and she often feels better when she increases the dosage. However, she is aware that the prednisone is causing side effects, including weight gain.     Labs reviewed last bicarbonate 38 She takes Trulicity  injections   Significant tests/ events reviewed   CT angiogram chest 01/2020-emphysema, mild tracheomalacia   04/2019-pulmonary function test- FVC 1.05 (44% predicted), postbronchodilator ratio 47, postbronchodilator FEV1 0.47 (25% predicted), DLCO 54   Spirometry 02/2017 >> ratio of 59, FEV1 of 42% FVC of 56% ABG 01/2014 was 7.29/66/75/92%   08/2014 >> PSG neg for OSA , Desaturation as low as 85%  05/2023 AEC 319   Review of Systems neg for any significant sore throat, dysphagia, itching, sneezing, nasal congestion or excess/ purulent secretions, fever, chills, sweats, unintended wt loss, pleuritic or exertional cp, hempoptysis, orthopnea pnd or change in chronic leg swelling. Also denies presyncope, palpitations, heartburn, abdominal pain, nausea, vomiting, diarrhea or change in bowel or urinary habits, dysuria,hematuria, rash, arthralgias, visual complaints, headache, numbness weakness or ataxia.     Objective:   Physical Exam  Gen. Pleasant, obese, in no distress ENT - no lesions, no post nasal drip Neck: No JVD, no thyromegaly, no carotid bruits Lungs: no use of accessory muscles, no dullness to percussion, decreased without rales or rhonchi  Cardiovascular: Rhythm regular, heart sounds  normal, no murmurs or gallops, no peripheral edema Musculoskeletal: No deformities, no cyanosis or clubbing , no tremors       Assessment & Plan:    Assessment and Plan Given her high symptom burden, I reviewed two new medications for COPD with their side effect profile including Dupixent and PDE/5 inhibitor  Chronic respiratory failure with hypoxia and hypercarbia -continue nocturnal NIV     Chronic Obstructive Pulmonary Disease (COPD)  COPD with chronic fatigue, dyspnea, and fluid retention. Increased fatigue and fluid retention despite current treatment. Currently on prednisone 5 mg daily, intermittently increasing the dose. Using Trelegy and Lasix. Discussed new treatments: nebulized  medication and Dupixent injection. Dupixent, recently approved for COPD, may cause allergic reactions but is generally well tolerated. Both treatments are expensive and require insurance approval. - Start approval process for Dupixent injection - Continue Trelegy and Lasix - Prescribe prednisone 10 mg tablets - Schedule follow-up in three months to evaluate new treatments  Fluid Retention Persistent fluid retention, particularly in the hands, despite Lasix. Difficulty making a fist in the mornings and puffy hands. Lasix provides some relief but symptoms persist. - Continue Lasix - Monitor fluid retention and adjust treatment as necessary  Weight Gain Weight gain likely secondary to chronic prednisone use. No significant changes in diet and regular exercise. Prednisone can alter metabolism and contribute to weight gain. - Discuss weight management strategies - Encourage continued exercise regimen  General Health Maintenance Maintaining regular exercise routine and plans to switch insurance in January. - Encourage continued exercise - Wait until January to finalize insurance changes and start new treatments  Follow-up - Schedule follow-up in three months - Send reminder for Dupixent approval process at the beginning of the year.

## 2023-10-08 NOTE — Patient Instructions (Signed)
X refill on prednisone 10 mg  daily x 60 x 2  X Start approval for dupixent

## 2023-10-10 ENCOUNTER — Telehealth: Payer: Self-pay | Admitting: Pharmacist

## 2023-10-10 NOTE — Telephone Encounter (Signed)
Based on Dr. Reginia Naas OV note, patient is having change to insurance in January 2025  Dose for Dupixent for COPD is 300mg  SQ every 14 days (no loading dose)  Chesley Mires, PharmD, MPH, BCPS, CPP Clinical Pharmacist (Rheumatology and Pulmonology)

## 2023-10-13 MED ORDER — PREDNISONE 10 MG PO TABS: 10.0000 mg | ORAL_TABLET | Freq: Two times a day (BID) | ORAL | 1 refills | Status: DC | PRN

## 2023-10-13 NOTE — Addendum Note (Signed)
Addended by: Jama Flavors on: 10/13/2023 09:47 AM   Modules accepted: Orders

## 2023-10-15 DIAGNOSIS — J449 Chronic obstructive pulmonary disease, unspecified: Secondary | ICD-10-CM | POA: Diagnosis not present

## 2023-10-16 DIAGNOSIS — J449 Chronic obstructive pulmonary disease, unspecified: Secondary | ICD-10-CM | POA: Diagnosis not present

## 2023-10-22 ENCOUNTER — Ambulatory Visit
Admission: EM | Admit: 2023-10-22 | Discharge: 2023-10-22 | Disposition: A | Discharge status: Home / Self Care | Payer: Medicare Other | Attending: Physician Assistant | Admitting: Physician Assistant

## 2023-10-22 DIAGNOSIS — H6993 Unspecified Eustachian tube disorder, bilateral: Secondary | ICD-10-CM | POA: Diagnosis not present

## 2023-10-22 DIAGNOSIS — J069 Acute upper respiratory infection, unspecified: Secondary | ICD-10-CM

## 2023-10-22 DIAGNOSIS — H65191 Other acute nonsuppurative otitis media, right ear: Secondary | ICD-10-CM

## 2023-10-22 MED ORDER — FLUTICASONE PROPIONATE 50 MCG/ACT NA SUSP: 1.0000 | Freq: Every day | NASAL | 0 refills | Status: AC

## 2023-10-22 MED ORDER — DOXYCYCLINE HYCLATE 100 MG PO CAPS: 100.0000 mg | ORAL_CAPSULE | Freq: Two times a day (BID) | ORAL | 0 refills | Status: AC

## 2023-10-22 NOTE — ED Triage Notes (Signed)
 Patient presents with sore throat, ears are hot inside and headache x 3 days. Treated with eardrops, Tylenol and Nebulizer.

## 2023-10-26 NOTE — ED Provider Notes (Signed)
 [name]    CSN: [id number] Arrival date & time: 10/22/23  0802      History   Chief Complaint No chief complaint on file.   HPI [name] is a 68 y.o. female.   Patient presents today for evaluation of sore throat, ear discomfort, and headache that they have had for 3 days.  She reports she has used eardrops Tylenol  and her nebulizer without resolution.  She has not had fever.  The history is provided by the patient.    Past Medical History:  Diagnosis Date   Bronchitis    CAD (coronary artery disease)    a. 02/2002: CABG x3 with LIMA to LAD, RIMA to RCA, and SVG to OM   CHF (congestive heart failure) (HCC)    COPD (chronic obstructive pulmonary disease) (HCC)    Diabetes (HCC)    Headache    Heart disease    Hyperlipidemia    Hypertension    Hypertensive emergency 07/01/2013   Impaired glucose tolerance 08/20/2014   Sinusitis     Patient Active Problem List   Diagnosis Date Noted   Multiple bruises 05/31/2023   Lump of right breast 05/31/2023   Alcohol abuse 02/17/2023   Chronic idiopathic constipation 02/17/2023   Family history of colon cancer 02/17/2023   Imaging of gastrointestinal tract abnormal 02/17/2023   Fatty liver 02/17/2023   Obesity 02/17/2023   Estrogen deficiency 11/14/2022   COPD exacerbation (HCC) 11/07/2022   Pain, abdominal, RLQ 06/28/2022   Low back pain 03/14/2022   Constipation 03/14/2022   Tobacco abuse 07/11/2021   Abnormal mammogram 04/13/2021   Trichomonal vulvovaginitis 04/13/2021   Vaginal discharge 04/13/2021   Aortic atherosclerosis (HCC) 04/13/2021   Tracheomalacia 11/23/2020   Vitamin D  deficiency 01/03/2020   HLD (hyperlipidemia) 01/03/2020   Acute pancreatitis 09/09/2019   Epigastric pain 09/06/2019   Back pain 09/06/2019   Eustachian tube dysfunction 05/18/2019   Insomnia 05/18/2019   Chronic respiratory failure with hypoxia and hypercapnia (HCC) 12/14/2018   Chronic obstructive lung disease  (HCC) 03/18/2017   Former smoker 03/07/2017   Left lumbar radiculopathy 10/19/2015   Cough 08/29/2015   Pedal edema 02/22/2015   Diabetes (HCC) 08/20/2014   Pruritus 07/08/2014   Nocturnal hypoxemia 07/05/2014   Esophagus, foreign body 06/12/2014   Polycythemia, secondary 02/16/2014   5 mm Lung nodule, solitary 02/02/2014   GERD (gastroesophageal reflux disease) 02/01/2014   Hypersomnolence 02/01/2014   Allergic rhinitis 09/03/2013   CAD (coronary artery disease) 09/03/2013   Encounter for well adult exam with abnormal findings 09/03/2013   Essential hypertension 07/07/2013   COPD (chronic obstructive pulmonary disease) with chronic bronchitis (HCC) 07/07/2013   Status post coronary artery bypass grafting 07/07/2013   Headache 07/01/2013   Polycythemia 07/01/2013    Past Surgical History:  Procedure Laterality Date   CORONARY ARTERY BYPASS GRAFT  2003   Triple bypass   ESOPHAGOGASTRODUODENOSCOPY Left 06/13/2014   Procedure: ESOPHAGOGASTRODUODENOSCOPY (EGD);  Surgeon: Renaye Sous, MD;  Location: [facility] ENDOSCOPY;  Service: Endoscopy;  Laterality: Left;   RIGID ESOPHAGOSCOPY N/A 06/12/2014   Procedure: RIGID ESOPHAGOSCOPY WITH FOREIGN BODY REMOVAL;  Surgeon: Ana LELON Moccasin, MD;  Location: [facility] OR;  Service: ENT;  Laterality: N/A;    OB History   No obstetric history on file.      Home Medications    Prior to Admission medications   Medication Sig Start Date End Date Taking? Authorizing Provider  doxycycline  (VIBRAMYCIN ) 100 MG capsule Take 1 capsule (100 mg  total) by mouth 2 (two) times daily for 7 days. 10/22/23 10/29/23 Yes Billy Asberry FALCON, PA-C  fluticasone  (FLONASE ) 50 MCG/ACT nasal spray Place 1 spray into both nostrils daily. 10/22/23  Yes Billy Asberry FALCON, PA-C  acetaminophen  (TYLENOL ) 500 MG tablet Take 1,000 mg by mouth 2 (two) times daily as needed for headache, fever, moderate pain or mild pain.    [provider]  albuterol  (PROVENTIL ) (2.5 MG/3ML) 0.083% nebulizer  solution Take 3 mLs (2.5 mg total) by nebulization every 6 (six) hours as needed for wheezing or shortness of breath. 07/17/22   [name] Harden GAILS, MD  albuterol  (VENTOLIN  HFA) 108 (90 Base) MCG/ACT inhaler INHALE 1-2 PUFFS BY MOUTH EVERY 6 HOURS AS NEEDED FOR WHEEZE OR SHORTNESS OF BREATH 09/26/23   [name] Harden GAILS, MD  alendronate  (FOSAMAX ) 70 MG tablet Take 70 mg by mouth once a week. 09/05/23   [provider]  amLODipine  (NORVASC ) 5 MG tablet TAKE 1 TABLET (5 MG TOTAL) BY MOUTH DAILY. 06/02/23   Jeffrie Oneil BROCKS, MD  atorvastatin  (LIPITOR) 40 MG tablet Take 1 tablet (40 mg total) by mouth daily. 04/11/23   Norleen Lynwood ORN, MD  bisoprolol  (ZEBETA ) 5 MG tablet TAKE 1 TABLET (5 MG TOTAL) BY MOUTH DAILY. 09/05/23   Jeffrie Oneil BROCKS, MD  Cholecalciferol  (VITAMIN D -3 PO) Take 1 capsule by mouth in the morning.    [provider]  Cyanocobalamin  (VITAMIN B-12 PO) Take 1 tablet by mouth in the morning.    [provider]  Dulaglutide  (TRULICITY ) 1.5 MG/0.5ML SOPN Inject 1.5 mg into the skin once a week. 08/07/23   Norleen Lynwood ORN, MD  Fluticasone -Umeclidin-Vilant (TRELEGY ELLIPTA ) 200-62.5-25 MCG/ACT AEPB INHALE 1 PUFF BY MOUTH EVERY DAY 07/05/23   [name] Harden GAILS, MD  furosemide  (LASIX ) 20 MG tablet Take 1 tablet (20 mg total) by mouth daily for 3 days. 04/02/23 04/05/23  Norleen Lynwood ORN, MD  ipratropium-albuterol  (DUONEB) 0.5-2.5 (3) MG/3ML SOLN INHALE 3 ML BY NEBULIZER EVERY 6 HOURS AS NEEDED 09/19/23   [name] Harden GAILS, MD  isosorbide  mononitrate (IMDUR ) 60 MG 24 hr tablet TAKE 1.5 TABLETS (90 MG TOTAL) BY MOUTH DAILY. 07/07/23   Jeffrie Oneil BROCKS, MD  KLOR-CON  M10 10 MEQ tablet TAKE 1 TABLET BY MOUTH EVERY DAY 09/25/23   Norleen Lynwood ORN, MD  MAGNESIUM  OXIDE PO Take 1 tablet by mouth at bedtime.    [provider]  metFORMIN  (GLUCOPHAGE -XR) 500 MG 24 hr tablet TAKE 2 TABLET BY MOUTH EVERY DAY WITH BREAKFAST 08/31/23   Norleen Lynwood ORN, MD  Multiple Vitamins-Minerals (MULTIVITAMIN WOMEN 50+) TABS  Take 1 tablet by mouth in the morning.    [provider]  predniSONE  (DELTASONE ) 10 MG tablet Take 1-2 tablets (10-20 mg total) by mouth 2 (two) times daily as needed. 10/13/23   [name] Harden GAILS, MD  predniSONE  (DELTASONE ) 5 MG tablet Take 5 mg by mouth 3 (three) times a week. 09/05/23   [provider]    Family History Family History  Problem Relation Age of Onset   Lung cancer Paternal Uncle    Heart disease Mother    Brain cancer Mother    Cancer Maternal Grandmother        colon   Stroke Other    Cancer Other        x 4 aunts   Lung cancer Maternal Aunt    Suicidality Brother     Social History Social History   Tobacco Use   Smoking  status: Some Days    Current packs/day: 0.00    Average packs/day: 0.5 packs/day for 47.4 years (23.7 ttl pk-yrs)    Types: Cigarettes    Start date: 12/29/1971    Last attempt to quit: 05/22/2019    Years since quitting: 4.4   Smokeless tobacco: Never   Tobacco comments:    Smoking daily 1-2 day.  Stops for a while and then goes back.  08/29/2022 hfb  Substance Use Topics   Alcohol use: Yes    Alcohol/week: 5.0 - 6.0 standard drinks of alcohol    Types: 5 - 6 Glasses of wine per week   Drug use: Not Currently    Types: Marijuana    Comment: as a teenager     Allergies   Augmentin  [amoxicillin -pot clavulanate] and Chantix  [varenicline ]   Review of Systems Review of Systems  Constitutional:  Negative for chills and fever.  HENT:  Positive for congestion, ear pain and sore throat.   Eyes:  Negative for discharge and redness.  Respiratory:  Positive for cough. Negative for shortness of breath and wheezing.   Gastrointestinal:  Negative for abdominal pain, diarrhea, nausea and vomiting.  Neurological:  Positive for headaches.     Physical Exam Triage Vital Signs ED Triage Vitals  Encounter Vitals Group     BP 10/22/23 0825 (!) 145/64     Systolic BP Percentile --      Diastolic BP Percentile --      Pulse  Rate 10/22/23 0825 63     Resp 10/22/23 0825 16     Temp 10/22/23 0825 98.7 F (37.1 C)     Temp src --      SpO2 10/22/23 0825 98 %     Weight 10/22/23 0823 190 lb (86.2 kg)     Height 10/22/23 0823 5' 1 (1.549 m)     Head Circumference --      Peak Flow --      Pain Score 10/22/23 0823 7     Pain Loc --      Pain Education --      Exclude from Growth Chart --    No data found.  Updated Vital Signs BP (!) 145/64 (BP Location: Left Arm)   Pulse 63   Temp 98.7 F (37.1 C)   Resp 16   Ht 5' 1 (1.549 m)   Wt 190 lb (86.2 kg)   SpO2 98%   BMI 35.90 kg/m   Visual Acuity Right Eye Distance:   Left Eye Distance:   Bilateral Distance:    Right Eye Near:   Left Eye Near:    Bilateral Near:     Physical Exam Vitals and nursing note reviewed.  Constitutional:      General: She is not in acute distress.    Appearance: Normal appearance. She is not ill-appearing.  HENT:     Head: Normocephalic and atraumatic.     Left Ear: Tympanic membrane normal.     Ears:     Comments: Right TM erythematous.    Nose: Congestion present.     Mouth/Throat:     Mouth: Mucous membranes are moist.     Pharynx: No oropharyngeal exudate or posterior oropharyngeal erythema.  Eyes:     Conjunctiva/sclera: Conjunctivae normal.  Cardiovascular:     Rate and Rhythm: Normal rate and regular rhythm.     Heart sounds: Normal heart sounds. No murmur heard. Pulmonary:     Effort: Pulmonary effort is normal. No respiratory  distress.     Breath sounds: Normal breath sounds. No wheezing, rhonchi or rales.  Skin:    General: Skin is warm and dry.  Neurological:     Mental Status: She is alert.  Psychiatric:        Mood and Affect: Mood normal.        Thought Content: Thought content normal.      UC Treatments / Results  Labs (all labs ordered are listed, but only abnormal results are displayed) Labs Reviewed - No data to display  EKG   Radiology No results  found.  Procedures Procedures (including critical care time)  Medications Ordered in UC Medications - No data to display  Initial Impression / Assessment and Plan / UC Course  I have reviewed the triage vital signs and the nursing notes.  Pertinent labs & imaging results that were available during my care of the patient were reviewed by me and considered in my medical decision making (see chart for details).    Suspect likely viral etiology of respiratory symptoms.  Will treat with doxycycline  to cover otitis media.  Flonase  prescribed for suspected eustachian tube dysfunction.  Encouraged follow-up if no gradual improvement with any further concerns.  Final Clinical Impressions(s) / UC Diagnoses   Final diagnoses:  Acute upper respiratory infection  Other acute nonsuppurative otitis media of right ear, recurrence not specified  Dysfunction of both eustachian tubes   Discharge Instructions   None    ED Prescriptions     Medication Sig Dispense Auth. Provider   doxycycline  (VIBRAMYCIN ) 100 MG capsule Take 1 capsule (100 mg total) by mouth 2 (two) times daily for 7 days. 14 capsule Billy Stabs F, PA-C   fluticasone  (FLONASE ) 50 MCG/ACT nasal spray Place 1 spray into both nostrils daily. 15.8 mL Billy Stabs FALCON, PA-C      PDMP not reviewed this encounter.   Billy Stabs FALCON, PA-C 10/26/23 1220

## 2023-10-27 ENCOUNTER — Other Ambulatory Visit (HOSPITAL_COMMUNITY): Payer: Self-pay

## 2023-10-27 NOTE — Telephone Encounter (Signed)
 Submitted a Prior Authorization request to [facility] for DUPIXENT via CoverMyMeds. Will update once we receive a response.  Key: [id number]

## 2023-11-14 ENCOUNTER — Other Ambulatory Visit (HOSPITAL_COMMUNITY): Payer: Self-pay

## 2023-11-14 NOTE — Telephone Encounter (Signed)
Pt returned call, she tells me she has enrolled into the MPPP and is ready to get started on treatment as soon as possible. I advised that Devki would be reaching out to her to get her scheduled for a New Start visit. I also discussed with her the refill process through [facility]. Pt verbalized understanding to all.

## 2023-11-14 NOTE — Telephone Encounter (Signed)
Received notification from [facility] regarding a prior authorization for DUPIXENT that states that a PA is NOT required for this medication.  Per test claim, copay for 28 days supply is $1,060.00  Patient can fill through [facility]: [phone]   Reached out to pt to discuss. She informs me that she has already paid over $400 for her Trelegy inhaler this year, I therefore recommended that she consider enrollment into the Medicare Payment Plan. Pt is in agreement and will call me back once she has discussed this with her insurance plan. Direct callback number provided.

## 2023-11-15 ENCOUNTER — Encounter: Payer: Self-pay | Admitting: Internal Medicine

## 2023-11-17 ENCOUNTER — Telehealth: Payer: Self-pay

## 2023-11-17 ENCOUNTER — Other Ambulatory Visit (HOSPITAL_COMMUNITY): Payer: Self-pay

## 2023-11-17 ENCOUNTER — Other Ambulatory Visit: Payer: Self-pay

## 2023-11-17 MED ORDER — TRULICITY 1.5 MG/0.5ML ~~LOC~~ SOAJ: 1.5000 mg | SUBCUTANEOUS | 3 refills | Status: DC

## 2023-11-17 NOTE — Telephone Encounter (Signed)
Patient scheduled for Dupixent new start on 11/19/2023

## 2023-11-17 NOTE — Telephone Encounter (Signed)
Pt called me saying that [facility] was giving her trouble about filling her Trulicity, as well as appearing to "not be aware" of the Medicare prescription payment plan. Informed pt that I would call [facility] and teach them how to do their job, however the issue with the Trulicity sounds like it is requiring a prior authorization and will need to be handled by her PCP. Pt verbalized understanding to all.  Contacted [facility] and provided plan info. Confirmed that it was able to be billed successfully when rep readjudicated her alburterol nebs. Called pt back and provided update.

## 2023-11-17 NOTE — Telephone Encounter (Signed)
Patient is needing for dr Oliver Barre to send over the prescription are approve the medication for patient she called her insurance company and this is what she was told to do she would like a call back

## 2023-11-18 ENCOUNTER — Telehealth: Payer: Self-pay | Admitting: Pharmacy Technician

## 2023-11-18 ENCOUNTER — Other Ambulatory Visit (HOSPITAL_COMMUNITY): Payer: Self-pay

## 2023-11-18 NOTE — Telephone Encounter (Signed)
Pharmacy Patient Advocate Encounter   Received notification from CoverMyMeds that prior authorization for Trulicity 1.5MG /0.5ML auto-injectors is required/requested.   Insurance verification completed.   The patient is insured through [facility] [employer] .   Per test claim: PA required; PA submitted to above mentioned insurance via CoverMyMeds Key/confirmation #/EOC [id number] Status is pending

## 2023-11-18 NOTE — Telephone Encounter (Signed)
Copied from CRM [id number]. Topic: Clinical - Prescription Issue >> Nov 18, 2023 11:52 AM [name] wrote: Reason for CRM: Patient is requesting to speak with a CMA regarding her Trulicity as she is out of the medication and her blood sugar is at 225 - Patient was advised that the prior authorization has been submitted and that the clinic is waiting for an update.

## 2023-11-19 ENCOUNTER — Ambulatory Visit: Payer: Medicare Other | Admitting: Pharmacist

## 2023-11-19 ENCOUNTER — Other Ambulatory Visit: Payer: Self-pay | Admitting: Pharmacist

## 2023-11-19 ENCOUNTER — Other Ambulatory Visit (HOSPITAL_COMMUNITY): Payer: Self-pay

## 2023-11-19 DIAGNOSIS — Z7189 Other specified counseling: Secondary | ICD-10-CM

## 2023-11-19 DIAGNOSIS — J4489 Other specified chronic obstructive pulmonary disease: Secondary | ICD-10-CM

## 2023-11-19 DIAGNOSIS — J9610 Chronic respiratory failure, unspecified whether with hypoxia or hypercapnia: Secondary | ICD-10-CM | POA: Diagnosis not present

## 2023-11-19 DIAGNOSIS — J449 Chronic obstructive pulmonary disease, unspecified: Secondary | ICD-10-CM | POA: Diagnosis not present

## 2023-11-19 MED ORDER — DUPIXENT 300 MG/2ML ~~LOC~~ SOAJ
300.0000 mg | SUBCUTANEOUS | 1 refills | Status: DC
  Filled 2023-11-25: qty 4, 28d supply, fill #0
  Filled 2023-12-17: qty 4, 28d supply, fill #1
  Filled 2024-01-13: qty 4, 28d supply, fill #2
  Filled 2024-02-16: qty 4, 28d supply, fill #3
  Filled 2024-03-12: qty 4, 28d supply, fill #4

## 2023-11-19 NOTE — Patient Instructions (Signed)
Your next DUPIXENT dose is due on 12/03/23, 12/17/23, and every 14 days thereafter  CONTINUE trelegy one puff daily and all of your inhaled medications  Your prescription will be shipped from [facility]. Their phone number is [phone] Mills Koller, our patient advocate, will call to schedule shipment and confirm address. They will mail your medication to your home.  You will need to be seen by your provider in 3 to 4 months to assess how Dupixent is working for you. Please ensure you have a follow-up appointment scheduled in May 2025. Call our clinic if you need to make this appointment.  Stay up to date on all routine vaccines: influenza, pneumonia, COVID19, Shingles  How to manage an injection site reaction: Remember the 5 C's: COUNTER - leave on the counter at least 30 minutes but up to overnight to bring medication to room temperature. This may help prevent stinging COLD - place something cold (like an ice gel pack or cold water bottle) on the injection site just before cleansing with alcohol. This may help reduce pain CLARITIN - use Claritin (generic name is loratadine) for the first two weeks of treatment or the day of, the day before, and the day after injecting. This will help to minimize injection site reactions CORTISONE CREAM - apply if injection site is irritated and itching CALL ME - if injection site reaction is bigger than the size of your fist, looks infected, blisters, or if you develop hives

## 2023-11-19 NOTE — Progress Notes (Signed)
HPI Patient presents today to [facility] to see pharmacy team for Dupixent new start for COPD. She is accompanied by her sister who administers Trulicty injections for her.   Respiratory Medications Current regimen: Trelegy 200-62.5-25mcg (1 puff once daily), prednisone 10mg  daily Patient reports no known adherence challenges  OBJECTIVE Allergies  Allergen Reactions   Augmentin [Amoxicillin-Pot Clavulanate] Nausea And Vomiting   Chantix [Varenicline] Nausea And Vomiting    Outpatient Encounter Medications as of 11/19/2023  Medication Sig Note   acetaminophen (TYLENOL) 500 MG tablet Take 1,000 mg by mouth 2 (two) times daily as needed for headache, fever, moderate pain or mild pain.    albuterol (PROVENTIL) (2.5 MG/3ML) 0.083% nebulizer solution Take 3 mLs (2.5 mg total) by nebulization every 6 (six) hours as needed for wheezing or shortness of breath. 11/07/2022: Pt states she used both albuterol AND Duoneb this morning.   albuterol (VENTOLIN HFA) 108 (90 Base) MCG/ACT inhaler INHALE 1-2 PUFFS BY MOUTH EVERY 6 HOURS AS NEEDED FOR WHEEZE OR SHORTNESS OF BREATH    alendronate (FOSAMAX) 70 MG tablet Take 70 mg by mouth once a week.    amLODipine (NORVASC) 5 MG tablet TAKE 1 TABLET (5 MG TOTAL) BY MOUTH DAILY.    atorvastatin (LIPITOR) 40 MG tablet Take 1 tablet (40 mg total) by mouth daily.    bisoprolol (ZEBETA) 5 MG tablet TAKE 1 TABLET (5 MG TOTAL) BY MOUTH DAILY.    Cholecalciferol (VITAMIN D-3 PO) Take 1 capsule by mouth in the morning.    Cyanocobalamin (VITAMIN B-12 PO) Take 1 tablet by mouth in the morning.    Dulaglutide (TRULICITY) 1.5 MG/0.5ML SOAJ Inject 1.5 mg into the skin once a week.    fluticasone (FLONASE) 50 MCG/ACT nasal spray Place 1 spray into both nostrils daily.    Fluticasone-Umeclidin-Vilant (TRELEGY ELLIPTA) 200-62.5-25 MCG/ACT AEPB INHALE 1 PUFF BY MOUTH EVERY DAY    furosemide (LASIX) 20 MG tablet Take 1 tablet (20 mg total) by mouth daily for 3 days.     ipratropium-albuterol (DUONEB) 0.5-2.5 (3) MG/3ML SOLN INHALE 3 ML BY NEBULIZER EVERY 6 HOURS AS NEEDED    isosorbide mononitrate (IMDUR) 60 MG 24 hr tablet TAKE 1.5 TABLETS (90 MG TOTAL) BY MOUTH DAILY.    KLOR-CON M10 10 MEQ tablet TAKE 1 TABLET BY MOUTH EVERY DAY    MAGNESIUM OXIDE PO Take 1 tablet by mouth at bedtime.    metFORMIN (GLUCOPHAGE-XR) 500 MG 24 hr tablet TAKE 2 TABLET BY MOUTH EVERY DAY WITH BREAKFAST    Multiple Vitamins-Minerals (MULTIVITAMIN WOMEN 50+) TABS Take 1 tablet by mouth in the morning.    predniSONE (DELTASONE) 10 MG tablet Take 1-2 tablets (10-20 mg total) by mouth 2 (two) times daily as needed.    predniSONE (DELTASONE) 5 MG tablet Take 5 mg by mouth 3 (three) times a week.    Facility-Administered Encounter Medications as of 11/19/2023  Medication   nicotine (NICOTROL) 10 MG inhaler 1 continuous puffing   regadenoson (LEXISCAN) injection SOLN 0.4 mg   technetium tetrofosmin (TC-MYOVIEW) injection 30 millicurie     Immunization History  Administered Date(s) Administered   Fluad Quad(high Dose 65+) 07/10/2022   Fluad Trivalent(High Dose 65+) 08/29/2023   Influenza Split 07/08/2013   Influenza,inj,Quad PF,6+ Mos 07/31/2015, 09/03/2017, 10/04/2021   PFIZER Comirnaty(Gray Top)Covid-19 Tri-Sucrose Vaccine 08/31/2020   PFIZER(Purple Top)SARS-COV-2 Vaccination 01/01/2020, 01/25/2020   PNEUMOCOCCAL CONJUGATE-20 11/14/2022   Pfizer(Comirnaty)Fall Seasonal Vaccine 12 years and older 07/15/2023   Pneumococcal Polysaccharide-23 02/02/2014, 05/03/2019  PFTs    Latest Ref Rng & Units 05/03/2019    3:06 PM  PFT Results  FVC-Pre L 1.05   FVC-Predicted Pre % 44   FVC-Post L 1.00   FVC-Predicted Post % 42   Pre FEV1/FVC % % 49   Post FEV1/FCV % % 47   FEV1-Pre L 0.52   FEV1-Predicted Pre % 28   FEV1-Post L 0.47   DLCO uncorrected ml/min/mmHg 10.16   DLCO UNC% % 54   DLCO corrected ml/min/mmHg 9.48   DLCO COR %Predicted % 50   DLVA Predicted % 87   TLC  L 3.81   TLC % Predicted % 80   RV % Predicted % 136     Eosinophils Most recent blood eosinophil count was 319 cells/microL taken on 05/30/2023.   Assessment   Biologics training for dupilumab (Dupixent)  Goals of therapy: Mechanism: human monoclonal IgG4 antibody that inhibits interleukin-4 and interleukin-13 cytokine-induced responses, including release of proinflammatory cytokines, chemokines, and IgE Reviewed that Dupixent is add-on medication and patient must continue maintenance inhaler regimen. Response to therapy: may take 4 months to determine efficacy. Discussed that patients generally feel improvement sooner than 4 months.  Side effects: injection site reaction (6-18%), antibody development (5-16%), ophthalmic conjunctivitis (2-16%), transient blood eosinophilia (1-2%)  Dose: 300mg  every 14 days (f0r COPD)  Administration/Storage:  Reviewed administration sites of thigh or abdomen (at least 2-3 inches away from abdomen). Reviewed the upper arm is only appropriate if caregiver is administering injection  Do not shake pen/syringe as this could lead to product foaming or precipitation. Do not use if solution is discolored or contains particulate matter or if window on prefilled pen is yellow (indicates pen has been used).  Reviewed storage of medication in refrigerator. Reviewed that Dupixent can be stored at room temperature in unopened carton for up to 14 days.  Access: Approval of Dupixent through: insurance. Patient has enrolled into Medicare Prescription Payment Plan.  Patient self-administered Dupixent 300mg /67ml x 2 (total dose 600mg ) in left lower abdomen using sample Dupixent 300mg /53mL autoinjector pen NDC: [address]-40 Lot: [id number] Expiration: 01/18/2025  Patient monitored for 30 minutes for adverse reaction.  Patient tolerated well.  Injection site noted. Patient denies itchiness and irritation at injection., No swelling or redness noted., and Reviewed injection  site reaction management with patient verbally and printed information for review in AVS  Medication Reconciliation  A drug regimen assessment was performed, including review of allergies, interactions, disease-state management, dosing and immunization history. Medications were reviewed with the patient, including name, instructions, indication, goals of therapy, potential side effects, importance of adherence, and safe use.  Drug interaction(s): none noted  PLAN Continue Dupixent 300mg  every 14 days.  Next dose is due 12/03/23 and every 14 days thereafter. Rx sent to: [facility]: [phone] .  Patient provided with pharmacy phone number and advised that they will call to schedule shipment to home.  Continue maintenance regimen of: Trelegy 200-62.5-25mcg (1 puff once daily), prednisone 10mg  daily  All questions encouraged and answered.  Instructed patient to reach out with any further questions or concerns.  Thank you for allowing pharmacy to participate in this patient's care.  This appointment required 45 minutes of patient care (this includes precharting, chart review, review of results, face-to-face care, etc.).

## 2023-11-19 NOTE — Progress Notes (Unsigned)
Patient completed first dose of Dupixent for COPD in office today.  Please see OV note from 11/19/2023  Tolerated well. Her sister administers medications for her. Continue Dupixent 300mg  SQ every 14 days with Trelegy one pfuf one daily. She is also on steroid - 10mg -20mg  dailty  Chesley Mires, PharmD, MPH, BCPS, CPP Clinical Pharmacist (Rheumatology and Pulmonology)

## 2023-11-19 NOTE — Telephone Encounter (Signed)
Pharmacy Patient Advocate Encounter  Received notification from [facility] that Prior Authorization for Trulicity 1.5MG /0.5ML auto-injectors has been APPROVED from 11/18/2023 to 11/17/2024. Ran test claim, Copay is $0.00. This test claim was processed through [facility]- copay amounts may vary at other pharmacies due to pharmacy/plan contracts, or as the patient moves through the different stages of their insurance plan.   PA #/Case ID/Reference #: [id number]

## 2023-11-20 ENCOUNTER — Other Ambulatory Visit (HOSPITAL_COMMUNITY): Payer: Self-pay

## 2023-11-21 ENCOUNTER — Other Ambulatory Visit: Payer: Self-pay

## 2023-11-24 ENCOUNTER — Encounter (HOSPITAL_BASED_OUTPATIENT_CLINIC_OR_DEPARTMENT_OTHER): Payer: Self-pay | Admitting: Pulmonary Disease

## 2023-11-25 ENCOUNTER — Other Ambulatory Visit: Payer: Self-pay

## 2023-11-25 ENCOUNTER — Other Ambulatory Visit (HOSPITAL_COMMUNITY): Payer: Self-pay

## 2023-11-25 ENCOUNTER — Telehealth: Payer: Self-pay

## 2023-11-25 NOTE — Telephone Encounter (Signed)
 Pharmacy Patient Advocate Encounter   Received notification from Patient Advice Request messages that prior authorization for Albuterol  (2.5 MG/3ML) 0.083% nebulizer solutio is required/requested.   Insurance verification completed.   The patient is insured through  [facility]  .   Per test claim: The current 22 day co-pay is, $1.89.  No PA needed at this time. This test claim was processed through [facility]- copay amounts may vary at other pharmacies due to pharmacy/plan contracts, or as the patient moves through the different stages of their insurance plan.

## 2023-11-25 NOTE — Progress Notes (Signed)
Specialty Pharmacy Initial Fill Coordination Note  [name] is a 68 y.o. female contacted today regarding initial fill of specialty medication(s) Dupilumab (Dupixent)   Patient requested Delivery   Delivery date: 11/27/23   Verified address: [address]   [place] [address]   Medication will be filled on 11/26/23.   Patient is enrolled into the MPPP and is aware of $0 copayment.

## 2023-11-25 NOTE — Telephone Encounter (Signed)
No prior authorization required. For additional info see Pharmacy Prior Auth telephone encounter from 11-25-2023.  Run following info at pharmacy to get co-pay BIN [id number] PCN [facility] GRP NCPARTD ID [id number]

## 2023-12-02 ENCOUNTER — Encounter: Payer: Self-pay | Admitting: Cardiology

## 2023-12-07 ENCOUNTER — Inpatient Hospital Stay (HOSPITAL_COMMUNITY)
Admission: EM | Admit: 2023-12-07 | Discharge: 2023-12-13 | DRG: 189 | Disposition: A | Discharge status: Home / Self Care | Payer: Medicare Other | Attending: Internal Medicine | Admitting: Internal Medicine

## 2023-12-07 ENCOUNTER — Other Ambulatory Visit: Payer: Self-pay

## 2023-12-07 ENCOUNTER — Emergency Department (HOSPITAL_COMMUNITY): Payer: Medicare Other

## 2023-12-07 DIAGNOSIS — A419 Sepsis, unspecified organism: Secondary | ICD-10-CM | POA: Diagnosis not present

## 2023-12-07 DIAGNOSIS — I2510 Atherosclerotic heart disease of native coronary artery without angina pectoris: Secondary | ICD-10-CM | POA: Diagnosis present

## 2023-12-07 DIAGNOSIS — Z818 Family history of other mental and behavioral disorders: Secondary | ICD-10-CM | POA: Diagnosis not present

## 2023-12-07 DIAGNOSIS — Z951 Presence of aortocoronary bypass graft: Secondary | ICD-10-CM

## 2023-12-07 DIAGNOSIS — Z9981 Dependence on supplemental oxygen: Secondary | ICD-10-CM | POA: Diagnosis not present

## 2023-12-07 DIAGNOSIS — E785 Hyperlipidemia, unspecified: Secondary | ICD-10-CM | POA: Diagnosis not present

## 2023-12-07 DIAGNOSIS — Z7985 Long-term (current) use of injectable non-insulin antidiabetic drugs: Secondary | ICD-10-CM

## 2023-12-07 DIAGNOSIS — Z880 Allergy status to penicillin: Secondary | ICD-10-CM

## 2023-12-07 DIAGNOSIS — Z7984 Long term (current) use of oral hypoglycemic drugs: Secondary | ICD-10-CM

## 2023-12-07 DIAGNOSIS — J9622 Acute and chronic respiratory failure with hypercapnia: Secondary | ICD-10-CM | POA: Diagnosis present

## 2023-12-07 DIAGNOSIS — Z888 Allergy status to other drugs, medicaments and biological substances status: Secondary | ICD-10-CM

## 2023-12-07 DIAGNOSIS — R079 Chest pain, unspecified: Secondary | ICD-10-CM | POA: Diagnosis not present

## 2023-12-07 DIAGNOSIS — B9729 Other coronavirus as the cause of diseases classified elsewhere: Secondary | ICD-10-CM | POA: Diagnosis not present

## 2023-12-07 DIAGNOSIS — E1165 Type 2 diabetes mellitus with hyperglycemia: Secondary | ICD-10-CM | POA: Diagnosis present

## 2023-12-07 DIAGNOSIS — Z801 Family history of malignant neoplasm of trachea, bronchus and lung: Secondary | ICD-10-CM | POA: Diagnosis not present

## 2023-12-07 DIAGNOSIS — I5032 Chronic diastolic (congestive) heart failure: Secondary | ICD-10-CM | POA: Diagnosis present

## 2023-12-07 DIAGNOSIS — E119 Type 2 diabetes mellitus without complications: Secondary | ICD-10-CM | POA: Diagnosis not present

## 2023-12-07 DIAGNOSIS — J441 Chronic obstructive pulmonary disease with (acute) exacerbation: Secondary | ICD-10-CM | POA: Diagnosis not present

## 2023-12-07 DIAGNOSIS — F17210 Nicotine dependence, cigarettes, uncomplicated: Secondary | ICD-10-CM | POA: Diagnosis present

## 2023-12-07 DIAGNOSIS — E66812 Obesity, class 2: Secondary | ICD-10-CM | POA: Diagnosis present

## 2023-12-07 DIAGNOSIS — I509 Heart failure, unspecified: Secondary | ICD-10-CM

## 2023-12-07 DIAGNOSIS — J9621 Acute and chronic respiratory failure with hypoxia: Principal | ICD-10-CM

## 2023-12-07 DIAGNOSIS — Z7952 Long term (current) use of systemic steroids: Secondary | ICD-10-CM

## 2023-12-07 DIAGNOSIS — R062 Wheezing: Secondary | ICD-10-CM | POA: Diagnosis not present

## 2023-12-07 DIAGNOSIS — R0602 Shortness of breath: Secondary | ICD-10-CM | POA: Diagnosis not present

## 2023-12-07 DIAGNOSIS — Z79899 Other long term (current) drug therapy: Secondary | ICD-10-CM | POA: Diagnosis not present

## 2023-12-07 DIAGNOSIS — I110 Hypertensive heart disease with heart failure: Secondary | ICD-10-CM | POA: Diagnosis present

## 2023-12-07 DIAGNOSIS — Z808 Family history of malignant neoplasm of other organs or systems: Secondary | ICD-10-CM | POA: Diagnosis not present

## 2023-12-07 DIAGNOSIS — I10 Essential (primary) hypertension: Secondary | ICD-10-CM | POA: Diagnosis not present

## 2023-12-07 DIAGNOSIS — R6520 Severe sepsis without septic shock: Secondary | ICD-10-CM | POA: Diagnosis present

## 2023-12-07 DIAGNOSIS — R000 Tachycardia, unspecified: Secondary | ICD-10-CM | POA: Diagnosis not present

## 2023-12-07 DIAGNOSIS — Z6835 Body mass index (BMI) 35.0-35.9, adult: Secondary | ICD-10-CM

## 2023-12-07 DIAGNOSIS — E872 Acidosis, unspecified: Secondary | ICD-10-CM

## 2023-12-07 DIAGNOSIS — E559 Vitamin D deficiency, unspecified: Secondary | ICD-10-CM | POA: Diagnosis present

## 2023-12-07 DIAGNOSIS — R059 Cough, unspecified: Secondary | ICD-10-CM | POA: Diagnosis not present

## 2023-12-07 DIAGNOSIS — Z8249 Family history of ischemic heart disease and other diseases of the circulatory system: Secondary | ICD-10-CM

## 2023-12-07 DIAGNOSIS — Z1152 Encounter for screening for COVID-19: Secondary | ICD-10-CM | POA: Diagnosis not present

## 2023-12-07 DIAGNOSIS — R918 Other nonspecific abnormal finding of lung field: Secondary | ICD-10-CM | POA: Diagnosis not present

## 2023-12-07 DIAGNOSIS — R069 Unspecified abnormalities of breathing: Secondary | ICD-10-CM | POA: Diagnosis not present

## 2023-12-07 DIAGNOSIS — F419 Anxiety disorder, unspecified: Secondary | ICD-10-CM | POA: Diagnosis present

## 2023-12-07 DIAGNOSIS — Z716 Tobacco abuse counseling: Secondary | ICD-10-CM

## 2023-12-07 LAB — CBC WITH DIFFERENTIAL/PLATELET
Abs Immature Granulocytes: 0.18 10*3/uL — ABNORMAL HIGH (ref 0.00–0.07)
Basophils Absolute: 0.1 10*3/uL (ref 0.0–0.1)
Basophils Relative: 0 %
Eosinophils Absolute: 0 10*3/uL (ref 0.0–0.5)
Eosinophils Relative: 0 %
HCT: 47.6 % — ABNORMAL HIGH (ref 36.0–46.0)
Hemoglobin: 14.8 g/dL (ref 12.0–15.0)
Immature Granulocytes: 1 %
Lymphocytes Relative: 21 %
Lymphs Abs: 4.5 10*3/uL — ABNORMAL HIGH (ref 0.7–4.0)
MCH: 27.8 pg (ref 26.0–34.0)
MCHC: 31.1 g/dL (ref 30.0–36.0)
MCV: 89.3 fL (ref 80.0–100.0)
Monocytes Absolute: 1.6 10*3/uL — ABNORMAL HIGH (ref 0.1–1.0)
Monocytes Relative: 7 %
Neutro Abs: 15.3 10*3/uL — ABNORMAL HIGH (ref 1.7–7.7)
Neutrophils Relative %: 71 %
Platelets: 278 10*3/uL (ref 150–400)
RBC: 5.33 MIL/uL — ABNORMAL HIGH (ref 3.87–5.11)
RDW: 13.8 % (ref 11.5–15.5)
WBC: 21.7 10*3/uL — ABNORMAL HIGH (ref 4.0–10.5)
nRBC: 0 % (ref 0.0–0.2)

## 2023-12-07 LAB — I-STAT VENOUS BLOOD GAS, ED
Acid-Base Excess: 6 mmol/L — ABNORMAL HIGH (ref 0.0–2.0)
Bicarbonate: 37.5 mmol/L — ABNORMAL HIGH (ref 20.0–28.0)
Calcium, Ion: 1.19 mmol/L (ref 1.15–1.40)
HCT: 48 % — ABNORMAL HIGH (ref 36.0–46.0)
Hemoglobin: 16.3 g/dL — ABNORMAL HIGH (ref 12.0–15.0)
O2 Saturation: 92 %
Potassium: 4.1 mmol/L (ref 3.5–5.1)
Sodium: 137 mmol/L (ref 135–145)
TCO2: 40 mmol/L — ABNORMAL HIGH (ref 22–32)
pCO2, Ven: 86 mm[Hg] (ref 44–60)
pH, Ven: 7.248 — ABNORMAL LOW (ref 7.25–7.43)
pO2, Ven: 79 mm[Hg] — ABNORMAL HIGH (ref 32–45)

## 2023-12-07 LAB — PROTIME-INR
INR: 1 (ref 0.8–1.2)
Prothrombin Time: 13 s (ref 11.4–15.2)

## 2023-12-07 LAB — I-STAT CG4 LACTIC ACID, ED: Lactic Acid, Venous: 2 mmol/L (ref 0.5–1.9)

## 2023-12-07 LAB — BRAIN NATRIURETIC PEPTIDE: B Natriuretic Peptide: 133 pg/mL — ABNORMAL HIGH (ref 0.0–100.0)

## 2023-12-07 LAB — APTT: aPTT: 27 s (ref 24–36)

## 2023-12-07 LAB — BASIC METABOLIC PANEL
Anion gap: 8 (ref 5–15)
BUN: 13 mg/dL (ref 8–23)
CO2: 33 mmol/L — ABNORMAL HIGH (ref 22–32)
Calcium: 8.9 mg/dL (ref 8.9–10.3)
Chloride: 95 mmol/L — ABNORMAL LOW (ref 98–111)
Creatinine, Ser: 0.57 mg/dL (ref 0.44–1.00)
GFR, Estimated: >60 mL/min (ref >60)
Glucose, Bld: 339 mg/dL — ABNORMAL HIGH (ref 70–99)
Potassium: 4 mmol/L (ref 3.5–5.1)
Sodium: 136 mmol/L (ref 135–145)

## 2023-12-07 LAB — RESP PANEL BY RT-PCR (RSV, FLU A&B, COVID)  RVPGX2
Influenza A by PCR: NEGATIVE
Influenza B by PCR: NEGATIVE
Resp Syncytial Virus by PCR: NEGATIVE
SARS Coronavirus 2 by RT PCR: NEGATIVE

## 2023-12-07 MED ORDER — MIDAZOLAM HCL 2 MG/2ML IJ SOLN
2.0000 mg | Freq: Once | INTRAMUSCULAR | Status: AC
  Administered 2023-12-07: 2 mg via INTRAVENOUS
  Filled 2023-12-07: qty 2

## 2023-12-07 MED ORDER — SODIUM CHLORIDE 0.9 % IV SOLN
100.0000 mg | Freq: Once | INTRAVENOUS | Status: AC
  Administered 2023-12-08: 100 mg via INTRAVENOUS
  Filled 2023-12-07: qty 100

## 2023-12-07 MED ORDER — SODIUM CHLORIDE 0.9 % IV BOLUS
2000.0000 mL | Freq: Once | INTRAVENOUS | Status: AC
  Administered 2023-12-07: 2000 mL via INTRAVENOUS

## 2023-12-07 MED ORDER — SODIUM CHLORIDE 0.9 % IV SOLN
1.0000 g | Freq: Once | INTRAVENOUS | Status: AC
  Administered 2023-12-07: 1 g via INTRAVENOUS
  Filled 2023-12-07: qty 10

## 2023-12-07 MED ORDER — ALBUTEROL SULFATE (2.5 MG/3ML) 0.083% IN NEBU
10.0000 mg/h | INHALATION_SOLUTION | RESPIRATORY_TRACT | Status: DC
  Administered 2023-12-07: 10 mg/h via RESPIRATORY_TRACT
  Filled 2023-12-07: qty 9

## 2023-12-07 NOTE — H&P (Incomplete)
History and Physical    BRYLEIGH OTTAWAY ACZ:[id number] DOB: 1956-02-21 DOA: 12/07/2023  PCP: Corwin Levins, MD  Patient coming from: Home  Chief Complaint: Shortness of breath  HPI: [name] is a 68 y.o. female with medical history significant of COPD on chronic prednisone, chronic hypoxemic hypercapnic respiratory failure on 3-4 L home oxygen, CAD status post CABG, chronic HFpEF, tobacco abuse, hyperlipidemia, hypertension, type 2 diabetes, allergic rhinitis, GERD, obesity presented to ED via EMS from home for evaluation of respiratory distress.  She was given IV mag 2 g, Solu-Medrol, and DuoNeb x 2.  Brought into the ED on a nonrebreather and upon arrival placed on BiPAP as she continued to be in respiratory distress.  Tachycardic and tachypneic.  Afebrile.  Labs notable for WBC count 21.7, bicarb 33, glucose 339, BNP 133, COVID/influenza/RSV PCR negative, lactic acid 2.0, blood cultures pending.  VBG showing pH 7.24 and pCO2 86.0.  Chest x-ray showing minimal ill-defined patchy opacity at the right lung base which may represent atelectasis or pneumonia. Patient was given continuous albuterol neb treatment, Versed, ceftriaxone, doxycycline, and 2 L normal saline.  TRH called to admit.  ED Course: ***  Review of Systems:  ROS  Past Medical History:  Diagnosis Date  . Bronchitis   . CAD (coronary artery disease)    a. 02/2002: CABG x3 with LIMA to LAD, RIMA to RCA, and SVG to OM  . CHF (congestive heart failure) (HCC)   . COPD (chronic obstructive pulmonary disease) (HCC)   . Diabetes (HCC)   . Headache   . Heart disease   . Hyperlipidemia   . Hypertension   . Hypertensive emergency 07/01/2013  . Impaired glucose tolerance 08/20/2014  . Sinusitis     Past Surgical History:  Procedure Laterality Date  . CORONARY ARTERY BYPASS GRAFT  2003   Triple bypass  . ESOPHAGOGASTRODUODENOSCOPY Left 06/13/2014   Procedure: ESOPHAGOGASTRODUODENOSCOPY (EGD);  Surgeon: Charna Elizabeth, MD;   Location: [facility] ENDOSCOPY;  Service: Endoscopy;  Laterality: Left;  . RIGID ESOPHAGOSCOPY N/A 06/12/2014   Procedure: RIGID ESOPHAGOSCOPY WITH FOREIGN BODY REMOVAL;  Surgeon: Darletta Moll, MD;  Location: [facility] OR;  Service: ENT;  Laterality: N/A;     reports that she has been smoking cigarettes. She started smoking about 51 years ago. She has a 23.7 pack-year smoking history. She has never used smokeless tobacco. She reports current alcohol use of about 5.0 - 6.0 standard drinks of alcohol per week. She reports that she does not currently use drugs after having used the following drugs: Marijuana.  Allergies  Allergen Reactions  . Augmentin [Amoxicillin-Pot Clavulanate] Nausea And Vomiting  . Chantix [Varenicline] Nausea And Vomiting    Family History  Problem Relation Age of Onset  . Lung cancer Paternal Uncle   . Heart disease Mother   . Brain cancer Mother   . Cancer Maternal Grandmother        colon  . Stroke Other   . Cancer Other        x 4 aunts  . Lung cancer Maternal Aunt   . Suicidality Brother     Prior to Admission medications   Medication Sig Start Date End Date Taking? Authorizing Provider  acetaminophen (TYLENOL) 500 MG tablet Take 1,000 mg by mouth 2 (two) times daily as needed for headache, fever, moderate pain or mild pain.    [provider]  albuterol (PROVENTIL) (2.5 MG/3ML) 0.083% nebulizer solution Take 3 mLs (2.5 mg total) by nebulization every  6 (six) hours as needed for wheezing or shortness of breath. 07/17/22   Oretha Milch, MD  albuterol (VENTOLIN HFA) 108 (90 Base) MCG/ACT inhaler INHALE 1-2 PUFFS BY MOUTH EVERY 6 HOURS AS NEEDED FOR WHEEZE OR SHORTNESS OF BREATH 09/26/23   Oretha Milch, MD  amLODipine (NORVASC) 5 MG tablet TAKE 1 TABLET (5 MG TOTAL) BY MOUTH DAILY. 06/02/23   Jake Bathe, MD  atorvastatin (LIPITOR) 40 MG tablet Take 1 tablet (40 mg total) by mouth daily. 04/11/23   Corwin Levins, MD  bisoprolol (ZEBETA) 5 MG tablet TAKE 1 TABLET (5  MG TOTAL) BY MOUTH DAILY. 09/05/23   Jake Bathe, MD  Cholecalciferol (VITAMIN D-3 PO) Take 1 capsule by mouth in the morning.    [provider]  Cyanocobalamin (VITAMIN B-12 PO) Take 1 tablet by mouth in the morning.    [provider]  Dulaglutide (TRULICITY) 1.5 MG/0.5ML SOAJ Inject 1.5 mg into the skin once a week. 11/17/23   Corwin Levins, MD  Dupilumab (DUPIXENT) 300 MG/2ML SOAJ Inject 300 mg into the skin every 14 (fourteen) days. 11/19/23   Oretha Milch, MD  fluticasone (FLONASE) 50 MCG/ACT nasal spray Place 1 spray into both nostrils daily. 10/22/23   Tomi Bamberger, PA-C  Fluticasone-Umeclidin-Vilant (TRELEGY ELLIPTA) 200-62.5-25 MCG/ACT AEPB INHALE 1 PUFF BY MOUTH EVERY DAY 07/05/23   Oretha Milch, MD  furosemide (LASIX) 20 MG tablet Take 20 mg by mouth 3 (three) times a week.    [provider]  ipratropium-albuterol (DUONEB) 0.5-2.5 (3) MG/3ML SOLN INHALE 3 ML BY NEBULIZER EVERY 6 HOURS AS NEEDED 09/19/23   Oretha Milch, MD  isosorbide mononitrate (IMDUR) 60 MG 24 hr tablet TAKE 1.5 TABLETS (90 MG TOTAL) BY MOUTH DAILY. 07/07/23   Jake Bathe, MD  KLOR-CON M10 10 MEQ tablet TAKE 1 TABLET BY MOUTH EVERY DAY Patient taking differently: Take 10 mEq by mouth 3 (three) times a week. 09/25/23   Corwin Levins, MD  MAGNESIUM OXIDE PO Take 1 tablet by mouth at bedtime.    [provider]  metFORMIN (GLUCOPHAGE-XR) 500 MG 24 hr tablet TAKE 2 TABLET BY MOUTH EVERY DAY WITH BREAKFAST 08/31/23   Corwin Levins, MD  Multiple Vitamins-Minerals (MULTIVITAMIN WOMEN 50+) TABS Take 1 tablet by mouth in the morning.    [provider]  predniSONE (DELTASONE) 10 MG tablet Take 1-2 tablets (10-20 mg total) by mouth 2 (two) times daily as needed. 10/13/23   Oretha Milch, MD    Physical Exam: Vitals:   12/07/23 2230 12/07/23 2241 12/07/23 2244 12/07/23 2245  BP: (!) 159/68   139/68  Pulse: (!) 143   (!) 143  Resp: (!) 32   (!) 32  Temp:   98.4 F  (36.9 C)   TempSrc:   Oral   SpO2: 97% 97%  99%  Weight:   86.2 kg   Height:   5\' 1"  (1.549 m)     Physical Exam  Labs on Admission: I have personally reviewed following labs and imaging studies  CBC: Recent Labs  Lab 12/07/23 2228 12/07/23 2234  WBC 21.7*  --   NEUTROABS 15.3*  --   HGB 14.8 16.3*  HCT 47.6* 48.0*  MCV 89.3  --   PLT 278  --    Basic Metabolic Panel: Recent Labs  Lab 12/07/23 2228 12/07/23 2234  NA 136 137  K 4.0 4.1  CL 95*  --  CO2 33*  --   GLUCOSE 339*  --   BUN 13  --   CREATININE 0.57  --   CALCIUM 8.9  --    GFR: Estimated Creatinine Clearance: 67.2 mL/min (by C-G formula based on SCr of 0.57 mg/dL). Liver Function Tests: No results for input(s): "AST", "ALT", "ALKPHOS", "BILITOT", "PROT", "ALBUMIN" in the last 168 hours. No results for input(s): "LIPASE", "AMYLASE" in the last 168 hours. No results for input(s): "AMMONIA" in the last 168 hours. Coagulation Profile: No results for input(s): "INR", "PROTIME" in the last 168 hours. Cardiac Enzymes: No results for input(s): "CKTOTAL", "CKMB", "CKMBINDEX", "TROPONINI" in the last 168 hours. BNP (last 3 results) No results for input(s): "PROBNP" in the last 8760 hours. HbA1C: No results for input(s): "HGBA1C" in the last 72 hours. CBG: No results for input(s): "GLUCAP" in the last 168 hours. Lipid Profile: No results for input(s): "CHOL", "HDL", "LDLCALC", "TRIG", "CHOLHDL", "LDLDIRECT" in the last 72 hours. Thyroid Function Tests: No results for input(s): "TSH", "T4TOTAL", "FREET4", "T3FREE", "THYROIDAB" in the last 72 hours. Anemia Panel: No results for input(s): "VITAMINB12", "FOLATE", "FERRITIN", "TIBC", "IRON", "RETICCTPCT" in the last 72 hours. Urine analysis:    Component Value Date/Time   COLORURINE YELLOW 02/27/2023 0620   APPEARANCEUR HAZY (A) 02/27/2023 0620   LABSPEC 1.023 02/27/2023 0620   PHURINE 6.0 02/27/2023 0620   GLUCOSEU NEGATIVE 02/27/2023 0620   GLUCOSEU  NEGATIVE 02/17/2023 1557   HGBUR NEGATIVE 02/27/2023 0620   BILIRUBINUR NEGATIVE 02/27/2023 0620   BILIRUBINUR negative 02/24/2023 0924   KETONESUR NEGATIVE 02/27/2023 0620   PROTEINUR 30 (A) 02/27/2023 0620   UROBILINOGEN 0.2 02/24/2023 0924   UROBILINOGEN 0.2 02/17/2023 1557   NITRITE NEGATIVE 02/27/2023 0620   LEUKOCYTESUR NEGATIVE 02/27/2023 0620    Radiological Exams on Admission: DG Chest Port 1 View Result Date: 12/07/2023 CLINICAL DATA:  Shortness of breath. EXAM: PORTABLE CHEST 1 VIEW COMPARISON:  02/24/2023 FINDINGS: Prior median sternotomy and CABG.The cardiomediastinal contours are stable, heart size upper normal. Subsegmental atelectasis or scarring in the left mid lung. Minimal ill-defined patchy opacity at the right lung base. No pleural fluid or pneumothorax. No acute osseous abnormalities are seen. IMPRESSION: 1. Minimal ill-defined patchy opacity at the right lung base, may represent atelectasis or pneumonia. 2. Subsegmental atelectasis or scarring in the left mid lung. Electronically Signed   By: Narda Rutherford M.D.   On: 12/07/2023 22:43    EKG: Independently reviewed.  Sinus tachycardia, no STEMI.  Assessment and Plan  Acute on chronic hypoxemic hypercapnic respiratory failure secondary to acute COPD exacerbation Possible community-acquired pneumonia COVID/influenza/RSV PCR negative.  Patient presenting with respiratory distress requiring BiPAP on arrival to the ED.   She was given IV mag 2 g, Solu-Medrol, and DuoNeb x 2.  Brought into the ED on a nonrebreather and upon arrival placed on BiPAP as she continued to be in respiratory distress.  Tachycardic and tachypneic.  Afebrile.  Labs notable for WBC count 21.7, bicarb 33, glucose 339, BNP 133, COVID/influenza/RSV PCR negative, lactic acid 2.0, blood cultures pending.  VBG showing pH 7.24 and pCO2 86.0.  Chest x-ray showing minimal ill-defined patchy opacity at the right lung base which may represent atelectasis or  pneumonia. Patient was given continuous albuterol neb treatment, Versed, ceftriaxone, doxycycline, and 2 L normal saline.  TRH called to admit.  CAD status post CABG  Chronic HFpEF Last echo done in August 2023 showing EF 55 to 60%, grade 2 diastolic dysfunction, trivial MR.  Hyperlipidemia  Hypertension  Type 2 diabetes  GERD  DVT prophylaxis: {Blank single:19197::"Lovenox","SQ Heparin","IV heparin gtt","Xarelto","Eliquis","Coumadin","SCDs","***"} Code Status: {Blank single:19197::"Full Code","DNR","DNR/DNI","Comfort Care","***"} Family Communication: ***  Consults called: ***  Level of care: {Blank single:19197::"Med-Surg","Telemetry bed","Progressive Care Unit","Step Down Unit"} Admission status: *** Time Spent: 75+ minutes***  John Giovanni MD [facility]  If 7PM-7AM, please contact night-coverage [email or web address]  12/07/2023, 11:42 PM

## 2023-12-07 NOTE — ED Provider Notes (Signed)
[facility] AT [facility] Provider Note   CSN: [id number] Arrival date & time: 12/07/23  2217     History {Add pertinent medical, surgical, social history, OB history to HPI:1} Chief Complaint  Patient presents with   Respiratory Distress    [name] is a 68 y.o. female with history of COPD presented to ED with worsening shortness of breath ongoing for several days.  Patient was using CPAP at home, breathing treatments.  EMS give the patient 2 DuoNebs, IV magnesium and IV steroids.   HPI     Home Medications Prior to Admission medications   Medication Sig Start Date End Date Taking? Authorizing Provider  acetaminophen (TYLENOL) 500 MG tablet Take 1,000 mg by mouth 2 (two) times daily as needed for headache, fever, moderate pain or mild pain.    [provider]  albuterol (PROVENTIL) (2.5 MG/3ML) 0.083% nebulizer solution Take 3 mLs (2.5 mg total) by nebulization every 6 (six) hours as needed for wheezing or shortness of breath. 07/17/22   Oretha Milch, MD  albuterol (VENTOLIN HFA) 108 (90 Base) MCG/ACT inhaler INHALE 1-2 PUFFS BY MOUTH EVERY 6 HOURS AS NEEDED FOR WHEEZE OR SHORTNESS OF BREATH 09/26/23   Oretha Milch, MD  amLODipine (NORVASC) 5 MG tablet TAKE 1 TABLET (5 MG TOTAL) BY MOUTH DAILY. 06/02/23   Jake Bathe, MD  atorvastatin (LIPITOR) 40 MG tablet Take 1 tablet (40 mg total) by mouth daily. 04/11/23   Corwin Levins, MD  bisoprolol (ZEBETA) 5 MG tablet TAKE 1 TABLET (5 MG TOTAL) BY MOUTH DAILY. 09/05/23   Jake Bathe, MD  Cholecalciferol (VITAMIN D-3 PO) Take 1 capsule by mouth in the morning.    [provider]  Cyanocobalamin (VITAMIN B-12 PO) Take 1 tablet by mouth in the morning.    [provider]  Dulaglutide (TRULICITY) 1.5 MG/0.5ML SOAJ Inject 1.5 mg into the skin once a week. 11/17/23   Corwin Levins, MD  Dupilumab (DUPIXENT) 300 MG/2ML SOAJ Inject 300 mg into the skin every 14 (fourteen) days.  11/19/23   Oretha Milch, MD  fluticasone (FLONASE) 50 MCG/ACT nasal spray Place 1 spray into both nostrils daily. 10/22/23   Tomi Bamberger, PA-C  Fluticasone-Umeclidin-Vilant (TRELEGY ELLIPTA) 200-62.5-25 MCG/ACT AEPB INHALE 1 PUFF BY MOUTH EVERY DAY 07/05/23   Oretha Milch, MD  furosemide (LASIX) 20 MG tablet Take 20 mg by mouth 3 (three) times a week.    [provider]  ipratropium-albuterol (DUONEB) 0.5-2.5 (3) MG/3ML SOLN INHALE 3 ML BY NEBULIZER EVERY 6 HOURS AS NEEDED 09/19/23   Oretha Milch, MD  isosorbide mononitrate (IMDUR) 60 MG 24 hr tablet TAKE 1.5 TABLETS (90 MG TOTAL) BY MOUTH DAILY. 07/07/23   Jake Bathe, MD  KLOR-CON M10 10 MEQ tablet TAKE 1 TABLET BY MOUTH EVERY DAY Patient taking differently: Take 10 mEq by mouth 3 (three) times a week. 09/25/23   Corwin Levins, MD  MAGNESIUM OXIDE PO Take 1 tablet by mouth at bedtime.    [provider]  metFORMIN (GLUCOPHAGE-XR) 500 MG 24 hr tablet TAKE 2 TABLET BY MOUTH EVERY DAY WITH BREAKFAST 08/31/23   Corwin Levins, MD  Multiple Vitamins-Minerals (MULTIVITAMIN WOMEN 50+) TABS Take 1 tablet by mouth in the morning.    [provider]  predniSONE (DELTASONE) 10 MG tablet Take 1-2 tablets (10-20 mg total) by mouth 2 (two) times daily as needed. 10/13/23   Oretha Milch,  MD      Allergies    Augmentin [amoxicillin-pot clavulanate] and Chantix [varenicline]    Review of Systems   Review of Systems  Physical Exam Updated Vital Signs There were no vitals taken for this visit. Physical Exam Constitutional:      General: She is not in acute distress.    Appearance: She is obese.  HENT:     Head: Normocephalic and atraumatic.  Eyes:     Conjunctiva/sclera: Conjunctivae normal.     Pupils: Pupils are equal, round, and reactive to light.  Cardiovascular:     Rate and Rhythm: Regular rhythm. Tachycardia present.  Pulmonary:     Effort: Pulmonary effort is normal. No respiratory distress.      Comments: Poor air movement bilaterally, expiratory wheezing, patient speaking in short sentences Skin:    General: Skin is warm and dry.  Neurological:     General: No focal deficit present.     Mental Status: She is alert. Mental status is at baseline.  Psychiatric:        Mood and Affect: Mood normal.        Behavior: Behavior normal.     ED Results / Procedures / Treatments   Labs (all labs ordered are listed, but only abnormal results are displayed) Labs Reviewed  RESP PANEL BY RT-PCR (RSV, FLU A&B, COVID)  [id number]  BASIC METABOLIC PANEL  CBC WITH DIFFERENTIAL/PLATELET  BRAIN NATRIURETIC PEPTIDE  I-STAT VENOUS BLOOD GAS, ED    EKG None  Radiology No results found.  Procedures .Critical Care  Performed by: Terald Sleeper, MD Authorized by: Terald Sleeper, MD   Critical care provider statement:    Critical care time (minutes):  30   Critical care time was exclusive of:  Separately billable procedures and treating other patients   Critical care was necessary to treat or prevent imminent or life-threatening deterioration of the following conditions:  Respiratory failure   Critical care was time spent personally by me on the following activities:  Ordering and performing treatments and interventions, ordering and review of laboratory studies, ordering and review of radiographic studies, pulse oximetry, review of old charts, examination of patient and evaluation of patient's response to treatment Comments:     BiPAP for COPD exacerbation   {Document cardiac monitor, telemetry assessment procedure when appropriate:1}  Medications Ordered in ED Medications  albuterol (PROVENTIL) (2.5 MG/3ML) 0.083% nebulizer solution (has no administration in time range)  midazolam (VERSED) injection 2 mg (has no administration in time range)    ED Course/ Medical Decision Making/ A&P   {   Click here for ABCD2, HEART and other calculatorsREFRESH Note before signing :1}                               Medical Decision Making Amount and/or Complexity of Data Reviewed Labs: ordered. Radiology: ordered.  Risk Prescription drug management.   This patient presents to the ED with concern for shortness of breath, respiratory distress. This involves an extensive number of treatment options, and is a complaint that carries with it a high risk of complications and morbidity.  The differential diagnosis includes COPD exacerbation was pleural effusion versus viral URI versus other  Co-morbidities that complicate the patient evaluation: history of COPD  Additional history obtained from EMS  External records from outside source obtained and reviewed including evaluation pulmonology office in December 2000 24th concern for COPD on daily prednisone maintenance,  fluid retention, weight gain,  I ordered and personally interpreted labs.  The pertinent results include:  ***  I ordered imaging studies including x-ray of the chest I independently visualized and interpreted imaging which showed *** I agree with the radiologist interpretation  The patient was maintained on a cardiac monitor.  I personally viewed and interpreted the cardiac monitored which showed an underlying rhythm of: ***  Per my interpretation the patient's ECG shows ***  I ordered medication including ***  for *** I have reviewed the patients home medicines and have made adjustments as needed  Test Considered: ***  I requested consultation with the ***,  and discussed lab and imaging findings as well as pertinent plan - they recommend: ***  After the interventions noted above, I reevaluated the patient and found that they have: {resolved/improved/worsened:23923::"improved"}  Social Determinants of Health:***  Dispostion:  After consideration of the diagnostic results and the patients response to treatment, I feel that the patent would benefit from ***.   {Document critical care time when  appropriate:1} {Document review of labs and clinical decision tools ie heart score, Chads2Vasc2 etc:1}  {Document your independent review of radiology images, and any outside records:1} {Document your discussion with family members, caretakers, and with consultants:1} {Document social determinants of health affecting pt's care:1} {Document your decision making why or why not admission, treatments were needed:1} Final Clinical Impression(s) / ED Diagnoses Final diagnoses:  None    Rx / DC Orders ED Discharge Orders     None

## 2023-12-07 NOTE — ED Triage Notes (Signed)
Pt BIB EMS from home. C/o resp distress, copd exacerbation that started Friday pt states when she "had a couple drinks and cigarettes." EMS reports pt wore cpap at home today, but was unable to tolerate with ems.   Pt appears anxious on arrival, pt shouting she cannot breathe. EDP and RT at bedside  EMS admin 2G magnesium, solumedrol, and duoneb x2

## 2023-12-07 NOTE — H&P (Signed)
History and Physical    [name] UJW:[id number] DOB: [birth year] DOA: 12/07/2023  PCP: Corwin Levins, MD  Patient coming from: Home  Chief Complaint: Shortness of breath  HPI: [name] is a 68 y.o. female with medical history significant of COPD on chronic prednisone, chronic hypoxemic hypercapnic respiratory failure on home oxygen, CAD status post CABG, chronic HFpEF, tobacco abuse, hyperlipidemia, hypertension, type 2 diabetes, allergic rhinitis, GERD, obesity presented to ED via EMS from home for evaluation of respiratory distress.  She was given IV mag 2 g, Solu-Medrol, and DuoNeb x 2.  Brought into the ED on a nonrebreather and upon arrival placed on BiPAP as she continued to be in respiratory distress.  Tachycardic and tachypneic.  Afebrile.  Labs notable for WBC count 21.7, bicarb 33, glucose 339, BNP 133, COVID/influenza/RSV PCR negative, lactic acid 2.0, blood cultures pending.  VBG showing pH 7.24 and pCO2 86.0.  Chest x-ray showing minimal ill-defined patchy opacity at the right lung base which may represent atelectasis or pneumonia. Patient was given continuous albuterol neb treatment, Versed, ceftriaxone, doxycycline, and 2 L normal saline.  TRH called to admit.  Patient is reporting 2-day history of worsening shortness of breath, cough, and wheezing.  Symptoms started after she smoked cigarettes and had some alcoholic beverages with friends the night before.  Denies regular tobacco or alcohol use.  She has been using Trelegy, albuterol nebulizer, and taking Mucinex without much improvement.  Her chest hurts only when she coughs but not otherwise.  She is on 3 L home oxygen 24/7.  Also on chronic prednisone 10 mg daily and receives Dupixent injections every 2 weeks.  Patient states she was recently started on Dupixent and has received 2 injections so far.  Denies fevers or chills.  Denies history of blood clots.  She reports significant improvement of her dyspnea after being  placed on BiPAP.  Does complain of headache and facial discomfort since after being placed on BiPAP and requesting a pain medication.  Review of Systems:  Review of Systems  All other systems reviewed and are negative.   Past Medical History:  Diagnosis Date   Bronchitis    CAD (coronary artery disease)    a. 02/2002: CABG x3 with LIMA to LAD, RIMA to RCA, and SVG to OM   CHF (congestive heart failure) (HCC)    COPD (chronic obstructive pulmonary disease) (HCC)    Diabetes (HCC)    Headache    Heart disease    Hyperlipidemia    Hypertension    Hypertensive emergency 07/01/2013   Impaired glucose tolerance 08/20/2014   Sinusitis     Past Surgical History:  Procedure Laterality Date   CORONARY ARTERY BYPASS GRAFT  2003   Triple bypass   ESOPHAGOGASTRODUODENOSCOPY Left 06/13/2014   Procedure: ESOPHAGOGASTRODUODENOSCOPY (EGD);  Surgeon: Charna Elizabeth, MD;  Location: [facility] ENDOSCOPY;  Service: Endoscopy;  Laterality: Left;   RIGID ESOPHAGOSCOPY N/A 06/12/2014   Procedure: RIGID ESOPHAGOSCOPY WITH FOREIGN BODY REMOVAL;  Surgeon: Darletta Moll, MD;  Location: [facility] OR;  Service: ENT;  Laterality: N/A;     reports that she has been smoking cigarettes. She started smoking about 51 years ago. She has a 23.7 pack-year smoking history. She has never used smokeless tobacco. She reports current alcohol use of about 5.0 - 6.0 standard drinks of alcohol per week. She reports that she does not currently use drugs after having used the following drugs: Marijuana.  Allergies  Allergen Reactions   Augmentin [Amoxicillin-Pot  Clavulanate] Nausea And Vomiting   Chantix [Varenicline] Nausea And Vomiting    Family History  Problem Relation Age of Onset   Lung cancer Paternal Uncle    Heart disease Mother    Brain cancer Mother    Cancer Maternal Grandmother        colon   Stroke Other    Cancer Other        x 4 aunts   Lung cancer Maternal Aunt    Suicidality Brother     Prior to Admission  medications   Medication Sig Start Date End Date Taking? Authorizing Provider  acetaminophen (TYLENOL) 500 MG tablet Take 1,000 mg by mouth 2 (two) times daily as needed for headache, fever, moderate pain or mild pain.    [provider]  albuterol (PROVENTIL) (2.5 MG/3ML) 0.083% nebulizer solution Take 3 mLs (2.5 mg total) by nebulization every 6 (six) hours as needed for wheezing or shortness of breath. 07/17/22   Oretha Milch, MD  albuterol (VENTOLIN HFA) 108 (90 Base) MCG/ACT inhaler INHALE 1-2 PUFFS BY MOUTH EVERY 6 HOURS AS NEEDED FOR WHEEZE OR SHORTNESS OF BREATH 09/26/23   Oretha Milch, MD  amLODipine (NORVASC) 5 MG tablet TAKE 1 TABLET (5 MG TOTAL) BY MOUTH DAILY. 06/02/23   Jake Bathe, MD  atorvastatin (LIPITOR) 40 MG tablet Take 1 tablet (40 mg total) by mouth daily. 04/11/23   Corwin Levins, MD  bisoprolol (ZEBETA) 5 MG tablet TAKE 1 TABLET (5 MG TOTAL) BY MOUTH DAILY. 09/05/23   Jake Bathe, MD  Cholecalciferol (VITAMIN D-3 PO) Take 1 capsule by mouth in the morning.    [provider]  Cyanocobalamin (VITAMIN B-12 PO) Take 1 tablet by mouth in the morning.    [provider]  Dulaglutide (TRULICITY) 1.5 MG/0.5ML SOAJ Inject 1.5 mg into the skin once a week. 11/17/23   Corwin Levins, MD  Dupilumab (DUPIXENT) 300 MG/2ML SOAJ Inject 300 mg into the skin every 14 (fourteen) days. 11/19/23   Oretha Milch, MD  fluticasone (FLONASE) 50 MCG/ACT nasal spray Place 1 spray into both nostrils daily. 10/22/23   Tomi Bamberger, PA-C  Fluticasone-Umeclidin-Vilant (TRELEGY ELLIPTA) 200-62.5-25 MCG/ACT AEPB INHALE 1 PUFF BY MOUTH EVERY DAY 07/05/23   Oretha Milch, MD  furosemide (LASIX) 20 MG tablet Take 20 mg by mouth 3 (three) times a week.    [provider]  ipratropium-albuterol (DUONEB) 0.5-2.5 (3) MG/3ML SOLN INHALE 3 ML BY NEBULIZER EVERY 6 HOURS AS NEEDED 09/19/23   Oretha Milch, MD  isosorbide mononitrate (IMDUR) 60 MG 24 hr tablet TAKE 1.5 TABLETS  (90 MG TOTAL) BY MOUTH DAILY. 07/07/23   Jake Bathe, MD  KLOR-CON M10 10 MEQ tablet TAKE 1 TABLET BY MOUTH EVERY DAY Patient taking differently: Take 10 mEq by mouth 3 (three) times a week. 09/25/23   Corwin Levins, MD  MAGNESIUM OXIDE PO Take 1 tablet by mouth at bedtime.    [provider]  metFORMIN (GLUCOPHAGE-XR) 500 MG 24 hr tablet TAKE 2 TABLET BY MOUTH EVERY DAY WITH BREAKFAST 08/31/23   Corwin Levins, MD  Multiple Vitamins-Minerals (MULTIVITAMIN WOMEN 50+) TABS Take 1 tablet by mouth in the morning.    [provider]  predniSONE (DELTASONE) 10 MG tablet Take 1-2 tablets (10-20 mg total) by mouth 2 (two) times daily as needed. 10/13/23   Oretha Milch, MD    Physical Exam: Vitals:   12/07/23 2230 12/07/23 2241 12/07/23  2244 12/07/23 2245  BP: (!) 159/68   139/68  Pulse: (!) 143   (!) 143  Resp: (!) 32   (!) 32  Temp:   98.4 F (36.9 C)   TempSrc:   Oral   SpO2: 97% 97%  99%  Weight:   86.2 kg   Height:   5\' 1"  (1.549 m)     Physical Exam Vitals reviewed.  Constitutional:      General: She is not in acute distress. HENT:     Head: Normocephalic and atraumatic.  Eyes:     Extraocular Movements: Extraocular movements intact.  Cardiovascular:     Rate and Rhythm: Regular rhythm. Tachycardia present.     Pulses: Normal pulses.  Pulmonary:     Effort: Pulmonary effort is normal. No respiratory distress.     Breath sounds: No wheezing or rales.     Comments: Slightly diminished breath sounds bilaterally Abdominal:     General: Bowel sounds are normal. There is no distension.     Palpations: Abdomen is soft.     Tenderness: There is no abdominal tenderness. There is no guarding.  Musculoskeletal:     Cervical back: Normal range of motion.     Right lower leg: No edema.     Left lower leg: No edema.  Skin:    General: Skin is warm and dry.  Neurological:     General: No focal deficit present.     Mental Status: She is alert and oriented to  person, place, and time.     Labs on Admission: I have personally reviewed following labs and imaging studies  CBC: Recent Labs  Lab 12/07/23 2228 12/07/23 2234  WBC 21.7*  --   NEUTROABS 15.3*  --   HGB 14.8 16.3*  HCT 47.6* 48.0*  MCV 89.3  --   PLT 278  --    Basic Metabolic Panel: Recent Labs  Lab 12/07/23 2228 12/07/23 2234  NA 136 137  K 4.0 4.1  CL 95*  --   CO2 33*  --   GLUCOSE 339*  --   BUN 13  --   CREATININE 0.57  --   CALCIUM 8.9  --    GFR: Estimated Creatinine Clearance: 67.2 mL/min (by C-G formula based on SCr of 0.57 mg/dL). Liver Function Tests: No results for input(s): "AST", "ALT", "ALKPHOS", "BILITOT", "PROT", "ALBUMIN" in the last 168 hours. No results for input(s): "LIPASE", "AMYLASE" in the last 168 hours. No results for input(s): "AMMONIA" in the last 168 hours. Coagulation Profile: No results for input(s): "INR", "PROTIME" in the last 168 hours. Cardiac Enzymes: No results for input(s): "CKTOTAL", "CKMB", "CKMBINDEX", "TROPONINI" in the last 168 hours. BNP (last 3 results) No results for input(s): "PROBNP" in the last 8760 hours. HbA1C: No results for input(s): "HGBA1C" in the last 72 hours. CBG: No results for input(s): "GLUCAP" in the last 168 hours. Lipid Profile: No results for input(s): "CHOL", "HDL", "LDLCALC", "TRIG", "CHOLHDL", "LDLDIRECT" in the last 72 hours. Thyroid Function Tests: No results for input(s): "TSH", "T4TOTAL", "FREET4", "T3FREE", "THYROIDAB" in the last 72 hours. Anemia Panel: No results for input(s): "VITAMINB12", "FOLATE", "FERRITIN", "TIBC", "IRON", "RETICCTPCT" in the last 72 hours. Urine analysis:    Component Value Date/Time   COLORURINE YELLOW 02/27/2023 0620   APPEARANCEUR HAZY (A) 02/27/2023 0620   LABSPEC 1.023 02/27/2023 0620   PHURINE 6.0 02/27/2023 0620   GLUCOSEU NEGATIVE 02/27/2023 0620   GLUCOSEU NEGATIVE 02/17/2023 1557   HGBUR NEGATIVE 02/27/2023 1610  BILIRUBINUR NEGATIVE 02/27/2023  0620   BILIRUBINUR negative 02/24/2023 0924   KETONESUR NEGATIVE 02/27/2023 0620   PROTEINUR 30 (A) 02/27/2023 0620   UROBILINOGEN 0.2 02/24/2023 0924   UROBILINOGEN 0.2 02/17/2023 1557   NITRITE NEGATIVE 02/27/2023 0620   LEUKOCYTESUR NEGATIVE 02/27/2023 0620    Radiological Exams on Admission: DG Chest Port 1 View Result Date: 12/07/2023 CLINICAL DATA:  Shortness of breath. EXAM: PORTABLE CHEST 1 VIEW COMPARISON:  02/24/2023 FINDINGS: Prior median sternotomy and CABG.The cardiomediastinal contours are stable, heart size upper normal. Subsegmental atelectasis or scarring in the left mid lung. Minimal ill-defined patchy opacity at the right lung base. No pleural fluid or pneumothorax. No acute osseous abnormalities are seen. IMPRESSION: 1. Minimal ill-defined patchy opacity at the right lung base, may represent atelectasis or pneumonia. 2. Subsegmental atelectasis or scarring in the left mid lung. Electronically Signed   By: Narda Rutherford M.D.   On: 12/07/2023 22:43    EKG: Independently reviewed.  Sinus tachycardia, no STEMI.  Assessment and Plan  Acute on chronic hypoxemic hypercapnic respiratory failure secondary to acute COPD exacerbation Possible community-acquired pneumonia Sepsis Patient is presenting with 2-day history of worsening dyspnea, cough, and wheezing.  She was given IV mag 2 g, Solu-Medrol, and DuoNeb x 2 by EMS.  Placed on BiPAP on arrival to the ED due to respiratory distress and required additional continuous albuterol neb treatment.  VBG with pH 7.24 and pCO2 86.0.  Chest x-ray showing minimal ill-defined patchy opacity at the right lung base which may represent atelectasis or pneumonia. WBC count 21.7, lactate 2.0.  Not hypotensive.  COVID/influenza/RSV PCR negative.  Work of breathing has significantly improved with BiPAP and appears comfortable.  Currently speaking clearly in full sentences. Continues to be tachycardic after beta agonist treatments.   Plan: Continue  treatment with antibiotics, IV steroids, scheduled bronchodilators, mucolytics.  Patient was given 2 L IV fluids in the ED, repeat lactate ordered.  Continue to trend WBC count.  Follow-up blood cultures.  Continue BiPAP and repeat blood gas in a few hours.  Urgent D-dimer ordered, if elevated, CTA chest to rule out PE.  CAD status post CABG Stable, not endorsing any anginal symptoms.  EKG without acute ischemic changes.  Chronic HFpEF Last echo done in August 2023 showing EF 55 to 60%, grade 2 diastolic dysfunction, trivial MR. BNP 133 and does not appear volume overloaded on exam.  Non-insulin-dependent type 2 diabetes Glucose 339 in the setting of steroid use.  No signs of DKA.  Last A1c 7.7 in November 2024.  Placed on sliding scale insulin every 4 hours for now since patient is on BiPAP.  Hyperlipidemia Hypertension GERD Pharmacy med rec pending.  DVT prophylaxis: Lovenox Code Status: Full Code (discussed with the patient) Family Communication: No family available at this time. Level of care: Progressive Care Unit Admission status: It is my clinical opinion that admission to INPATIENT is reasonable and necessary because of the expectation that this patient will require hospital care that crosses at least 2 midnights to treat this condition based on the medical complexity of the problems presented.  Given the aforementioned information, the predictability of an adverse outcome is felt to be significant.  John Giovanni MD [facility]  If 7PM-7AM, please contact night-coverage [email or web address]  12/07/2023, 11:42 PM

## 2023-12-08 ENCOUNTER — Encounter (HOSPITAL_COMMUNITY): Payer: Self-pay | Admitting: Internal Medicine

## 2023-12-08 ENCOUNTER — Inpatient Hospital Stay (HOSPITAL_COMMUNITY): Payer: Medicare Other

## 2023-12-08 DIAGNOSIS — E119 Type 2 diabetes mellitus without complications: Secondary | ICD-10-CM | POA: Diagnosis not present

## 2023-12-08 DIAGNOSIS — J9622 Acute and chronic respiratory failure with hypercapnia: Secondary | ICD-10-CM

## 2023-12-08 DIAGNOSIS — I509 Heart failure, unspecified: Secondary | ICD-10-CM

## 2023-12-08 DIAGNOSIS — I10 Essential (primary) hypertension: Secondary | ICD-10-CM

## 2023-12-08 DIAGNOSIS — E785 Hyperlipidemia, unspecified: Secondary | ICD-10-CM

## 2023-12-08 DIAGNOSIS — J441 Chronic obstructive pulmonary disease with (acute) exacerbation: Secondary | ICD-10-CM | POA: Diagnosis not present

## 2023-12-08 DIAGNOSIS — E872 Acidosis, unspecified: Secondary | ICD-10-CM | POA: Diagnosis not present

## 2023-12-08 DIAGNOSIS — J9621 Acute and chronic respiratory failure with hypoxia: Principal | ICD-10-CM

## 2023-12-08 LAB — CBC
HCT: 43.1 % (ref 36.0–46.0)
Hemoglobin: 13.3 g/dL (ref 12.0–15.0)
MCH: 27.9 pg (ref 26.0–34.0)
MCHC: 30.9 g/dL (ref 30.0–36.0)
MCV: 90.4 fL (ref 80.0–100.0)
Platelets: 209 10*3/uL (ref 150–400)
RBC: 4.77 MIL/uL (ref 3.87–5.11)
RDW: 13.7 % (ref 11.5–15.5)
WBC: 14.6 10*3/uL — ABNORMAL HIGH (ref 4.0–10.5)
nRBC: 0 % (ref 0.0–0.2)

## 2023-12-08 LAB — RESPIRATORY PANEL BY PCR

## 2023-12-08 LAB — BASIC METABOLIC PANEL
Anion gap: 12 (ref 5–15)
BUN: 11 mg/dL (ref 8–23)
CO2: 27 mmol/L (ref 22–32)
Calcium: 8.1 mg/dL — ABNORMAL LOW (ref 8.9–10.3)
Chloride: 100 mmol/L (ref 98–111)
Creatinine, Ser: 0.59 mg/dL (ref 0.44–1.00)
GFR, Estimated: >60 mL/min (ref >60)
Glucose, Bld: 340 mg/dL — ABNORMAL HIGH (ref 70–99)
Potassium: 4.4 mmol/L (ref 3.5–5.1)
Sodium: 139 mmol/L (ref 135–145)

## 2023-12-08 LAB — I-STAT CG4 LACTIC ACID, ED: Lactic Acid, Venous: 2.5 mmol/L (ref 0.5–1.9)

## 2023-12-08 LAB — BLOOD GAS, VENOUS
Acid-Base Excess: 1.2 mmol/L (ref 0.0–2.0)
Bicarbonate: 30.3 mmol/L — ABNORMAL HIGH (ref 20.0–28.0)
O2 Saturation: 95.5 %
Patient temperature: 36.7
pCO2, Ven: 68 mm[Hg] — ABNORMAL HIGH (ref 44–60)
pH, Ven: 7.25 (ref 7.25–7.43)
pO2, Ven: 69 mm[Hg] — ABNORMAL HIGH (ref 32–45)

## 2023-12-08 LAB — CBG MONITORING, ED
Glucose-Capillary: 318 mg/dL — ABNORMAL HIGH (ref 70–99)
Glucose-Capillary: 287 mg/dL — ABNORMAL HIGH (ref 70–99)
Glucose-Capillary: 257 mg/dL — ABNORMAL HIGH (ref 70–99)
Glucose-Capillary: 314 mg/dL — ABNORMAL HIGH (ref 70–99)
Glucose-Capillary: 294 mg/dL — ABNORMAL HIGH (ref 70–99)
Glucose-Capillary: 278 mg/dL — ABNORMAL HIGH (ref 70–99)

## 2023-12-08 LAB — HIV ANTIBODY (ROUTINE TESTING W REFLEX): HIV Screen 4th Generation wRfx: NONREACTIVE

## 2023-12-08 LAB — D-DIMER, QUANTITATIVE: D-Dimer, Quant: 0.91 ug{FEU}/mL — ABNORMAL HIGH (ref 0.00–0.50)

## 2023-12-08 LAB — LACTIC ACID, PLASMA: Lactic Acid, Venous: 2.2 mmol/L (ref 0.5–1.9)

## 2023-12-08 LAB — PROCALCITONIN: Procalcitonin: <0.1 ng/mL

## 2023-12-08 LAB — GLUCOSE, CAPILLARY: Glucose-Capillary: 277 mg/dL — ABNORMAL HIGH (ref 70–99)

## 2023-12-08 MED ORDER — ACETAMINOPHEN 650 MG RE SUPP: 650.0000 mg | Freq: Four times a day (QID) | RECTAL | Status: DC | PRN

## 2023-12-08 MED ORDER — ISOSORBIDE MONONITRATE ER 60 MG PO TB24
90.0000 mg | ORAL_TABLET | Freq: Every day | ORAL | Status: DC
  Administered 2023-12-08 – 2023-12-13 (×6): 90 mg via ORAL
  Filled 2023-12-08 (×3): qty 1
  Filled 2023-12-08: qty 3
  Filled 2023-12-08 (×2): qty 1

## 2023-12-08 MED ORDER — LEVALBUTEROL HCL 0.63 MG/3ML IN NEBU: 0.6300 mg | INHALATION_SOLUTION | Freq: Four times a day (QID) | RESPIRATORY_TRACT | Status: DC

## 2023-12-08 MED ORDER — ENOXAPARIN SODIUM 40 MG/0.4ML IJ SOSY
40.0000 mg | PREFILLED_SYRINGE | INTRAMUSCULAR | Status: DC
  Administered 2023-12-08 – 2023-12-13 (×6): 40 mg via SUBCUTANEOUS
  Filled 2023-12-08 (×6): qty 0.4

## 2023-12-08 MED ORDER — LACTATED RINGERS IV SOLN: INTRAVENOUS | Status: DC

## 2023-12-08 MED ORDER — INSULIN ASPART 100 UNIT/ML IJ SOLN
0.0000 [IU] | Freq: Three times a day (TID) | INTRAMUSCULAR | Status: DC
  Administered 2023-12-08: 11 [IU] via SUBCUTANEOUS
  Administered 2023-12-09: 20 [IU] via SUBCUTANEOUS
  Administered 2023-12-09: 3 [IU] via SUBCUTANEOUS
  Administered 2023-12-09 – 2023-12-10 (×2): 7 [IU] via SUBCUTANEOUS
  Administered 2023-12-10: 17 [IU] via SUBCUTANEOUS
  Administered 2023-12-10: 11 [IU] via SUBCUTANEOUS
  Administered 2023-12-11 (×2): 7 [IU] via SUBCUTANEOUS
  Administered 2023-12-12: 3 [IU] via SUBCUTANEOUS
  Administered 2023-12-12: 20 [IU] via SUBCUTANEOUS
  Administered 2023-12-12: 4 [IU] via SUBCUTANEOUS

## 2023-12-08 MED ORDER — ACETAMINOPHEN 325 MG PO TABS
650.0000 mg | ORAL_TABLET | Freq: Four times a day (QID) | ORAL | Status: DC | PRN
  Administered 2023-12-08 – 2023-12-10 (×3): 650 mg via ORAL
  Filled 2023-12-08 (×3): qty 2

## 2023-12-08 MED ORDER — BISOPROLOL FUMARATE 5 MG PO TABS
5.0000 mg | ORAL_TABLET | Freq: Every day | ORAL | Status: DC
  Administered 2023-12-08 – 2023-12-13 (×6): 5 mg via ORAL
  Filled 2023-12-08 (×6): qty 1

## 2023-12-08 MED ORDER — INSULIN GLARGINE-YFGN 100 UNIT/ML ~~LOC~~ SOLN
10.0000 [IU] | Freq: Two times a day (BID) | SUBCUTANEOUS | Status: DC
  Administered 2023-12-08 – 2023-12-13 (×10): 10 [IU] via SUBCUTANEOUS
  Filled 2023-12-08 (×12): qty 0.1

## 2023-12-08 MED ORDER — GUAIFENESIN ER 600 MG PO TB12
600.0000 mg | ORAL_TABLET | Freq: Two times a day (BID) | ORAL | Status: DC
  Administered 2023-12-08 – 2023-12-13 (×9): 600 mg via ORAL
  Filled 2023-12-08 (×10): qty 1

## 2023-12-08 MED ORDER — METHYLPREDNISOLONE SODIUM SUCC 125 MG IJ SOLR
60.0000 mg | Freq: Two times a day (BID) | INTRAMUSCULAR | Status: DC
  Administered 2023-12-08 – 2023-12-10 (×5): 60 mg via INTRAVENOUS
  Filled 2023-12-08 (×5): qty 2

## 2023-12-08 MED ORDER — MAGNESIUM OXIDE -MG SUPPLEMENT 400 (240 MG) MG PO TABS
400.0000 mg | ORAL_TABLET | Freq: Every day | ORAL | Status: DC
  Administered 2023-12-08 – 2023-12-13 (×6): 400 mg via ORAL
  Filled 2023-12-08 (×6): qty 1

## 2023-12-08 MED ORDER — NALOXONE HCL 0.4 MG/ML IJ SOLN: 0.4000 mg | INTRAMUSCULAR | Status: DC | PRN

## 2023-12-08 MED ORDER — BUDESONIDE 0.25 MG/2ML IN SUSP
0.2500 mg | Freq: Two times a day (BID) | RESPIRATORY_TRACT | Status: DC
  Administered 2023-12-08 – 2023-12-13 (×10): 0.25 mg via RESPIRATORY_TRACT
  Filled 2023-12-08 (×10): qty 2

## 2023-12-08 MED ORDER — IPRATROPIUM BROMIDE 0.02 % IN SOLN
0.5000 mg | Freq: Four times a day (QID) | RESPIRATORY_TRACT | Status: DC
  Administered 2023-12-08 – 2023-12-11 (×12): 0.5 mg via RESPIRATORY_TRACT
  Filled 2023-12-08 (×11): qty 2.5

## 2023-12-08 MED ORDER — SODIUM CHLORIDE 0.9 % IV SOLN: 100.0000 mg | Freq: Two times a day (BID) | INTRAVENOUS | Status: DC

## 2023-12-08 MED ORDER — MORPHINE SULFATE (PF) 2 MG/ML IV SOLN
1.0000 mg | Freq: Once | INTRAVENOUS | Status: AC | PRN
  Administered 2023-12-08: 1 mg via INTRAVENOUS
  Filled 2023-12-08: qty 1

## 2023-12-08 MED ORDER — ATORVASTATIN CALCIUM 40 MG PO TABS
40.0000 mg | ORAL_TABLET | Freq: Every day | ORAL | Status: DC
  Administered 2023-12-08 – 2023-12-13 (×6): 40 mg via ORAL
  Filled 2023-12-08 (×6): qty 1

## 2023-12-08 MED ORDER — INSULIN ASPART 100 UNIT/ML IJ SOLN
0.0000 [IU] | INTRAMUSCULAR | Status: DC
  Administered 2023-12-08: 11 [IU] via SUBCUTANEOUS

## 2023-12-08 MED ORDER — IPRATROPIUM BROMIDE 0.02 % IN SOLN: 0.5000 mg | Freq: Four times a day (QID) | RESPIRATORY_TRACT | Status: DC

## 2023-12-08 MED ORDER — INSULIN ASPART 100 UNIT/ML IJ SOLN
0.0000 [IU] | INTRAMUSCULAR | Status: DC
  Administered 2023-12-08: 8 [IU] via SUBCUTANEOUS
  Administered 2023-12-08: 11 [IU] via SUBCUTANEOUS

## 2023-12-08 MED ORDER — SODIUM CHLORIDE 0.9 % IV SOLN
500.0000 mg | INTRAVENOUS | Status: DC
  Administered 2023-12-08: 500 mg via INTRAVENOUS
  Filled 2023-12-08 (×2): qty 5

## 2023-12-08 MED ORDER — IOHEXOL 350 MG/ML SOLN
50.0000 mL | Freq: Once | INTRAVENOUS | Status: AC | PRN
  Administered 2023-12-08: 50 mL via INTRAVENOUS

## 2023-12-08 MED ORDER — SODIUM CHLORIDE 0.9 % IV SOLN
1.0000 g | INTRAVENOUS | Status: DC
  Administered 2023-12-08: 1 g via INTRAVENOUS
  Filled 2023-12-08: qty 10

## 2023-12-08 MED ORDER — INSULIN ASPART 100 UNIT/ML IJ SOLN
6.0000 [IU] | Freq: Three times a day (TID) | INTRAMUSCULAR | Status: DC
  Administered 2023-12-08 – 2023-12-13 (×14): 6 [IU] via SUBCUTANEOUS

## 2023-12-08 MED ORDER — LEVALBUTEROL HCL 0.63 MG/3ML IN NEBU
0.6300 mg | INHALATION_SOLUTION | Freq: Four times a day (QID) | RESPIRATORY_TRACT | Status: DC
  Administered 2023-12-08 – 2023-12-11 (×12): 0.63 mg via RESPIRATORY_TRACT
  Filled 2023-12-08 (×11): qty 3

## 2023-12-08 MED ORDER — INSULIN ASPART 100 UNIT/ML IJ SOLN
0.0000 [IU] | Freq: Every day | INTRAMUSCULAR | Status: DC
  Administered 2023-12-08: 3 [IU] via SUBCUTANEOUS
  Administered 2023-12-10: 4 [IU] via SUBCUTANEOUS
  Administered 2023-12-11 – 2023-12-12 (×2): 2 [IU] via SUBCUTANEOUS

## 2023-12-08 MED ORDER — MORPHINE SULFATE (PF) 2 MG/ML IV SOLN
1.0000 mg | Freq: Once | INTRAVENOUS | Status: AC
  Administered 2023-12-08: 1 mg via INTRAVENOUS
  Filled 2023-12-08: qty 1

## 2023-12-08 MED ORDER — SODIUM CHLORIDE 0.9 % IV BOLUS
500.0000 mL | Freq: Once | INTRAVENOUS | Status: AC
  Administered 2023-12-08: 500 mL via INTRAVENOUS

## 2023-12-08 NOTE — Progress Notes (Signed)
RT placed pt back on BiPAP per pt request to take a nap.

## 2023-12-08 NOTE — Progress Notes (Signed)
Progress Note   Patient: [name] VHQ:[id number] DOB: [birth year] DOA: 12/07/2023     1 DOS: the patient was seen and examined on 12/08/2023   Brief hospital course: Taken from H&P   [name] is a 68 y.o. female with medical history significant of COPD on chronic prednisone, chronic hypoxemic hypercapnic respiratory failure on 3L of oxygen, CAD status post CABG, chronic HFpEF, tobacco abuse, hyperlipidemia, hypertension, type 2 diabetes, allergic rhinitis, GERD, obesity presented to ED via EMS from home for evaluation of respiratory distress.   She was brought in to ED on nonrebreather and upon arrival was placed on BiPAP due to persistent respiratory distress.  Labs pertinent for leukocytosis at 21.7, bicarb 33 glucose 339, BNP 133,COVID/influenza/RSV PCR negative, lactic acid 2.0,VBG showing pH 7.24 and pCO2 86.0. Chest x-ray showing minimal ill-defined patchy opacity at the right lung base which may represent atelectasis or pneumonia.  Patient was given continuous albuterol neb treatment, Versed, ceftriaxone, doxycycline, and 2 L normal saline.  Also started on ceftriaxone and doxycycline for concern of pneumonia.  2/17: Hemodynamically stable, able to wean from BiPAP, currently saturating well on 7 L of oxygen with 3 L of baseline, improving leukocytosis at 14.6, pending cultures. CTA was negative for PE or other acute abnormalities.  RVP came back positive for coronavirus 43, procalcitonin negative.  Assessment and Plan: * Acute on chronic respiratory failure with hypoxia and hypercapnia (HCC) Secondary to COPD exacerbation with coronavirus 43 infection. Initially required BiPAP, now weaned back to nasal cannula but requiring up to 7 L of oxygen with baseline use of 3 L. RVP positive for coronavirus 43.  Procalcitonin negative. -Continue with current antibiotics-switching doxycycline with Zithromax for COPD exacerbation. -Continue with bronchodilator -Continue with  supplemental oxygen-wean back to baseline as tolerated  COPD with acute exacerbation (HCC) Chest still tight. -Continue with bronchodilator -Continue with Solu-Medrol -Continue with supportive care  Lactic acidosis Patient had persistent mild lactic acidosis, likely with hypoxia secondary to COPD exacerbation. -Giving some more IV fluid -Monitor lactic acid  Non-insulin dependent type 2 diabetes mellitus (HCC) CBG elevated, likely due to steroid use. -Added Semglee 10 units twice daily -Add mealtime coverage -Make SSI resistant  Essential hypertension Blood pressure currently within goal. Patient was on amlodipine, bisoprolol, Lasix and Imdur at home -Restarting home bisoprolol and Imdur -We can add Lasix and amlodipine if needed  CAD (coronary artery disease) S/p CABG. no concern of chest pain at this time. -Continue home bisoprolol and Imdur -Continue home statin  CHF (congestive heart failure) (HCC) Patient with history of HFpEF,Last echo done in August 2023 showing EF 55 to 60%, grade 2 diastolic dysfunction, trivial MR. BNP 133 and does not appear volume overloaded on exam.  -Holding home Lasix-we can resume if needed  Hyperlipidemia -Continue home statin   Subjective: Patient was still having cough and feeling short of breath but stating it is better than before.  Physical Exam: Vitals:   12/08/23 0830 12/08/23 1120 12/08/23 1130 12/08/23 1149  BP: (!) 128/56  (!) 117/92   Pulse: 85 75 82   Resp: (!) 26 (!) 21 (!) 22   Temp:    97.7 F (36.5 C)  TempSrc:      SpO2: 100% 100% 100%   Weight:      Height:       General.  Obese lady, in no acute distress. Pulmonary.  Lungs clear bilaterally, mildly decreased air entry, normal respiratory effort. CV.  Regular rate and rhythm, no  JVD, rub or murmur. Abdomen.  Soft, nontender, nondistended, BS positive. CNS.  Alert and oriented .  No focal neurologic deficit. Extremities.  No edema, no cyanosis, pulses intact  and symmetrical. Psychiatry.  Judgment and insight appears normal.   Data Reviewed: Prior data reviewed  Family Communication: Discussed with sister at bedside  Disposition: Status is: Inpatient Remains inpatient appropriate because: Severity of illness  Planned Discharge Destination: Home  DVT prophylaxis.  Lovenox Time spent: 50 minutes  This record has been created using Conservation officer, historic buildings. Errors have been sought and corrected,but may not always be located. Such creation errors do not reflect on the standard of care.   Author: Arnetha Courser, MD 12/08/2023 1:34 PM  For on call review [email or web address].

## 2023-12-08 NOTE — ED Notes (Signed)
Pt transported to CT on NRB, tolerated and maintained 98-100%. Upon arrival back to room, pt c/o feeling SHOB. RT at bedside to restart bipap.   RT contacted regarding nebulizer admin d/t pt being on bipap. Provider updated on repeat lactic, VS, and pain meds/relief.

## 2023-12-08 NOTE — Assessment & Plan Note (Signed)
 Continue home statin

## 2023-12-08 NOTE — ED Notes (Signed)
Trialed off of bipap and patient got tachypneic and felt she couldn't breathe as well.  Placed back on bipap

## 2023-12-08 NOTE — Assessment & Plan Note (Signed)
Patient with history of HFpEF,Last echo done in August 2023 showing EF 55 to 60%, grade 2 diastolic dysfunction, trivial MR. BNP 133 and does not appear volume overloaded on exam.  -Holding home Lasix-we can resume if needed

## 2023-12-08 NOTE — Assessment & Plan Note (Signed)
Blood pressure currently within goal. Patient was on amlodipine, bisoprolol, Lasix and Imdur at home -Restarting home bisoprolol and Imdur -We can add Lasix and amlodipine if needed

## 2023-12-08 NOTE — Inpatient Diabetes Management (Signed)
Inpatient Diabetes Program Recommendations  AACE/ADA: New Consensus Statement on Inpatient Glycemic Control (2015)  Target Ranges:  Prepandial:   less than 140 mg/dL      Peak postprandial:   less than 180 mg/dL (1-2 hours)      Critically ill patients:  140 - 180 mg/dL    Latest Reference Range & Units 08/29/23 11:48  Hemoglobin A1C 4.6 - 6.5 % 7.7 (H)  (H): Data is abnormally high  Latest Reference Range & Units 12/08/23 01:44 12/08/23 04:18 12/08/23 07:35  Glucose-Capillary 70 - 99 mg/dL 409 (H)  11 units Novolog  287 (H)  8 units Novolog  257 (H)  11 units Novolog  60 mg Solumedrol  (H): Data is abnormally high     Home DM Meds: Trulicity 1.5 mg Qweek        Metformin 1000 mg QAM  Current Orders: Novolog Resistant Correction Scale/ SSI (0-20 units) Q4 hours    MD- Note pt getting Solumedrol 60 mg BID  CBG 257 this AM  Please consider starting basal insulin while pt getting Steroids: Semglee 12 units daily (0.15 units/kg)    --Will follow patient during hospitalization--  Ambrose Finland RN, MSN, CDCES Diabetes Coordinator Inpatient Glycemic Control Team Team Pager: [phone] (8a-5p)

## 2023-12-08 NOTE — Assessment & Plan Note (Signed)
CBG elevated, likely due to steroid use. -Added Semglee 10 units twice daily -Add mealtime coverage -Make SSI resistant

## 2023-12-08 NOTE — Assessment & Plan Note (Addendum)
Secondary to COPD exacerbation with coronavirus 43 infection. Initially required BiPAP, now weaned back to nasal cannula but requiring up to 7 L of oxygen with baseline use of 3 L. RVP positive for coronavirus 43.  Procalcitonin negative. -Continue with current antibiotics-switching doxycycline with Zithromax for COPD exacerbation. -Continue with bronchodilator -Continue with supplemental oxygen-wean back to baseline as tolerated

## 2023-12-08 NOTE — Assessment & Plan Note (Signed)
Patient had persistent mild lactic acidosis, likely with hypoxia secondary to COPD exacerbation. -Giving some more IV fluid -Monitor lactic acid

## 2023-12-08 NOTE — Assessment & Plan Note (Signed)
Chest still tight. -Continue with bronchodilator -Continue with Solu-Medrol -Continue with supportive care

## 2023-12-08 NOTE — Progress Notes (Signed)
RT removed pt from BiPAP and placed pt on high flow (salter). Pt tolerating well at this time with SVS. BiPAP on stby at bedside.

## 2023-12-08 NOTE — Hospital Course (Addendum)
Taken from H&P   [name] is a 68 y.o. female with medical history significant of COPD on chronic prednisone, chronic hypoxemic hypercapnic respiratory failure on 3L of oxygen, CAD status post CABG, chronic HFpEF, tobacco abuse, hyperlipidemia, hypertension, type 2 diabetes, allergic rhinitis, GERD, obesity presented to ED via EMS from home for evaluation of respiratory distress.   She was brought in to ED on nonrebreather and upon arrival was placed on BiPAP due to persistent respiratory distress.  Labs pertinent for leukocytosis at 21.7, bicarb 33 glucose 339, BNP 133,COVID/influenza/RSV PCR negative, lactic acid 2.0,VBG showing pH 7.24 and pCO2 86.0. Chest x-ray showing minimal ill-defined patchy opacity at the right lung base which may represent atelectasis or pneumonia.  Patient was given continuous albuterol neb treatment, Versed, ceftriaxone, doxycycline, and 2 L normal saline.  Also started on ceftriaxone and doxycycline for concern of pneumonia.  2/17: Hemodynamically stable, able to wean from BiPAP, currently saturating well on 7 L of oxygen with 3 L of baseline, improving leukocytosis at 14.6, pending cultures. CTA was negative for PE or other acute abnormalities.  RVP came back positive for coronavirus 43, procalcitonin negative.

## 2023-12-08 NOTE — Assessment & Plan Note (Signed)
S/p CABG. no concern of chest pain at this time. -Continue home bisoprolol and Imdur -Continue home statin

## 2023-12-09 ENCOUNTER — Inpatient Hospital Stay (HOSPITAL_COMMUNITY): Payer: Medicare Other

## 2023-12-09 DIAGNOSIS — J9621 Acute and chronic respiratory failure with hypoxia: Secondary | ICD-10-CM | POA: Diagnosis not present

## 2023-12-09 DIAGNOSIS — E119 Type 2 diabetes mellitus without complications: Secondary | ICD-10-CM | POA: Diagnosis not present

## 2023-12-09 DIAGNOSIS — J441 Chronic obstructive pulmonary disease with (acute) exacerbation: Secondary | ICD-10-CM | POA: Diagnosis not present

## 2023-12-09 DIAGNOSIS — E872 Acidosis, unspecified: Secondary | ICD-10-CM | POA: Diagnosis not present

## 2023-12-09 LAB — BASIC METABOLIC PANEL
Anion gap: 7 (ref 5–15)
BUN: 10 mg/dL (ref 8–23)
CO2: 31 mmol/L (ref 22–32)
Calcium: 8.7 mg/dL — ABNORMAL LOW (ref 8.9–10.3)
Chloride: 102 mmol/L (ref 98–111)
Creatinine, Ser: 0.64 mg/dL (ref 0.44–1.00)
GFR, Estimated: >60 mL/min (ref >60)
Glucose, Bld: 266 mg/dL — ABNORMAL HIGH (ref 70–99)
Potassium: 4.9 mmol/L (ref 3.5–5.1)
Sodium: 140 mmol/L (ref 135–145)

## 2023-12-09 LAB — GLUCOSE, CAPILLARY
Glucose-Capillary: 110 mg/dL — ABNORMAL HIGH (ref 70–99)
Glucose-Capillary: 169 mg/dL — ABNORMAL HIGH (ref 70–99)
Glucose-Capillary: 221 mg/dL — ABNORMAL HIGH (ref 70–99)
Glucose-Capillary: 245 mg/dL — ABNORMAL HIGH (ref 70–99)
Glucose-Capillary: 257 mg/dL — ABNORMAL HIGH (ref 70–99)
Glucose-Capillary: 272 mg/dL — ABNORMAL HIGH (ref 70–99)
Glucose-Capillary: 325 mg/dL — ABNORMAL HIGH (ref 70–99)
Glucose-Capillary: 353 mg/dL — ABNORMAL HIGH (ref 70–99)
Glucose-Capillary: 94 mg/dL (ref 70–99)

## 2023-12-09 LAB — LACTIC ACID, PLASMA: Lactic Acid, Venous: 0.8 mmol/L (ref 0.5–1.9)

## 2023-12-09 LAB — CBC
HCT: 41.9 % (ref 36.0–46.0)
Hemoglobin: 12.9 g/dL (ref 12.0–15.0)
MCH: 28.1 pg (ref 26.0–34.0)
MCHC: 30.8 g/dL (ref 30.0–36.0)
MCV: 91.3 fL (ref 80.0–100.0)
Platelets: 212 10*3/uL (ref 150–400)
RBC: 4.59 MIL/uL (ref 3.87–5.11)
RDW: 14.6 % (ref 11.5–15.5)
WBC: 13.1 10*3/uL — ABNORMAL HIGH (ref 4.0–10.5)
nRBC: 0 % (ref 0.0–0.2)

## 2023-12-09 MED ORDER — FUROSEMIDE 10 MG/ML IJ SOLN
40.0000 mg | Freq: Once | INTRAMUSCULAR | Status: AC
  Administered 2023-12-09: 40 mg via INTRAVENOUS
  Filled 2023-12-09: qty 4

## 2023-12-09 MED ORDER — HYDROXYZINE HCL 25 MG PO TABS
25.0000 mg | ORAL_TABLET | Freq: Three times a day (TID) | ORAL | Status: DC | PRN
  Administered 2023-12-09 – 2023-12-13 (×6): 25 mg via ORAL
  Filled 2023-12-09 (×6): qty 1

## 2023-12-09 MED ORDER — FUROSEMIDE 10 MG/ML IJ SOLN: 20.0000 mg | Freq: Once | INTRAMUSCULAR | Status: DC

## 2023-12-09 MED ORDER — AZITHROMYCIN 500 MG PO TABS
500.0000 mg | ORAL_TABLET | Freq: Every day | ORAL | Status: AC
  Administered 2023-12-09 – 2023-12-10 (×2): 500 mg via ORAL
  Filled 2023-12-09 (×2): qty 1

## 2023-12-09 NOTE — Progress Notes (Signed)
   12/09/23 2006  BiPAP/CPAP/SIPAP  $ Non-Invasive Ventilator  Non-Invasive Vent Subsequent  BiPAP/CPAP/SIPAP Pt Type Adult  BiPAP/CPAP/SIPAP SERVO  Mask Type Full face mask  Mask Size Medium  Set Rate 14 breaths/min  Respiratory Rate 25 breaths/min  IPAP 10 cmH20  PEEP 5 cmH20  FiO2 (%) 30 %  Minute Ventilation 12.6  Leak 27  Peak Inspiratory Pressure (PIP) 18  Tidal Volume (Vt) 606  Patient Home Equipment No  Press High Alarm 35 cmH2O  Press Low Alarm 5 cmH2O  CPAP/SIPAP surface wiped down Yes  BiPAP/CPAP /SiPAP Vitals  Pulse Rate 95  Resp (!) 27  SpO2 93 %  Bilateral Breath Sounds Diminished  MEWS Score/Color  MEWS Score 2  MEWS Score Color Yellow

## 2023-12-09 NOTE — Progress Notes (Addendum)
   12/09/23 1200  Mobility  Activity Ambulated with assistance in hallway  Level of Assistance Contact guard assist, steadying assist  Assistive Device Other (Comment) (HHA and Handrails)  Distance Ambulated (ft) 50 ft  Activity Response Tolerated fair  Mobility Referral Yes  Mobility visit 1 Mobility  Mobility Specialist Start Time (ACUTE ONLY) 1220  Mobility Specialist Stop Time (ACUTE ONLY) 1240  Mobility Specialist Time Calculation (min) (ACUTE ONLY) 20 min   Mobility Specialist: Progress Note  Pre-Mobility:      HR 95,                           SpO2 95% 6L During Mobility:             BP 170/71 (94), SpO2 94-96% 6L Post-Mobility:    HR 80, BP 143/64 (85), SpO2 94% 6L Pt agreeable to mobility session - received sitting in bed. C/o SOB, dizziness/lightheadedness with ambulation. Required x3 standing breaks. Returned to chair with all needs met - call bell within reach. Chair alarm on. Son present. Pt with door open at BOS, MS closed door EOS.  Barnie Mort, BS Mobility Specialist Please contact via SecureChat or  Rehab office at [phone].

## 2023-12-09 NOTE — Progress Notes (Signed)
PHARMACIST - PHYSICIAN COMMUNICATION Physician Name: Dr. Windell Norfolk   CONCERNING: Antibiotic IV to Oral Route Change Policy  RECOMMENDATION: This patient is receiving azithromycin by the intravenous route.  Based on criteria approved by the Pharmacy and Therapeutics Committee, the antibiotic(s) is/are being converted to the equivalent oral dose form(s).   DESCRIPTION: These criteria include: Patient being treated for a respiratory tract infection, urinary tract infection, cellulitis or clostridium difficile associated diarrhea if on metronidazole The patient is not neutropenic and does not exhibit a GI malabsorption state The patient is eating (either orally or via tube) and/or has been taking other orally administered medications for a least 24 hours The patient is improving clinically and has a Tmax < 100.5  If you have questions about this conversion, please contact the Pharmacy Department   [x]   [phone] )  Redge Gainer    Milford Cage, PharmD, BCPS Clinical Pharmacist, ASP  Available via secure chat

## 2023-12-09 NOTE — Progress Notes (Signed)
   12/08/23 2155  BiPAP/CPAP/SIPAP  $ Non-Invasive Ventilator  Non-Invasive Vent Subsequent  $ Face Mask Medium Yes  BiPAP/CPAP/SIPAP Pt Type Adult  BiPAP/CPAP/SIPAP SERVO  Mask Type Full face mask  Mask Size Medium  Set Rate 14 breaths/min  Respiratory Rate 23 breaths/min  IPAP 10 cmH20  PEEP 5 cmH20  FiO2 (%) 40 %  Minute Ventilation 12.7  Leak 20  Peak Inspiratory Pressure (PIP) 19  Tidal Volume (Vt) 503  Patient Home Equipment No  Press High Alarm 35 cmH2O  Press Low Alarm 5 cmH2O  CPAP/SIPAP surface wiped down Yes  BiPAP/CPAP /SiPAP Vitals  Pulse Rate 79  Resp (!) 22  SpO2 96 %  MEWS Score/Color  MEWS Score 1  MEWS Score Color [place] Si

## 2023-12-09 NOTE — Evaluation (Signed)
Physical Therapy Evaluation Patient Details Name: [name] MRN: [id number] DOB: 1956-03-08 Today's Date: 12/09/2023  History of Present Illness  68 y.o.  female presented 2/16 with shortness of breath, she was found to have acute on chronic hypoxic/hypercapnic respiratory failure due to COPD exacerbation due to coronavirus infection. PMHx: COPD on chronic prednisone/3 L of oxygen at home, CAD s/p CABG, chronic HFpEF, HTN, HLD, DM-2.   Clinical Impression  Pt admitted with above diagnosis. Evaluation limited by increased work of breathing. 2/3 dyspnea at rest, 3/3 with basic mobility. SpO2 maintains 90-94% on 6L throughout session. Educated on symptom awareness, positioning, energy conservation, and potential options for DME pending progress. Ambulated short distance in room today at supervision level, RW for support without overt instability. RN notified of dyspnea. Pt upright in chair, back supported, cannot tolerate leaning back. Son present in room. Pt currently with functional limitations due to the deficits listed below (see PT Problem List). Pt will benefit from acute skilled PT to increase their independence and safety with mobility to allow discharge.           If plan is discharge home, recommend the following: Assistance with cooking/housework;Assist for transportation   Can travel by private vehicle        Equipment Recommendations Other (comment) (May benefit from rollator - pending improvement in respiratory status.)  Recommendations for Other Services       Functional Status Assessment Patient has had a recent decline in their functional status and demonstrates the ability to make significant improvements in function in a reasonable and predictable amount of time.     Precautions / Restrictions Precautions Precautions: None Recall of Precautions/Restrictions: Intact Precaution/Restrictions Comments: Monitor O2. Restrictions Weight Bearing Restrictions Per Provider  Order: No      Mobility  Bed Mobility               General bed mobility comments: sitting EOB.    Transfers Overall transfer level: Modified independent Equipment used: Rolling walker (2 wheels)               General transfer comment: Good power up, RW provided for safety. No LOB noted.    Ambulation/Gait Ambulation/Gait assistance: Supervision Gait Distance (Feet): 12 Feet Assistive device: Rolling walker (2 wheels) Gait Pattern/deviations: Step-through pattern, Decreased stride length Gait velocity: decr Gait velocity interpretation: <1.31 ft/sec, indicative of household ambulator   General Gait Details: Grossly stable for short distance, limited by dyspnea 3/3. SpO2 90-93% on 6L supplemental O2. No overt LOB or buckling noted. Feels more secure with RW for support, managed lines/leads. Supervision for safety in room.  Stairs            Wheelchair Mobility     Tilt Bed    Modified Rankin (Stroke Patients Only)       Balance Overall balance assessment: Mild deficits observed, not formally tested                                           Pertinent Vitals/Pain Pain Assessment Pain Assessment: Faces Faces Pain Scale: Hurts little more Pain Location: back - reports chronic Pain Descriptors / Indicators: Aching Pain Intervention(s): Monitored during session, Repositioned    Home Living Family/patient expects to be discharged to:: Private residence Living Arrangements: Other relatives (sister) Available Help at Discharge: Family;Available 24 hours/day Type of Home: House Home Access: Level entry  Home Equipment: Rolling Walker (2 wheels);BSC/3in1;Shower seat;Wheelchair - manual Additional Comments: 3L O2 at baseline    Prior Function Prior Level of Function : Independent/Modified Independent;Driving             Mobility Comments: Reports ind at baseline, does not use RW. Carries O2. Denies falls. ADLs  Comments: Stands in shower, Ind with ADLs. She and sister share duties with chores/cooking. Drives.     Extremity/Trunk Assessment   Upper Extremity Assessment Upper Extremity Assessment: Defer to OT evaluation    Lower Extremity Assessment Lower Extremity Assessment: Generalized weakness    Cervical / Trunk Assessment Cervical / Trunk Assessment: Normal  Communication   Communication Communication: No apparent difficulties    Cognition Arousal: Alert Behavior During Therapy: WFL for tasks assessed/performed   PT - Cognitive impairments: No apparent impairments                         Following commands: Intact       Cueing Cueing Techniques: Verbal cues     General Comments General comments (skin integrity, edema, etc.): SpO2 94% on 6L supplemental O2, 2/3 dyspnea, HR 94, BP 143/64    Exercises     Assessment/Plan    PT Assessment Patient needs continued PT services  PT Problem List Decreased activity tolerance;Decreased mobility;Decreased knowledge of use of DME;Cardiopulmonary status limiting activity       PT Treatment Interventions DME instruction;Gait training;Functional mobility training;Therapeutic activities;Therapeutic exercise;Patient/family education    PT Goals (Current goals can be found in the Care Plan section)  Acute Rehab PT Goals Patient Stated Goal: Get well PT Goal Formulation: With patient Time For Goal Achievement: 12/23/23 Potential to Achieve Goals: Good    Frequency Min 1X/week     Co-evaluation               AM-PAC PT "6 Clicks" Mobility  Outcome Measure Help needed turning from your back to your side while in a flat bed without using bedrails?: None Help needed moving from lying on your back to sitting on the side of a flat bed without using bedrails?: None Help needed moving to and from a bed to a chair (including a wheelchair)?: A Little Help needed standing up from a chair using your arms (e.g., wheelchair  or bedside chair)?: A Little Help needed to walk in hospital room?: A Little Help needed climbing 3-5 steps with a railing? : A Little 6 Click Score: 20    End of Session Equipment Utilized During Treatment: Gait belt;Oxygen Activity Tolerance: Other (comment) (Increased WOB, anxious) Patient left: in chair;with call bell/phone within reach;with chair alarm set;with family/visitor present Nurse Communication: Mobility status (Increased WOB) PT Visit Diagnosis: Difficulty in walking, not elsewhere classified (R26.2)    Time: 1610-9604 PT Time Calculation (min) (ACUTE ONLY): 27 min   Charges:   PT Evaluation $PT Eval Low Complexity: 1 Low PT Treatments $Therapeutic Activity: 8-22 mins PT General Charges $$ ACUTE PT VISIT: 1 Visit         Kathlyn Sacramento, PT, DPT [facility]  Rehabilitation Services Physical Therapist Office: [phone] Website: [email or web address]   Berton Mount 12/09/2023, 4:16 PM

## 2023-12-09 NOTE — Progress Notes (Addendum)
PROGRESS NOTE        PATIENT DETAILS Name: [name] Age: 68 y.o. Sex: female Date of Birth: [birth year] Admit Date: 12/07/2023 Admitting Physician John Giovanni, MD OZH:[id number], Len Blalock, MD  Brief Summary: Patient is a 68 y.o.  female with history of COPD on chronic prednisone/3 L of oxygen at home, CAD s/p CABG, chronic HFpEF, HTN, HLD, DM-2 presented with shortness of breath-she was found to have acute on chronic hypoxic/hypercapnic respiratory failure due to COPD exacerbation due to coronavirus infection.  Significant events: 2/16>> SOB-COPD exacerbation-BiPAP-admit to [facility]. 2/17>> on/off BiPAP most of the day 2/18>> liberated off BiPAP-on 5-7 liters of oxygen.  Significant studies: 2/17>> CTA chest: No PE  Significant microbiology data: 2/16>> COVID/influenza/RSV PCR: Negative 2/16>> blood culture: Negative 2/17>> respiratory virus panel:+ve Corona virus OC43  Procedures: None  Consults: None  Subjective: On BiPAP later this morning-pressures liberated off BiPAP after I saw her-is anxious but moving air well.  Appears comfortable.  Objective: Vitals: Blood pressure (!) 156/68, pulse 71, temperature (!) 97.1 F (36.2 C), temperature source Axillary, resp. rate (!) 24, height 5\' 1"  (1.549 m), weight 86.2 kg, SpO2 97%.   Exam: Gen Exam:Alert awake-not in any distress HEENT:atraumatic, normocephalic Chest: Moving air well-some scattered rhonchi. CVS:S1S2 regular Abdomen:soft non tender, non distended Extremities:no edema Neurology: Non focal Skin: no rash  Pertinent Labs/Radiology:    Latest Ref Rng & Units 12/09/2023    5:00 AM 12/08/2023    1:45 AM 12/07/2023   10:34 PM  CBC  WBC 4.0 - 10.5 K/uL 13.1  14.6    Hemoglobin 12.0 - 15.0 g/dL 78.4  69.6  29.5   Hematocrit 36.0 - 46.0 % 41.9  43.1  48.0   Platelets 150 - 400 K/uL 212  209      Lab Results  Component Value Date   NA 140 12/09/2023   K 4.9 12/09/2023   CL 102  12/09/2023   CO2 31 12/09/2023      Assessment/Plan: Acute on chronic hypoxic/hypercarbic respiratory failure secondary to COPD exacerbation due to coronavirus infection. Overall better-required BiPAP last night-liberated off BiPAP this morning-moving air well- some scattered rhonchi-but exam is stable.  Suspect anxiety playing a significant role. Continue scheduled bronchodilators Slowly taper steroids-back to her usual regimen of 10 mg of prednisone daily. No evidence of pneumonia-stop Rocephin-empiric Zithromax x 3 days. No evidence of volume overload but complains that he she gets SOB when she lies flat-will give 1 dose of IV Lasix. Encourage pulmonary toileting-mobilization with PT/OT/mobility tech Keep BiPAP at bedside  Chronic HFpEF Euvolemic IV Lasix today Oral Lasix starting tomorrow  CAD s/p CABG Currently without any anginal symptoms  HTN BP slightly on the higher side Continue bisoprolol/Imdur Monitor for another 24 hours before adjusting further.  DM-2 (A1c 7.7 on 08/29/2023) with steroid-induced hyperglycemia. CBGs on the higher side Increase Semglee to 14 units twice daily Add 3 units of NovoLog with meals Continue SSI Follow/adjust  Recent Labs    12/09/23 0010 12/09/23 0339 12/09/23 0909  GLUCAP 169* 245* 257*    Class 2 Obesity: Estimated body mass index is 35.91 kg/m as calculated from the following:   Height as of this encounter: 5\' 1"  (1.549 m).   Weight as of this encounter: 86.2 kg.   The patient is critically ill with multiple organ system failure and requires high  complexity decision making for assessment and support, frequent evaluation and titration of therapies, advanced monitoring, review of radiographic studies and interpretation of complex data.   Total Critical time spent equals 45 minutes  Code status:   Code Status: Full Code   DVT Prophylaxis: enoxaparin (LOVENOX) injection 40 mg Start: 12/08/23 1000   Family Communication:  Sister at bedside   Disposition Plan: Status is: Inpatient Remains inpatient appropriate because: Severity of illness   Planned Discharge Destination:Home health   Diet: Diet Order             Diet Heart Room service appropriate? Yes; Fluid consistency: Thin  Diet effective now                     Antimicrobial agents: Anti-infectives (From admission, onward)    Start     Dose/Rate Route Frequency Ordered Stop   12/09/23 1030  azithromycin (ZITHROMAX) tablet 500 mg        500 mg Oral Daily 12/09/23 0943 12/11/23 0959   12/08/23 2300  cefTRIAXone (ROCEPHIN) 1 g in sodium chloride 0.9 % 100 mL IVPB        1 g 200 mL/hr over 30 Minutes Intravenous Every 24 hours 12/08/23 0046     12/08/23 1400  doxycycline (VIBRAMYCIN) 100 mg in sodium chloride 0.9 % 250 mL IVPB  Status:  Discontinued        100 mg 125 mL/hr over 120 Minutes Intravenous Every 12 hours 12/08/23 0046 12/08/23 1328   12/08/23 1330  azithromycin (ZITHROMAX) 500 mg in sodium chloride 0.9 % 250 mL IVPB  Status:  Discontinued        500 mg 250 mL/hr over 60 Minutes Intravenous Every 24 hours 12/08/23 1328 12/09/23 0943   12/07/23 2300  cefTRIAXone (ROCEPHIN) 1 g in sodium chloride 0.9 % 100 mL IVPB        1 g 200 mL/hr over 30 Minutes Intravenous  Once 12/07/23 2259 12/08/23 0009   12/07/23 2300  doxycycline (VIBRAMYCIN) 100 mg in sodium chloride 0.9 % 250 mL IVPB        100 mg 125 mL/hr over 120 Minutes Intravenous  Once 12/07/23 2259 12/08/23 0329        MEDICATIONS: Scheduled Meds:  atorvastatin  40 mg Oral Daily   azithromycin  500 mg Oral Daily   bisoprolol  5 mg Oral Daily   budesonide (PULMICORT) nebulizer solution  0.25 mg Nebulization BID   enoxaparin (LOVENOX) injection  40 mg Subcutaneous Q24H   guaiFENesin  600 mg Oral BID   insulin aspart  0-20 Units Subcutaneous TID WC   insulin aspart  0-5 Units Subcutaneous QHS   insulin aspart  6 Units Subcutaneous TID WC   insulin glargine-yfgn  10  Units Subcutaneous BID   ipratropium  0.5 mg Nebulization Q6H   isosorbide mononitrate  90 mg Oral Daily   levalbuterol  0.63 mg Nebulization Q6H   magnesium oxide  400 mg Oral Daily   methylPREDNISolone (SOLU-MEDROL) injection  60 mg Intravenous Q12H   Continuous Infusions:  albuterol Stopped (12/08/23 0058)   cefTRIAXone (ROCEPHIN)  IV Stopped (12/08/23 2319)   PRN Meds:.acetaminophen **OR** acetaminophen, hydrOXYzine, naLOXone (NARCAN)  injection   I have personally reviewed following labs and imaging studies  LABORATORY DATA: CBC: Recent Labs  Lab 12/07/23 2228 12/07/23 2234 12/08/23 0145 12/09/23 0500  WBC 21.7*  --  14.6* 13.1*  NEUTROABS 15.3*  --   --   --   HGB  14.8 16.3* 13.3 12.9  HCT 47.6* 48.0* 43.1 41.9  MCV 89.3  --  90.4 91.3  PLT 278  --  209 212    Basic Metabolic Panel: Recent Labs  Lab 12/07/23 2228 12/07/23 2234 12/08/23 0145 12/09/23 0500  NA 136 137 139 140  K 4.0 4.1 4.4 4.9  CL 95*  --  100 102  CO2 33*  --  27 31  GLUCOSE 339*  --  340* 266*  BUN 13  --  11 10  CREATININE 0.57  --  0.59 0.64  CALCIUM 8.9  --  8.1* 8.7*    GFR: Estimated Creatinine Clearance: 67.2 mL/min (by C-G formula based on SCr of 0.64 mg/dL).  Liver Function Tests: No results for input(s): "AST", "ALT", "ALKPHOS", "BILITOT", "PROT", "ALBUMIN" in the last 168 hours. No results for input(s): "LIPASE", "AMYLASE" in the last 168 hours. No results for input(s): "AMMONIA" in the last 168 hours.  Coagulation Profile: Recent Labs  Lab 12/07/23 2303  INR 1.0    Cardiac Enzymes: No results for input(s): "CKTOTAL", "CKMB", "CKMBINDEX", "TROPONINI" in the last 168 hours.  BNP (last 3 results) No results for input(s): "PROBNP" in the last 8760 hours.  Lipid Profile: No results for input(s): "CHOL", "HDL", "LDLCALC", "TRIG", "CHOLHDL", "LDLDIRECT" in the last 72 hours.  Thyroid Function Tests: No results for input(s): "TSH", "T4TOTAL", "FREET4", "T3FREE",  "THYROIDAB" in the last 72 hours.  Anemia Panel: No results for input(s): "VITAMINB12", "FOLATE", "FERRITIN", "TIBC", "IRON", "RETICCTPCT" in the last 72 hours.  Urine analysis:    Component Value Date/Time   COLORURINE YELLOW 02/27/2023 0620   APPEARANCEUR HAZY (A) 02/27/2023 0620   LABSPEC 1.023 02/27/2023 0620   PHURINE 6.0 02/27/2023 0620   GLUCOSEU NEGATIVE 02/27/2023 0620   GLUCOSEU NEGATIVE 02/17/2023 1557   HGBUR NEGATIVE 02/27/2023 0620   BILIRUBINUR NEGATIVE 02/27/2023 0620   BILIRUBINUR negative 02/24/2023 0924   KETONESUR NEGATIVE 02/27/2023 0620   PROTEINUR 30 (A) 02/27/2023 0620   UROBILINOGEN 0.2 02/24/2023 0924   UROBILINOGEN 0.2 02/17/2023 1557   NITRITE NEGATIVE 02/27/2023 0620   LEUKOCYTESUR NEGATIVE 02/27/2023 0620    Sepsis Labs: Lactic Acid, Venous    Component Value Date/Time   LATICACIDVEN 0.8 12/09/2023 0500    MICROBIOLOGY: Recent Results (from the past 240 hours)  Resp panel by RT-PCR (RSV, Flu A&B, Covid) Anterior Nasal Swab     Status: None   Collection Time: 12/07/23 10:28 PM   Specimen: Anterior Nasal Swab  Result Value Ref Range Status   SARS Coronavirus 2 by RT PCR NEGATIVE NEGATIVE Final   Influenza A by PCR NEGATIVE NEGATIVE Final   Influenza B by PCR NEGATIVE NEGATIVE Final    Comment: (NOTE) The Xpert Xpress SARS-CoV-2/FLU/RSV plus assay is intended as an aid in the diagnosis of influenza from Nasopharyngeal swab specimens and should not be used as a sole basis for treatment. Nasal washings and aspirates are unacceptable for Xpert Xpress SARS-CoV-2/FLU/RSV testing.  Fact Sheet for Patients: BloggerCourse.com  Fact Sheet for Healthcare Providers: SeriousBroker.it  This test is not yet approved or cleared by the [place] FDA and has been authorized for detection and/or diagnosis of SARS-CoV-2 by FDA under an Emergency Use Authorization (EUA). This EUA will remain in effect  (meaning this test can be used) for the duration of the COVID-19 declaration under Section 564(b)(1) of the Act, 21 U.S.C. section 360bbb-3(b)(1), unless the authorization is terminated or revoked.     Resp Syncytial Virus by PCR NEGATIVE NEGATIVE  Final    Comment: (NOTE) Fact Sheet for Patients: BloggerCourse.com  Fact Sheet for Healthcare Providers: SeriousBroker.it  This test is not yet approved or cleared by the [place] FDA and has been authorized for detection and/or diagnosis of SARS-CoV-2 by FDA under an Emergency Use Authorization (EUA). This EUA will remain in effect (meaning this test can be used) for the duration of the COVID-19 declaration under Section 564(b)(1) of the Act, 21 U.S.C. section 360bbb-3(b)(1), unless the authorization is terminated or revoked.  Performed at [facility], [address] [place], [place] [address]   Blood Culture (routine x 2)     Status: None (Preliminary result)   Collection Time: 12/07/23 11:35 PM   Specimen: BLOOD  Result Value Ref Range Status   Specimen Description BLOOD BLOOD RIGHT HAND  Final   Special Requests AEROBIC BOTTLE ONLY Blood Culture adequate volume  Final   Culture   Final    NO GROWTH 1 DAY Performed at [facility], [address] [place], [place] [address]    Report Status PENDING  Incomplete  Blood Culture (routine x 2)     Status: None (Preliminary result)   Collection Time: 12/07/23 11:35 PM   Specimen: BLOOD  Result Value Ref Range Status   Specimen Description BLOOD RIGHT ANTECUBITAL  Final   Special Requests   Final    BOTTLES DRAWN AEROBIC AND ANAEROBIC Blood Culture adequate volume   Culture   Final    NO GROWTH 1 DAY Performed at [facility], [address] [place], [place] [address]    Report Status PENDING  Incomplete  Respiratory (~20 pathogens) panel by PCR     Status: Abnormal   Collection Time: 12/08/23  7:52 AM    Specimen: Nasopharyngeal Swab; Respiratory  Result Value Ref Range Status   Adenovirus NOT DETECTED NOT DETECTED Final   Coronavirus 229E NOT DETECTED NOT DETECTED Final    Comment: (NOTE) The Coronavirus on the Respiratory Panel, DOES NOT test for the novel  Coronavirus (2019 nCoV)    Coronavirus HKU1 NOT DETECTED NOT DETECTED Final   Coronavirus NL63 NOT DETECTED NOT DETECTED Final   Coronavirus OC43 DETECTED (A) NOT DETECTED Final   Metapneumovirus NOT DETECTED NOT DETECTED Final   Rhinovirus / Enterovirus NOT DETECTED NOT DETECTED Final   Influenza A NOT DETECTED NOT DETECTED Final   Influenza B NOT DETECTED NOT DETECTED Final   Parainfluenza Virus 1 NOT DETECTED NOT DETECTED Final   Parainfluenza Virus 2 NOT DETECTED NOT DETECTED Final   Parainfluenza Virus 3 NOT DETECTED NOT DETECTED Final   Parainfluenza Virus 4 NOT DETECTED NOT DETECTED Final   Respiratory Syncytial Virus NOT DETECTED NOT DETECTED Final   Bordetella pertussis NOT DETECTED NOT DETECTED Final   Bordetella Parapertussis NOT DETECTED NOT DETECTED Final   Chlamydophila pneumoniae NOT DETECTED NOT DETECTED Final   Mycoplasma pneumoniae NOT DETECTED NOT DETECTED Final    Comment: Performed at [facility], [address], [place], [place] [address]    RADIOLOGY STUDIES/RESULTS: DG Chest Port 1V same Day Result Date: 12/09/2023 CLINICAL DATA:  Shortness of breath. EXAM: PORTABLE CHEST 1 VIEW COMPARISON:  December 07, 2023. FINDINGS: Stable cardiomediastinal silhouette. Status post coronary bypass graft. Lungs are clear. Bony thorax is unremarkable. IMPRESSION: No active disease. Electronically Signed   By: Lupita Raider M.D.   On: 12/09/2023 09:57   CT Angio Chest Pulmonary Embolism (PE) W or WO Contrast Result Date:  12/08/2023 CLINICAL DATA:  Pulmonary embolism suspected. Shortness of breath and chest pain with cough EXAM: CT ANGIOGRAPHY CHEST WITH CONTRAST TECHNIQUE: Multidetector CT imaging of the chest  was performed using the standard protocol during bolus administration of intravenous contrast. Multiplanar CT image reconstructions and MIPs were obtained to evaluate the vascular anatomy. RADIATION DOSE REDUCTION: This exam was performed according to the departmental dose-optimization program which includes automated exposure control, adjustment of the mA and/or kV according to patient size and/or use of iterative reconstruction technique. CONTRAST:  50mL OMNIPAQUE IOHEXOL 350 MG/ML SOLN COMPARISON:  01/28/2020 FINDINGS: Cardiovascular: Satisfactory opacification of the pulmonary arteries to the segmental level. No evidence of pulmonary embolism. Normal heart size. No pericardial effusion. Scattered atheromatous calcification. CABG. Mediastinum/Nodes: No mass or worrisome lymph node enlargement when compared to prior. Unremarkable esophagus Lungs/Pleura: Degraded by motion artifact. There is no edema, consolidation, effusion, or pneumothorax. Pleural calcification posteriorly on the right which could be from old inflammation or trauma, stable from prior. Mild pulmonary scarring. Upper Abdomen: No acute finding Musculoskeletal: Remote T10 superior endplate fracture. Generalized spondylosis. Review of the MIP images confirms the above findings. IMPRESSION: Negative for pulmonary embolism or other acute finding. Electronically Signed   By: Tiburcio Pea M.D.   On: 12/08/2023 04:25   DG Chest Port 1 View Result Date: 12/07/2023 CLINICAL DATA:  Shortness of breath. EXAM: PORTABLE CHEST 1 VIEW COMPARISON:  02/24/2023 FINDINGS: Prior median sternotomy and CABG.The cardiomediastinal contours are stable, heart size upper normal. Subsegmental atelectasis or scarring in the left mid lung. Minimal ill-defined patchy opacity at the right lung base. No pleural fluid or pneumothorax. No acute osseous abnormalities are seen. IMPRESSION: 1. Minimal ill-defined patchy opacity at the right lung base, may represent atelectasis or  pneumonia. 2. Subsegmental atelectasis or scarring in the left mid lung. Electronically Signed   By: Narda Rutherford M.D.   On: 12/07/2023 22:43     LOS: 2 days   Jeoffrey Massed, MD  [facility]    To contact the attending provider between 7A-7P or the covering provider during after hours 7P-7A, please log into the web site [email or web address] and access using universal [place] password for that web site. If you do not have the password, please call the hospital operator.  12/09/2023, 10:22 AM

## 2023-12-09 NOTE — Progress Notes (Signed)
Patient visibly anxious shouting for help and attempting to get out of bed stating "I forgot how to sit down" This RN placed patient back on BIPAP and notified Dr. Eilene Ghazi and respiratory therapy. Pt O2 saturations during event sustained in the 90s.Pt very tearful, feeling SOB, and breathing over BIPAP. MD states to notify day team and no further orders at this time.  Patient encouraged deep breathing exercises and reassured. Patient sitting in bed comfortably. Bed alarm on and call light in reach

## 2023-12-09 NOTE — Plan of Care (Signed)

## 2023-12-10 ENCOUNTER — Other Ambulatory Visit: Payer: Self-pay

## 2023-12-10 DIAGNOSIS — J441 Chronic obstructive pulmonary disease with (acute) exacerbation: Secondary | ICD-10-CM | POA: Diagnosis not present

## 2023-12-10 DIAGNOSIS — J9621 Acute and chronic respiratory failure with hypoxia: Secondary | ICD-10-CM | POA: Diagnosis not present

## 2023-12-10 DIAGNOSIS — I10 Essential (primary) hypertension: Secondary | ICD-10-CM | POA: Diagnosis not present

## 2023-12-10 DIAGNOSIS — E119 Type 2 diabetes mellitus without complications: Secondary | ICD-10-CM | POA: Diagnosis not present

## 2023-12-10 LAB — GLUCOSE, CAPILLARY
Glucose-Capillary: 201 mg/dL — ABNORMAL HIGH (ref 70–99)
Glucose-Capillary: 266 mg/dL — ABNORMAL HIGH (ref 70–99)
Glucose-Capillary: 261 mg/dL — ABNORMAL HIGH (ref 70–99)
Glucose-Capillary: 307 mg/dL — ABNORMAL HIGH (ref 70–99)
Glucose-Capillary: 121 mg/dL — ABNORMAL HIGH (ref 70–99)

## 2023-12-10 MED ORDER — FUROSEMIDE 20 MG PO TABS
20.0000 mg | ORAL_TABLET | Freq: Every day | ORAL | Status: DC
  Administered 2023-12-10 – 2023-12-13 (×4): 20 mg via ORAL
  Filled 2023-12-10 (×4): qty 1

## 2023-12-10 MED ORDER — PREDNISONE 20 MG PO TABS
60.0000 mg | ORAL_TABLET | Freq: Every day | ORAL | Status: DC
  Administered 2023-12-11: 60 mg via ORAL
  Filled 2023-12-10: qty 3

## 2023-12-10 NOTE — Progress Notes (Signed)
PROGRESS NOTE        PATIENT DETAILS Name: [name] Age: 68 y.o. Sex: female Date of Birth: 03-17-1956 Admit Date: 12/07/2023 Admitting Physician John Giovanni, MD UJW:[id number], Len Blalock, MD  Brief Summary: Patient is a 68 y.o.  female with history of COPD on chronic prednisone/3 L of oxygen at home, CAD s/p CABG, chronic HFpEF, HTN, HLD, DM-2 presented with shortness of breath-she was found to have acute on chronic hypoxic/hypercapnic respiratory failure due to COPD exacerbation due to coronavirus infection.  Significant events: 2/16>> SOB-COPD exacerbation-BiPAP-admit to [facility]. 2/17>> on/off BiPAP most of the day 2/18>> liberated off BiPAP-on 5-7 liters of oxygen.  Significant studies: 2/17>> CTA chest: No PE  Significant microbiology data: 2/16>> COVID/influenza/RSV PCR: Negative 2/16>> blood culture: Negative 2/17>> respiratory virus panel:+ve Corona virus OC43  Procedures: None  Consults: None  Subjective: Feels much better this morning-use a BiPAP last night.  Down to 4 L of oxygen.  Significantly less anxiety level.  Objective: Vitals: Blood pressure (!) 132/90, pulse 92, temperature 98.4 F (36.9 C), temperature source Oral, resp. rate (!) 28, height 5\' 1"  (1.549 m), weight 86.2 kg, SpO2 93%.   Exam: Gen Exam:Alert awake-not in any distress HEENT:atraumatic, normocephalic Chest: B/L clear to auscultation anteriorly-hardly any rhonchi today. CVS:S1S2 regular Abdomen:soft non tender, non distended Extremities:no edema Neurology: Non focal Skin: no rash  Pertinent Labs/Radiology:    Latest Ref Rng & Units 12/09/2023    5:00 AM 12/08/2023    1:45 AM 12/07/2023   10:34 PM  CBC  WBC 4.0 - 10.5 K/uL 13.1  14.6    Hemoglobin 12.0 - 15.0 g/dL 47.8  29.5  62.1   Hematocrit 36.0 - 46.0 % 41.9  43.1  48.0   Platelets 150 - 400 K/uL 212  209      Lab Results  Component Value Date   NA 140 12/09/2023   K 4.9 12/09/2023   CL 102  12/09/2023   CO2 31 12/09/2023      Assessment/Plan: Acute on chronic hypoxic/hypercarbic respiratory failure secondary to COPD exacerbation due to coronavirus infection. Marked improvement overnight-stable on just 4 L of oxygen Taper steroids further Continue scheduled bronchodilators Mobilize Incentive spirometry/flutter valve.    Chronic HFpEF Euvolemic Lasix 20 mg daily Follow-up volume status closely.  CAD s/p CABG Currently without any anginal symptoms  HTN BP stable today Continue bisoprolol/Imdur.   DM-2 (A1c 7.7 on 08/29/2023) with steroid-induced hyperglycemia. CBGs stable Semglee 14 units twice daily Units of NovoLog with meals SSI Follow/adjust.   Recent Labs    12/09/23 2312 12/10/23 0809 12/10/23 1230  GLUCAP 110* 201* 266*    Class 2 Obesity: Estimated body mass index is 35.91 kg/m as calculated from the following:   Height as of this encounter: 5\' 1"  (1.549 m).   Weight as of this encounter: 86.2 kg.    Code status:   Code Status: Full Code   DVT Prophylaxis: enoxaparin (LOVENOX) injection 40 mg Start: 12/08/23 1000   Family Communication: Sister at bedside   Disposition Plan: Status is: Inpatient Remains inpatient appropriate because: Severity of illness   Planned Discharge Destination:Home health   Diet: Diet Order             Diet Heart Room service appropriate? Yes; Fluid consistency: Thin  Diet effective now  Antimicrobial agents: Anti-infectives (From admission, onward)    Start     Dose/Rate Route Frequency Ordered Stop   12/09/23 1030  azithromycin (ZITHROMAX) tablet 500 mg        500 mg Oral Daily 12/09/23 0943 12/10/23 0840   12/08/23 2300  cefTRIAXone (ROCEPHIN) 1 g in sodium chloride 0.9 % 100 mL IVPB  Status:  Discontinued        1 g 200 mL/hr over 30 Minutes Intravenous Every 24 hours 12/08/23 0046 12/09/23 1407   12/08/23 1400  doxycycline (VIBRAMYCIN) 100 mg in sodium chloride 0.9  % 250 mL IVPB  Status:  Discontinued        100 mg 125 mL/hr over 120 Minutes Intravenous Every 12 hours 12/08/23 0046 12/08/23 1328   12/08/23 1330  azithromycin (ZITHROMAX) 500 mg in sodium chloride 0.9 % 250 mL IVPB  Status:  Discontinued        500 mg 250 mL/hr over 60 Minutes Intravenous Every 24 hours 12/08/23 1328 12/09/23 0943   12/07/23 2300  cefTRIAXone (ROCEPHIN) 1 g in sodium chloride 0.9 % 100 mL IVPB        1 g 200 mL/hr over 30 Minutes Intravenous  Once 12/07/23 2259 12/08/23 0009   12/07/23 2300  doxycycline (VIBRAMYCIN) 100 mg in sodium chloride 0.9 % 250 mL IVPB        100 mg 125 mL/hr over 120 Minutes Intravenous  Once 12/07/23 2259 12/08/23 0329        MEDICATIONS: Scheduled Meds:  atorvastatin  40 mg Oral Daily   bisoprolol  5 mg Oral Daily   budesonide (PULMICORT) nebulizer solution  0.25 mg Nebulization BID   enoxaparin (LOVENOX) injection  40 mg Subcutaneous Q24H   guaiFENesin  600 mg Oral BID   insulin aspart  0-20 Units Subcutaneous TID WC   insulin aspart  0-5 Units Subcutaneous QHS   insulin aspart  6 Units Subcutaneous TID WC   insulin glargine-yfgn  10 Units Subcutaneous BID   ipratropium  0.5 mg Nebulization Q6H   isosorbide mononitrate  90 mg Oral Daily   levalbuterol  0.63 mg Nebulization Q6H   magnesium oxide  400 mg Oral Daily   methylPREDNISolone (SOLU-MEDROL) injection  60 mg Intravenous Q12H   Continuous Infusions:  albuterol Stopped (12/08/23 0058)   PRN Meds:.acetaminophen **OR** acetaminophen, hydrOXYzine, naLOXone (NARCAN)  injection   I have personally reviewed following labs and imaging studies  LABORATORY DATA: CBC: Recent Labs  Lab 12/07/23 2228 12/07/23 2234 12/08/23 0145 12/09/23 0500  WBC 21.7*  --  14.6* 13.1*  NEUTROABS 15.3*  --   --   --   HGB 14.8 16.3* 13.3 12.9  HCT 47.6* 48.0* 43.1 41.9  MCV 89.3  --  90.4 91.3  PLT 278  --  209 212    Basic Metabolic Panel: Recent Labs  Lab 12/07/23 2228  12/07/23 2234 12/08/23 0145 12/09/23 0500  NA 136 137 139 140  K 4.0 4.1 4.4 4.9  CL 95*  --  100 102  CO2 33*  --  27 31  GLUCOSE 339*  --  340* 266*  BUN 13  --  11 10  CREATININE 0.57  --  0.59 0.64  CALCIUM 8.9  --  8.1* 8.7*    GFR: Estimated Creatinine Clearance: 67.2 mL/min (by C-G formula based on SCr of 0.64 mg/dL).  Liver Function Tests: No results for input(s): "AST", "ALT", "ALKPHOS", "BILITOT", "PROT", "ALBUMIN" in the last 168 hours. No results for  input(s): "LIPASE", "AMYLASE" in the last 168 hours. No results for input(s): "AMMONIA" in the last 168 hours.  Coagulation Profile: Recent Labs  Lab 12/07/23 2303  INR 1.0    Cardiac Enzymes: No results for input(s): "CKTOTAL", "CKMB", "CKMBINDEX", "TROPONINI" in the last 168 hours.  BNP (last 3 results) No results for input(s): "PROBNP" in the last 8760 hours.  Lipid Profile: No results for input(s): "CHOL", "HDL", "LDLCALC", "TRIG", "CHOLHDL", "LDLDIRECT" in the last 72 hours.  Thyroid Function Tests: No results for input(s): "TSH", "T4TOTAL", "FREET4", "T3FREE", "THYROIDAB" in the last 72 hours.  Anemia Panel: No results for input(s): "VITAMINB12", "FOLATE", "FERRITIN", "TIBC", "IRON", "RETICCTPCT" in the last 72 hours.  Urine analysis:    Component Value Date/Time   COLORURINE YELLOW 02/27/2023 0620   APPEARANCEUR HAZY (A) 02/27/2023 0620   LABSPEC 1.023 02/27/2023 0620   PHURINE 6.0 02/27/2023 0620   GLUCOSEU NEGATIVE 02/27/2023 0620   GLUCOSEU NEGATIVE 02/17/2023 1557   HGBUR NEGATIVE 02/27/2023 0620   BILIRUBINUR NEGATIVE 02/27/2023 0620   BILIRUBINUR negative 02/24/2023 0924   KETONESUR NEGATIVE 02/27/2023 0620   PROTEINUR 30 (A) 02/27/2023 0620   UROBILINOGEN 0.2 02/24/2023 0924   UROBILINOGEN 0.2 02/17/2023 1557   NITRITE NEGATIVE 02/27/2023 0620   LEUKOCYTESUR NEGATIVE 02/27/2023 0620    Sepsis Labs: Lactic Acid, Venous    Component Value Date/Time   LATICACIDVEN 0.8 12/09/2023  0500    MICROBIOLOGY: Recent Results (from the past 240 hours)  Resp panel by RT-PCR (RSV, Flu A&B, Covid) Anterior Nasal Swab     Status: None   Collection Time: 12/07/23 10:28 PM   Specimen: Anterior Nasal Swab  Result Value Ref Range Status   SARS Coronavirus 2 by RT PCR NEGATIVE NEGATIVE Final   Influenza A by PCR NEGATIVE NEGATIVE Final   Influenza B by PCR NEGATIVE NEGATIVE Final    Comment: (NOTE) The Xpert Xpress SARS-CoV-2/FLU/RSV plus assay is intended as an aid in the diagnosis of influenza from Nasopharyngeal swab specimens and should not be used as a sole basis for treatment. Nasal washings and aspirates are unacceptable for Xpert Xpress SARS-CoV-2/FLU/RSV testing.  Fact Sheet for Patients: BloggerCourse.com  Fact Sheet for Healthcare Providers: SeriousBroker.it  This test is not yet approved or cleared by the [place] FDA and has been authorized for detection and/or diagnosis of SARS-CoV-2 by FDA under an Emergency Use Authorization (EUA). This EUA will remain in effect (meaning this test can be used) for the duration of the COVID-19 declaration under Section 564(b)(1) of the Act, 21 U.S.C. section 360bbb-3(b)(1), unless the authorization is terminated or revoked.     Resp Syncytial Virus by PCR NEGATIVE NEGATIVE Final    Comment: (NOTE) Fact Sheet for Patients: BloggerCourse.com  Fact Sheet for Healthcare Providers: SeriousBroker.it  This test is not yet approved or cleared by the [place] FDA and has been authorized for detection and/or diagnosis of SARS-CoV-2 by FDA under an Emergency Use Authorization (EUA). This EUA will remain in effect (meaning this test can be used) for the duration of the COVID-19 declaration under Section 564(b)(1) of the Act, 21 U.S.C. section 360bbb-3(b)(1), unless the authorization is terminated or revoked.  Performed  at [facility], [address], [place], [place] [address]   Blood Culture (routine x 2)     Status: None (Preliminary result)   Collection Time: 12/07/23 11:35 PM   Specimen: BLOOD  Result Value Ref Range Status   Specimen Description BLOOD BLOOD RIGHT HAND  Final  Special Requests AEROBIC BOTTLE ONLY Blood Culture adequate volume  Final   Culture   Final    NO GROWTH 2 DAYS Performed at [facility], [address] [place], [place] [address]    Report Status PENDING  Incomplete  Blood Culture (routine x 2)     Status: None (Preliminary result)   Collection Time: 12/07/23 11:35 PM   Specimen: BLOOD  Result Value Ref Range Status   Specimen Description BLOOD RIGHT ANTECUBITAL  Final   Special Requests   Final    BOTTLES DRAWN AEROBIC AND ANAEROBIC Blood Culture adequate volume   Culture   Final    NO GROWTH 2 DAYS Performed at [facility], [address] [place], [place] [address]    Report Status PENDING  Incomplete  Respiratory (~20 pathogens) panel by PCR     Status: Abnormal   Collection Time: 12/08/23  7:52 AM   Specimen: Nasopharyngeal Swab; Respiratory  Result Value Ref Range Status   Adenovirus NOT DETECTED NOT DETECTED Final   Coronavirus 229E NOT DETECTED NOT DETECTED Final    Comment: (NOTE) The Coronavirus on the Respiratory Panel, DOES NOT test for the novel  Coronavirus (2019 nCoV)    Coronavirus HKU1 NOT DETECTED NOT DETECTED Final   Coronavirus NL63 NOT DETECTED NOT DETECTED Final   Coronavirus OC43 DETECTED (A) NOT DETECTED Final   Metapneumovirus NOT DETECTED NOT DETECTED Final   Rhinovirus / Enterovirus NOT DETECTED NOT DETECTED Final   Influenza A NOT DETECTED NOT DETECTED Final   Influenza B NOT DETECTED NOT DETECTED Final   Parainfluenza Virus 1 NOT DETECTED NOT DETECTED Final   Parainfluenza Virus 2 NOT DETECTED NOT DETECTED Final   Parainfluenza Virus 3 NOT DETECTED NOT DETECTED Final   Parainfluenza Virus 4 NOT DETECTED NOT  DETECTED Final   Respiratory Syncytial Virus NOT DETECTED NOT DETECTED Final   Bordetella pertussis NOT DETECTED NOT DETECTED Final   Bordetella Parapertussis NOT DETECTED NOT DETECTED Final   Chlamydophila pneumoniae NOT DETECTED NOT DETECTED Final   Mycoplasma pneumoniae NOT DETECTED NOT DETECTED Final    Comment: Performed at [facility], [address], [place], [place] [address]    RADIOLOGY STUDIES/RESULTS: DG Chest Port 1V same Day Result Date: 12/09/2023 CLINICAL DATA:  Shortness of breath. EXAM: PORTABLE CHEST 1 VIEW COMPARISON:  December 07, 2023. FINDINGS: Stable cardiomediastinal silhouette. Status post coronary bypass graft. Lungs are clear. Bony thorax is unremarkable. IMPRESSION: No active disease. Electronically Signed   By: Lupita Raider M.D.   On: 12/09/2023 09:57     LOS: 3 days   Jeoffrey Massed, MD  [facility]    To contact the attending provider between 7A-7P or the covering provider during after hours 7P-7A, please log into the web site [email or web address] and access using universal [place] password for that web site. If you do not have the password, please call the hospital operator.  12/10/2023, 3:32 PM

## 2023-12-10 NOTE — TOC CM/SW Note (Signed)
Transition of Care [facility]) - Inpatient Brief Assessment   Patient Details  Name: [name] MRN: [id number] Date of Birth: 16-Sep-1956  Transition of [facility]) CM/SW Contact:    Mearl Latin, LCSW Phone Number: 12/10/2023, 3:34 PM   Clinical Narrative: Patient admitted from home with respiratory failure d/t Coronavirus. TOC following for rehab needs.     Transition of Care Asessment: Insurance and Status: Insurance coverage has been reviewed Patient has primary care physician: Yes Home environment has been reviewed: From home Prior level of function:: Independent Prior/Current Home Services: Current home services (Home O2) Social Drivers of Health Review: SDOH reviewed no interventions necessary Readmission risk has been reviewed: Yes Transition of care needs: transition of care needs identified, TOC will continue to follow

## 2023-12-10 NOTE — Plan of Care (Signed)

## 2023-12-10 NOTE — Evaluation (Signed)
Occupational Therapy Evaluation Patient Details Name: [name] MRN: [id number] DOB: 01/09/1956 Today's Date: 12/10/2023   History of Present Illness   68 y.o.  female presented 2/16 with shortness of breath, she was found to have acute on chronic hypoxic/hypercapnic respiratory failure due to COPD exacerbation due to coronavirus infection. PMHx: COPD on chronic prednisone/3 L of oxygen at home, CAD s/p CABG, chronic HFpEF, HTN, HLD, DM-2     Clinical Impressions Prior to admission pt was able to independently perform her ADLS and do some IADLS with no device and 3L of O2.  Currently patient is supervision with ADLS and needing cueing for pursed lip breathing.  Her activity tolerance is limited, but was able to stand x 3 minutes, talking with no evident SOB.  She has good strength, flexibility and is independent with managing her O2 at home while doing ADLs.  Will continue to follow in the hospital to facilitate home D/C, but do not recommend any follow up OT at this time.      If plan is discharge home, recommend the following:   A little help with walking and/or transfers;A little help with bathing/dressing/bathroom;Assistance with cooking/housework;Assist for transportation;Help with stairs or ramp for entrance     Functional Status Assessment   Patient has had a recent decline in their functional status and demonstrates the ability to make significant improvements in function in a reasonable and predictable amount of time.     Equipment Recommendations   None recommended by OT     Recommendations for Other Services         Precautions/Restrictions   Precautions Precautions: None Recall of Precautions/Restrictions: Intact Precaution/Restrictions Comments: Monitor O2. Restrictions Weight Bearing Restrictions Per Provider Order: No     Mobility Bed Mobility               General bed mobility comments: sitting in chair    Transfers Overall transfer  level: Modified independent Equipment used: None                      Balance Overall balance assessment: Mild deficits observed, not formally tested                                         ADL either performed or assessed with clinical judgement   ADL Overall ADL's : Needs assistance/impaired Eating/Feeding: Independent   Grooming: Supervision/safety   Upper Body Bathing: Supervision/ safety   Lower Body Bathing: Supervison/ safety   Upper Body Dressing : Supervision/safety   Lower Body Dressing: Supervision/safety   Toilet Transfer: Supervision/safety   Toileting- Clothing Manipulation and Hygiene: Supervision/safety       Functional mobility during ADLs: Supervision/safety General ADL Comments: Patient on 3L or O2 - Destated to 86 amb to the bathroom, but returned to 89 after purse lip breathing.  Pt reports having a 74foot line and moves through out her house with no AD     Vision Baseline Vision/History: 1 Wears glasses Ability to See in Adequate Light: 0 Adequate Patient Visual Report: No change from baseline       Perception         Praxis         Pertinent Vitals/Pain Pain Assessment Pain Assessment: No/denies pain     Extremity/Trunk Assessment Upper Extremity Assessment Upper Extremity Assessment: Overall WFL for tasks assessed   Lower Extremity Assessment  Lower Extremity Assessment: Defer to PT evaluation   Cervical / Trunk Assessment Cervical / Trunk Assessment: Normal   Communication Communication Communication: No apparent difficulties   Cognition Arousal: Alert Behavior During Therapy: WFL for tasks assessed/performed Cognition: No apparent impairments                               Following commands: Intact       Cueing  General Comments          Exercises     Shoulder Instructions      Home Living Family/patient expects to be discharged to:: Private residence Living  Arrangements: Other relatives Available Help at Discharge: Family;Available 24 hours/day Type of Home: House Home Access: Level entry           Bathroom Shower/Tub: Producer, television/film/video: Standard Bathroom Accessibility: Yes   Home Equipment: Agricultural consultant (2 wheels);BSC/3in1;Shower seat;Wheelchair - manual   Additional Comments: 3L O2 at baseline      Prior Functioning/Environment Prior Level of Function : Independent/Modified Independent               ADLs Comments: Stands in shower, Ind with ADLs. She and sister share duties with chores/cooking, however patient's sister does most of the Iadls  Drives.    OT Problem List: Decreased activity tolerance;Impaired balance (sitting and/or standing);Cardiopulmonary status limiting activity;Obesity   OT Treatment/Interventions: Self-care/ADL training;Therapeutic activities;Energy conservation;Patient/family education;Balance training      OT Goals(Current goals can be found in the care plan section)   Acute Rehab OT Goals Patient Stated Goal: I want to go home OT Goal Formulation: With patient Time For Goal Achievement: 12/24/23 Potential to Achieve Goals: Good   OT Frequency:  Min 1X/week    Co-evaluation              AM-PAC OT "6 Clicks" Daily Activity     Outcome Measure Help from another person eating meals?: None Help from another person taking care of personal grooming?: None Help from another person toileting, which includes using toliet, bedpan, or urinal?: A Little Help from another person bathing (including washing, rinsing, drying)?: A Little Help from another person to put on and taking off regular upper body clothing?: A Little Help from another person to put on and taking off regular lower body clothing?: A Little 6 Click Score: 20   End of Session Equipment Utilized During Treatment: Gait belt;Oxygen Nurse Communication: Mobility status  Activity Tolerance: Patient tolerated  treatment well Patient left: in chair;with call bell/phone within reach  OT Visit Diagnosis: Unsteadiness on feet (R26.81);Muscle weakness (generalized) (M62.81)                Time: 6301-6010 OT Time Calculation (min): 38 min Charges:  OT General Charges $OT Visit: 1 Visit OT Evaluation $OT Eval Low Complexity: 1 Low OT Treatments $Self Care/Home Management : 8-22 mins  Hal Neer OTR/L   Malachi Bonds 12/10/2023, 4:01 PM

## 2023-12-11 ENCOUNTER — Other Ambulatory Visit: Payer: Self-pay

## 2023-12-11 DIAGNOSIS — I10 Essential (primary) hypertension: Secondary | ICD-10-CM | POA: Diagnosis not present

## 2023-12-11 DIAGNOSIS — J9621 Acute and chronic respiratory failure with hypoxia: Secondary | ICD-10-CM | POA: Diagnosis not present

## 2023-12-11 DIAGNOSIS — E119 Type 2 diabetes mellitus without complications: Secondary | ICD-10-CM | POA: Diagnosis not present

## 2023-12-11 DIAGNOSIS — J441 Chronic obstructive pulmonary disease with (acute) exacerbation: Secondary | ICD-10-CM | POA: Diagnosis not present

## 2023-12-11 LAB — GLUCOSE, CAPILLARY
Glucose-Capillary: 58 mg/dL — ABNORMAL LOW (ref 70–99)
Glucose-Capillary: 124 mg/dL — ABNORMAL HIGH (ref 70–99)
Glucose-Capillary: 107 mg/dL — ABNORMAL HIGH (ref 70–99)
Glucose-Capillary: 227 mg/dL — ABNORMAL HIGH (ref 70–99)
Glucose-Capillary: 234 mg/dL — ABNORMAL HIGH (ref 70–99)
Glucose-Capillary: 234 mg/dL — ABNORMAL HIGH (ref 70–99)

## 2023-12-11 MED ORDER — BENZONATATE 100 MG PO CAPS: 200.0000 mg | ORAL_CAPSULE | Freq: Three times a day (TID) | ORAL | Status: DC | PRN

## 2023-12-11 MED ORDER — REVEFENACIN 175 MCG/3ML IN SOLN
175.0000 ug | Freq: Every day | RESPIRATORY_TRACT | Status: DC
  Administered 2023-12-11 – 2023-12-13 (×3): 175 ug via RESPIRATORY_TRACT
  Filled 2023-12-11 (×3): qty 3

## 2023-12-11 MED ORDER — ARFORMOTEROL TARTRATE 15 MCG/2ML IN NEBU
15.0000 ug | INHALATION_SOLUTION | Freq: Two times a day (BID) | RESPIRATORY_TRACT | Status: DC
  Administered 2023-12-11 – 2023-12-13 (×4): 15 ug via RESPIRATORY_TRACT
  Filled 2023-12-11 (×4): qty 2

## 2023-12-11 MED ORDER — LEVALBUTEROL HCL 0.63 MG/3ML IN NEBU
0.6300 mg | INHALATION_SOLUTION | Freq: Three times a day (TID) | RESPIRATORY_TRACT | Status: DC
  Administered 2023-12-11 – 2023-12-13 (×6): 0.63 mg via RESPIRATORY_TRACT
  Filled 2023-12-11 (×6): qty 3

## 2023-12-11 MED ORDER — PREDNISONE 20 MG PO TABS
40.0000 mg | ORAL_TABLET | Freq: Every day | ORAL | Status: DC
Start: 2023-12-12 — End: 2023-12-13
  Administered 2023-12-12 – 2023-12-13 (×2): 40 mg via ORAL
  Filled 2023-12-11 (×2): qty 2

## 2023-12-11 MED ORDER — MELATONIN 5 MG PO TABS
5.0000 mg | ORAL_TABLET | Freq: Every day | ORAL | Status: DC
  Administered 2023-12-11 – 2023-12-12 (×2): 5 mg via ORAL
  Filled 2023-12-11 (×2): qty 1

## 2023-12-11 MED ORDER — IPRATROPIUM BROMIDE 0.02 % IN SOLN
0.2500 mg | Freq: Three times a day (TID) | RESPIRATORY_TRACT | Status: DC
  Administered 2023-12-11 – 2023-12-13 (×6): 0.25 mg via RESPIRATORY_TRACT
  Filled 2023-12-11 (×6): qty 2.5

## 2023-12-11 NOTE — Plan of Care (Signed)
  Problem: Nutritional: Goal: Maintenance of adequate nutrition will improve Outcome: Progressing   Problem: Nutritional: Goal: Progress toward achieving an optimal weight will improve Outcome: Progressing   Problem: Skin Integrity: Goal: Risk for impaired skin integrity will decrease Outcome: Progressing   Problem: Tissue Perfusion: Goal: Adequacy of tissue perfusion will improve Outcome: Progressing

## 2023-12-11 NOTE — Progress Notes (Signed)
PROGRESS NOTE        PATIENT DETAILS Name: [name] Age: 68 y.o. Sex: female Date of Birth: [birth year] Admit Date: 12/07/2023 Admitting Physician John Giovanni, MD BJY:NWGN, Len Blalock, MD  Brief Summary: Patient is a 68 y.o.  female with history of COPD on chronic prednisone/3 L of oxygen at home, CAD s/p CABG, chronic HFpEF, HTN, HLD, DM-2 presented with shortness of breath-she was found to have acute on chronic hypoxic/hypercapnic respiratory failure due to COPD exacerbation due to coronavirus infection.  Significant events: 2/16>> SOB-COPD exacerbation-BiPAP-admit to [facility]. 2/17>> on/off BiPAP most of the day 2/18>> liberated off BiPAP-on 5-7 liters of oxygen.  Significant studies: 2/17>> CTA chest: No PE  Significant microbiology data: 2/16>> COVID/influenza/RSV PCR: Negative 2/16>> blood culture: Negative 2/17>> respiratory virus panel:+ve Corona virus OC43  Procedures: None  Consults: None  Subjective: Claims she had a rough night-anxious this morning-claiming that she gets anxious as she is not able to get the phlegm out.  Objective: Vitals: Blood pressure 136/62, pulse 74, temperature 98.9 F (37.2 C), temperature source Oral, resp. rate (!) 23, height 5\' 1"  (1.549 m), weight 86.2 kg, SpO2 94%.   Exam: Gen Exam:Alert awake-not in any distress HEENT:atraumatic, normocephalic Chest: Some scattered wheezing but mostly clear to auscultation-moving air well. CVS:S1S2 regular Abdomen:soft non tender, non distended Extremities:no edema Neurology: Non focal Skin: no rash  Pertinent Labs/Radiology:    Latest Ref Rng & Units 12/09/2023    5:00 AM 12/08/2023    1:45 AM 12/07/2023   10:34 PM  CBC  WBC 4.0 - 10.5 K/uL 13.1  14.6    Hemoglobin 12.0 - 15.0 g/dL 56.2  13.0  86.5   Hematocrit 36.0 - 46.0 % 41.9  43.1  48.0   Platelets 150 - 400 K/uL 212  209      Lab Results  Component Value Date   NA 140 12/09/2023   K 4.9  12/09/2023   CL 102 12/09/2023   CO2 31 12/09/2023      Assessment/Plan: Acute on chronic hypoxic/hypercarbic respiratory failure secondary to COPD exacerbation due to coronavirus infection. Overall slowly improving-continues to have some anxiety issues  Taper prednisone further Continue bronchodilators Mobilize Incentive spirometry/flutter valve. If clinical improvement-suspect home on 2/21.  Chronic HFpEF Euvolemic Lasix 20 mg daily Follow-up volume status closely.  CAD s/p CABG Currently without any anginal symptoms  HTN BP is stable-slightly higher this morning but she was anxious.  Subsequent readings are much better  Continue bisoprolol/Imdur.   DM-2 (A1c 7.7 on 08/29/2023) with steroid-induced hyperglycemia. CBGs stable Semglee 14 units twice daily Units of NovoLog with meals SSI Follow/adjust.   Recent Labs    12/11/23 0507 12/11/23 0809 12/11/23 1242  GLUCAP 124* 107* 227*    Class 2 Obesity: Estimated body mass index is 35.91 kg/m as calculated from the following:   Height as of this encounter: 5\' 1"  (1.549 m).   Weight as of this encounter: 86.2 kg.    Code status:   Code Status: Full Code   DVT Prophylaxis: enoxaparin (LOVENOX) injection 40 mg Start: 12/08/23 1000   Family Communication: Sister at bedside   Disposition Plan: Status is: Inpatient Remains inpatient appropriate because: Severity of illness   Planned Discharge Destination:Home health   Diet: Diet Order             Diet Heart  Room service appropriate? Yes; Fluid consistency: Thin  Diet effective now                     Antimicrobial agents: Anti-infectives (From admission, onward)    Start     Dose/Rate Route Frequency Ordered Stop   12/09/23 1030  azithromycin (ZITHROMAX) tablet 500 mg        500 mg Oral Daily 12/09/23 0943 12/10/23 0840   12/08/23 2300  cefTRIAXone (ROCEPHIN) 1 g in sodium chloride 0.9 % 100 mL IVPB  Status:  Discontinued        1 g 200  mL/hr over 30 Minutes Intravenous Every 24 hours 12/08/23 0046 12/09/23 1407   12/08/23 1400  doxycycline (VIBRAMYCIN) 100 mg in sodium chloride 0.9 % 250 mL IVPB  Status:  Discontinued        100 mg 125 mL/hr over 120 Minutes Intravenous Every 12 hours 12/08/23 0046 12/08/23 1328   12/08/23 1330  azithromycin (ZITHROMAX) 500 mg in sodium chloride 0.9 % 250 mL IVPB  Status:  Discontinued        500 mg 250 mL/hr over 60 Minutes Intravenous Every 24 hours 12/08/23 1328 12/09/23 0943   12/07/23 2300  cefTRIAXone (ROCEPHIN) 1 g in sodium chloride 0.9 % 100 mL IVPB        1 g 200 mL/hr over 30 Minutes Intravenous  Once 12/07/23 2259 12/08/23 0009   12/07/23 2300  doxycycline (VIBRAMYCIN) 100 mg in sodium chloride 0.9 % 250 mL IVPB        100 mg 125 mL/hr over 120 Minutes Intravenous  Once 12/07/23 2259 12/08/23 0329        MEDICATIONS: Scheduled Meds:  atorvastatin  40 mg Oral Daily   bisoprolol  5 mg Oral Daily   budesonide (PULMICORT) nebulizer solution  0.25 mg Nebulization BID   enoxaparin (LOVENOX) injection  40 mg Subcutaneous Q24H   furosemide  20 mg Oral Daily   guaiFENesin  600 mg Oral BID   insulin aspart  0-20 Units Subcutaneous TID WC   insulin aspart  0-5 Units Subcutaneous QHS   insulin aspart  6 Units Subcutaneous TID WC   insulin glargine-yfgn  10 Units Subcutaneous BID   ipratropium  0.5 mg Nebulization Q6H   isosorbide mononitrate  90 mg Oral Daily   levalbuterol  0.63 mg Nebulization Q6H   magnesium oxide  400 mg Oral Daily   predniSONE  60 mg Oral Q breakfast   Continuous Infusions:  albuterol Stopped (12/08/23 0058)   PRN Meds:.acetaminophen **OR** acetaminophen, hydrOXYzine, naLOXone (NARCAN)  injection   I have personally reviewed following labs and imaging studies  LABORATORY DATA: CBC: Recent Labs  Lab 12/07/23 2228 12/07/23 2234 12/08/23 0145 12/09/23 0500  WBC 21.7*  --  14.6* 13.1*  NEUTROABS 15.3*  --   --   --   HGB 14.8 16.3* 13.3 12.9   HCT 47.6* 48.0* 43.1 41.9  MCV 89.3  --  90.4 91.3  PLT 278  --  209 212    Basic Metabolic Panel: Recent Labs  Lab 12/07/23 2228 12/07/23 2234 12/08/23 0145 12/09/23 0500  NA 136 137 139 140  K 4.0 4.1 4.4 4.9  CL 95*  --  100 102  CO2 33*  --  27 31  GLUCOSE 339*  --  340* 266*  BUN 13  --  11 10  CREATININE 0.57  --  0.59 0.64  CALCIUM 8.9  --  8.1* 8.7*  GFR: Estimated Creatinine Clearance: 67.2 mL/min (by C-G formula based on SCr of 0.64 mg/dL).  Liver Function Tests: No results for input(s): "AST", "ALT", "ALKPHOS", "BILITOT", "PROT", "ALBUMIN" in the last 168 hours. No results for input(s): "LIPASE", "AMYLASE" in the last 168 hours. No results for input(s): "AMMONIA" in the last 168 hours.  Coagulation Profile: Recent Labs  Lab 12/07/23 2303  INR 1.0    Cardiac Enzymes: No results for input(s): "CKTOTAL", "CKMB", "CKMBINDEX", "TROPONINI" in the last 168 hours.  BNP (last 3 results) No results for input(s): "PROBNP" in the last 8760 hours.  Lipid Profile: No results for input(s): "CHOL", "HDL", "LDLCALC", "TRIG", "CHOLHDL", "LDLDIRECT" in the last 72 hours.  Thyroid Function Tests: No results for input(s): "TSH", "T4TOTAL", "FREET4", "T3FREE", "THYROIDAB" in the last 72 hours.  Anemia Panel: No results for input(s): "VITAMINB12", "FOLATE", "FERRITIN", "TIBC", "IRON", "RETICCTPCT" in the last 72 hours.  Urine analysis:    Component Value Date/Time   COLORURINE YELLOW 02/27/2023 0620   APPEARANCEUR HAZY (A) 02/27/2023 0620   LABSPEC 1.023 02/27/2023 0620   PHURINE 6.0 02/27/2023 0620   GLUCOSEU NEGATIVE 02/27/2023 0620   GLUCOSEU NEGATIVE 02/17/2023 1557   HGBUR NEGATIVE 02/27/2023 0620   BILIRUBINUR NEGATIVE 02/27/2023 0620   BILIRUBINUR negative 02/24/2023 0924   KETONESUR NEGATIVE 02/27/2023 0620   PROTEINUR 30 (A) 02/27/2023 0620   UROBILINOGEN 0.2 02/24/2023 0924   UROBILINOGEN 0.2 02/17/2023 1557   NITRITE NEGATIVE 02/27/2023 0620    LEUKOCYTESUR NEGATIVE 02/27/2023 0620    Sepsis Labs: Lactic Acid, Venous    Component Value Date/Time   LATICACIDVEN 0.8 12/09/2023 0500    MICROBIOLOGY: Recent Results (from the past 240 hours)  Resp panel by RT-PCR (RSV, Flu A&B, Covid) Anterior Nasal Swab     Status: None   Collection Time: 12/07/23 10:28 PM   Specimen: Anterior Nasal Swab  Result Value Ref Range Status   SARS Coronavirus 2 by RT PCR NEGATIVE NEGATIVE Final   Influenza A by PCR NEGATIVE NEGATIVE Final   Influenza B by PCR NEGATIVE NEGATIVE Final    Comment: (NOTE) The Xpert Xpress SARS-CoV-2/FLU/RSV plus assay is intended as an aid in the diagnosis of influenza from Nasopharyngeal swab specimens and should not be used as a sole basis for treatment. Nasal washings and aspirates are unacceptable for Xpert Xpress SARS-CoV-2/FLU/RSV testing.  Fact Sheet for Patients: BloggerCourse.com  Fact Sheet for Healthcare Providers: SeriousBroker.it  This test is not yet approved or cleared by the [place] FDA and has been authorized for detection and/or diagnosis of SARS-CoV-2 by FDA under an Emergency Use Authorization (EUA). This EUA will remain in effect (meaning this test can be used) for the duration of the COVID-19 declaration under Section 564(b)(1) of the Act, 21 U.S.C. section 360bbb-3(b)(1), unless the authorization is terminated or revoked.     Resp Syncytial Virus by PCR NEGATIVE NEGATIVE Final    Comment: (NOTE) Fact Sheet for Patients: BloggerCourse.com  Fact Sheet for Healthcare Providers: SeriousBroker.it  This test is not yet approved or cleared by the [place] FDA and has been authorized for detection and/or diagnosis of SARS-CoV-2 by FDA under an Emergency Use Authorization (EUA). This EUA will remain in effect (meaning this test can be used) for the duration of the COVID-19  declaration under Section 564(b)(1) of the Act, 21 U.S.C. section 360bbb-3(b)(1), unless the authorization is terminated or revoked.  Performed at [facility], [address], [place], [place] [address]   Blood Culture (routine x 2)  Status: None (Preliminary result)   Collection Time: 12/07/23 11:35 PM   Specimen: BLOOD  Result Value Ref Range Status   Specimen Description BLOOD BLOOD RIGHT HAND  Final   Special Requests AEROBIC BOTTLE ONLY Blood Culture adequate volume  Final   Culture   Final    NO GROWTH 3 DAYS Performed at [facility], [address] [place], [place] [address]    Report Status PENDING  Incomplete  Blood Culture (routine x 2)     Status: None (Preliminary result)   Collection Time: 12/07/23 11:35 PM   Specimen: BLOOD  Result Value Ref Range Status   Specimen Description BLOOD RIGHT ANTECUBITAL  Final   Special Requests   Final    BOTTLES DRAWN AEROBIC AND ANAEROBIC Blood Culture adequate volume   Culture   Final    NO GROWTH 3 DAYS Performed at [facility], [address] [place], [place] [address]    Report Status PENDING  Incomplete  Respiratory (~20 pathogens) panel by PCR     Status: Abnormal   Collection Time: 12/08/23  7:52 AM   Specimen: Nasopharyngeal Swab; Respiratory  Result Value Ref Range Status   Adenovirus NOT DETECTED NOT DETECTED Final   Coronavirus 229E NOT DETECTED NOT DETECTED Final    Comment: (NOTE) The Coronavirus on the Respiratory Panel, DOES NOT test for the novel  Coronavirus (2019 nCoV)    Coronavirus HKU1 NOT DETECTED NOT DETECTED Final   Coronavirus NL63 NOT DETECTED NOT DETECTED Final   Coronavirus OC43 DETECTED (A) NOT DETECTED Final   Metapneumovirus NOT DETECTED NOT DETECTED Final   Rhinovirus / Enterovirus NOT DETECTED NOT DETECTED Final   Influenza A NOT DETECTED NOT DETECTED Final   Influenza B NOT DETECTED NOT DETECTED Final   Parainfluenza Virus 1 NOT DETECTED NOT DETECTED Final    Parainfluenza Virus 2 NOT DETECTED NOT DETECTED Final   Parainfluenza Virus 3 NOT DETECTED NOT DETECTED Final   Parainfluenza Virus 4 NOT DETECTED NOT DETECTED Final   Respiratory Syncytial Virus NOT DETECTED NOT DETECTED Final   Bordetella pertussis NOT DETECTED NOT DETECTED Final   Bordetella Parapertussis NOT DETECTED NOT DETECTED Final   Chlamydophila pneumoniae NOT DETECTED NOT DETECTED Final   Mycoplasma pneumoniae NOT DETECTED NOT DETECTED Final    Comment: Performed at [facility], [address], [place], [place] [address]    RADIOLOGY STUDIES/RESULTS: No results found.    LOS: 4 days   Jeoffrey Massed, MD  [facility]    To contact the attending provider between 7A-7P or the covering provider during after hours 7P-7A, please log into the web site [email or web address] and access using universal [facility] password for that web site. If you do not have the password, please call the hospital operator.  12/11/2023, 1:48 PM

## 2023-12-11 NOTE — Progress Notes (Signed)
Physical Therapy Treatment Patient Details Name: [name] MRN: [id number] DOB: 18-Feb-1956 Today's Date: 12/11/2023   History of Present Illness 68 y.o.  female presented 2/16 with shortness of breath, she was found to have acute on chronic hypoxic/hypercapnic respiratory failure due to COPD exacerbation due to coronavirus infection. PMHx: COPD on chronic prednisone/3 L of oxygen at home, CAD s/p CABG, chronic HFpEF, HTN, HLD, DM-2    PT Comments  Pt tolerated treatment well today. Pt today was able to progress ambulation today in room with RW at supervision level. Pt declined hallway ambulation. SpO2 remained between 90-93% on 4L. No change in DC/DME recs at this time. PT will continue to follow.     If plan is discharge home, recommend the following: Assistance with cooking/housework;Assist for transportation   Can travel by private vehicle        Equipment Recommendations  Other (comment) (May benefit from rollator - pending improvement in respiratory status.)    Recommendations for Other Services       Precautions / Restrictions Precautions Precautions: None Recall of Precautions/Restrictions: Intact Precaution/Restrictions Comments: Monitor O2. Restrictions Weight Bearing Restrictions Per Provider Order: No     Mobility  Bed Mobility               General bed mobility comments: sitting in chair    Transfers Overall transfer level: Modified independent Equipment used: Rolling walker (2 wheels)               General transfer comment: Good power up, RW provided for safety. No LOB noted.    Ambulation/Gait Ambulation/Gait assistance: Supervision Gait Distance (Feet): 25 Feet Assistive device: Rolling walker (2 wheels) Gait Pattern/deviations: Step-through pattern, Decreased stride length Gait velocity: decr     General Gait Details: Grossly stable for short distance, pt requesting to stay in room and declined hallway navigation. SpO2 90-93% on 4L  supplemental O2. No overt LOB or buckling noted. Feels more secure with RW for support, managed lines/leads. Supervision for safety in room.   Stairs             Wheelchair Mobility     Tilt Bed    Modified Rankin (Stroke Patients Only)       Balance Overall balance assessment: Mild deficits observed, not formally tested                                          Communication Communication Communication: No apparent difficulties  Cognition Arousal: Alert Behavior During Therapy: WFL for tasks assessed/performed   PT - Cognitive impairments: No apparent impairments                         Following commands: Intact      Cueing Cueing Techniques: Verbal cues  Exercises      General Comments        Pertinent Vitals/Pain Pain Assessment Pain Assessment: Faces Faces Pain Scale: Hurts little more Pain Location: back - reports chronic Pain Descriptors / Indicators: Aching Pain Intervention(s): Monitored during session    Home Living                          Prior Function            PT Goals (current goals can now be found in the care plan section) Acute  Rehab PT Goals Patient Stated Goal: Get well PT Goal Formulation: With patient Time For Goal Achievement: 12/23/23 Potential to Achieve Goals: Good Progress towards PT goals: Progressing toward goals    Frequency    Min 1X/week      PT Plan      Co-evaluation              AM-PAC PT "6 Clicks" Mobility   Outcome Measure  Help needed turning from your back to your side while in a flat bed without using bedrails?: None Help needed moving from lying on your back to sitting on the side of a flat bed without using bedrails?: None Help needed moving to and from a bed to a chair (including a wheelchair)?: A Little Help needed standing up from a chair using your arms (e.g., wheelchair or bedside chair)?: A Little Help needed to walk in hospital room?: A  Little Help needed climbing 3-5 steps with a railing? : A Little 6 Click Score: 20    End of Session Equipment Utilized During Treatment: Gait belt;Oxygen Activity Tolerance: Other (comment) (Increased WOB, anxious) Patient left: in chair;with call bell/phone within reach;with chair alarm set;with family/visitor present Nurse Communication: Mobility status (Increased WOB) PT Visit Diagnosis: Difficulty in walking, not elsewhere classified (R26.2)     Time: 1610-9604 PT Time Calculation (min) (ACUTE ONLY): 12 min  Charges:    $Gait Training: 8-22 mins PT General Charges $$ ACUTE PT VISIT: 1 Visit                     Shela Nevin, PT, DPT Acute Rehab Services [phone]    [name] 12/11/2023, 4:15 PM

## 2023-12-12 DIAGNOSIS — J9621 Acute and chronic respiratory failure with hypoxia: Secondary | ICD-10-CM | POA: Diagnosis not present

## 2023-12-12 DIAGNOSIS — E119 Type 2 diabetes mellitus without complications: Secondary | ICD-10-CM | POA: Diagnosis not present

## 2023-12-12 DIAGNOSIS — J441 Chronic obstructive pulmonary disease with (acute) exacerbation: Secondary | ICD-10-CM | POA: Diagnosis not present

## 2023-12-12 DIAGNOSIS — I10 Essential (primary) hypertension: Secondary | ICD-10-CM | POA: Diagnosis not present

## 2023-12-12 LAB — GLUCOSE, CAPILLARY
Glucose-Capillary: 134 mg/dL — ABNORMAL HIGH (ref 70–99)
Glucose-Capillary: 164 mg/dL — ABNORMAL HIGH (ref 70–99)
Glucose-Capillary: 381 mg/dL — ABNORMAL HIGH (ref 70–99)
Glucose-Capillary: 215 mg/dL — ABNORMAL HIGH (ref 70–99)

## 2023-12-12 MED ORDER — ACETYLCYSTEINE 20 % IN SOLN
4.0000 mL | Freq: Two times a day (BID) | RESPIRATORY_TRACT | Status: DC
  Administered 2023-12-12 – 2023-12-13 (×3): 4 mL via RESPIRATORY_TRACT
  Filled 2023-12-12 (×4): qty 4

## 2023-12-12 NOTE — Progress Notes (Signed)
PROGRESS NOTE        PATIENT DETAILS Name: [name] Age: 68 y.o. Sex: female Date of Birth: 25-Oct-1955 Admit Date: 12/07/2023 Admitting Physician John Giovanni, MD JWJ:XBJY, Len Blalock, MD  Brief Summary: Patient is a 68 y.o.  female with history of COPD on chronic prednisone/3 L of oxygen at home, CAD s/p CABG, chronic HFpEF, HTN, HLD, DM-2 presented with shortness of breath-she was found to have acute on chronic hypoxic/hypercapnic respiratory failure due to COPD exacerbation due to coronavirus infection.  Significant events: 2/16>> SOB-COPD exacerbation-BiPAP-admit to [facility]. 2/17>> on/off BiPAP most of the day 2/18>> liberated off BiPAP-on 5-7 liters of oxygen.  Significant studies: 2/17>> CTA chest: No PE  Significant microbiology data: 2/16>> COVID/influenza/RSV PCR: Negative 2/16>> blood culture: Negative 2/17>> respiratory virus panel:+ve Corona virus OC43  Procedures: None  Consults: None  Subjective: Continues to have anxiety spells because she is unable to cough out the phlegm which she feels is stuck in her chest.  Objective: Vitals: Blood pressure (!) 163/79, pulse (!) 53, temperature 98.5 F (36.9 C), temperature source Axillary, resp. rate 19, height 5\' 1"  (1.549 m), weight 86.2 kg, SpO2 96%.   Exam: Gen Exam:Alert awake-not in any distress HEENT:atraumatic, normocephalic Chest: Diminished air entry at bilateral bases but otherwise clear to auscultation. CVS:S1S2 regular Abdomen:soft non tender, non distended Extremities:no edema Neurology: Non focal Skin: no rash  Pertinent Labs/Radiology:    Latest Ref Rng & Units 12/09/2023    5:00 AM 12/08/2023    1:45 AM 12/07/2023   10:34 PM  CBC  WBC 4.0 - 10.5 K/uL 13.1  14.6    Hemoglobin 12.0 - 15.0 g/dL 78.2  95.6  21.3   Hematocrit 36.0 - 46.0 % 41.9  43.1  48.0   Platelets 150 - 400 K/uL 212  209      Lab Results  Component Value Date   NA 140 12/09/2023   K 4.9  12/09/2023   CL 102 12/09/2023   CO2 31 12/09/2023      Assessment/Plan: Acute on chronic hypoxic/hypercarbic respiratory failure secondary to COPD exacerbation due to coronavirus infection. Continues to slowly improve-stable on 3-4 L of oxygen (on 3 L at baseline) Slow prednisone taper Continue bronchodilators Continues to have issues, bringing up phlegm which is provoking her anxiety quite a bit-adding Mucomyst nebs-continue incentive spirometry/flutter valve-mobilize with nursing staff/PT/OT.  Continue Mucinex. Hopefully if she is more stable-we can consider discharge home in the next day or so.  Chronic HFpEF Euvolemic Lasix 20 mg daily Follow-up volume status closely.  CAD s/p CABG Currently without any anginal symptoms  HTN BP fluctuating quite a bit-this is mostly from anxiety Continue bisoprolol/Imdur.    DM-2 (A1c 7.7 on 08/29/2023) with steroid-induced hyperglycemia. CBGs stable Semglee 14 units twice daily 6 Units of NovoLog with meals SSI Follow/adjust.   Recent Labs    12/11/23 1645 12/11/23 2055 12/12/23 0822  GLUCAP 234* 234* 134*    Class 2 Obesity: Estimated body mass index is 35.91 kg/m as calculated from the following:   Height as of this encounter: 5\' 1"  (1.549 m).   Weight as of this encounter: 86.2 kg.    Code status:   Code Status: Full Code   DVT Prophylaxis: enoxaparin (LOVENOX) injection 40 mg Start: 12/08/23 1000   Family Communication: Sister at bedside   Disposition Plan: Status is:  Inpatient Remains inpatient appropriate because: Severity of illness   Planned Discharge Destination:Home health   Diet: Diet Order             Diet Heart Room service appropriate? Yes; Fluid consistency: Thin  Diet effective now                     Antimicrobial agents: Anti-infectives (From admission, onward)    Start     Dose/Rate Route Frequency Ordered Stop   12/09/23 1030  azithromycin (ZITHROMAX) tablet 500 mg        500  mg Oral Daily 12/09/23 0943 12/10/23 0840   12/08/23 2300  cefTRIAXone (ROCEPHIN) 1 g in sodium chloride 0.9 % 100 mL IVPB  Status:  Discontinued        1 g 200 mL/hr over 30 Minutes Intravenous Every 24 hours 12/08/23 0046 12/09/23 1407   12/08/23 1400  doxycycline (VIBRAMYCIN) 100 mg in sodium chloride 0.9 % 250 mL IVPB  Status:  Discontinued        100 mg 125 mL/hr over 120 Minutes Intravenous Every 12 hours 12/08/23 0046 12/08/23 1328   12/08/23 1330  azithromycin (ZITHROMAX) 500 mg in sodium chloride 0.9 % 250 mL IVPB  Status:  Discontinued        500 mg 250 mL/hr over 60 Minutes Intravenous Every 24 hours 12/08/23 1328 12/09/23 0943   12/07/23 2300  cefTRIAXone (ROCEPHIN) 1 g in sodium chloride 0.9 % 100 mL IVPB        1 g 200 mL/hr over 30 Minutes Intravenous  Once 12/07/23 2259 12/08/23 0009   12/07/23 2300  doxycycline (VIBRAMYCIN) 100 mg in sodium chloride 0.9 % 250 mL IVPB        100 mg 125 mL/hr over 120 Minutes Intravenous  Once 12/07/23 2259 12/08/23 0329        MEDICATIONS: Scheduled Meds:  acetylcysteine  4 mL Nebulization BID   arformoterol  15 mcg Nebulization BID   atorvastatin  40 mg Oral Daily   bisoprolol  5 mg Oral Daily   budesonide (PULMICORT) nebulizer solution  0.25 mg Nebulization BID   enoxaparin (LOVENOX) injection  40 mg Subcutaneous Q24H   furosemide  20 mg Oral Daily   guaiFENesin  600 mg Oral BID   insulin aspart  0-20 Units Subcutaneous TID WC   insulin aspart  0-5 Units Subcutaneous QHS   insulin aspart  6 Units Subcutaneous TID WC   insulin glargine-yfgn  10 Units Subcutaneous BID   levalbuterol  0.63 mg Nebulization TID   And   ipratropium  0.25 mg Nebulization TID   isosorbide mononitrate  90 mg Oral Daily   magnesium oxide  400 mg Oral Daily   melatonin  5 mg Oral QHS   predniSONE  40 mg Oral Q breakfast   revefenacin  175 mcg Nebulization Daily   Continuous Infusions:   PRN Meds:.acetaminophen **OR** acetaminophen, benzonatate,  hydrOXYzine, naLOXone (NARCAN)  injection   I have personally reviewed following labs and imaging studies  LABORATORY DATA: CBC: Recent Labs  Lab 12/07/23 2228 12/07/23 2234 12/08/23 0145 12/09/23 0500  WBC 21.7*  --  14.6* 13.1*  NEUTROABS 15.3*  --   --   --   HGB 14.8 16.3* 13.3 12.9  HCT 47.6* 48.0* 43.1 41.9  MCV 89.3  --  90.4 91.3  PLT 278  --  209 212    Basic Metabolic Panel: Recent Labs  Lab 12/07/23 2228 12/07/23 2234 12/08/23 0145  12/09/23 0500  NA 136 137 139 140  K 4.0 4.1 4.4 4.9  CL 95*  --  100 102  CO2 33*  --  27 31  GLUCOSE 339*  --  340* 266*  BUN 13  --  11 10  CREATININE 0.57  --  0.59 0.64  CALCIUM 8.9  --  8.1* 8.7*    GFR: Estimated Creatinine Clearance: 67.2 mL/min (by C-G formula based on SCr of 0.64 mg/dL).  Liver Function Tests: No results for input(s): "AST", "ALT", "ALKPHOS", "BILITOT", "PROT", "ALBUMIN" in the last 168 hours. No results for input(s): "LIPASE", "AMYLASE" in the last 168 hours. No results for input(s): "AMMONIA" in the last 168 hours.  Coagulation Profile: Recent Labs  Lab 12/07/23 2303  INR 1.0    Cardiac Enzymes: No results for input(s): "CKTOTAL", "CKMB", "CKMBINDEX", "TROPONINI" in the last 168 hours.  BNP (last 3 results) No results for input(s): "PROBNP" in the last 8760 hours.  Lipid Profile: No results for input(s): "CHOL", "HDL", "LDLCALC", "TRIG", "CHOLHDL", "LDLDIRECT" in the last 72 hours.  Thyroid Function Tests: No results for input(s): "TSH", "T4TOTAL", "FREET4", "T3FREE", "THYROIDAB" in the last 72 hours.  Anemia Panel: No results for input(s): "VITAMINB12", "FOLATE", "FERRITIN", "TIBC", "IRON", "RETICCTPCT" in the last 72 hours.  Urine analysis:    Component Value Date/Time   COLORURINE YELLOW 02/27/2023 0620   APPEARANCEUR HAZY (A) 02/27/2023 0620   LABSPEC 1.023 02/27/2023 0620   PHURINE 6.0 02/27/2023 0620   GLUCOSEU NEGATIVE 02/27/2023 0620   GLUCOSEU NEGATIVE 02/17/2023  1557   HGBUR NEGATIVE 02/27/2023 0620   BILIRUBINUR NEGATIVE 02/27/2023 0620   BILIRUBINUR negative 02/24/2023 0924   KETONESUR NEGATIVE 02/27/2023 0620   PROTEINUR 30 (A) 02/27/2023 0620   UROBILINOGEN 0.2 02/24/2023 0924   UROBILINOGEN 0.2 02/17/2023 1557   NITRITE NEGATIVE 02/27/2023 0620   LEUKOCYTESUR NEGATIVE 02/27/2023 0620    Sepsis Labs: Lactic Acid, Venous    Component Value Date/Time   LATICACIDVEN 0.8 12/09/2023 0500    MICROBIOLOGY: Recent Results (from the past 240 hours)  Resp panel by RT-PCR (RSV, Flu A&B, Covid) Anterior Nasal Swab     Status: None   Collection Time: 12/07/23 10:28 PM   Specimen: Anterior Nasal Swab  Result Value Ref Range Status   SARS Coronavirus 2 by RT PCR NEGATIVE NEGATIVE Final   Influenza A by PCR NEGATIVE NEGATIVE Final   Influenza B by PCR NEGATIVE NEGATIVE Final    Comment: (NOTE) The Xpert Xpress SARS-CoV-2/FLU/RSV plus assay is intended as an aid in the diagnosis of influenza from Nasopharyngeal swab specimens and should not be used as a sole basis for treatment. Nasal washings and aspirates are unacceptable for Xpert Xpress SARS-CoV-2/FLU/RSV testing.  Fact Sheet for Patients: BloggerCourse.com  Fact Sheet for Healthcare Providers: SeriousBroker.it  This test is not yet approved or cleared by the [place] FDA and has been authorized for detection and/or diagnosis of SARS-CoV-2 by FDA under an Emergency Use Authorization (EUA). This EUA will remain in effect (meaning this test can be used) for the duration of the COVID-19 declaration under Section 564(b)(1) of the Act, 21 U.S.C. section 360bbb-3(b)(1), unless the authorization is terminated or revoked.     Resp Syncytial Virus by PCR NEGATIVE NEGATIVE Final    Comment: (NOTE) Fact Sheet for Patients: BloggerCourse.com  Fact Sheet for Healthcare  Providers: SeriousBroker.it  This test is not yet approved or cleared by the [place] FDA and has been authorized for detection and/or diagnosis of SARS-CoV-2  by FDA under an Emergency Use Authorization (EUA). This EUA will remain in effect (meaning this test can be used) for the duration of the COVID-19 declaration under Section 564(b)(1) of the Act, 21 U.S.C. section 360bbb-3(b)(1), unless the authorization is terminated or revoked.  Performed at [facility], [address], [place], [place] [address]   Blood Culture (routine x 2)     Status: None (Preliminary result)   Collection Time: 12/07/23 11:35 PM   Specimen: BLOOD  Result Value Ref Range Status   Specimen Description BLOOD BLOOD RIGHT HAND  Final   Special Requests AEROBIC BOTTLE ONLY Blood Culture adequate volume  Final   Culture   Final    NO GROWTH 4 DAYS Performed at [facility], [address], [place], [place] [address]    Report Status PENDING  Incomplete  Blood Culture (routine x 2)     Status: None (Preliminary result)   Collection Time: 12/07/23 11:35 PM   Specimen: BLOOD  Result Value Ref Range Status   Specimen Description BLOOD RIGHT ANTECUBITAL  Final   Special Requests   Final    BOTTLES DRAWN AEROBIC AND ANAEROBIC Blood Culture adequate volume   Culture   Final    NO GROWTH 4 DAYS Performed at [facility], [address] [place], [place] [address]    Report Status PENDING  Incomplete  Respiratory (~20 pathogens) panel by PCR     Status: Abnormal   Collection Time: 12/08/23  7:52 AM   Specimen: Nasopharyngeal Swab; Respiratory  Result Value Ref Range Status   Adenovirus NOT DETECTED NOT DETECTED Final   Coronavirus 229E NOT DETECTED NOT DETECTED Final    Comment: (NOTE) The Coronavirus on the Respiratory Panel, DOES NOT test for the novel  Coronavirus (2019 nCoV)    Coronavirus HKU1 NOT DETECTED NOT DETECTED Final   Coronavirus NL63 NOT  DETECTED NOT DETECTED Final   Coronavirus OC43 DETECTED (A) NOT DETECTED Final   Metapneumovirus NOT DETECTED NOT DETECTED Final   Rhinovirus / Enterovirus NOT DETECTED NOT DETECTED Final   Influenza A NOT DETECTED NOT DETECTED Final   Influenza B NOT DETECTED NOT DETECTED Final   Parainfluenza Virus 1 NOT DETECTED NOT DETECTED Final   Parainfluenza Virus 2 NOT DETECTED NOT DETECTED Final   Parainfluenza Virus 3 NOT DETECTED NOT DETECTED Final   Parainfluenza Virus 4 NOT DETECTED NOT DETECTED Final   Respiratory Syncytial Virus NOT DETECTED NOT DETECTED Final   Bordetella pertussis NOT DETECTED NOT DETECTED Final   Bordetella Parapertussis NOT DETECTED NOT DETECTED Final   Chlamydophila pneumoniae NOT DETECTED NOT DETECTED Final   Mycoplasma pneumoniae NOT DETECTED NOT DETECTED Final    Comment: Performed at [facility], [address] [place], [place] [address]    RADIOLOGY STUDIES/RESULTS: No results found.    LOS: 5 days   Jeoffrey Massed, MD  [facility]    To contact the attending provider between 7A-7P or the covering provider during after hours 7P-7A, please log into the web site [email or web address] and access using universal [place] password for that web site. If you do not have the password, please call the hospital operator.  12/12/2023, 11:44 AM

## 2023-12-12 NOTE — Progress Notes (Signed)
Mobility Specialist: Progress Note   12/12/23 1608  Mobility  Activity Ambulated with assistance in hallway  Level of Assistance Standby assist, set-up cues, supervision of patient - no hands on  Assistive Device Front wheel walker  Distance Ambulated (ft) 300 ft  Activity Response Tolerated well  Mobility Referral Yes  Mobility visit 1 Mobility  Mobility Specialist Start Time (ACUTE ONLY) 1509  Mobility Specialist Stop Time (ACUTE ONLY) 1532  Mobility Specialist Time Calculation (min) (ACUTE ONLY) 23 min    During Mobility: SpO2 89-91% 4LO2  Pt was agreeable to mobility session - received in chair. SV throughout. NO complaints about ambulation but stated she had concerns about going home and was experiencing some memory loss from certain medications. SpO2 WFL on 4LO2. Returned to room without fault. Left in chair with all needs met, call bell in reach. Sister in room.   Maurene Capes Mobility Specialist Please contact via SecureChat or Rehab office at [phone]

## 2023-12-12 NOTE — Progress Notes (Addendum)
Mobility Specialist: Progress Note   12/12/23 1218  Mobility  Activity Ambulated with assistance in hallway  Level of Assistance Standby assist, set-up cues, supervision of patient - no hands on  Assistive Device Front wheel walker  Distance Ambulated (ft) 180 ft  Activity Response Tolerated well  Mobility Referral Yes  Mobility visit 1 Mobility  Mobility Specialist Start Time (ACUTE ONLY) 1003  Mobility Specialist Stop Time (ACUTE ONLY) 1028  Mobility Specialist Time Calculation (min) (ACUTE ONLY) 25 min    Pre-Mobility: SpO2 90-92% 4.5LO2 During Mobility: SpO2 90-91% 6LO2 Post-Mobility: SpO2 92% 4.5LO2  Pt was agreeable to mobility session - received on EOB. SV throughout. No complaints. VSS on 5LO2 throughout. Returned to room without fault. Left on EOB with all needs met, call bell in reach. Sister in room.   Maurene Capes Mobility Specialist Please contact via SecureChat or Rehab office at [phone]

## 2023-12-13 ENCOUNTER — Other Ambulatory Visit (HOSPITAL_COMMUNITY): Payer: Self-pay

## 2023-12-13 DIAGNOSIS — J9621 Acute and chronic respiratory failure with hypoxia: Secondary | ICD-10-CM | POA: Diagnosis not present

## 2023-12-13 DIAGNOSIS — J9622 Acute and chronic respiratory failure with hypercapnia: Secondary | ICD-10-CM | POA: Diagnosis not present

## 2023-12-13 LAB — CULTURE, BLOOD (ROUTINE X 2)
Culture: NO GROWTH
Culture: NO GROWTH
Special Requests: ADEQUATE
Special Requests: ADEQUATE

## 2023-12-13 LAB — GLUCOSE, CAPILLARY: Glucose-Capillary: 97 mg/dL (ref 70–99)

## 2023-12-13 MED ORDER — INSULIN ASPART 100 UNIT/ML FLEXPEN
PEN_INJECTOR | SUBCUTANEOUS | 0 refills | Status: DC
  Filled 2023-12-13: qty 15, 90d supply, fill #0

## 2023-12-13 MED ORDER — PREDNISONE 10 MG PO TABS
ORAL_TABLET | ORAL | 0 refills | Status: AC
  Filled 2023-12-13: qty 20, 5d supply, fill #0

## 2023-12-13 MED ORDER — INSULIN GLARGINE-YFGN 100 UNIT/ML ~~LOC~~ SOPN
15.0000 [IU] | PEN_INJECTOR | Freq: Every day | SUBCUTANEOUS | 0 refills | Status: DC
  Filled 2023-12-13: qty 3, 20d supply, fill #0

## 2023-12-13 MED ORDER — INSUPEN PEN NEEDLES 31G X 8 MM MISC
0 refills | Status: DC
  Filled 2023-12-13: qty 100, 25d supply, fill #0

## 2023-12-13 NOTE — Progress Notes (Signed)
 Occupational Therapy Treatment Patient Details Name: [name] MRN: [id number] DOB: 12/25/1955 Today's Date: 12/13/2023   History of present illness 68 y.o.  female presented 2/16 with shortness of breath, she was found to have acute on chronic hypoxic/hypercapnic respiratory failure due to COPD exacerbation due to coronavirus infection. PMHx: COPD on chronic prednisone/3 L of oxygen at home, CAD s/p CABG, chronic HFpEF, HTN, HLD, DM-2   OT comments  Pt. Seen for skilled OT treatment session with sister present.  Provided and reviewed the 5P's handout.  Examples provided for each for how to modify ADLs for increasing safety and independence with ADLs while maintaining good breathing strategies also.  Energy conservation education incorporated also.  Pt. And sister receptive to all education and both provided good return demo for PLB and verbalized they found it helpful.  Eager for d/c home later today when ready.        If plan is discharge home, recommend the following:  A little help with walking and/or transfers;A little help with bathing/dressing/bathroom;Assistance with cooking/housework;Assist for transportation;Help with stairs or ramp for entrance   Equipment Recommendations  None recommended by OT    Recommendations for Other Services      Precautions / Restrictions Precautions Precautions: None Recall of Precautions/Restrictions: Intact Precaution/Restrictions Comments: Monitor O2.       Mobility Bed Mobility               General bed mobility comments: sitting in chair for duration of session    Transfers                         Balance                                           ADL either performed or assessed with clinical judgement   ADL Overall ADL's : Needs assistance/impaired                                       General ADL Comments: 5ps handout provided and reviewed with pt. and pts. sister.  energy  conservation examples provided and reivewed also. pt. with good return demo of PLB and really felt it helped. discussed its benefits for anxiety management also when breathing feeling short or limited.    Extremity/Trunk Assessment              Vision       Restaurant manager, fast food Communication: No apparent difficulties   Cognition Arousal: Alert Behavior During Therapy: WFL for tasks assessed/performed Cognition: No apparent impairments                               Following commands: Intact        Cueing   Cueing Techniques: Verbal cues  Exercises      Shoulder Instructions       General Comments      Pertinent Vitals/ Pain       Pain Assessment Pain Assessment: No/denies pain  Home Living  Prior Functioning/Environment              Frequency  Min 1X/week        Progress Toward Goals  OT Goals(current goals can now be found in the care plan section)  Progress towards OT goals: Progressing toward goals     Plan      Co-evaluation                 AM-PAC OT "6 Clicks" Daily Activity     Outcome Measure   Help from another person eating meals?: None Help from another person taking care of personal grooming?: None Help from another person toileting, which includes using toliet, bedpan, or urinal?: A Little Help from another person bathing (including washing, rinsing, drying)?: A Little Help from another person to put on and taking off regular upper body clothing?: A Little Help from another person to put on and taking off regular lower body clothing?: A Little 6 Click Score: 20    End of Session Equipment Utilized During Treatment: Oxygen  OT Visit Diagnosis: Unsteadiness on feet (R26.81);Muscle weakness (generalized) (M62.81)   Activity Tolerance Patient tolerated treatment well   Patient Left in chair;with call bell/phone  within reach;with family/visitor present   Nurse Communication Other (comment) (spoke with RN via phone as pt./sister had question of when pharmacy would deliver meds so they could d/c home. rn reviewed she does not have that information, i relayed that to pt. and sister no set time for delivery of meds available)        Time: 1021-1039 OT Time Calculation (min): 18 min  Charges: OT General Charges $OT Visit: 1 Visit OT Treatments $Self Care/Home Management : 8-22 mins  Boneta Lucks, COTA/L Acute Rehabilitation [phone]   Alessandra Bevels Lorraine-COTA/L 12/13/2023, 11:29 AM

## 2023-12-13 NOTE — Discharge Summary (Signed)
 [name] WGN:[id number] DOB: 1956-04-17 DOA: 12/07/2023  PCP: Corwin Levins, MD  Admit date: 12/07/2023  Discharge date: 12/13/2023  Admitted From: Home   Disposition:  Home   Recommendations for Outpatient Follow-up:   Follow up with PCP in 1-2 weeks  PCP Please obtain BMP/CBC, 2 view CXR in 1week,  (see Discharge instructions)   PCP Please follow up on the following pending results:    Home Health: None   Equipment/Devices: None  Consultations: None Discharge Condition: Stable    CODE STATUS: Full    Diet Recommendation: Heart Healthy Low Carb    Chief Complaint  Patient presents with   Respiratory Distress     Brief history of present illness from the day of admission and additional interim summary     68 y.o.  female with history of COPD on chronic prednisone/3 L of oxygen at home, CAD s/p CABG, chronic HFpEF, HTN, HLD, DM-2 presented with shortness of breath-she was found to have acute on chronic hypoxic/hypercapnic respiratory failure due to COPD exacerbation due to coronavirus infection.   Significant events: 2/16>> SOB-COPD exacerbation-BiPAP-admit to [facility]. 2/17>> on/off BiPAP most of the day 2/18>> liberated off BiPAP-on 5-7 liters of oxygen.   Significant studies: 2/17>> CTA chest: No PE   Significant microbiology data: 2/16>> COVID/influenza/RSV PCR: Negative 2/16>> blood culture: Negative 2/17>> respiratory virus panel:+ve Corona virus [facility] Course   Acute on chronic hypoxic/hypercarbic respiratory failure secondary to COPD exacerbation due to coronavirus infection. Continues to slowly improve-stable on 3-4 L of oxygen (on 3 L at baseline) Slow prednisone taper, chronically on 10 mg of prednisone, also smokes  strictly counseled to quit smoking, She was treated with supportive care which included I-S and flutter valve, high-dose steroids, now tapered down, now back to 3 L nasal cannula oxygen in daytime which is her baseline along with nighttime CPAP for sleep apnea, currently symptom-free on 3 L, does have a huge element of anxiety, will be discharged home with close outpatient PCP and pulmonary follow-up on a oral steroid taper.   Chronic HFpEF Euvolemic Lasix 20 mg daily Follow-up volume status closely.   CAD s/p CABG Currently without any anginal symptoms   HTN BP well, continue home medications     Class 2 Obesity: Estimated body mass index is 35.91-follow-up with PCP for weight loss.  DM-2 (A1c 7.7 on 08/29/2023) with steroid-induced hyperglycemia.  Sugars upon admission were 250, according to the sister they check her sugars at home and they have been running above 200 at all times, she takes Trulicity few times a month at home, likely is developing DM type II steroid-induced, placed on insulin  here, requested to check CBGs q. [facility] and follow-up with PCP.  PCP to check A1c and monitor sugars closely.   Discharge diagnosis     Principal Problem:   Acute on chronic respiratory failure with hypoxia and hypercapnia (HCC) Active Problems:   COPD with acute exacerbation (HCC)   Non-insulin dependent type 2 diabetes mellitus (HCC)   Lactic acidosis   Essential hypertension   CAD (coronary artery disease)   CHF (congestive heart failure) (HCC)   Hyperlipidemia   Vitamin D deficiency    Discharge instructions    Discharge Instructions     Ambulatory Referral for Lung Cancer Scre   Complete by: As directed    Discharge instructions   Complete by: As directed    Follow with Primary MD Corwin Levins, MD in 7 days   Get CBC, CMP, 2 view Chest X ray -  checked next visit with your primary MD   Activity: As tolerated with Full fall precautions use walker/cane & assistance as  needed  Disposition Home    Diet: Heart Healthy - low carbohydrate.  Accuchecks 4 times/day, Once in AM empty stomach and then before each meal. Log in all results and show them to your Prim.MD in 3 days. If any glucose reading is under 80 or above 300 call your Prim MD immidiately. Follow Low glucose instructions for glucose under 80 as instructed.   Special Instructions: If you have smoked or chewed Tobacco  in the last 2 yrs please stop smoking, stop any regular Alcohol  and or any Recreational drug use.  On your next visit with your primary care physician please Get Medicines reviewed and adjusted.  Please request your Prim.MD to go over all Hospital Tests and Procedure/Radiological results at the follow up, please get all Hospital records sent to your Prim MD by signing hospital release before you go home.  If you experience worsening of your admission symptoms, develop shortness of breath, life threatening emergency, suicidal or homicidal thoughts you must seek medical attention immediately by calling 911 or calling your MD immediately  if symptoms less severe.  You Must read complete instructions/literature along with all the possible adverse reactions/side effects for all the Medicines you take and that have been prescribed to you. Take any new Medicines after you have completely understood and accpet all the possible adverse reactions/side effects.   Do not drive when taking Pain medications.  Do not take more than prescribed Pain, Sleep and Anxiety Medications   Increase activity slowly   Complete by: As directed        Discharge Medications   Allergies as of 12/13/2023       Reactions   Chantix [varenicline] Nausea And Vomiting   Augmentin [amoxicillin-pot Clavulanate] Nausea And Vomiting        Medication List     TAKE these medications    acetaminophen 500 MG tablet Commonly known as: TYLENOL Take 1,000 mg by mouth 2 (two) times daily as needed for headache,  fever, moderate pain or mild pain.   albuterol (2.5 MG/3ML) 0.083% nebulizer solution Commonly known as: PROVENTIL Take 3 mLs (2.5 mg total) by nebulization every 6 (six) hours as needed for wheezing or shortness of breath.   albuterol 108 (90 Base) MCG/ACT inhaler Commonly known as: VENTOLIN HFA INHALE 1-2 PUFFS BY MOUTH EVERY 6 HOURS AS NEEDED FOR WHEEZE OR SHORTNESS OF BREATH   amLODipine 5 MG tablet Commonly known as: NORVASC TAKE 1 TABLET (5 MG TOTAL) BY  MOUTH DAILY.   atorvastatin 40 MG tablet Commonly known as: LIPITOR Take 1 tablet (40 mg total) by mouth daily.   bisoprolol 5 MG tablet Commonly known as: ZEBETA TAKE 1 TABLET (5 MG TOTAL) BY MOUTH DAILY.   Dupixent 300 MG/2ML Soaj Generic drug: Dupilumab Inject 300 mg into the skin every 14 (fourteen) days.   fluticasone 50 MCG/ACT nasal spray Commonly known as: FLONASE Place 1 spray into both nostrils daily.   furosemide 20 MG tablet Commonly known as: LASIX Take 20 mg by mouth 3 (three) times a week.   insulin aspart 100 UNIT/ML FlexPen Commonly known as: NOVOLOG Before each meal 3 times a day, 140-199 - 4 units, 200-250 - 6 units, 251-299 -8 units,  300-349 -12 units,  350 or above 14 units.   insulin glargine-yfgn 100 UNIT/ML Pen Commonly known as: SEMGLEE Inject 15 Units into the skin daily.   Insulin Syringe-Needle U-100 25G X 1" 1 ML Misc For 4 times a day insulin SQ, 1 month supply. Diagnosis E11.65   ipratropium-albuterol 0.5-2.5 (3) MG/3ML Soln Commonly known as: DUONEB INHALE 3 ML BY NEBULIZER EVERY 6 HOURS AS NEEDED   isosorbide mononitrate 60 MG 24 hr tablet Commonly known as: IMDUR TAKE 1.5 TABLETS (90 MG TOTAL) BY MOUTH DAILY.   Klor-Con M10 10 MEQ tablet Generic drug: potassium chloride TAKE 1 TABLET BY MOUTH EVERY DAY What changed: when to take this   MAGNESIUM OXIDE PO Take 1 tablet by mouth at bedtime.   metFORMIN 500 MG 24 hr tablet Commonly known as: GLUCOPHAGE-XR TAKE 2  TABLET BY MOUTH EVERY DAY WITH BREAKFAST   Multivitamin Women 50+ Tabs Take 1 tablet by mouth in the morning.   predniSONE 10 MG tablet Commonly known as: DELTASONE Label  & dispense according to the schedule below. 4 Pills PO for 3 days then, 3 Pills PO for 3 days, 2 Pills PO for 3 days, then continue 10 mg daily prednisone indefinitely as you were doing before. What changed:  how much to take how to take this when to take this reasons to take this additional instructions   Trelegy Ellipta 200-62.5-25 MCG/ACT Aepb Generic drug: Fluticasone-Umeclidin-Vilant INHALE 1 PUFF BY MOUTH EVERY DAY   Trulicity 1.5 MG/0.5ML Soaj Generic drug: Dulaglutide Inject 1.5 mg into the skin once a week.   VITAMIN B-12 PO Take 1 tablet by mouth in the morning.   VITAMIN D-3 PO Take 1 capsule by mouth in the morning.         Follow-up Information     Corwin Levins, MD. Schedule an appointment as soon as possible for a visit in 1 week(s).   Specialties: Internal Medicine, Radiology Contact information: [address] [place] [address] [phone]         Kalman Shan, MD Follow up.   Specialty: Pulmonary Disease Contact information: [address] [place] [address] [phone]                 Major procedures and Radiology Reports - PLEASE review detailed and final reports thoroughly  -      DG Chest Port 1V same Day Result Date: 12/09/2023 CLINICAL DATA:  Shortness of breath. EXAM: PORTABLE CHEST 1 VIEW COMPARISON:  December 07, 2023. FINDINGS: Stable cardiomediastinal silhouette. Status post coronary bypass graft. Lungs are clear. Bony thorax is unremarkable. IMPRESSION: No active disease. Electronically Signed   By: Lupita Raider M.D.   On: 12/09/2023 09:57   CT  Angio Chest Pulmonary Embolism (PE) W or WO Contrast Result Date: 12/08/2023 CLINICAL DATA:  Pulmonary embolism suspected. Shortness of breath and chest pain with cough EXAM: CT  ANGIOGRAPHY CHEST WITH CONTRAST TECHNIQUE: Multidetector CT imaging of the chest was performed using the standard protocol during bolus administration of intravenous contrast. Multiplanar CT image reconstructions and MIPs were obtained to evaluate the vascular anatomy. RADIATION DOSE REDUCTION: This exam was performed according to the departmental dose-optimization program which includes automated exposure control, adjustment of the mA and/or kV according to patient size and/or use of iterative reconstruction technique. CONTRAST:  50mL OMNIPAQUE IOHEXOL 350 MG/ML SOLN COMPARISON:  01/28/2020 FINDINGS: Cardiovascular: Satisfactory opacification of the pulmonary arteries to the segmental level. No evidence of pulmonary embolism. Normal heart size. No pericardial effusion. Scattered atheromatous calcification. CABG. Mediastinum/Nodes: No mass or worrisome lymph node enlargement when compared to prior. Unremarkable esophagus Lungs/Pleura: Degraded by motion artifact. There is no edema, consolidation, effusion, or pneumothorax. Pleural calcification posteriorly on the right which could be from old inflammation or trauma, stable from prior. Mild pulmonary scarring. Upper Abdomen: No acute finding Musculoskeletal: Remote T10 superior endplate fracture. Generalized spondylosis. Review of the MIP images confirms the above findings. IMPRESSION: Negative for pulmonary embolism or other acute finding. Electronically Signed   By: Tiburcio Pea M.D.   On: 12/08/2023 04:25   DG Chest Port 1 View Result Date: 12/07/2023 CLINICAL DATA:  Shortness of breath. EXAM: PORTABLE CHEST 1 VIEW COMPARISON:  02/24/2023 FINDINGS: Prior median sternotomy and CABG.The cardiomediastinal contours are stable, heart size upper normal. Subsegmental atelectasis or scarring in the left mid lung. Minimal ill-defined patchy opacity at the right lung base. No pleural fluid or pneumothorax. No acute osseous abnormalities are seen. IMPRESSION: 1.  Minimal ill-defined patchy opacity at the right lung base, may represent atelectasis or pneumonia. 2. Subsegmental atelectasis or scarring in the left mid lung. Electronically Signed   By: Narda Rutherford M.D.   On: 12/07/2023 22:43    Micro Results    Recent Results (from the past 240 hours)  Resp panel by RT-PCR (RSV, Flu A&B, Covid) Anterior Nasal Swab     Status: None   Collection Time: 12/07/23 10:28 PM   Specimen: Anterior Nasal Swab  Result Value Ref Range Status   SARS Coronavirus 2 by RT PCR NEGATIVE NEGATIVE Final   Influenza A by PCR NEGATIVE NEGATIVE Final   Influenza B by PCR NEGATIVE NEGATIVE Final    Comment: (NOTE) The Xpert Xpress SARS-CoV-2/FLU/RSV plus assay is intended as an aid in the diagnosis of influenza from Nasopharyngeal swab specimens and should not be used as a sole basis for treatment. Nasal washings and aspirates are unacceptable for Xpert Xpress SARS-CoV-2/FLU/RSV testing.  Fact Sheet for Patients: BloggerCourse.com  Fact Sheet for Healthcare Providers: SeriousBroker.it  This test is not yet approved or cleared by the [place] FDA and has been authorized for detection and/or diagnosis of SARS-CoV-2 by FDA under an Emergency Use Authorization (EUA). This EUA will remain in effect (meaning this test can be used) for the duration of the COVID-19 declaration under Section 564(b)(1) of the Act, 21 U.S.C. section 360bbb-3(b)(1), unless the authorization is terminated or revoked.     Resp Syncytial Virus by PCR NEGATIVE NEGATIVE Final    Comment: (NOTE) Fact Sheet for Patients: BloggerCourse.com  Fact Sheet for Healthcare Providers: SeriousBroker.it  This test is not yet approved or cleared by the [place] FDA and has been authorized for detection and/or diagnosis of SARS-CoV-2 by  FDA under an Emergency Use Authorization (EUA). This EUA  will remain in effect (meaning this test can be used) for the duration of the COVID-19 declaration under Section 564(b)(1) of the Act, 21 U.S.C. section 360bbb-3(b)(1), unless the authorization is terminated or revoked.  Performed at [facility], [address] [place], [place] [address]   Blood Culture (routine x 2)     Status: None   Collection Time: 12/07/23 11:35 PM   Specimen: BLOOD  Result Value Ref Range Status   Specimen Description BLOOD BLOOD RIGHT HAND  Final   Special Requests AEROBIC BOTTLE ONLY Blood Culture adequate volume  Final   Culture   Final    NO GROWTH 5 DAYS Performed at [facility], [address], [place], [place] [address]    Report Status 12/13/2023 FINAL  Final  Blood Culture (routine x 2)     Status: None   Collection Time: 12/07/23 11:35 PM   Specimen: BLOOD  Result Value Ref Range Status   Specimen Description BLOOD RIGHT ANTECUBITAL  Final   Special Requests   Final    BOTTLES DRAWN AEROBIC AND ANAEROBIC Blood Culture adequate volume   Culture   Final    NO GROWTH 5 DAYS Performed at [facility], [address], [place], [place] [address]    Report Status 12/13/2023 FINAL  Final  Respiratory (~20 pathogens) panel by PCR     Status: Abnormal   Collection Time: 12/08/23  7:52 AM   Specimen: Nasopharyngeal Swab; Respiratory  Result Value Ref Range Status   Adenovirus NOT DETECTED NOT DETECTED Final   Coronavirus 229E NOT DETECTED NOT DETECTED Final    Comment: (NOTE) The Coronavirus on the Respiratory Panel, DOES NOT test for the novel  Coronavirus (2019 nCoV)    Coronavirus HKU1 NOT DETECTED NOT DETECTED Final   Coronavirus NL63 NOT DETECTED NOT DETECTED Final   Coronavirus OC43 DETECTED (A) NOT DETECTED Final   Metapneumovirus NOT DETECTED NOT DETECTED Final   Rhinovirus / Enterovirus NOT DETECTED NOT DETECTED Final   Influenza A NOT DETECTED NOT DETECTED Final   Influenza B NOT DETECTED NOT DETECTED Final    Parainfluenza Virus 1 NOT DETECTED NOT DETECTED Final   Parainfluenza Virus 2 NOT DETECTED NOT DETECTED Final   Parainfluenza Virus 3 NOT DETECTED NOT DETECTED Final   Parainfluenza Virus 4 NOT DETECTED NOT DETECTED Final   Respiratory Syncytial Virus NOT DETECTED NOT DETECTED Final   Bordetella pertussis NOT DETECTED NOT DETECTED Final   Bordetella Parapertussis NOT DETECTED NOT DETECTED Final   Chlamydophila pneumoniae NOT DETECTED NOT DETECTED Final   Mycoplasma pneumoniae NOT DETECTED NOT DETECTED Final    Comment: Performed at [facility], [address], [place], [place] [address]    Today   Subjective    Lakeya Mulka today has no headache,no chest abdominal pain,no new weakness tingling or numbness, feels much better wants to go home today.    Objective   Blood pressure (!) 163/69, pulse (!) 51, temperature 98.1 F (36.7 C), temperature source Oral, resp. rate 17, height 5\' 1"  (1.549 m), weight 86.2 kg, SpO2 96%.  No intake or output data in the 24 hours ending 12/13/23 0953  Exam  Awake Alert, No new F.N deficits, mildly anxious   [place].AT,PERRAL Supple Neck,   Symmetrical Chest wall movement, Mod air movement bilaterally, CTAB RRR,No Gallops,   +ve B.Sounds, Abd Soft, Non tender,  No Cyanosis, Clubbing or edema  Data Review   Recent Labs  Lab 12/07/23 2228 12/07/23 2234 12/08/23 0145 12/09/23 0500  WBC 21.7*  --  14.6* 13.1*  HGB 14.8 16.3* 13.3 12.9  HCT 47.6* 48.0* 43.1 41.9  PLT 278  --  209 212  MCV 89.3  --  90.4 91.3  MCH 27.8  --  27.9 28.1  MCHC 31.1  --  30.9 30.8  RDW 13.8  --  13.7 14.6  LYMPHSABS 4.5*  --   --   --   MONOABS 1.6*  --   --   --   EOSABS 0.0  --   --   --   BASOSABS 0.1  --   --   --     Recent Labs  Lab 12/07/23 2228 12/07/23 2234 12/07/23 2303 12/07/23 2341 12/08/23 0145 12/08/23 0151 12/08/23 0748 12/09/23 0500  NA 136 137  --   --  139  --   --  140  K 4.0 4.1  --   --  4.4  --   --  4.9  CL 95*  --    --   --  100  --   --  102  CO2 33*  --   --   --  27  --   --  31  ANIONGAP 8  --   --   --  12  --   --  7  GLUCOSE 339*  --   --   --  340*  --   --  266*  BUN 13  --   --   --  11  --   --  10  CREATININE 0.57  --   --   --  0.59  --   --  0.64  DDIMER  --   --   --   --  0.91*  --   --   --   PROCALCITON  --   --   --   --  <0.10  --   --   --   LATICACIDVEN  --   --   --  2.0*  --  2.5* 2.2* 0.8  INR  --   --  1.0  --   --   --   --   --   BNP 133.0*  --   --   --   --   --   --   --   CALCIUM 8.9  --   --   --  8.1*  --   --  8.7*    Total Time in preparing paper work, data evaluation and todays exam - 35 minutes  Signature  -    Susa Raring M.D on 12/13/2023 at 9:53 AM   -  To page go to [email or web address]

## 2023-12-13 NOTE — Discharge Instructions (Signed)
 Follow with Primary MD Corwin Levins, MD in 7 days   Get CBC, CMP, 2 view Chest X ray -  checked next visit with your primary MD   Activity: As tolerated with Full fall precautions use walker/cane & assistance as needed  Disposition Home    Diet: Heart Healthy - low carbohydrate.  Accuchecks 4 times/day, Once in AM empty stomach and then before each meal. Log in all results and show them to your Prim.MD in 3 days. If any glucose reading is under 80 or above 300 call your Prim MD immidiately. Follow Low glucose instructions for glucose under 80 as instructed.   Special Instructions: If you have smoked or chewed Tobacco  in the last 2 yrs please stop smoking, stop any regular Alcohol  and or any Recreational drug use.  On your next visit with your primary care physician please Get Medicines reviewed and adjusted.  Please request your Prim.MD to go over all Hospital Tests and Procedure/Radiological results at the follow up, please get all Hospital records sent to your Prim MD by signing hospital release before you go home.  If you experience worsening of your admission symptoms, develop shortness of breath, life threatening emergency, suicidal or homicidal thoughts you must seek medical attention immediately by calling 911 or calling your MD immediately  if symptoms less severe.  You Must read complete instructions/literature along with all the possible adverse reactions/side effects for all the Medicines you take and that have been prescribed to you. Take any new Medicines after you have completely understood and accpet all the possible adverse reactions/side effects.   Do not drive when taking Pain medications.  Do not take more than prescribed Pain, Sleep and Anxiety Medications

## 2023-12-15 ENCOUNTER — Telehealth: Payer: Self-pay | Admitting: *Deleted

## 2023-12-15 NOTE — Transitions of Care (Post Inpatient/ED Visit) (Signed)
 12/15/2023  Name: [name] MRN: [id number] DOB: 07-20-1956  Today's TOC FU Call Status: Today's TOC FU Call Status:: Successful TOC FU Call Completed TOC FU Call Complete Date: 12/15/23 Patient's Name and Date of Birth confirmed.  Transition Care Management Follow-up Telephone Call Date of Discharge: 12/13/23 Discharge Facility: [facility]) Type of Discharge: Inpatient Admission Primary Inpatient Discharge Diagnosis:: Acute on chronic respiratory failure with hypercapnea and hypoxia; COVID How have you been since you were released from the hospital?: Better ("I am better, doing fine.  I am not taking any of this insulin until I talk to Dr. Jonny Ruiz on Wednesday.  I know my sugar is only high because of the prednisone.  No one at the hospital even told me how to take it.  My sugars have been much better at home.") Any questions or concerns?: Yes Patient Questions/Concerns:: Patient reports she was not provided any instruction on how to administer insulin at home- she is currently not taking; wants to talk to PCP prior to starting this medication Patient Questions/Concerns Addressed: Notified Provider of Patient Questions/Concerns, Other: (provided education around difference between long and short acting insulin; reviewed recent blood sugars at home post-hospital discharge on 12/13/23)  Patient reports blood sugars at home as: 129 this morning and 188 during phone call; she reports "one over 200-- it was 258 after I ate dinner last night"  Items Reviewed: Did you receive and understand the discharge instructions provided?: Yes (thoroughly reviewed with patient who verbalizes good understanding of same) Medications obtained,verified, and reconciled?: Yes (Medications Reviewed) (Full medication reconciliation/ review completed; concerns/ discrepancies identified; confirmed patient obtained/ but is not taking all newly Rx'd insulin as instructed; self-manages medications and denies  questions/ concerns around medications today) Any new allergies since your discharge?: No Dietary orders reviewed?: Yes Type of Diet Ordered:: "Low sugar and carbs, heart heathy" Do you have support at home?: Yes People in Home: sibling(s) Name of Support/Comfort Primary Source: Reports independent in self-care activities; resides with supportive sister who assists as/ if needed/ indicated  Medications Reviewed Today: Medications Reviewed Today     Reviewed by Michaela Corner, RN (Registered Nurse) on 12/15/23 at 1509  Med List Status: <None>   Medication Order Taking? Sig Documenting Provider Last Dose Status Informant  acetaminophen (TYLENOL) 500 MG tablet [id number] Yes Take 1,000 mg by mouth 2 (two) times daily as needed for headache, fever, moderate pain or mild pain. [provider] Taking Active Self, Pharmacy Records  albuterol (PROVENTIL) (2.5 MG/3ML) 0.083% nebulizer solution [id number] Yes Take 3 mLs (2.5 mg total) by nebulization every 6 (six) hours as needed for wheezing or shortness of breath. Oretha Milch, MD Taking Active Self, Pharmacy Records           Med Note [facility] Dec 08, 2023  3:27 AM)    albuterol (VENTOLIN HFA) 108 (90 Base) MCG/ACT inhaler [id number] Yes INHALE 1-2 PUFFS BY MOUTH EVERY 6 HOURS AS NEEDED FOR WHEEZE OR SHORTNESS OF Gordy Savers, MD Taking Active Self, Pharmacy Records  amLODipine (NORVASC) 5 MG tablet [id number] Yes TAKE 1 TABLET (5 MG TOTAL) BY MOUTH DAILY. Jake Bathe, MD Taking Active Self, Pharmacy Records  atorvastatin (LIPITOR) 40 MG tablet [id number] Yes Take 1 tablet (40 mg total) by mouth daily. Corwin Levins, MD Taking Active Self, Pharmacy Records  bisoprolol (ZEBETA) 5 MG tablet [id number] Yes TAKE 1 TABLET (5 MG TOTAL) BY MOUTH DAILY. Jake Bathe,  MD Taking Active Self, Pharmacy Records  Cholecalciferol (VITAMIN D-3 PO) [id number] Yes Take 1 capsule by mouth in the morning. [provider] Taking  Active Self, Pharmacy Records  Cyanocobalamin (VITAMIN B-12 PO) [id number] Yes Take 1 tablet by mouth in the morning. [provider] Taking Active Self, Pharmacy Records  Dulaglutide (TRULICITY) 1.5 MG/0.5ML Ivory Broad [id number] Yes Inject 1.5 mg into the skin once a week. Corwin Levins, MD Taking Active Self, Pharmacy Records  Dupilumab (DUPIXENT) 300 MG/2ML Ivory Broad [id number] Yes Inject 300 mg into the skin every 14 (fourteen) days. Oretha Milch, MD Taking Active Self, Pharmacy Records  fluticasone [facility]) 50 MCG/ACT nasal spray [id number] Yes Place 1 spray into both nostrils daily. Tomi Bamberger, PA-C Taking Active Self, Pharmacy Records  Fluticasone-Umeclidin-Vilant [facility] ELLIPTA) 200-62.5-25 MCG/ACT AEPB [id number] Yes INHALE 1 PUFF BY MOUTH EVERY DAY Oretha Milch, MD Taking Active Self, Pharmacy Records  furosemide (LASIX) 20 MG tablet [id number] Yes Take 20 mg by mouth 3 (three) times a week. [provider] Taking Active Self, Pharmacy Records  insulin aspart (NOVOLOG) 100 UNIT/ML FlexPen [id number] No Before each meal, inject subcutaneously 3 times a day based on blood sugar reading: 140-199 - inject 4 units, 200-250 - inject 6 units, 251-299 - inject 8 units,  300-349 - inject 12 units,  350 or above - inject 14 units.  Patient not taking: Reported on 12/15/2023   Leroy Sea, MD Not Taking Active            Med Note Marilu Favre Dec 15, 2023  3:09 PM) 12/15/23: Reports during [facility] call, she has obtained this medication but is not yet taking: wants to talk to PCP on 12/17/23 during HFU OV before she starts taking:  reports her blood sugars have improved at home post-discharge; reports she did not receive instruction in using insulin prior to hospital discharge  insulin glargine-yfgn (SEMGLEE) 100 UNIT/ML Pen [id number] No Inject 15 Units into the skin daily.  Patient not taking: Reported on 12/15/2023   Leroy Sea, MD Not Taking Active            Med Note  Marilu Favre Dec 15, 2023  3:09 PM) 12/15/23: Reports during [facility] call, she has obtained this medication but is not yet taking: wants to talk to PCP on 12/17/23 during HFU OV before she starts taking:  reports her blood sugars have improved at home post-discharge; reports she did not receive instruction in using insulin prior to hospital discharge   Insulin Pen Needle (INSUPEN PEN NEEDLES) 31G X 8 MM MISC [id number] No Use to inject insulin 4 (four) times daily.  Patient not taking: Reported on 12/15/2023   Leroy Sea, MD Not Taking Active            Med Note Marilu Favre Dec 15, 2023  3:09 PM) 12/15/23: Reports during [facility] call, she has obtained needles for insulin but is not yet taking: wants to talk to PCP on 12/17/23 during HFU OV before she starts taking:  reports her blood sugars have improved at home post-discharge; reports she did not receive instruction in using insulin prior to hospital discharge   ipratropium-albuterol (DUONEB) 0.5-2.5 (3) MG/3ML SOLN [id number] Yes INHALE 3 ML BY NEBULIZER EVERY 6 HOURS AS NEEDED Oretha Milch, MD Taking Active Self, Pharmacy Records  isosorbide mononitrate (IMDUR) 60 MG 24 hr tablet [id number] Yes TAKE 1.5 TABLETS (90 MG TOTAL)  BY MOUTH DAILY. Jake Bathe, MD Taking Active Self, Pharmacy Records  KLOR-CON M10 10 MEQ tablet [id number] Yes TAKE 1 TABLET BY MOUTH EVERY DAY  Patient taking differently: Take 10 mEq by mouth 3 (three) times a week.   Corwin Levins, MD Taking Active Self, Pharmacy Records  MAGNESIUM OXIDE PO [id number] Yes Take 1 tablet by mouth at bedtime. [provider] Taking Active Self, Pharmacy Records  metFORMIN (GLUCOPHAGE-XR) 500 MG 24 hr tablet [id number] Yes TAKE 2 TABLET BY MOUTH EVERY DAY WITH BREAKFAST Corwin Levins, MD Taking Active Self, Pharmacy Records  Multiple Vitamins-Minerals (MULTIVITAMIN WOMEN 50+) TABS [id number] Yes Take 1 tablet by mouth in the morning. [provider] Taking  Active Self, Pharmacy Records  predniSONE (DELTASONE) 10 MG tablet [id number] Yes Take 4 tablets (40 mg) by mouth daily for 3 days, then take 3 tablets (30 mg) daily for 3 days, then take 2 tablets (20 mg) daily for 2 days, then resume 1 tablet (10 mg) daily Leroy Sea, MD Taking Active            Home Care and Equipment/Supplies: Were Home Health Services Ordered?: No Any new equipment or medical supplies ordered?: No  Functional Questionnaire: Do you need assistance with bathing/showering or dressing?: No Do you need assistance with meal preparation?: No Do you need assistance with eating?: No Do you have difficulty maintaining continence: No Do you need assistance with getting out of bed/getting out of a chair/moving?: No Do you have difficulty managing or taking your medications?: No  Follow up appointments reviewed: PCP Follow-up appointment confirmed?: Yes Date of PCP follow-up appointment?: 12/17/23 Follow-up Provider: PCP- Dr. Jonny Ruiz Specialist [facility] Follow-up appointment confirmed?: Yes Date of Specialist follow-up appointment?: 12/30/23 Follow-Up Specialty Provider:: pulmonary provider Do you need transportation to your follow-up appointment?: No Do you understand care options if your condition(s) worsen?: Yes-patient verbalized understanding  SDOH Interventions Today    Flowsheet Row Most Recent Value  SDOH Interventions   Food Insecurity Interventions Intervention Not Indicated  Housing Interventions Intervention Not Indicated  Transportation Interventions Intervention Not Indicated  [sister provides all transportation]  Utilities Interventions Intervention Not Indicated      Interventions Today    Flowsheet Row Most Recent Value  Chronic Disease   Chronic disease during today's visit Diabetes, Other, Chronic Obstructive Pulmonary Disease (COPD)  [COVID]  General Interventions   General Interventions Discussed/Reviewed General Interventions Discussed,  Durable Medical Equipment (DME), Doctor Visits, Communication with  Doctor Visits Discussed/Reviewed Doctor Visits Discussed, PCP, Specialist  Durable Medical Equipment (DME) Other, Oxygen  [confirmed not currently requiring/ using assistive devices for ambulation,  3 L O2 at baseline]  PCP/Specialist Visits Compliance with follow-up visit  Communication with PCP/Specialists  Education Interventions   Education Provided Provided Education  Provided Verbal Education On Medication, Insurance Plans, Blood Sugar Monitoring  [basic use of insulin at home/ difference between long and short acting insulin,  need to continue monitoring- recording of blood sugars at home and to take with her to upcoming PCP appointment]  Nutrition Interventions   Nutrition Discussed/Reviewed Nutrition Discussed  Pharmacy Interventions   Pharmacy Dicussed/Reviewed Pharmacy Topics Discussed  [Full medication review with updating medication list in EHR per patient report]  Safety Interventions   Safety Discussed/Reviewed Safety Discussed, Fall Risk  [provided education/ reinforcement around fall prevention]      TOC Interventions Today    Flowsheet Row Most Recent Value  TOC Interventions   TOC Interventions Discussed/Reviewed TOC Interventions Discussed  [  Patient declines need for ongoing/ further care management outreach,  declines enrollment in 30-day TOC program,  provided my direct contact information should questions/ concerns/ needs arise post-TOC call]      Total time spent from review to signing of note/ including any care coordination interventions:  61 minutes  Caryl Pina, RN, BSN, Media planner  Transitions of Care  [facility] - Population Health  [place] [phone]: direct office

## 2023-12-16 ENCOUNTER — Other Ambulatory Visit (HOSPITAL_COMMUNITY): Payer: Self-pay

## 2023-12-17 ENCOUNTER — Encounter: Payer: Self-pay | Admitting: Internal Medicine

## 2023-12-17 ENCOUNTER — Other Ambulatory Visit: Payer: Self-pay

## 2023-12-17 ENCOUNTER — Ambulatory Visit: Payer: Medicare Other | Admitting: Internal Medicine

## 2023-12-17 ENCOUNTER — Other Ambulatory Visit (HOSPITAL_COMMUNITY): Payer: Self-pay

## 2023-12-17 VITALS — BP 120/66 | HR 67 | Temp 99.5°F | Ht 61.0 in | Wt 191.0 lb

## 2023-12-17 DIAGNOSIS — E119 Type 2 diabetes mellitus without complications: Secondary | ICD-10-CM

## 2023-12-17 DIAGNOSIS — J9622 Acute and chronic respiratory failure with hypercapnia: Secondary | ICD-10-CM | POA: Diagnosis not present

## 2023-12-17 DIAGNOSIS — J4489 Other specified chronic obstructive pulmonary disease: Secondary | ICD-10-CM | POA: Diagnosis not present

## 2023-12-17 DIAGNOSIS — J9621 Acute and chronic respiratory failure with hypoxia: Secondary | ICD-10-CM | POA: Diagnosis not present

## 2023-12-17 DIAGNOSIS — Z794 Long term (current) use of insulin: Secondary | ICD-10-CM

## 2023-12-17 NOTE — Progress Notes (Signed)
 Patient ID: [name], female   DOB: 12-01-1955, 68 y.o.   MRN: [id number]        Chief Complaint: follow up copd exacerbation, acute hypox resp failure, chf feb 16 - 22 2025       HPI:  [name] is a 68 y.o. female here overall much improved following prednisone taper.  Sugars ramin 160 - 200 per pt, but was not shown how to give herself insulin so has not been taking.  Pt denies chest pain, increased sob or doe, wheezing, orthopnea, PND, increased LE swelling, palpitations, dizziness or syncope.   Pt denies polydipsia, polyuria, or new focal neuro s/s.    Pt denies fever, wt loss, night sweats, loss of appetite, or other constitutional symptoms        Transitional Care Management elements noted today: 1)  Date of D/C: as above 2)  Medication reconciliation:  done today at end visit 3)  Review of D/C summary or other information:  done today 4)  Review of need for f/u on pending diagnostic tests and treatments:  done today 5)  Review of need for Interaction with other providers who will assume or resume care of pt specific problems: done today 6)  Education of patient/family/guardian or caregiver: done today  Wt Readings from Last 3 Encounters:  12/17/23 191 lb (86.6 kg)  12/07/23 190 lb 0.6 oz (86.2 kg)  10/22/23 190 lb (86.2 kg)   BP Readings from Last 3 Encounters:  12/17/23 120/66  12/13/23 (!) 163/69  10/22/23 (!) 145/64         Past Medical History:  Diagnosis Date   Bronchitis    CAD (coronary artery disease)    a. 02/2002: CABG x3 with LIMA to LAD, RIMA to RCA, and SVG to OM   CHF (congestive heart failure) (HCC)    COPD (chronic obstructive pulmonary disease) (HCC)    Diabetes (HCC)    Headache    Heart disease    Hyperlipidemia    Hypertension    Hypertensive emergency 07/01/2013   Impaired glucose tolerance 08/20/2014   Sinusitis    Past Surgical History:  Procedure Laterality Date   CORONARY ARTERY BYPASS GRAFT  2003   Triple bypass    ESOPHAGOGASTRODUODENOSCOPY Left 06/13/2014   Procedure: ESOPHAGOGASTRODUODENOSCOPY (EGD);  Surgeon: Charna Elizabeth, MD;  Location: [facility];  Service: Endoscopy;  Laterality: Left;   RIGID ESOPHAGOSCOPY N/A 06/12/2014   Procedure: RIGID ESOPHAGOSCOPY WITH FOREIGN BODY REMOVAL;  Surgeon: Darletta Moll, MD;  Location: [facility] OR;  Service: ENT;  Laterality: N/A;    reports that she has been smoking cigarettes. She started smoking about 52 years ago. She has a 23.7 pack-year smoking history. She has never used smokeless tobacco. She reports current alcohol use of about 5.0 - 6.0 standard drinks of alcohol per week. She reports that she does not currently use drugs after having used the following drugs: Marijuana. family history includes Brain cancer in her mother; Cancer in her maternal grandmother and another family member; Heart disease in her mother; Lung cancer in her maternal aunt and paternal uncle; Stroke in an other family member; Suicidality in her brother. Allergies  Allergen Reactions   Chantix [Varenicline] Nausea And Vomiting   Augmentin [Amoxicillin-Pot Clavulanate] Nausea And Vomiting   Current Outpatient Medications on File Prior to Visit  Medication Sig Dispense Refill   acetaminophen (TYLENOL) 500 MG tablet Take 1,000 mg by mouth 2 (two) times daily as needed for headache, fever, moderate pain  or mild pain.     albuterol (PROVENTIL) (2.5 MG/3ML) 0.083% nebulizer solution Take 3 mLs (2.5 mg total) by nebulization every 6 (six) hours as needed for wheezing or shortness of breath. 75 mL 12   albuterol (VENTOLIN HFA) 108 (90 Base) MCG/ACT inhaler INHALE 1-2 PUFFS BY MOUTH EVERY 6 HOURS AS NEEDED FOR WHEEZE OR SHORTNESS OF BREATH 18 each 5   alendronate (FOSAMAX) 70 MG tablet Take 70 mg by mouth once a week.     amLODipine (NORVASC) 5 MG tablet TAKE 1 TABLET (5 MG TOTAL) BY MOUTH DAILY. 90 tablet 3   atorvastatin (LIPITOR) 40 MG tablet Take 1 tablet (40 mg total) by mouth daily. 90 tablet 3    bisoprolol (ZEBETA) 5 MG tablet TAKE 1 TABLET (5 MG TOTAL) BY MOUTH DAILY. 90 tablet 2   Cholecalciferol (VITAMIN D-3 PO) Take 1 capsule by mouth in the morning.     Cyanocobalamin (VITAMIN B-12 PO) Take 1 tablet by mouth in the morning.     Dulaglutide (TRULICITY) 1.5 MG/0.5ML SOAJ Inject 1.5 mg into the skin once a week. 6 mL 3   Dupilumab (DUPIXENT) 300 MG/2ML SOAJ Inject 300 mg into the skin every 14 (fourteen) days. 12 mL 1   fluticasone (FLONASE) 50 MCG/ACT nasal spray Place 1 spray into both nostrils daily. 15.8 mL 0   Fluticasone-Umeclidin-Vilant (TRELEGY ELLIPTA) 200-62.5-25 MCG/ACT AEPB INHALE 1 PUFF BY MOUTH EVERY DAY 60 each 11   furosemide (LASIX) 20 MG tablet Take 20 mg by mouth 3 (three) times a week.     insulin aspart (NOVOLOG) 100 UNIT/ML FlexPen Before each meal, inject subcutaneously 3 times a day based on blood sugar reading: 140-199 - inject 4 units, 200-250 - inject 6 units, 251-299 - inject 8 units,  300-349 - inject 12 units,  350 or above - inject 14 units. 15 mL 0   insulin glargine-yfgn (SEMGLEE) 100 UNIT/ML Pen Inject 15 Units into the skin daily. 6 mL 0   Insulin Pen Needle (INSUPEN PEN NEEDLES) 31G X 8 MM MISC Use to inject insulin 4 (four) times daily. 100 each 0   ipratropium-albuterol (DUONEB) 0.5-2.5 (3) MG/3ML SOLN INHALE 3 ML BY NEBULIZER EVERY 6 HOURS AS NEEDED 360 mL 11   isosorbide mononitrate (IMDUR) 60 MG 24 hr tablet TAKE 1.5 TABLETS (90 MG TOTAL) BY MOUTH DAILY. 135 tablet 3   KLOR-CON M10 10 MEQ tablet TAKE 1 TABLET BY MOUTH EVERY DAY (Patient taking differently: Take 10 mEq by mouth 3 (three) times a week.) 90 tablet 1   MAGNESIUM OXIDE PO Take 1 tablet by mouth at bedtime.     metFORMIN (GLUCOPHAGE-XR) 500 MG 24 hr tablet TAKE 2 TABLET BY MOUTH EVERY DAY WITH BREAKFAST 180 tablet 3   Multiple Vitamins-Minerals (MULTIVITAMIN WOMEN 50+) TABS Take 1 tablet by mouth in the morning.     predniSONE (DELTASONE) 10 MG tablet Take 4 tablets (40 mg) by mouth  daily for 3 days, then take 3 tablets (30 mg) daily for 3 days, then take 2 tablets (20 mg) daily for 2 days, then resume 1 tablet (10 mg) daily 20 tablet 0   No current facility-administered medications on file prior to visit.        ROS:  All others reviewed and negative.  Objective        PE:  BP 120/66 (BP Location: Right Arm, Patient Position: Sitting, Cuff Size: Normal)   Pulse 67   Temp 99.5 F (37.5 C) (Oral)  Ht 5\' 1"  (1.549 m)   Wt 191 lb (86.6 kg)   SpO2 99%   BMI 36.09 kg/m                 Constitutional: Pt appears in NAD               HENT: Head: NCAT.                Right Ear: External ear normal.                 Left Ear: External ear normal.                Eyes: . Pupils are equal, round, and reactive to light. Conjunctivae and EOM are normal               Nose: without d/c or deformity               Neck: Neck supple. Gross normal ROM               Cardiovascular: Normal rate and regular rhythm.                 Pulmonary/Chest: Effort normal and breath sounds without rales or wheezing.                Abd:  Soft, NT, ND, + BS, no organomegaly               Neurological: Pt is alert. At baseline orientation, motor grossly intact               Skin: Skin is warm. No rashes, no other new lesions, LE edema - none               Psychiatric: Pt behavior is normal without agitation   Micro: none  Cardiac tracings I have personally interpreted today:  none  Pertinent Radiological findings (summarize): none   Lab Results  Component Value Date   WBC 13.1 (H) 12/09/2023   HGB 12.9 12/09/2023   HCT 41.9 12/09/2023   PLT 212 12/09/2023   GLUCOSE 266 (H) 12/09/2023   CHOL 160 08/29/2023   TRIG 218.0 (H) 08/29/2023   HDL 43.30 08/29/2023   LDLDIRECT 82.0 04/17/2023   LDLCALC 73 08/29/2023   ALT 20 08/29/2023   AST 15 08/29/2023   NA 140 12/09/2023   K 4.9 12/09/2023   CL 102 12/09/2023   CREATININE 0.64 12/09/2023   BUN 10 12/09/2023   CO2 31 12/09/2023    TSH 0.60 02/17/2023   INR 1.0 12/07/2023   HGBA1C 7.7 (H) 08/29/2023   MICROALBUR 6.0 (H) 02/17/2023   Assessment/Plan:  [name] is a 68 y.o. Other or two or more races [6] female with  has a past medical history of Bronchitis, CAD (coronary artery disease), CHF (congestive heart failure) (HCC), COPD (chronic obstructive pulmonary disease) (HCC), Diabetes (HCC), Headache, Heart disease, Hyperlipidemia, Hypertension, Hypertensive emergency (07/01/2013), Impaired glucose tolerance (08/20/2014), and Sinusitis.  Acute on chronic respiratory failure with hypoxia and hypercapnia (HCC) Resolved to baseline, for contd home o2 3L  COPD (chronic obstructive pulmonary disease) with chronic bronchitis (HCC) Improved to baseline, to finish prednisone taper  Non-insulin dependent type 2 diabetes mellitus (HCC) Mild uncontrolled on prednisone taper - pt to be instructed to use lantus 15 units for next 6 days, then continue chck cbgs', consider restart lantus 10 u after that for any persistent elevated sugars > 140  Followup: Return if  symptoms worsen or fail to improve.  Oliver Barre, MD 12/20/2023 8:13 PM [facility] Medicine

## 2023-12-17 NOTE — Progress Notes (Signed)
 Specialty Pharmacy Refill Coordination Note  [name] is a 68 y.o. female contacted today regarding refills of specialty medication(s) Dupilumab (Dupixent)   Patient requested Delivery   Delivery date: 12/24/23   Verified address: [address]   [place] [address]   Medication will be filled on 12/23/23.

## 2023-12-17 NOTE — Patient Instructions (Signed)
 Ok to take the [facility] 15 units once per day for 6 days starting today  Please continue to monitor your sugars after the prednisone as you do, and you could restart this if you like at 10 units once daily if your sugar are still more then 130  Please continue all other medications as before, including finishing the prednisone  Please have the pharmacy call with any other refills you may need.  Please keep your appointments with your specialists as you may have planned  Please make an Appointment to return in may 2025 as you mentioned, or sooner if needed

## 2023-12-18 ENCOUNTER — Telehealth: Payer: Self-pay | Admitting: Internal Medicine

## 2023-12-18 NOTE — Telephone Encounter (Signed)
 Copied from CRM [id number]. Topic: Clinical - Medication Question >> Dec 18, 2023  4:23 PM Almira Coaster wrote: Reason for CRM: Patient is calling to follow up on her request to speak with Dr.John's nurse regarding how to inject her insulin. Patient is concerned that missing a dose may affect her in a negative way. Best call back number is [phone].

## 2023-12-18 NOTE — Telephone Encounter (Signed)
 Copied from CRM [id number]. Topic: Clinical - Medical Advice >> Dec 18, 2023  3:06 PM Sim Boast F wrote: Reason for CRM: Patient needs help on how to inject herself with her insulin, said she received instructions yesterday but needs to know again

## 2023-12-19 DIAGNOSIS — J449 Chronic obstructive pulmonary disease, unspecified: Secondary | ICD-10-CM | POA: Diagnosis not present

## 2023-12-19 DIAGNOSIS — J9610 Chronic respiratory failure, unspecified whether with hypoxia or hypercapnia: Secondary | ICD-10-CM | POA: Diagnosis not present

## 2023-12-20 ENCOUNTER — Encounter: Payer: Self-pay | Admitting: Internal Medicine

## 2023-12-20 NOTE — Assessment & Plan Note (Addendum)
 Resolved to baseline, for contd home o2 3L

## 2023-12-20 NOTE — Assessment & Plan Note (Signed)
 Improved to baseline, to finish prednisone taper

## 2023-12-20 NOTE — Assessment & Plan Note (Signed)
 Mild uncontrolled on prednisone taper - pt to be instructed to use lantus 15 units for next 6 days, then continue chck cbgs', consider restart lantus 10 u after that for any persistent elevated sugars > 140

## 2023-12-22 ENCOUNTER — Telehealth: Payer: Self-pay | Admitting: *Deleted

## 2023-12-22 NOTE — Patient Outreach (Signed)
 Care Coordination   Follow Up Visit Note- post- TOC call on 12/15/23   12/22/2023 Name: [name] MRN: [id number] DOB: [birth year]  [name] is a 68 y.o. year old female who sees Corwin Levins, MD for primary care. I spoke with  [name] by phone today.  Received off-hours voice message from patient, requesting call back; in her voice message, she reported having ongoing questions about the short-term insulin she was prescribed post- recent hospital discharge  What matters to the patients health and wellness today?  "Thanks for calling me back; I don't have any questions now, like I did when I called you this weekend- everything worked out, I know how to give the insulin- but I am not having to use the insulin because my blood sugars are fine, they are doing great.  They only prescribed the insulin for me for short term, 6 days, only while I am taking prednisone"   SDOH assessments and interventions completed:  No Previously completed on 12/15/23  Interventions Today    Flowsheet Row Most Recent Value  Chronic Disease   Chronic disease during today's visit Chronic Obstructive Pulmonary Disease (COPD), Diabetes  General Interventions   General Interventions Discussed/Reviewed General Interventions Discussed, General Interventions Reviewed, Doctor Visits  Doctor Visits Discussed/Reviewed Doctor Visits Discussed, PCP  [Reviewed with patient hospital office visit on 12/17/23: she verbalizes good understanding of post- hospital discharge instructions and today, denies ongoing questions post- PCP office visit on 12/17/23]  Education Interventions   Education Provided Provided Education  Provided Verbal Education On Blood Sugar Monitoring, Other  [reinterated post- hospital follow up office visit with PCP instructions- she verbalizes good understanding of same and is able to accurately verbalize without prompting]  Pharmacy Interventions   Pharmacy Dicussed/Reviewed Pharmacy Topics  Discussed       Care Coordination Interventions:  Yes, provided   Follow up plan: No further intervention required.  Patient reports that since her call to me over the weekend, she no longer has questions/ concerns; confirmed she has my direct call-back number should additional questions/ concerns arise post- care coordination outreach today  Encounter Outcome:  Patient Visit Completed   Total time spent from review to signing of note/ including any care coordination interventions: 22 minutes  Pls call/ message for questions,  Rhonda Pina, RN, BSN, CCRN Alumnus RN Care Manager  Transitions of Care  [facility] - [facility] [phone]: direct office

## 2023-12-23 ENCOUNTER — Other Ambulatory Visit: Payer: Self-pay

## 2023-12-23 ENCOUNTER — Encounter: Payer: Self-pay | Admitting: Internal Medicine

## 2023-12-23 MED ORDER — DOXYCYCLINE HYCLATE 100 MG PO TABS: 100.0000 mg | ORAL_TABLET | Freq: Two times a day (BID) | ORAL | 0 refills | Status: DC

## 2023-12-30 ENCOUNTER — Ambulatory Visit (INDEPENDENT_AMBULATORY_CARE_PROVIDER_SITE_OTHER): Payer: Medicare HMO | Admitting: Pulmonary Disease

## 2023-12-30 ENCOUNTER — Telehealth (HOSPITAL_BASED_OUTPATIENT_CLINIC_OR_DEPARTMENT_OTHER): Payer: Self-pay

## 2023-12-30 ENCOUNTER — Encounter (HOSPITAL_BASED_OUTPATIENT_CLINIC_OR_DEPARTMENT_OTHER): Payer: Self-pay | Admitting: Pulmonary Disease

## 2023-12-30 VITALS — BP 122/80 | HR 77 | Ht 61.0 in | Wt 193.7 lb

## 2023-12-30 DIAGNOSIS — J9611 Chronic respiratory failure with hypoxia: Secondary | ICD-10-CM | POA: Diagnosis not present

## 2023-12-30 DIAGNOSIS — E119 Type 2 diabetes mellitus without complications: Secondary | ICD-10-CM | POA: Diagnosis not present

## 2023-12-30 DIAGNOSIS — Z7985 Long-term (current) use of injectable non-insulin antidiabetic drugs: Secondary | ICD-10-CM

## 2023-12-30 DIAGNOSIS — J441 Chronic obstructive pulmonary disease with (acute) exacerbation: Secondary | ICD-10-CM | POA: Diagnosis not present

## 2023-12-30 DIAGNOSIS — Z87891 Personal history of nicotine dependence: Secondary | ICD-10-CM

## 2023-12-30 DIAGNOSIS — J9612 Chronic respiratory failure with hypercapnia: Secondary | ICD-10-CM

## 2023-12-30 MED ORDER — PREDNISONE 10 MG PO TABS
10.0000 mg | ORAL_TABLET | Freq: Two times a day (BID) | ORAL | 1 refills | Status: DC | PRN
Start: 2023-12-30 — End: 2024-03-12

## 2023-12-30 NOTE — Progress Notes (Signed)
 Subjective:    Patient ID: [name], female    DOB: [birth year], 68 y.o.   MRN: [id number]  HPI  68 yo smoker for FU of COPD, quit 11/2017 and mild tracheomalacia with chronic hypoxic & hypercarbic resp failure on o2 since 2018 -NIV since 2024   Recurrent exacerbations in 2022, requiring maintenance prednisone starting 06/2021 -hospitalized 10/2022  for 2 days for COPD exacerbation treated with BiPAP, steroids and bronchodilators    PMH - CABG HFpEF  11/19/23 started dupixent 300 mg SQ q 2 weeks Hosp 11/2023 acute on chronic hypoxic/hypercapnic respiratory failure due to COPD exacerbation due to coronavirus infection.    The patient, with a history of severe COPD and chronic hypoxic and hypercapnic respiratory failure, was recently hospitalized due to a viral infection. The patient reports feeling about 75-80% recovered from the illness, with residual phlegm production. The patient also reports sleepiness, which has become a new normal for her. The patient uses a BiPAP machine at home regularly and even during daytime naps. The patient is also diabetic and is on a regimen of prednisone, metformin, and Trulicity. The patient's blood sugar levels had spiked while in the hospital due to increased prednisone dosage but have since leveled off. The patient started Dupixent about two months ago, but it is too early to assess its effectiveness. The patient also uses a portable oxygen concentrator (POC) and is seeking to get one through insurance.  Significant tests/ events reviewed   CT angiogram chest 01/2020-emphysema, mild tracheomalacia   04/2019-pulmonary function test- FVC 1.05 (44% predicted), postbronchodilator ratio 47, postbronchodilator FEV1 0.47 (25% predicted), DLCO 54   Spirometry 02/2017 >> ratio of 59, FEV1 of 42% FVC of 56% ABG 01/2014 was 7.29/66/75/92%   08/2014 >> PSG neg for OSA , Desaturation as low as 85%   05/2023 AEC 319  Review of Systems neg for any significant  sore throat, dysphagia, itching, sneezing, nasal congestion or excess/ purulent secretions, fever, chills, sweats, unintended wt loss, pleuritic or exertional cp, hempoptysis, orthopnea pnd or change in chronic leg swelling. Also denies presyncope, palpitations, heartburn, abdominal pain, nausea, vomiting, diarrhea or change in bowel or urinary habits, dysuria,hematuria, rash, arthralgias, visual complaints, headache, numbness weakness or ataxia.     Objective:   Physical Exam  Gen. Pleasant, obese, in no distress ENT - no lesions, no post nasal drip, streoid facies + Neck: No JVD, no thyromegaly, no carotid bruits Lungs: no use of accessory muscles, no dullness to percussion, decreased without rales or rhonchi  Cardiovascular: Rhythm regular, heart sounds  normal, no murmurs or gallops, no peripheral edema Musculoskeletal: No deformities, no cyanosis or clubbing , no tremors       Assessment & Plan:    Chronic Obstructive Pulmonary Disease (COPD) with recent exacerbation Severe COPD exacerbation likely due to a viral infection, possibly coronavirus, resulting in hospitalization. Currently on stable prednisone 10 mg daily, with plans to taper if condition improves. Dupixent initiated in January to manage COPD; effectiveness yet to be determined. Goal is to reduce flare-ups and potentially discontinue prednisone. Dupixent may not show immediate effects but aims to reduce prednisone dependency and flare-up frequency over six months. - Continue Dupixent every two weeks - Complete current course of doxycycline - Continue prednisone 10 mg daily for March - Plan to taper prednisone to 5 mg in April if condition improves - Use Mucinex daily for phlegm management  Chronic hypoxic and hypercarbic respiratory failure Chronic hypoxic and hypercarbic respiratory failure managed with BiPAP  at home, used regularly including during naps. Evaluating for portable oxygen concentrator (POC) eligibility,  requiring demonstration of low oxygen levels off current oxygen therapy. - Continue using BiPAP during sleep and naps - Evaluate for portable oxygen concentrator (POC) eligibility - Demonstrate low oxygen levels off current oxygen therapy for POC qualification  Diabetes Mellitus Diabetes mellitus exacerbated by high doses of prednisone during hospitalization, leading to elevated blood glucose levels. Blood glucose stabilized with metformin and Trulicity, without need for insulin. - Continue metformin and Trulicity as prescribed - Monitor blood glucose levels, especially with changes in prednisone dosage

## 2023-12-30 NOTE — Patient Instructions (Addendum)
 X Requalify for POC  Order to [facility]  X Refill on pred Decrease prednisone as follows March - 10 mg daily April - alternate 10/5 mg on M/w/F May - 5 mg daily  VISIT SUMMARY:  During your visit, we discussed your recent hospitalization due to a viral infection and your ongoing recovery. You reported feeling about 75-80% better but still experiencing some phlegm production and sleepiness. We reviewed your current medications and management plans for COPD, chronic respiratory failure, and diabetes. We also discussed the potential benefits of Dupixent and the use of a portable oxygen concentrator.  YOUR PLAN:  -CHRONIC OBSTRUCTIVE PULMONARY DISEASE (COPD) WITH RECENT EXACERBATION: COPD is a chronic lung condition that makes it hard to breathe. Your recent exacerbation was likely due to a viral infection. We will continue your current medications, including Dupixent every two weeks and prednisone 10 mg daily for March, with a plan to taper to 5 mg in April if your condition improves. You should also complete your current course of doxycycline and use Mucinex daily to help manage phlegm.  -CHRONIC HYPOXIC AND HYPERCARBIC RESPIRATORY FAILURE: This condition means your lungs are not getting enough oxygen and are retaining too much carbon dioxide. You should continue using your BiPAP machine during sleep and naps. We are evaluating your eligibility for a portable oxygen concentrator, which requires demonstrating low oxygen levels off your current oxygen therapy.  -DIABETES MELLITUS: Diabetes is a condition where your blood sugar levels are too high. Your blood sugar levels spiked during your hospitalization due to increased prednisone but have since stabilized. Continue taking metformin and Trulicity as prescribed and monitor your blood glucose levels, especially with any changes in your prednisone dosage.

## 2023-12-31 ENCOUNTER — Telehealth: Payer: Self-pay

## 2023-12-31 DIAGNOSIS — H04123 Dry eye syndrome of bilateral lacrimal glands: Secondary | ICD-10-CM | POA: Diagnosis not present

## 2023-12-31 DIAGNOSIS — H40013 Open angle with borderline findings, low risk, bilateral: Secondary | ICD-10-CM | POA: Diagnosis not present

## 2023-12-31 DIAGNOSIS — H35373 Puckering of macula, bilateral: Secondary | ICD-10-CM | POA: Diagnosis not present

## 2023-12-31 DIAGNOSIS — H26492 Other secondary cataract, left eye: Secondary | ICD-10-CM | POA: Diagnosis not present

## 2023-12-31 LAB — HM DIABETES EYE EXAM

## 2023-12-31 NOTE — Telephone Encounter (Signed)
 Pt received letter stating that "a temporary supply has been provided by insurance, a PA will be required for future refills".  Submitted an URGENT Prior Authorization request to [facility] for DUPIXENT via CoverMyMeds. Will update once we receive a response.  Key: [id number]

## 2023-12-31 NOTE — Telephone Encounter (Signed)
 Received notification from [facility] regarding a prior authorization for DUPIXENT. Authorization has been APPROVED from 12/31/2023 to 12/30/2024. Approval letter sent to scan center.  Authorization # [id number]

## 2023-12-31 NOTE — Telephone Encounter (Signed)
 Noted, will complete PA process in separate encounter.

## 2024-01-01 ENCOUNTER — Encounter: Payer: Self-pay | Admitting: Internal Medicine

## 2024-01-07 ENCOUNTER — Other Ambulatory Visit: Payer: Self-pay | Admitting: Internal Medicine

## 2024-01-13 ENCOUNTER — Other Ambulatory Visit: Payer: Self-pay

## 2024-01-13 ENCOUNTER — Other Ambulatory Visit: Payer: Self-pay | Admitting: Pharmacy Technician

## 2024-01-13 NOTE — Progress Notes (Signed)
 Specialty Pharmacy Refill Coordination Note  [name] is a 68 y.o. female contacted today regarding refills of specialty medication(s) Dupilumab (Dupixent)   Patient requested (Patient-Rptd) Delivery   Delivery date: (Patient-Rptd) 01/19/24 01/20/2024   Verified address: (Patient-Rptd) [address] [place], [place] [address]   Medication will be filled on 01/19/2024 .

## 2024-01-15 ENCOUNTER — Other Ambulatory Visit: Payer: Self-pay | Admitting: Internal Medicine

## 2024-01-15 DIAGNOSIS — Z1231 Encounter for screening mammogram for malignant neoplasm of breast: Secondary | ICD-10-CM

## 2024-01-17 DIAGNOSIS — J449 Chronic obstructive pulmonary disease, unspecified: Secondary | ICD-10-CM | POA: Diagnosis not present

## 2024-01-17 DIAGNOSIS — J9610 Chronic respiratory failure, unspecified whether with hypoxia or hypercapnia: Secondary | ICD-10-CM | POA: Diagnosis not present

## 2024-01-27 ENCOUNTER — Ambulatory Visit: Admitting: Sports Medicine

## 2024-02-04 ENCOUNTER — Other Ambulatory Visit: Payer: Self-pay | Admitting: Internal Medicine

## 2024-02-04 ENCOUNTER — Other Ambulatory Visit: Payer: Self-pay

## 2024-02-04 ENCOUNTER — Encounter (HOSPITAL_BASED_OUTPATIENT_CLINIC_OR_DEPARTMENT_OTHER): Payer: Self-pay | Admitting: Pulmonary Disease

## 2024-02-04 ENCOUNTER — Ambulatory Visit: Payer: Self-pay

## 2024-02-04 NOTE — Telephone Encounter (Signed)
 This is fine - she should retime subsequent Dupixent doses to be q 14 days. Minimal concern for side effects considering she received a "double dose" for her first dose in office  Called patient and discussed above. She verbalized understanding and nothing further is needed  Geraldene Kleine, PharmD, MPH, BCPS, CPP Clinical Pharmacist (Rheumatology and Pulmonology)

## 2024-02-04 NOTE — Telephone Encounter (Signed)
 Copied from CRM [id number]. Topic: Clinical - Red Word Triage >> Feb 04, 2024 11:07 AM Isabell A wrote: [facility] that prompted transfer to Nurse Triage: Patient states she took her Dupixent a week earlier by accident - she's not sure what symptoms may be, so far she is not feeling any different.   Patient reports that she took her Dupixent today, and was not supposed to take it until next week. She denies any current symptoms but wanted to know if she should be concerned. Patient would like to hear back from the office as soon as possible to advise her on this. Patient also advised she could contact her pharmacist who should be able to advise her as well. Patient understood and is agreeable with this plan. Patient instructed to call back for new or worsening symptoms. Patient verbalized understanding and agreement with this plan.    Reason for Disposition  [1] DOUBLE DOSE (an extra dose or lesser amount) of prescription drug AND [2] NO symptoms  (Exception: A double dose of antibiotics.)  Answer Assessment - Initial Assessment Questions 1. NAME of MEDICINE: "What medicine(s) are you calling about?"     Dupixent  2. QUESTION: "What is your question?" (e.g., double dose of medicine, side effect)     Took a week early  3. PRESCRIBER: "Who prescribed the medicine?" Reason: if prescribed by specialist, call should be referred to that group.     Dr. Alva  4. SYMPTOMS: "Do you have any symptoms?" If Yes, ask: "What symptoms are you having?"  "How bad are the symptoms (e.g., mild, moderate, severe)     No  Protocols used: Medication Question Call-A-AH

## 2024-02-04 NOTE — Telephone Encounter (Signed)
 Please relay message form Devki to patient Next dose will be in 2 weeks Minimal concern for side effects as her first dose in office was a double dose

## 2024-02-05 ENCOUNTER — Ambulatory Visit: Admitting: Internal Medicine

## 2024-02-16 ENCOUNTER — Other Ambulatory Visit: Payer: Self-pay | Admitting: Pharmacy Technician

## 2024-02-16 ENCOUNTER — Other Ambulatory Visit: Payer: Self-pay

## 2024-02-16 NOTE — Progress Notes (Signed)
 Specialty Pharmacy Refill Coordination Note  ANNICA SCHWISOW is a 68 y.o. female contacted today regarding refills of specialty medication(s) Dupilumab  (Dupixent )   Patient requested (Patient-Rptd) Delivery   Delivery date: (Patient-Rptd) 02/18/24   Verified address: (Patient-Rptd) [address] [place], [place] [address]   Medication will be filled on 02/17/24.

## 2024-02-17 ENCOUNTER — Other Ambulatory Visit: Payer: Self-pay

## 2024-02-17 DIAGNOSIS — J449 Chronic obstructive pulmonary disease, unspecified: Secondary | ICD-10-CM | POA: Diagnosis not present

## 2024-02-17 DIAGNOSIS — J9610 Chronic respiratory failure, unspecified whether with hypoxia or hypercapnia: Secondary | ICD-10-CM | POA: Diagnosis not present

## 2024-02-21 DIAGNOSIS — J449 Chronic obstructive pulmonary disease, unspecified: Secondary | ICD-10-CM | POA: Diagnosis not present

## 2024-02-24 ENCOUNTER — Other Ambulatory Visit: Payer: Self-pay | Admitting: Pulmonary Disease

## 2024-02-24 ENCOUNTER — Other Ambulatory Visit: Payer: Self-pay | Admitting: Sports Medicine

## 2024-02-26 ENCOUNTER — Ambulatory Visit: Payer: Medicare HMO | Admitting: Internal Medicine

## 2024-03-01 ENCOUNTER — Ambulatory Visit: Admitting: Emergency Medicine

## 2024-03-11 ENCOUNTER — Ambulatory Visit (INDEPENDENT_AMBULATORY_CARE_PROVIDER_SITE_OTHER): Admitting: Internal Medicine

## 2024-03-11 ENCOUNTER — Encounter: Payer: Self-pay | Admitting: Internal Medicine

## 2024-03-11 ENCOUNTER — Ambulatory Visit: Payer: Self-pay | Admitting: Internal Medicine

## 2024-03-11 VITALS — BP 118/68 | HR 70 | Temp 99.6°F | Ht 61.0 in | Wt 192.0 lb

## 2024-03-11 DIAGNOSIS — I10 Essential (primary) hypertension: Secondary | ICD-10-CM | POA: Diagnosis not present

## 2024-03-11 DIAGNOSIS — E119 Type 2 diabetes mellitus without complications: Secondary | ICD-10-CM | POA: Diagnosis not present

## 2024-03-11 DIAGNOSIS — E538 Deficiency of other specified B group vitamins: Secondary | ICD-10-CM

## 2024-03-11 DIAGNOSIS — Z0001 Encounter for general adult medical examination with abnormal findings: Secondary | ICD-10-CM

## 2024-03-11 DIAGNOSIS — Z7984 Long term (current) use of oral hypoglycemic drugs: Secondary | ICD-10-CM

## 2024-03-11 DIAGNOSIS — E785 Hyperlipidemia, unspecified: Secondary | ICD-10-CM | POA: Diagnosis not present

## 2024-03-11 DIAGNOSIS — Z0000 Encounter for general adult medical examination without abnormal findings: Secondary | ICD-10-CM | POA: Diagnosis not present

## 2024-03-11 DIAGNOSIS — E559 Vitamin D deficiency, unspecified: Secondary | ICD-10-CM | POA: Diagnosis not present

## 2024-03-11 DIAGNOSIS — Z7985 Long-term (current) use of injectable non-insulin antidiabetic drugs: Secondary | ICD-10-CM

## 2024-03-11 LAB — CBC WITH DIFFERENTIAL/PLATELET
Basophils Absolute: 0 10*3/uL (ref 0.0–0.1)
Basophils Relative: 0.4 % (ref 0.0–3.0)
Eosinophils Absolute: 0.1 10*3/uL (ref 0.0–0.7)
Eosinophils Relative: 1 % (ref 0.0–5.0)
HCT: 41.5 % (ref 36.0–46.0)
Hemoglobin: 13.3 g/dL (ref 12.0–15.0)
Lymphocytes Relative: 12.5 % (ref 12.0–46.0)
Lymphs Abs: 1.2 10*3/uL (ref 0.7–4.0)
MCHC: 32.1 g/dL (ref 30.0–36.0)
MCV: 86 fl (ref 78.0–100.0)
Monocytes Absolute: 0.4 10*3/uL (ref 0.1–1.0)
Monocytes Relative: 4.5 % (ref 3.0–12.0)
Neutro Abs: 8 10*3/uL — ABNORMAL HIGH (ref 1.4–7.7)
Neutrophils Relative %: 81.6 % — ABNORMAL HIGH (ref 43.0–77.0)
Platelets: 207 10*3/uL (ref 150.0–400.0)
RBC: 4.83 Mil/uL (ref 3.87–5.11)
RDW: 14.4 % (ref 11.5–15.5)
WBC: 9.8 10*3/uL (ref 4.0–10.5)

## 2024-03-11 LAB — URINALYSIS, ROUTINE W REFLEX MICROSCOPIC
Bilirubin Urine: NEGATIVE
Hgb urine dipstick: NEGATIVE
Ketones, ur: NEGATIVE
Leukocytes,Ua: NEGATIVE
Nitrite: NEGATIVE
RBC / HPF: NONE SEEN (ref 0–?)
Specific Gravity, Urine: 1.015 (ref 1.000–1.030)
Total Protein, Urine: NEGATIVE
Urine Glucose: NEGATIVE
Urobilinogen, UA: 0.2 (ref 0.0–1.0)
pH: 6 (ref 5.0–8.0)

## 2024-03-11 LAB — BASIC METABOLIC PANEL WITH GFR
BUN: 10 mg/dL (ref 6–23)
CO2: 39 meq/L — ABNORMAL HIGH (ref 19–32)
Calcium: 9.5 mg/dL (ref 8.4–10.5)
Chloride: 99 meq/L (ref 96–112)
Creatinine, Ser: 0.58 mg/dL (ref 0.40–1.20)
GFR: 93.07 mL/min (ref >60.00)
Glucose, Bld: 194 mg/dL — ABNORMAL HIGH (ref 70–99)
Potassium: 4.3 meq/L (ref 3.5–5.1)
Sodium: 142 meq/L (ref 135–145)

## 2024-03-11 LAB — MICROALBUMIN / CREATININE URINE RATIO
Creatinine,U: 81.9 mg/dL
Microalb Creat Ratio: 75.4 mg/g — ABNORMAL HIGH (ref 0.0–30.0)
Microalb, Ur: 6.2 mg/dL — ABNORMAL HIGH (ref 0.0–1.9)

## 2024-03-11 LAB — TSH: TSH: 2.34 u[IU]/mL (ref 0.35–5.50)

## 2024-03-11 LAB — LIPID PANEL
Cholesterol: 158 mg/dL (ref 0–200)
HDL: 47.7 mg/dL (ref >39.00)
LDL Cholesterol: 66 mg/dL (ref 0–99)
NonHDL: 110.5
Total CHOL/HDL Ratio: 3
Triglycerides: 224 mg/dL — ABNORMAL HIGH (ref 0.0–149.0)
VLDL: 44.8 mg/dL — ABNORMAL HIGH (ref 0.0–40.0)

## 2024-03-11 LAB — HEMOGLOBIN A1C: Hgb A1c MFr Bld: 7.4 % — ABNORMAL HIGH (ref 4.6–6.5)

## 2024-03-11 LAB — VITAMIN B12: Vitamin B-12: 479 pg/mL (ref 211–911)

## 2024-03-11 LAB — HEPATIC FUNCTION PANEL
ALT: 25 U/L (ref 0–35)
AST: 16 U/L (ref 0–37)
Albumin: 4.4 g/dL (ref 3.5–5.2)
Alkaline Phosphatase: 89 U/L (ref 39–117)
Bilirubin, Direct: 0.1 mg/dL (ref 0.0–0.3)
Total Bilirubin: 0.3 mg/dL (ref 0.2–1.2)
Total Protein: 6.8 g/dL (ref 6.0–8.3)

## 2024-03-11 LAB — VITAMIN D 25 HYDROXY (VIT D DEFICIENCY, FRACTURES): VITD: 50.52 ng/mL (ref 30.00–100.00)

## 2024-03-11 MED ORDER — TRULICITY 3 MG/0.5ML ~~LOC~~ SOAJ: 3.0000 mg | SUBCUTANEOUS | 3 refills | Status: DC

## 2024-03-11 NOTE — Progress Notes (Signed)
 Patient ID: [name], female   DOB: 03-13-1956, 68 y.o.   MRN: [id number]         Chief Complaint:: wellness exam and bilateral thumb arthritis, osteopenia, chronic hypoxic resp failure, dm       HPI:  [name] is a 68 y.o. female here for wellness exam; for tdap at the pharmacy, o/w up to date                        Also has persistent > 1 mo bilateral thumb cmc arthritis pain with use, better to rest and voltaren gel prn.  Is on chronic prednisone  per pulm, and recent start fosamax  first dose yesterday for osteopenia, tolerating well.  Pt denies chest pain, increased sob or doe, wheezing, orthopnea, PND, increased LE swelling, palpitations, dizziness or syncope. Remains on home o2 3L per [place] continous.   Pt denies polydipsia, polyuria, or new focal neuro s/s.   Has difficulty losing wt, sugars have been mild elevated with prednisone .   Pt denies polydipsia, polyuria, or new focal neuro s/s.    Pt denies fever, wt loss, night sweats, loss of appetite, or other constitutional symptoms     Wt Readings from Last 3 Encounters:  03/11/24 192 lb (87.1 kg)  12/30/23 193 lb 11.2 oz (87.9 kg)  12/17/23 191 lb (86.6 kg)   BP Readings from Last 3 Encounters:  03/11/24 118/68  12/30/23 122/80  12/17/23 120/66   Immunization History  Administered Date(s) Administered   Fluad Quad(high Dose 65+) 07/10/2022   Fluad Trivalent(High Dose 65+) 08/29/2023   Influenza Split 07/08/2013   Influenza,inj,Quad PF,6+ Mos 07/31/2015, 09/03/2017, 10/04/2021   PFIZER Comirnaty(Gray Top)Covid-19 Tri-Sucrose Vaccine 08/31/2020   PFIZER(Purple Top)SARS-COV-2 Vaccination 01/01/2020, 01/25/2020   PNEUMOCOCCAL CONJUGATE-20 11/14/2022   Pneumococcal Polysaccharide-23 02/02/2014, 05/03/2019   Zoster Recombinant(Shingrix) 01/21/2024   Health Maintenance Due  Topic Date Due   DTaP/Tdap/Td (1 - Tdap) Never done      Past Medical History:  Diagnosis Date   Bronchitis    CAD (coronary artery disease)     a. 02/2002: CABG x3 with LIMA to LAD, RIMA to RCA, and SVG to OM   CHF (congestive heart failure) (HCC)    COPD (chronic obstructive pulmonary disease) (HCC)    Diabetes (HCC)    Headache    Heart disease    Hyperlipidemia    Hypertension    Hypertensive emergency 07/01/2013   Impaired glucose tolerance 08/20/2014   Sinusitis    Past Surgical History:  Procedure Laterality Date   CORONARY ARTERY BYPASS GRAFT  2003   Triple bypass   ESOPHAGOGASTRODUODENOSCOPY Left 06/13/2014   Procedure: ESOPHAGOGASTRODUODENOSCOPY (EGD);  Surgeon: Tami Falcon, MD;  Location: [facility] ENDOSCOPY;  Service: Endoscopy;  Laterality: Left;   RIGID ESOPHAGOSCOPY N/A 06/12/2014   Procedure: RIGID ESOPHAGOSCOPY WITH FOREIGN BODY REMOVAL;  Surgeon: Lawence Press, MD;  Location: [facility] OR;  Service: ENT;  Laterality: N/A;    reports that she quit smoking about 4 years ago. Her smoking use included cigarettes. She started smoking about 52 years ago. She has a 23.7 pack-year smoking history. She has never used smokeless tobacco. She reports current alcohol use of about 5.0 - 6.0 standard drinks of alcohol per week. She reports that she does not currently use drugs after having used the following drugs: Marijuana. family history includes Brain cancer in her mother; Cancer in her maternal grandmother and another family member; Heart disease in her mother; Lung  cancer in her maternal aunt and paternal uncle; Stroke in an other family member; Suicidality in her brother. Allergies  Allergen Reactions   Chantix  [Varenicline ] Nausea And Vomiting   Augmentin  [Amoxicillin -Pot Clavulanate] Nausea And Vomiting   Current Outpatient Medications on File Prior to Visit  Medication Sig Dispense Refill   acetaminophen  (TYLENOL ) 500 MG tablet Take 1,000 mg by mouth 2 (two) times daily as needed for headache, fever, moderate pain or mild pain.     albuterol  (PROVENTIL ) (2.5 MG/3ML) 0.083% nebulizer solution Take 3 mLs (2.5 mg total) by nebulization  every 6 (six) hours as needed for wheezing or shortness of breath. 75 mL 12   albuterol  (VENTOLIN  HFA) 108 (90 Base) MCG/ACT inhaler INHALE 1-2 PUFFS BY MOUTH EVERY 6 HOURS AS NEEDED FOR WHEEZE OR SHORTNESS OF BREATH 18 each 5   alendronate  (FOSAMAX ) 70 MG tablet Take 70 mg by mouth once a week.     amLODipine  (NORVASC ) 5 MG tablet TAKE 1 TABLET (5 MG TOTAL) BY MOUTH DAILY. 90 tablet 3   atorvastatin  (LIPITOR) 40 MG tablet Take 1 tablet (40 mg total) by mouth daily. 90 tablet 3   bisoprolol  (ZEBETA ) 5 MG tablet TAKE 1 TABLET (5 MG TOTAL) BY MOUTH DAILY. 90 tablet 2   Cholecalciferol (VITAMIN D -3 PO) Take 1 capsule by mouth in the morning.     Cyanocobalamin  (VITAMIN B-12 PO) Take 1 tablet by mouth in the morning.     Dupilumab  (DUPIXENT ) 300 MG/2ML SOAJ Inject 300 mg into the skin every 14 (fourteen) days. 12 mL 1   fluticasone  (FLONASE ) 50 MCG/ACT nasal spray Place 1 spray into both nostrils daily. 15.8 mL 0   Fluticasone -Umeclidin-Vilant (TRELEGY ELLIPTA ) 200-62.5-25 MCG/ACT AEPB INHALE 1 PUFF BY MOUTH EVERY DAY 60 each 11   furosemide  (LASIX ) 20 MG tablet Take 20 mg by mouth 3 (three) times a week.     ipratropium-albuterol  (DUONEB) 0.5-2.5 (3) MG/3ML SOLN INHALE 3 ML BY NEBULIZER EVERY 6 HOURS AS NEEDED 360 mL 11   isosorbide  mononitrate (IMDUR ) 60 MG 24 hr tablet TAKE 1.5 TABLETS (90 MG TOTAL) BY MOUTH DAILY. 135 tablet 3   KLOR-CON  M10 10 MEQ tablet TAKE 1 TABLET BY MOUTH EVERY DAY 90 tablet 1   MAGNESIUM  OXIDE PO Take 1 tablet by mouth at bedtime.     metFORMIN  (GLUCOPHAGE -XR) 500 MG 24 hr tablet TAKE 2 TABLET BY MOUTH EVERY DAY WITH BREAKFAST 180 tablet 3   Multiple Vitamins-Minerals (MULTIVITAMIN WOMEN 50+) TABS Take 1 tablet by mouth in the morning.     No current facility-administered medications on file prior to visit.        ROS:  All others reviewed and negative.  Objective        PE:  BP 118/68 (BP Location: Right Arm, Patient Position: Sitting, Cuff Size: Normal)   Pulse 70    Temp 99.6 F (37.6 C) (Oral)   Ht 5\' 1"  (1.549 m)   Wt 192 lb (87.1 kg)   SpO2 99%   BMI 36.28 kg/m                 Constitutional: Pt appears in NAD               HENT: Head: NCAT.                Right Ear: External ear normal.                 Left Ear: External ear normal.  Eyes: . Pupils are equal, round, and reactive to light. Conjunctivae and EOM are normal               Nose: without d/c or deformity               Neck: Neck supple. Gross normal ROM               Cardiovascular: Normal rate and regular rhythm.                 Pulmonary/Chest: Effort normal and breath sounds without rales or wheezing.                Abd:  Soft, NT, ND, + BS, no organomegaly               Neurological: Pt is alert. At baseline orientation, motor grossly intact               Skin: Skin is warm. No rashes, no other new lesions, LE edema - none               Psychiatric: Pt behavior is normal without agitation   Micro: none  Cardiac tracings I have personally interpreted today:  none  Pertinent Radiological findings (summarize): none   Lab Results  Component Value Date   WBC 9.8 03/11/2024   HGB 13.3 03/11/2024   HCT 41.5 03/11/2024   PLT 207.0 03/11/2024   GLUCOSE 194 (H) 03/11/2024   CHOL 158 03/11/2024   TRIG 224.0 (H) 03/11/2024   HDL 47.70 03/11/2024   LDLDIRECT 82.0 04/17/2023   LDLCALC 66 03/11/2024   ALT 25 03/11/2024   AST 16 03/11/2024   NA 142 03/11/2024   K 4.3 03/11/2024   CL 99 03/11/2024   CREATININE 0.58 03/11/2024   BUN 10 03/11/2024   CO2 39 (H) 03/11/2024   TSH 2.34 03/11/2024   INR 1.0 12/07/2023   HGBA1C 7.4 (H) 03/11/2024   MICROALBUR 6.2 (H) 03/11/2024   Assessment/Plan:  [name] is a 68 y.o. Other or two or more races [6] female with  has a past medical history of Bronchitis, CAD (coronary artery disease), CHF (congestive heart failure) (HCC), COPD (chronic obstructive pulmonary disease) (HCC), Diabetes (HCC), Headache, Heart  disease, Hyperlipidemia, Hypertension, Hypertensive emergency (07/01/2013), Impaired glucose tolerance (08/20/2014), and Sinusitis.  Encounter for well adult exam with abnormal findings Age and sex appropriate education and counseling updated with regular exercise and diet Referrals for preventative services - none needed Immunizations addressed - for tdap at pharmacy Smoking counseling  - none needed Evidence for depression or other mood disorder - none significant Most recent labs reviewed. I have personally reviewed and have noted: 1) the patient's medical and social history 2) The patient's current medications and supplements 3) The patient's height, weight, and BMI have been recorded in the chart   Essential hypertension BP Readings from Last 3 Encounters:  03/11/24 118/68  12/30/23 122/80  12/17/23 120/66   Stable, pt to continue medical treatment norvasc  5 mg qd   Non-insulin  dependent type 2 diabetes mellitus (HCC) Lab Results  Component Value Date   HGBA1C 7.4 (H) 03/11/2024   uncontrolled, pt to continue current medical treatment metformin  ER 500 mg - 2 every day, and increase trulicity3 mg weekly   Vitamin D  deficiency Last vitamin D  Lab Results  Component Value Date   VD25OH 50.52 03/11/2024   Stable, cont oral replacement   Hyperlipidemia Lab Results  Component Value Date  LDLCALC 66 03/11/2024   Stable, pt to continue current statin lipitor 40 mg qd  Followup: Return in about 6 months (around 09/11/2024).  Rosalia Colonel, MD 03/13/2024 9:36 AM [facility] Medicine

## 2024-03-11 NOTE — Patient Instructions (Addendum)
 Ok to increase the trulicity  to the 3 mg dosing at your next refill  Please continue all other medications as before, and refills have been done if requested.  Please have the pharmacy call with any other refills you may need.  Please continue your efforts at being more active, low cholesterol diet, and weight control.  You are otherwise up to date with prevention measures today.  Please keep your appointments with your specialists as you may have planned  Please go to the LAB at the blood drawing area for the tests to be done  You will be contacted by phone if any changes need to be made immediately.  Otherwise, you will receive a letter about your results with an explanation, but please check with MyChart first.  Please make an Appointment to return in 6 months, or sooner if needed  We can plan to repeat the bone density in 1 year

## 2024-03-12 ENCOUNTER — Other Ambulatory Visit (HOSPITAL_COMMUNITY): Payer: Self-pay

## 2024-03-12 ENCOUNTER — Other Ambulatory Visit: Payer: Self-pay

## 2024-03-12 ENCOUNTER — Encounter: Payer: Self-pay | Admitting: Internal Medicine

## 2024-03-12 MED ORDER — PREDNISONE 10 MG PO TABS: ORAL_TABLET | ORAL | 0 refills | Status: DC

## 2024-03-12 MED ORDER — LEVOFLOXACIN 500 MG PO TABS: 500.0000 mg | ORAL_TABLET | Freq: Every day | ORAL | 0 refills | Status: DC

## 2024-03-12 NOTE — Progress Notes (Signed)
 Specialty Pharmacy Refill Coordination Note  [name] is a 68 y.o. female contacted today regarding refills of specialty medication(s) Dupilumab  (Dupixent )   Patient requested Delivery   Delivery date: 03/17/24   Verified address: [address]   [place] [address]   Medication will be filled on 03/16/24.

## 2024-03-13 ENCOUNTER — Encounter: Payer: Self-pay | Admitting: Internal Medicine

## 2024-03-13 NOTE — Assessment & Plan Note (Signed)
 Lab Results  Component Value Date   HGBA1C 7.4 (H) 03/11/2024   uncontrolled, pt to continue current medical treatment metformin  ER 500 mg - 2 every day, and increase trulicity3 mg weekly

## 2024-03-13 NOTE — Assessment & Plan Note (Signed)
 BP Readings from Last 3 Encounters:  03/11/24 118/68  12/30/23 122/80  12/17/23 120/66   Stable, pt to continue medical treatment norvasc  5 mg qd

## 2024-03-13 NOTE — Assessment & Plan Note (Signed)
 Lab Results  Component Value Date   LDLCALC 66 03/11/2024   Stable, pt to continue current statin lipitor 40 mg qd

## 2024-03-13 NOTE — Assessment & Plan Note (Signed)
 Last vitamin D  Lab Results  Component Value Date   VD25OH 50.52 03/11/2024   Stable, cont oral replacement

## 2024-03-13 NOTE — Assessment & Plan Note (Signed)

## 2024-03-15 ENCOUNTER — Other Ambulatory Visit: Payer: Self-pay

## 2024-03-15 ENCOUNTER — Emergency Department (HOSPITAL_COMMUNITY)

## 2024-03-15 ENCOUNTER — Encounter (HOSPITAL_COMMUNITY): Payer: Self-pay

## 2024-03-15 ENCOUNTER — Inpatient Hospital Stay (HOSPITAL_COMMUNITY)
Admission: EM | Admit: 2024-03-15 | Discharge: 2024-03-17 | DRG: 189 | Disposition: A | Discharge status: Home Health | Attending: Student | Admitting: Student

## 2024-03-15 DIAGNOSIS — J9622 Acute and chronic respiratory failure with hypercapnia: Secondary | ICD-10-CM | POA: Diagnosis not present

## 2024-03-15 DIAGNOSIS — E66811 Obesity, class 1: Secondary | ICD-10-CM | POA: Diagnosis present

## 2024-03-15 DIAGNOSIS — Z951 Presence of aortocoronary bypass graft: Secondary | ICD-10-CM | POA: Diagnosis not present

## 2024-03-15 DIAGNOSIS — Z8249 Family history of ischemic heart disease and other diseases of the circulatory system: Secondary | ICD-10-CM | POA: Diagnosis not present

## 2024-03-15 DIAGNOSIS — Z801 Family history of malignant neoplasm of trachea, bronchus and lung: Secondary | ICD-10-CM

## 2024-03-15 DIAGNOSIS — T380X5A Adverse effect of glucocorticoids and synthetic analogues, initial encounter: Secondary | ICD-10-CM | POA: Diagnosis not present

## 2024-03-15 DIAGNOSIS — Z823 Family history of stroke: Secondary | ICD-10-CM

## 2024-03-15 DIAGNOSIS — R000 Tachycardia, unspecified: Secondary | ICD-10-CM | POA: Diagnosis present

## 2024-03-15 DIAGNOSIS — Z7985 Long-term (current) use of injectable non-insulin antidiabetic drugs: Secondary | ICD-10-CM

## 2024-03-15 DIAGNOSIS — I2510 Atherosclerotic heart disease of native coronary artery without angina pectoris: Secondary | ICD-10-CM | POA: Diagnosis not present

## 2024-03-15 DIAGNOSIS — J9621 Acute and chronic respiratory failure with hypoxia: Secondary | ICD-10-CM | POA: Diagnosis not present

## 2024-03-15 DIAGNOSIS — Z1152 Encounter for screening for COVID-19: Secondary | ICD-10-CM | POA: Diagnosis not present

## 2024-03-15 DIAGNOSIS — Z7951 Long term (current) use of inhaled steroids: Secondary | ICD-10-CM | POA: Diagnosis not present

## 2024-03-15 DIAGNOSIS — Z6834 Body mass index (BMI) 34.0-34.9, adult: Secondary | ICD-10-CM | POA: Diagnosis not present

## 2024-03-15 DIAGNOSIS — J441 Chronic obstructive pulmonary disease with (acute) exacerbation: Secondary | ICD-10-CM | POA: Diagnosis not present

## 2024-03-15 DIAGNOSIS — Z79899 Other long term (current) drug therapy: Secondary | ICD-10-CM

## 2024-03-15 DIAGNOSIS — K219 Gastro-esophageal reflux disease without esophagitis: Secondary | ICD-10-CM | POA: Diagnosis not present

## 2024-03-15 DIAGNOSIS — B349 Viral infection, unspecified: Secondary | ICD-10-CM | POA: Diagnosis not present

## 2024-03-15 DIAGNOSIS — F17210 Nicotine dependence, cigarettes, uncomplicated: Secondary | ICD-10-CM | POA: Diagnosis present

## 2024-03-15 DIAGNOSIS — Z716 Tobacco abuse counseling: Secondary | ICD-10-CM

## 2024-03-15 DIAGNOSIS — I5032 Chronic diastolic (congestive) heart failure: Secondary | ICD-10-CM | POA: Diagnosis not present

## 2024-03-15 DIAGNOSIS — Z7983 Long term (current) use of bisphosphonates: Secondary | ICD-10-CM

## 2024-03-15 DIAGNOSIS — Z880 Allergy status to penicillin: Secondary | ICD-10-CM

## 2024-03-15 DIAGNOSIS — E875 Hyperkalemia: Secondary | ICD-10-CM | POA: Diagnosis not present

## 2024-03-15 DIAGNOSIS — E1165 Type 2 diabetes mellitus with hyperglycemia: Secondary | ICD-10-CM | POA: Diagnosis not present

## 2024-03-15 DIAGNOSIS — I110 Hypertensive heart disease with heart failure: Secondary | ICD-10-CM | POA: Diagnosis not present

## 2024-03-15 DIAGNOSIS — Z7984 Long term (current) use of oral hypoglycemic drugs: Secondary | ICD-10-CM

## 2024-03-15 DIAGNOSIS — E785 Hyperlipidemia, unspecified: Secondary | ICD-10-CM | POA: Diagnosis present

## 2024-03-15 DIAGNOSIS — J9611 Chronic respiratory failure with hypoxia: Secondary | ICD-10-CM | POA: Insufficient documentation

## 2024-03-15 DIAGNOSIS — E669 Obesity, unspecified: Secondary | ICD-10-CM | POA: Diagnosis present

## 2024-03-15 DIAGNOSIS — Z888 Allergy status to other drugs, medicaments and biological substances status: Secondary | ICD-10-CM

## 2024-03-15 DIAGNOSIS — R0602 Shortness of breath: Secondary | ICD-10-CM | POA: Diagnosis not present

## 2024-03-15 DIAGNOSIS — I509 Heart failure, unspecified: Secondary | ICD-10-CM

## 2024-03-15 DIAGNOSIS — Z808 Family history of malignant neoplasm of other organs or systems: Secondary | ICD-10-CM

## 2024-03-15 DIAGNOSIS — E872 Acidosis, unspecified: Secondary | ICD-10-CM | POA: Diagnosis not present

## 2024-03-15 DIAGNOSIS — F419 Anxiety disorder, unspecified: Secondary | ICD-10-CM | POA: Diagnosis present

## 2024-03-15 LAB — BASIC METABOLIC PANEL WITH GFR
Anion gap: 8 (ref 5–15)
BUN: 14 mg/dL (ref 8–23)
CO2: 35 mmol/L — ABNORMAL HIGH (ref 22–32)
Calcium: 9 mg/dL (ref 8.9–10.3)
Chloride: 93 mmol/L — ABNORMAL LOW (ref 98–111)
Creatinine, Ser: 0.68 mg/dL (ref 0.44–1.00)
GFR, Estimated: >60 mL/min (ref >60)
Glucose, Bld: 244 mg/dL — ABNORMAL HIGH (ref 70–99)
Potassium: 4.1 mmol/L (ref 3.5–5.1)
Sodium: 136 mmol/L (ref 135–145)

## 2024-03-15 LAB — RESPIRATORY PANEL BY PCR

## 2024-03-15 LAB — CBC
HCT: 47.2 % — ABNORMAL HIGH (ref 36.0–46.0)
HCT: 47.7 % — ABNORMAL HIGH (ref 36.0–46.0)
Hemoglobin: 14.2 g/dL (ref 12.0–15.0)
Hemoglobin: 14.1 g/dL (ref 12.0–15.0)
MCH: 27.4 pg (ref 26.0–34.0)
MCH: 27.6 pg (ref 26.0–34.0)
MCHC: 30.1 g/dL (ref 30.0–36.0)
MCHC: 29.6 g/dL — ABNORMAL LOW (ref 30.0–36.0)
MCV: 91.1 fL (ref 80.0–100.0)
MCV: 93.3 fL (ref 80.0–100.0)
Platelets: 202 10*3/uL (ref 150–400)
Platelets: 175 10*3/uL (ref 150–400)
RBC: 5.18 MIL/uL — ABNORMAL HIGH (ref 3.87–5.11)
RBC: 5.11 MIL/uL (ref 3.87–5.11)
RDW: 13.8 % (ref 11.5–15.5)
RDW: 13.9 % (ref 11.5–15.5)
WBC: 12.7 10*3/uL — ABNORMAL HIGH (ref 4.0–10.5)
WBC: 9.8 10*3/uL (ref 4.0–10.5)
nRBC: 0 % (ref 0.0–0.2)
nRBC: 0 % (ref 0.0–0.2)

## 2024-03-15 LAB — BLOOD GAS, VENOUS
Acid-Base Excess: 9.8 mmol/L — ABNORMAL HIGH (ref 0.0–2.0)
Bicarbonate: 41.5 mmol/L — ABNORMAL HIGH (ref 20.0–28.0)
O2 Saturation: 89.7 %
Patient temperature: 37
pCO2, Ven: 99 mmHg (ref 44–60)
pH, Ven: 7.23 — ABNORMAL LOW (ref 7.25–7.43)
pO2, Ven: 60 mmHg — ABNORMAL HIGH (ref 32–45)

## 2024-03-15 LAB — RESP PANEL BY RT-PCR (RSV, FLU A&B, COVID)  RVPGX2
Influenza A by PCR: NEGATIVE
Influenza B by PCR: NEGATIVE
Resp Syncytial Virus by PCR: NEGATIVE
SARS Coronavirus 2 by RT PCR: NEGATIVE

## 2024-03-15 LAB — CREATININE, SERUM
Creatinine, Ser: 0.62 mg/dL (ref 0.44–1.00)
GFR, Estimated: >60 mL/min (ref >60)

## 2024-03-15 LAB — BRAIN NATRIURETIC PEPTIDE: B Natriuretic Peptide: 53.2 pg/mL (ref 0.0–100.0)

## 2024-03-15 MED ORDER — FUROSEMIDE 20 MG PO TABS
20.0000 mg | ORAL_TABLET | ORAL | Status: DC
  Administered 2024-03-15 – 2024-03-17 (×2): 20 mg via ORAL
  Filled 2024-03-15 (×2): qty 1

## 2024-03-15 MED ORDER — IPRATROPIUM BROMIDE 0.02 % IN SOLN
0.5000 mg | Freq: Once | RESPIRATORY_TRACT | Status: AC
Start: 2024-03-15 — End: 2024-03-15
  Administered 2024-03-15: 0.5 mg via RESPIRATORY_TRACT
  Filled 2024-03-15: qty 2.5

## 2024-03-15 MED ORDER — ALBUTEROL SULFATE (2.5 MG/3ML) 0.083% IN NEBU
10.0000 mg/h | INHALATION_SOLUTION | Freq: Once | RESPIRATORY_TRACT | Status: AC
  Administered 2024-03-15: 10 mg/h via RESPIRATORY_TRACT
  Filled 2024-03-15: qty 12

## 2024-03-15 MED ORDER — PREDNISONE 20 MG PO TABS
40.0000 mg | ORAL_TABLET | Freq: Every day | ORAL | Status: DC
  Administered 2024-03-16: 40 mg via ORAL
  Filled 2024-03-15: qty 2

## 2024-03-15 MED ORDER — BUDESON-GLYCOPYRROL-FORMOTEROL 160-9-4.8 MCG/ACT IN AERO
2.0000 | INHALATION_SPRAY | Freq: Two times a day (BID) | RESPIRATORY_TRACT | Status: DC
  Administered 2024-03-15: 2 via RESPIRATORY_TRACT
  Filled 2024-03-15: qty 5.9

## 2024-03-15 MED ORDER — IPRATROPIUM-ALBUTEROL 0.5-2.5 (3) MG/3ML IN SOLN: 3.0000 mL | RESPIRATORY_TRACT | Status: DC | PRN

## 2024-03-15 MED ORDER — LORAZEPAM 2 MG/ML IJ SOLN
0.5000 mg | Freq: Once | INTRAMUSCULAR | Status: AC
  Administered 2024-03-15: 0.5 mg via INTRAVENOUS
  Filled 2024-03-15: qty 1

## 2024-03-15 MED ORDER — BISOPROLOL FUMARATE 5 MG PO TABS
5.0000 mg | ORAL_TABLET | Freq: Every day | ORAL | Status: DC
  Administered 2024-03-16 – 2024-03-17 (×2): 5 mg via ORAL
  Filled 2024-03-15 (×3): qty 1

## 2024-03-15 MED ORDER — LORAZEPAM 2 MG/ML IJ SOLN: 1.0000 mg | Freq: Once | INTRAMUSCULAR | Status: DC

## 2024-03-15 MED ORDER — ISOSORBIDE MONONITRATE ER 60 MG PO TB24
90.0000 mg | ORAL_TABLET | Freq: Every day | ORAL | Status: DC
  Administered 2024-03-15 – 2024-03-17 (×3): 90 mg via ORAL
  Filled 2024-03-15 (×2): qty 1
  Filled 2024-03-15 (×2): qty 3

## 2024-03-15 MED ORDER — AMLODIPINE BESYLATE 5 MG PO TABS
5.0000 mg | ORAL_TABLET | Freq: Every day | ORAL | Status: DC
  Administered 2024-03-15 – 2024-03-17 (×3): 5 mg via ORAL
  Filled 2024-03-15 (×3): qty 1

## 2024-03-15 MED ORDER — ATORVASTATIN CALCIUM 40 MG PO TABS
40.0000 mg | ORAL_TABLET | Freq: Every day | ORAL | Status: DC
  Administered 2024-03-15 – 2024-03-17 (×3): 40 mg via ORAL
  Filled 2024-03-15 (×3): qty 1

## 2024-03-15 MED ORDER — DEXAMETHASONE SODIUM PHOSPHATE 10 MG/ML IJ SOLN
10.0000 mg | Freq: Once | INTRAMUSCULAR | Status: AC
  Administered 2024-03-15: 10 mg via INTRAVENOUS
  Filled 2024-03-15: qty 1

## 2024-03-15 MED ORDER — ENOXAPARIN SODIUM 40 MG/0.4ML IJ SOSY
40.0000 mg | PREFILLED_SYRINGE | INTRAMUSCULAR | Status: DC
  Administered 2024-03-16 – 2024-03-17 (×2): 40 mg via SUBCUTANEOUS
  Filled 2024-03-15 (×3): qty 0.4

## 2024-03-15 MED ORDER — AZITHROMYCIN 250 MG PO TABS
500.0000 mg | ORAL_TABLET | Freq: Every day | ORAL | Status: AC
  Administered 2024-03-15 – 2024-03-17 (×3): 500 mg via ORAL
  Filled 2024-03-15 (×3): qty 2

## 2024-03-15 MED ORDER — MAGNESIUM SULFATE 2 GM/50ML IV SOLN
2.0000 g | Freq: Once | INTRAVENOUS | Status: AC
  Administered 2024-03-15: 2 g via INTRAVENOUS
  Filled 2024-03-15: qty 50

## 2024-03-15 MED ORDER — FLUTICASONE PROPIONATE 50 MCG/ACT NA SUSP
1.0000 | Freq: Every day | NASAL | Status: DC
Start: 2024-03-15 — End: 2024-03-17
  Administered 2024-03-16 – 2024-03-17 (×2): 1 via NASAL
  Filled 2024-03-15: qty 16

## 2024-03-15 NOTE — Progress Notes (Signed)
 RT at bedside to assess pt. Pt requested to be placed back on BiPAP. RT placed pt back on BiPAP. Pt tolerating well at this time. RT notified RN of pt.

## 2024-03-15 NOTE — ED Notes (Signed)
 EDP Horton, Jearlean Mince, MD notified on patient's elevated heart rate.

## 2024-03-15 NOTE — H&P (Addendum)
 History and Physical    Patient: [name]:[id number] DOB: 06/27/1956 DOA: 03/15/2024 DOS: the patient was seen and examined on 03/15/2024 PCP: Roslyn Coombe, MD  Patient coming from: Home  Chief Complaint:  Chief Complaint  Patient presents with   Shortness of Breath   HPI: [name] is a 68 y.o. female with medical history significant of HFpEF (EF 55-60% in 05/2022), HTN, HLD, and COPD p/w SOB and increased sputum production c/w AECOPD.  Pt states that she was in her USOH until 2 days ago when she began to have SOB/DOE that was not resolved by her pta inh. She increased her daily use of inh to no avail; as such, she presented to the ED for ongoing care. She endorses low grade fevers and lethargy.  In the ED, pt was hypertensive, tachycardic and tachypneic and placed on 4L [place] (pta 3L [place] at baseline). Labs notable for VBG 7.23/60, BNP 53, and WBC 12.7. Pt administered IV dexamethasone  10mg  x1, placed on BiPAP and admitted to PCU.  Review of Systems: As mentioned in the history of present illness. All other systems reviewed and are negative. Past Medical History:  Diagnosis Date   Bronchitis    CAD (coronary artery disease)    a. 02/2002: CABG x3 with LIMA to LAD, RIMA to RCA, and SVG to OM   CHF (congestive heart failure) (HCC)    COPD (chronic obstructive pulmonary disease) (HCC)    Diabetes (HCC)    Headache    Heart disease    Hyperlipidemia    Hypertension    Hypertensive emergency 07/01/2013   Impaired glucose tolerance 08/20/2014   Sinusitis    Past Surgical History:  Procedure Laterality Date   CORONARY ARTERY BYPASS GRAFT  2003   Triple bypass   ESOPHAGOGASTRODUODENOSCOPY Left 06/13/2014   Procedure: ESOPHAGOGASTRODUODENOSCOPY (EGD);  Surgeon: Tami Falcon, MD;  Location: [facility] ENDOSCOPY;  Service: Endoscopy;  Laterality: Left;   RIGID ESOPHAGOSCOPY N/A 06/12/2014   Procedure: RIGID ESOPHAGOSCOPY WITH FOREIGN BODY REMOVAL;  Surgeon: Lawence Press, MD;   Location: [facility] OR;  Service: ENT;  Laterality: N/A;   Social History:  reports that she quit smoking about 4 years ago. Her smoking use included cigarettes. She started smoking about 52 years ago. She has a 23.7 pack-year smoking history. She has never used smokeless tobacco. She reports current alcohol use of about 5.0 - 6.0 standard drinks of alcohol per week. She reports that she does not currently use drugs after having used the following drugs: Marijuana.  Allergies  Allergen Reactions   Chantix  [Varenicline ] Nausea And Vomiting   Augmentin  [Amoxicillin -Pot Clavulanate] Nausea And Vomiting    Family History  Problem Relation Age of Onset   Lung cancer Paternal Uncle    Heart disease Mother    Brain cancer Mother    Cancer Maternal Grandmother        colon   Stroke Other    Cancer Other        x 4 aunts   Lung cancer Maternal Aunt    Suicidality Brother     Prior to Admission medications   Medication Sig Start Date End Date Taking? Authorizing Provider  acetaminophen  (TYLENOL ) 500 MG tablet Take 1,000 mg by mouth 2 (two) times daily as needed for headache, fever, moderate pain or mild pain.   Yes [provider]  albuterol  (PROVENTIL ) (2.5 MG/3ML) 0.083% nebulizer solution Take 3 mLs (2.5 mg total) by nebulization every 6 (six) hours as needed  for wheezing or shortness of breath. 07/17/22  Yes Lind Repine, MD  albuterol  (VENTOLIN  HFA) 108 (90 Base) MCG/ACT inhaler INHALE 1-2 PUFFS BY MOUTH EVERY 6 HOURS AS NEEDED FOR WHEEZE OR SHORTNESS OF BREATH 09/26/23  Yes Lind Repine, MD  alendronate  (FOSAMAX ) 70 MG tablet Take 70 mg by mouth once a week. 12/04/23  Yes [provider]  amLODipine  (NORVASC ) 5 MG tablet TAKE 1 TABLET (5 MG TOTAL) BY MOUTH DAILY. 06/02/23  Yes Hugh Madura, MD  atorvastatin  (LIPITOR) 40 MG tablet Take 1 tablet (40 mg total) by mouth daily. 04/11/23  Yes Roslyn Coombe, MD  bisoprolol  (ZEBETA ) 5 MG tablet TAKE 1 TABLET (5 MG TOTAL) BY MOUTH  DAILY. 09/05/23  Yes Hugh Madura, MD  Cholecalciferol (VITAMIN D -3 PO) Take 1 capsule by mouth in the morning.   Yes [provider]  Cyanocobalamin  (VITAMIN B-12 PO) Take 1 tablet by mouth in the morning.   Yes [provider]  Dulaglutide  (TRULICITY ) 3 MG/0.5ML SOAJ Inject 3 mg as directed once a week. Patient taking differently: Inject 1.5 mg as directed once a week. 03/11/24  Yes Roslyn Coombe, MD  Dupilumab  (DUPIXENT ) 300 MG/2ML SOAJ Inject 300 mg into the skin every 14 (fourteen) days. 11/19/23  Yes Lind Repine, MD  fluticasone  (FLONASE ) 50 MCG/ACT nasal spray Place 1 spray into both nostrils daily. 10/22/23  Yes Vernestine Gondola, PA-C  Fluticasone -Umeclidin-Vilant (TRELEGY ELLIPTA ) 200-62.5-25 MCG/ACT AEPB INHALE 1 PUFF BY MOUTH EVERY DAY 07/05/23  Yes Lind Repine, MD  furosemide  (LASIX ) 20 MG tablet Take 20 mg by mouth 3 (three) times a week.   Yes [provider]  ipratropium-albuterol  (DUONEB) 0.5-2.5 (3) MG/3ML SOLN INHALE 3 ML BY NEBULIZER EVERY 6 HOURS AS NEEDED 09/19/23  Yes Lind Repine, MD  isosorbide  mononitrate (IMDUR ) 60 MG 24 hr tablet TAKE 1.5 TABLETS (90 MG TOTAL) BY MOUTH DAILY. 07/07/23  Yes Hugh Madura, MD  KLOR-CON  M10 10 MEQ tablet TAKE 1 TABLET BY MOUTH EVERY DAY 02/04/24  Yes Roslyn Coombe, MD  levofloxacin  (LEVAQUIN ) 500 MG tablet Take 1 tablet (500 mg total) by mouth daily. 03/12/24  Yes Roslyn Coombe, MD  MAGNESIUM  OXIDE PO Take 1 tablet by mouth at bedtime.   Yes [provider]  metFORMIN  (GLUCOPHAGE -XR) 500 MG 24 hr tablet TAKE 2 TABLET BY MOUTH EVERY DAY WITH BREAKFAST 08/31/23  Yes Roslyn Coombe, MD  Multiple Vitamins-Minerals (MULTIVITAMIN WOMEN 50+) TABS Take 1 tablet by mouth in the morning.   Yes [provider]  predniSONE  (DELTASONE ) 10 MG tablet 3 tabs by mouth per day for 3 days,2tabs per day for 3 days,1tab per day for 3 days 03/12/24  Yes Roslyn Coombe, MD    Physical Exam: Vitals:   03/15/24 0645  03/15/24 0700 03/15/24 0803 03/15/24 0830  BP: (!) 166/75 (!) 158/71  129/62  Pulse: (!) 141 (!) 137  (!) 113  Resp: (!) 38 (!) 36  (!) 29  Temp:   98.1 F (36.7 C)   TempSrc:   Axillary   SpO2: 97% 98%  100%  Weight:      Height:       General: Alert, oriented x3, resting comfortably in no acute distress Respiratory: Lungs clear to auscultation bilaterally; diffuse wheezing throughout all lung fields Cardiovascular: Regular rate and rhythm w/o m/r/g  Data Reviewed:  Lab Results  Component Value Date   WBC 12.7 (H) 03/15/2024   HGB  14.2 03/15/2024   HCT 47.2 (H) 03/15/2024   MCV 91.1 03/15/2024   PLT 202 03/15/2024   Lab Results  Component Value Date   GLUCOSE 244 (H) 03/15/2024   CALCIUM  9.0 03/15/2024   NA 136 03/15/2024   K 4.1 03/15/2024   CO2 35 (H) 03/15/2024   CL 93 (L) 03/15/2024   BUN 14 03/15/2024   CREATININE 0.68 03/15/2024   Lab Results  Component Value Date   ALT 25 03/11/2024   AST 16 03/11/2024   ALKPHOS 89 03/11/2024   BILITOT 0.3 03/11/2024   Lab Results  Component Value Date   INR 1.0 12/07/2023   INR 1.0 05/30/2023    Radiology: DG Chest 2 View Result Date: 03/15/2024 CLINICAL DATA:  Shortness of breath and dyspnea. EXAM: CHEST - 2 VIEW COMPARISON:  December 09, 2023 FINDINGS: Multiple sternal wires and vascular clips are seen. The heart size and mediastinal contours are within normal limits. Stable, diffuse, chronic appearing increased lung markings are seen. There is no evidence of an acute infiltrate, pleural effusion or pneumothorax. Multilevel degenerative changes are noted throughout the thoracic spine. IMPRESSION: 1. Evidence of prior median sternotomy/CABG. 2. Chronic appearing increased lung markings without evidence of acute or active cardiopulmonary disease. Electronically Signed   By: Virgle Grime M.D.   On: 03/15/2024 05:06    Assessment and Plan: 44F h/o HFpEF (EF 55-60% in 05/2022), HTN, HLD, and COPD p/w SOB and increased  sputum production c/w AECOPD.  AECOPD -Continue BiPAP for now -Prednisone  40mg  daily x4 days for AECOPD -Azithromycin  500mg  daily x3 days -Breztri  BID in place of home Trelegy -Duonebs prn -Wean O2 -Ambulatory pulse ox prior to d/c  HFpEF -PTA bisoprolol  5mg  daily and lasix  20mg  MWF  HTN -PTA amlodipine  5mg  daily and imdur  60mg  daily  HLD -PTA atorvastatin  40mg  daily   Advance Care Planning:   Code Status: Full Code    Consults: N/A  Family Communication: N/A  Severity of Illness: The appropriate patient status for this patient is INPATIENT. Inpatient status is judged to be reasonable and necessary in order to provide the required intensity of service to ensure the patient's safety. The patient's presenting symptoms, physical exam findings, and initial radiographic and laboratory data in the context of their chronic comorbidities is felt to place them at high risk for further clinical deterioration. Furthermore, it is not anticipated that the patient will be medically stable for discharge from the hospital within 2 midnights of admission.   * I certify that at the point of admission it is my clinical judgment that the patient will require inpatient hospital care spanning beyond 2 midnights from the point of admission due to high intensity of service, high risk for further deterioration and high frequency of surveillance required.*   ------- I spent 55 minutes reviewing previous labs/notes, obtaining separate history at the bedside, counseling/discussing the treatment plan outlined above, ordering medications/tests, and performing clinical documentation.  Author: Arne Langdon, MD 03/15/2024 10:03 AM  For on call review [email or web address].

## 2024-03-15 NOTE — ED Notes (Signed)
 This nurse notified EDP Horton, Vonzella Guernsey, MD regarding patient's elevating blood pressure, starting at 107 SBP to 195 SBP, and heart rate that has been sustaining in the 140s.

## 2024-03-15 NOTE — ED Triage Notes (Signed)
 Pt complaining of shortness of breath for 3 days. Has gotten worse tonight.

## 2024-03-15 NOTE — ED Provider Notes (Addendum)
 [name]   CSN: [id number] Arrival date & time: 03/15/24  0401     History  Chief Complaint  Patient presents with   Shortness of Breath    [name] is a 68 y.o. female.  HPI     This is a 68 year old female with history of COPD who presents with concerns for shortness of breath.  Reports worsening shortness of breath over the last 3 days.  Denies chest pain or cough.  Reports low-grade fever at home.  Has been using her nebulizers with minimal relief.  She is on chronically 3 L at home.  Currently on 4 L.  On chart review.  Patient had a similar presentation in February.  Presented tachycardic.  At that time had a negative PE study.  Tested positive for a viral illness.  Home Medications Prior to Admission medications   Medication Sig Start Date End Date Taking? Authorizing Provider  acetaminophen  (TYLENOL ) 500 MG tablet Take 1,000 mg by mouth 2 (two) times daily as needed for headache, fever, moderate pain or mild pain.   Yes [provider]  albuterol  (PROVENTIL ) (2.5 MG/3ML) 0.083% nebulizer solution Take 3 mLs (2.5 mg total) by nebulization every 6 (six) hours as needed for wheezing or shortness of breath. 07/17/22  Yes Lind Repine, MD  albuterol  (VENTOLIN  HFA) 108 (90 Base) MCG/ACT inhaler INHALE 1-2 PUFFS BY MOUTH EVERY 6 HOURS AS NEEDED FOR WHEEZE OR SHORTNESS OF BREATH 09/26/23  Yes Lind Repine, MD  alendronate  (FOSAMAX ) 70 MG tablet Take 70 mg by mouth once a week. 12/04/23  Yes [provider]  amLODipine  (NORVASC ) 5 MG tablet TAKE 1 TABLET (5 MG TOTAL) BY MOUTH DAILY. 06/02/23  Yes Hugh Madura, MD  atorvastatin  (LIPITOR) 40 MG tablet Take 1 tablet (40 mg total) by mouth daily. 04/11/23  Yes Roslyn Coombe, MD  bisoprolol  (ZEBETA ) 5 MG tablet TAKE 1 TABLET (5 MG TOTAL) BY MOUTH DAILY. 09/05/23  Yes Hugh Madura, MD  Cholecalciferol (VITAMIN D -3 PO) Take 1 capsule by mouth in the  morning.   Yes [provider]  Cyanocobalamin  (VITAMIN B-12 PO) Take 1 tablet by mouth in the morning.   Yes [provider]  Dupilumab  (DUPIXENT ) 300 MG/2ML SOAJ Inject 300 mg into the skin every 14 (fourteen) days. 11/19/23  Yes Lind Repine, MD  fluticasone  (FLONASE ) 50 MCG/ACT nasal spray Place 1 spray into both nostrils daily. 10/22/23  Yes Vernestine Gondola, PA-C  Fluticasone -Umeclidin-Vilant (TRELEGY ELLIPTA ) 200-62.5-25 MCG/ACT AEPB INHALE 1 PUFF BY MOUTH EVERY DAY 07/05/23  Yes Lind Repine, MD  furosemide  (LASIX ) 20 MG tablet Take 20 mg by mouth 3 (three) times a week.   Yes [provider]  ipratropium-albuterol  (DUONEB) 0.5-2.5 (3) MG/3ML SOLN INHALE 3 ML BY NEBULIZER EVERY 6 HOURS AS NEEDED 09/19/23  Yes Lind Repine, MD  isosorbide  mononitrate (IMDUR ) 60 MG 24 hr tablet TAKE 1.5 TABLETS (90 MG TOTAL) BY MOUTH DAILY. 07/07/23  Yes Hugh Madura, MD  KLOR-CON  M10 10 MEQ tablet TAKE 1 TABLET BY MOUTH EVERY DAY 02/04/24  Yes Roslyn Coombe, MD  MAGNESIUM  OXIDE PO Take 1 tablet by mouth at bedtime.   Yes [provider]  metFORMIN  (GLUCOPHAGE -XR) 500 MG 24 hr tablet TAKE 2 TABLET BY MOUTH EVERY DAY WITH BREAKFAST 08/31/23  Yes Roslyn Coombe, MD  Multiple Vitamins-Minerals (MULTIVITAMIN WOMEN 50+) TABS Take 1 tablet by mouth in  the morning.   Yes [provider]  predniSONE  (DELTASONE ) 10 MG tablet 3 tabs by mouth per day for 3 days,2tabs per day for 3 days,1tab per day for 3 days 03/12/24  Yes Roslyn Coombe, MD  Dulaglutide  (TRULICITY ) 3 MG/0.5ML SOAJ Inject 3 mg as directed once a week. 03/11/24   Roslyn Coombe, MD  levofloxacin  (LEVAQUIN ) 500 MG tablet Take 1 tablet (500 mg total) by mouth daily. 03/12/24   Roslyn Coombe, MD      Allergies    Chantix  [varenicline ] and Augmentin  [amoxicillin -pot clavulanate]    Review of Systems   Review of Systems  Constitutional:  Positive for fever.  Respiratory:  Positive for cough, shortness of breath  and wheezing.   Cardiovascular:  Negative for chest pain and leg swelling.  All other systems reviewed and are negative.   Physical Exam Updated Vital Signs BP (!) 168/76   Pulse (!) 142   Temp 98.2 F (36.8 C) (Oral)   Resp (!) 34   Ht 1.549 m (5\' 1" )   Wt 87.1 kg   SpO2 95%   BMI 36.28 kg/m  Physical Exam Vitals and nursing Fleming reviewed.  Constitutional:      Appearance: She is well-developed. She is ill-appearing. She is not toxic-appearing.  HENT:     Head: Normocephalic and atraumatic.  Eyes:     Pupils: Pupils are equal, round, and reactive to light.  Cardiovascular:     Rate and Rhythm: Regular rhythm. Tachycardia present.     Heart sounds: Normal heart sounds.  Pulmonary:     Effort: Pulmonary effort is normal. No respiratory distress.     Breath sounds: Wheezing present.     Comments: Speaking in short sentences, splinting, tachypnea, poor air movement, end expiratory wheezing bilaterally Abdominal:     General: Bowel sounds are normal.     Palpations: Abdomen is soft.  Musculoskeletal:     Cervical back: Neck supple.     Right lower leg: No edema.     Left lower leg: No edema.  Skin:    General: Skin is warm and dry.  Neurological:     Mental Status: She is alert and oriented to person, place, and time.  Psychiatric:        Mood and Affect: Mood normal.     ED Results / Procedures / Treatments   Labs (all labs ordered are listed, but only abnormal results are displayed) Labs Reviewed  BASIC METABOLIC PANEL WITH GFR - Abnormal; Notable for the following components:      Result Value   Chloride 93 (*)    CO2 35 (*)    Glucose, Bld 244 (*)    All other components within normal limits  CBC - Abnormal; Notable for the following components:   WBC 12.7 (*)    RBC 5.18 (*)    HCT 47.2 (*)    All other components within normal limits  RESP PANEL BY RT-PCR (RSV, FLU A&B, COVID)  RVPGX2  BRAIN NATRIURETIC PEPTIDE    EKG EKG  Interpretation Date/Time:  Monday Mar 15 2024 04:32:39 EDT Ventricular Rate:  137 PR Interval:  148 QRS Duration:  61 QT Interval:  335 QTC Calculation: 506 R Axis:   95  Text Interpretation: Sinus tachycardia Multiform ventricular premature complexes Aberrant conduction of SV complex(es) Consider right atrial enlargement Right axis deviation Borderline T wave abnormalities Prolonged QT interval Confirmed by Donita Furrow ([id number]) on 03/15/2024 4:47:00 AM  Radiology DG Chest  2 View Result Date: 03/15/2024 CLINICAL DATA:  Shortness of breath and dyspnea. EXAM: CHEST - 2 VIEW COMPARISON:  December 09, 2023 FINDINGS: Multiple sternal wires and vascular clips are seen. The heart size and mediastinal contours are within normal limits. Stable, diffuse, chronic appearing increased lung markings are seen. There is no evidence of an acute infiltrate, pleural effusion or pneumothorax. Multilevel degenerative changes are noted throughout the thoracic spine. IMPRESSION: 1. Evidence of prior median sternotomy/CABG. 2. Chronic appearing increased lung markings without evidence of acute or active cardiopulmonary disease. Electronically Signed   By: Virgle Grime M.D.   On: 03/15/2024 05:06    Procedures .Critical Care  Performed by: Rory Collard, MD Authorized by: Rory Collard, MD   Critical care provider statement:    Critical care time (minutes):  45   Critical care was necessary to treat or prevent imminent or life-threatening deterioration of the following conditions:  Respiratory failure   Critical care was time spent personally by me on the following activities:  Development of treatment plan with patient or surrogate, discussions with consultants, evaluation of patient's response to treatment, examination of patient, ordering and review of laboratory studies, ordering and review of radiographic studies, ordering and performing treatments and interventions, pulse oximetry,  re-evaluation of patient's condition and review of old charts     Medications Ordered in ED Medications  albuterol  (PROVENTIL ) (2.5 MG/3ML) 0.083% nebulizer solution (10 mg/hr Nebulization Given 03/15/24 0513)  ipratropium (ATROVENT ) nebulizer solution 0.5 mg (0.5 mg Nebulization Given 03/15/24 0513)  dexamethasone  (DECADRON ) injection 10 mg (10 mg Intravenous Given 03/15/24 0454)  magnesium  sulfate IVPB 2 g 50 mL (0 g Intravenous Stopped 03/15/24 0515)  LORazepam (ATIVAN) injection 0.5 mg (0.5 mg Intravenous Given 03/15/24 0542)    ED Course/ Medical Decision Making/ A&P Clinical Course as of 03/15/24 0652  Mon Mar 15, 2024  0605 Patient looks somewhat more comfortable on BiPAP.  Remains tachycardic and tachypneic.  Better air movement but continued wheezing.  Will continue to monitor. [CH]  [phone] Spoke to Dr. Michell Ahumada regarding admission. [CH]    Clinical Course User Index [CH] Brynnlee Cumpian, Vonzella Guernsey, MD                                 Medical Decision Making Amount and/or Complexity of Data Reviewed Labs: ordered. Radiology: ordered.  Risk Prescription drug management. Decision regarding hospitalization.   This patient presents to the ED for concern of SOB, this involves an extensive number of treatment options, and is a complaint that carries with it a high risk of complications and morbidity.  I considered the following differential and admission for this acute, potentially life threatening condition.  The differential diagnosis includes pneumonia, COPD exacerbation, pneumothorax, ACS, PE  MDM:    This is a 68 year old female with history of chronic respiratory failure on 3 L of oxygen  who presents with shortness of breath.  She is tachycardic and tachypneic.  She had a similar presentation back in February.  She did use DuoNebs prior to arrival.  She is not having any chest pain.  EKG shows no evidence of acute ischemia or arrhythmia.  Patient was given Decadron , continuous DuoNeb,  ipratropium, magnesium .  She was started on BiPAP for work of breathing.  Negative COVID and influenza.  Chest x-ray without pneumothorax or pneumonia.  Nothing acute.  While patient is significantly tachycardic, this is very similar to her presentation in  February and she had a negative PE study at this time.  Given wheezing, would highly favor COPD or acute bronchitis over PE.  Will defer further imaging at this time.  Will plan for admission to the hospitalist.  (Labs, imaging, consults)  Labs: I Ordered, and personally interpreted labs.  The pertinent results include: CBC, BMP, BNP COVID, influenza  Imaging Studies ordered: I ordered imaging studies including chest x-ray I independently visualized and interpreted imaging. I agree with the radiologist interpretation  Additional history obtained from chart review.  External records from outside source obtained and reviewed including discharge summary  Cardiac Monitoring: The patient was maintained on a cardiac monitor.  If on the cardiac monitor, I personally viewed and interpreted the cardiac monitored which showed an underlying rhythm of: Tachycardia  Reevaluation: After the interventions noted above, I reevaluated the patient and found that they have :stayed the same  Social Determinants of Health:  lives independently  Disposition: Admit  Co morbidities that complicate the patient evaluation  Past Medical History:  Diagnosis Date   Bronchitis    CAD (coronary artery disease)    a. 02/2002: CABG x3 with LIMA to LAD, RIMA to RCA, and SVG to OM   CHF (congestive heart failure) (HCC)    COPD (chronic obstructive pulmonary disease) (HCC)    Diabetes (HCC)    Headache    Heart disease    Hyperlipidemia    Hypertension    Hypertensive emergency 07/01/2013   Impaired glucose tolerance 08/20/2014   Sinusitis      Medicines Meds ordered this encounter  Medications   albuterol  (PROVENTIL ) (2.5 MG/3ML) 0.083% nebulizer  solution   ipratropium (ATROVENT ) nebulizer solution 0.5 mg   dexamethasone  (DECADRON ) injection 10 mg   magnesium  sulfate IVPB 2 g 50 mL   DISCONTD: LORazepam  (ATIVAN ) injection 1 mg   LORazepam  (ATIVAN ) injection 0.5 mg    I have reviewed the patients home medicines and have made adjustments as needed  Problem List / ED Course: Problem List Items Addressed This Visit       Respiratory   Chronic obstructive lung disease (HCC)   Other Visit Diagnoses       Acute on chronic respiratory failure with hypoxia (HCC)    -  Primary                   Final Clinical Impression(s) / ED Diagnoses Final diagnoses:  Acute on chronic respiratory failure with hypoxia (HCC)  Chronic obstructive pulmonary disease with acute exacerbation [facility])    Rx / DC Orders ED Discharge Orders     None         Mattheo Swindle, Vonzella Guernsey, MD 03/15/24 0454    Rory Collard, MD 03/15/24 [phone]

## 2024-03-15 NOTE — Plan of Care (Signed)

## 2024-03-15 NOTE — Progress Notes (Signed)
 Patient placed on [place] of  3L to give break from BiPAP.  RT advised patient in the event of distress that BiPAP will be restarted.  RT made RN aware.

## 2024-03-16 ENCOUNTER — Other Ambulatory Visit: Payer: Self-pay

## 2024-03-16 DIAGNOSIS — J9622 Acute and chronic respiratory failure with hypercapnia: Secondary | ICD-10-CM | POA: Insufficient documentation

## 2024-03-16 DIAGNOSIS — J441 Chronic obstructive pulmonary disease with (acute) exacerbation: Secondary | ICD-10-CM

## 2024-03-16 DIAGNOSIS — J9611 Chronic respiratory failure with hypoxia: Secondary | ICD-10-CM | POA: Insufficient documentation

## 2024-03-16 LAB — CBC
HCT: 46 % (ref 36.0–46.0)
Hemoglobin: 14 g/dL (ref 12.0–15.0)
MCH: 28.1 pg (ref 26.0–34.0)
MCHC: 30.4 g/dL (ref 30.0–36.0)
MCV: 92.4 fL (ref 80.0–100.0)
Platelets: 172 10*3/uL (ref 150–400)
RBC: 4.98 MIL/uL (ref 3.87–5.11)
RDW: 13.5 % (ref 11.5–15.5)
WBC: 7.4 10*3/uL (ref 4.0–10.5)
nRBC: 0 % (ref 0.0–0.2)

## 2024-03-16 LAB — RENAL FUNCTION PANEL
Albumin: 3.6 g/dL (ref 3.5–5.0)
Anion gap: 11 (ref 5–15)
BUN: 24 mg/dL — ABNORMAL HIGH (ref 8–23)
CO2: 38 mmol/L — ABNORMAL HIGH (ref 22–32)
Calcium: 9.5 mg/dL (ref 8.9–10.3)
Chloride: 89 mmol/L — ABNORMAL LOW (ref 98–111)
Creatinine, Ser: 0.59 mg/dL (ref 0.44–1.00)
GFR, Estimated: >60 mL/min (ref >60)
Glucose, Bld: 174 mg/dL — ABNORMAL HIGH (ref 70–99)
Phosphorus: 4.3 mg/dL (ref 2.5–4.6)
Potassium: 5.2 mmol/L — ABNORMAL HIGH (ref 3.5–5.1)
Sodium: 138 mmol/L (ref 135–145)

## 2024-03-16 LAB — BLOOD GAS, VENOUS
Acid-Base Excess: 14.9 mmol/L — ABNORMAL HIGH (ref 0.0–2.0)
Bicarbonate: 45.9 mmol/L — ABNORMAL HIGH (ref 20.0–28.0)
O2 Saturation: 99.7 %
Patient temperature: 36.8
pCO2, Ven: 88 mmHg (ref 44–60)
pH, Ven: 7.32 (ref 7.25–7.43)
pO2, Ven: 145 mmHg — ABNORMAL HIGH (ref 32–45)

## 2024-03-16 LAB — GLUCOSE, CAPILLARY
Glucose-Capillary: 218 mg/dL — ABNORMAL HIGH (ref 70–99)
Glucose-Capillary: 217 mg/dL — ABNORMAL HIGH (ref 70–99)
Glucose-Capillary: 257 mg/dL — ABNORMAL HIGH (ref 70–99)
Glucose-Capillary: 164 mg/dL — ABNORMAL HIGH (ref 70–99)

## 2024-03-16 LAB — MAGNESIUM: Magnesium: 2.5 mg/dL — ABNORMAL HIGH (ref 1.7–2.4)

## 2024-03-16 MED ORDER — BUDESONIDE 0.5 MG/2ML IN SUSP
0.5000 mg | Freq: Two times a day (BID) | RESPIRATORY_TRACT | Status: DC
  Administered 2024-03-16: 0.5 mg via RESPIRATORY_TRACT
  Filled 2024-03-16 (×2): qty 2

## 2024-03-16 MED ORDER — ARFORMOTEROL TARTRATE 15 MCG/2ML IN NEBU
15.0000 ug | INHALATION_SOLUTION | Freq: Two times a day (BID) | RESPIRATORY_TRACT | Status: DC
  Administered 2024-03-16: 15 ug via RESPIRATORY_TRACT
  Filled 2024-03-16 (×2): qty 2

## 2024-03-16 MED ORDER — ACETAMINOPHEN 325 MG PO TABS
650.0000 mg | ORAL_TABLET | Freq: Four times a day (QID) | ORAL | Status: DC | PRN
  Administered 2024-03-16: 650 mg via ORAL
  Filled 2024-03-16 (×2): qty 2

## 2024-03-16 MED ORDER — INSULIN ASPART 100 UNIT/ML IJ SOLN
0.0000 [IU] | Freq: Three times a day (TID) | INTRAMUSCULAR | Status: DC
  Administered 2024-03-16: 8 [IU] via SUBCUTANEOUS
  Administered 2024-03-16 (×2): 5 [IU] via SUBCUTANEOUS
  Administered 2024-03-17: 3 [IU] via SUBCUTANEOUS

## 2024-03-16 MED ORDER — INSULIN ASPART 100 UNIT/ML IJ SOLN: 0.0000 [IU] | Freq: Every day | INTRAMUSCULAR | Status: DC

## 2024-03-16 MED ORDER — INSULIN GLARGINE-YFGN 100 UNIT/ML ~~LOC~~ SOLN
15.0000 [IU] | Freq: Every day | SUBCUTANEOUS | Status: DC
  Administered 2024-03-16 – 2024-03-17 (×2): 15 [IU] via SUBCUTANEOUS
  Filled 2024-03-16 (×2): qty 0.15

## 2024-03-16 MED ORDER — METHYLPREDNISOLONE SODIUM SUCC 40 MG IJ SOLR
40.0000 mg | Freq: Two times a day (BID) | INTRAMUSCULAR | Status: DC
  Administered 2024-03-16 – 2024-03-17 (×2): 40 mg via INTRAVENOUS
  Filled 2024-03-16 (×2): qty 1

## 2024-03-16 MED ORDER — LORAZEPAM 0.5 MG PO TABS
0.5000 mg | ORAL_TABLET | Freq: Three times a day (TID) | ORAL | Status: DC | PRN
  Administered 2024-03-16: 0.5 mg via ORAL
  Filled 2024-03-16: qty 1

## 2024-03-16 MED ORDER — SODIUM ZIRCONIUM CYCLOSILICATE 10 G PO PACK
10.0000 g | PACK | Freq: Once | ORAL | Status: AC
  Administered 2024-03-16: 10 g via ORAL
  Filled 2024-03-16: qty 1

## 2024-03-16 MED ORDER — REVEFENACIN 175 MCG/3ML IN SOLN
175.0000 ug | Freq: Every day | RESPIRATORY_TRACT | Status: DC
  Filled 2024-03-16: qty 3

## 2024-03-16 MED ORDER — INSULIN ASPART 100 UNIT/ML IJ SOLN
3.0000 [IU] | Freq: Three times a day (TID) | INTRAMUSCULAR | Status: DC
  Administered 2024-03-16 – 2024-03-17 (×4): 3 [IU] via SUBCUTANEOUS

## 2024-03-16 NOTE — TOC CM/SW Note (Signed)
 Transition of Care [facility]) - Inpatient Brief Assessment   Patient Details  Name: [name] MRN: [id number] Date of Birth: Aug 23, 1956  Transition of Care [facility]) CM/SW Contact:    Jennett Model, RN Phone Number: 03/16/2024, 12:57 PM   Clinical Narrative: From home with sister, has PCP and insurance on file, states has no HH services in place at this time, has cane, cpap machine and home oxygen  3 liters with adapt at home.  States sister  will transport them home at dc and family is support system, states gets medications from [facility] on [address]  Pta self ambulatory.    Transition of Care Asessment: Insurance and Status: Insurance coverage has been reviewed Patient has primary care physician: Yes Home environment has been reviewed: lives with sister Prior level of function:: indep Prior/Current Home Services: Current home services (cane, cpap machine, home oxygen  3 liters with [employer]) Social Drivers of Health Review: SDOH reviewed no interventions necessary Readmission risk has been reviewed: Yes Transition of care needs: no transition of care needs at this time

## 2024-03-16 NOTE — Plan of Care (Signed)

## 2024-03-16 NOTE — Progress Notes (Signed)
 RT at bedside to check on pt. RT found pt back on BiPAP. Per pt, she was placed on about ago. Pt tolerating well at this time with SVS.

## 2024-03-16 NOTE — Progress Notes (Signed)
 Mobility Specialist Progress Note:   03/16/24 1530  Mobility  Activity Ambulated with assistance in hallway  Level of Assistance Contact guard assist, steadying assist  Assistive Device None  Distance Ambulated (ft) 30 ft  Activity Response Tolerated fair  Mobility Referral Yes  Mobility visit 1 Mobility  Mobility Specialist Start Time (ACUTE ONLY) 1530  Mobility Specialist Stop Time (ACUTE ONLY) 1542  Mobility Specialist Time Calculation (min) (ACUTE ONLY) 12 min   Pt agreeable to mobility session. Required CGA for safety. Pt only able to take couple steps before stopping to catcher her breath. Ambulated on 4LO2, SpO2 90%. Left in chair with all needs met, visitor present.   Oneda Big Mobility Specialist Please contact via SecureChat or  Rehab office at [phone]

## 2024-03-16 NOTE — Progress Notes (Signed)
 PROGRESS NOTE  [name]:[id number] DOB: [birth year]   PCP: Roslyn Coombe, MD  Patient is from: Home.  Lives with sister.  Independently ambulates at baseline.  DOA: 03/15/2024 LOS: 1  Chief complaints Chief Complaint  Patient presents with   Shortness of Breath     Brief Narrative / Interim history: 68 year old M with PMH of HFpEF, COPD, chronic hypoxic RF on 3 to 4 L and CPAP, CAD/CABG, HTN, HLD, DM-2 and tobacco use disorder presenting with shortness of breath and increased sputum production, and admitted with acute on chronic respiratory failure with hypoxia and hypercapnia in the setting of COPD exacerbation.  Recently treated outpatient with Levaquin  and prednisone  for COPD exacerbation.  Reports smoking 3-4 cigarettes a day.  Reports using her Trelegy consistently.  In ED, hypertensive, tachycardic and tachypneic and placed on 4L [place]. Labs notable for VBG 7.23/99/60/41 suggesting acute on chronic hypercapnic respiratory failure. BNP 53, and WBC 12.7.  2 view CXR without acute finding.  Pt administered IV dexamethasone  10mg  x1, placed on BiPAP and admitted to progressive care unit.   Subjective: Seen and examined earlier this morning.  No major events overnight or this morning. She was on nasal cannula earlier but went back on BiPAP due to dyspnea.  She appears anxious.  She denies cough or chest pain.  Patient's sister and niece at bedside.  Reports smoking about 3 to 4 cigarettes a day.  Reports compliance with inhalers.  Objective: Vitals:   03/16/24 0735 03/16/24 0801 03/16/24 1125 03/16/24 1129  BP: 129/64     Pulse: 76 91    Resp: 20 (!) 21    Temp: (!) 97 F (36.1 C)   (!) 97.4 F (36.3 C)  TempSrc: Axillary   Axillary  SpO2: 99% 95% 98%   Weight:      Height:        Examination:  GENERAL: No apparent distress.  Nontoxic. HEENT: MMM.  Vision and hearing grossly intact.  NECK: Supple.  No apparent JVD.  RESP: On BiPAP.  Tachypneic.  Diminished aeration  bilaterally.  On BiPAP. CVS:  RRR. Heart sounds normal.  ABD/GI/GU: BS+. Abd soft, NTND.  MSK/EXT:  Moves extremities. No apparent deformity. No edema.  SKIN: no apparent skin lesion or wound NEURO: Awake and alert.  Oriented appropriately.  No apparent focal neuro deficit. PSYCH: Sitting up in bed.  Appears anxious.  Consultants:  None  Procedures: None  Microbiology summarized: COVID-19, influenza, RSV and a 20 pathogen RVP nonreactive  Assessment and plan: Acute on chronic hypercapnic respiratory failure: VBG 7.23/99/60/41.5 suggesting acute on chronic Chronic hypoxic respiratory failure-uses 3 to 4 L at baseline. COPD exacerbation-presenting with SOB and increased secretion although she denies cough today. -Tachypneic and tachycardic on presentation requiring BiPAP for respiratory distress -Two view CXR without acute finding.  BNP within normal.  Appears euvolemic -Switch prednisone  to Solu-Medrol , inhalers to nebulizers -She is very anxious.  Trial of low-dose Ativan cautiously -Minimum oxygen  to keep saturation above 88% when off BiPAP.  Discussed with patient and RN -BiPAP as needed.   -Encouraged smoking cessation.  Chronic HFpEF: Appears euvolemic on exam. - Continue home Lasix  and bisoprolol  - Monitor fluid status  History of CAD/CABG: No anginal symptoms. - Continue home meds-bisoprolol , Imdur , Lipitor  NIDDM-2 with hyperglycemia: A1c 7.4% on 5/22.  Hyperglycemia likely due to steroid.  On low-dose metformin  at home. Recent Labs  Lab 03/16/24 0839  GLUCAP 218*  - Start Semglee  15 units daily - Start  NovoLog  3 units 3 times daily with meals - Start SSI-moderate  Essential hypertension - Continue home amlodipine , bisoprolol  and Imdur   Sinus tachycardia: Resolving - Continue bisoprolol   Hyperkalemia: Mild - P.o. Lokelma 10 g x 1  Tobacco use disorder: Reports smoking 3 to 4 cigarettes a day - Encourage cessation  HLD - Continue statin  Anxiety:  Appears very anxious contributing to her breathing. - Trial of low-dose Ativan cautiously  Class I obesity Body mass index is 34.16 kg/m.           DVT prophylaxis:  enoxaparin  (LOVENOX ) injection 40 mg Start: 03/15/24 1000 SCDs Start: 03/15/24 1000  Code Status: Full code Family Communication: Updated patient's sister and niece at bedside Level of care: Progressive Status is: Inpatient Remains inpatient appropriate because: Acute on chronic hypercapnic respiratory failure, COPD exacerbation   Final disposition: Home   55 minutes with more than 50% spent in reviewing records, counseling patient/family and coordinating care.   Sch Meds:  Scheduled Meds:  amLODipine   5 mg Oral Daily   arformoterol   15 mcg Nebulization BID   atorvastatin   40 mg Oral Daily   azithromycin   500 mg Oral Daily   bisoprolol   5 mg Oral Daily   budesonide  (PULMICORT ) nebulizer solution  0.5 mg Nebulization BID   enoxaparin  (LOVENOX ) injection  40 mg Subcutaneous Q24H   fluticasone   1 spray Each Nare Daily   furosemide   20 mg Oral Once per day on Monday Wednesday Friday   insulin  aspart  0-15 Units Subcutaneous TID WC   insulin  aspart  0-5 Units Subcutaneous QHS   insulin  aspart  3 Units Subcutaneous TID WC   insulin  glargine-yfgn  15 Units Subcutaneous Daily   isosorbide  mononitrate  90 mg Oral Daily   methylPREDNISolone  (SOLU-MEDROL ) injection  40 mg Intravenous Q12H   revefenacin   175 mcg Nebulization Daily   sodium zirconium cyclosilicate  10 g Oral Once   Continuous Infusions: PRN Meds:.acetaminophen , ipratropium-albuterol , LORazepam  Antimicrobials: Anti-infectives (From admission, onward)    Start     Dose/Rate Route Frequency Ordered Stop   03/15/24 1000  azithromycin  (ZITHROMAX ) tablet 500 mg        500 mg Oral Daily 03/15/24 0956 03/18/24 0959        I have personally reviewed the following labs and images: CBC: Recent Labs  Lab 03/11/24 1031 03/15/24 0419  03/15/24 1047 03/16/24 0729  WBC 9.8 12.7* 9.8 7.4  NEUTROABS 8.0*  --   --   --   HGB 13.3 14.2 14.1 14.0  HCT 41.5 47.2* 47.7* 46.0  MCV 86.0 91.1 93.3 92.4  PLT 207.0 202 175 172   BMP &GFR Recent Labs  Lab 03/11/24 1031 03/15/24 0419 03/15/24 1047 03/16/24 0729  NA 142 136  --  138  K 4.3 4.1  --  5.2*  CL 99 93*  --  89*  CO2 39* 35*  --  38*  GLUCOSE 194* 244*  --  174*  BUN 10 14  --  24*  CREATININE 0.58 0.68 0.62 0.59  CALCIUM  9.5 9.0  --  9.5  MG  --   --   --  2.5*  PHOS  --   --   --  4.3   Estimated Creatinine Clearance: 65.3 mL/min (by C-G formula based on SCr of 0.59 mg/dL). Liver & Pancreas: Recent Labs  Lab 03/11/24 1031 03/16/24 0729  AST 16  --   ALT 25  --   ALKPHOS 89  --  BILITOT 0.3  --   PROT 6.8  --   ALBUMIN 4.4 3.6   No results for input(s): "LIPASE", "AMYLASE" in the last 168 hours. No results for input(s): "AMMONIA" in the last 168 hours. Diabetic: No results for input(s): "HGBA1C" in the last 72 hours. Recent Labs  Lab 03/16/24 0839  GLUCAP 218*   Cardiac Enzymes: No results for input(s): "CKTOTAL", "CKMB", "CKMBINDEX", "TROPONINI" in the last 168 hours. No results for input(s): "PROBNP" in the last 8760 hours. Coagulation Profile: No results for input(s): "INR", "PROTIME" in the last 168 hours. Thyroid  Function Tests: No results for input(s): "TSH", "T4TOTAL", "FREET4", "T3FREE", "THYROIDAB" in the last 72 hours. Lipid Profile: No results for input(s): "CHOL", "HDL", "LDLCALC", "TRIG", "CHOLHDL", "LDLDIRECT" in the last 72 hours. Anemia Panel: No results for input(s): "VITAMINB12", "FOLATE", "FERRITIN", "TIBC", "IRON", "RETICCTPCT" in the last 72 hours. Urine analysis:    Component Value Date/Time   COLORURINE YELLOW 03/11/2024 1031   APPEARANCEUR CLEAR 03/11/2024 1031   LABSPEC 1.015 03/11/2024 1031   PHURINE 6.0 03/11/2024 1031   GLUCOSEU NEGATIVE 03/11/2024 1031   HGBUR NEGATIVE 03/11/2024 1031   BILIRUBINUR  NEGATIVE 03/11/2024 1031   BILIRUBINUR negative 02/24/2023 0924   KETONESUR NEGATIVE 03/11/2024 1031   PROTEINUR 30 (A) 02/27/2023 0620   UROBILINOGEN 0.2 03/11/2024 1031   NITRITE NEGATIVE 03/11/2024 1031   LEUKOCYTESUR NEGATIVE 03/11/2024 1031   Sepsis Labs: Invalid input(s): "PROCALCITONIN", "LACTICIDVEN"  Microbiology: Recent Results (from the past 240 hours)  Resp panel by RT-PCR (RSV, Flu A&B, Covid) Anterior Nasal Swab     Status: None   Collection Time: 03/15/24  4:59 AM   Specimen: Anterior Nasal Swab  Result Value Ref Range Status   SARS Coronavirus 2 by RT PCR NEGATIVE NEGATIVE Final   Influenza A by PCR NEGATIVE NEGATIVE Final   Influenza B by PCR NEGATIVE NEGATIVE Final    Comment: (NOTE) The Xpert Xpress SARS-CoV-2/FLU/RSV plus assay is intended as an aid in the diagnosis of influenza from Nasopharyngeal swab specimens and should not be used as a sole basis for treatment. Nasal washings and aspirates are unacceptable for Xpert Xpress SARS-CoV-2/FLU/RSV testing.  Fact Sheet for Patients: BloggerCourse.com  Fact Sheet for Healthcare Providers: SeriousBroker.it  This test is not yet approved or cleared by the [place]  FDA and has been authorized for detection and/or diagnosis of SARS-CoV-2 by FDA under an Emergency Use Authorization (EUA). This EUA will remain in effect (meaning this test can be used) for the duration of the COVID-19 declaration under Section 564(b)(1) of the Act, 21 U.S.C. section 360bbb-3(b)(1), unless the authorization is terminated or revoked.     Resp Syncytial Virus by PCR NEGATIVE NEGATIVE Final    Comment: (NOTE) Fact Sheet for Patients: BloggerCourse.com  Fact Sheet for Healthcare Providers: SeriousBroker.it  This test is not yet approved or cleared by the [place]  FDA and has been authorized for detection and/or diagnosis  of SARS-CoV-2 by FDA under an Emergency Use Authorization (EUA). This EUA will remain in effect (meaning this test can be used) for the duration of the COVID-19 declaration under Section 564(b)(1) of the Act, 21 U.S.C. section 360bbb-3(b)(1), unless the authorization is terminated or revoked.  Performed at [facility], [address] [place], [place] [address]   Respiratory (~20 pathogens) panel by PCR     Status: None   Collection Time: 03/15/24  4:59 AM   Specimen: Nasopharyngeal Swab; Respiratory  Result Value Ref Range Status   Adenovirus NOT DETECTED  NOT DETECTED Final   Coronavirus 229E NOT DETECTED NOT DETECTED Final    Comment: (NOTE) The Coronavirus on the Respiratory Panel, DOES NOT test for the novel  Coronavirus (2019 nCoV)    Coronavirus HKU1 NOT DETECTED NOT DETECTED Final   Coronavirus NL63 NOT DETECTED NOT DETECTED Final   Coronavirus OC43 NOT DETECTED NOT DETECTED Final   Metapneumovirus NOT DETECTED NOT DETECTED Final   Rhinovirus / Enterovirus NOT DETECTED NOT DETECTED Final   Influenza A NOT DETECTED NOT DETECTED Final   Influenza B NOT DETECTED NOT DETECTED Final   Parainfluenza Virus 1 NOT DETECTED NOT DETECTED Final   Parainfluenza Virus 2 NOT DETECTED NOT DETECTED Final   Parainfluenza Virus 3 NOT DETECTED NOT DETECTED Final   Parainfluenza Virus 4 NOT DETECTED NOT DETECTED Final   Respiratory Syncytial Virus NOT DETECTED NOT DETECTED Final   Bordetella pertussis NOT DETECTED NOT DETECTED Final   Bordetella Parapertussis NOT DETECTED NOT DETECTED Final   Chlamydophila pneumoniae NOT DETECTED NOT DETECTED Final   Mycoplasma pneumoniae NOT DETECTED NOT DETECTED Final    Comment: Performed at [facility], [address] [place], [place] [address]    Radiology Studies: No results found.    Jeremaine Maraj T. Kasper Mudrick [facility]  If 7PM-7AM, please contact night-coverage [email or web address] 03/16/2024, 1:08 PM

## 2024-03-16 NOTE — Progress Notes (Signed)
 RT at bedside to check on pt. RT found pt on [place] and BiPAP on stby at bedside. Pt tolerating well with SVS and no complications.

## 2024-03-16 NOTE — Progress Notes (Signed)
 RN informed RT that she removed pt from BiPAP and placed on [place]. RT at bedside to assess pt and give AM breathing tx. While at bedside, pt felt that she could not feel the oxygen . RT increased [place] to 6L. Pt felt better after increased O2 needs and sitting on side of bed. Pt refused breathing tx. BiPAP (servo) on stby at bedside.

## 2024-03-17 DIAGNOSIS — I2510 Atherosclerotic heart disease of native coronary artery without angina pectoris: Secondary | ICD-10-CM

## 2024-03-17 DIAGNOSIS — K219 Gastro-esophageal reflux disease without esophagitis: Secondary | ICD-10-CM

## 2024-03-17 DIAGNOSIS — J9622 Acute and chronic respiratory failure with hypercapnia: Secondary | ICD-10-CM | POA: Diagnosis not present

## 2024-03-17 DIAGNOSIS — J441 Chronic obstructive pulmonary disease with (acute) exacerbation: Secondary | ICD-10-CM | POA: Diagnosis not present

## 2024-03-17 DIAGNOSIS — Z6834 Body mass index (BMI) 34.0-34.9, adult: Secondary | ICD-10-CM

## 2024-03-17 DIAGNOSIS — J9611 Chronic respiratory failure with hypoxia: Secondary | ICD-10-CM | POA: Diagnosis not present

## 2024-03-17 DIAGNOSIS — E66811 Obesity, class 1: Secondary | ICD-10-CM

## 2024-03-17 LAB — GLUCOSE, CAPILLARY
Glucose-Capillary: 176 mg/dL — ABNORMAL HIGH (ref 70–99)
Glucose-Capillary: 237 mg/dL — ABNORMAL HIGH (ref 70–99)

## 2024-03-17 LAB — RENAL FUNCTION PANEL
Albumin: 3.7 g/dL (ref 3.5–5.0)
Anion gap: 13 (ref 5–15)
BUN: 26 mg/dL — ABNORMAL HIGH (ref 8–23)
CO2: 36 mmol/L — ABNORMAL HIGH (ref 22–32)
Calcium: 9.5 mg/dL (ref 8.9–10.3)
Chloride: 92 mmol/L — ABNORMAL LOW (ref 98–111)
Creatinine, Ser: 0.62 mg/dL (ref 0.44–1.00)
GFR, Estimated: >60 mL/min (ref >60)
Glucose, Bld: 185 mg/dL — ABNORMAL HIGH (ref 70–99)
Phosphorus: 4.4 mg/dL (ref 2.5–4.6)
Potassium: 5.1 mmol/L (ref 3.5–5.1)
Sodium: 141 mmol/L (ref 135–145)

## 2024-03-17 LAB — CBC
HCT: 47.1 % — ABNORMAL HIGH (ref 36.0–46.0)
Hemoglobin: 14.1 g/dL (ref 12.0–15.0)
MCH: 27.5 pg (ref 26.0–34.0)
MCHC: 29.9 g/dL — ABNORMAL LOW (ref 30.0–36.0)
MCV: 92 fL (ref 80.0–100.0)
Platelets: 177 10*3/uL (ref 150–400)
RBC: 5.12 MIL/uL — ABNORMAL HIGH (ref 3.87–5.11)
RDW: 13.3 % (ref 11.5–15.5)
WBC: 9.7 10*3/uL (ref 4.0–10.5)
nRBC: 0 % (ref 0.0–0.2)

## 2024-03-17 LAB — MAGNESIUM: Magnesium: 2.5 mg/dL — ABNORMAL HIGH (ref 1.7–2.4)

## 2024-03-17 MED ORDER — SODIUM ZIRCONIUM CYCLOSILICATE 10 G PO PACK
10.0000 g | PACK | Freq: Once | ORAL | Status: AC
  Administered 2024-03-17: 10 g via ORAL
  Filled 2024-03-17: qty 1

## 2024-03-17 MED ORDER — PREDNISONE 10 MG PO TABS: ORAL_TABLET | ORAL | 0 refills | Status: DC

## 2024-03-17 NOTE — Evaluation (Addendum)
 Physical Therapy Evaluation Patient Details Name: [name] MRN: [id number] DOB: [birth year] Today's Date: 03/17/2024  History of Present Illness  Pt is a 68 y/o F admitted on 03/15/24 after presenting with c/o SOB & increased sputum production. Pt is being treated for acute on chronic hypercapnic respiratory failure, chronic hypoxic respiratory failure, & COPD exacerbation. PMH: HFpEF, COPD, chronic hypoxic RF on 3-4L O2 & CPAP, CAD/CABG, HTN, HLD, DM2, tobacco use disorder  Clinical Impression  Pt seen for PT evaluation with pt agreeable, sister present in room. Pt reports prior to admission she was ambulatory without AD. PT provided pt with rollator to trial with PT educating her on ability to take seated rest breaks PRN. PT educated pt on rollator brake management with good return demo from pt. Pt is able to ambulate into hallway & back with 1 seated rest break. Pt c/o feeling winded but SPO2 93% on 3L/min.  Recommend rollator use at d/c with pt agreeable.       If plan is discharge home, recommend the following: Assistance with cooking/housework;Assist for transportation;Help with stairs or ramp for entrance   Can travel by private vehicle        Equipment Recommendations Rollator (4 wheels)  Recommendations for Other Services       Functional Status Assessment Patient has had a recent decline in their functional status and demonstrates the ability to make significant improvements in function in a reasonable and predictable amount of time.     Precautions / Restrictions Precautions Precautions: Fall Precaution/Restrictions Comments: watch O2 Restrictions Weight Bearing Restrictions Per Provider Order: No      Mobility  Bed Mobility               General bed mobility comments: not tested, pt received & left sitting in recliner    Transfers Overall transfer level: Needs assistance Equipment used: Rollator (4 wheels) Transfers: Sit to/from Stand Sit to Stand:  Supervision           General transfer comment: cuing re: rollator brake management    Ambulation/Gait Ambulation/Gait assistance: Supervision Gait Distance (Feet): 50 Feet Assistive device: Rollator (4 wheels) Gait Pattern/deviations: Step-through pattern, Decreased step length - right, Decreased stride length, Decreased step length - left Gait velocity: decreased     General Gait Details: 1 seated rest break  Stairs            Wheelchair Mobility     Tilt Bed    Modified Rankin (Stroke Patients Only)       Balance Overall balance assessment: Mild deficits observed, not formally tested                                           Pertinent Vitals/Pain Pain Assessment Pain Assessment: No/denies pain    Home Living Family/patient expects to be discharged to:: Private residence Living Arrangements: Other relatives (sister) Available Help at Discharge: Family;Available 24 hours/day Type of Home: House Home Access: Level entry       Home Layout: One level Home Equipment: BSC/3in1;Shower seat;Wheelchair - manual Additional Comments: 3L O2 at baseline    Prior Function Prior Level of Function : Independent/Modified Independent;Driving             Mobility Comments: rarely uses w/c, used for her grandon's high school graduation, typically no AD ADLs Comments: stands for showers, ind with ADL, ind with med mgmt  and cooking     Extremity/Trunk Assessment   Upper Extremity Assessment Upper Extremity Assessment: Overall WFL for tasks assessed    Lower Extremity Assessment Lower Extremity Assessment: Overall WFL for tasks assessed    Cervical / Trunk Assessment Cervical / Trunk Assessment: Normal  Communication   Communication Communication: No apparent difficulties    Cognition Arousal: Alert Behavior During Therapy: WFL for tasks assessed/performed, Anxious   PT - Cognitive impairments: No apparent impairments                        PT - Cognition Comments: does not appear to have a good understanding of COPD diagnosis Following commands: Intact       Cueing Cueing Techniques: Verbal cues     General Comments General comments (skin integrity, edema, etc.): Pt received on 4L/min, on 3L/min during gait & left on 3L/min at end of session, SPO2 93%    Exercises     Assessment/Plan    PT Assessment Patient needs continued PT services  PT Problem List Cardiopulmonary status limiting activity;Decreased mobility;Decreased activity tolerance;Decreased knowledge of use of DME       PT Treatment Interventions DME instruction;Balance training;Gait training;Neuromuscular re-education;Stair training;Therapeutic exercise;Functional mobility training;Therapeutic activities;Patient/family education    PT Goals (Current goals can be found in the Care Plan section)  Acute Rehab PT Goals Patient Stated Goal: get better, breathe better PT Goal Formulation: With patient/family Time For Goal Achievement: 03/31/24 Potential to Achieve Goals: Good    Frequency Min 2X/week     Co-evaluation               AM-PAC PT "6 Clicks" Mobility  Outcome Measure Help needed turning from your back to your side while in a flat bed without using bedrails?: None Help needed moving from lying on your back to sitting on the side of a flat bed without using bedrails?: None Help needed moving to and from a bed to a chair (including a wheelchair)?: None Help needed standing up from a chair using your arms (e.g., wheelchair or bedside chair)?: A Little Help needed to walk in hospital room?: A Little Help needed climbing 3-5 steps with a railing? : A Little 6 Click Score: 21    End of Session Equipment Utilized During Treatment: Oxygen  Activity Tolerance: Patient limited by fatigue (limited by SOB) Patient left: in chair;with family/visitor present   PT Visit Diagnosis: Muscle weakness (generalized) (M62.81)     Time: 1610-9604 PT Time Calculation (min) (ACUTE ONLY): 13 min   Charges:   PT Evaluation $PT Eval Low Complexity: 1 Low   PT General Charges $$ ACUTE PT VISIT: 1 Visit         Emaline Handsome, PT, DPT 03/17/24, 11:04 AM   Venetta Gill 03/17/2024, 11:03 AM

## 2024-03-17 NOTE — Plan of Care (Signed)
 Pt O2 saturations continue to be in normal limits

## 2024-03-17 NOTE — Plan of Care (Signed)
 Problem: Education: Goal: Knowledge of General Education information will improve Description: Including pain rating scale, medication(s)/side effects and non-pharmacologic comfort measures 03/17/2024 0936 by Rema Care, RN Outcome: Completed/Met 03/17/2024 0751 by Rema Care, RN Outcome: Progressing   Problem: Health Behavior/Discharge Planning: Goal: Ability to manage health-related needs will improve 03/17/2024 0936 by Rema Care, RN Outcome: Completed/Met 03/17/2024 0751 by Rema Care, RN Outcome: Progressing   Problem: Clinical Measurements: Goal: Ability to maintain clinical measurements within normal limits will improve 03/17/2024 0936 by Rema Care, RN Outcome: Completed/Met 03/17/2024 0751 by Rema Care, RN Outcome: Progressing Goal: Will remain free from infection 03/17/2024 0936 by Rema Care, RN Outcome: Completed/Met 03/17/2024 0751 by Rema Care, RN Outcome: Progressing Goal: Diagnostic test results will improve 03/17/2024 0936 by Rema Care, RN Outcome: Completed/Met 03/17/2024 0751 by Rema Care, RN Outcome: Progressing Goal: Respiratory complications will improve 03/17/2024 0936 by Rema Care, RN Outcome: Completed/Met 03/17/2024 0751 by Rema Care, RN Outcome: Progressing Goal: Cardiovascular complication will be avoided 03/17/2024 0936 by Rema Care, RN Outcome: Completed/Met 03/17/2024 0751 by Rema Care, RN Outcome: Progressing   Problem: Activity: Goal: Risk for activity intolerance will decrease 03/17/2024 0936 by Rema Care, RN Outcome: Completed/Met 03/17/2024 0751 by Rema Care, RN Outcome: Progressing   Problem: Nutrition: Goal: Adequate nutrition will be maintained 03/17/2024 0936 by Rema Care, RN Outcome: Completed/Met 03/17/2024 0751 by Rema Care, RN Outcome: Progressing   Problem: Coping: Goal: Level of anxiety will decrease 03/17/2024 0936 by Rema Care, RN Outcome: Completed/Met 03/17/2024 0751 by Rema Care, RN Outcome: Progressing   Problem: Elimination: Goal: Will not experience complications related to bowel motility 03/17/2024 0936 by Rema Care, RN Outcome: Completed/Met 03/17/2024 0751 by Rema Care, RN Outcome: Progressing Goal: Will not experience complications related to urinary retention 03/17/2024 0936 by Rema Care, RN Outcome: Completed/Met 03/17/2024 0751 by Rema Care, RN Outcome: Progressing   Problem: Pain Managment: Goal: General experience of comfort will improve and/or be controlled 03/17/2024 0936 by Rema Care, RN Outcome: Completed/Met 03/17/2024 0751 by Rema Care, RN Outcome: Progressing   Problem: Safety: Goal: Ability to remain free from injury will improve 03/17/2024 0936 by Rema Care, RN Outcome: Completed/Met 03/17/2024 0751 by Rema Care, RN Outcome: Progressing   Problem: Skin Integrity: Goal: Risk for impaired skin integrity will decrease 03/17/2024 0936 by Rema Care, RN Outcome: Completed/Met 03/17/2024 0751 by Rema Care, RN Outcome: Progressing   Problem: Education: Goal: Ability to describe self-care measures that may prevent or decrease complications (Diabetes Survival Skills Education) will improve 03/17/2024 0936 by Rema Care, RN Outcome: Completed/Met 03/17/2024 0751 by Rema Care, RN Outcome: Progressing Goal: Individualized Educational Video(s) 03/17/2024 0936 by Rema Care, RN Outcome: Completed/Met 03/17/2024 0751 by Rema Care, RN Outcome: Progressing   Problem: Coping: Goal: Ability to adjust to condition or change in health will improve 03/17/2024 0936 by Rema Care, RN Outcome: Completed/Met 03/17/2024 0751 by Rema Care, RN Outcome: Progressing   Problem: Fluid Volume: Goal: Ability to maintain a balanced intake and output will improve 03/17/2024 0936 by Rema Care,  RN Outcome: Completed/Met 03/17/2024 0751 by Rema Care, RN Outcome: Progressing   Problem: Health Behavior/Discharge Planning: Goal: Ability to identify and utilize available resources and services will improve 03/17/2024 0936 by Rema Care, RN Outcome: Completed/Met 03/17/2024 0751 by  Rema Care, RN Outcome: Progressing Goal: Ability to manage health-related needs will improve 03/17/2024 0936 by Rema Care, RN Outcome: Completed/Met 03/17/2024 0751 by Rema Care, RN Outcome: Progressing   Problem: Metabolic: Goal: Ability to maintain appropriate glucose levels will improve 03/17/2024 0936 by Rema Care, RN Outcome: Completed/Met 03/17/2024 0751 by Rema Care, RN Outcome: Progressing   Problem: Nutritional: Goal: Maintenance of adequate nutrition will improve 03/17/2024 0936 by Rema Care, RN Outcome: Completed/Met 03/17/2024 0751 by Rema Care, RN Outcome: Progressing Goal: Progress toward achieving an optimal weight will improve 03/17/2024 0936 by Rema Care, RN Outcome: Completed/Met 03/17/2024 0751 by Rema Care, RN Outcome: Progressing   Problem: Skin Integrity: Goal: Risk for impaired skin integrity will decrease 03/17/2024 0936 by Rema Care, RN Outcome: Completed/Met 03/17/2024 0751 by Rema Care, RN Outcome: Progressing   Problem: Tissue Perfusion: Goal: Adequacy of tissue perfusion will improve 03/17/2024 0936 by Rema Care, RN Outcome: Completed/Met 03/17/2024 0751 by Rema Care, RN Outcome: Progressing   Problem: Acute Rehab OT Goals (only OT should resolve) Goal: Pt. Will Perform Lower Body Dressing Outcome: Completed/Met Goal: Pt. Will Transfer To Toilet Outcome: Completed/Met Goal: Pt. Will Perform Tub/Shower Transfer Outcome: Completed/Met Goal: OT Additional ADL Goal #1 Outcome: Completed/Met

## 2024-03-17 NOTE — Plan of Care (Signed)

## 2024-03-17 NOTE — TOC Transition Note (Addendum)
 Transition of Care [facility]) - Discharge Note   Patient Details  Name: [name] MRN: [id number] Date of Birth: 09-27-1956  Transition of Care [facility] CM/SW Contact:  Jennett Model, RN Phone Number: 03/17/2024, 9:46 AM   Clinical Narrative:    For dc today, her sister is at the bedside, NCM offered choice for HHPT, HHOT, she staates she has no preference.  NCM made referral to [facility] , she is able to take referral.  Soc will begin 24 to 48 hrs post dc.  NCM was notified that patient would like a rollator,  NCM spoke with patient she would like this to come from [employer],  NCM made referral to [facility] with [employer], this rollator will be sent to patient 's house per the patient request.         Patient Goals and CMS Choice            Discharge Placement                       Discharge Plan and Services Additional resources added to the After Visit Summary for                                       Social Drivers of Health (SDOH) Interventions SDOH Screenings   Food Insecurity: No Food Insecurity (03/15/2024)  Housing: Low Risk  (03/15/2024)  Transportation Needs: No Transportation Needs (03/15/2024)  Utilities: Not At Risk (03/15/2024)  Alcohol Screen: Low Risk  (03/10/2024)  Depression (PHQ2-9): Low Risk  (03/11/2024)  Financial Resource Strain: Low Risk  (03/10/2024)  Physical Activity: Sufficiently Active (03/10/2024)  Social Connections: Moderately Integrated (03/15/2024)  Stress: No Stress Concern Present (03/10/2024)  Tobacco Use: Medium Risk (03/15/2024)  Health Literacy: Adequate Health Literacy (06/16/2023)     Readmission Risk Interventions    03/16/2024   12:56 PM  Readmission Risk Prevention Plan  Transportation Screening Complete  Home Care Screening Complete  Medication Review (RN CM) Complete

## 2024-03-17 NOTE — Discharge Summary (Signed)
 Physician Discharge Summary  [name] VWU:[id number] DOB: 1956/05/08 DOA: 03/15/2024  PCP: Rhonda Coombe, MD  Admit date: 03/15/2024 Discharge date: 03/17/24  Admitted From: Home Disposition: Home Recommendations for Outpatient Follow-up:  Follow up with PCP and pulmonology in 1 to 2 weeks Consider goals of care discussion and palliative referral. Advised to use minimal oxygen  to keep his saturation above 88% due to risk of CO2 retention Counseled on the importance of smoking cessation Please follow up on the following pending results: None  Home Health: [facility] PT/OT Equipment/Devices: Rollator.  Patient has home oxygen  and CPAP  Discharge Condition: Stable CODE STATUS: Full code  Follow-up Information     Rhonda Coombe, MD Follow up.   Specialties: Internal Medicine, Radiology Why: Pt has appointment on 03/23/24 @ 10:05 with Rosalia Colonel at [facility]. Contact information: [address] [place] [address] [phone]         [facility] Follow up.   Why: Agency will call you to set up apt time Contact information: [address] [phone]        [facility]  Follow up.   Why: rollator to deliver to home Contact information: [address] [place] [address] [phone]                 Hospital course 68 year old M with PMH of HFpEF, COPD, chronic hypoxic RF on 3 to 4 L and CPAP, CAD/CABG, HTN, HLD, DM-2 and tobacco use disorder presenting with shortness of breath and increased sputum production, and admitted with acute on chronic respiratory failure with hypoxia and hypercapnia in the setting of COPD exacerbation.  Recently treated outpatient with Levaquin  and prednisone  for COPD exacerbation.  Reports smoking 3-4 cigarettes a day.  Reports using her Trelegy consistently.   In ED, hypertensive, tachycardic and tachypneic and placed on 4L [place]. Labs notable for VBG 7.23/99/60/41 suggesting acute on chronic  hypercapnic respiratory failure. BNP 53, and WBC 12.7.  2 view CXR without acute finding.  Pt administered IV dexamethasone  10mg  x1, placed on BiPAP and admitted to progressive care unit.  Patient continued on BiPAP, steroid, antibiotics and nebulizers.  Acute acidosis resolved.  On the day of discharge, patient felt better.  Ambulated on home 3 L and maintain saturation above 91% although she was noted to be very anxious about her oxygen  level.  She has been extensively counseled on the importance of using minimum oxygen  to keep a saturation above 88% due to risk of CO2 retention.  Also encouraged to use his CPAP when she is asleep or napping.  She has been reminded about the importance of smoking cessation as well.  She is discharged on prednisone  taper.  She is already on Levaquin  and advised to complete course.  She is already on Trelegy and as needed DuoNeb.  Advised to follow-up with pulmonology.  See individual problem list below for more.   Problems addressed during this hospitalization Acute on chronic hypercapnic respiratory failure: VBG 7.23/99/60/41.5 suggesting acute on chronic.  CO2 improved on acute hypercapnia resolved.  Chronic hypoxic respiratory failure-uses 3 to 4 L at baseline. COPD exacerbation- SOB and increased secretion on presentation -Management as above   Chronic HFpEF: Appears euvolemic on exam. - Continue home Lasix  and bisoprolol    History of CAD/CABG: No anginal symptoms. - Continue home meds-bisoprolol , Imdur , Lipitor   NIDDM-2 with hyperglycemia: A1c 7.4% on 5/22.  Hyperglycemia likely due to steroid.  On low-dose metformin  at home. -  Continue home meds   Essential hypertension - Continue home amlodipine , bisoprolol  and Imdur    Sinus tachycardia: Resolved. - Continue bisoprolol    Hyperkalemia: Resolved.  K down to 5.1 - P.o. Lokelma 10 g x 1 before discharge   Tobacco use disorder: Reports smoking 3 to 4 cigarettes a day - Encourage cessation    HLD - Continue statin   Anxiety: Appears very anxious contributing to her breathing.  Denies recent alcohol in weeks.   Class I obesity Body mass index is 34.53 kg/m.          Time spent 35 minutes  Vital signs Vitals:   03/17/24 0034 03/17/24 0308 03/17/24 0440 03/17/24 0715  BP: 131/65  (!) 142/73 (!) 158/68  Pulse: 85  85 92  Temp: 97.9 F (36.6 C)  97.9 F (36.6 C) 97.9 F (36.6 C)  Resp: (!) 23  15 (!) 24  Height:      Weight:   82.9 kg   SpO2: 100% 100% 96% 95%  TempSrc: Axillary  Axillary Oral  BMI (Calculated):   34.55      Discharge exam  GENERAL: No apparent distress.  Nontoxic. HEENT: MMM.  Vision and hearing grossly intact.  NECK: Supple.  No apparent JVD.  RESP:  No IWOB.  Fair aeration bilaterally. CVS:  RRR. Heart sounds normal.  ABD/GI/GU: BS+. Abd soft, NTND.  MSK/EXT:  Moves extremities. No apparent deformity. No edema.  SKIN: no apparent skin lesion or wound NEURO: Awake and alert. Oriented appropriately.  No apparent focal neuro deficit. PSYCH: Appears anxious.  Discharge Instructions Discharge Instructions     Diet - low sodium heart healthy   Complete by: As directed    Discharge instructions   Complete by: As directed    It has been a pleasure taking care of you!  You were hospitalized due to COPD exacerbation for which you have been treated with steroid, antibiotics and breathing treatments.  We are discharging you on more steroid to calm inflammation.  Finish Levaquin  as previously planned.  Is very important that you use minimum oxygen  to keep your oxygen  level above 88%.  Too much oxygen  is dangerous in people with COPD.   It is important that you quit smoking cigarettes.  You may use nicotine  patch to help you quit smoking.  Nicotine  patch is available over-the-counter.  You may also discuss other options to help you quit smoking with your primary care doctor. You can also talk to professional counselors at 1-800-QUIT-NOW  [phone]) for free smoking cessation counseling.     Take care,   Increase activity slowly   Complete by: As directed       Allergies as of 03/17/2024       Reactions   Chantix  [varenicline ] Nausea And Vomiting   Augmentin  [amoxicillin -pot Clavulanate] Nausea And Vomiting        Medication List     TAKE these medications    acetaminophen  500 MG tablet Commonly known as: TYLENOL  Take 1,000 mg by mouth 2 (two) times daily as needed for headache, fever, moderate pain or mild pain.   albuterol  108 (90 Base) MCG/ACT inhaler Commonly known as: VENTOLIN  HFA INHALE 1-2 PUFFS BY MOUTH EVERY 6 HOURS AS NEEDED FOR WHEEZE OR SHORTNESS OF BREATH What changed: Another medication with the same name was removed. Continue taking this medication, and follow the directions you see here.   alendronate  70 MG tablet Commonly known as: FOSAMAX  Take 70 mg by mouth once a week.  amLODipine  5 MG tablet Commonly known as: NORVASC  TAKE 1 TABLET (5 MG TOTAL) BY MOUTH DAILY.   atorvastatin  40 MG tablet Commonly known as: LIPITOR Take 1 tablet (40 mg total) by mouth daily.   bisoprolol  5 MG tablet Commonly known as: ZEBETA  TAKE 1 TABLET (5 MG TOTAL) BY MOUTH DAILY.   Dupixent  300 MG/2ML Soaj Generic drug: Dupilumab  Inject 300 mg into the skin every 14 (fourteen) days.   fluticasone  50 MCG/ACT nasal spray Commonly known as: FLONASE  Place 1 spray into both nostrils daily.   furosemide  20 MG tablet Commonly known as: LASIX  Take 20 mg by mouth 3 (three) times a week.   ipratropium-albuterol  0.5-2.5 (3) MG/3ML Soln Commonly known as: DUONEB INHALE 3 ML BY NEBULIZER EVERY 6 HOURS AS NEEDED   isosorbide  mononitrate 60 MG 24 hr tablet Commonly known as: IMDUR  TAKE 1.5 TABLETS (90 MG TOTAL) BY MOUTH DAILY.   Klor-Con  M10 10 MEQ tablet Generic drug: potassium chloride  TAKE 1 TABLET BY MOUTH EVERY DAY   levofloxacin  500 MG tablet Commonly known as: LEVAQUIN  Take 1 tablet (500  mg total) by mouth daily.   MAGNESIUM  OXIDE PO Take 1 tablet by mouth at bedtime.   metFORMIN  500 MG 24 hr tablet Commonly known as: GLUCOPHAGE -XR TAKE 2 TABLET BY MOUTH EVERY DAY WITH BREAKFAST   Multivitamin Women 50+ Tabs Take 1 tablet by mouth in the morning.   predniSONE  10 MG tablet Commonly known as: DELTASONE  3 tabs by mouth per day for 3 days,2tabs per day for 3 days,1tab per day for 3 days   Trelegy Ellipta  200-62.5-25 MCG/ACT Aepb Generic drug: Fluticasone -Umeclidin-Vilant INHALE 1 PUFF BY MOUTH EVERY DAY   Trulicity  3 MG/0.5ML Soaj Generic drug: Dulaglutide  Inject 3 mg as directed once a week. What changed: how much to take   VITAMIN B-12 PO Take 1 tablet by mouth in the morning.   VITAMIN D -3 PO Take 1 capsule by mouth in the morning.               Durable Medical Equipment  (From admission, onward)           Start     Ordered   03/17/24 1054  For home use only DME 4 wheeled rolling walker with seat  Once       Question:  Patient needs a walker to treat with the following condition  Answer:  Weakness   03/17/24 1054            Consultations: None  Procedures/Studies:   DG Chest 2 View Result Date: 03/15/2024 CLINICAL DATA:  Shortness of breath and dyspnea. EXAM: CHEST - 2 VIEW COMPARISON:  December 09, 2023 FINDINGS: Multiple sternal wires and vascular clips are seen. The heart size and mediastinal contours are within normal limits. Stable, diffuse, chronic appearing increased lung markings are seen. There is no evidence of an acute infiltrate, pleural effusion or pneumothorax. Multilevel degenerative changes are noted throughout the thoracic spine. IMPRESSION: 1. Evidence of prior median sternotomy/CABG. 2. Chronic appearing increased lung markings without evidence of acute or active cardiopulmonary disease. Electronically Signed   By: Virgle Grime M.D.   On: 03/15/2024 05:06       The results of significant diagnostics from  this hospitalization (including imaging, microbiology, ancillary and laboratory) are listed below for reference.     Microbiology: Recent Results (from the past 240 hours)  Resp panel by RT-PCR (RSV, Flu A&B, Covid) Anterior Nasal Swab     Status: None  Collection Time: 03/15/24  4:59 AM   Specimen: Anterior Nasal Swab  Result Value Ref Range Status   SARS Coronavirus 2 by RT PCR NEGATIVE NEGATIVE Final   Influenza A by PCR NEGATIVE NEGATIVE Final   Influenza B by PCR NEGATIVE NEGATIVE Final    Comment: (NOTE) The Xpert Xpress SARS-CoV-2/FLU/RSV plus assay is intended as an aid in the diagnosis of influenza from Nasopharyngeal swab specimens and should not be used as a sole basis for treatment. Nasal washings and aspirates are unacceptable for Xpert Xpress SARS-CoV-2/FLU/RSV testing.  Fact Sheet for Patients: BloggerCourse.com  Fact Sheet for Healthcare Providers: SeriousBroker.it  This test is not yet approved or cleared by the [place]  FDA and has been authorized for detection and/or diagnosis of SARS-CoV-2 by FDA under an Emergency Use Authorization (EUA). This EUA will remain in effect (meaning this test can be used) for the duration of the COVID-19 declaration under Section 564(b)(1) of the Act, 21 U.S.C. section 360bbb-3(b)(1), unless the authorization is terminated or revoked.     Resp Syncytial Virus by PCR NEGATIVE NEGATIVE Final    Comment: (NOTE) Fact Sheet for Patients: BloggerCourse.com  Fact Sheet for Healthcare Providers: SeriousBroker.it  This test is not yet approved or cleared by the [place]  FDA and has been authorized for detection and/or diagnosis of SARS-CoV-2 by FDA under an Emergency Use Authorization (EUA). This EUA will remain in effect (meaning this test can be used) for the duration of the COVID-19 declaration under Section 564(b)(1) of  the Act, 21 U.S.C. section 360bbb-3(b)(1), unless the authorization is terminated or revoked.  Performed at [facility], [address] [place], [place] [address]   Respiratory (~20 pathogens) panel by PCR     Status: None   Collection Time: 03/15/24  4:59 AM   Specimen: Nasopharyngeal Swab; Respiratory  Result Value Ref Range Status   Adenovirus NOT DETECTED NOT DETECTED Final   Coronavirus 229E NOT DETECTED NOT DETECTED Final    Comment: (NOTE) The Coronavirus on the Respiratory Panel, DOES NOT test for the novel  Coronavirus (2019 nCoV)    Coronavirus HKU1 NOT DETECTED NOT DETECTED Final   Coronavirus NL63 NOT DETECTED NOT DETECTED Final   Coronavirus OC43 NOT DETECTED NOT DETECTED Final   Metapneumovirus NOT DETECTED NOT DETECTED Final   Rhinovirus / Enterovirus NOT DETECTED NOT DETECTED Final   Influenza A NOT DETECTED NOT DETECTED Final   Influenza B NOT DETECTED NOT DETECTED Final   Parainfluenza Virus 1 NOT DETECTED NOT DETECTED Final   Parainfluenza Virus 2 NOT DETECTED NOT DETECTED Final   Parainfluenza Virus 3 NOT DETECTED NOT DETECTED Final   Parainfluenza Virus 4 NOT DETECTED NOT DETECTED Final   Respiratory Syncytial Virus NOT DETECTED NOT DETECTED Final   Bordetella pertussis NOT DETECTED NOT DETECTED Final   Bordetella Parapertussis NOT DETECTED NOT DETECTED Final   Chlamydophila pneumoniae NOT DETECTED NOT DETECTED Final   Mycoplasma pneumoniae NOT DETECTED NOT DETECTED Final    Comment: Performed at [facility], [address] [place], [place] [address]     Labs:  CBC: Recent Labs  Lab 03/11/24 1031 03/15/24 0419 03/15/24 1047 03/16/24 0729 03/17/24 0231  WBC 9.8 12.7* 9.8 7.4 9.7  NEUTROABS 8.0*  --   --   --   --   HGB 13.3 14.2 14.1 14.0 14.1  HCT 41.5 47.2* 47.7* 46.0 47.1*  MCV 86.0 91.1 93.3 92.4 92.0  PLT 207.0 202 175 172 177   BMP &  GFR Recent Labs  Lab 03/11/24 1031 03/15/24 0419 03/15/24 1047 03/16/24 0729  03/17/24 0231  NA 142 136  --  138 141  K 4.3 4.1  --  5.2* 5.1  CL 99 93*  --  89* 92*  CO2 39* 35*  --  38* 36*  GLUCOSE 194* 244*  --  174* 185*  BUN 10 14  --  24* 26*  CREATININE 0.58 0.68 0.62 0.59 0.62  CALCIUM  9.5 9.0  --  9.5 9.5  MG  --   --   --  2.5* 2.5*  PHOS  --   --   --  4.3 4.4   Estimated Creatinine Clearance: 65.7 mL/min (by C-G formula based on SCr of 0.62 mg/dL). Liver & Pancreas: Recent Labs  Lab 03/11/24 1031 03/16/24 0729 03/17/24 0231  AST 16  --   --   ALT 25  --   --   ALKPHOS 89  --   --   BILITOT 0.3  --   --   PROT 6.8  --   --   ALBUMIN 4.4 3.6 3.7   No results for input(s): "LIPASE", "AMYLASE" in the last 168 hours. No results for input(s): "AMMONIA" in the last 168 hours. Diabetic: No results for input(s): "HGBA1C" in the last 72 hours. Recent Labs  Lab 03/16/24 1425 03/16/24 1747 03/16/24 2220 03/17/24 0624 03/17/24 0839  GLUCAP 217* 257* 164* 176* 237*   Cardiac Enzymes: No results for input(s): "CKTOTAL", "CKMB", "CKMBINDEX", "TROPONINI" in the last 168 hours. No results for input(s): "PROBNP" in the last 8760 hours. Coagulation Profile: No results for input(s): "INR", "PROTIME" in the last 168 hours. Thyroid  Function Tests: No results for input(s): "TSH", "T4TOTAL", "FREET4", "T3FREE", "THYROIDAB" in the last 72 hours. Lipid Profile: No results for input(s): "CHOL", "HDL", "LDLCALC", "TRIG", "CHOLHDL", "LDLDIRECT" in the last 72 hours. Anemia Panel: No results for input(s): "VITAMINB12", "FOLATE", "FERRITIN", "TIBC", "IRON", "RETICCTPCT" in the last 72 hours. Urine analysis:    Component Value Date/Time   COLORURINE YELLOW 03/11/2024 1031   APPEARANCEUR CLEAR 03/11/2024 1031   LABSPEC 1.015 03/11/2024 1031   PHURINE 6.0 03/11/2024 1031   GLUCOSEU NEGATIVE 03/11/2024 1031   HGBUR NEGATIVE 03/11/2024 1031   BILIRUBINUR NEGATIVE 03/11/2024 1031   BILIRUBINUR negative 02/24/2023 0924   KETONESUR NEGATIVE 03/11/2024 1031    PROTEINUR 30 (A) 02/27/2023 0620   UROBILINOGEN 0.2 03/11/2024 1031   NITRITE NEGATIVE 03/11/2024 1031   LEUKOCYTESUR NEGATIVE 03/11/2024 1031   Sepsis Labs: Invalid input(s): "PROCALCITONIN", "LACTICIDVEN"   SIGNED:  Theadore Finger, MD  [facility] 03/17/2024, 2:42 PM

## 2024-03-17 NOTE — Progress Notes (Signed)
 Discharge instructions reviewed with pt and her sister.  Copy of instructions given to pt. Pt/sister informed a script for prednisone  has been sent to her pharmacy for pick up. Both verbalized understanding of instructions. Pt's sister did bring her home O2 tank in so pt can have her O2 on, on her way home.   PT is in with pt at this time and having pt to walk with a rollator walker, PT to ask CM about possibly getting this type of walker for use at home to allow rest breaks sitting when pt is up and moving about. PT will ask if the rollator can be sent to the pt's home address.   Once done with PT and CM, pt will be d/c'd via wheelchair with belongings and will be escorted by staff.   Fleta Human, RN SWOT

## 2024-03-17 NOTE — Evaluation (Signed)
 Occupational Therapy Evaluation Patient Details Name: [name] MRN: [id number] DOB: 1956-03-07 Today's Date: 03/17/2024   History of Present Illness   68 y/o F presenting to ED on 5/26 with SOB x3 days, on chronic 3L at baseline, adm with acute on chronic resp failure in setting of COPD exacerbation. CXR without acute finding. PMH includes HFpEF, HTN, HLD, COPD,     Clinical Impressions Pt ind at baseline with ADLs and mobility, though reports she recently purchased a w/c to attend her grandson's graduation. Pt lives with her sister who can assist at d/c. Pt currently needing up to min A for ADLs, and supervision for transfers without AD. Pt managing O2 tank with mobility, needs x2 standing rest breaks for ~3 mins each. SpO2 down to 91% at lowest on baseline 3L O2, pt anxious with O2 level throughout session and SOB/DOE. Left on 4L O2 at end of session per pt request, RN notified. Discussed energy conservation strategies for home. Pt presenting with impairments listed below, will follow acutely. Recommend HHOT at d/c.     If plan is discharge home, recommend the following:   A little help with walking and/or transfers;A little help with bathing/dressing/bathroom;Assistance with cooking/housework;Assist for transportation;Help with stairs or ramp for entrance     Functional Status Assessment   Patient has had a recent decline in their functional status and demonstrates the ability to make significant improvements in function in a reasonable and predictable amount of time.     Equipment Recommendations   None recommended by OT (pt has all needed DME)     Recommendations for Other Services   PT consult     Precautions/Restrictions   Precautions Precautions: Fall Precaution/Restrictions Comments: watch O2 Restrictions Weight Bearing Restrictions Per Provider Order: No     Mobility Bed Mobility               General bed mobility comments: OOB in chair upon  arrival and departure    Transfers Overall transfer level: Needs assistance Equipment used: None Transfers: Sit to/from Stand Sit to Stand: Supervision                  Balance Overall balance assessment: Mild deficits observed, not formally tested                                         ADL either performed or assessed with clinical judgement   ADL Overall ADL's : Needs assistance/impaired Eating/Feeding: Set up;Sitting   Grooming: Set up;Standing   Upper Body Bathing: Minimal assistance;Sitting;Standing   Lower Body Bathing: Minimal assistance;Sit to/from stand;Sitting/lateral leans   Upper Body Dressing : Contact guard assist;Sitting;Standing   Lower Body Dressing: Contact guard assist;Sit to/from stand;Sitting/lateral leans Lower Body Dressing Details (indicate cue type and reason): donning socks figure 4 Toilet Transfer: Ambulation;Supervision/safety   Toileting- Clothing Manipulation and Hygiene: Supervision/safety       Functional mobility during ADLs: Supervision/safety       Vision   Vision Assessment?: No apparent visual deficits     Perception Perception: Not tested       Praxis Praxis: Not tested       Pertinent Vitals/Pain Pain Assessment Pain Assessment: No/denies pain     Extremity/Trunk Assessment Upper Extremity Assessment Upper Extremity Assessment: Overall WFL for tasks assessed   Lower Extremity Assessment Lower Extremity Assessment: Defer to PT evaluation   Cervical / Trunk  Assessment Cervical / Trunk Assessment: Normal   Communication Communication Communication: No apparent difficulties   Cognition Arousal: Alert Behavior During Therapy: WFL for tasks assessed/performed, Anxious Cognition: No apparent impairments             OT - Cognition Comments: anxious regarding O2, requests to be incr to 4L O2 even though she is satting 90s on 3L (baseline)                 Following commands:  Intact       Cueing  General Comments   Cueing Techniques: Verbal cues  SpO2 down to 91% at lowest on baseline 3L O2   Exercises     Shoulder Instructions      Home Living Family/patient expects to be discharged to:: Private residence Living Arrangements: Other relatives (sister) Available Help at Discharge: Family;Available 24 hours/day Type of Home: House Home Access: Level entry     Home Layout: One level     Bathroom Shower/Tub: Producer, television/film/video: Standard Bathroom Accessibility: Yes   Home Equipment: Agricultural consultant (2 wheels);BSC/3in1;Shower seat;Wheelchair - manual   Additional Comments: 3L O2 at baseline      Prior Functioning/Environment Prior Level of Function : Independent/Modified Independent;Driving             Mobility Comments: rarely uses w/c, used for her grandon's high school graduation, typically no AD ADLs Comments: stands for showers, ind with ADL, ind with med mgmt and cooking    OT Problem List: Decreased strength;Decreased range of motion;Decreased activity tolerance;Impaired balance (sitting and/or standing);Cardiopulmonary status limiting activity   OT Treatment/Interventions: Self-care/ADL training;Therapeutic exercise;DME and/or AE instruction;Energy conservation;Therapeutic activities;Patient/family education;Balance training      OT Goals(Current goals can be found in the care plan section)   Acute Rehab OT Goals Patient Stated Goal: none stated OT Goal Formulation: With patient Time For Goal Achievement: 03/31/24 Potential to Achieve Goals: Good ADL Goals Pt Will Perform Lower Body Dressing: Independently;sitting/lateral leans;sit to/from stand Pt Will Transfer to Toilet: Independently;regular height toilet;ambulating Pt Will Perform Tub/Shower Transfer: Shower transfer;Independently;ambulating;shower seat Additional ADL Goal #1: pt will verbalize x3 energy conservation strategies in prep for ADLs   OT  Frequency:  Min 1X/week    Co-evaluation              AM-PAC OT "6 Clicks" Daily Activity     Outcome Measure Help from another person eating meals?: A Little Help from another person taking care of personal grooming?: A Little Help from another person toileting, which includes using toliet, bedpan, or urinal?: A Little Help from another person bathing (including washing, rinsing, drying)?: A Little Help from another person to put on and taking off regular upper body clothing?: A Little Help from another person to put on and taking off regular lower body clothing?: A Little 6 Click Score: 18   End of Session Equipment Utilized During Treatment: Oxygen  Nurse Communication: Mobility status  Activity Tolerance: Patient tolerated treatment well Patient left: with call bell/phone within reach;in chair  OT Visit Diagnosis: Unsteadiness on feet (R26.81);Other abnormalities of gait and mobility (R26.89);Muscle weakness (generalized) (M62.81)                Time: 1610-9604 OT Time Calculation (min): 21 min Charges:  OT General Charges $OT Visit: 1 Visit OT Evaluation $OT Eval Low Complexity: 1 Low  Benedict Brain, OTD, OTR/L SecureChat Preferred Acute Rehab [phone]   Benedict Brain Koonce 03/17/2024, 8:33 AM

## 2024-03-18 ENCOUNTER — Emergency Department (HOSPITAL_COMMUNITY)

## 2024-03-18 ENCOUNTER — Inpatient Hospital Stay (HOSPITAL_COMMUNITY)
Admission: EM | Admit: 2024-03-18 | Discharge: 2024-03-23 | DRG: 291 | Disposition: A | Discharge status: Home Health | Attending: Internal Medicine | Admitting: Internal Medicine

## 2024-03-18 ENCOUNTER — Telehealth: Payer: Self-pay | Admitting: *Deleted

## 2024-03-18 ENCOUNTER — Encounter (HOSPITAL_COMMUNITY): Payer: Self-pay | Admitting: Emergency Medicine

## 2024-03-18 DIAGNOSIS — J811 Chronic pulmonary edema: Secondary | ICD-10-CM | POA: Diagnosis present

## 2024-03-18 DIAGNOSIS — Z951 Presence of aortocoronary bypass graft: Secondary | ICD-10-CM | POA: Diagnosis not present

## 2024-03-18 DIAGNOSIS — T380X5A Adverse effect of glucocorticoids and synthetic analogues, initial encounter: Secondary | ICD-10-CM | POA: Diagnosis not present

## 2024-03-18 DIAGNOSIS — J9610 Chronic respiratory failure, unspecified whether with hypoxia or hypercapnia: Secondary | ICD-10-CM | POA: Diagnosis not present

## 2024-03-18 DIAGNOSIS — J441 Chronic obstructive pulmonary disease with (acute) exacerbation: Secondary | ICD-10-CM | POA: Diagnosis present

## 2024-03-18 DIAGNOSIS — Z66 Do not resuscitate: Secondary | ICD-10-CM | POA: Diagnosis present

## 2024-03-18 DIAGNOSIS — Z515 Encounter for palliative care: Secondary | ICD-10-CM

## 2024-03-18 DIAGNOSIS — J449 Chronic obstructive pulmonary disease, unspecified: Secondary | ICD-10-CM | POA: Diagnosis not present

## 2024-03-18 DIAGNOSIS — R0989 Other specified symptoms and signs involving the circulatory and respiratory systems: Secondary | ICD-10-CM | POA: Diagnosis not present

## 2024-03-18 DIAGNOSIS — Z7985 Long-term (current) use of injectable non-insulin antidiabetic drugs: Secondary | ICD-10-CM

## 2024-03-18 DIAGNOSIS — E662 Morbid (severe) obesity with alveolar hypoventilation: Secondary | ICD-10-CM | POA: Diagnosis not present

## 2024-03-18 DIAGNOSIS — I509 Heart failure, unspecified: Secondary | ICD-10-CM | POA: Diagnosis not present

## 2024-03-18 DIAGNOSIS — I110 Hypertensive heart disease with heart failure: Principal | ICD-10-CM | POA: Diagnosis present

## 2024-03-18 DIAGNOSIS — R0600 Dyspnea, unspecified: Principal | ICD-10-CM

## 2024-03-18 DIAGNOSIS — Z7984 Long term (current) use of oral hypoglycemic drugs: Secondary | ICD-10-CM | POA: Diagnosis not present

## 2024-03-18 DIAGNOSIS — I2510 Atherosclerotic heart disease of native coronary artery without angina pectoris: Secondary | ICD-10-CM | POA: Diagnosis present

## 2024-03-18 DIAGNOSIS — E785 Hyperlipidemia, unspecified: Secondary | ICD-10-CM | POA: Diagnosis present

## 2024-03-18 DIAGNOSIS — J9622 Acute and chronic respiratory failure with hypercapnia: Secondary | ICD-10-CM | POA: Diagnosis present

## 2024-03-18 DIAGNOSIS — R7989 Other specified abnormal findings of blood chemistry: Secondary | ICD-10-CM | POA: Diagnosis not present

## 2024-03-18 DIAGNOSIS — I517 Cardiomegaly: Secondary | ICD-10-CM | POA: Diagnosis not present

## 2024-03-18 DIAGNOSIS — Z6833 Body mass index (BMI) 33.0-33.9, adult: Secondary | ICD-10-CM

## 2024-03-18 DIAGNOSIS — Z7189 Other specified counseling: Secondary | ICD-10-CM | POA: Diagnosis not present

## 2024-03-18 DIAGNOSIS — I2781 Cor pulmonale (chronic): Secondary | ICD-10-CM | POA: Diagnosis present

## 2024-03-18 DIAGNOSIS — J9621 Acute and chronic respiratory failure with hypoxia: Secondary | ICD-10-CM | POA: Diagnosis not present

## 2024-03-18 DIAGNOSIS — Z1152 Encounter for screening for COVID-19: Secondary | ICD-10-CM

## 2024-03-18 DIAGNOSIS — F17200 Nicotine dependence, unspecified, uncomplicated: Secondary | ICD-10-CM | POA: Diagnosis not present

## 2024-03-18 DIAGNOSIS — Z7952 Long term (current) use of systemic steroids: Secondary | ICD-10-CM

## 2024-03-18 DIAGNOSIS — R0602 Shortness of breath: Secondary | ICD-10-CM | POA: Diagnosis not present

## 2024-03-18 DIAGNOSIS — Z823 Family history of stroke: Secondary | ICD-10-CM

## 2024-03-18 DIAGNOSIS — F419 Anxiety disorder, unspecified: Secondary | ICD-10-CM | POA: Diagnosis present

## 2024-03-18 DIAGNOSIS — Z8249 Family history of ischemic heart disease and other diseases of the circulatory system: Secondary | ICD-10-CM | POA: Diagnosis not present

## 2024-03-18 DIAGNOSIS — I5033 Acute on chronic diastolic (congestive) heart failure: Secondary | ICD-10-CM | POA: Diagnosis not present

## 2024-03-18 DIAGNOSIS — E8729 Other acidosis: Secondary | ICD-10-CM | POA: Diagnosis not present

## 2024-03-18 DIAGNOSIS — I2609 Other pulmonary embolism with acute cor pulmonale: Secondary | ICD-10-CM | POA: Diagnosis present

## 2024-03-18 DIAGNOSIS — E873 Alkalosis: Secondary | ICD-10-CM | POA: Diagnosis present

## 2024-03-18 DIAGNOSIS — Z888 Allergy status to other drugs, medicaments and biological substances status: Secondary | ICD-10-CM

## 2024-03-18 DIAGNOSIS — Z881 Allergy status to other antibiotic agents status: Secondary | ICD-10-CM

## 2024-03-18 DIAGNOSIS — E66811 Obesity, class 1: Secondary | ICD-10-CM | POA: Diagnosis present

## 2024-03-18 DIAGNOSIS — E1165 Type 2 diabetes mellitus with hyperglycemia: Secondary | ICD-10-CM | POA: Diagnosis not present

## 2024-03-18 DIAGNOSIS — Z79899 Other long term (current) drug therapy: Secondary | ICD-10-CM | POA: Diagnosis not present

## 2024-03-18 LAB — CBC
HCT: 47.7 % — ABNORMAL HIGH (ref 36.0–46.0)
Hemoglobin: 14.3 g/dL (ref 12.0–15.0)
MCH: 27.8 pg (ref 26.0–34.0)
MCHC: 30 g/dL (ref 30.0–36.0)
MCV: 92.8 fL (ref 80.0–100.0)
Platelets: 216 10*3/uL (ref 150–400)
RBC: 5.14 MIL/uL — ABNORMAL HIGH (ref 3.87–5.11)
RDW: 13.2 % (ref 11.5–15.5)
WBC: 9.8 10*3/uL (ref 4.0–10.5)
nRBC: 0 % (ref 0.0–0.2)

## 2024-03-18 LAB — TROPONIN I (HIGH SENSITIVITY)
Troponin I (High Sensitivity): 42 ng/L — ABNORMAL HIGH (ref <18)
Troponin I (High Sensitivity): 41 ng/L — ABNORMAL HIGH (ref <18)

## 2024-03-18 LAB — BRAIN NATRIURETIC PEPTIDE: B Natriuretic Peptide: 64.3 pg/mL (ref 0.0–100.0)

## 2024-03-18 LAB — BASIC METABOLIC PANEL WITH GFR
Anion gap: 11 (ref 5–15)
BUN: 22 mg/dL (ref 8–23)
CO2: 38 mmol/L — ABNORMAL HIGH (ref 22–32)
Calcium: 9.5 mg/dL (ref 8.9–10.3)
Chloride: 91 mmol/L — ABNORMAL LOW (ref 98–111)
Creatinine, Ser: 0.6 mg/dL (ref 0.44–1.00)
GFR, Estimated: >60 mL/min
Glucose, Bld: 206 mg/dL — ABNORMAL HIGH (ref 70–99)
Potassium: 4.6 mmol/L (ref 3.5–5.1)
Sodium: 140 mmol/L (ref 135–145)

## 2024-03-18 LAB — I-STAT VENOUS BLOOD GAS, ED
Acid-Base Excess: 16 mmol/L — ABNORMAL HIGH (ref 0.0–2.0)
Bicarbonate: 47.6 mmol/L — ABNORMAL HIGH (ref 20.0–28.0)
Calcium, Ion: 1.21 mmol/L (ref 1.15–1.40)
HCT: 45 % (ref 36.0–46.0)
Hemoglobin: 15.3 g/dL — ABNORMAL HIGH (ref 12.0–15.0)
O2 Saturation: 71 %
Potassium: 4.6 mmol/L (ref 3.5–5.1)
Sodium: 140 mmol/L (ref 135–145)
TCO2: >50 mmol/L — ABNORMAL HIGH (ref 22–32)
pCO2, Ven: 90.9 mmHg (ref 44–60)
pH, Ven: 7.327 (ref 7.25–7.43)
pO2, Ven: 43 mmHg (ref 32–45)

## 2024-03-18 LAB — CBG MONITORING, ED: Glucose-Capillary: 144 mg/dL — ABNORMAL HIGH (ref 70–99)

## 2024-03-18 MED ORDER — MIDODRINE HCL 5 MG PO TABS: 10.0000 mg | ORAL_TABLET | ORAL | Status: AC

## 2024-03-18 MED ORDER — FUROSEMIDE 10 MG/ML IJ SOLN
20.0000 mg | Freq: Two times a day (BID) | INTRAMUSCULAR | Status: AC
  Administered 2024-03-19 (×2): 20 mg via INTRAVENOUS
  Filled 2024-03-18 (×3): qty 2

## 2024-03-18 MED ORDER — PROCHLORPERAZINE EDISYLATE 10 MG/2ML IJ SOLN: 5.0000 mg | Freq: Four times a day (QID) | INTRAMUSCULAR | Status: DC | PRN

## 2024-03-18 MED ORDER — INSULIN ASPART 100 UNIT/ML IJ SOLN
0.0000 [IU] | Freq: Three times a day (TID) | INTRAMUSCULAR | Status: DC
  Administered 2024-03-19: 2 [IU] via SUBCUTANEOUS
  Administered 2024-03-19 – 2024-03-20 (×2): 5 [IU] via SUBCUTANEOUS
  Administered 2024-03-20: 3 [IU] via SUBCUTANEOUS
  Administered 2024-03-20: 7 [IU] via SUBCUTANEOUS
  Administered 2024-03-21: 2 [IU] via SUBCUTANEOUS
  Administered 2024-03-21: 9 [IU] via SUBCUTANEOUS
  Administered 2024-03-21: 2 [IU] via SUBCUTANEOUS

## 2024-03-18 MED ORDER — POLYETHYLENE GLYCOL 3350 17 G PO PACK: 17.0000 g | PACK | Freq: Every day | ORAL | Status: DC | PRN

## 2024-03-18 MED ORDER — MELATONIN 5 MG PO TABS
5.0000 mg | ORAL_TABLET | Freq: Every evening | ORAL | Status: DC | PRN
  Administered 2024-03-22: 5 mg via ORAL
  Filled 2024-03-18: qty 1

## 2024-03-18 MED ORDER — IPRATROPIUM-ALBUTEROL 0.5-2.5 (3) MG/3ML IN SOLN
3.0000 mL | Freq: Once | RESPIRATORY_TRACT | Status: AC
  Administered 2024-03-18: 3 mL via RESPIRATORY_TRACT
  Filled 2024-03-18: qty 3

## 2024-03-18 MED ORDER — GUAIFENESIN-DM 100-10 MG/5ML PO SYRP
5.0000 mL | ORAL_SOLUTION | ORAL | Status: DC | PRN
  Administered 2024-03-21 (×2): 5 mL via ORAL
  Filled 2024-03-18 (×2): qty 5

## 2024-03-18 MED ORDER — ACETAMINOPHEN 325 MG PO TABS
650.0000 mg | ORAL_TABLET | Freq: Four times a day (QID) | ORAL | Status: DC | PRN
  Administered 2024-03-20: 650 mg via ORAL
  Filled 2024-03-18: qty 2

## 2024-03-18 MED ORDER — FUROSEMIDE 10 MG/ML IJ SOLN
20.0000 mg | Freq: Once | INTRAMUSCULAR | Status: AC
  Administered 2024-03-18: 20 mg via INTRAVENOUS
  Filled 2024-03-18: qty 2

## 2024-03-18 MED ORDER — INSULIN ASPART 100 UNIT/ML IJ SOLN
0.0000 [IU] | Freq: Every day | INTRAMUSCULAR | Status: DC
  Administered 2024-03-19: 2 [IU] via SUBCUTANEOUS
  Administered 2024-03-20: 4 [IU] via SUBCUTANEOUS

## 2024-03-18 MED ORDER — ENOXAPARIN SODIUM 40 MG/0.4ML IJ SOSY
40.0000 mg | PREFILLED_SYRINGE | INTRAMUSCULAR | Status: DC
  Administered 2024-03-19 – 2024-03-23 (×5): 40 mg via SUBCUTANEOUS
  Filled 2024-03-18 (×5): qty 0.4

## 2024-03-18 MED ORDER — LEVOFLOXACIN 500 MG PO TABS
500.0000 mg | ORAL_TABLET | Freq: Every day | ORAL | Status: DC
  Administered 2024-03-19 – 2024-03-23 (×5): 500 mg via ORAL
  Filled 2024-03-18 (×5): qty 1

## 2024-03-18 MED ORDER — BUDESON-GLYCOPYRROL-FORMOTEROL 160-9-4.8 MCG/ACT IN AERO
2.0000 | INHALATION_SPRAY | Freq: Two times a day (BID) | RESPIRATORY_TRACT | Status: DC
  Administered 2024-03-19: 2 via RESPIRATORY_TRACT
  Filled 2024-03-18 (×2): qty 5.9

## 2024-03-18 NOTE — Patient Instructions (Signed)
 Visit Information  Thank you for taking time to visit with me today. Please don't hesitate to contact me if I can be of assistance to you before our next scheduled telephone appointment.  Our next appointment is by telephone on Wednesday 03/24/24 at 11:00 am  Please call the care guide team at [phone] if you need to cancel or reschedule your appointment.   Patient Self Care Activities:  Attend all scheduled provider appointments Call provider office for new concerns or questions  Participate in Transition of Care Program/Attend TOC scheduled calls Take medications as prescribed   Continue pacing activity to avoid episodes of shortness of breath Continue using home oxygen  as prescribed- continue monitoring your oxygen  levels at home frequently Continue to follow your established action plan for episodes of shortness of breath- seek emergency care if you feel your condition is worsening and you are struggling to breathe Continue working with the home health team that is involved in your care once they make their first home visit If you believe your condition is getting worse- contact your care providers (doctors) promptly- reaching out to your doctor early when you have concerns can prevent you from having to go to the hospital Please talk to your doctors about palliative and/ or hospice care options  Following is a copy of your care plan:   Goals Addressed             This Visit's Progress    [facility] Transitions of Care (TOC) Care Plan   Worsening    Problems:  Recent Hospitalization for treatment of COPD Equipment/DME barrier rollator walker ordered at time of hospital discharge: patient has not yet received and Home Health services barrier: have heard from home health team- start of service/ initial visit scheduled for 03/19/24 Recent hospital admission May 26-28, 2025 for acute on chronic respiratory failure in setting of chronic home O2- patient declined hospice services at time of  hospitalization/ hospital discharge Fragile state of health, multiple progressing chronic health conditions- ongoing tobacco use 2 unplanned hospital admissions x last 6/ 12 months- both for acute on chronic respiratory failure  Goal:  Over the next 30 days, the patient will not experience hospital readmission  Interventions:  Transitions of Care:  week # 1/ day # 1 Durable Medical Equipment (DME) needs assessed with patient/caregiver Doctor Visits  - discussed the importance of doctor visits Communication with PCP re: successful enrollment in [facility] 30-day program; patient- caregiver report of patient's ongoing poor clinical condition post-hospital discharge on 03/17/24 Contacted provider for patient needs [employer]: to confirm rollator walker order at time of hospital discharge is in progress: spoke with "Jerrilyn Moras" [phone]): Jerrilyn Moras confirms they are awaiting hospital discharging provider to sign off on order, and have placed this request with hospital discharge provider: I requested that [employer] team contact patient to update- Josh agrees to send message to "[facility] team" and assures me they will call patient/ caregiver to update on status Contacted DME company for patient use of equipment Post discharge activity limitations prescribed by provider reviewed Basic triage completed with sister/ caregiver re: her report that patient is "worse" post-hospital discharge on 03/17/24- states "she is using all of her energy just to breathe;" "they never should have sent her home from the hospital like this yesterday;" reports patient is "alert;" but "is so weak and unable to do anything but try to get her breath; she is just as short of breath as she was when we first took her to the  hospital" Confirms continuing to use home O2 as per hospital discharge instructions: 2- 4 L/min; reviewed recent home SaO2 readings at home post-hospital discharge: reports "between 80-88%" with frequent checks at home Provided  education/ reinforcement around basic safe use of home O2- need to refrain from smoking around home O2 Confirmed/ reinforced action plan for shortness of breath: nebulizer solution, home O2, pursed lip breathing, conservation of energy, rescue and maintenance inhaler- sister/ caregiver verbalizes good understanding/ adherence to same Provided education around signs of hypoxia/ hypercarbia along with action plan for same  Reinforced need for patient to stay hydrated along with signs/ symptoms dehydration and corresponding action plan Discussed options for care if patient/ caregiver continues to worsen clinically/ feels medical evaluation needed prior to scheduled PCP provider office visit currently scheduled on 03/23/24 for hospital follow up Discussed need for goals of care discussion as per hospital discharge notes: provided education around difference between palliative care and hospice services/ role of both: encouraged patient and caregiver to consider this option for care as patient progresses post-hospital discharge Successfully enrolled into 30-day TOC program; provided my direct contact information should questions/ concerns/ needs arise post-TOC initial call, prior to next TOC 30-day program RN CM telephone visit     COPD Interventions: Provided instruction about proper use of medications used for management of COPD including inhalers Use of home oxygen   Patient Self Care Activities:  Attend all scheduled provider appointments Call provider office for new concerns or questions  Participate in Transition of Care Program/Attend TOC scheduled calls Take medications as prescribed   Continue pacing activity to avoid episodes of shortness of breath Continue using home oxygen  as prescribed- continue monitoring your oxygen  levels at home frequently Continue to follow your established action plan for episodes of shortness of breath- seek emergency care if you feel your condition is worsening and  you are struggling to breathe Continue working with the home health team that is involved in your care once they make their first home visit If you believe your condition is getting worse- contact your care providers (doctors) promptly- reaching out to your doctor early when you have concerns can prevent you from having to go to the hospital Please talk to your doctors about palliative and/ or hospice care options  Plan:  Telephone follow up appointment with care management team member scheduled for:  Wednesday 03/24/24 at 11:00 am  Plan for next week's call: Review medication adherence Review provider office visit/ labs/ medication changes from 03/18/24: hospital follow up scheduled 03/23/24 Review home health PT/ RN visits Reinforce action plan for COPD         Patient verbalizes understanding of instructions and care plan provided today and agrees to view in MyChart. Active MyChart status and patient understanding of how to access instructions and care plan via MyChart confirmed with patient.     Telephone follow up appointment with care management team member scheduled for:  Wednesday 03/24/24 at 11:00 am  If you are experiencing a Mental Health or Behavioral Health Crisis or need someone to talk to, please  call the Suicide and Crisis Lifeline: 988 call the [place]  National Suicide Prevention Lifeline: 518-760-0008 or TTY: (952)866-6033 TTY 629-662-0931) to talk to a trained counselor call 1-800-273-TALK (toll free, 24 hour hotline) go to [facility] [address] [place] [phone]) call the [facility] Crisis Line: 815-673-2218 call 911   Pls call/ message for questions,  Erlene Hawks, RN, BSN, Media planner  Transitions of Care  [facility] - Population Health  [place] [phone]: direct office

## 2024-03-18 NOTE — ED Triage Notes (Signed)
 Pt arrives POV c/o shob x 5 days. Pt was seen recently for same but says that it got worse when she got home. States that she has started the abx and other medications she was supposed to at home but has not had any relief.  Arrives on 3LNC baseline satting 85% and was turned up to 5 L in triage. Difficulty speaking complete sentences in triage

## 2024-03-18 NOTE — H&P (Signed)
 History and Physical  [name] UEA:[id number] DOB: Jan 15, 1956 DOA: 03/18/2024  Referring physician: Dr. Carylon Claude, EDP  PCP: Roslyn Coombe, MD  Outpatient Specialists: Pulmonary. Patient coming from: Home.  Chief Complaint: Shortness of breath  HPI: [name] is a 68 y.o. female with medical history significant for chronic HFrEF, COPD, chronic hypoxic respiratory failure on 3 L [place] at baseline, OSA, chronic hypercarbia on BiPAP, recently admitted for acute COPD exacerbation and discharged yesterday, who presents to the ER due to shortness of breath, orthopnea, wheezing and hypoxia on her baseline 3 L nasal cannula.  In the ER, on arrival her O2 saturation in the mid 80s on 3 L nasal cannula, turned up to 5 L to the low 90s conversational dyspnea noted on exam.  Chest x-ray revealed cardiomegaly with mild pulmonary congestion.  The patient received a dose of IV Lasix  20 mg x 1 in the ER with improvement of her symptomatology.  VBG was concerning for compensated respiratory acidosis with pCO2 of 90.9 and pH of 7.337.  TRH, hospitalist service, was asked to admit for acute on chronic hypercarbic hypoxic respiratory failure and suspected acute on chronic HFpEF superimposed by her recent acute COPD exacerbation.  ED Course: Temperature 98.4.  BP 140/52, pulse 62, respiratory 23, O2 saturation 99% on 3 L [place].  Lab studies notable for VBG pH 7.327/90.9/43.  Serum bicarb 38, glucose 206, creatinine 0.60 with GFR greater than 60, BNP 64, troponin 42, 41.  CBC/unremarkable  Review of Systems: Review of systems as noted in the HPI. All other systems reviewed and are negative.   Past Medical History:  Diagnosis Date   Bronchitis    CAD (coronary artery disease)    a. 02/2002: CABG x3 with LIMA to LAD, RIMA to RCA, and SVG to OM   CHF (congestive heart failure) (HCC)    COPD (chronic obstructive pulmonary disease) (HCC)    Diabetes (HCC)    Headache    Heart disease    Hyperlipidemia     Hypertension    Hypertensive emergency 07/01/2013   Impaired glucose tolerance 08/20/2014   Sinusitis    Past Surgical History:  Procedure Laterality Date   CORONARY ARTERY BYPASS GRAFT  2003   Triple bypass   ESOPHAGOGASTRODUODENOSCOPY Left 06/13/2014   Procedure: ESOPHAGOGASTRODUODENOSCOPY (EGD);  Surgeon: Tami Falcon, MD;  Location: [facility] ENDOSCOPY;  Service: Endoscopy;  Laterality: Left;   RIGID ESOPHAGOSCOPY N/A 06/12/2014   Procedure: RIGID ESOPHAGOSCOPY WITH FOREIGN BODY REMOVAL;  Surgeon: Lawence Press, MD;  Location: [facility] OR;  Service: ENT;  Laterality: N/A;    Social History:  reports that she quit smoking about 4 years ago. Her smoking use included cigarettes. She started smoking about 52 years ago. She has a 23.7 pack-year smoking history. She has never used smokeless tobacco. She reports current alcohol use of about 5.0 - 6.0 standard drinks of alcohol per week. She reports that she does not currently use drugs after having used the following drugs: Marijuana.   Allergies  Allergen Reactions   Chantix  [Varenicline ] Nausea And Vomiting   Augmentin  [Amoxicillin -Pot Clavulanate] Nausea And Vomiting    Family History  Problem Relation Age of Onset   Lung cancer Paternal Uncle    Heart disease Mother    Brain cancer Mother    Cancer Maternal Grandmother        colon   Stroke Other    Cancer Other        x 4 aunts   Lung  cancer Maternal Aunt    Suicidality Brother       Prior to Admission medications   Medication Sig Start Date End Date Taking? Authorizing Provider  acetaminophen  (TYLENOL ) 500 MG tablet Take 1,000 mg by mouth 2 (two) times daily as needed for headache, fever, moderate pain or mild pain.    [provider]  albuterol  (VENTOLIN  HFA) 108 (90 Base) MCG/ACT inhaler INHALE 1-2 PUFFS BY MOUTH EVERY 6 HOURS AS NEEDED FOR WHEEZE OR SHORTNESS OF BREATH 09/26/23   Lind Repine, MD  alendronate  (FOSAMAX ) 70 MG tablet Take 70 mg by mouth once a week. 12/04/23    [provider]  amLODipine  (NORVASC ) 5 MG tablet TAKE 1 TABLET (5 MG TOTAL) BY MOUTH DAILY. 06/02/23   Hugh Madura, MD  atorvastatin  (LIPITOR) 40 MG tablet Take 1 tablet (40 mg total) by mouth daily. 04/11/23   Roslyn Coombe, MD  bisoprolol  (ZEBETA ) 5 MG tablet TAKE 1 TABLET (5 MG TOTAL) BY MOUTH DAILY. 09/05/23   Hugh Madura, MD  Cholecalciferol  (VITAMIN D -3 PO) Take 1 capsule by mouth in the morning.    [provider]  Cyanocobalamin  (VITAMIN B-12 PO) Take 1 tablet by mouth in the morning.    [provider]  Dulaglutide  (TRULICITY ) 3 MG/0.5ML SOAJ Inject 3 mg as directed once a week. Patient taking differently: Inject 1.5 mg as directed once a week. 03/11/24   Roslyn Coombe, MD  Dupilumab  (DUPIXENT ) 300 MG/2ML SOAJ Inject 300 mg into the skin every 14 (fourteen) days. 11/19/23   Lind Repine, MD  fluticasone  (FLONASE ) 50 MCG/ACT nasal spray Place 1 spray into both nostrils daily. 10/22/23   Vernestine Gondola, PA-C  Fluticasone -Umeclidin-Vilant (TRELEGY ELLIPTA ) 200-62.5-25 MCG/ACT AEPB INHALE 1 PUFF BY MOUTH EVERY DAY 07/05/23   Lind Repine, MD  furosemide  (LASIX ) 20 MG tablet Take 20 mg by mouth 3 (three) times a week.    [provider]  ipratropium-albuterol  (DUONEB) 0.5-2.5 (3) MG/3ML SOLN INHALE 3 ML BY NEBULIZER EVERY 6 HOURS AS NEEDED 09/19/23   Lind Repine, MD  isosorbide  mononitrate (IMDUR ) 60 MG 24 hr tablet TAKE 1.5 TABLETS (90 MG TOTAL) BY MOUTH DAILY. 07/07/23   Hugh Madura, MD  KLOR-CON  M10 10 MEQ tablet TAKE 1 TABLET BY MOUTH EVERY DAY 02/04/24   Roslyn Coombe, MD  levofloxacin  (LEVAQUIN ) 500 MG tablet Take 1 tablet (500 mg total) by mouth daily. 03/12/24   Roslyn Coombe, MD  MAGNESIUM  OXIDE PO Take 1 tablet by mouth at bedtime.    [provider]  metFORMIN  (GLUCOPHAGE -XR) 500 MG 24 hr tablet TAKE 2 TABLET BY MOUTH EVERY DAY WITH BREAKFAST 08/31/23   Roslyn Coombe, MD  Multiple Vitamins-Minerals (MULTIVITAMIN WOMEN 50+) TABS  Take 1 tablet by mouth in the morning.    [provider]  predniSONE  (DELTASONE ) 10 MG tablet 3 tabs by mouth per day for 3 days,2tabs per day for 3 days,1tab per day for 3 days 03/17/24   Theadore Finger, MD    Physical Exam: BP (!) 148/61   Pulse 65   Temp 98.4 F (36.9 C) (Oral)   Resp 20   SpO2 100%   General: 68 y.o. year-old female well developed well nourished in no acute distress.  Alert and oriented x3. Cardiovascular: Regular rate and rhythm with no rubs or gallops.  No thyromegaly or JVD noted.  No lower extremity edema. 2/4 pulses in all 4 extremities. Respiratory: Clear to  auscultation with no wheezes or rales. Good inspiratory effort. Abdomen: Soft nontender nondistended with normal bowel sounds x4 quadrants. Muskuloskeletal: No cyanosis, clubbing or edema noted bilaterally Neuro: CN II-XII intact, strength, sensation, reflexes Skin: No ulcerative lesions noted or rashes Psychiatry: Judgement and insight appear normal. Mood is appropriate for condition and setting          Labs on Admission:  Basic Metabolic Panel: Recent Labs  Lab 03/15/24 0419 03/15/24 1047 03/16/24 0729 03/17/24 0231 03/18/24 1624 03/18/24 1917  NA 136  --  138 141 140 140  K 4.1  --  5.2* 5.1 4.6 4.6  CL 93*  --  89* 92* 91*  --   CO2 35*  --  38* 36* 38*  --   GLUCOSE 244*  --  174* 185* 206*  --   BUN 14  --  24* 26* 22  --   CREATININE 0.68 0.62 0.59 0.62 0.60  --   CALCIUM  9.0  --  9.5 9.5 9.5  --   MG  --   --  2.5* 2.5*  --   --   PHOS  --   --  4.3 4.4  --   --    Liver Function Tests: Recent Labs  Lab 03/16/24 0729 03/17/24 0231  ALBUMIN 3.6 3.7   No results for input(s): "LIPASE", "AMYLASE" in the last 168 hours. No results for input(s): "AMMONIA" in the last 168 hours. CBC: Recent Labs  Lab 03/15/24 0419 03/15/24 1047 03/16/24 0729 03/17/24 0231 03/18/24 1624 03/18/24 1917  WBC 12.7* 9.8 7.4 9.7 9.8  --   HGB 14.2 14.1 14.0 14.1 14.3 15.3*  HCT 47.2*  47.7* 46.0 47.1* 47.7* 45.0  MCV 91.1 93.3 92.4 92.0 92.8  --   PLT 202 175 172 177 216  --    Cardiac Enzymes: No results for input(s): "CKTOTAL", "CKMB", "CKMBINDEX", "TROPONINI" in the last 168 hours.  BNP (last 3 results) Recent Labs    12/07/23 2228 03/15/24 0419 03/18/24 1624  BNP 133.0* 53.2 64.3    ProBNP (last 3 results) No results for input(s): "PROBNP" in the last 8760 hours.  CBG: Recent Labs  Lab 03/16/24 1425 03/16/24 1747 03/16/24 2220 03/17/24 0624 03/17/24 0839  GLUCAP 217* 257* 164* 176* 237*    Radiological Exams on Admission: DG Chest 2 View Result Date: 03/18/2024 CLINICAL DATA:  Shortness of breath. EXAM: CHEST - 2 VIEW COMPARISON:  Chest radiograph dated 03/15/2024. FINDINGS: Mild cardiomegaly with mild central vascular congestion. No focal consolidation, pleural effusion pneumothorax. Median sternotomy wires and CABG vascular clips. No acute osseous pathology. IMPRESSION: Mild cardiomegaly with mild central vascular congestion. Electronically Signed   By: Angus Bark M.D.   On: 03/18/2024 17:32    EKG: I independently viewed the EKG done and my findings are as followed: Sinus rhythm rate of 90.  Nonspecific ST-T changes.  QTc 424.  Assessment/Plan Present on Admission:  Acute on chronic hypoxic respiratory failure (HCC)  Principal Problem:   Acute on chronic hypoxic respiratory failure (HCC)  Acute on chronic hypoxic and hypercarbic respiratory failure suspect secondary to mild pulmonary edema with concern for acute on chronic HFpEF At baseline 3 L nasal cannula and BiPAP for CO2 retention Initially requiring 5 to 6 L to maintain a saturation above 90%, wean down to baseline of 3 L [place] after diuresing. Continue IV diuresing, as blood pressure allows. Wean off O2 supplementation as tolerated. Early mobilization is advised  Acute on chronic HFpEF Test today  echo done on 05/29/2022 revealed LVEF 55 to 60% with grade 2 diastolic  dysfunction. Follow repeat 2D echo Continue IV diuresing Monitor strict I's and O's and daily weight  OSA with CO2 retention Compensated acute respiratory acidosis BiPAP nightly and during naps  Obesity BMI 34 Recommend weight loss outpatient  Follow-up with PCP.  Type 2 diabetes with hyperglycemia Last hemoglobin A1c 7.4 on 03/11/2024 Heart healthy carb modified diet Insulin  sign scale.  Non anion gap metabolic alkalosis Serum bicarb 38, likely compensatory from chronic respiratory acidosis   Critical care time: 55 minutes.   DVT prophylaxis: Subcu Lovenox  daily.  Code Status: Full code.  Family Communication: None at bedside.  Disposition Plan: Admitted to progressive care unit.  Consults called: None.  Admission status: Inpatient status.   Status is: Inpatient The patient requires at least 2 midnights for further evaluation and treatment of present condition.   Bary Boss MD [facility] Pager [phone]  If 7PM-7AM, please contact night-coverage [email or web address] Password [email or web address]  03/18/2024, 10:20 PM

## 2024-03-18 NOTE — Transitions of Care (Post Inpatient/ED Visit) (Addendum)
 03/18/2024  Name: [name] MRN: [id number] DOB: June 13, 1956  Today's TOC FU Call Status: Today's TOC FU Call Status:: Successful TOC FU Call Completed TOC FU Call Complete Date: 03/18/24 Patient's Name and Date of Birth confirmed.  Transition Care Management Follow-up Telephone Call Date of Discharge: 03/17/24 Discharge Facility: [facility] Type of Discharge: Inpatient Admission Primary Inpatient Discharge Diagnosis:: Acute on chronic Respiratory Failure with hypoxia How have you been since you were released from the hospital?: Worse (sister: "She still is doing terrible. They shouldn't have sent her home; she can hardly breathe and is not eating or drinking anything. Her oxygen  is on and she is using the inhaler and the nebulizer.  I think I may have to take her back to the hospital") Any questions or concerns?: Yes Patient Questions/Concerns:: sister reports ongoing "terrible" shortness of breath with occasional cough; patient not eating/ drinking, "no strength" "unable to get out of bed except for a few minutes to go to the bathroom" Patient Questions/Concerns Addressed: Notified Provider of Patient Questions/Concerns, Other: (Attempted triage over phone with sister- patient declined coming to phone after providing initial request to speak with sister; discussed options for urgent/ emergent care, home use of O2; inhalers/ nebulizers; sister reports saO2 levels "between 80-88%")  Items Reviewed: Did you receive and understand the discharge instructions provided?: Yes (thoroughly reviewed with patient who verbalizes good understanding of same) Medications obtained,verified, and reconciled?: Yes (Medications Reviewed) (Full medication reconciliation/ review completed; no concerns or discrepancies identified; confirmed patient obtained/ is taking all newly Rx'd medications as instructed; self-manages medications and denies questions/ concerns around medications today) Any new  allergies since your discharge?: No Dietary orders reviewed?: Yes Type of Diet Ordered:: Per sister: "Healthy, but she is not eating because her appetite is so poor and she is just so short of breath" Do you have support at home?: Yes People in Home [RPT]: sibling(s) Name of Support/Comfort Primary Source: Reports independent in self-care activities at baseline; sister reports "she is just laying around not doing anything because she is so short of breath, I am taking care of her as best as I can;" resides with supportive sister who assists as/ if needed/ indicated  Medications Reviewed Today: Medications Reviewed Today     Reviewed by Jacklyn Branan M, RN (Registered Nurse) on 03/18/24 at 1351  Med List Status: <None>   Medication Order Taking? Sig Documenting Provider Last Dose Status Informant  acetaminophen  (TYLENOL ) 500 MG tablet [id number] Yes Take 1,000 mg by mouth 2 (two) times daily as needed for headache, fever, moderate pain or mild pain. [provider] Taking Active Pharmacy Records, Family Member  albuterol  (VENTOLIN  HFA) 108 [phone] Base) MCG/ACT inhaler [id number] Yes INHALE 1-2 PUFFS BY MOUTH EVERY 6 HOURS AS NEEDED FOR WHEEZE OR SHORTNESS OF BREATH Lind Repine, MD Taking Active Pharmacy Records, Family Member  alendronate  (FOSAMAX ) 70 MG tablet [id number] Yes Take 70 mg by mouth once a week. [provider] Taking Active Family Member, Pharmacy Records  amLODipine  (NORVASC ) 5 MG tablet [id number] Yes TAKE 1 TABLET (5 MG TOTAL) BY MOUTH DAILY. Hugh Madura, MD Taking Active Pharmacy Records, Family Member  atorvastatin  (LIPITOR) 40 MG tablet [id number] Yes Take 1 tablet (40 mg total) by mouth daily. Roslyn Coombe, MD Taking Active Pharmacy Records, Family Member  bisoprolol  (ZEBETA ) 5 MG tablet [id number] Yes TAKE 1 TABLET (5 MG TOTAL) BY MOUTH DAILY. Hugh Madura, MD Taking Active Pharmacy Records, Family Member  Cholecalciferol (VITAMIN  D-3 PO) [id number] Yes Take 1  capsule by mouth in the morning. [provider] Taking Active Pharmacy Records, Family Member  Cyanocobalamin  (VITAMIN B-12 PO) [id number] Yes Take 1 tablet by mouth in the morning. [provider] Taking Active Pharmacy Records, Family Member  Dulaglutide  (TRULICITY ) 3 MG/0.5ML [place] [id number] Yes Inject 3 mg as directed once a week.  Patient taking differently: Inject 1.5 mg as directed once a week.   Roslyn Coombe, MD Taking Active Family Member, Pharmacy Records  Dupilumab  (DUPIXENT ) 300 MG/2ML [place] [id number] Yes Inject 300 mg into the skin every 14 (fourteen) days. Lind Repine, MD Taking Active Pharmacy Records, Family Member  fluticasone  (FLONASE ) 50 MCG/ACT nasal spray [id number] Yes Place 1 spray into both nostrils daily. Vernestine Gondola, PA-C Taking Active Pharmacy Records, Family Member  Fluticasone -Umeclidin-Vilant (TRELEGY ELLIPTA ) 200-62.5-25 MCG/ACT AEPB [id number] Yes INHALE 1 PUFF BY MOUTH EVERY DAY Lind Repine, MD Taking Active Pharmacy Records, Family Member  furosemide  (LASIX ) 20 MG tablet [id number] Yes Take 20 mg by mouth 3 (three) times a week. [provider] Taking Active Pharmacy Records, Family Member  ipratropium-albuterol  (DUONEB) 0.5-2.5 (3) MG/3ML SOLN [id number] Yes INHALE 3 ML BY NEBULIZER EVERY 6 HOURS AS NEEDED Lind Repine, MD Taking Active Pharmacy Records, Family Member  isosorbide  mononitrate (IMDUR ) 60 MG 24 hr tablet [id number] Yes TAKE 1.5 TABLETS (90 MG TOTAL) BY MOUTH DAILY. Hugh Madura, MD Taking Active Pharmacy Records, Family Member  KLOR-CON  M10 10 MEQ tablet [id number] Yes TAKE 1 TABLET BY MOUTH EVERY DAY Roslyn Coombe, MD Taking Active Family Member, Pharmacy Records  levofloxacin  (LEVAQUIN ) 500 MG tablet [id number] Yes Take 1 tablet (500 mg total) by mouth daily. Roslyn Coombe, MD Taking Active Family Member, Pharmacy Records           Med Note [facility], Gladies Lambing Mar 15, 2024  6:54 AM) 10 day course started on 03/12/24   MAGNESIUM  OXIDE PO [id number] Yes Take 1 tablet by mouth at bedtime. [provider] Taking Active Pharmacy Records, Family Member  metFORMIN  (GLUCOPHAGE -XR) 500 MG 24 hr tablet [id number] Yes TAKE 2 TABLET BY MOUTH EVERY DAY WITH BREAKFAST John, James W, MD Taking Active Pharmacy Records, Family Member  Multiple Vitamins-Minerals (MULTIVITAMIN WOMEN 50+) TABS [id number] Yes Take 1 tablet by mouth in the morning. [provider] Taking Active Pharmacy Records, Family Member  predniSONE  (DELTASONE ) 10 MG tablet [id number] Yes 3 tabs by mouth per day for 3 days,2tabs per day for 3 days,1tab per day for 3 days Theadore Finger, MD Taking Active            Home Care and Equipment/Supplies: Were Home Health Services Ordered?: Yes Name of Home Health Agency:: [facility] Has Agency set up a time to come to your home?: Yes First Home Health Visit Date: 03/19/24 Any new equipment or medical supplies ordered?: Yes (Rollator walker) Name of Medical supply agency?: [employer]: [phone]: Care coordination outreach completed: spoke with "Josh;" he reports Were you able to get the equipment/medical supplies?: No (verified per inpatient TOC note- "to be delivered to patient's home within 48 hours;" confirmed with sister that patient has active account with [employer]: she has phone nnumber and will call [employer] to follow up) Do you have any questions related to the use of the equipment/supplies?: No  Functional Questionnaire: Do you need assistance with bathing/showering or dressing?: Yes (sister reports was independent prior to admission; sister currently assisting with all care needs as  indicated/ needed) Do you need assistance with meal preparation?: Yes (sister reports was independent prior to admission; sister currently assisting with all care needs as indicated/ needed) Do you need assistance with eating?: Yes (sister reports was independent prior to admission; sister currently assisting with all  care needs as indicated/ needed) Do you have difficulty maintaining continence: No (sister reports was independent prior to admission; sister currently assisting with all care needs as indicated/ needed) Do you need assistance with getting out of bed/getting out of a chair/moving?: Yes (sister reports was independent prior to admission; sister currently assisting with all care needs as indicated/ needed) Do you have difficulty managing or taking your medications?: Yes (sister reports was independent prior to admission; sister currently assisting with all care needs as indicated/ needed)  Follow up appointments reviewed: PCP Follow-up appointment confirmed?: Yes Date of PCP follow-up appointment?: 03/23/24 Follow-up Provider: PCP- Dr. Autry Legions Specialist [facility] Follow-up appointment confirmed?: No Reason Specialist Follow-Up Not Confirmed: Patient has Specialist Provider Number and will Call for Appointment (patient's sister states she has called pulmonary provider this morning, and was told by schedulers that there are no appointments available "until the end of July") Do you need transportation to your follow-up appointment?: No Do you understand care options if your condition(s) worsen?: Yes-patient verbalized understanding  SDOH Interventions Today    Flowsheet Row Most Recent Value  SDOH Interventions   Food Insecurity Interventions Intervention Not Indicated  Housing Interventions Patient Declined  Transportation Interventions Intervention Not Indicated  [sister provides transportation]  Utilities Interventions Patient Declined      Successfully enrolled into 30-day TOC program; Provided my direct contact information should questions/ concerns/ needs arise post-TOC initial call, prior to next [facility] 30-day program RN CM telephone visit    See TOC assessment tabs- care plan for additional assessment/ TOC intervention information  Plan for next week's call: Review medication  adherence Review provider office visit/ labs/ medication changes from 03/18/24: hospital follow up scheduled 03/23/24 Review home health PT/ RN visits Reinforce action plan for COPD  Pls call/ message for questions,  Shantea Poulton Mckinney Hellon Vaccarella, RN, BSN, CCRN Alumnus RN Care Manager  Transitions of Care  [facility] - [facility] [phone]: direct office

## 2024-03-18 NOTE — ED Provider Notes (Signed)
 [place] EMERGENCY DEPARTMENT AT [facility] Provider Note   CSN: [id number] Arrival date & time: 03/18/24  1500     History  Chief Complaint  Patient presents with   Shortness of Breath    [name] is a 68 y.o. female.  HPI   68 year old female with past medical history of COPD on home oxygen , CAD, CHF, DM and other comorbidities presents emergency department with worsening breathing.  Patient was just discharged yesterday when she was admitted for what appeared to be a COPD exacerbation.  Had presumed respiratory infection and was on Levaquin , advised to finish this but is unclear if she has been compliant.  She endorses subjective fever and chills.  Continues to have cough productive of clear sputum, no hemoptysis.  Since being home she has had difficulty lying flat, anxiety, shortness of breath, increased oxygen  requirement.  Denies any acute chest pain, swelling of her lower legs, vomiting/diarrhea.  Home Medications Prior to Admission medications   Medication Sig Start Date End Date Taking? Authorizing Provider  acetaminophen  (TYLENOL ) 500 MG tablet Take 1,000 mg by mouth 2 (two) times daily as needed for headache, fever, moderate pain or mild pain.    [provider]  albuterol  (VENTOLIN  HFA) 108 (90 Base) MCG/ACT inhaler INHALE 1-2 PUFFS BY MOUTH EVERY 6 HOURS AS NEEDED FOR WHEEZE OR SHORTNESS OF BREATH 09/26/23   Lind Repine, MD  alendronate  (FOSAMAX ) 70 MG tablet Take 70 mg by mouth once a week. 12/04/23   [provider]  amLODipine  (NORVASC ) 5 MG tablet TAKE 1 TABLET (5 MG TOTAL) BY MOUTH DAILY. 06/02/23   Hugh Madura, MD  atorvastatin  (LIPITOR) 40 MG tablet Take 1 tablet (40 mg total) by mouth daily. 04/11/23   Roslyn Coombe, MD  bisoprolol  (ZEBETA ) 5 MG tablet TAKE 1 TABLET (5 MG TOTAL) BY MOUTH DAILY. 09/05/23   Hugh Madura, MD  Cholecalciferol (VITAMIN D -3 PO) Take 1 capsule by mouth in the morning.    [provider]   Cyanocobalamin  (VITAMIN B-12 PO) Take 1 tablet by mouth in the morning.    [provider]  Dulaglutide  (TRULICITY ) 3 MG/0.5ML SOAJ Inject 3 mg as directed once a week. Patient taking differently: Inject 1.5 mg as directed once a week. 03/11/24   Roslyn Coombe, MD  Dupilumab  (DUPIXENT ) 300 MG/2ML SOAJ Inject 300 mg into the skin every 14 (fourteen) days. 11/19/23   Lind Repine, MD  fluticasone  (FLONASE ) 50 MCG/ACT nasal spray Place 1 spray into both nostrils daily. 10/22/23   Vernestine Gondola, PA-C  Fluticasone -Umeclidin-Vilant (TRELEGY ELLIPTA ) 200-62.5-25 MCG/ACT AEPB INHALE 1 PUFF BY MOUTH EVERY DAY 07/05/23   Lind Repine, MD  furosemide  (LASIX ) 20 MG tablet Take 20 mg by mouth 3 (three) times a week.    [provider]  ipratropium-albuterol  (DUONEB) 0.5-2.5 (3) MG/3ML SOLN INHALE 3 ML BY NEBULIZER EVERY 6 HOURS AS NEEDED 09/19/23   Lind Repine, MD  isosorbide  mononitrate (IMDUR ) 60 MG 24 hr tablet TAKE 1.5 TABLETS (90 MG TOTAL) BY MOUTH DAILY. 07/07/23   Hugh Madura, MD  KLOR-CON  M10 10 MEQ tablet TAKE 1 TABLET BY MOUTH EVERY DAY 02/04/24   Roslyn Coombe, MD  levofloxacin  (LEVAQUIN ) 500 MG tablet Take 1 tablet (500 mg total) by mouth daily. 03/12/24   Roslyn Coombe, MD  MAGNESIUM  OXIDE PO Take 1 tablet by mouth at bedtime.    [provider]  metFORMIN  (GLUCOPHAGE -XR) 500 MG  24 hr tablet TAKE 2 TABLET BY MOUTH EVERY DAY WITH BREAKFAST 08/31/23   Roslyn Coombe, MD  Multiple Vitamins-Minerals (MULTIVITAMIN WOMEN 50+) TABS Take 1 tablet by mouth in the morning.    [provider]  predniSONE  (DELTASONE ) 10 MG tablet 3 tabs by mouth per day for 3 days,2tabs per day for 3 days,1tab per day for 3 days 03/17/24   Theadore Finger, MD      Allergies    Chantix  [varenicline ] and Augmentin  [amoxicillin -pot clavulanate]    Review of Systems   Review of Systems  Constitutional:  Positive for chills, fatigue and fever.  Respiratory:  Positive for cough, shortness  of breath and wheezing.   Cardiovascular:  Negative for chest pain, palpitations and leg swelling.  Gastrointestinal:  Negative for abdominal pain, diarrhea and vomiting.  Skin:  Negative for rash.  Neurological:  Negative for headaches.    Physical Exam Updated Vital Signs BP 128/67 (BP Location: Right Arm)   Pulse 85   Temp 98.6 F (37 C) (Oral)   Resp (!) 23   SpO2 100%  Physical Exam Vitals and nursing note reviewed.  Constitutional:      Appearance: Normal appearance. She is ill-appearing.  HENT:     Head: Normocephalic.     Mouth/Throat:     Mouth: Mucous membranes are moist.  Cardiovascular:     Rate and Rhythm: Normal rate.  Pulmonary:     Effort: Pulmonary effort is normal. Tachypnea present. No respiratory distress.     Breath sounds: Examination of the right-lower field reveals decreased breath sounds. Examination of the left-lower field reveals decreased breath sounds. Decreased breath sounds, wheezing and rales present.  Chest:     Chest wall: No tenderness.  Abdominal:     Palpations: Abdomen is soft.     Tenderness: There is no abdominal tenderness.  Musculoskeletal:     Right lower leg: No edema.     Left lower leg: No edema.  Skin:    General: Skin is warm.  Neurological:     Mental Status: She is alert and oriented to person, place, and time. Mental status is at baseline.  Psychiatric:        Mood and Affect: Mood normal.     ED Results / Procedures / Treatments   Labs (all labs ordered are listed, but only abnormal results are displayed) Labs Reviewed  BASIC METABOLIC PANEL WITH GFR - Abnormal; Notable for the following components:      Result Value   Chloride 91 (*)    CO2 38 (*)    Glucose, Bld 206 (*)    All other components within normal limits  CBC - Abnormal; Notable for the following components:   RBC 5.14 (*)    HCT 47.7 (*)    All other components within normal limits  BRAIN NATRIURETIC PEPTIDE  I-STAT VENOUS BLOOD GAS, ED   TROPONIN I (HIGH SENSITIVITY)    EKG EKG Interpretation Date/Time:  Thursday Mar 18 2024 16:04:49 EDT Ventricular Rate:  90 PR Interval:  141 QRS Duration:  87 QT Interval:  346 QTC Calculation: 424 R Axis:   79  Text Interpretation: Sinus rhythm No change Confirmed by Florentino Hurdle [phone]) on 03/18/2024 5:57:59 PM  Radiology DG Chest 2 View Result Date: 03/18/2024 CLINICAL DATA:  Shortness of breath. EXAM: CHEST - 2 VIEW COMPARISON:  Chest radiograph dated 03/15/2024. FINDINGS: Mild cardiomegaly with mild central vascular congestion. No focal consolidation, pleural effusion pneumothorax. Median sternotomy wires  and CABG vascular clips. No acute osseous pathology. IMPRESSION: Mild cardiomegaly with mild central vascular congestion. Electronically Signed   By: Angus Bark M.D.   On: 03/18/2024 17:32    Procedures Procedures    Medications Ordered in ED Medications  ipratropium-albuterol  (DUONEB) 0.5-2.5 (3) MG/3ML nebulizer solution 3 mL (3 mLs Nebulization Given 03/18/24 1902)  furosemide  (LASIX ) injection 20 mg (20 mg Intravenous Given 03/18/24 1855)    ED Course/ Medical Decision Making/ A&P                                 Medical Decision Making Amount and/or Complexity of Data Reviewed Labs: ordered. Radiology: ordered.  Risk Prescription drug management. Decision regarding hospitalization.   68 year old female presents to the emergency department with concern for shortness of breath.  Was just discharged yesterday after an admission for COPD exacerbation, on oral steroids and Levaquin .  Last night she was unable to lie flat secondary to shortness of breath, anxiety.  Patient typically wears 3 L nasal cannula, on arrival she was hypoxic to the low 80s, increased to 5 L nasal cannula.  Patient was tachypneic, tachycardic, diffuse rales and inspiratory/expiratory wheezing on lung auscultation.  EKG does not show any ischemic changes.  Chest x-ray shows  cardiomegaly with pulmonary vascular congestion.  Most of the blood work is baseline for the patient outside of an elevated troponin that is flat.  No active chest pain.  Last echo was in 2023 with a normal EF.  However with the orthopnea, new hypoxia, rales on lung auscultation, chest x-ray findings and now elevated troponin concern for new onset CHF.  After breathing treatment and diuresis patient is feeling improved, lung auscultation is improved but she is still at times tachypneic and conversationally dyspneic.  Patient will require inpatient evaluation and repeat echo.  Patients evaluation and results requires admission for further treatment and care.  Spoke with hospitalist, reviewed patient's ED course and they accept admission.  Patient agrees with admission plan, offers no new complaints and is stable/unchanged at time of admit.        Final Clinical Impression(s) / ED Diagnoses Final diagnoses:  None    Rx / DC Orders ED Discharge Orders     None         Flonnie Humphrey, DO 03/18/24 2245

## 2024-03-19 ENCOUNTER — Other Ambulatory Visit: Payer: Self-pay

## 2024-03-19 ENCOUNTER — Inpatient Hospital Stay (HOSPITAL_COMMUNITY)

## 2024-03-19 DIAGNOSIS — J9622 Acute and chronic respiratory failure with hypercapnia: Secondary | ICD-10-CM

## 2024-03-19 DIAGNOSIS — J9621 Acute and chronic respiratory failure with hypoxia: Secondary | ICD-10-CM | POA: Diagnosis not present

## 2024-03-19 DIAGNOSIS — J441 Chronic obstructive pulmonary disease with (acute) exacerbation: Secondary | ICD-10-CM

## 2024-03-19 DIAGNOSIS — R7989 Other specified abnormal findings of blood chemistry: Secondary | ICD-10-CM | POA: Diagnosis not present

## 2024-03-19 LAB — ECHOCARDIOGRAM COMPLETE
AR max vel: 2.26 cm2
AV Area VTI: 2.8 cm2
AV Area mean vel: 2.52 cm2
AV Mean grad: 4 mmHg
AV Peak grad: 10.4 mmHg
Ao pk vel: 1.61 m/s
Area-P 1/2: 3.93 cm2
Height: 61 in
S' Lateral: 2.4 cm
Weight: 2860.69 [oz_av]

## 2024-03-19 LAB — BASIC METABOLIC PANEL WITH GFR
Anion gap: 10 (ref 5–15)
BUN: 18 mg/dL (ref 8–23)
CO2: 42 mmol/L — ABNORMAL HIGH (ref 22–32)
Calcium: 9.6 mg/dL (ref 8.9–10.3)
Chloride: 91 mmol/L — ABNORMAL LOW (ref 98–111)
Creatinine, Ser: 0.55 mg/dL (ref 0.44–1.00)
GFR, Estimated: >60 mL/min (ref >60)
Glucose, Bld: 142 mg/dL — ABNORMAL HIGH (ref 70–99)
Potassium: 4.1 mmol/L (ref 3.5–5.1)
Sodium: 143 mmol/L (ref 135–145)

## 2024-03-19 LAB — BLOOD GAS, VENOUS
Acid-Base Excess: 24.5 mmol/L — ABNORMAL HIGH (ref 0.0–2.0)
Bicarbonate: 53.1 mmol/L — ABNORMAL HIGH (ref 20.0–28.0)
Drawn by: 42628
O2 Saturation: 91.6 %
Patient temperature: 37
pCO2, Ven: 73 mmHg (ref 44–60)
pH, Ven: 7.47 — ABNORMAL HIGH (ref 7.25–7.43)
pO2, Ven: 59 mmHg — ABNORMAL HIGH (ref 32–45)

## 2024-03-19 LAB — CBC
HCT: 47.6 % — ABNORMAL HIGH (ref 36.0–46.0)
Hemoglobin: 14.4 g/dL (ref 12.0–15.0)
MCH: 27.7 pg (ref 26.0–34.0)
MCHC: 30.3 g/dL (ref 30.0–36.0)
MCV: 91.7 fL (ref 80.0–100.0)
Platelets: 215 10*3/uL (ref 150–400)
RBC: 5.19 MIL/uL — ABNORMAL HIGH (ref 3.87–5.11)
RDW: 13.2 % (ref 11.5–15.5)
WBC: 11.8 10*3/uL — ABNORMAL HIGH (ref 4.0–10.5)
nRBC: 0 % (ref 0.0–0.2)

## 2024-03-19 LAB — MRSA NEXT GEN BY PCR, NASAL: MRSA by PCR Next Gen: NOT DETECTED

## 2024-03-19 LAB — GLUCOSE, CAPILLARY
Glucose-Capillary: 144 mg/dL — ABNORMAL HIGH (ref 70–99)
Glucose-Capillary: 200 mg/dL — ABNORMAL HIGH (ref 70–99)
Glucose-Capillary: 271 mg/dL — ABNORMAL HIGH (ref 70–99)
Glucose-Capillary: 222 mg/dL — ABNORMAL HIGH (ref 70–99)

## 2024-03-19 LAB — MAGNESIUM: Magnesium: 2 mg/dL (ref 1.7–2.4)

## 2024-03-19 LAB — PHOSPHORUS: Phosphorus: 2.7 mg/dL (ref 2.5–4.6)

## 2024-03-19 MED ORDER — MAGNESIUM OXIDE -MG SUPPLEMENT 400 (240 MG) MG PO TABS
400.0000 mg | ORAL_TABLET | Freq: Every day | ORAL | Status: DC
  Administered 2024-03-19 – 2024-03-22 (×4): 400 mg via ORAL
  Filled 2024-03-19 (×4): qty 1

## 2024-03-19 MED ORDER — ATORVASTATIN CALCIUM 40 MG PO TABS
40.0000 mg | ORAL_TABLET | Freq: Every day | ORAL | Status: DC
  Administered 2024-03-19 – 2024-03-23 (×5): 40 mg via ORAL
  Filled 2024-03-19 (×5): qty 1

## 2024-03-19 MED ORDER — VITAMIN D 25 MCG (1000 UNIT) PO TABS
1000.0000 [IU] | ORAL_TABLET | Freq: Every morning | ORAL | Status: DC
  Administered 2024-03-19 – 2024-03-23 (×5): 1000 [IU] via ORAL
  Filled 2024-03-19 (×5): qty 1

## 2024-03-19 MED ORDER — IPRATROPIUM-ALBUTEROL 0.5-2.5 (3) MG/3ML IN SOLN: 3.0000 mL | Freq: Four times a day (QID) | RESPIRATORY_TRACT | Status: DC

## 2024-03-19 MED ORDER — PREDNISONE 20 MG PO TABS
20.0000 mg | ORAL_TABLET | Freq: Two times a day (BID) | ORAL | Status: DC
  Administered 2024-03-19: 20 mg via ORAL
  Filled 2024-03-19: qty 1

## 2024-03-19 MED ORDER — ARFORMOTEROL TARTRATE 15 MCG/2ML IN NEBU
15.0000 ug | INHALATION_SOLUTION | Freq: Two times a day (BID) | RESPIRATORY_TRACT | Status: DC
  Administered 2024-03-19 – 2024-03-23 (×9): 15 ug via RESPIRATORY_TRACT
  Filled 2024-03-19 (×9): qty 2

## 2024-03-19 MED ORDER — REVEFENACIN 175 MCG/3ML IN SOLN
175.0000 ug | Freq: Every day | RESPIRATORY_TRACT | Status: DC
  Administered 2024-03-19 – 2024-03-23 (×5): 175 ug via RESPIRATORY_TRACT
  Filled 2024-03-19 (×5): qty 3

## 2024-03-19 MED ORDER — IPRATROPIUM-ALBUTEROL 0.5-2.5 (3) MG/3ML IN SOLN
3.0000 mL | Freq: Once | RESPIRATORY_TRACT | Status: AC
  Administered 2024-03-19: 3 mL via RESPIRATORY_TRACT

## 2024-03-19 MED ORDER — ISOSORBIDE MONONITRATE ER 60 MG PO TB24
90.0000 mg | ORAL_TABLET | Freq: Every day | ORAL | Status: DC
  Administered 2024-03-19 – 2024-03-23 (×5): 90 mg via ORAL
  Filled 2024-03-19 (×5): qty 1

## 2024-03-19 MED ORDER — LORAZEPAM 0.5 MG PO TABS: 0.5000 mg | ORAL_TABLET | Freq: Four times a day (QID) | ORAL | Status: DC | PRN

## 2024-03-19 MED ORDER — METHYLPREDNISOLONE SODIUM SUCC 125 MG IJ SOLR
120.0000 mg | INTRAMUSCULAR | Status: DC
  Administered 2024-03-19 – 2024-03-21 (×3): 120 mg via INTRAVENOUS
  Filled 2024-03-19 (×3): qty 2

## 2024-03-19 MED ORDER — BISOPROLOL FUMARATE 5 MG PO TABS
5.0000 mg | ORAL_TABLET | Freq: Every day | ORAL | Status: DC
  Administered 2024-03-19 – 2024-03-23 (×5): 5 mg via ORAL
  Filled 2024-03-19 (×5): qty 1

## 2024-03-19 MED ORDER — IPRATROPIUM-ALBUTEROL 0.5-2.5 (3) MG/3ML IN SOLN
3.0000 mL | Freq: Four times a day (QID) | RESPIRATORY_TRACT | Status: DC
  Filled 2024-03-19: qty 3

## 2024-03-19 MED ORDER — BUDESONIDE 0.5 MG/2ML IN SUSP
0.5000 mg | Freq: Two times a day (BID) | RESPIRATORY_TRACT | Status: DC
  Administered 2024-03-19 – 2024-03-23 (×9): 0.5 mg via RESPIRATORY_TRACT
  Filled 2024-03-19 (×9): qty 2

## 2024-03-19 MED ORDER — VITAMIN B-12 1000 MCG PO TABS
500.0000 ug | ORAL_TABLET | Freq: Every morning | ORAL | Status: DC
  Administered 2024-03-19 – 2024-03-23 (×5): 500 ug via ORAL
  Filled 2024-03-19 (×5): qty 1

## 2024-03-19 MED ORDER — FUROSEMIDE 10 MG/ML IJ SOLN
40.0000 mg | Freq: Every day | INTRAMUSCULAR | Status: DC
  Administered 2024-03-19 – 2024-03-22 (×4): 40 mg via INTRAVENOUS
  Filled 2024-03-19 (×4): qty 4

## 2024-03-19 MED ORDER — ADULT MULTIVITAMIN W/MINERALS CH
1.0000 | ORAL_TABLET | Freq: Every morning | ORAL | Status: DC
  Administered 2024-03-19 – 2024-03-23 (×5): 1 via ORAL
  Filled 2024-03-19 (×5): qty 1

## 2024-03-19 MED ORDER — ALBUTEROL SULFATE (2.5 MG/3ML) 0.083% IN NEBU
2.5000 mg | INHALATION_SOLUTION | RESPIRATORY_TRACT | Status: DC | PRN
  Administered 2024-03-21: 2.5 mg via RESPIRATORY_TRACT
  Filled 2024-03-19: qty 3

## 2024-03-19 MED ORDER — FUROSEMIDE 10 MG/ML IJ SOLN: 40.0000 mg | Freq: Once | INTRAMUSCULAR | Status: DC

## 2024-03-19 MED ORDER — FLUTICASONE PROPIONATE 50 MCG/ACT NA SUSP
1.0000 | Freq: Every day | NASAL | Status: DC
  Administered 2024-03-19 – 2024-03-23 (×5): 1 via NASAL
  Filled 2024-03-19: qty 16

## 2024-03-19 MED ORDER — AMLODIPINE BESYLATE 5 MG PO TABS
5.0000 mg | ORAL_TABLET | Freq: Every day | ORAL | Status: DC
  Administered 2024-03-19 – 2024-03-23 (×5): 5 mg via ORAL
  Filled 2024-03-19 (×5): qty 1

## 2024-03-19 NOTE — Hospital Course (Signed)
 [name] is a 68 y.o. female with medical history significant for chronic HFpEF, COPD, chronic hypoxic respiratory failure on 3 L [place] at baseline, OSA, chronic hypercarbia, possible obesity hypoventilation syndrome on BiPAP nightly, who was recently admitted for acute COPD exacerbation and discharged 1 day prior to presentation presented to hospital with shortness of breath orthopnea wheezing and hypoxia on her baseline 3 L of oxygen .  In the ED, patient was tachycardic, pulse ox was mid 80s on 3 L nasal cannula.  Her O2 supplementation was turned up to 5-6 L with improvement of her O2 saturation to the low 90s.  Conversational dyspnea was noted on exam.  Chest x-ray revealed cardiomegaly with mild pulmonary congestion.   CBC and BMP unremarkable.  BNP of 64.  VBG was concerning for compensatory respiratory acidosis with pCO2 of 90.9 and pH of 7.337.  Patient received IV Lasix  in the ED with some improvement in her symptoms.  Patient was then considered for admission to hospital for further evaluation and treatment.  Acute on chronic hypoxic and hypercarbic respiratory failure likely secondary to pulmonary edema seen on chest x-ray, with concern for acute on chronic HFpEF At 3 L of oxygen  at baseline.  Uses BiPAP at nighttime for sleep apnea and CO2 retention.  Initially needed 5 to 6 L subsequently has been weaned down to 3 L.  Continue IV diuresis.    Acute on chronic HFpEF Last 2 D echo done on 05/29/2022 revealed LVEF 55 to 60% with grade 2 diastolic dysfunction. 2D echocardiogram.  Continue strict intake and output charting Daily weights.  OSA with suspected obesity hypoventilation syndrome, CO2 retention Compensatory chronic respiratory acidosis. BiPAP nightly and during naps. Last pCO2 90.9 with pH of 7.327 on VBG.   Class I obesity Body mass index is 33.78 kg/m.  Patient would benefit from ongoing weight loss as outpatient.   Type 2 diabetes with hyperglycemia Last hemoglobin A1c 7.4  on 03/11/2024.  Continue diabetic diet sliding scale insulin  Accu-Cheks.   Non anion gap metabolic alkalosis Serum bicarb 38, likely compensatory from chronic respiratory acidosis   Generalized weakness Will get PT OT evaluation.

## 2024-03-19 NOTE — Evaluation (Signed)
 Physical Therapy Evaluation Patient Details Name: [name] MRN: [id number] DOB: April 03, 1956 Today's Date: 03/19/2024  History of Present Illness  Pt is a 68 yo female who presented to [facility] ED on 03/18/24 due to recurrent SOB with orthopnea, wheezing, and hypoxia on baseline 3L continuous O2 through nasal cannula. Of note - recent admissions 03/15/24 to 03/17/24 and in 11/2023 due to chronic resp failure in setting of COPD exacerbation. PMH: chronic hypoxic resp failure, HFpEF, COPD, OSA on BiPAP nightly, chronic hypercarbia, DM, CAD, HTN, HLD   Clinical Impression  Pt in recliner upon arrival and agreeable to PT eval. Before recent admit, pt was independent for mobility with no AD. During admit from 5/26-5/28, pt was ModI with rollator. In today's session, pt was able to stand and ambulate 30 ft with RW and CGA. Multiple standing rest breaks due to feeling SOB, vitals below. Pt has 24/7 physical assist available at home upon d/c. Recommending post-acute HHPT to work towards independence with mobility. Pt reported waiting for rollator to be delivered to home after last admit. Pt would benefit from acute skilled PT with current functional limitations listed below (see PT Problem List). Acute PT to follow.  96% SpO2 on 3L at rest 94-96% SpO2 on 3L during gait         If plan is discharge home, recommend the following: Assistance with cooking/housework;Assist for transportation;Help with stairs or ramp for entrance   Can travel by private vehicle    Yes    Equipment Recommendations None recommended by PT (rollator to be delivered from prior admit)     Functional Status Assessment Patient has had a recent decline in their functional status and demonstrates the ability to make significant improvements in function in a reasonable and predictable amount of time.     Precautions / Restrictions Precautions Precautions: Fall Precaution/Restrictions Comments: watch O2 Restrictions Weight Bearing  Restrictions Per Provider Order: No      Mobility  Bed Mobility    General bed mobility comments: in recliner upon arrival    Transfers Overall transfer level: Needs assistance Equipment used: Rolling walker (2 wheels) Transfers: Sit to/from Stand Sit to Stand: Supervision   General transfer comment: supervision for safety    Ambulation/Gait Ambulation/Gait assistance: Supervision Gait Distance (Feet): 20 Feet Assistive device: Rolling walker (2 wheels) Gait Pattern/deviations: Step-through pattern, Decreased step length - right, Decreased stride length, Decreased step length - left Gait velocity: decreased     General Gait Details: x4 standing rest break due to feeling SOB, SpO2 >94% on 3L     Balance Overall balance assessment: Mild deficits observed, not formally tested       Pertinent Vitals/Pain Pain Assessment Pain Assessment: No/denies pain    Home Living Family/patient expects to be discharged to:: Private residence Living Arrangements: Other relatives (sister) Available Help at Discharge: Family;Available 24 hours/day Type of Home: House Home Access: Level entry       Home Layout: One level Home Equipment: BSC/3in1;Shower seat;Wheelchair - manual Additional Comments: 3L O2 at baseline    Prior Function Prior Level of Function : Independent/Modified Independent;Driving    Mobility Comments: Ind with no AD prior to last hospital admit, rollator during last hospital admission with pt waiting for device to be delivered to home ADLs Comments: stands for showers, ind with ADL, ind with med mgmt and cooking     Extremity/Trunk Assessment   Upper Extremity Assessment Upper Extremity Assessment: Defer to OT evaluation    Lower Extremity Assessment Lower  Extremity Assessment: Generalized weakness    Cervical / Trunk Assessment Cervical / Trunk Assessment: Normal  Communication   Communication Communication: No apparent difficulties     Cognition Arousal: Alert Behavior During Therapy: WFL for tasks assessed/performed, Anxious   PT - Cognitive impairments: No apparent impairments    Following commands: Intact       Cueing Cueing Techniques: Verbal cues      PT Assessment Patient needs continued PT services  PT Problem List Cardiopulmonary status limiting activity;Decreased mobility;Decreased activity tolerance;Decreased knowledge of use of DME;Decreased strength       PT Treatment Interventions DME instruction;Balance training;Gait training;Neuromuscular re-education;Stair training;Therapeutic exercise;Functional mobility training;Therapeutic activities;Patient/family education    PT Goals (Current goals can be found in the Care Plan section)  Acute Rehab PT Goals Patient Stated Goal: to get better PT Goal Formulation: With patient/family Time For Goal Achievement: 04/02/24 Potential to Achieve Goals: Good    Frequency Min 2X/week        AM-PAC PT "6 Clicks" Mobility  Outcome Measure Help needed turning from your back to your side while in a flat bed without using bedrails?: None Help needed moving from lying on your back to sitting on the side of a flat bed without using bedrails?: None Help needed moving to and from a bed to a chair (including a wheelchair)?: A Little Help needed standing up from a chair using your arms (e.g., wheelchair or bedside chair)?: A Little Help needed to walk in hospital room?: A Little Help needed climbing 3-5 steps with a railing? : A Lot 6 Click Score: 19    End of Session Equipment Utilized During Treatment: Oxygen ;Gait belt Activity Tolerance: Other (comment) (limited by feeling SOB) Patient left: in chair;with family/visitor present;with call bell/phone within reach Nurse Communication: Mobility status (O2 sats) PT Visit Diagnosis: Muscle weakness (generalized) (M62.81)    Time: 0950-1008 PT Time Calculation (min) (ACUTE ONLY): 18 min   Charges:   PT  Evaluation $PT Eval Low Complexity: 1 Low   PT General Charges $$ ACUTE PT VISIT: 1 Visit       Orysia Blas, PT, DPT Secure Chat Preferred  Rehab Office [phone]   Alissa April Adela Ades 03/19/2024, 10:17 AM

## 2024-03-19 NOTE — Evaluation (Signed)
 Occupational Therapy Evaluation Patient Details Name: [name] MRN: [id number] DOB: 27-Jul-1956 Today's Date: 03/19/2024   History of Present Illness   Pt is a 68 yo female who presented to [facility] ED on 03/18/24 due to recurrent SOB with orthopnea, wheezing, and hypoxia on baseline 3L continuous O2 through nasal cannula. Of note - recent admissions 03/15/24 to 03/17/24 and in 11/2023 due to chronic resp failure in setting of COPD exacerbation. PMH: chronic hypoxic resp failure, HFpEF, COPD, OSA on BiPAP nightly, chronic hypercarbia, DM, CAD, HTN, HLD     Clinical Impressions At baseline, pt is Independent with ADLs, light meal prep, medication management, and drives. Prior to last hospitalization, pt did not use an AD for functional mobility. During last admission, a Rollator was recommended and ordered for the pt, but has not yet been delivered. Pt now presents with significantly decreased activity tolerance, impaired cardiopulmonary status, and decreased safety and independence with functional tasks. Pt currently demonstrates ability to complete ADLs with Set up to Min assist and functional transfers/mobility with a RW with Supervision and occasional cues for safety with RW. Pt reporting feeling SOB and like "I can't catch my breath" during session with RR up to 25 and O2 sat >/93% on 3L continuous O2 through nasal cannula throughout session. Pt HR in the upper-80s to low-100s throughout session. Pt requesting to be placed back on BiPAP with RN notified. Pt participated well in session and will benefit from acute skilled OT services to address deficits outlined below and increase safety and independence with functional tasks. Post acute discharge, pt will benefit from [facility] OT to maximize rehab potential and decrease risk of rehospitalization.      If plan is discharge home, recommend the following:   A little help with walking and/or transfers;A little help with bathing/dressing/bathroom;Assistance  with cooking/housework;Assist for transportation;Help with stairs or ramp for entrance     Functional Status Assessment   Patient has had a recent decline in their functional status and demonstrates the ability to make significant improvements in function in a reasonable and predictable amount of time.     Equipment Recommendations   None recommended by OT (Pt already has needed equipment)     Recommendations for Other Services         Precautions/Restrictions   Precautions Precautions: Fall Precaution/Restrictions Comments: watch O2 Restrictions Weight Bearing Restrictions Per Provider Order: No     Mobility Bed Mobility Overal bed mobility: Needs Assistance Bed Mobility: Sit to Supine       Sit to supine: Supervision        Transfers Overall transfer level: Needs assistance Equipment used: Rolling walker (2 wheels) Transfers: Sit to/from Stand, Bed to chair/wheelchair/BSC Sit to Stand: Supervision     Step pivot transfers: Supervision     General transfer comment: Supervision for safety with occasional cues needed for safety with walker (ex. keeping RW close to her through entire transfer)      Balance Overall balance assessment: Mild deficits observed, not formally tested                                         ADL either performed or assessed with clinical judgement   ADL Overall ADL's : Needs assistance/impaired Eating/Feeding: Set up;Sitting   Grooming: Supervision/safety;Standing   Upper Body Bathing: Contact guard assist;Minimal assistance;Sitting   Lower Body Bathing: Contact guard assist;Minimal assistance;Sit to/from stand;Sitting/lateral  leans   Upper Body Dressing : Contact guard assist;Sitting   Lower Body Dressing: Contact guard assist;Sit to/from stand;Sitting/lateral leans   Toilet Transfer: Supervision/safety;Ambulation;Rolling walker (2 wheels);BSC/3in1;Cueing for safety Toilet Transfer Details (indicate  cue type and reason): cues for safety with RW Toileting- Clothing Manipulation and Hygiene: Supervision/safety;Sitting/lateral lean;Sit to/from stand Toileting - Clothing Manipulation Details (indicate cue type and reason): simulated chair to bed     Functional mobility during ADLs: Supervision/safety;Rolling walker (2 wheels) General ADL Comments: Pt with significantly decreased activity tolerance with pt fatiguing quickly and requiring frequent rest breaks due to SOB and feeling "not able to catch my breath: with pt O2 sat >/93% on 3L continuous O2 through nasal cannula throughout session.     Vision Ability to See in Adequate Light: 0 Adequate Patient Visual Report: No change from baseline Vision Assessment?: No apparent visual deficits     Perception         Praxis         Pertinent Vitals/Pain Pain Assessment Pain Assessment: No/denies pain     Extremity/Trunk Assessment Upper Extremity Assessment Upper Extremity Assessment: Right hand dominant;Overall [facility] for tasks assessed   Lower Extremity Assessment Lower Extremity Assessment: Defer to PT evaluation   Cervical / Trunk Assessment Cervical / Trunk Assessment: Normal   Communication Communication Communication: No apparent difficulties   Cognition Arousal: Alert Behavior During Therapy: WFL for tasks assessed/performed, Anxious Cognition: No apparent impairments             OT - Cognition Comments: Pt AAOx4 and pleasant throughout session. Pt with signs of anxiety related to feeling SOB. Pt cognition WFL for tasks assessed.                 Following commands: Intact       Cueing  General Comments   Cueing Techniques: Verbal cues  Pt reporting feeling of SOB and feeling like she "can't catch my breath" with pt O2 sats >/93% on 3L continuous O2 through nasal cannula throughout session and with RR up to 25. Pt HR in the upper-80s to low-100s during session. Pt requesting being put back on BiPAP  with RN notified. Pt's son present during session.   Exercises     Shoulder Instructions      Home Living Family/patient expects to be discharged to:: Private residence Living Arrangements: Other relatives (sister) Available Help at Discharge: Family;Available 24 hours/day Type of Home: House Home Access: Level entry     Home Layout: One level     Bathroom Shower/Tub: Producer, television/film/video: Standard Bathroom Accessibility: Yes How Accessible: Accessible via walker Home Equipment: BSC/3in1;Shower seat;Wheelchair - manual;Other (comment) (O2 equipment)   Additional Comments: 3L O2 at baseline      Prior Functioning/Environment Prior Level of Function : Independent/Modified Independent;Driving             Mobility Comments: Ind with no AD prior to last hospital admit; rollator recommended and ordered during last hospital admission (5/26-5/28/25) with pt waiting for device to be delivered to home ADLs Comments: Independent with ADLs, meal prep, medication management, and drives. Pt reports she stands for showers.    OT Problem List: Decreased activity tolerance;Impaired balance (sitting and/or standing);Cardiopulmonary status limiting activity;Decreased knowledge of use of DME or AE   OT Treatment/Interventions: Self-care/ADL training;Therapeutic exercise;DME and/or AE instruction;Energy conservation;Therapeutic activities;Patient/family education;Balance training      OT Goals(Current goals can be found in the care plan section)   Acute Rehab OT Goals Patient  Stated Goal: to be able to breath easier and return home OT Goal Formulation: With patient Time For Goal Achievement: 04/02/24 Potential to Achieve Goals: Good ADL Goals Pt Will Perform Lower Body Bathing: with modified independence;sitting/lateral leans;sit to/from stand Pt Will Perform Lower Body Dressing: with modified independence;sitting/lateral leans;sit to/from stand Pt Will Transfer to  Toilet: with modified independence;ambulating;regular height toilet (with least restrictive AD) Additional ADL Goal #1: Patient will demonstrate ability to Independently state 3 energy conservation strategies to improve daily management of chronic health conditions and increase safety and independence with functional tasks.   OT Frequency:  Min 1X/week    Co-evaluation              AM-PAC OT "6 Clicks" Daily Activity     Outcome Measure Help from another person eating meals?: A Little Help from another person taking care of personal grooming?: A Little Help from another person toileting, which includes using toliet, bedpan, or urinal?: A Little Help from another person bathing (including washing, rinsing, drying)?: A Little Help from another person to put on and taking off regular upper body clothing?: A Little Help from another person to put on and taking off regular lower body clothing?: A Little 6 Click Score: 18   End of Session Equipment Utilized During Treatment: Oxygen ;Rolling walker (2 wheels) Nurse Communication: Mobility status;Other (comment) (Pt reporting SOB with RR up to 25 and O2 sat >/93% on #L continuous O2 through [place] throughout session. Pt requesting to be placed back on BiPAP.)  Activity Tolerance: Patient tolerated treatment well;Treatment limited secondary to medical complications (Comment) (limited by SOB) Patient left: in bed;with call bell/phone within reach;with family/visitor present  OT Visit Diagnosis: Other abnormalities of gait and mobility (R26.89);Other (comment) (decreased activity tolerance)                Time: 1430-1446 OT Time Calculation (min): 16 min Charges:  OT General Charges $OT Visit: 1 Visit OT Evaluation $OT Eval Low Complexity: 1 Low  Suzannah Bettes "Kyle" M., OTR/L, MA Acute Rehab [phone]  Walt Gunner 03/19/2024, 3:11 PM

## 2024-03-19 NOTE — Progress Notes (Signed)
 PROGRESS NOTE  SIGNA CHEEK BJY:[id number] DOB: 11-05-1955 DOA: 03/18/2024 PCP: Roslyn Coombe, MD   LOS: 1 day   Brief narrative:  [name] is a 68 y.o. female with medical history significant for chronic HFpEF, COPD, chronic hypoxic respiratory failure on 3 L [place] at baseline, OSA, chronic hypercarbia, possible obesity hypoventilation syndrome on BiPAP nightly, who was recently admitted for acute COPD exacerbation and discharged 1 day prior to presentation presented to hospital with shortness of breath orthopnea wheezing and hypoxia on her baseline 3 L of oxygen .  In the ED, patient was tachycardic, pulse ox was mid 80s on 3 L nasal cannula.  Her O2 supplementation was turned up to 5-6 L with improvement of her O2 saturation to the low 90s.  Conversational dyspnea was noted on exam.  Chest x-ray revealed cardiomegaly with mild pulmonary congestion.   CBC and BMP unremarkable.  BNP of 64.  VBG was concerning for compensatory respiratory acidosis with pCO2 of 90.9 and pH of 7.337.  Patient received IV Lasix  in the ED with some improvement in her symptoms.  Patient was then considered for admission to hospital for further evaluation and treatment.     Assessment/Plan: Principal Problem:   Acute on chronic hypoxic respiratory failure (HCC)   Acute on chronic hypoxic and hypercarbic respiratory failure likely secondary to pulmonary edema seen on chest x-ray, with concern for acute on chronic HFpEF At 3 L of oxygen  at baseline.  Uses BiPAP at nighttime for sleep apnea and CO2 retention.  Initially needed 5 to 6 L subsequently has been weaned down to 3 L.  Continue IV diuresis.  Communicated with Dr. Villa Greaser pulmonary for consultation due to family request and recurrent admission to the hospital.  Patient states that she is not fully compliant with BiPAP at nighttime.   Acute on chronic HFpEF Last 2 D echo done on 05/29/2022 revealed LVEF 55 to 60% with grade 2 diastolic dysfunction. Continue  strict intake and output charting Daily weights.  No overt peripheral edema but will check 2D echocardiogram.  On IV Lasix  20 mg twice daily.  Continue strict intake and output charting.  OSA with suspected obesity hypoventilation syndrome, CO2 retention Compensatory chronic respiratory acidosis. BiPAP nightly and during naps. Last pCO2 90.9 with pH of 7.327 on VBG.   Class I obesity Body mass index is 33.78 kg/m. Patient would benefit from ongoing weight loss as outpatient.   Type 2 diabetes with hyperglycemia Last hemoglobin A1c 7.4 on 03/11/2024.  Continue diabetic diet sliding scale insulin  Accu-Cheks.   Non anion gap metabolic alkalosis Serum bicarb 38, likely compensatory from chronic respiratory acidosis   Generalized weakness Will get PT OT evaluation.   DVT prophylaxis: enoxaparin  (LOVENOX ) injection 40 mg Start: 03/19/24 1000   Disposition: Uncertain at this time, likely home with home health.  Status is: Inpatient Remains inpatient appropriate because: Pending clinical improvement, pulmonary evaluation, recurrent admissions, high risk of readmissions.    Code Status:     Code Status: Full Code  Family Communication: Spoke with the patient's sister at bedside.  Consultants: Pulmonary.  Communicated with Dr. Villa Greaser  Procedures: BiPAP at nighttime.  Anti-infectives:  Levaquin   Anti-infectives (From admission, onward)    Start     Dose/Rate Route Frequency Ordered Stop   03/19/24 1000  levofloxacin  (LEVAQUIN ) tablet 500 mg        500 mg Oral Daily 03/18/24 2222          Subjective: Today, patient was seen  and examined at bedside.  Patient states that she has some cough with clear phlegm production with dyspnea on exertion and orthopnea.  Has complaints of cough and chest pain on coughing.  No nausea vomiting fever chills or rigor.  Objective: Vitals:   03/19/24 0736 03/19/24 0947  BP: (!) 140/75   Pulse: 92 (!) 105  Resp: (!) 25 (!) 24  Temp:  98.6 F  (37 C)  SpO2: 92% 97%    Intake/Output Summary (Last 24 hours) at 03/19/2024 0951 Last data filed at 03/19/2024 0109 Gross per 24 hour  Intake --  Output 500 ml  Net -500 ml   Filed Weights   03/19/24 0012 03/19/24 0413  Weight: 81.5 kg 81.1 kg   Body mass index is 33.78 kg/m.   Physical Exam:  GENERAL: Patient is alert awake and oriented.  On 3 L of oxygen  by nasal cannula, obese built.  Sitting up in the chair. HENT: No scleral pallor or icterus. Pupils equally reactive to light. Oral mucosa is moist NECK: is supple, no gross swelling noted. CHEST:   Diminished breath sounds bilaterally.  No overt wheezes.  Coarse breath sounds noted. CVS: S1 and S2 heard, no murmur. Regular rate and rhythm.  ABDOMEN: Soft, non-tender, bowel sounds are present. EXTREMITIES: No obvious edema. CNS: Cranial nerves are intact. No focal motor deficits. SKIN: warm and dry without rashes.  Data Review: I have personally reviewed the following laboratory data and studies,  CBC: Recent Labs  Lab 03/15/24 1047 03/16/24 0729 03/17/24 0231 03/18/24 1624 03/18/24 1917 03/19/24 0515  WBC 9.8 7.4 9.7 9.8  --  11.8*  HGB 14.1 14.0 14.1 14.3 15.3* 14.4  HCT 47.7* 46.0 47.1* 47.7* 45.0 47.6*  MCV 93.3 92.4 92.0 92.8  --  91.7  PLT 175 172 177 216  --  215   Basic Metabolic Panel: Recent Labs  Lab 03/15/24 0419 03/15/24 1047 03/16/24 0729 03/17/24 0231 03/18/24 1624 03/18/24 1917 03/19/24 0515  NA 136  --  138 141 140 140 143  K 4.1  --  5.2* 5.1 4.6 4.6 4.1  CL 93*  --  89* 92* 91*  --  91*  CO2 35*  --  38* 36* 38*  --  42*  GLUCOSE 244*  --  174* 185* 206*  --  142*  BUN 14  --  24* 26* 22  --  18  CREATININE 0.68 0.62 0.59 0.62 0.60  --  0.55  CALCIUM  9.0  --  9.5 9.5 9.5  --  9.6  MG  --   --  2.5* 2.5*  --   --  2.0  PHOS  --   --  4.3 4.4  --   --  2.7   Liver Function Tests: Recent Labs  Lab 03/16/24 0729 03/17/24 0231  ALBUMIN 3.6 3.7   No results for input(s):  "LIPASE", "AMYLASE" in the last 168 hours. No results for input(s): "AMMONIA" in the last 168 hours. Cardiac Enzymes: No results for input(s): "CKTOTAL", "CKMB", "CKMBINDEX", "TROPONINI" in the last 168 hours. BNP (last 3 results) Recent Labs    12/07/23 2228 03/15/24 0419 03/18/24 1624  BNP 133.0* 53.2 64.3    ProBNP (last 3 results) No results for input(s): "PROBNP" in the last 8760 hours.  CBG: Recent Labs  Lab 03/16/24 2220 03/17/24 0624 03/17/24 0839 03/18/24 2304 03/19/24 0601  GLUCAP 164* 176* 237* 144* 144*   Recent Results (from the past 240 hours)  Resp panel by RT-PCR (  RSV, Flu A&B, Covid) Anterior Nasal Swab     Status: None   Collection Time: 03/15/24  4:59 AM   Specimen: Anterior Nasal Swab  Result Value Ref Range Status   SARS Coronavirus 2 by RT PCR NEGATIVE NEGATIVE Final   Influenza A by PCR NEGATIVE NEGATIVE Final   Influenza B by PCR NEGATIVE NEGATIVE Final    Comment: (NOTE) The Xpert Xpress SARS-CoV-2/FLU/RSV plus assay is intended as an aid in the diagnosis of influenza from Nasopharyngeal swab specimens and should not be used as a sole basis for treatment. Nasal washings and aspirates are unacceptable for Xpert Xpress SARS-CoV-2/FLU/RSV testing.  Fact Sheet for Patients: BloggerCourse.com  Fact Sheet for Healthcare Providers: SeriousBroker.it  This test is not yet approved or cleared by the [place]  FDA and has been authorized for detection and/or diagnosis of SARS-CoV-2 by FDA under an Emergency Use Authorization (EUA). This EUA will remain in effect (meaning this test can be used) for the duration of the COVID-19 declaration under Section 564(b)(1) of the Act, 21 U.S.C. section 360bbb-3(b)(1), unless the authorization is terminated or revoked.     Resp Syncytial Virus by PCR NEGATIVE NEGATIVE Final    Comment: (NOTE) Fact Sheet for  Patients: BloggerCourse.com  Fact Sheet for Healthcare Providers: SeriousBroker.it  This test is not yet approved or cleared by the [place]  FDA and has been authorized for detection and/or diagnosis of SARS-CoV-2 by FDA under an Emergency Use Authorization (EUA). This EUA will remain in effect (meaning this test can be used) for the duration of the COVID-19 declaration under Section 564(b)(1) of the Act, 21 U.S.C. section 360bbb-3(b)(1), unless the authorization is terminated or revoked.  Performed at [facility], [address], [place], [place] [address]   Respiratory (~20 pathogens) panel by PCR     Status: None   Collection Time: 03/15/24  4:59 AM   Specimen: Nasopharyngeal Swab; Respiratory  Result Value Ref Range Status   Adenovirus NOT DETECTED NOT DETECTED Final   Coronavirus 229E NOT DETECTED NOT DETECTED Final    Comment: (NOTE) The Coronavirus on the Respiratory Panel, DOES NOT test for the novel  Coronavirus (2019 nCoV)    Coronavirus HKU1 NOT DETECTED NOT DETECTED Final   Coronavirus NL63 NOT DETECTED NOT DETECTED Final   Coronavirus OC43 NOT DETECTED NOT DETECTED Final   Metapneumovirus NOT DETECTED NOT DETECTED Final   Rhinovirus / Enterovirus NOT DETECTED NOT DETECTED Final   Influenza A NOT DETECTED NOT DETECTED Final   Influenza B NOT DETECTED NOT DETECTED Final   Parainfluenza Virus 1 NOT DETECTED NOT DETECTED Final   Parainfluenza Virus 2 NOT DETECTED NOT DETECTED Final   Parainfluenza Virus 3 NOT DETECTED NOT DETECTED Final   Parainfluenza Virus 4 NOT DETECTED NOT DETECTED Final   Respiratory Syncytial Virus NOT DETECTED NOT DETECTED Final   Bordetella pertussis NOT DETECTED NOT DETECTED Final   Bordetella Parapertussis NOT DETECTED NOT DETECTED Final   Chlamydophila pneumoniae NOT DETECTED NOT DETECTED Final   Mycoplasma pneumoniae NOT DETECTED NOT DETECTED Final    Comment: Performed at  [facility], [address] [place], [place] [address]  MRSA Next Gen by PCR, Nasal     Status: None   Collection Time: 03/19/24 12:16 AM   Specimen: Nasal Mucosa; Nasal Swab  Result Value Ref Range Status   MRSA by PCR Next Gen NOT DETECTED NOT DETECTED Final    Comment: (NOTE) The GeneXpert MRSA Assay (FDA approved for NASAL  specimens only), is one component of a comprehensive MRSA colonization surveillance program. It is not intended to diagnose MRSA infection nor to guide or monitor treatment for MRSA infections. Test performance is not FDA approved in patients less than 49 years old. Performed at [facility], [address] [place], [place] [address]      Studies: DG Chest 2 View Result Date: 03/18/2024 CLINICAL DATA:  Shortness of breath. EXAM: CHEST - 2 VIEW COMPARISON:  Chest radiograph dated 03/15/2024. FINDINGS: Mild cardiomegaly with mild central vascular congestion. No focal consolidation, pleural effusion pneumothorax. Median sternotomy wires and CABG vascular clips. No acute osseous pathology. IMPRESSION: Mild cardiomegaly with mild central vascular congestion. Electronically Signed   By: Angus Bark M.D.   On: 03/18/2024 17:32      Rosena Conradi, MD  [facility] 03/19/2024  If 7PM-7AM, please contact night-coverage

## 2024-03-19 NOTE — Consult Note (Signed)
 NAME:  [name], MRN:  [id number], DOB:  09-20-1956, LOS: 1 ADMISSION DATE:  03/18/2024, CONSULTATION DATE:  03/19/2024   REFERRING MD:  Efrain Grant, TRH, CHIEF COMPLAINT: Shortness of breath  History of Present Illness:  68 year old smoker with severe COPD and mild tracheomalacia with chronic hypoxic & hypercarbic resp failure on o2 since 2018 , whom I follow in pulmonary clinic.  Last FEV1 in 2020 was noted to be 44% predicted/1.05.  She has been maintained on nocturnal NIV since 2024 and is on chronic prednisone  10 mg 11/19/23 started dupixent  300 mg SQ q 2 weeks Hosp 11/2023 acute on chronic hypoxic/hypercapnic respiratory failure due to COPD exacerbation due to coronavirus infection.  I last saw her in clinic 12/2023 and we plan to continue prednisone  She was hospitalized 03/15/2024 for 2 days.  She had just been treated with Levaquin  and prednisone  as an outpatient for exacerbation.  She required transient BiPAP, BNP was 53, she was to start 5/28 Came back to ER on 5/29 for shortness of breath, orthopnea and hypoxia to 80s on 3 L nasal cannula.  Chest x-ray showed cardiomegaly with pulmonary vascular congestion, improved with Lasix  VBG was 7.33/91, improved to 7.47/73, PCCM consulted for recurrent admission  Pertinent  Medical History  CABG HFpEF  Significant Hospital Events: Including procedures, antibiotic start and stop dates in addition to other pertinent events     Interim History / Subjective:  Complains of shortness of breath On BiPAP  Objective    Blood pressure (!) 154/70, pulse 95, temperature 98.6 F (37 C), temperature source Oral, resp. rate (!) 22, height 5\' 1"  (1.549 m), weight 81.1 kg, SpO2 97%.    Vent Mode: PSV FiO2 (%):  [35 %] 35 % Set Rate:  [16 bmp] 16 bmp PEEP:  [5 cmH20] 5 cmH20 Pressure Support:  [7 cmH20] 7 cmH20   Intake/Output Summary (Last 24 hours) at 03/19/2024 1119 Last data filed at 03/19/2024 0800 Gross per 24 hour  Intake --  Output 700  ml  Net -700 ml   Filed Weights   03/19/24 0012 03/19/24 0413  Weight: 81.5 kg 81.1 kg    Examination: Gen. Pleasant, well-nourished, in no distress, normal affect ENT - no pallor,icterus, no post nasal drip Neck: No JVD, no thyromegaly, no carotid bruits Lungs: no use of accessory muscles, no dullness to percussion, bilateral diffuse rhonchi Cardiovascular: Rhythm regular, heart sounds  normal, no murmurs or gallops, no peripheral edema Abdomen: soft and non-tender, no hepatosplenomegaly, BS normal. Musculoskeletal: No deformities, no cyanosis or clubbing Neuro:  alert, non focal    Labs normal electrolytes, mild leukocytosis, bicarbonate 38-42  Resolved problem list   Assessment and Plan    Acute on chronic hypoxic and hypercarbic respiratory failure CopD exacerbation Acute cor pulmonale  Cause for readmission is not clear, likely previous episode of COPD exacerbation was not fully resolved, she does not have left heart failure but chest x-ray did show some pulmonary vascular congestion.  BNP is normal, repeat echo is pending   - Will switch to triple therapy nebs -budesonide /Brovana /Yupelri  combination - Will escalate from prednisone  to IV Solu-Medrol  40 every 8 - Continue NIV nocturnally and as needed in the daytime. -Unfortunately dupilumab  has not had much beneficial effects for her she has been on this now for a few months.  She remains steroid-dependent with baseline 10 mg of prednisone   She is nearing end-stage COPD and does not rebound as well from an exacerbation.  I had a frank conversation  about advance directives with her and her partner.  She does desire mechanical ventilation unless her situation is terminal or really no hopes of recovering to her baseline state.  I explained to her that with her advanced COPD, there were no guarantees that we could liberate her from mechanical ventilation should we go down the route. Can involve palliative care for more  detailed discussion, based on her response to current mode of treatment  Best Practice (right click and "Reselect all SmartList Selections" daily)    Code Status:  full code Last date of multidisciplinary goals of care discussion [NA]  Labs   CBC: Recent Labs  Lab 03/15/24 1047 03/16/24 0729 03/17/24 0231 03/18/24 1624 03/18/24 1917 03/19/24 0515  WBC 9.8 7.4 9.7 9.8  --  11.8*  HGB 14.1 14.0 14.1 14.3 15.3* 14.4  HCT 47.7* 46.0 47.1* 47.7* 45.0 47.6*  MCV 93.3 92.4 92.0 92.8  --  91.7  PLT 175 172 177 216  --  215    Basic Metabolic Panel: Recent Labs  Lab 03/15/24 0419 03/15/24 1047 03/16/24 0729 03/17/24 0231 03/18/24 1624 03/18/24 1917 03/19/24 0515  NA 136  --  138 141 140 140 143  K 4.1  --  5.2* 5.1 4.6 4.6 4.1  CL 93*  --  89* 92* 91*  --  91*  CO2 35*  --  38* 36* 38*  --  42*  GLUCOSE 244*  --  174* 185* 206*  --  142*  BUN 14  --  24* 26* 22  --  18  CREATININE 0.68 0.62 0.59 0.62 0.60  --  0.55  CALCIUM  9.0  --  9.5 9.5 9.5  --  9.6  MG  --   --  2.5* 2.5*  --   --  2.0  PHOS  --   --  4.3 4.4  --   --  2.7   GFR: Estimated Creatinine Clearance: 64.9 mL/min (by C-G formula based on SCr of 0.55 mg/dL). Recent Labs  Lab 03/16/24 0729 03/17/24 0231 03/18/24 1624 03/19/24 0515  WBC 7.4 9.7 9.8 11.8*    Liver Function Tests: Recent Labs  Lab 03/16/24 0729 03/17/24 0231  ALBUMIN 3.6 3.7   No results for input(s): "LIPASE", "AMYLASE" in the last 168 hours. No results for input(s): "AMMONIA" in the last 168 hours.  ABG    Component Value Date/Time   PHART 7.321 (L) 11/07/2022 1228   PCO2ART 81.9 (HH) 11/07/2022 1228   PO2ART 69 (L) 11/07/2022 1228   HCO3 53.1 (H) 03/19/2024 0952   TCO2 >50 (H) 03/18/2024 1917   O2SAT 91.6 03/19/2024 0952     Coagulation Profile: No results for input(s): "INR", "PROTIME" in the last 168 hours.  Cardiac Enzymes: No results for input(s): "CKTOTAL", "CKMB", "CKMBINDEX", "TROPONINI" in the last 168  hours.  HbA1C: Hgb A1c MFr Bld  Date/Time Value Ref Range Status  03/11/2024 10:31 AM 7.4 (H) 4.6 - 6.5 % Final    Comment:    Glycemic Control Guidelines for People with Diabetes:Non Diabetic:  <6%Goal of Therapy: <7%Additional Action Suggested:  >8%   08/29/2023 11:48 AM 7.7 (H) 4.6 - 6.5 % Final    Comment:    Glycemic Control Guidelines for People with Diabetes:Non Diabetic:  <6%Goal of Therapy: <7%Additional Action Suggested:  >8%     CBG: Recent Labs  Lab 03/17/24 0624 03/17/24 0839 03/18/24 2304 03/19/24 0601 03/19/24 1045  GLUCAP 176* 237* 144* 144* 200*    Review of Systems:  Constitutional: negative for anorexia, fevers and sweats  Eyes: negative for irritation, redness and visual disturbance  Ears, nose, mouth, throat, and face: negative for earaches, epistaxis, nasal congestion and sore throat  Respiratory: + for cough, dyspnea on exertion, sputum and wheezing  Cardiovascular: negative for chest pain, dyspnea, lower extremity edema, orthopnea, palpitations and syncope  Gastrointestinal: negative for abdominal pain, constipation, diarrhea, melena, nausea and vomiting  Genitourinary:negative for dysuria, frequency and hematuria  Hematologic/lymphatic: negative for bleeding, easy bruising and lymphadenopathy  Musculoskeletal:negative for arthralgias, muscle weakness and stiff joints  Neurological: negative for coordination problems, gait problems, headaches and weakness  Endocrine: negative for diabetic symptoms including polydipsia, polyuria and weight loss   Past Medical History:  She,  has a past medical history of Bronchitis, CAD (coronary artery disease), CHF (congestive heart failure) (HCC), COPD (chronic obstructive pulmonary disease) (HCC), Diabetes (HCC), Headache, Heart disease, Hyperlipidemia, Hypertension, Hypertensive emergency (07/01/2013), Impaired glucose tolerance (08/20/2014), and Sinusitis.   Surgical History:   Past Surgical History:   Procedure Laterality Date   CORONARY ARTERY BYPASS GRAFT  2003   Triple bypass   ESOPHAGOGASTRODUODENOSCOPY Left 06/13/2014   Procedure: ESOPHAGOGASTRODUODENOSCOPY (EGD);  Surgeon: Tami Falcon, MD;  Location[facility] ENDOSCOPY;  Service: Endoscopy;  Laterality: Left;   RIGID ESOPHAGOSCOPY N/A 06/12/2014   Procedure: RIGID ESOPHAGOSCOPY WITH FOREIGN BODY REMOVAL;  Surgeon: Lawence Press, MD;  Location: [facility] OR;  Service: ENT;  Laterality: N/A;     Social History:   reports that she quit smoking about 4 years ago. Her smoking use included cigarettes. She started smoking about 52 years ago. She has a 23.7 pack-year smoking history. She has never used smokeless tobacco. She reports current alcohol use of about 5.0 - 6.0 standard drinks of alcohol per week. She reports that she does not currently use drugs after having used the following drugs: Marijuana.   Family History:  Her family history includes Brain cancer in her mother; Cancer in her maternal grandmother and another family member; Heart disease in her mother; Lung cancer in her maternal aunt and paternal uncle; Stroke in an other family member; Suicidality in her brother.   Allergies Allergies  Allergen Reactions   Chantix  [Varenicline ] Nausea And Vomiting   Augmentin  [Amoxicillin -Pot Clavulanate] Nausea And Vomiting     Home Medications  Prior to Admission medications   Medication Sig Start Date End Date Taking? Authorizing Provider  acetaminophen  (TYLENOL ) 500 MG tablet Take 1,000 mg by mouth 2 (two) times daily as needed for headache, fever, moderate pain or mild pain.   Yes [provider]  albuterol  (VENTOLIN  HFA) 108 (90 Base) MCG/ACT inhaler INHALE 1-2 PUFFS BY MOUTH EVERY 6 HOURS AS NEEDED FOR WHEEZE OR SHORTNESS OF BREATH 09/26/23  Yes Lind Repine, MD  alendronate  (FOSAMAX ) 70 MG tablet Take 70 mg by mouth once a week. 12/04/23  Yes [provider]  amLODipine  (NORVASC ) 5 MG tablet TAKE 1 TABLET (5 MG TOTAL) BY MOUTH  DAILY. 06/02/23  Yes Hugh Madura, MD  atorvastatin  (LIPITOR) 40 MG tablet Take 1 tablet (40 mg total) by mouth daily. 04/11/23  Yes Roslyn Coombe, MD  bisoprolol  (ZEBETA ) 5 MG tablet TAKE 1 TABLET (5 MG TOTAL) BY MOUTH DAILY. 09/05/23  Yes Hugh Madura, MD  Cholecalciferol (VITAMIN D -3 PO) Take 1 capsule by mouth in the morning.   Yes [provider]  Cyanocobalamin  (VITAMIN B-12 PO) Take 1 tablet by mouth in the morning.   Yes [provider]  Dulaglutide  (  TRULICITY ) 3 MG/0.5ML SOAJ Inject 3 mg as directed once a week. Patient taking differently: Inject 1.5 mg as directed once a week. 03/11/24  Yes Roslyn Coombe, MD  Dupilumab  (DUPIXENT ) 300 MG/2ML SOAJ Inject 300 mg into the skin every 14 (fourteen) days. 11/19/23  Yes Lind Repine, MD  fluticasone  (FLONASE ) 50 MCG/ACT nasal spray Place 1 spray into both nostrils daily. 10/22/23  Yes Vernestine Gondola, PA-C  Fluticasone -Umeclidin-Vilant (TRELEGY ELLIPTA ) 200-62.5-25 MCG/ACT AEPB INHALE 1 PUFF BY MOUTH EVERY DAY 07/05/23  Yes Lind Repine, MD  furosemide  (LASIX ) 20 MG tablet Take 20 mg by mouth 3 (three) times a week. Take on Mon-Wed-Fri   Yes [provider]  ipratropium-albuterol  (DUONEB) 0.5-2.5 (3) MG/3ML SOLN INHALE 3 ML BY NEBULIZER EVERY 6 HOURS AS NEEDED 09/19/23  Yes Lind Repine, MD  isosorbide  mononitrate (IMDUR ) 60 MG 24 hr tablet TAKE 1.5 TABLETS (90 MG TOTAL) BY MOUTH DAILY. 07/07/23  Yes Hugh Madura, MD  KLOR-CON  M10 10 MEQ tablet TAKE 1 TABLET BY MOUTH EVERY DAY 02/04/24  Yes Roslyn Coombe, MD  levofloxacin  (LEVAQUIN ) 500 MG tablet Take 1 tablet (500 mg total) by mouth daily. 03/12/24  Yes Roslyn Coombe, MD  MAGNESIUM  OXIDE PO Take 1 tablet by mouth at bedtime.   Yes [provider]  metFORMIN  (GLUCOPHAGE -XR) 500 MG 24 hr tablet TAKE 2 TABLET BY MOUTH EVERY DAY WITH BREAKFAST 08/31/23  Yes Roslyn Coombe, MD  Multiple Vitamins-Minerals (MULTIVITAMIN WOMEN 50+) TABS Take 1 tablet by mouth in the  morning.   Yes [provider]  OXYGEN  Inhale 3 L into the lungs continuous.   Yes [provider]  predniSONE  (DELTASONE ) 10 MG tablet 3 tabs by mouth per day for 3 days,2tabs per day for 3 days,1tab per day for 3 days 03/17/24  Yes Theadore Finger, MD      Celene Coins MD. FCCP. [place] Pager : [phone]  If no response to pager , please call [phone] until 7 pm After 7:00 pm call Elink  [phone]   03/19/2024

## 2024-03-19 NOTE — TOC CM/SW Note (Signed)
 Patient requesting RNCM return at another time to arrange home health.

## 2024-03-19 NOTE — Progress Notes (Signed)
 OT Cancellation Note  Patient Details Name: [name] MRN: [id number] DOB: Aug 09, 1956   Cancelled Treatment:    Reason Eval/Treat Not Completed: Medical issues which prohibited therapy (Per RN, pt currently on BiPAP with RN requesting OT reattempt later in the day. OT to reattempt to see pt for OT eval at a later time as appropriate/available.)  Mckaylee Dimalanta "Jenine Mix" M., OTR/L, MA Acute Rehab [phone]  Walt Gunner 03/19/2024, 11:03 AM

## 2024-03-19 NOTE — Plan of Care (Signed)

## 2024-03-20 DIAGNOSIS — J9621 Acute and chronic respiratory failure with hypoxia: Secondary | ICD-10-CM | POA: Diagnosis not present

## 2024-03-20 LAB — BASIC METABOLIC PANEL WITH GFR
Anion gap: 10 (ref 5–15)
BUN: 22 mg/dL (ref 8–23)
CO2: 44 mmol/L — ABNORMAL HIGH (ref 22–32)
Calcium: 9.4 mg/dL (ref 8.9–10.3)
Chloride: 87 mmol/L — ABNORMAL LOW (ref 98–111)
Creatinine, Ser: 0.74 mg/dL (ref 0.44–1.00)
GFR, Estimated: >60 mL/min (ref >60)
Glucose, Bld: 187 mg/dL — ABNORMAL HIGH (ref 70–99)
Potassium: 4.6 mmol/L (ref 3.5–5.1)
Sodium: 141 mmol/L (ref 135–145)

## 2024-03-20 LAB — GLUCOSE, CAPILLARY
Glucose-Capillary: 211 mg/dL — ABNORMAL HIGH (ref 70–99)
Glucose-Capillary: 302 mg/dL — ABNORMAL HIGH (ref 70–99)
Glucose-Capillary: 259 mg/dL — ABNORMAL HIGH (ref 70–99)
Glucose-Capillary: 340 mg/dL — ABNORMAL HIGH (ref 70–99)

## 2024-03-20 LAB — CBC
HCT: 46.1 % — ABNORMAL HIGH (ref 36.0–46.0)
Hemoglobin: 14.3 g/dL (ref 12.0–15.0)
MCH: 27.8 pg (ref 26.0–34.0)
MCHC: 31 g/dL (ref 30.0–36.0)
MCV: 89.7 fL (ref 80.0–100.0)
Platelets: 224 10*3/uL (ref 150–400)
RBC: 5.14 MIL/uL — ABNORMAL HIGH (ref 3.87–5.11)
RDW: 13 % (ref 11.5–15.5)
WBC: 8 10*3/uL (ref 4.0–10.5)
nRBC: 0 % (ref 0.0–0.2)

## 2024-03-20 LAB — MAGNESIUM: Magnesium: 2.6 mg/dL — ABNORMAL HIGH (ref 1.7–2.4)

## 2024-03-20 MED ORDER — ALUM & MAG HYDROXIDE-SIMETH 200-200-20 MG/5ML PO SUSP: 30.0000 mL | Freq: Four times a day (QID) | ORAL | Status: DC | PRN

## 2024-03-20 NOTE — Progress Notes (Signed)
 Mobility Specialist Progress Note:   03/20/24 1000  Oxygen  Therapy  O2 Device Nasal Cannula  O2 Flow Rate (L/min) 3 L/min  Mobility  Activity Ambulated with assistance in room  Level of Assistance Contact guard assist, steadying assist  Assistive Device Front wheel walker  Distance Ambulated (ft) 30 ft  Activity Response Tolerated well  Mobility Referral Yes  Mobility visit 1 Mobility  Mobility Specialist Start Time (ACUTE ONLY) [id number]  Mobility Specialist Stop Time (ACUTE ONLY) [id number]  Mobility Specialist Time Calculation (min) (ACUTE ONLY) 12 min    Pre Mobility: 91 HR,   95% SpO2 During Mobility: 99 HR,   95% SpO2 Post Mobility:  93 HR, 94% SpO2  Pt received in chair and agreeable. C/o some anxiety and SOB. VSS throughout on 3L. Pt left in chair with call bell and all needs met.  D'Vante Nolon Baxter Mobility Specialist Please contact via Special educational needs teacher or Rehab office at [phone]

## 2024-03-20 NOTE — Plan of Care (Signed)
  Problem: Education: Goal: Individualized Educational Video(s) Outcome: Progressing   Problem: Coping: Goal: Ability to adjust to condition or change in health will improve Outcome: Progressing   Problem: Fluid Volume: Goal: Ability to maintain a balanced intake and output will improve Outcome: Progressing

## 2024-03-20 NOTE — Plan of Care (Signed)
     Referral received for [name]: goals of care discussion. Chart reviewed and updates received from attending provider. Patient would like to meet when her family is available to participate. She is unsure what time they are visiting tomorrow. She will discuss potential meeting times with her family tonight. I will follow up in the morning to schedule goals of care meeting time.   Detailed note and recommendations to follow once GOC has been completed.   Thank you for your referral and allowing PMT to assist in Avion Patella Parkison's care.   Joaquim Muir, NP Palliative Medicine Team  Team Phone # [phone]   NO CHARGE

## 2024-03-20 NOTE — Progress Notes (Signed)
 PROGRESS NOTE  [name] ZOX:[id number] DOB: Sep 27, 1956 DOA: 03/18/2024 PCP: Roslyn Coombe, MD   LOS: 2 days   Brief narrative:  [name] is a 68 y.o. female with medical history significant for chronic HFpEF, COPD, chronic hypoxic respiratory failure on 3 L [place] at baseline, OSA, chronic hypercarbia, possible obesity hypoventilation syndrome on BiPAP nightly, who was recently admitted for acute COPD exacerbation and discharged 1 day prior to presentation presented to hospital with shortness of breath, orthopnea wheezing and hypoxia on her baseline 3 L of oxygen .  In the ED, patient was tachycardic, pulse ox was mid 80s on 3 L nasal cannula.  Her O2 supplementation was turned up to 5-6 L with improvement of her O2 saturation to the low 90s.  Conversational dyspnea was noted on exam.  Chest x-ray revealed cardiomegaly with mild pulmonary congestion.   CBC and BMP unremarkable.  BNP of 64.  VBG was concerning for compensatory respiratory acidosis with pCO2 of 90.9 and pH of 7.337.  Patient received IV Lasix  in the ED with some improvement in her symptoms.  Patient was then considered for admission to hospital for further evaluation and treatment.     Assessment/Plan: Principal Problem:   Acute on chronic hypoxic respiratory failure (HCC)   Acute on chronic hypoxic and hypercarbic respiratory failure likely secondary to pulmonary edema seen on chest x-ray, with concern for acute on chronic HFpEF At 3 L of oxygen  at baseline.  Uses BiPAP at nighttime for sleep apnea and CO2 retention.  Initially needed 5 to 6 L subsequently has been weaned down to 3 L.  Continue IV diuresis with IV Lasix .Aaron Aas  PCCM on board and patient has been switched to triple therapy neb with Solu-Medrol  from prednisone .  Continue NIV nocturnally and during the daytime.  Patient has been steroid-dependent.  Patient likely nearing end-stage COPD.  I again had some goals of care discussion with her.  She is willing to talk to  palliative care about what we can do for the future.  Patient states that she is not fully compliant with BiPAP at nighttime.   Acute on chronic HFpEF Last 2 D echo done on 05/29/2022 revealed LVEF 55 to 60% with grade 2 diastolic dysfunction. Continue strict intake and output charting Daily weights.  No overt peripheral edema.  On IV Lasix  20 mg twice daily.  Continue strict intake and output charting.  2D echocardiogram showed LV ejection fraction of 70 to 75% with LVH and grade 1 diastolic dysfunction.  OSA with suspected obesity hypoventilation syndrome, CO2 retention Compensatory chronic respiratory acidosis. BiPAP nightly and during naps. Last pCO2 90.9 with pH of 7.327 on VBG on presentation.  Currently alert awake and Communicative.   Class I obesity Body mass index is 33.78 kg/m. Patient would benefit from ongoing weight loss as outpatient.   Type 2 diabetes with hyperglycemia Last hemoglobin A1c 7.4 on 03/11/2024.  Continue diabetic diet sliding scale insulin  Accu-Cheks.   Non anion gap metabolic alkalosis Serum bicarb 38, likely compensatory from chronic respiratory acidosis   Generalized weakness Will get PT OT evaluation.  Goals of care. Discussed with the patient at length regarding end-stage COPD and recurrent admissions.  She is willing to discuss with palliative care at this time.  Will consult palliative care to address goals of care for end-stage COPD and recurrent admissions.   DVT prophylaxis: enoxaparin  (LOVENOX ) injection 40 mg Start: 03/19/24 1000   Disposition: Uncertain at this time, likely home with home health  in 1 to 2 days.  Status is: Inpatient Remains inpatient appropriate because: Pending clinical improvement,  recurrent admissions, high risk of readmissions, IV Solu-Medrol , goals of care discussion.    Code Status:     Code Status: Full Code  Family Communication: Spoke with the patient's sister at bedside on 03/19/2024.  Consultants: Pulmonary.    Procedures: BiPAP at nighttime.  Anti-infectives:  Levaquin   Anti-infectives (From admission, onward)    Start     Dose/Rate Route Frequency Ordered Stop   03/19/24 1000  levofloxacin  (LEVAQUIN ) tablet 500 mg        500 mg Oral Daily 03/18/24 2222          Subjective: Today, patient was seen and examined at bedside.  Patient states that she feels a little better today.  Sitting up in the bedside chair.  Denies any nausea vomiting fever chills headache.  Still complains of mild cough with clear phlegm and dyspnea on exertion.  Objective: Vitals:   03/20/24 0714 03/20/24 0849  BP: (!) 153/63   Pulse: 78 84  Resp: 14 18  Temp: 98.2 F (36.8 C)   SpO2: 96% 98%    Intake/Output Summary (Last 24 hours) at 03/20/2024 0908 Last data filed at 03/20/2024 0453 Gross per 24 hour  Intake --  Output 1000 ml  Net -1000 ml   Filed Weights   03/19/24 0012 03/19/24 0413 03/20/24 0350  Weight: 81.5 kg 81.1 kg 79.7 kg   Body mass index is 33.2 kg/m.   Physical Exam:  GENERAL: Patient is alert awake and oriented.  On 3 L of oxygen  by nasal cannula, obese built.  Sitting up in the chair.  In mild distress HENT: No scleral pallor or icterus. Pupils equally reactive to light. Oral mucosa is moist NECK: is supple, no gross swelling noted. CHEST:   Diminished breath sounds bilaterally.  Rhonchi noted. CVS: S1 and S2 heard, no murmur. Regular rate and rhythm.  ABDOMEN: Soft, non-tender, bowel sounds are present. EXTREMITIES: No obvious edema. CNS: Cranial nerves are intact. No focal motor deficits. SKIN: warm and dry without rashes.  Data Review: I have personally reviewed the following laboratory data and studies,  CBC: Recent Labs  Lab 03/16/24 0729 03/17/24 0231 03/18/24 1624 03/18/24 1917 03/19/24 0515 03/20/24 0237  WBC 7.4 9.7 9.8  --  11.8* 8.0  HGB 14.0 14.1 14.3 15.3* 14.4 14.3  HCT 46.0 47.1* 47.7* 45.0 47.6* 46.1*  MCV 92.4 92.0 92.8  --  91.7 89.7  PLT 172  177 216  --  215 224   Basic Metabolic Panel: Recent Labs  Lab 03/16/24 0729 03/17/24 0231 03/18/24 1624 03/18/24 1917 03/19/24 0515 03/20/24 0237  NA 138 141 140 140 143 141  K 5.2* 5.1 4.6 4.6 4.1 4.6  CL 89* 92* 91*  --  91* 87*  CO2 38* 36* 38*  --  42* 44*  GLUCOSE 174* 185* 206*  --  142* 187*  BUN 24* 26* 22  --  18 22  CREATININE 0.59 0.62 0.60  --  0.55 0.74  CALCIUM  9.5 9.5 9.5  --  9.6 9.4  MG 2.5* 2.5*  --   --  2.0 2.6*  PHOS 4.3 4.4  --   --  2.7  --    Liver Function Tests: Recent Labs  Lab 03/16/24 0729 03/17/24 0231  ALBUMIN 3.6 3.7   No results for input(s): "LIPASE", "AMYLASE" in the last 168 hours. No results for input(s): "AMMONIA" in the last  168 hours. Cardiac Enzymes: No results for input(s): "CKTOTAL", "CKMB", "CKMBINDEX", "TROPONINI" in the last 168 hours. BNP (last 3 results) Recent Labs    12/07/23 2228 03/15/24 0419 03/18/24 1624  BNP 133.0* 53.2 64.3    ProBNP (last 3 results) No results for input(s): "PROBNP" in the last 8760 hours.  CBG: Recent Labs  Lab 03/19/24 0601 03/19/24 1045 03/19/24 1604 03/19/24 2105 03/20/24 0605  GLUCAP 144* 200* 271* 222* 211*   Recent Results (from the past 240 hours)  Resp panel by RT-PCR (RSV, Flu A&B, Covid) Anterior Nasal Swab     Status: None   Collection Time: 03/15/24  4:59 AM   Specimen: Anterior Nasal Swab  Result Value Ref Range Status   SARS Coronavirus 2 by RT PCR NEGATIVE NEGATIVE Final   Influenza A by PCR NEGATIVE NEGATIVE Final   Influenza B by PCR NEGATIVE NEGATIVE Final    Comment: (NOTE) The Xpert Xpress SARS-CoV-2/FLU/RSV plus assay is intended as an aid in the diagnosis of influenza from Nasopharyngeal swab specimens and should not be used as a sole basis for treatment. Nasal washings and aspirates are unacceptable for Xpert Xpress SARS-CoV-2/FLU/RSV testing.  Fact Sheet for Patients: BloggerCourse.com  Fact Sheet for Healthcare  Providers: SeriousBroker.it  This test is not yet approved or cleared by the [place]  FDA and has been authorized for detection and/or diagnosis of SARS-CoV-2 by FDA under an Emergency Use Authorization (EUA). This EUA will remain in effect (meaning this test can be used) for the duration of the COVID-19 declaration under Section 564(b)(1) of the Act, 21 U.S.C. section 360bbb-3(b)(1), unless the authorization is terminated or revoked.     Resp Syncytial Virus by PCR NEGATIVE NEGATIVE Final    Comment: (NOTE) Fact Sheet for Patients: BloggerCourse.com  Fact Sheet for Healthcare Providers: SeriousBroker.it  This test is not yet approved or cleared by the [place]  FDA and has been authorized for detection and/or diagnosis of SARS-CoV-2 by FDA under an Emergency Use Authorization (EUA). This EUA will remain in effect (meaning this test can be used) for the duration of the COVID-19 declaration under Section 564(b)(1) of the Act, 21 U.S.C. section 360bbb-3(b)(1), unless the authorization is terminated or revoked.  Performed at [facility], [address], [place], [place] [address]   Respiratory (~20 pathogens) panel by PCR     Status: None   Collection Time: 03/15/24  4:59 AM   Specimen: Nasopharyngeal Swab; Respiratory  Result Value Ref Range Status   Adenovirus NOT DETECTED NOT DETECTED Final   Coronavirus 229E NOT DETECTED NOT DETECTED Final    Comment: (NOTE) The Coronavirus on the Respiratory Panel, DOES NOT test for the novel  Coronavirus (2019 nCoV)    Coronavirus HKU1 NOT DETECTED NOT DETECTED Final   Coronavirus NL63 NOT DETECTED NOT DETECTED Final   Coronavirus OC43 NOT DETECTED NOT DETECTED Final   Metapneumovirus NOT DETECTED NOT DETECTED Final   Rhinovirus / Enterovirus NOT DETECTED NOT DETECTED Final   Influenza A NOT DETECTED NOT DETECTED Final   Influenza B NOT DETECTED  NOT DETECTED Final   Parainfluenza Virus 1 NOT DETECTED NOT DETECTED Final   Parainfluenza Virus 2 NOT DETECTED NOT DETECTED Final   Parainfluenza Virus 3 NOT DETECTED NOT DETECTED Final   Parainfluenza Virus 4 NOT DETECTED NOT DETECTED Final   Respiratory Syncytial Virus NOT DETECTED NOT DETECTED Final   Bordetella pertussis NOT DETECTED NOT DETECTED Final   Bordetella Parapertussis NOT DETECTED NOT DETECTED Final  Chlamydophila pneumoniae NOT DETECTED NOT DETECTED Final   Mycoplasma pneumoniae NOT DETECTED NOT DETECTED Final    Comment: Performed at [facility], [address], [place], [place] [address]  MRSA Next Gen by PCR, Nasal     Status: None   Collection Time: 03/19/24 12:16 AM   Specimen: Nasal Mucosa; Nasal Swab  Result Value Ref Range Status   MRSA by PCR Next Gen NOT DETECTED NOT DETECTED Final    Comment: (NOTE) The GeneXpert MRSA Assay (FDA approved for NASAL specimens only), is one component of a comprehensive MRSA colonization surveillance program. It is not intended to diagnose MRSA infection nor to guide or monitor treatment for MRSA infections. Test performance is not FDA approved in patients less than 67 years old. Performed at [facility], [address], [place], [place] [address]      Studies: ECHOCARDIOGRAM COMPLETE Result Date: 03/19/2024    ECHOCARDIOGRAM REPORT   Patient Name:   KALECIA HARTNEY Date of Exam: 03/19/2024 Medical Rec #:  [id number]        Height:       61.0 in Accession #:    [id number]       Weight:       178.8 lb Date of Birth:  10-Jun-1956         BSA:          1.801 m Patient Age:    68 years         BP:           157/77 mmHg Patient Gender: F                HR:           96 bpm. Exam Location:  Inpatient Procedure: 2D Echo, Cardiac Doppler and Color Doppler (Both Spectral and Color            Flow Doppler were utilized during procedure). Indications:    Elevated Troponin  History:        Patient has prior history of  Echocardiogram examinations, most                 recent 05/29/2022. CHF, CAD, COPD; Risk Factors:Hypertension and                 Diabetes.  Sonographer:    Astrid Blamer Referring Phys: CAROLE N HALL IMPRESSIONS  1. Left ventricular ejection fraction, by estimation, is 70 to 75%. The left ventricle has hyperdynamic function. The left ventricle has no regional wall motion abnormalities. There is moderate concentric left ventricular hypertrophy. Left ventricular diastolic parameters are consistent with Grade I diastolic dysfunction (impaired relaxation).  2. Right ventricular systolic function is normal. The right ventricular size is normal.  3. There is no evidence of cardiac tamponade.  4. The mitral valve is normal in structure. Trivial mitral valve regurgitation. No evidence of mitral stenosis.  5. The aortic valve is tricuspid. There is mild calcification of the aortic valve. Aortic valve regurgitation is not visualized. Aortic valve sclerosis/calcification is present, without any evidence of aortic stenosis.  6. The inferior vena cava is dilated in size with >50% respiratory variability, suggesting right atrial pressure of 8 mmHg. FINDINGS  Left Ventricle: Left ventricular ejection fraction, by estimation, is 70 to 75%. The left ventricle has hyperdynamic function. The left ventricle has no regional wall motion abnormalities. The left ventricular internal cavity size was normal in size. There is moderate concentric left ventricular hypertrophy. Left  ventricular diastolic parameters are consistent with Grade I diastolic dysfunction (impaired relaxation). Right Ventricle: The right ventricular size is normal. No increase in right ventricular wall thickness. Right ventricular systolic function is normal. Left Atrium: Left atrial size was normal in size. Right Atrium: Right atrial size was normal in size. Pericardium: Trivial pericardial effusion is present. The pericardial effusion is anterior to the right  ventricle. There is no evidence of cardiac tamponade. Mitral Valve: The mitral valve is normal in structure. Trivial mitral valve regurgitation. No evidence of mitral valve stenosis. Tricuspid Valve: The tricuspid valve is normal in structure. Tricuspid valve regurgitation is trivial. No evidence of tricuspid stenosis. Aortic Valve: The aortic valve is tricuspid. There is mild calcification of the aortic valve. Aortic valve regurgitation is not visualized. Aortic valve sclerosis/calcification is present, without any evidence of aortic stenosis. Aortic valve mean gradient measures 4.0 mmHg. Aortic valve peak gradient measures 10.4 mmHg. Aortic valve area, by VTI measures 2.80 cm. Pulmonic Valve: The pulmonic valve was normal in structure. Pulmonic valve regurgitation is not visualized. No evidence of pulmonic stenosis. Aorta: The aortic root is normal in size and structure. Venous: The inferior vena cava is dilated in size with greater than 50% respiratory variability, suggesting right atrial pressure of 8 mmHg. IAS/Shunts: No atrial level shunt detected by color flow Doppler.  LEFT VENTRICLE PLAX 2D LVIDd:         3.90 cm   Diastology LVIDs:         2.40 cm   LV e' medial:    9.68 cm/s LV PW:         0.90 cm   LV E/e' medial:  9.4 LV IVS:        0.80 cm   LV e' lateral:   11.00 cm/s LVOT diam:     1.80 cm   LV E/e' lateral: 8.2 LV SV:         55 LV SV Index:   31 LVOT Area:     2.54 cm  RIGHT VENTRICLE             IVC RV S prime:     11.00 cm/s  IVC diam: 2.20 cm LEFT ATRIUM             Index        RIGHT ATRIUM          Index LA Vol (A2C):   22.8 ml 12.66 ml/m  RA Area:     8.80 cm LA Vol (A4C):   25.4 ml 14.10 ml/m  RA Volume:   15.00 ml 8.33 ml/m LA Biplane Vol: 26.2 ml 14.55 ml/m  AORTIC VALVE AV Area (Vmax):    2.26 cm AV Area (Vmean):   2.52 cm AV Area (VTI):     2.80 cm AV Vmax:           161.00 cm/s AV Vmean:          89.800 cm/s AV VTI:            0.198 m AV Peak Grad:      10.4 mmHg AV Mean Grad:       4.0 mmHg LVOT Vmax:         143.00 cm/s LVOT Vmean:        89.000 cm/s LVOT VTI:          0.218 m LVOT/AV VTI ratio: 1.10  AORTA Ao Root diam: 2.10 cm MITRAL VALVE MV Area (PHT): 3.93 cm  SHUNTS MV Decel Time: 193 msec    Systemic VTI:  0.22 m MV E velocity: 90.70 cm/s  Systemic Diam: 1.80 cm MV A velocity: 96.40 cm/s MV E/A ratio:  0.94 Jules Oar MD Electronically signed by Jules Oar MD Signature Date/Time: 03/19/2024/5:59:29 PM    Final    DG Chest 2 View Result Date: 03/18/2024 CLINICAL DATA:  Shortness of breath. EXAM: CHEST - 2 VIEW COMPARISON:  Chest radiograph dated 03/15/2024. FINDINGS: Mild cardiomegaly with mild central vascular congestion. No focal consolidation, pleural effusion pneumothorax. Median sternotomy wires and CABG vascular clips. No acute osseous pathology. IMPRESSION: Mild cardiomegaly with mild central vascular congestion. Electronically Signed   By: Angus Bark M.D.   On: 03/18/2024 17:32      Rosena Conradi, MD  [facility] 03/20/2024  If 7PM-7AM, please contact night-coverage

## 2024-03-20 NOTE — Plan of Care (Signed)

## 2024-03-21 ENCOUNTER — Encounter (HOSPITAL_BASED_OUTPATIENT_CLINIC_OR_DEPARTMENT_OTHER): Payer: Self-pay | Admitting: Pulmonary Disease

## 2024-03-21 DIAGNOSIS — Z515 Encounter for palliative care: Secondary | ICD-10-CM

## 2024-03-21 DIAGNOSIS — Z7189 Other specified counseling: Secondary | ICD-10-CM

## 2024-03-21 DIAGNOSIS — J9621 Acute and chronic respiratory failure with hypoxia: Secondary | ICD-10-CM | POA: Diagnosis not present

## 2024-03-21 LAB — MAGNESIUM: Magnesium: 2.5 mg/dL — ABNORMAL HIGH (ref 1.7–2.4)

## 2024-03-21 LAB — BASIC METABOLIC PANEL WITH GFR
Anion gap: 10 (ref 5–15)
BUN: 24 mg/dL — ABNORMAL HIGH (ref 8–23)
CO2: 41 mmol/L — ABNORMAL HIGH (ref 22–32)
Calcium: 9.3 mg/dL (ref 8.9–10.3)
Chloride: 88 mmol/L — ABNORMAL LOW (ref 98–111)
Creatinine, Ser: 0.73 mg/dL (ref 0.44–1.00)
GFR, Estimated: >60 mL/min (ref >60)
Glucose, Bld: 177 mg/dL — ABNORMAL HIGH (ref 70–99)
Potassium: 5 mmol/L (ref 3.5–5.1)
Sodium: 139 mmol/L (ref 135–145)

## 2024-03-21 LAB — GLUCOSE, CAPILLARY
Glucose-Capillary: 161 mg/dL — ABNORMAL HIGH (ref 70–99)
Glucose-Capillary: 187 mg/dL — ABNORMAL HIGH (ref 70–99)
Glucose-Capillary: 394 mg/dL — ABNORMAL HIGH (ref 70–99)
Glucose-Capillary: 299 mg/dL — ABNORMAL HIGH (ref 70–99)

## 2024-03-21 LAB — CBC
HCT: 45.4 % (ref 36.0–46.0)
Hemoglobin: 13.9 g/dL (ref 12.0–15.0)
MCH: 27.5 pg (ref 26.0–34.0)
MCHC: 30.6 g/dL (ref 30.0–36.0)
MCV: 89.7 fL (ref 80.0–100.0)
Platelets: 213 10*3/uL (ref 150–400)
RBC: 5.06 MIL/uL (ref 3.87–5.11)
RDW: 13 % (ref 11.5–15.5)
WBC: 10.9 10*3/uL — ABNORMAL HIGH (ref 4.0–10.5)
nRBC: 0 % (ref 0.0–0.2)

## 2024-03-21 MED ORDER — INSULIN ASPART 100 UNIT/ML IJ SOLN
0.0000 [IU] | Freq: Three times a day (TID) | INTRAMUSCULAR | Status: DC
  Administered 2024-03-22: 7 [IU] via SUBCUTANEOUS
  Administered 2024-03-22: 4 [IU] via SUBCUTANEOUS
  Administered 2024-03-22: 7 [IU] via SUBCUTANEOUS
  Administered 2024-03-23: 3 [IU] via SUBCUTANEOUS

## 2024-03-21 MED ORDER — INSULIN ASPART 100 UNIT/ML IJ SOLN
0.0000 [IU] | Freq: Every day | INTRAMUSCULAR | Status: DC
  Administered 2024-03-21: 3 [IU] via SUBCUTANEOUS

## 2024-03-21 NOTE — Consult Note (Signed)
 Palliative Care Consult Note                                  Date: 03/21/2024   Patient Name: [name]  DOB: February 08, 1956  MRN: [id number]  Age / Sex: 68 y.o., female  PCP: Roslyn Coombe, MD Referring Physician: Rosena Conradi, MD  Reason for Consultation: Establishing goals of care  HPI/Patient Profile: 68 y.o. female  with past medical history of chronic HFpEF, COPD, chronic hypoxic respiratory failure on 3 L [place] at baseline, OSA, chronic hypercarbia, BiPAP nightly, who was recently admitted for acute COPD exacerbation and discharged 1 day prior to presentation admitted on 03/18/2024 with shortness of breath, orthopnea, wheezing and hypoxia on her baseline oxygen .  Admitted for acute on chronic hypoxic respiratory failure.    Past Medical History:  Diagnosis Date   Bronchitis    CAD (coronary artery disease)    a. 02/2002: CABG x3 with LIMA to LAD, RIMA to RCA, and SVG to OM   CHF (congestive heart failure) (HCC)    COPD (chronic obstructive pulmonary disease) (HCC)    Diabetes (HCC)    Headache    Heart disease    Hyperlipidemia    Hypertension    Hypertensive emergency 07/01/2013   Impaired glucose tolerance 08/20/2014   Sinusitis     Subjective:   I have reviewed medical records including EPIC notes, labs and imaging, received update from attending provider, assessed the patient and then met with the patient and her sister  to discuss diagnosis prognosis, GOC, EOL wishes, disposition and options.  I introduced Palliative Medicine as specialized medical care for people living with serious illness. It focuses on providing relief from symptoms and stress of a serious illness. The goal is to improve quality of life for both the patient and the family.  Today's Discussion: Met with [name] and her sister [name] to discuss goals of care. We discussed the patient's chronic conditions and acute hospitalization. We discussed  the patient's rehospitalization after only one day. We discussed she is nearing end-stage COPD. This is a shock for her and her sister to hear. I reviewed my understanding of her COPD, including limitations of ongoing interventions and high risk for further decline despite aggressive treatment efforts.  The patient shared that she has been feeling good in general and that these exacerbations come on very quickly. The patient lives with her sister and completes all ADLs and IADLs independently.  She does own some assistive devices such as a wheelchair and shower chair that she uses as needed. The patient is not married but has two sons and seven grandchildren.The patient is retired from working in [employer].  In her retirement spending time with family at home has provided her immense joy. [name] shared that she would be okay with dying except she loves and wants to spend more time with family.  We discussed advanced directives. [name] has completed healthcare power of attorney documentation naming her sister [name] as her proxy Education officer, environmental. She has also completed a living will.  I requested both of these from [name]. We discussed code status. Recommended consideration of DNR  status, understanding evidenced-based poor outcomes in similar hospitalized patients, as the cause of the arrest is likely associated with chronic/terminal disease rather than a reversible acute cardio-pulmonary event. Patient does not want resuscitation-- changed to DNR. We discussed the difference between an aggressive medical intervention path and a comfort focused path. The patient shares she would like to continue to treat the treatable but would like to set a limitation in she would not want to be intubated-- changed to DNI. We discussed options moving forward including outpatient palliative care and hospice services. Patient would like outpatient palliative care follow-up to assist with symptom management. Patient asked about  morphine  use for symptom management as a previous provider had mentioned this. All questions answered. Patient will follow up with her pulmonologist and outpatient palliative care to discuss further.  Discussed the importance of continued conversation with family and the medical providers regarding overall plan of care and treatment options, ensuring decisions are within the context of the patient's values and GOCs.  Questions and concerns were addressed. Hard Choices booklet left for review. The family was encouraged to call with questions or concerns. PMT will continue to support holistically.  Review of Systems  Constitutional:  Positive for appetite change and fatigue.  Respiratory:  Positive for shortness of breath.     Objective:   Primary Diagnoses: Present on Admission:  Acute on chronic hypoxic respiratory failure [facility])   Physical Exam Vitals reviewed.  Constitutional:      General: She is not in acute distress.    Appearance: She is not ill-appearing.     Interventions: Nasal cannula in place.  HENT:     Head: Normocephalic and atraumatic.  Cardiovascular:     Rate and Rhythm: Normal rate.  Pulmonary:     Effort: Pulmonary effort is normal.  Skin:    General: Skin is warm and dry.  Neurological:     Mental Status: She is alert and oriented to person, place, and time.  Psychiatric:        Mood and Affect: Mood normal.        Behavior: Behavior normal.     Vital Signs:  BP (!) 145/56 (BP Location: Right Arm)   Pulse 68   Temp (!) 97.2 F (36.2 C) (Axillary)   Resp 18   Ht 5\' 1"  (1.549 m)   Wt 79.7 kg   SpO2 100%   BMI 33.20 kg/m     Advanced Care Planning:   Existing Vynca/ACP Documentation: None  Primary Decision Maker: PATIENT  Code Status/Advance Care Planning: DNR   Assessment & Plan:   SUMMARY OF RECOMMENDATIONS   Change to DNR/DNI Continue treating the treatable TOC referral for outpatient palliative care for symptom  management Spiritual care referral PMT to follow   Discussed with: bedside RN and Dr. Efrain Grant  Time Total: 90 minutes    Thank you for allowing us  to participate in the care of [name] PMT will continue to support holistically.   Signed by: Joaquim Muir, NP Palliative Medicine Team  Team Phone # [phone] (Nights/Weekends)  03/21/2024, 9:02 AM

## 2024-03-21 NOTE — Plan of Care (Signed)

## 2024-03-21 NOTE — Progress Notes (Signed)
 PROGRESS NOTE  [name] ZOX:[id number] DOB: 1956/03/21 DOA: 03/18/2024 PCP: Roslyn Coombe, MD   LOS: 3 days   Brief narrative:  [name] is a 68 y.o. female with medical history significant for chronic HFpEF, COPD, chronic hypoxic respiratory failure on 3 L [place] at baseline, OSA, chronic hypercarbia, possible obesity hypoventilation syndrome on BiPAP nightly, who was recently admitted for acute COPD exacerbation and discharged 1 day prior to presentation presented to hospital with shortness of breath, orthopnea wheezing and hypoxia on her baseline 3 L of oxygen .  In the ED, patient was tachycardic, pulse ox was mid 80s on 3 L nasal cannula.  Her O2 supplementation was turned up to 5-6 L with improvement of her O2 saturation to the low 90s.  Conversational dyspnea was noted on exam.  Chest x-ray revealed cardiomegaly with mild pulmonary congestion.   CBC and BMP unremarkable.  BNP of 64.  VBG was concerning for compensatory respiratory acidosis with pCO2 of 90.9 and pH of 7.337.  Patient received IV Lasix  in the ED with some improvement in her symptoms.  Patient was then considered for admission to hospital for further evaluation and treatment.     Assessment/Plan: Principal Problem:   Acute on chronic hypoxic respiratory failure (HCC)   Acute on chronic hypoxic and hypercarbic respiratory failure likely secondary acute on chronic HFpEF, severe COPD. At 3 L of oxygen  at baseline.  Uses BiPAP at nighttime for sleep apnea and CO2 retention.  Initially needed 5 to 6 L subsequently has been weaned down to 3 L.  Continue IV diuresis with IV Lasix ..  PCCM on board and patient has been switched to triple therapy neb with Solu-Medrol  from prednisone .  Continue NIV nocturnally and during the daytime.  Patient has been steroid-dependent.  Patient likely nearing end-stage COPD.  Palliative care on board at this time discussing about further goals of care.  Acute on chronic HFpEF Last 2 D echo  done on 05/29/2022 revealed LVEF 55 to 60% with grade 2 diastolic dysfunction. Continue strict intake and output charting Daily weights.  No overt peripheral edema.  On IV Lasix  40 daily.  Will change to oral by tomorrow.  Continue strict intake and output charting.  2D echocardiogram showed LV ejection fraction of 70 to 75% with LVH and grade 1 diastolic dysfunction.  Patient is negative balance for 1520 mL.  OSA with suspected obesity hypoventilation syndrome, CO2 retention Compensatory chronic respiratory acidosis. BiPAP nightly and during naps. Last pCO2 90.9 with pH of 7.327 on VBG on presentation.  Currently alert awake and Communicative.   Class I obesity Body mass index is 33.78 kg/m. Patient would benefit from ongoing weight loss as outpatient.   Type 2 diabetes with hyperglycemia Last hemoglobin A1c 7.4 on 03/11/2024.  Continue diabetic diet sliding scale insulin  Accu-Cheks.  Latest POC glucose of 161.   Non anion gap metabolic alkalosis Serum bicarb 41, likely compensatory from chronic respiratory acidosis   Generalized weakness PT OT evaluation recommend home health on discharge.  Goals of care. Discussed with the patient at length regarding end-stage COPD and recurrent admissions.  Currently palliative care on board regarding further goals of care.     DVT prophylaxis: enoxaparin  (LOVENOX ) injection 40 mg Start: 03/19/24 1000   Disposition: Uncertain at this time, likely home with home health in 1 to 2 days.  Status is: Inpatient Remains inpatient appropriate because: Pending clinical improvement,  recurrent admissions, high risk of readmissions, IV Solu-Medrol ,  Code Status:     Code Status: Limited: Do not attempt resuscitation (DNR) -DNR-LIMITED -Do Not Intubate/DNI   Family Communication: Spoke with the patient's sister at bedside on 03/19/2024.  Consultants: Pulmonary.   Procedures: BiPAP at nighttime.  Anti-infectives:  Levaquin   Anti-infectives (From  admission, onward)    Start     Dose/Rate Route Frequency Ordered Stop   03/19/24 1000  levofloxacin  (LEVAQUIN ) tablet 500 mg        500 mg Oral Daily 03/18/24 2222         Subjective: Today, patient was seen and examined at bedside.  Patient says that she feels a little better with breathing.  Denies any nausea vomiting fever chills or rigor.  Still has some cough and shortness of breath on exertion.  Denies any pain  Objective: Vitals:   03/21/24 0732 03/21/24 1019  BP: (!) 145/56 (!) 147/55  Pulse: 68   Resp: 18   Temp: (!) 97.2 F (36.2 C)   SpO2: 100%     Intake/Output Summary (Last 24 hours) at 03/21/2024 1110 Last data filed at 03/20/2024 2310 Gross per 24 hour  Intake 480 ml  Output 200 ml  Net 280 ml   Filed Weights   03/19/24 0012 03/19/24 0413 03/20/24 0350  Weight: 81.5 kg 81.1 kg 79.7 kg   Body mass index is 33.2 kg/m.   Physical Exam:  GENERAL: Patient is alert awake and oriented.  On 3 L of oxygen  by nasal cannula, obese built.  Sitting up in the chair.  Communicative able to complete full sentences. HENT: No scleral pallor or icterus. Pupils equally reactive to light. Oral mucosa is moist NECK: is supple, no gross swelling noted. CHEST:   Diminished breath sounds bilaterally.   CVS: S1 and S2 heard, no murmur. Regular rate and rhythm.  ABDOMEN: Soft, non-tender, bowel sounds are present. EXTREMITIES: No obvious edema. CNS: Cranial nerves are intact. No focal motor deficits. SKIN: warm and dry without rashes.  Data Review: I have personally reviewed the following laboratory data and studies,  CBC: Recent Labs  Lab 03/17/24 0231 03/18/24 1624 03/18/24 1917 03/19/24 0515 03/20/24 0237 03/21/24 0235  WBC 9.7 9.8  --  11.8* 8.0 10.9*  HGB 14.1 14.3 15.3* 14.4 14.3 13.9  HCT 47.1* 47.7* 45.0 47.6* 46.1* 45.4  MCV 92.0 92.8  --  91.7 89.7 89.7  PLT 177 216  --  215 224 213   Basic Metabolic Panel: Recent Labs  Lab 03/16/24 0729 03/17/24 0231  03/18/24 1624 03/18/24 1917 03/19/24 0515 03/20/24 0237 03/21/24 0235  NA 138 141 140 140 143 141 139  K 5.2* 5.1 4.6 4.6 4.1 4.6 5.0  CL 89* 92* 91*  --  91* 87* 88*  CO2 38* 36* 38*  --  42* 44* 41*  GLUCOSE 174* 185* 206*  --  142* 187* 177*  BUN 24* 26* 22  --  18 22 24*  CREATININE 0.59 0.62 0.60  --  0.55 0.74 0.73  CALCIUM  9.5 9.5 9.5  --  9.6 9.4 9.3  MG 2.5* 2.5*  --   --  2.0 2.6* 2.5*  PHOS 4.3 4.4  --   --  2.7  --   --    Liver Function Tests: Recent Labs  Lab 03/16/24 0729 03/17/24 0231  ALBUMIN 3.6 3.7   No results for input(s): "LIPASE", "AMYLASE" in the last 168 hours. No results for input(s): "AMMONIA" in the last 168 hours. Cardiac Enzymes: No results for input(s): "  CKTOTAL", "CKMB", "CKMBINDEX", "TROPONINI" in the last 168 hours. BNP (last 3 results) Recent Labs    12/07/23 2228 03/15/24 0419 03/18/24 1624  BNP 133.0* 53.2 64.3    ProBNP (last 3 results) No results for input(s): "PROBNP" in the last 8760 hours.  CBG: Recent Labs  Lab 03/20/24 0605 03/20/24 1056 03/20/24 1625 03/20/24 2108 03/21/24 0616  GLUCAP 211* 302* 259* 340* 161*   Recent Results (from the past 240 hours)  Resp panel by RT-PCR (RSV, Flu A&B, Covid) Anterior Nasal Swab     Status: None   Collection Time: 03/15/24  4:59 AM   Specimen: Anterior Nasal Swab  Result Value Ref Range Status   SARS Coronavirus 2 by RT PCR NEGATIVE NEGATIVE Final   Influenza A by PCR NEGATIVE NEGATIVE Final   Influenza B by PCR NEGATIVE NEGATIVE Final    Comment: (NOTE) The Xpert Xpress SARS-CoV-2/FLU/RSV plus assay is intended as an aid in the diagnosis of influenza from Nasopharyngeal swab specimens and should not be used as a sole basis for treatment. Nasal washings and aspirates are unacceptable for Xpert Xpress SARS-CoV-2/FLU/RSV testing.  Fact Sheet for Patients: BloggerCourse.com  Fact Sheet for Healthcare  Providers: SeriousBroker.it  This test is not yet approved or cleared by the [place]  FDA and has been authorized for detection and/or diagnosis of SARS-CoV-2 by FDA under an Emergency Use Authorization (EUA). This EUA will remain in effect (meaning this test can be used) for the duration of the COVID-19 declaration under Section 564(b)(1) of the Act, 21 U.S.C. section 360bbb-3(b)(1), unless the authorization is terminated or revoked.     Resp Syncytial Virus by PCR NEGATIVE NEGATIVE Final    Comment: (NOTE) Fact Sheet for Patients: BloggerCourse.com  Fact Sheet for Healthcare Providers: SeriousBroker.it  This test is not yet approved or cleared by the [place]  FDA and has been authorized for detection and/or diagnosis of SARS-CoV-2 by FDA under an Emergency Use Authorization (EUA). This EUA will remain in effect (meaning this test can be used) for the duration of the COVID-19 declaration under Section 564(b)(1) of the Act, 21 U.S.C. section 360bbb-3(b)(1), unless the authorization is terminated or revoked.  Performed at [facility], [address] [place], [place] [address]   Respiratory (~20 pathogens) panel by PCR     Status: None   Collection Time: 03/15/24  4:59 AM   Specimen: Nasopharyngeal Swab; Respiratory  Result Value Ref Range Status   Adenovirus NOT DETECTED NOT DETECTED Final   Coronavirus 229E NOT DETECTED NOT DETECTED Final    Comment: (NOTE) The Coronavirus on the Respiratory Panel, DOES NOT test for the novel  Coronavirus (2019 nCoV)    Coronavirus HKU1 NOT DETECTED NOT DETECTED Final   Coronavirus NL63 NOT DETECTED NOT DETECTED Final   Coronavirus OC43 NOT DETECTED NOT DETECTED Final   Metapneumovirus NOT DETECTED NOT DETECTED Final   Rhinovirus / Enterovirus NOT DETECTED NOT DETECTED Final   Influenza A NOT DETECTED NOT DETECTED Final   Influenza B NOT DETECTED  NOT DETECTED Final   Parainfluenza Virus 1 NOT DETECTED NOT DETECTED Final   Parainfluenza Virus 2 NOT DETECTED NOT DETECTED Final   Parainfluenza Virus 3 NOT DETECTED NOT DETECTED Final   Parainfluenza Virus 4 NOT DETECTED NOT DETECTED Final   Respiratory Syncytial Virus NOT DETECTED NOT DETECTED Final   Bordetella pertussis NOT DETECTED NOT DETECTED Final   Bordetella Parapertussis NOT DETECTED NOT DETECTED Final   Chlamydophila pneumoniae NOT DETECTED NOT DETECTED Final  Mycoplasma pneumoniae NOT DETECTED NOT DETECTED Final    Comment: Performed at [facility], [address] [place], [place] [address]  MRSA Next Gen by PCR, Nasal     Status: None   Collection Time: 03/19/24 12:16 AM   Specimen: Nasal Mucosa; Nasal Swab  Result Value Ref Range Status   MRSA by PCR Next Gen NOT DETECTED NOT DETECTED Final    Comment: (NOTE) The GeneXpert MRSA Assay (FDA approved for NASAL specimens only), is one component of a comprehensive MRSA colonization surveillance program. It is not intended to diagnose MRSA infection nor to guide or monitor treatment for MRSA infections. Test performance is not FDA approved in patients less than 61 years old. Performed at [facility], [address] [place], [place] [address]      Studies: ECHOCARDIOGRAM COMPLETE Result Date: 03/19/2024    ECHOCARDIOGRAM REPORT   Patient Name:   [name] Date of Exam: 03/19/2024 Medical Rec #:  [id number]        Height:       61.0 in Accession #:    [id number]       Weight:       178.8 lb Date of Birth:  04-16-1956         BSA:          1.801 m Patient Age:    68 years         BP:           157/77 mmHg Patient Gender: F                HR:           96 bpm. Exam Location:  Inpatient Procedure: 2D Echo, Cardiac Doppler and Color Doppler (Both Spectral and Color            Flow Doppler were utilized during procedure). Indications:    Elevated Troponin  History:        Patient has prior history of  Echocardiogram examinations, most                 recent 05/29/2022. CHF, CAD, COPD; Risk Factors:Hypertension and                 Diabetes.  Sonographer:    Astrid Blamer Referring Phys: CAROLE N HALL IMPRESSIONS  1. Left ventricular ejection fraction, by estimation, is 70 to 75%. The left ventricle has hyperdynamic function. The left ventricle has no regional wall motion abnormalities. There is moderate concentric left ventricular hypertrophy. Left ventricular diastolic parameters are consistent with Grade I diastolic dysfunction (impaired relaxation).  2. Right ventricular systolic function is normal. The right ventricular size is normal.  3. There is no evidence of cardiac tamponade.  4. The mitral valve is normal in structure. Trivial mitral valve regurgitation. No evidence of mitral stenosis.  5. The aortic valve is tricuspid. There is mild calcification of the aortic valve. Aortic valve regurgitation is not visualized. Aortic valve sclerosis/calcification is present, without any evidence of aortic stenosis.  6. The inferior vena cava is dilated in size with >50% respiratory variability, suggesting right atrial pressure of 8 mmHg. FINDINGS  Left Ventricle: Left ventricular ejection fraction, by estimation, is 70 to 75%. The left ventricle has hyperdynamic function. The left ventricle has no regional wall motion abnormalities. The left ventricular internal cavity size was normal in size. There is moderate concentric left ventricular hypertrophy. Left ventricular diastolic parameters are consistent with Grade I diastolic  dysfunction (impaired relaxation). Right Ventricle: The right ventricular size is normal. No increase in right ventricular wall thickness. Right ventricular systolic function is normal. Left Atrium: Left atrial size was normal in size. Right Atrium: Right atrial size was normal in size. Pericardium: Trivial pericardial effusion is present. The pericardial effusion is anterior to the right  ventricle. There is no evidence of cardiac tamponade. Mitral Valve: The mitral valve is normal in structure. Trivial mitral valve regurgitation. No evidence of mitral valve stenosis. Tricuspid Valve: The tricuspid valve is normal in structure. Tricuspid valve regurgitation is trivial. No evidence of tricuspid stenosis. Aortic Valve: The aortic valve is tricuspid. There is mild calcification of the aortic valve. Aortic valve regurgitation is not visualized. Aortic valve sclerosis/calcification is present, without any evidence of aortic stenosis. Aortic valve mean gradient measures 4.0 mmHg. Aortic valve peak gradient measures 10.4 mmHg. Aortic valve area, by VTI measures 2.80 cm. Pulmonic Valve: The pulmonic valve was normal in structure. Pulmonic valve regurgitation is not visualized. No evidence of pulmonic stenosis. Aorta: The aortic root is normal in size and structure. Venous: The inferior vena cava is dilated in size with greater than 50% respiratory variability, suggesting right atrial pressure of 8 mmHg. IAS/Shunts: No atrial level shunt detected by color flow Doppler.  LEFT VENTRICLE PLAX 2D LVIDd:         3.90 cm   Diastology LVIDs:         2.40 cm   LV e' medial:    9.68 cm/s LV PW:         0.90 cm   LV E/e' medial:  9.4 LV IVS:        0.80 cm   LV e' lateral:   11.00 cm/s LVOT diam:     1.80 cm   LV E/e' lateral: 8.2 LV SV:         55 LV SV Index:   31 LVOT Area:     2.54 cm  RIGHT VENTRICLE             IVC RV S prime:     11.00 cm/s  IVC diam: 2.20 cm LEFT ATRIUM             Index        RIGHT ATRIUM          Index LA Vol (A2C):   22.8 ml 12.66 ml/m  RA Area:     8.80 cm LA Vol (A4C):   25.4 ml 14.10 ml/m  RA Volume:   15.00 ml 8.33 ml/m LA Biplane Vol: 26.2 ml 14.55 ml/m  AORTIC VALVE AV Area (Vmax):    2.26 cm AV Area (Vmean):   2.52 cm AV Area (VTI):     2.80 cm AV Vmax:           161.00 cm/s AV Vmean:          89.800 cm/s AV VTI:            0.198 m AV Peak Grad:      10.4 mmHg AV Mean Grad:       4.0 mmHg LVOT Vmax:         143.00 cm/s LVOT Vmean:        89.000 cm/s LVOT VTI:          0.218 m LVOT/AV VTI ratio: 1.10  AORTA Ao Root diam: 2.10 cm MITRAL VALVE MV Area (PHT): 3.93 cm    SHUNTS MV Decel Time: 193 msec  Systemic VTI:  0.22 m MV E velocity: 90.70 cm/s  Systemic Diam: 1.80 cm MV A velocity: 96.40 cm/s MV E/A ratio:  0.94 Jules Oar MD Electronically signed by Jules Oar MD Signature Date/Time: 03/19/2024/5:59:29 PM    Final       Rosena Conradi, MD  [facility] 03/21/2024  If 7PM-7AM, please contact night-coverage

## 2024-03-22 DIAGNOSIS — J9621 Acute and chronic respiratory failure with hypoxia: Secondary | ICD-10-CM | POA: Diagnosis not present

## 2024-03-22 LAB — CBC
HCT: 44.9 % (ref 36.0–46.0)
Hemoglobin: 13.9 g/dL (ref 12.0–15.0)
MCH: 27.5 pg (ref 26.0–34.0)
MCHC: 31 g/dL (ref 30.0–36.0)
MCV: 88.7 fL (ref 80.0–100.0)
Platelets: 213 10*3/uL (ref 150–400)
RBC: 5.06 MIL/uL (ref 3.87–5.11)
RDW: 12.9 % (ref 11.5–15.5)
WBC: 11.5 10*3/uL — ABNORMAL HIGH (ref 4.0–10.5)
nRBC: 0 % (ref 0.0–0.2)

## 2024-03-22 LAB — BASIC METABOLIC PANEL WITH GFR
Anion gap: 11 (ref 5–15)
BUN: 25 mg/dL — ABNORMAL HIGH (ref 8–23)
CO2: 42 mmol/L — ABNORMAL HIGH (ref 22–32)
Calcium: 9.3 mg/dL (ref 8.9–10.3)
Chloride: 86 mmol/L — ABNORMAL LOW (ref 98–111)
Creatinine, Ser: 0.73 mg/dL (ref 0.44–1.00)
GFR, Estimated: >60 mL/min (ref >60)
Glucose, Bld: 208 mg/dL — ABNORMAL HIGH (ref 70–99)
Potassium: 4.3 mmol/L (ref 3.5–5.1)
Sodium: 139 mmol/L (ref 135–145)

## 2024-03-22 LAB — GLUCOSE, CAPILLARY
Glucose-Capillary: 177 mg/dL — ABNORMAL HIGH (ref 70–99)
Glucose-Capillary: 207 mg/dL — ABNORMAL HIGH (ref 70–99)
Glucose-Capillary: 219 mg/dL — ABNORMAL HIGH (ref 70–99)
Glucose-Capillary: 271 mg/dL — ABNORMAL HIGH (ref 70–99)
Glucose-Capillary: 433 mg/dL — ABNORMAL HIGH (ref 70–99)

## 2024-03-22 LAB — MAGNESIUM: Magnesium: 2.5 mg/dL — ABNORMAL HIGH (ref 1.7–2.4)

## 2024-03-22 MED ORDER — INSULIN ASPART 100 UNIT/ML IJ SOLN
8.0000 [IU] | Freq: Once | INTRAMUSCULAR | Status: AC
  Administered 2024-03-22: 8 [IU] via SUBCUTANEOUS

## 2024-03-22 MED ORDER — TORSEMIDE 20 MG PO TABS
20.0000 mg | ORAL_TABLET | Freq: Every day | ORAL | Status: DC
  Administered 2024-03-23: 20 mg via ORAL
  Filled 2024-03-22: qty 1

## 2024-03-22 MED ORDER — METHYLPREDNISOLONE SODIUM SUCC 125 MG IJ SOLR
80.0000 mg | INTRAMUSCULAR | Status: DC
  Administered 2024-03-22: 80 mg via INTRAVENOUS
  Filled 2024-03-22: qty 2

## 2024-03-22 NOTE — Progress Notes (Signed)
 This chaplain responded to PMT NP-Dawn consult for spiritual care. The Pt. shared she has been sleeping most of the day and accepted the invitation for a visit.   The chaplain listened reflectively as the Pt. talked about numerous trips to the hospital for COPD exacerbation. Each time she is admitted she has time to reflect on the importance of family and her faith. The chaplain understands both family and her faith offer a place of comfort. The Pt. shares with this admission she was offered "morphine " by the medical team and she declined, stating she is not ready. The Pt. asked for a return visit and appreciated this visit, stating "sometimes it gets lonely in the hospital".  Chaplain Kathleene Papas [phone]

## 2024-03-22 NOTE — Plan of Care (Signed)

## 2024-03-22 NOTE — Inpatient Diabetes Management (Signed)
 Inpatient Diabetes Program Recommendations  AACE/ADA: New Consensus Statement on Inpatient Glycemic Control (2015)  Target Ranges:  Prepandial:   less than 140 mg/dL      Peak postprandial:   less than 180 mg/dL (1-2 hours)      Critically ill patients:  140 - 180 mg/dL   Lab Results  Component Value Date   GLUCAP 177 (H) 03/22/2024   HGBA1C 7.4 (H) 03/11/2024    Latest Reference Range & Units 03/21/24 06:16 03/21/24 11:40 03/21/24 16:11 03/21/24 21:47 03/22/24 06:22  Glucose-Capillary 70 - 99 mg/dL 161 (H) 096 (H) 045 (H) 299 (H) 177 (H)  (H): Data is abnormally high  Diabetes history: DM2 Outpatient Diabetes medications: Trulicity  1.5 mg weekly, Metformin  1 gm daily Current orders for Inpatient glycemic control: Novolog  0-20 units tid, 0-5 units hs correction, Solumedrol 120 mg daily  Inpatient Diabetes Program Recommendations:   If patient discharges on steroids, please consider: Novolog  sensitive scale.  Thank you, Dalissa Lovin E. Jove Beyl, RN, MSN, CDCES  Diabetes Coordinator Inpatient Glycemic Control Team Team Pager [phone] (8am-5pm) 03/22/2024 10:23 AM

## 2024-03-22 NOTE — Progress Notes (Signed)
 Physical Therapy Treatment Patient Details Name: [name] MRN: [id number] DOB: 01/30/1956 Today's Date: 03/22/2024   History of Present Illness Pt is a 68 yo female who presented to [facility] ED on 03/18/24 due to recurrent SOB with orthopnea, wheezing, and hypoxia on baseline 3L continuous O2 through nasal cannula. Of note - recent admissions 03/15/24 to 03/17/24 and in 11/2023 due to chronic resp failure in setting of COPD exacerbation. PMH: chronic hypoxic resp failure, HFpEF, COPD, OSA on BiPAP nightly, chronic hypercarbia, DM, CAD, HTN, HLD    PT Comments  Pt up in chair on arrival, pleasant and agreeable to session with good progress towards acute goals. Session focused on gait with RW for support for increased activity tolerance and improved self pacing. Pt demonstrating transfers and gait with RW for support at grossly supervision level with assist to manage lines and O2. Pt requiring x3 standing recovery breaks throughout ambulation due to feeling SOB, SpO2 >94% throughout mobility on 3L O2. Educated pt on appropriate activity progression and importance of continued mobility with pt verbalizing understanding. Pt continues to benefit from skilled PT services to progress toward functional mobility goals.     If plan is discharge home, recommend the following: Assistance with cooking/housework;Assist for transportation;Help with stairs or ramp for entrance   Can travel by private vehicle        Equipment Recommendations  None recommended by PT (rollator to be delivered from prior admit)    Recommendations for Other Services       Precautions / Restrictions Precautions Precautions: Fall Precaution/Restrictions Comments: watch O2 Restrictions Weight Bearing Restrictions Per Provider Order: No     Mobility  Bed Mobility Overal bed mobility: Needs Assistance Bed Mobility: Sit to Supine       Sit to supine: Supervision   General bed mobility comments: in recliner upon arrival, able  to retrun to supine at end of session with out assist    Transfers Overall transfer level: Needs assistance Equipment used: None Transfers: Sit to/from Stand, Bed to chair/wheelchair/BSC Sit to Stand: Supervision   Step pivot transfers: Supervision       General transfer comment: Supervision for safety, able to stand from chair and BSC without RW support    Ambulation/Gait Ambulation/Gait assistance: Supervision Gait Distance (Feet): 180 Feet Assistive device: Rolling walker (2 wheels) Gait Pattern/deviations: Step-through pattern, Decreased step length - right, Decreased stride length, Decreased step length - left Gait velocity: decreased     General Gait Details: x3 standing rest break due to feeling SOB, SpO2 >94% on 3L   Stairs             Wheelchair Mobility     Tilt Bed    Modified Rankin (Stroke Patients Only)       Balance Overall balance assessment: Mild deficits observed, not formally tested                                          Communication Communication Communication: No apparent difficulties  Cognition Arousal: Alert Behavior During Therapy: WFL for tasks assessed/performed, Anxious   PT - Cognitive impairments: No apparent impairments                         Following commands: Intact      Cueing Cueing Techniques: Verbal cues  Exercises      General Comments  Pertinent Vitals/Pain Pain Assessment Pain Assessment: No/denies pain    Home Living                          Prior Function            PT Goals (current goals can now be found in the care plan section) Acute Rehab PT Goals Patient Stated Goal: to get better PT Goal Formulation: With patient/family Time For Goal Achievement: 04/02/24 Progress towards PT goals: Progressing toward goals    Frequency    Min 2X/week      PT Plan      Co-evaluation              AM-PAC PT "6 Clicks" Mobility    Outcome Measure  Help needed turning from your back to your side while in a flat bed without using bedrails?: None Help needed moving from lying on your back to sitting on the side of a flat bed without using bedrails?: None Help needed moving to and from a bed to a chair (including a wheelchair)?: A Little Help needed standing up from a chair using your arms (e.g., wheelchair or bedside chair)?: A Little Help needed to walk in hospital room?: A Little Help needed climbing 3-5 steps with a railing? : A Lot 6 Click Score: 19    End of Session Equipment Utilized During Treatment: Oxygen  Activity Tolerance: Patient tolerated treatment well Patient left: with call bell/phone within reach;in bed Nurse Communication: Mobility status PT Visit Diagnosis: Muscle weakness (generalized) (M62.81)     Time: 1340-1405 PT Time Calculation (min) (ACUTE ONLY): 25 min  Charges:    $Gait Training: 23-37 mins PT General Charges $$ ACUTE PT VISIT: 1 Visit                     Antony Baumgartner R. PTA Acute Rehabilitation Services Office: [phone]   Agapito Horseman 03/22/2024, 3:35 PM

## 2024-03-22 NOTE — Progress Notes (Signed)
 Daily Progress Note   Patient Name: [name]       Date: 03/22/2024 DOB: [birth year]  Age: 68 y.o. MRN#: [id number] Attending Physician: Rosena Conradi, MD Primary Care Physician: Roslyn Coombe, MD Admit Date: 03/18/2024  Reason for Consultation/Follow-up: Establishing goals of care  Length of Stay: 4  Current Medications: Scheduled Meds:   amLODipine   5 mg Oral Daily   arformoterol   15 mcg Nebulization BID   atorvastatin   40 mg Oral Daily   bisoprolol   5 mg Oral Daily   budesonide  (PULMICORT ) nebulizer solution  0.5 mg Nebulization BID   cholecalciferol   1,000 Units Oral q AM   vitamin B-12  500 mcg Oral q AM   enoxaparin  (LOVENOX ) injection  40 mg Subcutaneous Q24H   fluticasone   1 spray Each Nare Daily   insulin  aspart  0-20 Units Subcutaneous TID WC   insulin  aspart  0-5 Units Subcutaneous QHS   isosorbide  mononitrate  90 mg Oral Daily   levofloxacin   500 mg Oral Daily   magnesium  oxide  400 mg Oral Daily   methylPREDNISolone  (SOLU-MEDROL ) injection  80 mg Intravenous Q24H   multivitamin with minerals  1 tablet Oral q AM   revefenacin   175 mcg Nebulization Daily   torsemide  20 mg Oral Daily    Continuous Infusions:   PRN Meds: acetaminophen , albuterol , alum & mag hydroxide-simeth, guaiFENesin -dextromethorphan , LORazepam , melatonin, polyethylene glycol, prochlorperazine   Physical Exam Vitals reviewed.  Constitutional:      Interventions: Nasal cannula in place.  HENT:     Head: Normocephalic and atraumatic.  Cardiovascular:     Rate and Rhythm: Normal rate.  Pulmonary:     Effort: Tachypnea present.  Skin:    General: Skin is warm and dry.  Neurological:     Mental Status: She is alert and oriented to person, place, and time.  Psychiatric:        Mood  and Affect: Mood normal.        Behavior: Behavior normal.             Vital Signs: BP (!) 121/48 (BP Location: Right Arm)   Pulse 70   Temp 98.7 F (37.1 C) (Oral)   Resp 20   Ht 5\' 1"  (1.549 m)   Wt 80.6 kg   SpO2 96%  BMI 33.57 kg/m  SpO2: SpO2: 96 % O2 Device: O2 Device: Nasal Cannula O2 Flow Rate: O2 Flow Rate (L/min): 3 L/min    Patient Active Problem List   Diagnosis Date Noted   Acute on chronic hypoxic respiratory failure (HCC) 03/18/2024   Acute on chronic respiratory failure with hypercapnia (HCC) 03/16/2024   Chronic respiratory failure with hypoxia (HCC) 03/16/2024   Lactic acidosis 12/08/2023   CHF (congestive heart failure) (HCC)    COPD with acute exacerbation (HCC) 12/07/2023   Multiple bruises 05/31/2023   Lump of right breast 05/31/2023   Alcohol abuse 02/17/2023   Chronic idiopathic constipation 02/17/2023   Family history of colon cancer 02/17/2023   Imaging of gastrointestinal tract abnormal 02/17/2023   Fatty liver 02/17/2023   Obesity 02/17/2023   Estrogen deficiency 11/14/2022   COPD exacerbation (HCC) 11/07/2022   Pain, abdominal, RLQ 06/28/2022   Low back pain 03/14/2022   Constipation 03/14/2022   Tobacco abuse 07/11/2021   Abnormal mammogram 04/13/2021   Trichomonal vulvovaginitis 04/13/2021   Vaginal discharge 04/13/2021   Aortic atherosclerosis (HCC) 04/13/2021   Tracheomalacia 11/23/2020   Vitamin D  deficiency 01/03/2020   Hyperlipidemia 01/03/2020   Acute pancreatitis 09/09/2019   Epigastric pain 09/06/2019   Back pain 09/06/2019   Eustachian tube dysfunction 05/18/2019   Insomnia 05/18/2019   Chronic respiratory failure with hypoxia and hypercapnia (HCC) 12/14/2018   Chronic obstructive lung disease (HCC) 03/18/2017   Former smoker 03/07/2017   Left lumbar radiculopathy 10/19/2015   Cough 08/29/2015   Pedal edema 02/22/2015   Non-insulin  dependent type 2 diabetes mellitus (HCC) 08/20/2014   Pruritus 07/08/2014    Nocturnal hypoxemia 07/05/2014   Esophagus, foreign body 06/12/2014   Polycythemia, secondary 02/16/2014   5 mm Lung nodule, solitary 02/02/2014   Acute on chronic respiratory failure with hypoxia and hypercapnia (HCC) 02/01/2014   GERD (gastroesophageal reflux disease) 02/01/2014   Hypersomnolence 02/01/2014   Allergic rhinitis 09/03/2013   CAD (coronary artery disease) 09/03/2013   Encounter for well adult exam with abnormal findings 09/03/2013   Essential hypertension 07/07/2013   COPD (chronic obstructive pulmonary disease) with chronic bronchitis (HCC) 07/07/2013   Status post coronary artery bypass grafting 07/07/2013   Headache 07/01/2013   Polycythemia 07/01/2013    Palliative Care Assessment & Plan   Patient Profile: 68 y.o. female  with past medical history of chronic HFpEF, COPD, chronic hypoxic respiratory failure on 3 L [place] at baseline, OSA, chronic hypercarbia, BiPAP nightly, who was recently admitted for acute COPD exacerbation and discharged 1 day prior to presentation admitted on 03/18/2024 with shortness of breath, orthopnea, wheezing and hypoxia on her baseline oxygen .   Admitted for acute on chronic hypoxic respiratory failure.  Today's Discussion: Patient sitting up in recliner. She had a good day yesterday and slept well last night. She feels her breathing is a little worse today than it was yesterday but is better than when she was admitted. Yesterday after our meeting she shared her goals of care with her children and extended family. She feels good about the discussions. She also spoke with her pulmonologist about her upcoming beach trip and symptom management with morphine  (he would like to hold off on this). Patient's son Alease Hunter arrived. Alease Hunter shared he is hopeful the patient will continue feeling better and be stable at discharge and for the upcoming beach trip.  Goals are clear to continue treating the treatable and follow up with outpatient palliative care.  Encouraged family to call PMT with needs.  PMT will continue to follow peripherally.  Recommendations/Plan: Change to DNR/DNI Continue treating the treatable TOC referral for outpatient palliative care for symptom management Spiritual care referral PMT to follow peripherally- Please call PMT [phone] for needs    Code Status:    Code Status Orders  (From admission, onward)           Start     Ordered   03/21/24 0848  Do not attempt resuscitation (DNR)- Limited -Do Not Intubate (DNI)  (Code Status)  Continuous       Question Answer Comment  If pulseless and not breathing No CPR or chest compressions.   In Pre-Arrest Conditions (Patient Is Breathing and Has A Pulse) Do not intubate. Provide all appropriate non-invasive medical interventions. Avoid ICU transfer unless indicated or required.   Consent: Discussion documented in EHR or advanced directives reviewed      03/21/24 0847           Extensive chart review has been completed prior to seeing the patient including labs, vital signs, imaging, progress/consult notes, orders, medications, and available advance directive documents.   Time spent: 50 minutes  Thank you for allowing the Palliative Medicine Team to assist in the care of this patient.     Daina Drum, NP  Please contact Palliative Medicine Team phone at [phone] for questions and concerns.

## 2024-03-22 NOTE — Progress Notes (Signed)
 Pt's CBG resulted at [phone] this evening, attempted to notify Howerter, MD. Awaiting further orders.  Sonjia Durie, RN

## 2024-03-22 NOTE — Progress Notes (Signed)
 PROGRESS NOTE  [name] ZDG:[id number] DOB: 02-25-1956 DOA: 03/18/2024 PCP: Roslyn Coombe, MD   LOS: 4 days   Brief narrative:  [name] is a 68 y.o. female with medical history significant for chronic HFpEF, COPD, chronic hypoxic respiratory failure on 3 L [place] at baseline, OSA, chronic hypercarbia, possible obesity hypoventilation syndrome on BiPAP nightly, who was recently admitted for acute COPD exacerbation and discharged 1 day prior to presentation presented to hospital with shortness of breath, orthopnea wheezing and hypoxia on her baseline 3 L of oxygen .  In the ED, patient was tachycardic, pulse ox was mid 80s on 3 L nasal cannula.  Her O2 supplementation was turned up to 5-6 L with improvement of her O2 saturation to the low 90s.  Conversational dyspnea was noted on exam.  Chest x-ray revealed cardiomegaly with mild pulmonary congestion.   CBC and BMP unremarkable.  BNP of 64.  VBG was concerning for compensatory respiratory acidosis with pCO2 of 90.9 and pH of 7.337.  Patient received IV Lasix  in the ED with some improvement in her symptoms.  Patient was then considered for admission to hospital for further evaluation and treatment.     Assessment/Plan: Principal Problem:   Acute on chronic hypoxic respiratory failure (HCC)   Acute on chronic hypoxic and hypercarbic respiratory failure likely secondary acute on chronic HFpEF, severe COPD. At 3 L of oxygen  at baseline.  Uses BiPAP at nighttime for sleep apnea and CO2 retention.  Initially needed 5 to 6 L subsequently has been weaned down to 3 L.  On IV diuresis with Lasix .  Will transition to oral.  PCCM on board and patient has been switched to triple therapy neb with Solu-Medrol  from prednisone .  Continue NIV nocturnally and during the daytime.  Patient has been steroid-dependent.  Patient likely nearing end-stage COPD.  Palliative care on board at this time discussing about further goals of care.  At this time patient is  DNR/DNI.  Discussed again about palliative care set up at home.  Still feels fatigued and weak.  Acute on chronic HFpEF Last 2 D echo done on 05/29/2022 revealed LVEF 55 to 60% with grade 2 diastolic dysfunction. Continue strict intake and output charting Daily weights.  No overt peripheral edema.  On IV Lasix  40 daily.  Changed to oral.  Continue strict intake and output charting.  2D echocardiogram showed LV ejection fraction of 70 to 75% with LVH and grade 1 diastolic dysfunction.  Patient is negative balance for 1680 mL.  OSA with suspected obesity hypoventilation syndrome, CO2 retention Compensatory chronic respiratory acidosis. BiPAP nightly and during naps. Last pCO2 90.9 with pH of 7.327 on VBG on presentation.  Currently alert awake and Communicative.   Class I obesity Body mass index is 33.78 kg/m. Patient would benefit from ongoing weight loss as outpatient.   Type 2 diabetes with hyperglycemia Last hemoglobin A1c 7.4 on 03/11/2024.  Continue diabetic diet sliding scale insulin  Accu-Cheks.  Latest POC glucose of 161.   Non anion gap metabolic alkalosis Serum bicarb 42, likely compensatory from chronic respiratory acidosis   Generalized weakness PT OT evaluation recommend home health on discharge.  Goals of care. Discussed with the patient at length regarding end-stage COPD and recurrent admissions.  Currently palliative care on board regarding further goals of care.     DVT prophylaxis: enoxaparin  (LOVENOX ) injection 40 mg Start: 03/19/24 1000   Disposition: Uncertain at this time, likely home with home health in 1 to 2 days.  Status is: Inpatient Remains inpatient appropriate because: Pending clinical improvement,  recurrent admissions, high risk of readmissions, IV Solu-Medrol ,     Code Status:     Code Status: Limited: Do not attempt resuscitation (DNR) -DNR-LIMITED -Do Not Intubate/DNI   Family Communication: Spoke with the patient's sister at bedside on  03/19/2024.  Consultants: Pulmonary.   Procedures: BiPAP at nighttime.  Anti-infectives:  Levaquin   Anti-infectives (From admission, onward)    Start     Dose/Rate Route Frequency Ordered Stop   03/19/24 1000  levofloxacin  (LEVAQUIN ) tablet 500 mg        500 mg Oral Daily 03/18/24 2222         Subjective: Today, patient was seen and examined at bedside.  Patient feels fatigued and weak with some chest discomfort.  Breathing overall is improved.  Still has some cough and congestion.  Denies any pain, nausea, vomiting, fever, chills or rigor  Objective: Vitals:   03/22/24 0635 03/22/24 0804  BP: 119/71 (!) 121/48  Pulse: 79 70  Resp: 20 20  Temp: 97.8 F (36.6 C) 98.7 F (37.1 C)  SpO2: 93% 96%    Intake/Output Summary (Last 24 hours) at 03/22/2024 1139 Last data filed at 03/22/2024 1053 Gross per 24 hour  Intake 340 ml  Output 500 ml  Net -160 ml   Filed Weights   03/19/24 0413 03/20/24 0350 03/22/24 0500  Weight: 81.1 kg 79.7 kg 80.6 kg   Body mass index is 33.57 kg/m.   Physical Exam:  GENERAL: Patient is alert awake and oriented.  On 3 L of oxygen  by nasal cannula, obese built.  Sitting up in the chair.  Communicative able to complete full sentences.  Appears short winded HENT: No scleral pallor or icterus. Pupils equally reactive to light. Oral mucosa is moist NECK: is supple, no gross swelling noted. CHEST:   Diminished breath sounds bilaterally.  Coarse breath sounds noted. CVS: S1 and S2 heard, no murmur. Regular rate and rhythm.  ABDOMEN: Soft, non-tender, bowel sounds are present. EXTREMITIES: No obvious edema. CNS: Cranial nerves are intact. No focal motor deficits. SKIN: warm and dry without rashes.  Data Review: I have personally reviewed the following laboratory data and studies,  CBC: Recent Labs  Lab 03/18/24 1624 03/18/24 1917 03/19/24 0515 03/20/24 0237 03/21/24 0235 03/22/24 0236  WBC 9.8  --  11.8* 8.0 10.9* 11.5*  HGB 14.3 15.3*  14.4 14.3 13.9 13.9  HCT 47.7* 45.0 47.6* 46.1* 45.4 44.9  MCV 92.8  --  91.7 89.7 89.7 88.7  PLT 216  --  215 224 213 213   Basic Metabolic Panel: Recent Labs  Lab 03/16/24 0729 03/17/24 0231 03/18/24 1624 03/18/24 1917 03/19/24 0515 03/20/24 0237 03/21/24 0235 03/22/24 0236  NA 138 141 140 140 143 141 139 139  K 5.2* 5.1 4.6 4.6 4.1 4.6 5.0 4.3  CL 89* 92* 91*  --  91* 87* 88* 86*  CO2 38* 36* 38*  --  42* 44* 41* 42*  GLUCOSE 174* 185* 206*  --  142* 187* 177* 208*  BUN 24* 26* 22  --  18 22 24* 25*  CREATININE 0.59 0.62 0.60  --  0.55 0.74 0.73 0.73  CALCIUM  9.5 9.5 9.5  --  9.6 9.4 9.3 9.3  MG 2.5* 2.5*  --   --  2.0 2.6* 2.5* 2.5*  PHOS 4.3 4.4  --   --  2.7  --   --   --    Liver Function  Tests: Recent Labs  Lab 03/16/24 0729 03/17/24 0231  ALBUMIN 3.6 3.7   No results for input(s): "LIPASE", "AMYLASE" in the last 168 hours. No results for input(s): "AMMONIA" in the last 168 hours. Cardiac Enzymes: No results for input(s): "CKTOTAL", "CKMB", "CKMBINDEX", "TROPONINI" in the last 168 hours. BNP (last 3 results) Recent Labs    12/07/23 2228 03/15/24 0419 03/18/24 1624  BNP 133.0* 53.2 64.3    ProBNP (last 3 results) No results for input(s): "PROBNP" in the last 8760 hours.  CBG: Recent Labs  Lab 03/21/24 0616 03/21/24 1140 03/21/24 1611 03/21/24 2147 03/22/24 0622  GLUCAP 161* 187* 394* 299* 177*   Recent Results (from the past 240 hours)  Resp panel by RT-PCR (RSV, Flu A&B, Covid) Anterior Nasal Swab     Status: None   Collection Time: 03/15/24  4:59 AM   Specimen: Anterior Nasal Swab  Result Value Ref Range Status   SARS Coronavirus 2 by RT PCR NEGATIVE NEGATIVE Final   Influenza A by PCR NEGATIVE NEGATIVE Final   Influenza B by PCR NEGATIVE NEGATIVE Final    Comment: (NOTE) The Xpert Xpress SARS-CoV-2/FLU/RSV plus assay is intended as an aid in the diagnosis of influenza from Nasopharyngeal swab specimens and should not be used as a sole  basis for treatment. Nasal washings and aspirates are unacceptable for Xpert Xpress SARS-CoV-2/FLU/RSV testing.  Fact Sheet for Patients: BloggerCourse.com  Fact Sheet for Healthcare Providers: SeriousBroker.it  This test is not yet approved or cleared by the [place]  FDA and has been authorized for detection and/or diagnosis of SARS-CoV-2 by FDA under an Emergency Use Authorization (EUA). This EUA will remain in effect (meaning this test can be used) for the duration of the COVID-19 declaration under Section 564(b)(1) of the Act, 21 U.S.C. section 360bbb-3(b)(1), unless the authorization is terminated or revoked.     Resp Syncytial Virus by PCR NEGATIVE NEGATIVE Final    Comment: (NOTE) Fact Sheet for Patients: BloggerCourse.com  Fact Sheet for Healthcare Providers: SeriousBroker.it  This test is not yet approved or cleared by the [place]  FDA and has been authorized for detection and/or diagnosis of SARS-CoV-2 by FDA under an Emergency Use Authorization (EUA). This EUA will remain in effect (meaning this test can be used) for the duration of the COVID-19 declaration under Section 564(b)(1) of the Act, 21 U.S.C. section 360bbb-3(b)(1), unless the authorization is terminated or revoked.  Performed at [facility], [address], [place], [place] [address]   Respiratory (~20 pathogens) panel by PCR     Status: None   Collection Time: 03/15/24  4:59 AM   Specimen: Nasopharyngeal Swab; Respiratory  Result Value Ref Range Status   Adenovirus NOT DETECTED NOT DETECTED Final   Coronavirus 229E NOT DETECTED NOT DETECTED Final    Comment: (NOTE) The Coronavirus on the Respiratory Panel, DOES NOT test for the novel  Coronavirus (2019 nCoV)    Coronavirus HKU1 NOT DETECTED NOT DETECTED Final   Coronavirus NL63 NOT DETECTED NOT DETECTED Final   Coronavirus OC43 NOT  DETECTED NOT DETECTED Final   Metapneumovirus NOT DETECTED NOT DETECTED Final   Rhinovirus / Enterovirus NOT DETECTED NOT DETECTED Final   Influenza A NOT DETECTED NOT DETECTED Final   Influenza B NOT DETECTED NOT DETECTED Final   Parainfluenza Virus 1 NOT DETECTED NOT DETECTED Final   Parainfluenza Virus 2 NOT DETECTED NOT DETECTED Final   Parainfluenza Virus 3 NOT DETECTED NOT DETECTED Final   Parainfluenza Virus 4 NOT  DETECTED NOT DETECTED Final   Respiratory Syncytial Virus NOT DETECTED NOT DETECTED Final   Bordetella pertussis NOT DETECTED NOT DETECTED Final   Bordetella Parapertussis NOT DETECTED NOT DETECTED Final   Chlamydophila pneumoniae NOT DETECTED NOT DETECTED Final   Mycoplasma pneumoniae NOT DETECTED NOT DETECTED Final    Comment: Performed at [facility], [address], [place], [place] [address]  MRSA Next Gen by PCR, Nasal     Status: None   Collection Time: 03/19/24 12:16 AM   Specimen: Nasal Mucosa; Nasal Swab  Result Value Ref Range Status   MRSA by PCR Next Gen NOT DETECTED NOT DETECTED Final    Comment: (NOTE) The GeneXpert MRSA Assay (FDA approved for NASAL specimens only), is one component of a comprehensive MRSA colonization surveillance program. It is not intended to diagnose MRSA infection nor to guide or monitor treatment for MRSA infections. Test performance is not FDA approved in patients less than 30 years old. Performed at [facility], [address] [place], [place] [address]      Studies: No results found.     Kortnee Bas, MD  [facility] 03/22/2024  If 7PM-7AM, please contact night-coverage

## 2024-03-22 NOTE — Telephone Encounter (Signed)
 See Dr Villa Greaser note he wants HFU on 04/05/2024

## 2024-03-22 NOTE — Progress Notes (Addendum)
 Mobility Specialist Progress Note;   03/22/24 0916  Mobility  Activity Ambulated with assistance in hallway  Level of Assistance Contact guard assist, steadying assist  Assistive Device Front wheel walker  Distance Ambulated (ft) 80 ft  Activity Response Tolerated well  Mobility Referral Yes  Mobility visit 1 Mobility  Mobility Specialist Start Time (ACUTE ONLY) [id number]  Mobility Specialist Stop Time (ACUTE ONLY) 0930  Mobility Specialist Time Calculation (min) (ACUTE ONLY) 14 min   Pt eager for mobility. Required MinG assistance during ambulation for safety. Ambulated on 3LO2, VSS throughout. Took 2x standing rest breaks d/t fatigue, otherwise asx. Pt returned back to chair with all needs met, call bell in reach.  Janit Meline Mobility Specialist Please contact via SecureChat or [employer] [phone]

## 2024-03-22 NOTE — TOC Initial Note (Signed)
 Transition of Care [facility]) - Initial/Assessment Note    Patient Details  Name: [name] MRN: [id number] Date of Birth: 1956/07/29  Transition of Care [facility]) CM/SW Contact:    Jeani Mill, RN Phone Number: 03/22/2024, 12:46 PM  Clinical Narrative:                 Spoke to patient at bedside regarding transition needs.  Patient is agreeable to home health and OP palliative.  Melissa with [facility] notified of palliative need. This RNCM offered choice for Home  Health, Patient states she has no preference, RNCM made referral to [facility] with [facility], She is able to take referral.  Patient has 3L 02 from [employer] at baseline, family will bring portable tank. TOC will continue to follow for needs. Address, Phone number and PCP verified.  Need home health PT, OT orders.   Expected Discharge Plan: Home w Home Health Services Barriers to Discharge: Continued Medical Work up   Patient Goals and CMS Choice Patient states their goals for this hospitalization and ongoing recovery are:: return home CMS Medicare.gov Compare Post Acute Care list provided to:: Patient Choice offered to / list presented to : Patient      Expected Discharge Plan and Services   Discharge Planning Services: CM Consult Post Acute Care Choice: Home Health Living arrangements for the past 2 months: Single Family Home                           HH Arranged: PT, OT HH Agency: [facility] Date [facility] Agency Contacted: 03/22/24 Time HH Agency Contacted: 1245 Representative spoke with at [facility] Agency: Imelda Man  Prior Living Arrangements/Services Living arrangements for the past 2 months: Single Family Home Lives with:: Significant Other Patient language and need for interpreter reviewed:: Yes Do you feel safe going back to the place where you live?: Yes      Need for Family Participation in Patient Care: Yes (Comment) Care giver support system in place?: Yes (comment) Current home services: DME  (Home oxygen  3L from [employer]) Criminal Activity/Legal Involvement Pertinent to Current Situation/Hospitalization: No - Comment as needed  Activities of Daily Living   ADL Screening (condition at time of admission) Independently performs ADLs?: Yes (appropriate for developmental age) Is the patient deaf or have difficulty hearing?: No Does the patient have difficulty seeing, even when wearing glasses/contacts?: No Does the patient have difficulty concentrating, remembering, or making decisions?: No  Permission Sought/Granted                  Emotional Assessment Appearance:: Appears stated age Attitude/Demeanor/Rapport: Engaged Affect (typically observed): Accepting Orientation: : Oriented to  Time, Oriented to Situation, Oriented to Place, Oriented to Self Alcohol / Substance Use: Not Applicable Psych Involvement: No (comment)  Admission diagnosis:  Pulmonary vascular congestion [R09.89] Dyspnea, unspecified type [R06.00] Acute on chronic hypoxic respiratory failure (HCC) [J96.21] Patient Active Problem List   Diagnosis Date Noted   Acute on chronic hypoxic respiratory failure (HCC) 03/18/2024   Acute on chronic respiratory failure with hypercapnia (HCC) 03/16/2024   Chronic respiratory failure with hypoxia (HCC) 03/16/2024   Lactic acidosis 12/08/2023   CHF (congestive heart failure) (HCC)    COPD with acute exacerbation (HCC) 12/07/2023   Multiple bruises 05/31/2023   Lump of right breast 05/31/2023   Alcohol abuse 02/17/2023   Chronic idiopathic constipation 02/17/2023   Family history of colon cancer 02/17/2023   Imaging of gastrointestinal tract abnormal  02/17/2023   Fatty liver 02/17/2023   Obesity 02/17/2023   Estrogen deficiency 11/14/2022   COPD exacerbation (HCC) 11/07/2022   Pain, abdominal, RLQ 06/28/2022   Low back pain 03/14/2022   Constipation 03/14/2022   Tobacco abuse 07/11/2021   Abnormal mammogram 04/13/2021   Trichomonal vulvovaginitis 04/13/2021    Vaginal discharge 04/13/2021   Aortic atherosclerosis (HCC) 04/13/2021   Tracheomalacia 11/23/2020   Vitamin D  deficiency 01/03/2020   Hyperlipidemia 01/03/2020   Acute pancreatitis 09/09/2019   Epigastric pain 09/06/2019   Back pain 09/06/2019   Eustachian tube dysfunction 05/18/2019   Insomnia 05/18/2019   Chronic respiratory failure with hypoxia and hypercapnia (HCC) 12/14/2018   Chronic obstructive lung disease (HCC) 03/18/2017   Former smoker 03/07/2017   Left lumbar radiculopathy 10/19/2015   Cough 08/29/2015   Pedal edema 02/22/2015   Non-insulin  dependent type 2 diabetes mellitus (HCC) 08/20/2014   Pruritus 07/08/2014   Nocturnal hypoxemia 07/05/2014   Esophagus, foreign body 06/12/2014   Polycythemia, secondary 02/16/2014   5 mm Lung nodule, solitary 02/02/2014   Acute on chronic respiratory failure with hypoxia and hypercapnia (HCC) 02/01/2014   GERD (gastroesophageal reflux disease) 02/01/2014   Hypersomnolence 02/01/2014   Allergic rhinitis 09/03/2013   CAD (coronary artery disease) 09/03/2013   Encounter for well adult exam with abnormal findings 09/03/2013   Essential hypertension 07/07/2013   COPD (chronic obstructive pulmonary disease) with chronic bronchitis (HCC) 07/07/2013   Status post coronary artery bypass grafting 07/07/2013   Headache 07/01/2013   Polycythemia 07/01/2013   PCP:  Roslyn Coombe, MD Pharmacy:   [id number] [place], [place] - [address] [place] [address] Phone: [phone] Fax: [phone]  [id number] [place], [place] - [address] AT [facility] & [address] [place] [address] Phone: [phone] Fax: [phone]  [id number] [place] [address] [place], [place] - [address] [place] [address] Phone: [phone] Fax: [phone]  [place] [facility] [address] [place]  [address] Phone: [phone] Fax: [phone]  [facility] [address] [place] [address] Phone: [phone] Fax: [phone]     Social Drivers of Health (SDOH) Social History: SDOH Screenings   Food Insecurity: No Food Insecurity (03/19/2024)  Housing: Low Risk  (03/19/2024)  Transportation Needs: No Transportation Needs (03/19/2024)  Utilities: Not At Risk (03/19/2024)  Alcohol Screen: Low Risk  (03/10/2024)  Depression (PHQ2-9): Low Risk  (03/11/2024)  Financial Resource Strain: Low Risk  (03/10/2024)  Physical Activity: Sufficiently Active (03/10/2024)  Social Connections: Moderately Integrated (03/19/2024)  Stress: No Stress Concern Present (03/10/2024)  Tobacco Use: Medium Risk (03/18/2024)  Health Literacy: Adequate Health Literacy (06/16/2023)   SDOH Interventions:     Readmission Risk Interventions    03/16/2024   12:56 PM  Readmission Risk Prevention Plan  Transportation Screening Complete  Home Care Screening Complete  Medication Review (RN CM) Complete

## 2024-03-22 NOTE — Care Management Important Message (Signed)
 Important Message  Patient Details  Name: [name] MRN: [id number] Date of Birth: 20-May-1956   Important Message Given:  Yes - Medicare IM     Wynonia Hedges 03/22/2024, 1:55 PM

## 2024-03-22 NOTE — Progress Notes (Signed)
 [place] [facility]- [facility] Liaison Note:  Notified by [facility] manager, Gillian Lacrosse of patient/family request for [facility] Palliative services at home after discharge.  Please call with any hospice or outpatient palliative care related questions.  Thank you for the opportunity to participate in this patient's care.  Ardine Beckwith, LPN [facility] Liaison [phone]

## 2024-03-22 NOTE — Plan of Care (Signed)
  Problem: Education: Goal: Ability to describe self-care measures that may prevent or decrease complications (Diabetes Survival Skills Education) will improve Outcome: Progressing   Problem: Health Behavior/Discharge Planning: Goal: Ability to identify and utilize available resources and services will improve Outcome: Progressing   Problem: Education: Goal: Knowledge of General Education information will improve Description: Including pain rating scale, medication(s)/side effects and non-pharmacologic comfort measures Outcome: Progressing   Problem: Health Behavior/Discharge Planning: Goal: Ability to manage health-related needs will improve Outcome: Progressing   Problem: Activity: Goal: Risk for activity intolerance will decrease Outcome: Progressing   Problem: Nutrition: Goal: Adequate nutrition will be maintained Outcome: Progressing   Problem: Pain Managment: Goal: General experience of comfort will improve and/or be controlled Outcome: Progressing

## 2024-03-22 NOTE — Progress Notes (Signed)
 [facility] night cross cover note:   I was notified by the patient's RN  that pt's HS cbg is 433. This is relative to cbg of 207 at 1630 prompting receipt of 7 units of sq novolog .   I have ordered Novolog  8 units sq x 1 dose now followed by a repeat CBG to be checked in approximately 1 hour, and have requested to be notified if ensuing CBG result remains greater than 400.     Camelia Cavalier, DO Hospitalist

## 2024-03-22 NOTE — Telephone Encounter (Signed)
**Note De-identified  [place] Obfuscation** Please advise 

## 2024-03-22 NOTE — Progress Notes (Addendum)
 [facility] night cross cover note:   I was notified by the patient's RN that the pt continues to endorse intermittent lightheadedness, without significant change throughout the day, and in the absence of any new symptoms this evening.   Final systolic blood pressure during dayshift was noted to be in the low 100's mmHg, which was slightly lower than earlier in the day, during which her SBP's were in the 1 teens to 130s mmHg. she has persistently wide pulse pressures.  Blood pressure recheck at the beginning of this evening's night shift shows interval improvement in systolic blood pressures into the 120s mmHg.  Other vital signs at this time are notable for afebrile, heart rates in the 70s, RR 19-20, with oxygen  saturation 96 to 97% on 3 L nasal cannula, unchanged from her supplemental oxygen  requirements over the last 36 hours.  Per brief chart review, it appears that this patient has end-stage COPD, admitted with suspected superimposed heart failure, undergoing diuresis, with anticipation of discharge on palliative care over the next few days.   In the setting of stable appearing intermittent lightheadedness, unchanged relative to day shift, without any new symptoms, and with stable appearing VS, will continue to monitor for now. I have asked pt's RN to please notify me if SBP decreases into the 90's mmHg, and have confirmed existing order for fall precautions.  It is noted that she is already working with PT during this hospitalization.  VBG added to AM labs.     Camelia Cavalier, DO Hospitalist

## 2024-03-23 ENCOUNTER — Inpatient Hospital Stay: Admitting: Internal Medicine

## 2024-03-23 ENCOUNTER — Other Ambulatory Visit (HOSPITAL_COMMUNITY): Payer: Self-pay

## 2024-03-23 DIAGNOSIS — J9621 Acute and chronic respiratory failure with hypoxia: Secondary | ICD-10-CM | POA: Diagnosis not present

## 2024-03-23 LAB — BASIC METABOLIC PANEL WITH GFR
Anion gap: 9 (ref 5–15)
BUN: 22 mg/dL (ref 8–23)
CO2: 42 mmol/L — ABNORMAL HIGH (ref 22–32)
Calcium: 9.4 mg/dL (ref 8.9–10.3)
Chloride: 89 mmol/L — ABNORMAL LOW (ref 98–111)
Creatinine, Ser: 0.62 mg/dL (ref 0.44–1.00)
GFR, Estimated: >60 mL/min (ref >60)
Glucose, Bld: 186 mg/dL — ABNORMAL HIGH (ref 70–99)
Potassium: 4.3 mmol/L (ref 3.5–5.1)
Sodium: 140 mmol/L (ref 135–145)

## 2024-03-23 LAB — BLOOD GAS, VENOUS
Acid-Base Excess: 26.9 mmol/L — ABNORMAL HIGH (ref 0.0–2.0)
Bicarbonate: 55.6 mmol/L — ABNORMAL HIGH (ref 20.0–28.0)
O2 Saturation: 86.8 %
Patient temperature: 35.8
pCO2, Ven: 69 mmHg — ABNORMAL HIGH (ref 44–60)
pH, Ven: 7.51 — ABNORMAL HIGH (ref 7.25–7.43)
pO2, Ven: 48 mmHg — ABNORMAL HIGH (ref 32–45)

## 2024-03-23 LAB — GLUCOSE, CAPILLARY: Glucose-Capillary: 140 mg/dL — ABNORMAL HIGH (ref 70–99)

## 2024-03-23 MED ORDER — PREDNISONE 10 MG PO TABS: 10.0000 mg | ORAL_TABLET | Freq: Every day | ORAL | Status: DC

## 2024-03-23 MED ORDER — TORSEMIDE 20 MG PO TABS
20.0000 mg | ORAL_TABLET | Freq: Every day | ORAL | 2 refills | Status: DC
  Filled 2024-03-23: qty 30, 30d supply, fill #0

## 2024-03-23 MED ORDER — GUAIFENESIN-DM 100-10 MG/5ML PO SYRP
5.0000 mL | ORAL_SOLUTION | ORAL | 0 refills | Status: DC | PRN
  Filled 2024-03-23: qty 118, 4d supply, fill #0

## 2024-03-23 MED ORDER — INSULIN PEN NEEDLE 31G X 8 MM MISC
0 refills | Status: DC
  Filled 2024-03-23: qty 100, 30d supply, fill #0

## 2024-03-23 MED ORDER — PREDNISONE 10 MG PO TABS
ORAL_TABLET | ORAL | 0 refills | Status: DC
  Filled 2024-03-23: qty 40, 10d supply, fill #0

## 2024-03-23 MED ORDER — INSULIN GLARGINE 100 UNIT/ML SOLOSTAR PEN
10.0000 [IU] | PEN_INJECTOR | Freq: Every day | SUBCUTANEOUS | 0 refills | Status: DC
Start: 2024-03-23 — End: 2024-03-23
  Filled 2024-03-23: qty 3, 30d supply, fill #0

## 2024-03-23 MED ORDER — NOVOLOG FLEXPEN RELION 100 UNIT/ML ~~LOC~~ SOPN
PEN_INJECTOR | SUBCUTANEOUS | 0 refills | Status: DC
  Filled 2024-03-23: qty 15, 25d supply, fill #0

## 2024-03-23 NOTE — Discharge Summary (Signed)
 Physician Discharge Summary  [name] UVO:[id number] DOB: Nov 05, 1955 DOA: 03/18/2024  PCP: Roslyn Coombe, MD  Admit date: 03/18/2024 Discharge date: 03/23/2024  Admitted From: Home  Discharge disposition: Home with home health   Recommendations for Outpatient Follow-Up:   Follow up with your primary care provider in one week.  Check CBC, BMP, magnesium  in the next visit Patient will benefit from palliative care evaluation and follow-up as outpatient. Follow-up with Dr. Villa Greaser pulmonary on 04/03/2024.   Discharge Diagnosis:   Principal Problem:   Acute on chronic hypoxic respiratory failure (HCC) End-stage COPD.  Discharge Condition: Improved.  Diet recommendation: Low sodium, heart healthy.    Wound care: None.  Code status: Full.   History of Present Illness:   [name] is a 68 y.o. female with medical history significant for chronic HFpEF, COPD, chronic hypoxic respiratory failure on 3 L [place] at baseline, OSA, chronic hypercarbia, possible obesity hypoventilation syndrome on BiPAP nightly, who was recently admitted for acute COPD exacerbation and discharged 1 day prior to presentation presented to hospital with shortness of breath, orthopnea wheezing and hypoxia on her baseline 3 L of oxygen .  In the ED, patient was tachycardic, pulse ox was mid 80s on 3 L nasal cannula.  Her O2 supplementation was turned up to 5-6 L with improvement of her O2 saturation to the low 90s.  Conversational dyspnea was noted on exam.  Chest x-ray revealed cardiomegaly with mild pulmonary congestion.   CBC and BMP unremarkable.  BNP of 64.  VBG was concerning for compensatory respiratory acidosis with pCO2 of 90.9 and pH of 7.337.  Patient received IV Lasix  in the ED with some improvement in her symptoms.  Patient was then considered for admission to hospital for further evaluation and treatment.    Hospital Course:   Following conditions were addressed during hospitalization as listed  below,  Acute on chronic hypoxic and hypercarbic respiratory failure likely secondary acute on chronic HFpEF, severe COPD. At 3 L of oxygen  at baseline.  Uses BiPAP at nighttime for sleep apnea and CO2 retention.  Initially needed 5 to 6 L subsequently has been weaned down to 3 L.  Patient initially received IV diuresis with Lasix .  Will transition to torsemide 20 mg daily on discharge.  Was taking Lasix  every other day at home.  PCCM was consulted and patient received  triple therapy neb with Solu-Medrol  .will continue with steroid taper on discharge with insulin  regimen.  Patient is steroid-dependent and takes prednisone  10 mg at bedtime and has a near end-stage COPD.  Palliative care was consulted during hospitalization and plan is to proceed with outpatient palliative care on discharge.  At this time patient is DNR/DNI.     Acute on chronic HFpEF Last 2 D echo done on 05/29/2022 revealed LVEF 55 to 60% with grade 2 diastolic dysfunction. No overt peripheral edema.  Received IV Lasix  and will be transition to torsemide on discharge.  2D echocardiogram showed LV ejection fraction of 70 to 75% with LVH and grade 1 diastolic dysfunction.     OSA with suspected obesity hypoventilation syndrome, CO2 retention Compensatory chronic respiratory acidosis. BiPAP nightly and during naps. Last pCO2 90.9 with pH of 7.327 on VBG on presentation.  Currently alert awake and Communicative.  Advised compliance with BiPAP.   Class I obesity Body mass index is 33.78 kg/m. Patient would benefit from ongoing weight loss as outpatient.   Type 2 diabetes with hyperglycemia Last hemoglobin A1c 7.4 on 03/11/2024.  Continue diabetic diet sliding scale insulin  on discharge since patient will be on steroids.  Diabetic coordinator  followed the patient during hospitalization  Non anion gap metabolic alkalosis Serum bicarb 42, likely compensatory from chronic respiratory acidosis   Generalized weakness PT OT evaluation  recommend home health on discharge.   Goals of care. Discussed with the patient at length regarding end-stage COPD and recurrent admissions.  Seen by palliative care during hospitalization.  Disposition.  At this time, patient is stable for disposition home with outpatient PCP, palliative care and pulmonary follow-up.  Medical Consultants:   Pulmonary Palliative care.  Procedures:    BiPAP  Subjective:   Today, patient was seen and examined at bedside.  Patient states that she feels better than before.  Wishes to go home.  Discharge Exam:   Vitals:   03/23/24 0818 03/23/24 0820  BP:    Pulse:    Resp:    Temp:    SpO2: 95% 95%   Vitals:   03/23/24 0547 03/23/24 0747 03/23/24 0818 03/23/24 0820  BP: 125/61 (!) 126/45    Pulse: 60 (!) 51    Resp: 19 16    Temp: 97.7 F (36.5 C) 98.4 F (36.9 C)    TempSrc: Oral Oral    SpO2: 96% 95% 95% 95%  Weight: 80.1 kg     Height:       Body mass index is 33.37 kg/m.   General: Alert awake, not in obvious distress, has baseline dyspnea.  On nasal cannula oxygen  HENT: pupils equally reacting to light,  No scleral pallor or icterus noted. Oral mucosa is moist.  Chest:    Diminished breath sounds bilaterally.  Coarse breath sounds. CVS: S1 &S2 heard. No murmur.  Regular rate and rhythm. Abdomen: Soft, nontender, nondistended.  Bowel sounds are heard.   Extremities: No cyanosis, clubbing or edema.  Peripheral pulses are palpable. Psych: Alert, awake and oriented, normal mood CNS:  No cranial nerve deficits.  Power equal in all extremities.   Skin: Warm and dry.  No rashes noted.  The results of significant diagnostics from this hospitalization (including imaging, microbiology, ancillary and laboratory) are listed below for reference.     Diagnostic Studies:   ECHOCARDIOGRAM COMPLETE Result Date: 03/19/2024    ECHOCARDIOGRAM REPORT   Patient Name:   [name] Date of Exam: 03/19/2024 Medical Rec #:  [id number]         Height:       61.0 in Accession #:    [id number]       Weight:       178.8 lb Date of Birth:  05-Apr-1956         BSA:          1.801 m Patient Age:    68 years         BP:           157/77 mmHg Patient Gender: F                HR:           96 bpm. Exam Location:  Inpatient Procedure: 2D Echo, Cardiac Doppler and Color Doppler (Both Spectral and Color            Flow Doppler were utilized during procedure). Indications:    Elevated Troponin  History:        Patient has prior history of Echocardiogram examinations, most  recent 05/29/2022. CHF, CAD, COPD; Risk Factors:Hypertension and                 Diabetes.  Sonographer:    Astrid Blamer Referring Phys: CAROLE N HALL IMPRESSIONS  1. Left ventricular ejection fraction, by estimation, is 70 to 75%. The left ventricle has hyperdynamic function. The left ventricle has no regional wall motion abnormalities. There is moderate concentric left ventricular hypertrophy. Left ventricular diastolic parameters are consistent with Grade I diastolic dysfunction (impaired relaxation).  2. Right ventricular systolic function is normal. The right ventricular size is normal.  3. There is no evidence of cardiac tamponade.  4. The mitral valve is normal in structure. Trivial mitral valve regurgitation. No evidence of mitral stenosis.  5. The aortic valve is tricuspid. There is mild calcification of the aortic valve. Aortic valve regurgitation is not visualized. Aortic valve sclerosis/calcification is present, without any evidence of aortic stenosis.  6. The inferior vena cava is dilated in size with >50% respiratory variability, suggesting right atrial pressure of 8 mmHg. FINDINGS  Left Ventricle: Left ventricular ejection fraction, by estimation, is 70 to 75%. The left ventricle has hyperdynamic function. The left ventricle has no regional wall motion abnormalities. The left ventricular internal cavity size was normal in size. There is moderate concentric left ventricular  hypertrophy. Left ventricular diastolic parameters are consistent with Grade I diastolic dysfunction (impaired relaxation). Right Ventricle: The right ventricular size is normal. No increase in right ventricular wall thickness. Right ventricular systolic function is normal. Left Atrium: Left atrial size was normal in size. Right Atrium: Right atrial size was normal in size. Pericardium: Trivial pericardial effusion is present. The pericardial effusion is anterior to the right ventricle. There is no evidence of cardiac tamponade. Mitral Valve: The mitral valve is normal in structure. Trivial mitral valve regurgitation. No evidence of mitral valve stenosis. Tricuspid Valve: The tricuspid valve is normal in structure. Tricuspid valve regurgitation is trivial. No evidence of tricuspid stenosis. Aortic Valve: The aortic valve is tricuspid. There is mild calcification of the aortic valve. Aortic valve regurgitation is not visualized. Aortic valve sclerosis/calcification is present, without any evidence of aortic stenosis. Aortic valve mean gradient measures 4.0 mmHg. Aortic valve peak gradient measures 10.4 mmHg. Aortic valve area, by VTI measures 2.80 cm. Pulmonic Valve: The pulmonic valve was normal in structure. Pulmonic valve regurgitation is not visualized. No evidence of pulmonic stenosis. Aorta: The aortic root is normal in size and structure. Venous: The inferior vena cava is dilated in size with greater than 50% respiratory variability, suggesting right atrial pressure of 8 mmHg. IAS/Shunts: No atrial level shunt detected by color flow Doppler.  LEFT VENTRICLE PLAX 2D LVIDd:         3.90 cm   Diastology LVIDs:         2.40 cm   LV e' medial:    9.68 cm/s LV PW:         0.90 cm   LV E/e' medial:  9.4 LV IVS:        0.80 cm   LV e' lateral:   11.00 cm/s LVOT diam:     1.80 cm   LV E/e' lateral: 8.2 LV SV:         55 LV SV Index:   31 LVOT Area:     2.54 cm  RIGHT VENTRICLE             IVC RV S prime:     11.00  cm/s  IVC  diam: 2.20 cm LEFT ATRIUM             Index        RIGHT ATRIUM          Index LA Vol (A2C):   22.8 ml 12.66 ml/m  RA Area:     8.80 cm LA Vol (A4C):   25.4 ml 14.10 ml/m  RA Volume:   15.00 ml 8.33 ml/m LA Biplane Vol: 26.2 ml 14.55 ml/m  AORTIC VALVE AV Area (Vmax):    2.26 cm AV Area (Vmean):   2.52 cm AV Area (VTI):     2.80 cm AV Vmax:           161.00 cm/s AV Vmean:          89.800 cm/s AV VTI:            0.198 m AV Peak Grad:      10.4 mmHg AV Mean Grad:      4.0 mmHg LVOT Vmax:         143.00 cm/s LVOT Vmean:        89.000 cm/s LVOT VTI:          0.218 m LVOT/AV VTI ratio: 1.10  AORTA Ao Root diam: 2.10 cm MITRAL VALVE MV Area (PHT): 3.93 cm    SHUNTS MV Decel Time: 193 msec    Systemic VTI:  0.22 m MV E velocity: 90.70 cm/s  Systemic Diam: 1.80 cm MV A velocity: 96.40 cm/s MV E/A ratio:  0.94 Jules Oar MD Electronically signed by Jules Oar MD Signature Date/Time: 03/19/2024/5:59:29 PM    Final    DG Chest 2 View Result Date: 03/18/2024 CLINICAL DATA:  Shortness of breath. EXAM: CHEST - 2 VIEW COMPARISON:  Chest radiograph dated 03/15/2024. FINDINGS: Mild cardiomegaly with mild central vascular congestion. No focal consolidation, pleural effusion pneumothorax. Median sternotomy wires and CABG vascular clips. No acute osseous pathology. IMPRESSION: Mild cardiomegaly with mild central vascular congestion. Electronically Signed   By: Angus Bark M.D.   On: 03/18/2024 17:32     Labs:   Basic Metabolic Panel: Recent Labs  Lab 03/17/24 0231 03/18/24 1624 03/19/24 0515 03/20/24 0237 03/21/24 0235 03/22/24 0236 03/23/24 0253  NA 141   < > 143 141 139 139 140  K 5.1   < > 4.1 4.6 5.0 4.3 4.3  CL 92*   < > 91* 87* 88* 86* 89*  CO2 36*   < > 42* 44* 41* 42* 42*  GLUCOSE 185*   < > 142* 187* 177* 208* 186*  BUN 26*   < > 18 22 24* 25* 22  CREATININE 0.62   < > 0.55 0.74 0.73 0.73 0.62  CALCIUM  9.5   < > 9.6 9.4 9.3 9.3 9.4  MG 2.5*  --  2.0 2.6* 2.5* 2.5*   --   PHOS 4.4  --  2.7  --   --   --   --    < > = values in this interval not displayed.   GFR Estimated Creatinine Clearance: 64.5 mL/min (by C-G formula based on SCr of 0.62 mg/dL). Liver Function Tests: Recent Labs  Lab 03/17/24 0231  ALBUMIN 3.7   No results for input(s): "LIPASE", "AMYLASE" in the last 168 hours. No results for input(s): "AMMONIA" in the last 168 hours. Coagulation profile No results for input(s): "INR", "PROTIME" in the last 168 hours.  CBC: Recent Labs  Lab 03/18/24 1624 03/18/24 1917 03/19/24 0515 03/20/24 4098 03/21/24 0235  03/22/24 0236  WBC 9.8  --  11.8* 8.0 10.9* 11.5*  HGB 14.3 15.3* 14.4 14.3 13.9 13.9  HCT 47.7* 45.0 47.6* 46.1* 45.4 44.9  MCV 92.8  --  91.7 89.7 89.7 88.7  PLT 216  --  215 224 213 213   Cardiac Enzymes: No results for input(s): "CKTOTAL", "CKMB", "CKMBINDEX", "TROPONINI" in the last 168 hours. BNP: Invalid input(s): "POCBNP" CBG: Recent Labs  Lab 03/22/24 1206 03/22/24 1633 03/22/24 2127 03/22/24 2345 03/23/24 0541  GLUCAP 219* 207* 433* 271* 140*   D-Dimer No results for input(s): "DDIMER" in the last 72 hours. Hgb A1c No results for input(s): "HGBA1C" in the last 72 hours. Lipid Profile No results for input(s): "CHOL", "HDL", "LDLCALC", "TRIG", "CHOLHDL", "LDLDIRECT" in the last 72 hours. Thyroid  function studies No results for input(s): "TSH", "T4TOTAL", "T3FREE", "THYROIDAB" in the last 72 hours.  Invalid input(s): "FREET3" Anemia work up No results for input(s): "VITAMINB12", "FOLATE", "FERRITIN", "TIBC", "IRON", "RETICCTPCT" in the last 72 hours. Microbiology Recent Results (from the past 240 hours)  Resp panel by RT-PCR (RSV, Flu A&B, Covid) Anterior Nasal Swab     Status: None   Collection Time: 03/15/24  4:59 AM   Specimen: Anterior Nasal Swab  Result Value Ref Range Status   SARS Coronavirus 2 by RT PCR NEGATIVE NEGATIVE Final   Influenza A by PCR NEGATIVE NEGATIVE Final   Influenza B by  PCR NEGATIVE NEGATIVE Final    Comment: (NOTE) The Xpert Xpress SARS-CoV-2/FLU/RSV plus assay is intended as an aid in the diagnosis of influenza from Nasopharyngeal swab specimens and should not be used as a sole basis for treatment. Nasal washings and aspirates are unacceptable for Xpert Xpress SARS-CoV-2/FLU/RSV testing.  Fact Sheet for Patients: BloggerCourse.com  Fact Sheet for Healthcare Providers: SeriousBroker.it  This test is not yet approved or cleared by the [place]  FDA and has been authorized for detection and/or diagnosis of SARS-CoV-2 by FDA under an Emergency Use Authorization (EUA). This EUA will remain in effect (meaning this test can be used) for the duration of the COVID-19 declaration under Section 564(b)(1) of the Act, 21 U.S.C. section 360bbb-3(b)(1), unless the authorization is terminated or revoked.     Resp Syncytial Virus by PCR NEGATIVE NEGATIVE Final    Comment: (NOTE) Fact Sheet for Patients: BloggerCourse.com  Fact Sheet for Healthcare Providers: SeriousBroker.it  This test is not yet approved or cleared by the [place]  FDA and has been authorized for detection and/or diagnosis of SARS-CoV-2 by FDA under an Emergency Use Authorization (EUA). This EUA will remain in effect (meaning this test can be used) for the duration of the COVID-19 declaration under Section 564(b)(1) of the Act, 21 U.S.C. section 360bbb-3(b)(1), unless the authorization is terminated or revoked.  Performed at [facility], [address] [place], [place] [address]   Respiratory (~20 pathogens) panel by PCR     Status: None   Collection Time: 03/15/24  4:59 AM   Specimen: Nasopharyngeal Swab; Respiratory  Result Value Ref Range Status   Adenovirus NOT DETECTED NOT DETECTED Final   Coronavirus 229E NOT DETECTED NOT DETECTED Final    Comment: (NOTE) The  Coronavirus on the Respiratory Panel, DOES NOT test for the novel  Coronavirus (2019 nCoV)    Coronavirus HKU1 NOT DETECTED NOT DETECTED Final   Coronavirus NL63 NOT DETECTED NOT DETECTED Final   Coronavirus OC43 NOT DETECTED NOT DETECTED Final   Metapneumovirus NOT DETECTED NOT DETECTED Final   Rhinovirus / Enterovirus  NOT DETECTED NOT DETECTED Final   Influenza A NOT DETECTED NOT DETECTED Final   Influenza B NOT DETECTED NOT DETECTED Final   Parainfluenza Virus 1 NOT DETECTED NOT DETECTED Final   Parainfluenza Virus 2 NOT DETECTED NOT DETECTED Final   Parainfluenza Virus 3 NOT DETECTED NOT DETECTED Final   Parainfluenza Virus 4 NOT DETECTED NOT DETECTED Final   Respiratory Syncytial Virus NOT DETECTED NOT DETECTED Final   Bordetella pertussis NOT DETECTED NOT DETECTED Final   Bordetella Parapertussis NOT DETECTED NOT DETECTED Final   Chlamydophila pneumoniae NOT DETECTED NOT DETECTED Final   Mycoplasma pneumoniae NOT DETECTED NOT DETECTED Final    Comment: Performed at [facility], [address] [place], [place] [address]  MRSA Next Gen by PCR, Nasal     Status: None   Collection Time: 03/19/24 12:16 AM   Specimen: Nasal Mucosa; Nasal Swab  Result Value Ref Range Status   MRSA by PCR Next Gen NOT DETECTED NOT DETECTED Final    Comment: (NOTE) The GeneXpert MRSA Assay (FDA approved for NASAL specimens only), is one component of a comprehensive MRSA colonization surveillance program. It is not intended to diagnose MRSA infection nor to guide or monitor treatment for MRSA infections. Test performance is not FDA approved in patients less than 70 years old. Performed at [facility], [address] [place], [place] [address]      Discharge Instructions:   Discharge Instructions     Diet - low sodium heart healthy   Complete by: As directed    Discharge instructions   Complete by: As directed    Follow-up with your primary care provider in 1 week.  Follow-up  with Dr. Villa Greaser pulmonary as has been scheduled.  Please continue to use BiPAP at nighttime and during napping.  Continue oxygen  at home.  Okay to take nebulizers 3-4 times a day.  Take medications as prescribed.  Continue steroid taper with insulin .  Restart your prednisone  once you are done with taper.  No overexertion. Seek medical attention for worsening symptoms.  Follow-up with palliative care as outpatient.   Increase activity slowly   Complete by: As directed       Allergies as of 03/23/2024       Reactions   Chantix  [varenicline ] Nausea And Vomiting   Augmentin  [amoxicillin -pot Clavulanate] Nausea And Vomiting        Medication List     STOP taking these medications    furosemide  20 MG tablet Commonly known as: LASIX    levofloxacin  500 MG tablet Commonly known as: LEVAQUIN        TAKE these medications    acetaminophen  500 MG tablet Commonly known as: TYLENOL  Take 1,000 mg by mouth 2 (two) times daily as needed for headache, fever, moderate pain or mild pain.   albuterol  108 (90 Base) MCG/ACT inhaler Commonly known as: VENTOLIN  HFA INHALE 1-2 PUFFS BY MOUTH EVERY 6 HOURS AS NEEDED FOR WHEEZE OR SHORTNESS OF BREATH   alendronate  70 MG tablet Commonly known as: FOSAMAX  Take 70 mg by mouth once a week.   amLODipine  5 MG tablet Commonly known as: NORVASC  TAKE 1 TABLET (5 MG TOTAL) BY MOUTH DAILY.   atorvastatin  40 MG tablet Commonly known as: LIPITOR Take 1 tablet (40 mg total) by mouth daily.   bisoprolol  5 MG tablet Commonly known as: ZEBETA  TAKE 1 TABLET (5 MG TOTAL) BY MOUTH DAILY.   Dupixent  300 MG/2ML Soaj Generic drug: Dupilumab  Inject 300 mg into the  skin every 14 (fourteen) days.   fluticasone  50 MCG/ACT nasal spray Commonly known as: FLONASE  Place 1 spray into both nostrils daily.   guaiFENesin -dextromethorphan  100-10 MG/5ML syrup Commonly known as: ROBITUSSIN DM Take 5 mLs by mouth every 4 (four) hours as needed for cough.   Insupen Pen  Needles 31G X 8 MM Misc Generic drug: Insulin  Pen Needle Use as directed with insulin  pens   ipratropium-albuterol  0.5-2.5 (3) MG/3ML Soln Commonly known as: DUONEB INHALE 3 ML BY NEBULIZER EVERY 6 HOURS AS NEEDED   isosorbide  mononitrate 60 MG 24 hr tablet Commonly known as: IMDUR  TAKE 1.5 TABLETS (90 MG TOTAL) BY MOUTH DAILY.   Klor-Con  M10 10 MEQ tablet Generic drug: potassium chloride  TAKE 1 TABLET BY MOUTH EVERY DAY   MAGNESIUM  OXIDE PO Take 1 tablet by mouth at bedtime.   metFORMIN  500 MG 24 hr tablet Commonly known as: GLUCOPHAGE -XR TAKE 2 TABLET BY MOUTH EVERY DAY WITH BREAKFAST   Multivitamin Women 50+ Tabs Take 1 tablet by mouth in the morning.   NovoLOG  FlexPen 100 UNIT/ML FlexPen Generic drug: insulin  aspart Three times daily for meals. CBG 70 - 120: 0 units CBG 121 - 150: 3 units CBG 151 - 200: 4 units CBG 201 - 250: 7 units CBG 251 - 300: 11 units CBG 301 - 350: 15 units CBG 351 - 400: 20 units   OXYGEN  Inhale 3 L into the lungs continuous.   predniSONE  10 MG tablet Commonly known as: DELTASONE  Take 6 tablets (60 mg) daily for 2 days, then, Take 5 tablets (50 mg) daily for 2 days, then, Take 4 tablets (40 mg) daily for 2 days, then, Take 3 tablets (30 mg) daily for 2 days, then Take 2 tablets (20mg ) daily for 2 days then start your home dose of once a day What changed: You were already taking a medication with the same name, and this prescription was added. Make sure you understand how and when to take each.   predniSONE  10 MG tablet Commonly known as: DELTASONE  Take 1 tablet (10 mg total) by mouth daily with breakfast. After completing taper What changed:  how much to take how to take this when to take this additional instructions   torsemide 20 MG tablet Commonly known as: DEMADEX Take 1 tablet (20 mg total) by mouth daily.   Trelegy Ellipta  200-62.5-25 MCG/ACT Aepb Generic drug: Fluticasone -Umeclidin-Vilant INHALE 1 PUFF BY MOUTH EVERY DAY    Trulicity  3 MG/0.5ML Soaj Generic drug: Dulaglutide  Inject 3 mg as directed once a week. What changed: how much to take   VITAMIN B-12 PO Take 1 tablet by mouth in the morning.   VITAMIN D -3 PO Take 1 capsule by mouth in the morning.        Follow-up Information     [facility] Follow up.   Specialty: Home Health Services Why: Home health has been arranged. They  will contact you to schedule apt within 48hrs post discharge. Contact information: [address] [place] [address] [phone]         [facility] Follow up.   Why: Palliative will reach out to you to schedule apt. Contact information: [address] [place]  [address] [phone]        Roslyn Coombe, MD Follow up in 1 week(s).   Specialties: Internal Medicine, Radiology Why: June 6 ,2025 @11AM  Contact information: [address] [place] [address] [phone]  Lind Repine, MD. Go to.   Specialty: Pulmonary Disease Why: as scheduled by the clinic Contact information: [address] [place] [address] [phone]                  Time coordinating discharge: 39 minutes  Signed:  Cintya Daughety  [facility] 03/23/2024, 1:56 PM

## 2024-03-23 NOTE — Inpatient Diabetes Management (Signed)
 Inpatient Diabetes Program Recommendations  AACE/ADA: New Consensus Statement on Inpatient Glycemic Control (2015)  Target Ranges:  Prepandial:   less than 140 mg/dL      Peak postprandial:   less than 180 mg/dL (1-2 hours)      Critically ill patients:  140 - 180 mg/dL   Lab Results  Component Value Date   GLUCAP 140 (H) 03/23/2024   HGBA1C 7.4 (H) 03/11/2024    Review of Glycemic Control  Latest Reference Range & Units 03/21/24 06:16 03/21/24 11:40 03/21/24 16:11 03/21/24 21:47 03/22/24 06:22 03/22/24 12:06 03/22/24 16:33 03/22/24 21:27 03/22/24 23:45 03/23/24 05:41  Glucose-Capillary 70 - 99 mg/dL 161 (H) 096 (H) 045 (H) 299 (H) 177 (H) 219 (H) 207 (H) 433 (H) 271 (H) 140 (H)   Diabetes history: DM 2 Outpatient Diabetes medications: Trulicity  1.5 mg weekly, Metformin  1 gm daily Current orders for Inpatient glycemic control: Novolog  0-20 units tid, 0-5 units hs correction, Solumedrol 120 mg daily  Inpatient Diabetes Program Recommendations:    Spoke with patient and her family regarding insulin  for home use.  Of note, patient is going home on a steroid taper.  Discussed use of Novolog  correction scale and to check blood sugars prior to eating.  Family members present and participated in teaching.  Educated patient on insulin  pen use at home.  Reviewed contents of insulin  flexpen starter kit.  Reviewed all steps if insulin  pen including attachment of needle, 2-unit air shot, dialing up dose, giving injection, removing needle, disposal of sharps, storage of unused insulin , disposal of insulin  etc.  Patient able to provide successful return demonstration.  Also reviewed troubleshooting with insulin  pen.  MD to give patient Rxs for insulin  pens and insulin  pen needles.   Reviewed basic diet information with family and importance of taking medications as ordered. Also reviewed hypoglycemia signs, symptoms and treatment.  Patient and family verbalized understanding.   Thanks,  Josefa Ni, RN, BC-ADM Inpatient Diabetes Coordinator Pager [phone]  (8a-5p)

## 2024-03-23 NOTE — Consult Note (Signed)
 NAME:  [name], MRN:  [id number], DOB:  1956-09-30, LOS: 5 ADMISSION DATE:  03/18/2024, CONSULTATION DATE:  03/23/2024   REFERRING MD:  Efrain Grant, TRH, CHIEF COMPLAINT: Shortness of breath  History of Present Illness:  68 year old smoker with severe COPD and mild tracheomalacia with chronic hypoxic & hypercarbic resp failure on o2 since 2018 , whom I follow in pulmonary clinic.  Last FEV1 in 2020 was noted to be 44% predicted/1.05.  She has been maintained on nocturnal NIV since 2024 and is on chronic prednisone  10 mg 11/19/23 started dupixent  300 mg SQ q 2 weeks Hosp 11/2023 acute on chronic hypoxic/hypercapnic respiratory failure due to COPD exacerbation due to coronavirus infection.  I last saw her in clinic 12/2023 and we plan to continue prednisone  She was hospitalized 03/15/2024 for 2 days.  She had just been treated with Levaquin  and prednisone  as an outpatient for exacerbation.  She required transient BiPAP, BNP was 53, she was to start 5/28 Came back to ER on 5/29 for shortness of breath, orthopnea and hypoxia to 80s on 3 L nasal cannula.  Chest x-ray showed cardiomegaly with pulmonary vascular congestion, improved with Lasix  VBG was 7.33/91, improved to 7.47/73, PCCM consulted for recurrent admission  Pertinent  Medical History  CABG HFpEF  Significant Hospital Events: Including procedures, antibiotic start and stop dates in addition to other pertinent events     Interim History / Subjective:  Plan for his to be discharged today March 23, 2024  Objective    Blood pressure (!) 126/45, pulse (!) 51, temperature 98.4 F (36.9 C), temperature source Oral, resp. rate 16, height 5\' 1"  (1.549 m), weight 80.1 kg, SpO2 95%.    Vent Mode: PSV;BIPAP FiO2 (%):  [35 %] 35 % Set Rate:  [16 bmp] 16 bmp PEEP:  [5 cmH20] 5 cmH20 Pressure Support:  [7 cmH20] 7 cmH20   Intake/Output Summary (Last 24 hours) at 03/23/2024 4098 Last data filed at 03/23/2024 0900 Gross per 24 hour  Intake 1260  ml  Output 900 ml  Net 360 ml   Filed Weights   03/20/24 0350 03/22/24 0500 03/23/24 0547  Weight: 79.7 kg 80.6 kg 80.1 kg    Examination: Awake alert no acute distress Diminished breath sounds throughout Heart sounds are distant Abdomen obese and soft Lower extremities with mild edema     Resolved problem list   Assessment and Plan    Acute on chronic hypoxic and hypercarbic respiratory failure CopD exacerbation Acute cor pulmonale  Cause for readmission is not clear, likely previous episode of COPD exacerbation was not fully resolved, she does not have left heart failure but chest x-ray did show some pulmonary vascular congestion.  BNP is normal, repeat echo is pending  Nearing end-stage COPD 2D echo was unremarkable She is currently on steroid taper Currently on bronchodilators Utilizes nocturnal BiPAP She has an appointment with Dr. Villa Greaser 04/03/2024 Sliding scale insulin  for steroid-induced hyperglycemia  Best Practice (right click and "Reselect all SmartList Selections" daily)    Code Status:  full code Last date of multidisciplinary goals of care discussion [NA]  Labs   CBC: Recent Labs  Lab 03/18/24 1624 03/18/24 1917 03/19/24 0515 03/20/24 0237 03/21/24 0235 03/22/24 0236  WBC 9.8  --  11.8* 8.0 10.9* 11.5*  HGB 14.3 15.3* 14.4 14.3 13.9 13.9  HCT 47.7* 45.0 47.6* 46.1* 45.4 44.9  MCV 92.8  --  91.7 89.7 89.7 88.7  PLT 216  --  215 224 213 213  Basic Metabolic Panel: Recent Labs  Lab 03/17/24 0231 03/18/24 1624 03/19/24 0515 03/20/24 0237 03/21/24 0235 03/22/24 0236 03/23/24 0253  NA 141   < > 143 141 139 139 140  K 5.1   < > 4.1 4.6 5.0 4.3 4.3  CL 92*   < > 91* 87* 88* 86* 89*  CO2 36*   < > 42* 44* 41* 42* 42*  GLUCOSE 185*   < > 142* 187* 177* 208* 186*  BUN 26*   < > 18 22 24* 25* 22  CREATININE 0.62   < > 0.55 0.74 0.73 0.73 0.62  CALCIUM  9.5   < > 9.6 9.4 9.3 9.3 9.4  MG 2.5*  --  2.0 2.6* 2.5* 2.5*  --   PHOS 4.4  --  2.7   --   --   --   --    < > = values in this interval not displayed.   GFR: Estimated Creatinine Clearance: 64.5 mL/min (by C-G formula based on SCr of 0.62 mg/dL). Recent Labs  Lab 03/19/24 0515 03/20/24 0237 03/21/24 0235 03/22/24 0236  WBC 11.8* 8.0 10.9* 11.5*    Liver Function Tests: Recent Labs  Lab 03/17/24 0231  ALBUMIN 3.7   No results for input(s): "LIPASE", "AMYLASE" in the last 168 hours. No results for input(s): "AMMONIA" in the last 168 hours.  ABG    Component Value Date/Time   PHART 7.321 (L) 11/07/2022 1228   PCO2ART 81.9 (HH) 11/07/2022 1228   PO2ART 69 (L) 11/07/2022 1228   HCO3 55.6 (H) 03/23/2024 0255   TCO2 >50 (H) 03/18/2024 1917   O2SAT 86.8 03/23/2024 0255     Coagulation Profile: No results for input(s): "INR", "PROTIME" in the last 168 hours.  Cardiac Enzymes: No results for input(s): "CKTOTAL", "CKMB", "CKMBINDEX", "TROPONINI" in the last 168 hours.  HbA1C: Hgb A1c MFr Bld  Date/Time Value Ref Range Status  03/11/2024 10:31 AM 7.4 (H) 4.6 - 6.5 % Final    Comment:    Glycemic Control Guidelines for People with Diabetes:Non Diabetic:  <6%Goal of Therapy: <7%Additional Action Suggested:  >8%   08/29/2023 11:48 AM 7.7 (H) 4.6 - 6.5 % Final    Comment:    Glycemic Control Guidelines for People with Diabetes:Non Diabetic:  <6%Goal of Therapy: <7%Additional Action Suggested:  >8%     CBG: Recent Labs  Lab 03/22/24 1206 03/22/24 1633 03/22/24 2127 03/22/24 2345 03/23/24 0541  GLUCAP 219* 207* 433* 271* 140*     Steve Mariem Skolnick ACNP Acute Care Nurse Practitioner [facility] Pulmonary/Critical Care Please consult Amion 03/23/2024, 9:37 AM

## 2024-03-23 NOTE — Progress Notes (Signed)
 OT Cancellation Note  Patient Details Name: [name] MRN: [id number] DOB: [birth year]   Cancelled Treatment:    Reason Eval/Treat Not Completed: Patient declined, no reason specified (Patient declined OT due to anticipating discharge home.)  Jovita Nipper 03/23/2024, 11:27 AM  Anitra Barn, OTA Acute Rehabilitation Services  Office [phone]

## 2024-03-23 NOTE — Plan of Care (Signed)

## 2024-03-24 ENCOUNTER — Other Ambulatory Visit (HOSPITAL_COMMUNITY): Payer: Self-pay

## 2024-03-24 ENCOUNTER — Other Ambulatory Visit: Payer: Self-pay | Admitting: *Deleted

## 2024-03-24 ENCOUNTER — Telehealth: Payer: Self-pay | Admitting: *Deleted

## 2024-03-24 NOTE — Transitions of Care (Post Inpatient/ED Visit) (Signed)
 03/24/2024  Name: [name] MRN: [id number] DOB: 02-28-1956  Today's TOC FU Call Status: Today's TOC FU Call Status:: Successful TOC FU Call Completed TOC FU Call Complete Date: 03/24/24 Patient's Name and Date of Birth confirmed.  Transition Care Management Follow-up Telephone Call Date of Discharge: 03/23/24 Discharge Facility: [facility]) Type of Discharge: Inpatient Admission Primary Inpatient Discharge Diagnosis:: Acute on Chronic Respiratory Failure with hypoxia How have you been since you were released from the hospital?: Better ("I am doing fine now that they treated me and didn't just release me like they did the first visit.  Breathing okay and doing what I am supposed to; my sister is still here and can help if I need it, but right now, I am back to myself, just weak feeling") Any questions or concerns?: No  Items Reviewed: Did you receive and understand the discharge instructions provided?: Yes (thoroughly reviewed with patient who verbalizes good understanding of same) Medications obtained,verified, and reconciled?: Yes (Medications Reviewed) (Full medication reconciliation/ review completed; no concerns or discrepancies identified; confirmed patient obtained/ is taking all newly Rx'd medications as instructed; self-manages medications and denies questions/ concerns around medications today) Any new allergies since your discharge?: No Dietary orders reviewed?: Yes Type of Diet Ordered:: "I eat as healthy as I can" Do you have support at home?: Yes People in Home [RPT]: sibling(s) Name of Support/Comfort Primary Source: Reports essentially independent in self-care activities; resides with supportive sister: assists as/ if needed/ indicated  Medications Reviewed Today: Medications Reviewed Today     Reviewed by Dhillon Comunale M, RN (Registered Nurse) on 03/24/24 at 1645  Med List Status: <None>   Medication Order Taking? Sig Documenting Provider Last Dose Status  Informant  acetaminophen  (TYLENOL ) 500 MG tablet [id number] Yes Take 1,000 mg by mouth 2 (two) times daily as needed for headache, fever, moderate pain or mild pain. [provider] Taking Active Pharmacy Records, Self  albuterol  (VENTOLIN  HFA) 108 (90 Base) MCG/ACT inhaler [id number] Yes INHALE 1-2 PUFFS BY MOUTH EVERY 6 HOURS AS NEEDED FOR WHEEZE OR SHORTNESS OF Standley Earing, MD Taking Active Pharmacy Records, Self  alendronate  (FOSAMAX ) 70 MG tablet [id number] Yes Take 70 mg by mouth once a week. [provider] Taking Active Pharmacy Records, Self  amLODipine  (NORVASC ) 5 MG tablet [id number] Yes TAKE 1 TABLET (5 MG TOTAL) BY MOUTH DAILY. Hugh Madura, MD Taking Active Pharmacy Records, Self  atorvastatin  (LIPITOR) 40 MG tablet [id number] Yes Take 1 tablet (40 mg total) by mouth daily. Roslyn Coombe, MD Taking Active Pharmacy Records, Self  bisoprolol  (ZEBETA ) 5 MG tablet [id number] Yes TAKE 1 TABLET (5 MG TOTAL) BY MOUTH DAILY. Hugh Madura, MD Taking Active Pharmacy Records, Self  Cholecalciferol  (VITAMIN D -3 PO) [id number] Yes Take 1 capsule by mouth in the morning. [provider] Taking Active Pharmacy Records, Self  Cyanocobalamin  (VITAMIN B-12 PO) [id number] Yes Take 1 tablet by mouth in the morning. [provider] Taking Active Pharmacy Records, Self  Dulaglutide  (TRULICITY ) 3 MG/0.5ML SOAJ [id number] Yes Inject 3 mg as directed once a week.  Patient taking differently: Inject 1.5 mg as directed once a week.   Roslyn Coombe, MD Taking Active Pharmacy Records, Self  Dupilumab  (DUPIXENT ) 300 MG/2ML Stevens Eland [id number] No Inject 300 mg into the skin every 14 (fourteen) days.  Patient not taking: Reported on 03/24/2024   Alva, Rakesh V, MD Not Taking Active Pharmacy Records, Self  Med Note (Chanler Mendonca M   Wed Mar 24, 2024 11:18 AM) 03/24/24:  Reports during TOC call, pulmonary provider instructed her to hold this medication indefinitely- she is not  currently taking  fluticasone  (FLONASE ) 50 MCG/ACT nasal spray [id number] Yes Place 1 spray into both nostrils daily. Vernestine Gondola, PA-C Taking Active Pharmacy Records, Self  Fluticasone -Umeclidin-Vilant (TRELEGY ELLIPTA ) 200-62.5-25 MCG/ACT AEPB [id number] Yes INHALE 1 PUFF BY MOUTH EVERY DAY Lind Repine, MD Taking Active Pharmacy Records, Self  guaiFENesin -dextromethorphan  (ROBITUSSIN DM) 100-10 MG/5ML syrup [id number] Yes Take 5 mLs by mouth every 4 (four) hours as needed for cough. Pokhrel, Laxman, MD Taking Active   insulin  aspart (NOVOLOG  FLEXPEN) 100 UNIT/ML FlexPen [id number] Yes Three times daily for meals. CBG 70 - 120: 0 units CBG 121 - 150: 3 units CBG 151 - 200: 4 units CBG 201 - 250: 7 units CBG 251 - 300: 11 units CBG 301 - 350: 15 units CBG 351 - 400: 20 units Pokhrel, Laxman, MD Taking Active   Insulin  Pen Needle 31G X 8 MM MISC [id number] Yes Use as directed with insulin  pens Pokhrel, Laxman, MD Taking Active   ipratropium-albuterol  (DUONEB) 0.5-2.5 (3) MG/3ML SOLN [id number] Yes INHALE 3 ML BY NEBULIZER EVERY 6 HOURS AS NEEDED Lind Repine, MD Taking Active Pharmacy Records, Self  isosorbide  mononitrate (IMDUR ) 60 MG 24 hr tablet [id number] Yes TAKE 1.5 TABLETS (90 MG TOTAL) BY MOUTH DAILY. Hugh Madura, MD Taking Active Pharmacy Records, Self  KLOR-CON  M10 10 MEQ tablet [id number] Yes TAKE 1 TABLET BY MOUTH EVERY DAY Roslyn Coombe, MD Taking Active Pharmacy Records, Self  MAGNESIUM  OXIDE PO [id number] Yes Take 1 tablet by mouth at bedtime. [provider] Taking Active Pharmacy Records, Self  metFORMIN  (GLUCOPHAGE -XR) 500 MG 24 hr tablet [id number] Yes TAKE 2 TABLET BY MOUTH EVERY DAY WITH BREAKFAST John, James W, MD Taking Active Pharmacy Records, Self  Multiple Vitamins-Minerals (MULTIVITAMIN WOMEN 50+) TABS [id number] Yes Take 1 tablet by mouth in the morning. [provider] Taking Active Pharmacy Records, Self  OXYGEN  [id number] Yes Inhale 3 L into the lungs  continuous. [provider] Taking Active Self  predniSONE  (DELTASONE ) 10 MG tablet [id number] Yes Take 6 tablets (60 mg) daily for 2 days, then, Take 5 tablets (50 mg) daily for 2 days, then, Take 4 tablets (40 mg) daily for 2 days, then, Take 3 tablets (30 mg) daily for 2 days, then Take 2 tablets (20mg ) daily for 2 days then start your home dose of once a day Pokhrel, Laxman, MD Taking Active   predniSONE  (DELTASONE ) 10 MG tablet [id number] Yes Take 1 tablet (10 mg total) by mouth daily with breakfast. After completing taper Pokhrel, Laxman, MD Taking Active   torsemide (DEMADEX) 20 MG tablet [id number] Yes Take 1 tablet (20 mg total) by mouth daily. Rosena Conradi, MD Taking Active            Home Care and Equipment/Supplies: Were Home Health Services Ordered?: Yes Name of Home Health Agency:: [facility]: [phone]: reviewed discharge AVS line by line with  patient: she declines need of my assistance to call home health agency: states "they told me it would be 2 days before I heard from them; I can call them myself if I haven't heard from them by Friday" Has Agency set up a time to come to your home?: No EMR reviewed for Home Health Orders: Orders present/patient has not received call (refer to CM for follow-up) (Successfully enrolled into 30-day  TOC program) Any new equipment or medical supplies ordered?: No Name of Medical supply agency?: Confirmed no new DME ordered this hospital discharge: however- she has still not received rolling walker from 03/17/24 hospital discharge: she reports she contacted [facility] [phone] and was told the prior order had expired: provided education around having PCP write new order at time of hospital follow up office visit on 03/26/24: she verbalizes understanding and agreement: will also message PCP re: same  Functional Questionnaire: Do you need assistance with bathing/showering or dressing?: Yes (Reports sister provides assistance and supervision as  indicated) Do you need assistance with meal preparation?: No (patient prepares small meals; sisters assists as indicated) Do you need assistance with eating?: No Do you have difficulty maintaining continence: No Do you need assistance with getting out of bed/getting out of a chair/moving?: No Do you have difficulty managing or taking your medications?: No  Follow up appointments reviewed: PCP Follow-up appointment confirmed?: Yes Date of PCP follow-up appointment?: 03/26/24 Follow-up Provider: PCP- Dr. Autry Legions Specialist [facility] Follow-up appointment confirmed?: Yes Date of Specialist follow-up appointment?: 04/05/24 Follow-Up Specialty Provider:: pulmonary provider Do you need transportation to your follow-up appointment?: No Do you understand care options if your condition(s) worsen?: Yes-patient verbalized understanding  SDOH Interventions Today    Flowsheet Row Most Recent Value  SDOH Interventions   Food Insecurity Interventions Intervention Not Indicated  Housing Interventions Intervention Not Indicated  Transportation Interventions Intervention Not Indicated  [sister provides transportation]  Utilities Interventions Intervention Not Indicated      See TOC assessment tabs for additional assessment/ TOC intervention information  Plan for next week's call: Review provider office visit/ labs/ medication changes from 03/26/24: hospital follow up scheduled PCP Review medication adherence- ? Finished prednisone  taper and started maintenance (baseline) dose? Confirm [facility] home health active/ Review home health PT/ RN visits Confirm patient has heard from Palliative Care Team- ? First visit scheduled? Discuss smoking status with patient- ? Still not smoking since 03/18/24 ? Reinforce action plan for COPD: monitoring SaO2 and daily weights at home: review both/ use of nebulizers/ home O2/ rescue inhalers ? ? Resumed monitoring blood sugars at home/ use of sliding scale insulin   ?  Pls call/ message for questions,  Timmy Cleverly Mckinney Desia Saban, RN, BSN, CCRN Alumnus RN Care Manager  Transitions of Care  [facility] - [facility] [phone]: direct office

## 2024-03-24 NOTE — Patient Instructions (Signed)
 Visit Information  Thank you for taking time to visit with me today. Please don't hesitate to contact me if I can be of assistance to you before our next scheduled telephone appointment.  Our next appointment is by telephone on Thursday 04/01/24 at 10:00 am with nurse Barbie and Tuesday 04/06/24 at 10:00 am with nurse Tawny Fate  Please call the care guide team at [phone] if you need to cancel or reschedule your appointment.   Patient Self Care Activities:  Attend all scheduled provider appointments Call provider office for new concerns or questions  Participate in Transition of Care Program/Attend TOC scheduled calls Take medications as prescribed   Great job not smoking since you have come home from the hospital-- Please continue NOT SMOKING- this is the most important thing you can do to improve your health and your breathing Continue pacing activity to avoid episodes of shortness of breath Continue using home oxygen  as prescribed- continue monitoring your oxygen  levels at home frequently Continue to follow your established action plan for episodes of shortness of breath- seek emergency care if you feel your condition is worsening and you are struggling to breathe Continue working with the home health team that is involved in your care once they make their first home visit If you believe your condition is getting worse- contact your care providers (doctors) promptly- reaching out to your doctor early when you have concerns can prevent you from having to go to the hospital Please update the release form at your PCP office to reflect your sister's proper name as we discussed today Please ask Dr. Autry Legions to order you a new rolling walker when you visit him on 03/26/24 Please talk to your doctors regularly about palliative and/ or hospice care options  Following is a copy of your care plan:   Goals Addressed             This Visit's Progress    [facility] Transitions of Care (TOC) Care Plan   On  track    Problems:  Recent Hospitalizations- 2 within 30 days of each other- both for treatment of COPD: Acute on Chronic Respiratory Failure in setting of ongoing smoking 03/24/24: patient reports she has not smoked since her last hospital admission on 03/18/24: positive reinforcement provided with encouragement to continue efforts- reinforced benefits of not smoking in setting of end-stage chronic respiratory failure Equipment/DME barrier rollator walker ordered at time of hospital discharge 03/17/24: patient has not yet received- verbalizes plans to discuss with PCP at time of hospital follow up office visit 03/26/24: currently reports not requiring use of assistive devices and Home Health services barrier: has not heard from home health team re: start of service since her most recent hospital discharge on  03/23/24: she confirms she has phone number and will call agency to schedule if she has not heard from them by "Friday;" confirmed she has contact information for agency Recent hospital admission May 26-28, 2025 for acute on chronic respiratory failure in setting of chronic home O2- patient declined hospice services at time of hospitalization/ hospital discharge Readmission less than 24-hours post 03/17/24 discharge:  May 29-March 23, 2024: Acute on Chronic Respiratory Failure/ end-stage chronic respiratory failure Fragile state of health, multiple progressing chronic health conditions- ongoing tobacco use- 03/24/24: as above, states has not smoked since 03/18/24- verbalizes plans to continue not smoking: this was encouraged  3 unplanned hospital admissions x last 6/ 12 months- all for acute on chronic respiratory failure  Goal:  Over the next  30 days, the patient will not experience hospital readmission  Interventions:  Transitions of Care:  03/24/24 week # 1/ day # 1: re-enrolled into TOC 30-day program Durable Medical Equipment (DME) needs assessed with patient/caregiver Doctor Visits  - discussed the  importance of doctor visits Communication with PCP re: successful enrollment in [facility] 30-day program; patient report of needing order for rolling walker post-hospital discharge on 03/17/24- she still has not received; currently not requiring assistive devices: however, reports being "weak; need to have it when I do need it;" Contacted provider for patient needs DME as above: patient states she herself contacted [employer] today and they confirmed she needs new order placed- PCP made aware as above Post discharge activity limitations prescribed by provider reviewed Confirmed no new DME ordered this hospital discharge: however- she has still not received rolling walker from 03/17/24 hospital discharge: she reports she contacted [employer] [phone] and was told the prior order had expired: provided education around having PCP write new order at time of hospital follow up office visit on 03/26/24: she verbalizes understanding and agreement: will also message PCP re: same  Reports baseline shortness of breath- states "breathing much better now" post-hospital discharge; confirms using nebulizer, rescue/ maintenance inhalers, home O2 as instructed and as per baseline; confirms she has "not smoked" since hospital re-admission on 03/18/24; sounds to be in no respiratory distress throughout [facility] [place] call today; provided education/ reinforcement around action plan for shortness of breath, benefits of not smoking; safe use of home O2- patient verbalizes good understanding of same with minimal prompting  Confirmed patient monitoring/ recording daily weights at home, as per baseline: reinforced rationale for daily weight monitoring at home along with weight gain guidelines/ action plan for weight gain; importance of taking diuretic as prescribed  Provided education around need to balance efforts to prevent fluid overload along with efforts to ensure adequate fluid intake to prevent dehydration: encouraged patient to discuss how much  daily fluid intake she should follow as a goal, with her providers at upcoming scheduled provider appointments  Reviewed upcoming provider office visits: 03/26/24- PCP and 04/05/24- pulmonary provider- both for hospital follow up office visits:  confirmed patient is aware of all and has plans to attend as scheduled Purpose of/ and importance of having updated DPR completed provided education around process of updating DPR encouraged her to update at time of upcoming PCP appointment on 03/26/24: patient reports current DPR is "listed wrong;" states her sister's name is "Milicent, not Media Spikes;" encouraged her to update this form accoridngly Confirms continuing to use home O2 as per hospital discharge instructions: 2- 4 L/min; reviewed recent home SaO2 readings at home post-hospital discharge: reports "between "88%- 90%" with ongoing frequent checks at home Provided education/ reinforcement around basic safe use of home O2- need to refrain from smoking around home O2 Confirmed/ reinforced action plan for shortness of breath: nebulizer solution, home O2, pursed lip breathing, conservation of energy, rescue and maintenance inhaler- sister/ caregiver verbalizes good understanding/ adherence to same Provided education around signs of hypoxia/ hypercarbia along with action plan for same  Reinforced need for patient to stay hydrated along with signs/ symptoms dehydration and corresponding action plan Discussed options for care if patient/ caregiver continues to worsen clinically/ feels medical evaluation needed prior to scheduled PCP provider office visit currently scheduled on 03/26/24 for hospital follow up Again discussed need for goals of care discussion as per hospital discharge notes: provided education around difference between palliative care and hospice services/ role of  both: encouraged patient and caregiver to fully engage with palliative care team ([facility]) as per hospital discharge referral on 03/23/24,  once outreach is established Successfully enrolled into 30-day TOC program; provided my direct contact information should questions/ concerns/ needs arise post-TOC initial call, prior to next TOC 30-day program RN CM telephone visit     COPD Interventions: Advised patient to track and manage COPD triggers Assessed social determinant of health barriers Discussed the importance of adequate rest and management of fatigue with COPD Provided instruction about proper use of medications used for management of COPD including inhalers Use of home oxygen   Patient Self Care Activities:  Attend all scheduled provider appointments Call provider office for new concerns or questions  Participate in Transition of Care Program/Attend TOC scheduled calls Take medications as prescribed   Great job not smoking since you have come home from the hospital-- Please continue NOT SMOKING- this is the most important thing you can do to improve your health and your breathing Continue pacing activity to avoid episodes of shortness of breath Continue using home oxygen  as prescribed- continue monitoring your oxygen  levels at home frequently Continue to follow your established action plan for episodes of shortness of breath- seek emergency care if you feel your condition is worsening and you are struggling to breathe Continue working with the home health team that is involved in your care once they make their first home visit If you believe your condition is getting worse- contact your care providers (doctors) promptly- reaching out to your doctor early when you have concerns can prevent you from having to go to the hospital Please update the release form at your PCP office to reflect your sister's proper name as we discussed today Please ask Dr. Autry Legions to order you a new rolling walker when you visit him on 03/26/24 Please talk to your doctors regularly about palliative and/ or hospice care options  Plan:  Telephone follow  up appointment with care management team member scheduled for:  Thursday 04/01/24 at 10:00 am, and then Tuesday 04/06/24 at 10:00 am  Plan for next week's call: Review provider office visit/ labs/ medication changes from 03/26/24: hospital follow up scheduled PCP Review medication adherence- ? Finished prednisone  taper and started maintenance (baseline) dose? Confirm [facility] home health active/ Review home health PT/ RN visits Confirm patient has heard from Palliative Care Team- ? First visit scheduled? Discuss smoking status with patient- ? Still not smoking since 03/18/24 ? Reinforce action plan for COPD: monitoring SaO2 and daily weights at home: review both/ use of nebulizers/ home O2/ rescue inhalers ? ? Resumed monitoring blood sugars at home/ use of sliding scale insulin  ?         Patient verbalizes understanding of instructions and care plan provided today and agrees to view in MyChart. Active MyChart status and patient understanding of how to access instructions and care plan via MyChart confirmed with patient.     Telephone follow up appointment with care management team member scheduled for:  Thursday 04/01/24 at 10:00 am with nurse Barbie and Tuesday 04/06/24 at 10:00 am with nurse Marylynne Keelin  If you are experiencing a Mental Health or Behavioral Health Crisis or need someone to talk to, please  call the Suicide and Crisis Lifeline: 988 call the [place]  National Suicide Prevention Lifeline: 484-770-0132 or TTY: 509 010 6408 TTY (548) 116-6181) to talk to a trained counselor call 1-800-273-TALK (toll free, 24 hour hotline) go to [facility] [address], [place] [phone]) call  the [facility]: [phone] call 911   Erlene Hawks, RN, BSN, Media planner  Transitions of Care  [facility] - [facility] [phone]: direct office

## 2024-03-26 ENCOUNTER — Encounter: Payer: Self-pay | Admitting: Internal Medicine

## 2024-03-26 ENCOUNTER — Ambulatory Visit: Admitting: Internal Medicine

## 2024-03-26 VITALS — BP 124/70 | HR 77 | Temp 98.4°F | Ht 61.0 in | Wt 175.0 lb

## 2024-03-26 DIAGNOSIS — J4489 Other specified chronic obstructive pulmonary disease: Secondary | ICD-10-CM | POA: Diagnosis not present

## 2024-03-26 DIAGNOSIS — I10 Essential (primary) hypertension: Secondary | ICD-10-CM

## 2024-03-26 DIAGNOSIS — J9612 Chronic respiratory failure with hypercapnia: Secondary | ICD-10-CM

## 2024-03-26 DIAGNOSIS — J9611 Chronic respiratory failure with hypoxia: Secondary | ICD-10-CM

## 2024-03-26 DIAGNOSIS — I509 Heart failure, unspecified: Secondary | ICD-10-CM

## 2024-03-26 NOTE — Assessment & Plan Note (Addendum)
 With exacerbation resolved, to finish prednisone , cont home PT, f/u pulm next wk as planned

## 2024-03-26 NOTE — Assessment & Plan Note (Signed)
Volume stable, cont current med tx

## 2024-03-26 NOTE — Assessment & Plan Note (Signed)
 Stable cont home o2 3l continuous

## 2024-03-26 NOTE — Patient Instructions (Signed)
 Ok to finish the prednisone  as you have  Please continue all other medications as before, and refills have been done if requested.  Please have the pharmacy call with any other refills you may need.  Please keep your appointments with your specialists as you may have planned - pulmonary next week  Please make an Appointment to return in 6 WEEKS, or sooner if needed

## 2024-03-26 NOTE — Progress Notes (Signed)
 Patient ID: [name], female   DOB: 1956/06/16, 68 y.o.   MRN: [id number]        Chief Complaint: follow up repeat hospn may 29 - June 3 with acute on chronic resp failure, copd exacerbation,        HPI:  [name] is a 68 y.o. female here overall doing well, Pt denies chest pain, increased sob or doe, wheezing, orthopnea, PND, increased LE swelling, palpitations, dizziness or syncope.   Pt denies polydipsia, polyuria, or new focal neuro s/s.    Pt denies fever, wt loss, night sweats, loss of appetite, or other constitutional symptoms  Has some scant prod cough, cont 3L home o2 and bipap at bedtime.  Currently fnishing prednisone  taper .  Has pulm f/u in 1 week.  Lasix  change to torsemide related to severe hypercapnia.       Wt Readings from Last 3 Encounters:  03/26/24 175 lb (79.4 kg)  03/24/24 179 lb (81.2 kg)  03/23/24 176 lb 9.4 oz (80.1 kg)   BP Readings from Last 3 Encounters:  03/26/24 124/70  03/23/24 (!) 126/45  03/17/24 (!) 158/68         Past Medical History:  Diagnosis Date   Bronchitis    CAD (coronary artery disease)    a. 02/2002: CABG x3 with LIMA to LAD, RIMA to RCA, and SVG to OM   CHF (congestive heart failure) (HCC)    COPD (chronic obstructive pulmonary disease) (HCC)    Diabetes (HCC)    Headache    Heart disease    Hyperlipidemia    Hypertension    Hypertensive emergency 07/01/2013   Impaired glucose tolerance 08/20/2014   Sinusitis    Past Surgical History:  Procedure Laterality Date   CORONARY ARTERY BYPASS GRAFT  2003   Triple bypass   ESOPHAGOGASTRODUODENOSCOPY Left 06/13/2014   Procedure: ESOPHAGOGASTRODUODENOSCOPY (EGD);  Surgeon: Tami Falcon, MD;  Location[facility] ENDOSCOPY;  Service: Endoscopy;  Laterality: Left;   RIGID ESOPHAGOSCOPY N/A 06/12/2014   Procedure: RIGID ESOPHAGOSCOPY WITH FOREIGN BODY REMOVAL;  Surgeon: Lawence Press, MD;  Location: [facility] OR;  Service: ENT;  Laterality: N/A;    reports that she quit smoking about 4 years ago.  Her smoking use included cigarettes. She started smoking about 52 years ago. She has a 23.7 pack-year smoking history. She has never used smokeless tobacco. She reports current alcohol use of about 5.0 - 6.0 standard drinks of alcohol per week. She reports that she does not currently use drugs after having used the following drugs: Marijuana. family history includes Brain cancer in her mother; Cancer in her maternal grandmother and another family member; Heart disease in her mother; Lung cancer in her maternal aunt and paternal uncle; Stroke in an other family member; Suicidality in her brother. Allergies  Allergen Reactions   Chantix  [Varenicline ] Nausea And Vomiting   Augmentin  [Amoxicillin -Pot Clavulanate] Nausea And Vomiting   Current Outpatient Medications on File Prior to Visit  Medication Sig Dispense Refill   acetaminophen  (TYLENOL ) 500 MG tablet Take 1,000 mg by mouth 2 (two) times daily as needed for headache, fever, moderate pain or mild pain.     albuterol  (VENTOLIN  HFA) 108 (90 Base) MCG/ACT inhaler INHALE 1-2 PUFFS BY MOUTH EVERY 6 HOURS AS NEEDED FOR WHEEZE OR SHORTNESS OF BREATH 18 each 5   alendronate  (FOSAMAX ) 70 MG tablet Take 70 mg by mouth once a week.     amLODipine  (NORVASC ) 5 MG tablet TAKE 1 TABLET (5 MG  TOTAL) BY MOUTH DAILY. 90 tablet 3   atorvastatin  (LIPITOR) 40 MG tablet Take 1 tablet (40 mg total) by mouth daily. 90 tablet 3   bisoprolol  (ZEBETA ) 5 MG tablet TAKE 1 TABLET (5 MG TOTAL) BY MOUTH DAILY. 90 tablet 2   Cholecalciferol  (VITAMIN D -3 PO) Take 1 capsule by mouth in the morning.     Cyanocobalamin  (VITAMIN B-12 PO) Take 1 tablet by mouth in the morning.     Dulaglutide  (TRULICITY ) 3 MG/0.5ML SOAJ Inject 3 mg as directed once a week. (Patient taking differently: Inject 1.5 mg as directed once a week.) 6 mL 3   Dupilumab  (DUPIXENT ) 300 MG/2ML SOAJ Inject 300 mg into the skin every 14 (fourteen) days. 12 mL 1   fluticasone  (FLONASE ) 50 MCG/ACT nasal spray Place 1  spray into both nostrils daily. 15.8 mL 0   Fluticasone -Umeclidin-Vilant (TRELEGY ELLIPTA ) 200-62.5-25 MCG/ACT AEPB INHALE 1 PUFF BY MOUTH EVERY DAY 60 each 11   guaiFENesin -dextromethorphan  (ROBITUSSIN DM) 100-10 MG/5ML syrup Take 5 mLs by mouth every 4 (four) hours as needed for cough. 118 mL 0   insulin  aspart (NOVOLOG  FLEXPEN) 100 UNIT/ML FlexPen Three times daily for meals. CBG 70 - 120: 0 units CBG 121 - 150: 3 units CBG 151 - 200: 4 units CBG 201 - 250: 7 units CBG 251 - 300: 11 units CBG 301 - 350: 15 units CBG 351 - 400: 20 units 15 mL 0   Insulin  Pen Needle 31G X 8 MM MISC Use as directed with insulin  pens 100 each 0   ipratropium-albuterol  (DUONEB) 0.5-2.5 (3) MG/3ML SOLN INHALE 3 ML BY NEBULIZER EVERY 6 HOURS AS NEEDED 360 mL 11   isosorbide  mononitrate (IMDUR ) 60 MG 24 hr tablet TAKE 1.5 TABLETS (90 MG TOTAL) BY MOUTH DAILY. 135 tablet 3   KLOR-CON  M10 10 MEQ tablet TAKE 1 TABLET BY MOUTH EVERY DAY 90 tablet 1   MAGNESIUM  OXIDE PO Take 1 tablet by mouth at bedtime.     metFORMIN  (GLUCOPHAGE -XR) 500 MG 24 hr tablet TAKE 2 TABLET BY MOUTH EVERY DAY WITH BREAKFAST 180 tablet 3   Multiple Vitamins-Minerals (MULTIVITAMIN WOMEN 50+) TABS Take 1 tablet by mouth in the morning.     OXYGEN  Inhale 3 L into the lungs continuous.     predniSONE  (DELTASONE ) 10 MG tablet Take 6 tablets (60 mg) daily for 2 days, then, Take 5 tablets (50 mg) daily for 2 days, then, Take 4 tablets (40 mg) daily for 2 days, then, Take 3 tablets (30 mg) daily for 2 days, then Take 2 tablets (20mg ) daily for 2 days then start your home dose of once a day 40 tablet 0   predniSONE  (DELTASONE ) 10 MG tablet Take 1 tablet (10 mg total) by mouth daily with breakfast. After completing taper     torsemide (DEMADEX) 20 MG tablet Take 1 tablet (20 mg total) by mouth daily. 30 tablet 2   No current facility-administered medications on file prior to visit.        ROS:  All others reviewed and negative.  Objective        PE:  BP  124/70 (BP Location: Left Arm, Patient Position: Sitting, Cuff Size: Normal)   Pulse 77   Temp 98.4 F (36.9 C) (Oral)   Ht 5\' 1"  (1.549 m)   Wt 175 lb (79.4 kg)   SpO2 97%   BMI 33.07 kg/m                 Constitutional:  Pt appears in NAD               HENT: Head: NCAT.                Right Ear: External ear normal.                 Left Ear: External ear normal.                Eyes: . Pupils are equal, round, and reactive to light. Conjunctivae and EOM are normal               Nose: without d/c or deformity               Neck: Neck supple. Gross normal ROM               Cardiovascular: Normal rate and regular rhythm.                 Pulmonary/Chest: Effort normal and breath sounds decreased without rales or wheezing.                Abd:  Soft, NT, ND, + BS, no organomegaly               Neurological: Pt is alert. At baseline orientation, motor grossly intact               Skin: Skin is warm. No rashes, no other new lesions, LE edema - one               Psychiatric: Pt behavior is normal without agitation   Micro: none  Cardiac tracings I have personally interpreted today:  none  Pertinent Radiological findings (summarize): none   Lab Results  Component Value Date   WBC 11.5 (H) 03/22/2024   HGB 13.9 03/22/2024   HCT 44.9 03/22/2024   PLT 213 03/22/2024   GLUCOSE 186 (H) 03/23/2024   CHOL 158 03/11/2024   TRIG 224.0 (H) 03/11/2024   HDL 47.70 03/11/2024   LDLDIRECT 82.0 04/17/2023   LDLCALC 66 03/11/2024   ALT 25 03/11/2024   AST 16 03/11/2024   NA 140 03/23/2024   K 4.3 03/23/2024   CL 89 (L) 03/23/2024   CREATININE 0.62 03/23/2024   BUN 22 03/23/2024   CO2 42 (H) 03/23/2024   TSH 2.34 03/11/2024   INR 1.0 12/07/2023   HGBA1C 7.4 (H) 03/11/2024   MICROALBUR 6.2 (H) 03/11/2024   Assessment/Plan:  [name] is a 68 y.o. Other or two or more races [6] female with  has a past medical history of Bronchitis, CAD (coronary artery disease), CHF (congestive  heart failure) (HCC), COPD (chronic obstructive pulmonary disease) (HCC), Diabetes (HCC), Headache, Heart disease, Hyperlipidemia, Hypertension, Hypertensive emergency (07/01/2013), Impaired glucose tolerance (08/20/2014), and Sinusitis.  CHF (congestive heart failure) (HCC) Volume stable, cont current med tx  Chronic respiratory failure with hypoxia and hypercapnia (HCC) Stable cont home o2 3l continuous  COPD (chronic obstructive pulmonary disease) with chronic bronchitis (HCC) With exacerbation resolved, to finish prednisone , cont home PT, f/u pulm next wk as planned  Essential hypertension BP Readings from Last 3 Encounters:  03/26/24 124/70  03/23/24 (!) 126/45  03/17/24 (!) 158/68   Stable, pt to continue medical treatment norvasc  5 every day, zebeta  5 every day,   Followup: Return in about 6 weeks (around 05/07/2024).  Rosalia Colonel, MD 03/26/2024 9:03 PM [facility] - [facility] Internal Medicine

## 2024-03-26 NOTE — Assessment & Plan Note (Signed)
 BP Readings from Last 3 Encounters:  03/26/24 124/70  03/23/24 (!) 126/45  03/17/24 (!) 158/68   Stable, pt to continue medical treatment norvasc  5 every day, zebeta  5 every day,

## 2024-03-27 DIAGNOSIS — J398 Other specified diseases of upper respiratory tract: Secondary | ICD-10-CM | POA: Diagnosis not present

## 2024-03-27 DIAGNOSIS — A5901 Trichomonal vulvovaginitis: Secondary | ICD-10-CM | POA: Diagnosis not present

## 2024-03-27 DIAGNOSIS — D751 Secondary polycythemia: Secondary | ICD-10-CM | POA: Diagnosis not present

## 2024-03-27 DIAGNOSIS — E785 Hyperlipidemia, unspecified: Secondary | ICD-10-CM | POA: Diagnosis not present

## 2024-03-27 DIAGNOSIS — I051 Rheumatic mitral insufficiency: Secondary | ICD-10-CM | POA: Diagnosis not present

## 2024-03-27 DIAGNOSIS — G4733 Obstructive sleep apnea (adult) (pediatric): Secondary | ICD-10-CM | POA: Diagnosis not present

## 2024-03-27 DIAGNOSIS — I2510 Atherosclerotic heart disease of native coronary artery without angina pectoris: Secondary | ICD-10-CM | POA: Diagnosis not present

## 2024-03-27 DIAGNOSIS — J9621 Acute and chronic respiratory failure with hypoxia: Secondary | ICD-10-CM | POA: Diagnosis not present

## 2024-03-27 DIAGNOSIS — E559 Vitamin D deficiency, unspecified: Secondary | ICD-10-CM | POA: Diagnosis not present

## 2024-03-27 DIAGNOSIS — E1165 Type 2 diabetes mellitus with hyperglycemia: Secondary | ICD-10-CM | POA: Diagnosis not present

## 2024-03-27 DIAGNOSIS — K760 Fatty (change of) liver, not elsewhere classified: Secondary | ICD-10-CM | POA: Diagnosis not present

## 2024-03-27 DIAGNOSIS — G4734 Idiopathic sleep related nonobstructive alveolar hypoventilation: Secondary | ICD-10-CM | POA: Diagnosis not present

## 2024-03-27 DIAGNOSIS — L299 Pruritus, unspecified: Secondary | ICD-10-CM | POA: Diagnosis not present

## 2024-03-27 DIAGNOSIS — J9622 Acute and chronic respiratory failure with hypercapnia: Secondary | ICD-10-CM | POA: Diagnosis not present

## 2024-03-27 DIAGNOSIS — M5416 Radiculopathy, lumbar region: Secondary | ICD-10-CM | POA: Diagnosis not present

## 2024-03-27 DIAGNOSIS — I110 Hypertensive heart disease with heart failure: Secondary | ICD-10-CM | POA: Diagnosis not present

## 2024-03-27 DIAGNOSIS — G4700 Insomnia, unspecified: Secondary | ICD-10-CM | POA: Diagnosis not present

## 2024-03-27 DIAGNOSIS — I700 Atherosclerosis of aorta: Secondary | ICD-10-CM | POA: Diagnosis not present

## 2024-03-27 DIAGNOSIS — E872 Acidosis, unspecified: Secondary | ICD-10-CM | POA: Diagnosis not present

## 2024-03-27 DIAGNOSIS — J309 Allergic rhinitis, unspecified: Secondary | ICD-10-CM | POA: Diagnosis not present

## 2024-03-27 DIAGNOSIS — J441 Chronic obstructive pulmonary disease with (acute) exacerbation: Secondary | ICD-10-CM | POA: Diagnosis not present

## 2024-03-27 DIAGNOSIS — I5033 Acute on chronic diastolic (congestive) heart failure: Secondary | ICD-10-CM | POA: Diagnosis not present

## 2024-03-27 DIAGNOSIS — E873 Alkalosis: Secondary | ICD-10-CM | POA: Diagnosis not present

## 2024-03-27 DIAGNOSIS — G4719 Other hypersomnia: Secondary | ICD-10-CM | POA: Diagnosis not present

## 2024-03-27 DIAGNOSIS — J329 Chronic sinusitis, unspecified: Secondary | ICD-10-CM | POA: Diagnosis not present

## 2024-04-01 ENCOUNTER — Other Ambulatory Visit: Payer: Self-pay

## 2024-04-01 DIAGNOSIS — K760 Fatty (change of) liver, not elsewhere classified: Secondary | ICD-10-CM | POA: Diagnosis not present

## 2024-04-01 DIAGNOSIS — I5033 Acute on chronic diastolic (congestive) heart failure: Secondary | ICD-10-CM | POA: Diagnosis not present

## 2024-04-01 DIAGNOSIS — I2510 Atherosclerotic heart disease of native coronary artery without angina pectoris: Secondary | ICD-10-CM | POA: Diagnosis not present

## 2024-04-01 DIAGNOSIS — E1165 Type 2 diabetes mellitus with hyperglycemia: Secondary | ICD-10-CM | POA: Diagnosis not present

## 2024-04-01 DIAGNOSIS — E785 Hyperlipidemia, unspecified: Secondary | ICD-10-CM | POA: Diagnosis not present

## 2024-04-01 DIAGNOSIS — L299 Pruritus, unspecified: Secondary | ICD-10-CM | POA: Diagnosis not present

## 2024-04-01 DIAGNOSIS — E559 Vitamin D deficiency, unspecified: Secondary | ICD-10-CM | POA: Diagnosis not present

## 2024-04-01 DIAGNOSIS — G4719 Other hypersomnia: Secondary | ICD-10-CM | POA: Diagnosis not present

## 2024-04-01 DIAGNOSIS — E872 Acidosis, unspecified: Secondary | ICD-10-CM | POA: Diagnosis not present

## 2024-04-01 DIAGNOSIS — J398 Other specified diseases of upper respiratory tract: Secondary | ICD-10-CM | POA: Diagnosis not present

## 2024-04-01 DIAGNOSIS — M5416 Radiculopathy, lumbar region: Secondary | ICD-10-CM | POA: Diagnosis not present

## 2024-04-01 DIAGNOSIS — I110 Hypertensive heart disease with heart failure: Secondary | ICD-10-CM | POA: Diagnosis not present

## 2024-04-01 DIAGNOSIS — J309 Allergic rhinitis, unspecified: Secondary | ICD-10-CM | POA: Diagnosis not present

## 2024-04-01 DIAGNOSIS — D751 Secondary polycythemia: Secondary | ICD-10-CM | POA: Diagnosis not present

## 2024-04-01 DIAGNOSIS — I700 Atherosclerosis of aorta: Secondary | ICD-10-CM | POA: Diagnosis not present

## 2024-04-01 DIAGNOSIS — I051 Rheumatic mitral insufficiency: Secondary | ICD-10-CM | POA: Diagnosis not present

## 2024-04-01 DIAGNOSIS — J329 Chronic sinusitis, unspecified: Secondary | ICD-10-CM | POA: Diagnosis not present

## 2024-04-01 DIAGNOSIS — J441 Chronic obstructive pulmonary disease with (acute) exacerbation: Secondary | ICD-10-CM | POA: Diagnosis not present

## 2024-04-01 DIAGNOSIS — G4700 Insomnia, unspecified: Secondary | ICD-10-CM | POA: Diagnosis not present

## 2024-04-01 DIAGNOSIS — G4733 Obstructive sleep apnea (adult) (pediatric): Secondary | ICD-10-CM | POA: Diagnosis not present

## 2024-04-01 DIAGNOSIS — J9622 Acute and chronic respiratory failure with hypercapnia: Secondary | ICD-10-CM | POA: Diagnosis not present

## 2024-04-01 DIAGNOSIS — A5901 Trichomonal vulvovaginitis: Secondary | ICD-10-CM | POA: Diagnosis not present

## 2024-04-01 DIAGNOSIS — E873 Alkalosis: Secondary | ICD-10-CM | POA: Diagnosis not present

## 2024-04-01 DIAGNOSIS — G4734 Idiopathic sleep related nonobstructive alveolar hypoventilation: Secondary | ICD-10-CM | POA: Diagnosis not present

## 2024-04-01 DIAGNOSIS — J9621 Acute and chronic respiratory failure with hypoxia: Secondary | ICD-10-CM | POA: Diagnosis not present

## 2024-04-01 NOTE — Patient Instructions (Signed)
 Visit Information  Thank you for taking time to visit with me today. Please don't hesitate to contact me if I can be of assistance to you before our next scheduled telephone appointment.  Our next appointment is by telephone on 04/06/24 at 1000 am  Following is a copy of your care plan:   Goals Addressed             This Visit's Progress    VBCI Transitions of Care (TOC) Care Plan   On track    Problems: Reviewed with patient/or updated 04/01/24.  Recent Hospitalizations- 2 within 30 days of each other- both for treatment of COPD: Acute on Chronic Respiratory Failure in setting of ongoing smoking 03/24/24: patient reports she has not smoked since her last hospital admission on 03/18/24: positive reinforcement provided with encouragement to continue efforts- reinforced benefits of not smoking in setting of end-stage chronic respiratory failure Equipment/DME barrier rollator walker ordered at time of hospital discharge 03/17/24: patient has not yet received- verbalizes plans to discuss with PCP at time of hospital follow up office visit 03/26/24: currently reports not requiring use of assistive devices and Home Health services barrier: has not heard from home health team re: start of service since her most recent hospital discharge on  03/23/24: she confirms she has phone number and will call agency to schedule if she has not heard from them by Friday; confirmed she has contact information for agency Recent hospital admission May 26-28, 2025 for acute on chronic respiratory failure in setting of chronic home O2- patient declined hospice services at time of hospitalization/ hospital discharge Readmission less than 24-hours post 03/17/24 discharge:  May 29-March 23, 2024: Acute on Chronic Respiratory Failure/ end-stage chronic respiratory failure Fragile state of health, multiple progressing chronic health conditions- ongoing tobacco use- 03/24/24: as above, states has not smoked since 03/18/24- verbalizes plans  to continue not smoking: this was encouraged  3 unplanned hospital admissions x last 6/ 12 months- all for acute on chronic respiratory failur Reviewed with patient/or updated 04/01/24.  PCP was to order rollator/rolling walker, patient to check for order today. Added to recommendations.  Home Health via [facility] has been twice and patient reports enjoying the physical therapy.  Patient states a SW came to speak with her, she believes this was OP Palliative.  Overall patient states she is doing okay, breathing much better, and had been driving, shopping at stores where she can use shopping cart for support and oxygen  to walk slowly around with no reported issues. ([employer] provides oxygen ).  She is not smoking and never will again.    Goal:  Over the next 30 days, the patient will not experience hospital readmission  Interventions: Reviewed with patient/or updated 04/01/24.   Transitions of Care: Reviewed with patient/or updated 04/01/24 for TOC week #2 scheduled weekly call.   Reviewed with patient/or updated 04/01/24.  Assessment: Feeling much better, breathing much better--patient stated.  PCP was to order rollator/rolling walker, patient to check for order today. Added to recommendations.  Home Health via [facility] has been twice and patient reports enjoying the physical therapy.  Patient states a SW came to speak with her, she believes this was OP Palliative.  Overall patient states she is doing okay, breathing much better, and had been driving, shopping at stores where she can use shopping cart for support and oxygen  to walk slowly around with no reported issues. ([employer] provides oxygen ).  She is not smoking and never will again.  Patient continues to  monitor weight, blood pressure, blood sugars and keeps a record.  Continues with home oxygen  via [employer] at [address]. Reviewed follow up with PCP, and upcoming follow up with pulmonary on 04/05/24, she has transportation.   Transitions of Care:   03/24/24 week # 1/ day # 1: re-enrolled into TOC 30-day program Durable Medical Equipment (DME) needs assessed with patient/caregiver Doctor Visits  - discussed the importance of doctor visits Communication with PCP re: successful enrollment in [facility] 30-day program; patient report of needing order for rolling walker post-hospital discharge on 03/17/24- she still has not received; currently not requiring assistive devices: however, reports being weak; need to have it when I do need it; Contacted provider for patient needs DME as above: patient states she herself contacted [employer] today and they confirmed she needs new order placed- PCP made aware as above Post discharge activity limitations prescribed by provider reviewed Confirmed no new DME ordered this hospital discharge: however- she has still not received rolling walker from 03/17/24 hospital discharge: she reports she contacted [employer] [phone] and was told the prior order had expired: provided education around having PCP write new order at time of hospital follow up office visit on 03/26/24: she verbalizes understanding and agreement: will also message PCP re: same  Reports baseline shortness of breath- states breathing much better now post-hospital discharge; confirms using nebulizer, rescue/ maintenance inhalers, home O2 as instructed and as per baseline; confirms she has not smoked since hospital re-admission on 03/18/24; sounds to be in no respiratory distress throughout [facility] call today; provided education/ reinforcement around action plan for shortness of breath, benefits of not smoking; safe use of home O2- patient verbalizes good understanding of same with minimal prompting  Confirmed patient monitoring/ recording daily weights at home, as per baseline: reinforced rationale for daily weight monitoring at home along with weight gain guidelines/ action plan for weight gain; importance of taking diuretic as prescribed  Provided education  around need to balance efforts to prevent fluid overload along with efforts to ensure adequate fluid intake to prevent dehydration: encouraged patient to discuss how much daily fluid intake she should follow as a goal, with her providers at upcoming scheduled provider appointments  Reviewed upcoming provider office visits: 03/26/24- PCP and 04/05/24- pulmonary provider- both for hospital follow up office visits:  confirmed patient is aware of all and has plans to attend as scheduled Purpose of/ and importance of having updated DPR completed provided education around process of updating DPR encouraged her to update at time of upcoming PCP appointment on 03/26/24: patient reports current DPR is listed wrong; states her sister's name is Milicent, not Media Spikes; encouraged her to update this form accoridngly Confirms continuing to use home O2 as per hospital discharge instructions: 2- 4 L/min; reviewed recent home SaO2 readings at home post-hospital discharge: reports between 88%- 90% with ongoing frequent checks at home Provided education/ reinforcement around basic safe use of home O2- need to refrain from smoking around home O2 Confirmed/ reinforced action plan for shortness of breath: nebulizer solution, home O2, pursed lip breathing, conservation of energy, rescue and maintenance inhaler- sister/ caregiver verbalizes good understanding/ adherence to same Provided education around signs of hypoxia/ hypercarbia along with action plan for same  Reinforced need for patient to stay hydrated along with signs/ symptoms dehydration and corresponding action plan Discussed options for care if patient/ caregiver continues to worsen clinically/ feels medical evaluation needed prior to scheduled PCP provider office visit currently scheduled on 03/26/24 for hospital follow up  Again discussed need for goals of care discussion as per hospital discharge notes: provided education around difference between palliative care  and hospice services/ role of both: encouraged patient and caregiver to fully engage with palliative care team ([facility]) as per hospital discharge referral on 03/23/24, once outreach is established Successfully enrolled into 30-day TOC program; provided my direct contact information should questions/ concerns/ needs arise post-TOC initial call, prior to next [facility] 30-day program RN CM telephone visit     COPD Interventions:Feeling much better, breathing much better--patient stated. Reviewed with patient/or updated 04/01/24.  Advised patient to track and manage COPD triggers Assessed social determinant of health barriers Discussed the importance of adequate rest and management of fatigue with COPD Provided instruction about proper use of medications used for management of COPD including inhalers Use of home oxygen   Reviewed with patient/or updated 04/01/24.  Patient Self Care Activities:Attend all scheduled provider appointments Call provider office for new concerns or questions  Participate in Transition of Care Program/Attend TOC scheduled calls Take medications as prescribed   Great job not smoking since you have come home from the hospital-- Please continue NOT SMOKING- this is the most important thing you can do to improve your health and your breathing Continue pacing activity to avoid episodes of shortness of breath Continue using home oxygen  as prescribed- continue monitoring your oxygen  levels at home frequently Continue to follow your established action plan for episodes of shortness of breath- seek emergency care if you feel your condition is worsening and you are struggling to breathe Continue working with the home health team that is involved in your care once they make their first home visit If you believe your condition is getting worse- contact your care providers (doctors) promptly- reaching out to your doctor early when you have concerns can prevent you from having to go to the  hospital Please update the release form at your PCP office to reflect your sister's proper name as we discussed today Please ask Dr. Autry Legions to order you a new rolling walker when you visit him on 03/26/24 Please talk to your doctors regularly about palliative and/ or hospice care options  Plan:  Telephone follow up appointment with care management team member scheduled for:    Tuesday 04/06/24 at 10:00 am  Plan for next week's call: Reviewed with patient/or updated 04/01/24.  Review provider office visit/ labs/ medication changes from 03/26/24: hospital follow up scheduled PCP: (No changes reported from Pcp OV on 6/6) Review medication adherence- ? Finished prednisone  taper and started maintenance (baseline) dose? (Continuing taper dose on 04/01/24.)  Confirmed [facility] home health active/ Review home health PT/ RN visits (Yes, 2x so far).  Confirm patient has heard from Palliative Care Team- ? First visit scheduled? (SW visited patient from palliative) Discuss smoking status with patient- ? Still not smoking since 03/18/24 ? (Not smoking, never will again.  Reinforce action plan for COPD: monitoring SaO2 and daily weights at home: review both/ use of nebulizers/ home O2/ rescue inhalers ? (Ongoing 04/01/24).  ? Resumed monitoring blood sugars at home/ use of sliding scale insulin  ? (Confirmed 04/01/24 114 this am).           Patient verbalizes understanding of instructions and care plan provided today and agrees to view in MyChart. Active MyChart status and patient understanding of how to access instructions and care plan via MyChart confirmed with patient.     Telephone follow up appointment with care management team member scheduled for: Follow up with  provider re: rollator order  Please call the care guide team at [phone] if you need to cancel or reschedule your appointment.   Please call the Suicide and Crisis Lifeline: 988 call the [place]  National Suicide Prevention Lifeline:  531-812-9592 or TTY: (325) 672-4699 TTY (407)808-2826) to talk to a trained counselor call 1-800-273-TALK (toll free, 24 hour hotline) if you are experiencing a Mental Health or Behavioral Health Crisis or need someone to talk to.   Katheryn Pandy MSN, RN RN Case Sales executive Health  VBCI-Population Health Office Hours M-F [phone] Direct Dial: [phone] Main Phone [phone]  Fax: [phone] [email or web address]

## 2024-04-01 NOTE — Transitions of Care (Post Inpatient/ED Visit) (Signed)
 Transition of Care week 2  Visit Note  04/01/2024  Name: [name] MRN: [id number]          DOB: September 26, 1956  Situation: Patient enrolled in [facility] 30-day program. Visit completed with patient by telephone.   Background:   Initial Transition Care Management Follow-up Telephone Call    Past Medical History:  Diagnosis Date   Bronchitis    CAD (coronary artery disease)    a. 02/2002: CABG x3 with LIMA to LAD, RIMA to RCA, and SVG to OM   CHF (congestive heart failure) (HCC)    COPD (chronic obstructive pulmonary disease) (HCC)    Diabetes (HCC)    Headache    Heart disease    Hyperlipidemia    Hypertension    Hypertensive emergency 07/01/2013   Impaired glucose tolerance 08/20/2014   Sinusitis     Assessment: Patient Reported Symptoms: Cognitive Cognitive Status: Able to follow simple commands, Alert and oriented to person, place, and time, Normal speech and language skills      Neurological Neurological Review of Symptoms: No symptoms reported    HEENT    No symtpoms reported.     Cardiovascular Cardiovascular Symptoms Reported: No symptoms reported Does patient have uncontrolled Hypertension?: No Cardiovascular Conditions: Hypertension, Heart failure Cardiovascular Management Strategies: Medication therapy, Diet modification, Weight management, Routine screening, Activity, Adequate rest Do You Have a Working Readable Scale?: Yes Weight: 177 lb (80.3 kg) (pateint reported from this am weight.) Cardiovascular Self-Management Outcome: 4 (good) Cardiovascular Comment: Checks BP, weights, blood sugars daily and keeps a record. Trying to stick to diet modifications to control blood sugar/weight  Respiratory Respiratory Symptoms Reported: Other:, Shortness of breath Other Respiratory Symptoms: Patient states she is breathing much better, much better now. Using home oxygen  with transport tank, oxygen  agenc is [employer]. States she walked around [employer] yesterday  using shopping cart for some support and to hold oxygen  tank and to get out of  the house, no issues with this activity she stated. Respiratory Conditions: COPD, Shortness of breath Respiratory Self-Management Outcome: 4 (good)  Endocrine Patient reports the following symptoms related to hypoglycemia or hyperglycemia : No symptoms reported Is patient diabetic?: Yes Is patient checking blood sugars at home?: Yes Endocrine Conditions: Diabetes Endocrine Management Strategies: Medication therapy, Routine screening, Medical device, Weight management, Diet modification, Activity Endocrine Self-Management Outcome: 4 (good)  Gastrointestinal Gastrointestinal Symptoms Reported: No symptoms reported Other Gastrointestinal Symptoms: trying to modify diet for low Na and low sugar, not eating much but on purpose. Additional Gastrointestinal Details: Had a small BM 03/31/24.   Nutrition Risk Screen (CP): No indicators present  Genitourinary Genitourinary Symptoms Reported: Other, No symptoms reported Other Genitourinary Symptoms: On diuretics.    Integumentary Integumentary Symptoms Reported: No symptoms reported    Musculoskeletal Additional Musculoskeletal Details: Requested rolling walker/rollator at 03/26/24 PCP HFU, stated provider was to put in an order for one. Patient will follow up, as RN CM did not see an order in PCP OV notes. Other Musculoskeletal Conditions: Feeling much better and stronger, enjoying working with Home Health PT via [facility]. Musculoskeletal Management Strategies: Coping strategies, Routine screening, Activity (Home Health therapy via [facility] home health.) Musculoskeletal Self-Management Outcome: 4 (good) Falls in the past year?: No Patient at Risk for Falls Due to: No Fall Risks (No falls reported this call, 03/31/24.)  Psychosocial Psychosocial Symptoms Reported: No symptoms reported Additional Psychological Details: Denies anxiety or depression symptoms, has normal worries  about health issues.     Quality  of Family Relationships: supportive Do you feel physically threatened by others?: No   Vitals:   04/01/24 1013  BP: 115/60    Medications Reviewed Today     Reviewed by Areta Beer, RN (Case Manager) on 04/01/24 at 1038  Med List Status: <None>   Medication Order Taking? Sig Documenting Provider Last Dose Status Informant  acetaminophen  (TYLENOL ) 500 MG tablet [id number] Yes Take 1,000 mg by mouth 2 (two) times daily as needed for headache, fever, moderate pain or mild pain. [provider]  Active Pharmacy Records, Self  albuterol  (VENTOLIN  HFA) 108 (90 Base) MCG/ACT inhaler [id number] Yes INHALE 1-2 PUFFS BY MOUTH EVERY 6 HOURS AS NEEDED FOR WHEEZE OR SHORTNESS OF Standley Earing, MD  Active Pharmacy Records, Self  alendronate  (FOSAMAX ) 70 MG tablet [id number] Yes Take 70 mg by mouth once a week. [provider]  Active Pharmacy Records, Self  amLODipine  (NORVASC ) 5 MG tablet [id number] Yes TAKE 1 TABLET (5 MG TOTAL) BY MOUTH DAILY. Hugh Madura, MD  Active Pharmacy Records, Self  atorvastatin  (LIPITOR) 40 MG tablet [id number] Yes Take 1 tablet (40 mg total) by mouth daily. Roslyn Coombe, MD  Active Pharmacy Records, Self  bisoprolol  (ZEBETA ) 5 MG tablet [id number] Yes TAKE 1 TABLET (5 MG TOTAL) BY MOUTH DAILY. Hugh Madura, MD  Active Pharmacy Records, Self  Cholecalciferol  (VITAMIN D -3 PO) [id number] Yes Take 1 capsule by mouth in the morning. [provider]  Active Pharmacy Records, Self  Cyanocobalamin  (VITAMIN B-12 PO) [id number] Yes Take 1 tablet by mouth in the morning. [provider]  Active Pharmacy Records, Self  Dulaglutide  (TRULICITY ) 3 MG/0.5ML Stevens Eland [id number] Yes Inject 3 mg as directed once a week.  Patient taking differently: Inject 1.5 mg as directed once a week.   Roslyn Coombe, MD  Active Pharmacy Records, Self  Dupilumab  (DUPIXENT ) 300 MG/2ML Stevens Eland [id number] Yes Inject 300 mg into the skin  every 14 (fourteen) days. Lind Repine, MD  Active Pharmacy Records, Self           Med Note (TOUSEY, LAINE M   Wed Mar 24, 2024 11:18 AM) 03/24/24:  Reports during TOC call, pulmonary provider instructed her to hold this medication indefinitely- she is not currently taking  fluticasone  (FLONASE ) 50 MCG/ACT nasal spray [id number] Yes Place 1 spray into both nostrils daily. Iver Marker  Active Pharmacy Records, Self  Fluticasone -Umeclidin-Vilant (TRELEGY ELLIPTA ) 200-62.5-25 MCG/ACT AEPB [id number] Yes INHALE 1 PUFF BY MOUTH EVERY DAY Alva, Rakesh V, MD  Active Pharmacy Records, Self  guaiFENesin -dextromethorphan  (ROBITUSSIN DM) 100-10 MG/5ML syrup [id number] Yes Take 5 mLs by mouth every 4 (four) hours as needed for cough. Pokhrel, Laxman, MD  Active   insulin  aspart (NOVOLOG  FLEXPEN) 100 UNIT/ML FlexPen [id number] Yes Three times daily for meals. CBG 70 - 120: 0 units CBG 121 - 150: 3 units CBG 151 - 200: 4 units CBG 201 - 250: 7 units CBG 251 - 300: 11 units CBG 301 - 350: 15 units CBG 351 - 400: 20 units Pokhrel, Laxman, MD  Active   Insulin  Pen Needle 31G X 8 MM MISC [id number] Yes Use as directed with insulin  pens Pokhrel, Laxman, MD  Active   ipratropium-albuterol  (DUONEB) 0.5-2.5 (3) MG/3ML SOLN [id number] Yes INHALE 3 ML BY NEBULIZER EVERY 6 HOURS AS NEEDED Lind Repine, MD  Active Pharmacy Records, Self  isosorbide  mononitrate (IMDUR ) 60 MG 24 hr tablet [id number] Yes TAKE 1.5 TABLETS (90 MG TOTAL) BY  MOUTH DAILY. Hugh Madura, MD  Active Pharmacy Records, Self  KLOR-CON  M10 10 MEQ tablet [id number] Yes TAKE 1 TABLET BY MOUTH EVERY DAY Roslyn Coombe, MD  Active Pharmacy Records, Self  MAGNESIUM  OXIDE PO [id number] Yes Take 1 tablet by mouth at bedtime. [provider]  Active Pharmacy Records, Self  metFORMIN  (GLUCOPHAGE -XR) 500 MG 24 hr tablet [id number] Yes TAKE 2 TABLET BY MOUTH EVERY DAY WITH BREAKFAST John, James W, MD  Active Pharmacy Records, Self  Multiple Vitamins-Minerals  (MULTIVITAMIN WOMEN 50+) TABS [id number] Yes Take 1 tablet by mouth in the morning. [provider]  Active Pharmacy Records, Self  OXYGEN  [id number] Yes Inhale 3 L into the lungs continuous. [provider]  Active Self  predniSONE  (DELTASONE ) 10 MG tablet [id number] Yes Take 6 tablets (60 mg) daily for 2 days, then, Take 5 tablets (50 mg) daily for 2 days, then, Take 4 tablets (40 mg) daily for 2 days, then, Take 3 tablets (30 mg) daily for 2 days, then Take 2 tablets (20mg ) daily for 2 days then start your home dose of once a day Pokhrel, Laxman, MD  Active   predniSONE  (DELTASONE ) 10 MG tablet [id number] Yes Take 1 tablet (10 mg total) by mouth daily with breakfast. After completing taper  Patient taking differently: Take 10 mg by mouth daily with breakfast. After completing taper doses   Pokhrel, Laxman, MD  Active   torsemide  (DEMADEX ) 20 MG tablet [id number] Yes Take 1 tablet (20 mg total) by mouth daily. Rosena Conradi, MD  Active             Recommendation:   DME requests:  walker Rollator  Follow Up Plan:   Telephone follow-up in 1 week Tuesday 04/06/24 at 10:00 am with nurse Festus Hubert MSN, RN RN Case Manager [facility]  [facility]Population [facility] Office Hours M-F [phone] Direct Dial: [phone] Main Phone [phone]  Fax: [phone] [email or web address]        04/01/2024  Name: [name] MRN: [id number] DOB: 1956-09-22  Today's TOC FU Call Status:   Patient's Name and Date of Birth confirmed.  Transition Care Management Follow-up Telephone Call    Items Reviewed:    Medications Reviewed Today: Medications Reviewed Today     Reviewed by Areta Beer, RN (Case Manager) on 04/01/24 at 1038  Med List Status: <None>   Medication Order Taking? Sig Documenting Provider Last Dose Status Informant  acetaminophen  (TYLENOL ) 500 MG tablet [id number] Yes Take 1,000 mg by mouth 2 (two) times daily as needed for headache, fever,  moderate pain or mild pain. [provider]  Active Pharmacy Records, Self  albuterol  (VENTOLIN  HFA) 108 (90 Base) MCG/ACT inhaler [id number] Yes INHALE 1-2 PUFFS BY MOUTH EVERY 6 HOURS AS NEEDED FOR WHEEZE OR SHORTNESS OF Standley Earing, MD  Active Pharmacy Records, Self  alendronate  (FOSAMAX ) 70 MG tablet [id number] Yes Take 70 mg by mouth once a week. [provider]  Active Pharmacy Records, Self  amLODipine  (NORVASC ) 5 MG tablet [id number] Yes TAKE 1 TABLET (5 MG TOTAL) BY MOUTH DAILY. Hugh Madura, MD  Active Pharmacy Records, Self  atorvastatin  (LIPITOR) 40 MG tablet [id number] Yes Take 1 tablet (40 mg total) by mouth daily. Roslyn Coombe, MD  Active Pharmacy Records, Self  bisoprolol  (ZEBETA ) 5 MG tablet [id number] Yes TAKE 1 TABLET (5 MG TOTAL) BY MOUTH DAILY. Hugh Madura, MD  Active Pharmacy Records, Self  Cholecalciferol  (VITAMIN D -3 PO) [id number]  Yes Take 1 capsule by mouth in the morning. [provider]  Active Pharmacy Records, Self  Cyanocobalamin  (VITAMIN B-12 PO) [id number] Yes Take 1 tablet by mouth in the morning. [provider]  Active Pharmacy Records, Self  Dulaglutide  (TRULICITY ) 3 MG/0.5ML Stevens Eland [id number] Yes Inject 3 mg as directed once a week.  Patient taking differently: Inject 1.5 mg as directed once a week.   Roslyn Coombe, MD  Active Pharmacy Records, Self  Dupilumab  (DUPIXENT ) 300 MG/2ML Stevens Eland [id number] Yes Inject 300 mg into the skin every 14 (fourteen) days. Lind Repine, MD  Active Pharmacy Records, Self           Med Note (TOUSEY, LAINE M   Wed Mar 24, 2024 11:18 AM) 03/24/24:  Reports during TOC call, pulmonary provider instructed her to hold this medication indefinitely- she is not currently taking  fluticasone  (FLONASE ) 50 MCG/ACT nasal spray [id number] Yes Place 1 spray into both nostrils daily. Vernestine Gondola, PA-C  Active Pharmacy Records, Self  Fluticasone -Umeclidin-Vilant (TRELEGY ELLIPTA ) 200-62.5-25 MCG/ACT AEPB  [id number] Yes INHALE 1 PUFF BY MOUTH EVERY DAY Lind Repine, MD  Active Pharmacy Records, Self  guaiFENesin -dextromethorphan  (ROBITUSSIN DM) 100-10 MG/5ML syrup [id number] Yes Take 5 mLs by mouth every 4 (four) hours as needed for cough. Pokhrel, Laxman, MD  Active   insulin  aspart (NOVOLOG  FLEXPEN) 100 UNIT/ML FlexPen [id number] Yes Three times daily for meals. CBG 70 - 120: 0 units CBG 121 - 150: 3 units CBG 151 - 200: 4 units CBG 201 - 250: 7 units CBG 251 - 300: 11 units CBG 301 - 350: 15 units CBG 351 - 400: 20 units Pokhrel, Laxman, MD  Active   Insulin  Pen Needle 31G X 8 MM MISC [id number] Yes Use as directed with insulin  pens Pokhrel, Laxman, MD  Active   ipratropium-albuterol  (DUONEB) 0.5-2.5 (3) MG/3ML SOLN [id number] Yes INHALE 3 ML BY NEBULIZER EVERY 6 HOURS AS NEEDED Lind Repine, MD  Active Pharmacy Records, Self  isosorbide  mononitrate (IMDUR ) 60 MG 24 hr tablet [id number] Yes TAKE 1.5 TABLETS (90 MG TOTAL) BY MOUTH DAILY. Hugh Madura, MD  Active Pharmacy Records, Self  KLOR-CON  M10 10 MEQ tablet [id number] Yes TAKE 1 TABLET BY MOUTH EVERY DAY Roslyn Coombe, MD  Active Pharmacy Records, Self  MAGNESIUM  OXIDE PO [id number] Yes Take 1 tablet by mouth at bedtime. [provider]  Active Pharmacy Records, Self  metFORMIN  (GLUCOPHAGE -XR) 500 MG 24 hr tablet [id number] Yes TAKE 2 TABLET BY MOUTH EVERY DAY WITH BREAKFAST John, James W, MD  Active Pharmacy Records, Self  Multiple Vitamins-Minerals (MULTIVITAMIN WOMEN 50+) TABS [id number] Yes Take 1 tablet by mouth in the morning. [provider]  Active Pharmacy Records, Self  OXYGEN  [id number] Yes Inhale 3 L into the lungs continuous. [provider]  Active Self  predniSONE  (DELTASONE ) 10 MG tablet [id number] Yes Take 6 tablets (60 mg) daily for 2 days, then, Take 5 tablets (50 mg) daily for 2 days, then, Take 4 tablets (40 mg) daily for 2 days, then, Take 3 tablets (30 mg) daily for 2 days, then Take 2 tablets (20mg ) daily  for 2 days then start your home dose of once a day Pokhrel, Laxman, MD  Active   predniSONE  (DELTASONE ) 10 MG tablet [id number] Yes Take 1 tablet (10 mg total) by mouth daily with breakfast. After completing taper  Patient taking differently: Take 10 mg by mouth daily with breakfast. After completing taper doses   Pokhrel, Laxman,  MD  Active   torsemide  (DEMADEX ) 20 MG tablet [id number] Yes Take 1 tablet (20 mg total) by mouth daily. Pokhrel, Amador Bad, MD  Active                Katheryn Pandy MSN, RN RN Case Manager [facility]  [facility]BC[facility]-Population Health Office Hours M-F [phone] Direct Dial: [phone] Main Phone [phone]  Fax: [phone] [email or web address]

## 2024-04-03 ENCOUNTER — Other Ambulatory Visit: Payer: Self-pay | Admitting: Internal Medicine

## 2024-04-04 ENCOUNTER — Other Ambulatory Visit: Payer: Self-pay | Admitting: Cardiology

## 2024-04-05 ENCOUNTER — Encounter (HOSPITAL_BASED_OUTPATIENT_CLINIC_OR_DEPARTMENT_OTHER): Payer: Self-pay | Admitting: Pulmonary Disease

## 2024-04-05 ENCOUNTER — Ambulatory Visit (HOSPITAL_BASED_OUTPATIENT_CLINIC_OR_DEPARTMENT_OTHER): Admitting: Pulmonary Disease

## 2024-04-05 VITALS — BP 105/49 | HR 85 | Ht 61.0 in | Wt 179.0 lb

## 2024-04-05 DIAGNOSIS — J9612 Chronic respiratory failure with hypercapnia: Secondary | ICD-10-CM | POA: Diagnosis not present

## 2024-04-05 DIAGNOSIS — J441 Chronic obstructive pulmonary disease with (acute) exacerbation: Secondary | ICD-10-CM | POA: Diagnosis not present

## 2024-04-05 DIAGNOSIS — J9611 Chronic respiratory failure with hypoxia: Secondary | ICD-10-CM | POA: Diagnosis not present

## 2024-04-05 NOTE — Patient Instructions (Signed)
 VISIT SUMMARY:  You had a follow-up appointment today after your recent hospitalization for a COPD exacerbation. Your lung function has improved, and you are continuing with your current treatments. We discussed your COPD, chronic respiratory failure, and type 2 diabetes management, as well as your goals of care.  YOUR PLAN:  -CHRONIC OBSTRUCTIVE PULMONARY DISEASE (COPD) WITH EXACERBATION: COPD is a chronic lung disease that makes it hard to breathe. You recently had a flare-up that required hospitalization. Your symptoms have improved with prednisone , and you are using a nebulizer 2-3 times daily and a CPAP machine at night. Continue these treatments and avoid exposure to infections. Stay active as much as you can.  -CHRONIC RESPIRATORY FAILURE WITH HYPERCAPNIA: This condition means your lungs are not getting enough oxygen  into your blood and not removing enough carbon dioxide. You are taking torosemide to manage fluid levels, and there has been no recent fluid buildup.  -TYPE 2 DIABETES MELLITUS: Type 2 diabetes is a condition where your body does not use insulin  properly, leading to high blood sugar levels. Your blood sugar levels have been elevated due to prednisone  but are improving. Continue taking metformin  and monitor your blood sugar levels. Use sliding scale insulin  if your blood sugar exceeds 200 mg/dL, but be careful to avoid low blood sugar.  -GOALS OF CARE: Given the advanced stage of your COPD, we discussed focusing on maintaining your quality of life rather than aggressive treatments. You and your family agree to avoid life support measures and focus on comfort and quality of life.

## 2024-04-05 NOTE — Progress Notes (Signed)
 Subjective:    Patient ID: [name], female    DOB: [birth year], 68 y.o.   MRN: [id number]  HPI  67 yo smoker for FU of COPD, quit 11/2017 and mild tracheomalacia with chronic hypoxic & hypercarbic resp failure on o2 since 2018 -NIV since 2024   Recurrent exacerbations in 2022, requiring maintenance prednisone  starting 06/2021 -hospitalized 10/2022  for 2 days for COPD exacerbation treated with BiPAP, steroids and bronchodilators    PMH - CABG HFpEF   11/19/23 started dupixent  300 mg SQ q 2 weeks Hosp 11/2023 acute on chronic hypoxic/hypercapnic respiratory failure due to COPD exacerbation due to coronavirus infection. hosp 03/15/2024 for 2 days , Readm 5/29 for shortness of breath, orthopnea and hypoxia to 80s on 3 L nasal cannula >> DNR/I issued  She was recently hospitalized for a COPD exacerbation and discharged after treatment with prednisone . Her lung function has improved, with incentive spirometry reading of 1250. She continues to use a nebulizer two to three times daily and sleeps with a mask for at least eight hours each night. She experiences a slight cough with phlegm and chest tightness in the late afternoon. She feels tired in the afternoon but has been more active, including walking and other activities.  Her current medications include prednisone  10 mg daily, furosemide , Trelegy, and DuoNabs for her nebulizer. Dupixent  was discontinued. She has lost weight since her hospitalization and avoids salt to prevent fluid retention. She is interested in returning to the gym and increasing her physical activity.  Significant tests/ events reviewed   CT angiogram chest 01/2020-emphysema, mild tracheomalacia   04/2019-pulmonary function test- FVC 1.05 (44% predicted), postbronchodilator ratio 47, postbronchodilator FEV1 0.47 (25% predicted), DLCO 54   Spirometry 02/2017 >> ratio of 59, FEV1 of 42% FVC of 56% ABG 01/2014 was 7.29/66/75/92%   08/2014 >> PSG neg for OSA ,  Desaturation as low as 85%   05/2023 AEC 319  Review of Systems neg for any significant sore throat, dysphagia, itching, sneezing, nasal congestion or excess/ purulent secretions, fever, chills, sweats, unintended wt loss, pleuritic or exertional cp, hempoptysis, orthopnea pnd or change in chronic leg swelling. Also denies presyncope, palpitations, heartburn, abdominal pain, nausea, vomiting, diarrhea or change in bowel or urinary habits, dysuria,hematuria, rash, arthralgias, visual complaints, headache, numbness weakness or ataxia.     Objective:   Physical Exam  Gen. Pleasant, obese, in no distress ENT - no lesions, no post nasal drip Neck: No JVD, no thyromegaly, no carotid bruits Lungs: no use of accessory muscles, no dullness to percussion, decreased without rales or rhonchi  Cardiovascular: Rhythm regular, heart sounds  normal, no murmurs or gallops, no peripheral edema Musculoskeletal: No deformities, no cyanosis or clubbing , no tremors       Assessment & Plan:   Chronic obstructive pulmonary disease (COPD) with exacerbation Recent exacerbation requiring hospitalization. Symptoms improved with prednisone  taper; currently on 10 mg daily. Uses nebulizer 2-3 times daily and CPAP at night. Reports improved lung function and reduced wheezing, but experiences afternoon chest tightness. Dupixent  discontinued due to lack of efficacy. In advanced stage of COPD on maximum treatment. Discussed unpredictability of disease progression and importance of infection avoidance. Prognosis uncertain, influenced by infection prevention and smoking cessation. - Continue prednisone  10 mg daily - Continue nebulizer treatments 2-3 times daily - Continue CPAP use at night - Adjust prednisone  if breathing worsens - Avoid exposure to infections - Encourage physical activity as tolerated  Chronic respiratory failure with hypercapnia No recent fluid  buildup.  - Continue torosemide as prescribed -continue  NIV  Type 2 diabetes mellitus Managed with metformin . Blood sugar levels elevated due to prednisone  but improved with dose reduction. Recent readings: 122 mg/dL fasting, 161 mg/dL postprandial. Sliding scale insulin  caused hypoglycemia; plan to adjust dosing if blood sugar exceeds 200 mg/dL. - Continue metformin  as prescribed - Monitor blood sugar levels - Use sliding scale insulin  if blood sugar exceeds 200 mg/dL, adjusting dose to avoid hypoglycemia  Goals of Care Discussed advanced stage of COPD and unpredictability of progression. Emphasized maintaining quality of life and avoiding life support. Patient and family understand prognosis and agree to focus on quality of life over aggressive interventions. - Focus on maintaining quality of life - DNR/I confirmed

## 2024-04-06 ENCOUNTER — Other Ambulatory Visit: Payer: Self-pay | Admitting: *Deleted

## 2024-04-06 DIAGNOSIS — E785 Hyperlipidemia, unspecified: Secondary | ICD-10-CM | POA: Diagnosis not present

## 2024-04-06 DIAGNOSIS — J441 Chronic obstructive pulmonary disease with (acute) exacerbation: Secondary | ICD-10-CM | POA: Diagnosis not present

## 2024-04-06 DIAGNOSIS — E1165 Type 2 diabetes mellitus with hyperglycemia: Secondary | ICD-10-CM | POA: Diagnosis not present

## 2024-04-06 DIAGNOSIS — A5901 Trichomonal vulvovaginitis: Secondary | ICD-10-CM | POA: Diagnosis not present

## 2024-04-06 DIAGNOSIS — M5416 Radiculopathy, lumbar region: Secondary | ICD-10-CM | POA: Diagnosis not present

## 2024-04-06 DIAGNOSIS — E872 Acidosis, unspecified: Secondary | ICD-10-CM | POA: Diagnosis not present

## 2024-04-06 DIAGNOSIS — J398 Other specified diseases of upper respiratory tract: Secondary | ICD-10-CM | POA: Diagnosis not present

## 2024-04-06 DIAGNOSIS — J309 Allergic rhinitis, unspecified: Secondary | ICD-10-CM | POA: Diagnosis not present

## 2024-04-06 DIAGNOSIS — E873 Alkalosis: Secondary | ICD-10-CM | POA: Diagnosis not present

## 2024-04-06 DIAGNOSIS — J329 Chronic sinusitis, unspecified: Secondary | ICD-10-CM | POA: Diagnosis not present

## 2024-04-06 DIAGNOSIS — I110 Hypertensive heart disease with heart failure: Secondary | ICD-10-CM | POA: Diagnosis not present

## 2024-04-06 DIAGNOSIS — I5033 Acute on chronic diastolic (congestive) heart failure: Secondary | ICD-10-CM | POA: Diagnosis not present

## 2024-04-06 DIAGNOSIS — G4719 Other hypersomnia: Secondary | ICD-10-CM | POA: Diagnosis not present

## 2024-04-06 DIAGNOSIS — I700 Atherosclerosis of aorta: Secondary | ICD-10-CM | POA: Diagnosis not present

## 2024-04-06 DIAGNOSIS — E559 Vitamin D deficiency, unspecified: Secondary | ICD-10-CM | POA: Diagnosis not present

## 2024-04-06 DIAGNOSIS — J9621 Acute and chronic respiratory failure with hypoxia: Secondary | ICD-10-CM | POA: Diagnosis not present

## 2024-04-06 DIAGNOSIS — J9622 Acute and chronic respiratory failure with hypercapnia: Secondary | ICD-10-CM | POA: Diagnosis not present

## 2024-04-06 DIAGNOSIS — K760 Fatty (change of) liver, not elsewhere classified: Secondary | ICD-10-CM | POA: Diagnosis not present

## 2024-04-06 DIAGNOSIS — I051 Rheumatic mitral insufficiency: Secondary | ICD-10-CM | POA: Diagnosis not present

## 2024-04-06 DIAGNOSIS — L299 Pruritus, unspecified: Secondary | ICD-10-CM | POA: Diagnosis not present

## 2024-04-06 DIAGNOSIS — I2510 Atherosclerotic heart disease of native coronary artery without angina pectoris: Secondary | ICD-10-CM | POA: Diagnosis not present

## 2024-04-06 DIAGNOSIS — G4733 Obstructive sleep apnea (adult) (pediatric): Secondary | ICD-10-CM | POA: Diagnosis not present

## 2024-04-06 DIAGNOSIS — G4700 Insomnia, unspecified: Secondary | ICD-10-CM | POA: Diagnosis not present

## 2024-04-06 DIAGNOSIS — G4734 Idiopathic sleep related nonobstructive alveolar hypoventilation: Secondary | ICD-10-CM | POA: Diagnosis not present

## 2024-04-06 DIAGNOSIS — D751 Secondary polycythemia: Secondary | ICD-10-CM | POA: Diagnosis not present

## 2024-04-06 NOTE — Transitions of Care (Post Inpatient/ED Visit) (Signed)
 Transition of Care week 3/ day # 14  Visit Note  04/06/2024  Name: [name] MRN: [id number]          DOB: 05/03/1956  Situation: Patient enrolled in [facility] 30-day program. Visit completed with patient by telephone.   HIPAA identifiers x 2 verified  Background:  Recent Hospitalizations- 2 within 30 days of each other- both for treatment of COPD: Acute on Chronic Respiratory Failure in setting of ongoing smoking Recent hospital admission May 26-28, 2025 for acute on chronic respiratory failure in setting of chronic home O2- patient declined hospice services at time of hospitalization/ hospital discharge Readmission less than 24-hours post 03/17/24 discharge:  May 29-March 23, 2024: Acute on Chronic Respiratory Failure/ end-stage chronic respiratory failure Fragile state of health, multiple progressing chronic health conditions- ongoing tobacco use- 03/24/24: has not smoked since 03/18/24 3 unplanned hospital admissions x last 6/ 12 months- all for acute on chronic respiratory failure  Initial Transition Care Management Follow-up Telephone Call    Past Medical History:  Diagnosis Date   Bronchitis    CAD (coronary artery disease)    a. 02/2002: CABG x3 with LIMA to LAD, RIMA to RCA, and SVG to OM   CHF (congestive heart failure) (HCC)    COPD (chronic obstructive pulmonary disease) (HCC)    Diabetes (HCC)    Headache    Heart disease    Hyperlipidemia    Hypertension    Hypertensive emergency 07/01/2013   Impaired glucose tolerance 08/20/2014   Sinusitis    Assessment:  I am breathing better than I have in a long time; even able to go to the gym for light workouts.  Still not smoking; my weights and blood pressures and blood sugars are all under control.  Home Health still coming, they will be here today.  No changes to my medicines after seeing my PCP and my pulmonary doctors.  I am doing great.  Not having to use any cane right now, but I have called [employer] and they tell me the  order for the walker is in processing and could take up to 4 weeks to be delivered.  I have their number and will continue to follow up on it, I want the walker just in case I need it if I get really sick again   Denies clinical concerns and sounds to be in no distress throughout [facility] 30-day program outreach call today  Patient Reported Symptoms: Cognitive Cognitive Status: Alert and oriented to person, place, and time, Insightful and able to interpret abstract concepts, Normal speech and language skills Cognitive/Intellectual Conditions Management [RPT]: None reported or documented in medical history or problem list   Health Maintenance Behaviors: Annual physical exam, Healthy diet Health Facilitated by: Healthy diet, Rest  Neurological Neurological Review of Symptoms: No symptoms reported    HEENT HEENT Symptoms Reported: No symptoms reported      Cardiovascular Cardiovascular Symptoms Reported: No symptoms reported Other Cardiovascular Symptoms: Reinforced rationale for daily weight monitoring at home along with weight gain guidelines/ action plan for weight gain; importance of taking diuretic as prescribed: patient verbalizes good understanding of same Does patient have uncontrolled Hypertension?: No Cardiovascular Conditions: Heart failure, Hypertension Cardiovascular Management Strategies: Routine screening, Medication therapy, Diet modification, Coping strategies, Fluid modification, Weight management Do You Have a Working Readable Scale?: Yes Weight: 177 lb (80.3 kg) (home reported weight from 04/06/24)  Respiratory Respiratory Symptoms Reported: No symptoms reported Other Respiratory Symptoms: Reports I am breathing better than I have  in a long time; still using home O2 and nebulizers/ inhalers; still not smoking Additional Respiratory Details: Confirmed/ reinforced action plan for shortness of breath: verbalizes excellent ongoing understanding/ adherence to same; positive  reinforcement provided for ongoing efforts in smoking cessation Respiratory Conditions: COPD, Shortness of breath  Endocrine Patient reports the following symptoms related to hypoglycemia or hyperglycemia : No symptoms reported Is patient diabetic?: Yes (chronic prednisone  therapy) Is patient checking blood sugars at home?: Yes Endocrine Conditions: Diabetes Endocrine Management Strategies: Medication therapy, Routine screening, Diet modification, Coping strategies  Gastrointestinal Gastrointestinal Symptoms Reported: No symptoms reported Other Gastrointestinal Symptoms: Reports normal and regular BM's; good appetite Gastrointestinal Management Strategies: Coping strategies Nutrition Risk Screen (CP): No indicators present  Genitourinary Genitourinary Symptoms Reported: No symptoms reported Other Genitourinary Symptoms: Reports peeing normal and in good amounts, clear yellow urine Genitourinary Management Strategies: Coping strategies  Integumentary Integumentary Symptoms Reported: No symptoms reported    Musculoskeletal Musculoskelatal Symptoms Reviewed: No symptoms reported Additional Musculoskeletal Details: Reports getting stronger every day; confirmed not currently requiring/ using assistive devices for ambulation, rolling walker order still pending - has contacted [employer] and was told order processing/ delivery could take up to 4 weeks; confirms she has also started going back to the gym for light workouts and doing well with it; confirms home health PT remains active with next expected home visit this afternoon Musculoskeletal Management Strategies: Routine screening, Coping strategies, Activity      Psychosocial Psychosocial Symptoms Reported: No symptoms reported         There were no vitals filed for this visit.  Medications Reviewed Today     Reviewed by Kyon Bentler M, RN (Registered Nurse) on 04/06/24 at 1011  Med List Status: <None>   Medication Order  Taking? Sig Documenting Provider Last Dose Status Informant  acetaminophen  (TYLENOL ) 500 MG tablet [id number]  Take 1,000 mg by mouth 2 (two) times daily as needed for headache, fever, moderate pain or mild pain. [provider]  Active Pharmacy Records, Self  albuterol  (VENTOLIN  HFA) 108 (90 Base) MCG/ACT inhaler [id number]  INHALE 1-2 PUFFS BY MOUTH EVERY 6 HOURS AS NEEDED FOR WHEEZE OR SHORTNESS OF Standley Earing, MD  Active Pharmacy Records, Self  alendronate  (FOSAMAX ) 70 MG tablet [id number]  Take 70 mg by mouth once a week. [provider]  Active Pharmacy Records, Self  amLODipine  (NORVASC ) 5 MG tablet [id number]  TAKE 1 TABLET (5 MG TOTAL) BY MOUTH DAILY. Hugh Madura, MD  Active Pharmacy Records, Self  atorvastatin  (LIPITOR) 40 MG tablet [id number]  TAKE 1 TABLET BY MOUTH EVERY DAY Roslyn Coombe, MD  Active   bisoprolol  (ZEBETA ) 5 MG tablet [id number]  TAKE 1 TABLET (5 MG TOTAL) BY MOUTH DAILY. Hugh Madura, MD  Active Pharmacy Records, Self  Cholecalciferol  (VITAMIN D -3 PO) [id number]  Take 1 capsule by mouth in the morning. [provider]  Active Pharmacy Records, Self  Cyanocobalamin  (VITAMIN B-12 PO) [id number]  Take 1 tablet by mouth in the morning. [provider]  Active Pharmacy Records, Self  Dulaglutide  (TRULICITY ) 3 MG/0.5ML SOAJ [id number]  Inject 3 mg as directed once a week.  Patient taking differently: Inject 1.5 mg as directed once a week.   Roslyn Coombe, MD  Active Pharmacy Records, Self  Dupilumab  (DUPIXENT ) 300 MG/2ML Stevens Eland [id number]  Inject 300 mg into the skin every 14 (fourteen) days.  Patient not taking: Reported on 04/05/2024   Lind Repine, MD  Active Pharmacy Records,  Self           Med Note (Kalab Camps M   Wed Mar 24, 2024 11:18 AM) 03/24/24:  Reports during TOC call, pulmonary provider instructed her to hold this medication indefinitely- she is not currently taking  fluticasone  (FLONASE ) 50 MCG/ACT nasal spray [id number]   Place 1 spray into both nostrils daily. Vernestine Gondola, PA-C  Active Pharmacy Records, Self  Fluticasone -Umeclidin-Vilant (TRELEGY ELLIPTA ) 200-62.5-25 MCG/ACT AEPB [id number]  INHALE 1 PUFF BY MOUTH EVERY DAY Lind Repine, MD  Active Pharmacy Records, Self  guaiFENesin -dextromethorphan  (ROBITUSSIN DM) 100-10 MG/5ML syrup [id number]  Take 5 mLs by mouth every 4 (four) hours as needed for cough. Pokhrel, Laxman, MD  Active   insulin  aspart (NOVOLOG  FLEXPEN) 100 UNIT/ML FlexPen [id number]  Three times daily for meals. CBG 70 - 120: 0 units CBG 121 - 150: 3 units CBG 151 - 200: 4 units CBG 201 - 250: 7 units CBG 251 - 300: 11 units CBG 301 - 350: 15 units CBG 351 - 400: 20 units Pokhrel, Laxman, MD  Active   Insulin  Pen Needle 31G X 8 MM MISC [id number]  Use as directed with insulin  pens  Patient not taking: Reported on 04/05/2024   Rosena Conradi, MD  Active   ipratropium-albuterol  (DUONEB) 0.5-2.5 (3) MG/3ML SOLN [id number]  INHALE 3 ML BY NEBULIZER EVERY 6 HOURS AS NEEDED Lind Repine, MD  Active Pharmacy Records, Self  isosorbide  mononitrate (IMDUR ) 60 MG 24 hr tablet [id number]  TAKE 1.5 TABLETS (90 MG TOTAL) BY MOUTH DAILY. Hugh Madura, MD  Active Pharmacy Records, Self  KLOR-CON  M10 10 MEQ tablet [id number]  TAKE 1 TABLET BY MOUTH EVERY DAY  Patient not taking: Reported on 04/05/2024   Roslyn Coombe, MD  Active Pharmacy Records, Self  MAGNESIUM  OXIDE PO [id number]  Take 1 tablet by mouth at bedtime. [provider]  Active Pharmacy Records, Self  metFORMIN  (GLUCOPHAGE -XR) 500 MG 24 hr tablet [id number]  TAKE 2 TABLET BY MOUTH EVERY DAY WITH BREAKFAST John, James W, MD  Active Pharmacy Records, Self  Multiple Vitamins-Minerals (MULTIVITAMIN WOMEN 50+) TABS [id number]  Take 1 tablet by mouth in the morning. [provider]  Active Pharmacy Records, Self  OXYGEN  [id number]  Inhale 3 L into the lungs continuous. [provider]  Active Self  predniSONE  (DELTASONE ) 10 MG tablet  [id number]  Take 6 tablets (60 mg) daily for 2 days, then, Take 5 tablets (50 mg) daily for 2 days, then, Take 4 tablets (40 mg) daily for 2 days, then, Take 3 tablets (30 mg) daily for 2 days, then Take 2 tablets (20mg ) daily for 2 days then start your home dose of once a day  Patient not taking: Reported on 04/05/2024   Pokhrel, Laxman, MD  Active   predniSONE  (DELTASONE ) 10 MG tablet [id number]  Take 1 tablet (10 mg total) by mouth daily with breakfast. After completing taper Pokhrel, Laxman, MD  Active   torsemide  (DEMADEX ) 20 MG tablet [id number]  Take 1 tablet (20 mg total) by mouth daily. Pokhrel, Laxman, MD  Active            Recommendation:   Continue Current Plan of Care  Follow Up Plan:   Telephone follow-up in 1 week- as scheduled 04/14/24  Plan for next week's call: Review ongoing medication adherence- ensure no new concerns/ questions   Confirm [facility] home health remains   Discuss ongoing smoking cessation efforts with patient- ? Still not smoking since 03/18/24 ?  Reinforce action plan for COPD: monitoring SaO2 and daily weights at home: review both/ use of nebulizers/ home O2/ rescue inhalers   Review home values for daily weights, SaO2, blood sugars Review any upcoming provider appointments  Pls call/ message for questions,  Stonewall Doss Mckinney Michelangelo Rindfleisch, RN, BSN, CCRN Alumnus RN Care Manager  Transitions of Care  [facility] - [facility] [phone]: direct office

## 2024-04-06 NOTE — Patient Instructions (Signed)
 Visit Information  Thank you for taking time to visit with me today. Please don't hesitate to contact me if I can be of assistance to you before our next scheduled telephone appointment.  Our next appointment is by telephone on Wednesday 04/14/24 at 10:100 am  Please call the care guide team at [phone] if you need to cancel or reschedule your appointment.   Following are the goals we discussed today:  Patient Self Care Activities: Attend all scheduled provider appointments Call provider office for new concerns or questions  Participate in Transition of Care Program/Attend TOC scheduled calls Take medications as prescribed   Great job not smoking since you have come home from the hospital-- Please continue NOT SMOKING- this is the most important thing you can do to improve your health and your breathing Continue pacing activity to avoid episodes of shortness of breath Continue using home oxygen  as prescribed- continue monitoring your oxygen  levels at home frequently Continue to follow your established action plan for episodes of shortness of breath- seek emergency care if you feel your condition is worsening and you are struggling to breathe Continue working with the home health team that is involved in your care once they make their first home visit If you believe your condition is getting worse- contact your care providers (doctors) promptly- reaching out to your doctor early when you have concerns can prevent you from having to go to the hospital  If you are experiencing a Mental Health or Behavioral Health Crisis or need someone to talk to, please  call the Suicide and Crisis Lifeline: 988 call the [place]  National Suicide Prevention Lifeline: 7047164615 or TTY: 201-832-8277 TTY 5176254577) to talk to a trained counselor call 1-800-273-TALK (toll free, 24 hour hotline) go to [facility] [address] [place] [phone]) call the  [facility]: [facility] Crisis Line: [phone] call 911   Patient verbalizes understanding of instructions and care plan provided today and agrees to view in MyChart. Active MyChart status and patient understanding of how to access instructions and care plan via MyChart confirmed with patient.     Freddie Dymek Mckinney Kanchan Gal, RN, BSN, Media planner  Transitions of Care  [facility] - [facility] [phone]: direct office

## 2024-04-07 ENCOUNTER — Other Ambulatory Visit: Payer: Self-pay

## 2024-04-09 ENCOUNTER — Other Ambulatory Visit: Payer: Self-pay

## 2024-04-09 NOTE — Progress Notes (Signed)
 Therapy ended due to lack of efficacy. Disenrolling.

## 2024-04-13 ENCOUNTER — Encounter: Payer: Self-pay | Admitting: Internal Medicine

## 2024-04-14 ENCOUNTER — Other Ambulatory Visit: Payer: Self-pay | Admitting: *Deleted

## 2024-04-14 ENCOUNTER — Other Ambulatory Visit: Payer: Self-pay

## 2024-04-14 DIAGNOSIS — J9621 Acute and chronic respiratory failure with hypoxia: Secondary | ICD-10-CM | POA: Diagnosis not present

## 2024-04-14 DIAGNOSIS — E785 Hyperlipidemia, unspecified: Secondary | ICD-10-CM | POA: Diagnosis not present

## 2024-04-14 DIAGNOSIS — L299 Pruritus, unspecified: Secondary | ICD-10-CM | POA: Diagnosis not present

## 2024-04-14 DIAGNOSIS — M5416 Radiculopathy, lumbar region: Secondary | ICD-10-CM | POA: Diagnosis not present

## 2024-04-14 DIAGNOSIS — I2510 Atherosclerotic heart disease of native coronary artery without angina pectoris: Secondary | ICD-10-CM | POA: Diagnosis not present

## 2024-04-14 DIAGNOSIS — J9622 Acute and chronic respiratory failure with hypercapnia: Secondary | ICD-10-CM | POA: Diagnosis not present

## 2024-04-14 DIAGNOSIS — D751 Secondary polycythemia: Secondary | ICD-10-CM | POA: Diagnosis not present

## 2024-04-14 DIAGNOSIS — G4719 Other hypersomnia: Secondary | ICD-10-CM | POA: Diagnosis not present

## 2024-04-14 DIAGNOSIS — I051 Rheumatic mitral insufficiency: Secondary | ICD-10-CM | POA: Diagnosis not present

## 2024-04-14 DIAGNOSIS — I700 Atherosclerosis of aorta: Secondary | ICD-10-CM | POA: Diagnosis not present

## 2024-04-14 DIAGNOSIS — G4733 Obstructive sleep apnea (adult) (pediatric): Secondary | ICD-10-CM | POA: Diagnosis not present

## 2024-04-14 DIAGNOSIS — E1165 Type 2 diabetes mellitus with hyperglycemia: Secondary | ICD-10-CM | POA: Diagnosis not present

## 2024-04-14 DIAGNOSIS — J309 Allergic rhinitis, unspecified: Secondary | ICD-10-CM | POA: Diagnosis not present

## 2024-04-14 DIAGNOSIS — G4734 Idiopathic sleep related nonobstructive alveolar hypoventilation: Secondary | ICD-10-CM | POA: Diagnosis not present

## 2024-04-14 DIAGNOSIS — E559 Vitamin D deficiency, unspecified: Secondary | ICD-10-CM | POA: Diagnosis not present

## 2024-04-14 DIAGNOSIS — I110 Hypertensive heart disease with heart failure: Secondary | ICD-10-CM | POA: Diagnosis not present

## 2024-04-14 DIAGNOSIS — E873 Alkalosis: Secondary | ICD-10-CM | POA: Diagnosis not present

## 2024-04-14 DIAGNOSIS — E872 Acidosis, unspecified: Secondary | ICD-10-CM | POA: Diagnosis not present

## 2024-04-14 DIAGNOSIS — J329 Chronic sinusitis, unspecified: Secondary | ICD-10-CM | POA: Diagnosis not present

## 2024-04-14 DIAGNOSIS — A5901 Trichomonal vulvovaginitis: Secondary | ICD-10-CM | POA: Diagnosis not present

## 2024-04-14 DIAGNOSIS — J441 Chronic obstructive pulmonary disease with (acute) exacerbation: Secondary | ICD-10-CM | POA: Diagnosis not present

## 2024-04-14 DIAGNOSIS — G4700 Insomnia, unspecified: Secondary | ICD-10-CM | POA: Diagnosis not present

## 2024-04-14 DIAGNOSIS — I5033 Acute on chronic diastolic (congestive) heart failure: Secondary | ICD-10-CM | POA: Diagnosis not present

## 2024-04-14 DIAGNOSIS — K760 Fatty (change of) liver, not elsewhere classified: Secondary | ICD-10-CM | POA: Diagnosis not present

## 2024-04-14 DIAGNOSIS — J398 Other specified diseases of upper respiratory tract: Secondary | ICD-10-CM | POA: Diagnosis not present

## 2024-04-14 MED ORDER — TORSEMIDE 20 MG PO TABS: 20.0000 mg | ORAL_TABLET | Freq: Every day | ORAL | 2 refills | Status: DC

## 2024-04-14 NOTE — Patient Instructions (Signed)
 Visit Information  Thank you for taking time to visit with me today. Please don't hesitate to contact me if I can be of assistance to you before our next scheduled telephone appointment.  Our next appointment is by telephone on Tuesday 04/20/24 at 11:00 am  Please call the care guide team at [phone] if you need to cancel or reschedule your appointment.   Following are the goals we discussed today:  Patient Self Care Activities: Attend all scheduled provider appointments Call provider office for new concerns or questions  Participate in Transition of Care Program/Attend TOC scheduled calls Take medications as prescribed   Great job not smoking since you have come home from the hospital-- Please continue NOT SMOKING- this is the most important thing you can do to improve your health and your breathing Continue pacing activity to avoid episodes of shortness of breath Continue using home oxygen  as prescribed- continue monitoring your oxygen  levels at home frequently Continue to follow your established action plan for episodes of shortness of breath- seek emergency care if you feel your condition is worsening and you are struggling to breathe Continue working with the home health team that is involved in your care once they make their first home visit If you believe your condition is getting worse- contact your care providers (doctors) promptly- reaching out to your doctor early when you have concerns can prevent you from having to go to the hospital  If you are experiencing a Mental Health or Behavioral Health Crisis or need someone to talk to, please  call the Suicide and Crisis Lifeline: 988 call the [place]  National Suicide Prevention Lifeline: 805-839-2665 or TTY: (720)831-7101 TTY 914-178-1221) to talk to a trained counselor call 1-800-273-TALK (toll free, 24 hour hotline) go to [facility] [address], [place] [phone]) call the [facility] Crisis Line: [phone] call 911   Patient verbalizes understanding of instructions and care plan provided today and agrees to view in MyChart. Active MyChart status and patient understanding of how to access instructions and care plan via MyChart confirmed with patient.     Pls call/ message for questions,  Cristan Scherzer Mckinney Zaineb Nowaczyk, RN, BSN, CCRN Alumnus RN Care Manager  Transitions of Care  [facility] - [facility] [phone]: direct office

## 2024-04-14 NOTE — Transitions of Care (Post Inpatient/ED Visit) (Signed)
 Transition of Care week 4/ day # 22  Visit Note  04/14/2024  Name: [name] MRN: [id number]          DOB: 1956-02-21  Situation: Patient enrolled in [facility] 30-day program. Visit completed with patient by telephone.   HIPAA identifiers x 2 verified   Background:  Recent Hospitalizations- 2 within 30 days of each other- both for treatment of COPD: Acute on Chronic Respiratory Failure in setting of ongoing smoking Recent hospital admission May 26-28, 2025 for acute on chronic respiratory failure in setting of chronic home O2- patient declined hospice services at time of hospitalization/ hospital discharge Readmission less than 24-hours post 03/17/24 discharge:  May 29-March 23, 2024: Acute on Chronic Respiratory Failure/ end-stage chronic respiratory failure Fragile state of health, multiple progressing chronic health conditions- ongoing tobacco use- 03/24/24: has not smoked since 03/18/24 3 unplanned hospital admissions x last 6/ 12 months- all for acute on chronic respiratory failure  Initial Transition Care Management Follow-up Telephone Call    Past Medical History:  Diagnosis Date   Bronchitis    CAD (coronary artery disease)    a. 02/2002: CABG x3 with LIMA to LAD, RIMA to RCA, and SVG to OM   CHF (congestive heart failure) (HCC)    COPD (chronic obstructive pulmonary disease) (HCC)    Diabetes (HCC)    Headache    Heart disease    Hyperlipidemia    Hypertension    Hypertensive emergency 07/01/2013   Impaired glucose tolerance 08/20/2014   Sinusitis    Assessment: I am still breathing great- no problems at all.  I can't talk long--- home health PT is here now.  Haven't gotten the walker yet, but am staying in touch with [employer]- and right now, I don't even need it- so not worried about it right now.  Still not smoking; no problems at all.    Denies clinical concerns and sounds to be in no distress throughout [facility] 30-day program outreach call today  Patient Reported  Symptoms: Cognitive Cognitive Status: Alert and oriented to person, place, and time, Insightful and able to interpret abstract concepts, Normal speech and language skills Cognitive/Intellectual Conditions Management [RPT]: None reported or documented in medical history or problem list      Neurological Neurological Review of Symptoms: No symptoms reported    HEENT HEENT Symptoms Reported: No symptoms reported      Cardiovascular Cardiovascular Symptoms Reported: No symptoms reported Other Cardiovascular Symptoms: Confirmed continues to manage and monitor -record daily weights at home; reviewed recent weights with patient: she continues to report consistent weight ranges at home between 177-180 lbs; denies weight gain > 3 lbs overnight/ 5 lbs in one week Does patient have uncontrolled Hypertension?: No Cardiovascular Conditions: Hypertension, Heart failure Cardiovascular Management Strategies: Routine screening, Diet modification, Coping strategies, Medication therapy, Fluid modification, Weight management Do You Have a Working Readable Scale?: Yes Weight: 180 lb (81.6 kg) (home reported value from 04/14/24)  Respiratory Respiratory Symptoms Reported: No symptoms reported Other Respiratory Symptoms: Reports I am breathing better than I have in years- no changes, still not smoking; still using home O2 and nebulizer.  Avoiding the heat.  Doing just fine  Sounds to be in no distress throughout [facility] call Additional Respiratory Details: Reinforced action plan for shortness of breath- patient continues to verbalize excellent understanding of same without prompting Respiratory Conditions: COPD, Shortness of breath  Endocrine Patient reports the following symptoms related to hypoglycemia or hyperglycemia : No symptoms reported Is patient diabetic?:  Yes Is patient checking blood sugars at home?: Yes Endocrine Conditions: Diabetes Endocrine Management Strategies: Routine screening, Medication therapy,  Diet modification, Coping strategies  Gastrointestinal Gastrointestinal Symptoms Reported: No symptoms reported      Genitourinary Genitourinary Symptoms Reported: No symptoms reported    Integumentary Integumentary Symptoms Reported: No symptoms reported    Musculoskeletal Musculoskelatal Symptoms Reviewed: No symptoms reported Additional Musculoskeletal Details: confirmed not currently requiring/ using assistive devices for ambulation; confirmed continues to work with home health PT        Psychosocial Psychosocial Symptoms Reported: No symptoms reported         There were no vitals filed for this visit.  Medications Reviewed Today     Reviewed by Drake Landing M, RN (Registered Nurse) on 04/14/24 at [phone]  Med List Status: <None>   Medication Order Taking? Sig Documenting Provider Last Dose Status Informant  acetaminophen  (TYLENOL ) 500 MG tablet [id number]  Take 1,000 mg by mouth 2 (two) times daily as needed for headache, fever, moderate pain or mild pain. [provider]  Active Pharmacy Records, Self  albuterol  (VENTOLIN  HFA) 108 (90 Base) MCG/ACT inhaler [id number]  INHALE 1-2 PUFFS BY MOUTH EVERY 6 HOURS AS NEEDED FOR WHEEZE OR SHORTNESS OF SHERIDA [name] Harden LULLA, MD  Active Pharmacy Records, Self  alendronate  (FOSAMAX ) 70 MG tablet [id number]  Take 70 mg by mouth once a week. [provider]  Active Pharmacy Records, Self  amLODipine  (NORVASC ) 5 MG tablet [id number]  TAKE 1 TABLET (5 MG TOTAL) BY MOUTH DAILY. Jeffrie Oneil BROCKS, MD  Active   atorvastatin  (LIPITOR) 40 MG tablet [id number]  TAKE 1 TABLET BY MOUTH EVERY DAY Norleen Lynwood ORN, MD  Active   bisoprolol  (ZEBETA ) 5 MG tablet [id number]  TAKE 1 TABLET (5 MG TOTAL) BY MOUTH DAILY. Jeffrie Oneil BROCKS, MD  Active Pharmacy Records, Self  Cholecalciferol  (VITAMIN D -3 PO) [id number]  Take 1 capsule by mouth in the morning. [provider]  Active Pharmacy Records, Self  Cyanocobalamin  (VITAMIN B-12 PO) [id number]  Take  1 tablet by mouth in the morning. [provider]  Active Pharmacy Records, Self  Dulaglutide  (TRULICITY ) 3 MG/0.5ML SOAJ [id number]  Inject 3 mg as directed once a week.  Patient taking differently: Inject 1.5 mg as directed once a week.   Norleen Lynwood ORN, MD  Active Pharmacy Records, Self  Dupilumab  (DUPIXENT ) 300 MG/2ML EMMANUEL [id number]  Inject 300 mg into the skin every 14 (fourteen) days.  Patient not taking: Reported on 04/05/2024   [name] Harden LULLA, MD  Active Pharmacy Records, Self           Med Note (Arshi Duarte M   Wed Mar 24, 2024 11:18 AM) 03/24/24:  Reports during TOC call, pulmonary provider instructed her to hold this medication indefinitely- she is not currently taking  fluticasone  (FLONASE ) 50 MCG/ACT nasal spray [id number]  Place 1 spray into both nostrils daily. Billy Asberry JULIANNA DEVONNA  Active Pharmacy Records, Self  Fluticasone -Umeclidin-Vilant (TRELEGY ELLIPTA ) 200-62.5-25 MCG/ACT AEPB [id number]  INHALE 1 PUFF BY MOUTH EVERY DAY [name] Harden LULLA, MD  Active Pharmacy Records, Self  guaiFENesin -dextromethorphan  (ROBITUSSIN DM) 100-10 MG/5ML syrup [id number]  Take 5 mLs by mouth every 4 (four) hours as needed for cough. Pokhrel, Laxman, MD  Active   insulin  aspart (NOVOLOG  FLEXPEN) 100 UNIT/ML FlexPen [id number]  Three times daily for meals. CBG 70 - 120: 0 units CBG 121 - 150: 3 units CBG 151 - 200: 4 units CBG 201 - 250: 7 units  CBG 251 - 300: 11 units CBG 301 - 350: 15 units CBG 351 - 400: 20 units Pokhrel, Laxman, MD  Active   Insulin  Pen Needle 31G X 8 MM MISC [id number]  Use as directed with insulin  pens  Patient not taking: Reported on 04/05/2024   Sonjia Held, MD  Active   ipratropium-albuterol  (DUONEB) 0.5-2.5 (3) MG/3ML SOLN [id number]  INHALE 3 ML BY NEBULIZER EVERY 6 HOURS AS NEEDED [name] Harden GAILS, MD  Active Pharmacy Records, Self  isosorbide  mononitrate (IMDUR ) 60 MG 24 hr tablet [id number]  TAKE 1.5 TABLETS (90 MG TOTAL) BY MOUTH DAILY. Jeffrie Oneil BROCKS, MD  Active Pharmacy  Records, Self  KLOR-CON  M10 10 MEQ tablet [id number]  TAKE 1 TABLET BY MOUTH EVERY DAY  Patient not taking: Reported on 04/05/2024   Norleen Lynwood ORN, MD  Active Pharmacy Records, Self  MAGNESIUM  OXIDE PO [id number]  Take 1 tablet by mouth at bedtime. [provider]  Active Pharmacy Records, Self  metFORMIN  (GLUCOPHAGE -XR) 500 MG 24 hr tablet [id number]  TAKE 2 TABLET BY MOUTH EVERY DAY WITH BREAKFAST John, James W, MD  Active Pharmacy Records, Self  Multiple Vitamins-Minerals (MULTIVITAMIN WOMEN 50+) TABS [id number]  Take 1 tablet by mouth in the morning. [provider]  Active Pharmacy Records, Self  OXYGEN  [id number]  Inhale 3 L into the lungs continuous. [provider]  Active Self  predniSONE  (DELTASONE ) 10 MG tablet [id number]  Take 6 tablets (60 mg) daily for 2 days, then, Take 5 tablets (50 mg) daily for 2 days, then, Take 4 tablets (40 mg) daily for 2 days, then, Take 3 tablets (30 mg) daily for 2 days, then Take 2 tablets (20mg ) daily for 2 days then start your home dose of once a day  Patient not taking: Reported on 04/05/2024   Pokhrel, Laxman, MD  Active   predniSONE  (DELTASONE ) 10 MG tablet [id number]  Take 1 tablet (10 mg total) by mouth daily with breakfast. After completing taper Pokhrel, Laxman, MD  Active   torsemide  (DEMADEX ) 20 MG tablet [id number]  Take 1 tablet (20 mg total) by mouth daily. Norleen Lynwood ORN, MD  Active            Recommendation:   Continue Current Plan of Care  Follow Up Plan:   Telephone follow-up in 1 week- as scheduled 04/20/24  Plan for next week's call: Review ongoing medication adherence- ensure no new concerns/ questions   Confirm [facility] home health remains active Discuss ongoing smoking cessation efforts with patient- ? Still not smoking since 04/14/24 ?   Reinforce action plan for COPD: monitoring SaO2 and daily weights at home: review both/ use of nebulizers/ home O2/ rescue inhalers   Review home values for daily weights,  SaO2, blood sugars Review any upcoming provider appointments  Pls call/ message for questions,  Sela Falk Mckinney Carlisa Eble, RN, BSN, CCRN Alumnus RN Care Manager  Transitions of Care  [facility] - [facility] [phone]: direct office

## 2024-04-18 DIAGNOSIS — J449 Chronic obstructive pulmonary disease, unspecified: Secondary | ICD-10-CM | POA: Diagnosis not present

## 2024-04-18 DIAGNOSIS — J9610 Chronic respiratory failure, unspecified whether with hypoxia or hypercapnia: Secondary | ICD-10-CM | POA: Diagnosis not present

## 2024-04-20 ENCOUNTER — Other Ambulatory Visit: Payer: Self-pay | Admitting: *Deleted

## 2024-04-20 NOTE — Patient Instructions (Signed)
 Visit Information  Thank you for taking time to visit with me today. Please don't hesitate to contact me if I can be of assistance to you before our next scheduled telephone appointment.  Our next appointment is by telephone on Monday 04/26/24 at 10:00 am  Please call the care guide team at [phone] if you need to cancel or reschedule your appointment.   Following are the goals we discussed today:  Patient Self Care Activities: Attend all scheduled provider appointments Call provider office for new concerns or questions  Participate in Transition of Care Program/Attend TOC scheduled calls Take medications as prescribed   Great job not smoking since you have come home from the hospital-- Please continue NOT SMOKING- this is the most important thing you can do to improve your health and your breathing Continue pacing activity to avoid episodes of shortness of breath Continue using home oxygen  as prescribed- continue monitoring your oxygen  levels at home frequently Continue to follow your established action plan for episodes of shortness of breath- seek emergency care if you feel your condition is worsening and you are struggling to breathe Continue working with the home health team that is involved in your care once they make their first home visit If you believe your condition is getting worse- contact your care providers (doctors) promptly- reaching out to your doctor early when you have concerns can prevent you from having to go to the hospital  If you are experiencing a Mental Health or Behavioral Health Crisis or need someone to talk to, please  call the Suicide and Crisis Lifeline: 988 call the [place]  National Suicide Prevention Lifeline: 4181106419 or TTY: 7096344407 TTY 305-094-0119) to talk to a trained counselor call 1-800-273-TALK (toll free, 24 hour hotline) go to [facility] [address], [place] [phone]) call the [facility] Crisis Line: [phone] call 911   Patient verbalizes understanding of instructions and care plan provided today and agrees to view in MyChart. Active MyChart status and patient understanding of how to access instructions and care plan via MyChart confirmed with patient.     Pls call/ message for questions,  Sumedha Munnerlyn Mckinney Aliena Ghrist, RN, BSN, CCRN Alumnus RN Care Manager  Transitions of Care  [facility] - [facility] [phone]: direct office

## 2024-04-20 NOTE — Transitions of Care (Post Inpatient/ED Visit) (Signed)
 Transition of Care week # 5/ day # 27  Visit Note  04/20/2024  Name: [name] MRN: [id number]          DOB: April 25, 1956  Situation: Patient enrolled in [facility] 30-day program. Visit completed with patient by telephone.   HIPAA identifiers x 2 verified  Background:  Recent Hospitalizations- 2 within 30 days of each other- both for treatment of COPD: Acute on Chronic Respiratory Failure in setting of ongoing smoking Recent hospital admission May 26-28, 2025 for acute on chronic respiratory failure in setting of chronic home O2- patient declined hospice services at time of hospitalization/ hospital discharge Readmission less than 24-hours post 03/17/24 discharge:  May 29-March 23, 2024: Acute on Chronic Respiratory Failure/ end-stage chronic respiratory failure Fragile state of health, multiple progressing chronic health conditions- ongoing tobacco use- 03/24/24: has not smoked since 03/18/24 3 unplanned hospital admissions x last 6/ 12 months- all for acute on chronic respiratory failure  Initial Transition Care Management Follow-up Telephone Call    Past Medical History:  Diagnosis Date   Bronchitis    CAD (coronary artery disease)    a. 02/2002: CABG x3 with LIMA to LAD, RIMA to RCA, and SVG to OM   CHF (congestive heart failure) (HCC)    COPD (chronic obstructive pulmonary disease) (HCC)    Diabetes (HCC)    Headache    Heart disease    Hyperlipidemia    Hypertension    Hypertensive emergency 07/01/2013   Impaired glucose tolerance 08/20/2014   Sinusitis    Assessment:  I am still breathing great- better than I have in years; no problems at all, no flare ups of shortness of breath at all.  Still not smoking; and all my home numbers look fine; going to the gym and home health PT is still coming out to work with me every week.  I am having no problems at all.    Denies clinical concerns and sounds to be in no distress throughout [facility] 30-day program outreach call today  Patient  Reported Symptoms: Cognitive Cognitive Status: Able to follow simple commands, Insightful and able to interpret abstract concepts, Normal speech and language skills Cognitive/Intellectual Conditions Management [RPT]: None reported or documented in medical history or problem list      Neurological Neurological Review of Symptoms: No symptoms reported    HEENT HEENT Symptoms Reported: No symptoms reported      Cardiovascular Cardiovascular Symptoms Reported: No symptoms reported Other Cardiovascular Symptoms: Confirmed patient still/ currently obtaining daily weights at home: provided education / rationale for daily weight monitoring at home as earliest indicator of fluid retention, along with weight gain guidelines/ action plan for weight gain; importance of taking diuretic as prescribed; reviewed recent daily weights at home with patient: reports general weight ranges always between 180-182 lbs Does patient have uncontrolled Hypertension?: No Cardiovascular Management Strategies: Adequate rest, Coping strategies, Diet modification, Routine screening, Medication therapy, Weight management Do You Have a Working Readable Scale?: Yes Weight: 182 lb (82.6 kg) (home reported weight from 04/20/24)  Respiratory Respiratory Symptoms Reported: No symptoms reported Other Respiratory Symptoms: Continues to report breathing better than I have in years; Denies recent flares of shortness of breath since last hospital discharge; sounds to be in no respiratory distress throughout [facility] call; confirmed continues using home O2 continuously between 2-4 L/min; provided reinforcement around activity restrictions/ considerations in setting of current hot weather trends- she verbalizes excellent understanding of same Additional Respiratory Details: Confirmed/ reinforced action plan for shortness of  breath: verbalizes excellent ongoing understanding/ adherence to same; Reviewed recent SaO2 readings at home: reports  always between 94-98% Respiratory Management Strategies: Adequate rest, Coping strategies, Oxygen  therapy, Routine screening, Medication therapy, Breathing techniques  Endocrine Endocrine Symptoms Reported: No symptoms reported Is patient diabetic?: Yes Is patient checking blood sugars at home?: Yes List most recent blood sugar readings, include date and time of day: Confirmed patient continues monitoring blood sugars at home; Reviewed recent blood sugars at home: patient reports today, Fasting blood sugar this morning of 116; none of the blood sugars have been > 160, before or after eating; Reports have not needed to use the sliding scale insulin  recently because the blood sugars are all under the scale start dose    Gastrointestinal Gastrointestinal Symptoms Reported: No symptoms reported Other Gastrointestinal Symptoms: Reports normal and regular BM's; confirms has to use Miralax  just about every day; confirms ongoinhg real good appetite Gastrointestinal Management Strategies: Medication therapy, Coping strategies    Genitourinary Genitourinary Symptoms Reported: No symptoms reported    Integumentary Integumentary Symptoms Reported: No symptoms reported    Musculoskeletal Musculoskelatal Symptoms Reviewed: No symptoms reported Additional Musculoskeletal Details: Confirms home health PT continues to visit her at home weekly- encouraged her ongoing participation/ engagement as long as services are in place Musculoskeletal Management Strategies: Routine screening, Coping strategies, Activity      Psychosocial Psychosocial Symptoms Reported: No symptoms reported         Vitals:   04/20/24 1110  BP: 115/60  SpO2: 98%    Medications Reviewed Today     Reviewed by Lynia Landry M, RN (Registered Nurse) on 04/20/24 at 1111  Med List Status: <None>   Medication Order Taking? Sig Documenting Provider Last Dose Status Informant  acetaminophen  (TYLENOL ) 500 MG tablet [id number]   Take 1,000 mg by mouth 2 (two) times daily as needed for headache, fever, moderate pain or mild pain. [provider]  Active Pharmacy Records, Self  albuterol  (VENTOLIN  HFA) 108 (90 Base) MCG/ACT inhaler [id number]  INHALE 1-2 PUFFS BY MOUTH EVERY 6 HOURS AS NEEDED FOR WHEEZE OR SHORTNESS OF SHERIDA [name] Harden LULLA, MD  Active Pharmacy Records, Self  alendronate  (FOSAMAX ) 70 MG tablet [id number]  Take 70 mg by mouth once a week. [provider]  Active Pharmacy Records, Self  amLODipine  (NORVASC ) 5 MG tablet [id number]  TAKE 1 TABLET (5 MG TOTAL) BY MOUTH DAILY. Jeffrie Oneil BROCKS, MD  Active   atorvastatin  (LIPITOR) 40 MG tablet [id number]  TAKE 1 TABLET BY MOUTH EVERY DAY Norleen Lynwood ORN, MD  Active   bisoprolol  (ZEBETA ) 5 MG tablet [id number]  TAKE 1 TABLET (5 MG TOTAL) BY MOUTH DAILY. Jeffrie Oneil BROCKS, MD  Active Pharmacy Records, Self  Cholecalciferol  (VITAMIN D -3 PO) [id number]  Take 1 capsule by mouth in the morning. [provider]  Active Pharmacy Records, Self  Cyanocobalamin  (VITAMIN B-12 PO) [id number]  Take 1 tablet by mouth in the morning. [provider]  Active Pharmacy Records, Self  Dulaglutide  (TRULICITY ) 3 MG/0.5ML SOAJ [id number]  Inject 3 mg as directed once a week.  Patient taking differently: Inject 1.5 mg as directed once a week.   Norleen Lynwood ORN, MD  Active Pharmacy Records, Self  Dupilumab  (DUPIXENT ) 300 MG/2ML EMMANUEL [id number]  Inject 300 mg into the skin every 14 (fourteen) days.  Patient not taking: Reported on 04/05/2024   [name] Harden LULLA, MD  Active Pharmacy Records, Self           Med Note [place],  Zuria Fosdick M   Wed Mar 24, 2024 11:18 AM) 03/24/24:  Reports during TOC call, pulmonary provider instructed her to hold this medication indefinitely- she is not currently taking  fluticasone  (FLONASE ) 50 MCG/ACT nasal spray [id number]  Place 1 spray into both nostrils daily. Billy Asberry FALCON, PA-C  Active Pharmacy Records, Self  Fluticasone -Umeclidin-Vilant [facility]  ELLIPTA) 200-62.5-25 MCG/ACT AEPB [id number]  INHALE 1 PUFF BY MOUTH EVERY DAY [name] Harden GAILS, MD  Active Pharmacy Records, Self  guaiFENesin -dextromethorphan  (ROBITUSSIN DM) 100-10 MG/5ML syrup [id number]  Take 5 mLs by mouth every 4 (four) hours as needed for cough. Pokhrel, Laxman, MD  Active   insulin  aspart (NOVOLOG  FLEXPEN) 100 UNIT/ML FlexPen [id number]  Three times daily for meals. CBG 70 - 120: 0 units CBG 121 - 150: 3 units CBG 151 - 200: 4 units CBG 201 - 250: 7 units CBG 251 - 300: 11 units CBG 301 - 350: 15 units CBG 351 - 400: 20 units Pokhrel, Laxman, MD  Expired 04/17/24 2359   Insulin  Pen Needle 31G X 8 MM MISC [id number]  Use as directed with insulin  pens  Patient not taking: Reported on 04/05/2024   Sonjia Held, MD  Active   ipratropium-albuterol  (DUONEB) 0.5-2.5 (3) MG/3ML SOLN [id number]  INHALE 3 ML BY NEBULIZER EVERY 6 HOURS AS NEEDED [name] Harden GAILS, MD  Active Pharmacy Records, Self  isosorbide  mononitrate (IMDUR ) 60 MG 24 hr tablet [id number]  TAKE 1.5 TABLETS (90 MG TOTAL) BY MOUTH DAILY. Jeffrie Oneil BROCKS, MD  Active Pharmacy Records, Self  KLOR-CON  M10 10 MEQ tablet [id number]  TAKE 1 TABLET BY MOUTH EVERY DAY  Patient not taking: Reported on 04/05/2024   Norleen Lynwood ORN, MD  Active Pharmacy Records, Self  MAGNESIUM  OXIDE PO [id number]  Take 1 tablet by mouth at bedtime. [provider]  Active Pharmacy Records, Self  metFORMIN  (GLUCOPHAGE -XR) 500 MG 24 hr tablet [id number]  TAKE 2 TABLET BY MOUTH EVERY DAY WITH BREAKFAST John, James W, MD  Active Pharmacy Records, Self  Multiple Vitamins-Minerals (MULTIVITAMIN WOMEN 50+) TABS [id number]  Take 1 tablet by mouth in the morning. [provider]  Active Pharmacy Records, Self  OXYGEN  [id number]  Inhale 3 L into the lungs continuous. [provider]  Active Self  predniSONE  (DELTASONE ) 10 MG tablet [id number]  Take 6 tablets (60 mg) daily for 2 days, then, Take 5 tablets (50 mg) daily for 2 days, then, Take 4  tablets (40 mg) daily for 2 days, then, Take 3 tablets (30 mg) daily for 2 days, then Take 2 tablets (20mg ) daily for 2 days then start your home dose of once a day  Patient not taking: Reported on 04/05/2024   Pokhrel, Laxman, MD  Active   predniSONE  (DELTASONE ) 10 MG tablet [id number]  Take 1 tablet (10 mg total) by mouth daily with breakfast. After completing taper Pokhrel, Laxman, MD  Active   torsemide  (DEMADEX ) 20 MG tablet [id number]  Take 1 tablet (20 mg total) by mouth daily. Norleen Lynwood ORN, MD  Active            Recommendation:   Continue Current Plan of Care  Follow Up Plan:   Telephone follow-up in 1 week- as scheduled 04/26/24  Plan for next week's call: Review ongoing medication adherence- ensure no new concerns/ questions   Confirm [facility] home health PT remains active Re-confirm still not smoking  Reinforce action plan for COPD: monitoring SaO2 and daily weights at home: review both/ use of nebulizers/ home O2/  rescue inhalers   Review home values for daily weights, SaO2, blood sugars Review any upcoming provider appointments TOC 30-day program case closure with transfer to longitudinal RN CM if no hospital readmission  Pls call/ message for questions,  Cassey Hurrell Mckinney Rhyatt Muska, RN, BSN, CCRN Alumnus RN Care Manager  Transitions of Care  [facility] - [facility] [phone]: direct office

## 2024-04-21 DIAGNOSIS — L299 Pruritus, unspecified: Secondary | ICD-10-CM | POA: Diagnosis not present

## 2024-04-21 DIAGNOSIS — E559 Vitamin D deficiency, unspecified: Secondary | ICD-10-CM | POA: Diagnosis not present

## 2024-04-21 DIAGNOSIS — G4733 Obstructive sleep apnea (adult) (pediatric): Secondary | ICD-10-CM | POA: Diagnosis not present

## 2024-04-21 DIAGNOSIS — I110 Hypertensive heart disease with heart failure: Secondary | ICD-10-CM | POA: Diagnosis not present

## 2024-04-21 DIAGNOSIS — G4719 Other hypersomnia: Secondary | ICD-10-CM | POA: Diagnosis not present

## 2024-04-21 DIAGNOSIS — E873 Alkalosis: Secondary | ICD-10-CM | POA: Diagnosis not present

## 2024-04-21 DIAGNOSIS — E872 Acidosis, unspecified: Secondary | ICD-10-CM | POA: Diagnosis not present

## 2024-04-21 DIAGNOSIS — J9621 Acute and chronic respiratory failure with hypoxia: Secondary | ICD-10-CM | POA: Diagnosis not present

## 2024-04-21 DIAGNOSIS — J309 Allergic rhinitis, unspecified: Secondary | ICD-10-CM | POA: Diagnosis not present

## 2024-04-21 DIAGNOSIS — G4734 Idiopathic sleep related nonobstructive alveolar hypoventilation: Secondary | ICD-10-CM | POA: Diagnosis not present

## 2024-04-21 DIAGNOSIS — J329 Chronic sinusitis, unspecified: Secondary | ICD-10-CM | POA: Diagnosis not present

## 2024-04-21 DIAGNOSIS — G4700 Insomnia, unspecified: Secondary | ICD-10-CM | POA: Diagnosis not present

## 2024-04-21 DIAGNOSIS — I051 Rheumatic mitral insufficiency: Secondary | ICD-10-CM | POA: Diagnosis not present

## 2024-04-21 DIAGNOSIS — J398 Other specified diseases of upper respiratory tract: Secondary | ICD-10-CM | POA: Diagnosis not present

## 2024-04-21 DIAGNOSIS — J9622 Acute and chronic respiratory failure with hypercapnia: Secondary | ICD-10-CM | POA: Diagnosis not present

## 2024-04-21 DIAGNOSIS — M5416 Radiculopathy, lumbar region: Secondary | ICD-10-CM | POA: Diagnosis not present

## 2024-04-21 DIAGNOSIS — I700 Atherosclerosis of aorta: Secondary | ICD-10-CM | POA: Diagnosis not present

## 2024-04-21 DIAGNOSIS — A5901 Trichomonal vulvovaginitis: Secondary | ICD-10-CM | POA: Diagnosis not present

## 2024-04-21 DIAGNOSIS — D751 Secondary polycythemia: Secondary | ICD-10-CM | POA: Diagnosis not present

## 2024-04-21 DIAGNOSIS — J441 Chronic obstructive pulmonary disease with (acute) exacerbation: Secondary | ICD-10-CM | POA: Diagnosis not present

## 2024-04-21 DIAGNOSIS — E785 Hyperlipidemia, unspecified: Secondary | ICD-10-CM | POA: Diagnosis not present

## 2024-04-21 DIAGNOSIS — I5033 Acute on chronic diastolic (congestive) heart failure: Secondary | ICD-10-CM | POA: Diagnosis not present

## 2024-04-21 DIAGNOSIS — I2510 Atherosclerotic heart disease of native coronary artery without angina pectoris: Secondary | ICD-10-CM | POA: Diagnosis not present

## 2024-04-21 DIAGNOSIS — K760 Fatty (change of) liver, not elsewhere classified: Secondary | ICD-10-CM | POA: Diagnosis not present

## 2024-04-21 DIAGNOSIS — E1165 Type 2 diabetes mellitus with hyperglycemia: Secondary | ICD-10-CM | POA: Diagnosis not present

## 2024-04-22 DIAGNOSIS — J449 Chronic obstructive pulmonary disease, unspecified: Secondary | ICD-10-CM | POA: Diagnosis not present

## 2024-04-26 ENCOUNTER — Other Ambulatory Visit: Payer: Self-pay | Admitting: *Deleted

## 2024-04-26 VITALS — Wt 183.0 lb

## 2024-04-26 DIAGNOSIS — J9621 Acute and chronic respiratory failure with hypoxia: Secondary | ICD-10-CM

## 2024-04-26 DIAGNOSIS — J4489 Other specified chronic obstructive pulmonary disease: Secondary | ICD-10-CM

## 2024-04-26 DIAGNOSIS — I509 Heart failure, unspecified: Secondary | ICD-10-CM

## 2024-04-26 NOTE — Patient Instructions (Signed)
 Visit Information  Thank you for taking time to visit with me today. Please don't hesitate to contact me if I can be of assistance to you before our next scheduled telephone appointment.  It has been a pleasure working with you over the last 30 days!  Great job managing your care after your hospital visit!  I am glad that you are doing well!  Please listen out for the next nurse to call you within the next few weeks- she will pick up where we have finished today  Following are the goals we discussed today:  Patient Self Care Activities: Attend all scheduled provider appointments Call provider office for new concerns or questions  Take medications as prescribed   Great job not smoking since you have come home from the hospital-- Please continue NOT SMOKING- this is the most important thing you can do to improve your health and your breathing Continue pacing activity to avoid episodes of shortness of breath Continue using home oxygen  as prescribed- continue monitoring your oxygen  levels at home frequently Continue to follow your established action plan for episodes of shortness of breath- seek emergency care if you feel your condition is worsening and you are struggling to breathe Continue working with the home health team that is involved in your care once they make their first home visit If you believe your condition is getting worse- contact your care providers (doctors) promptly- reaching out to your doctor early when you have concerns can prevent you from having to go to the hospita  If you are experiencing a Mental Health or Behavioral Health Crisis or need someone to talk to, please  call the Suicide and Crisis Lifeline: 988 call the [place]  National Suicide Prevention Lifeline: (727) 825-5971 or TTY: 323 621 4726 TTY 380-256-8853) to talk to a trained counselor call 1-800-273-TALK (toll free, 24 hour hotline) go to [facility] [address] [place]  [phone]) call the [facility] Crisis Line: [phone] call 911   Patient verbalizes understanding of instructions and care plan provided today and agrees to view in MyChart. Active MyChart status and patient understanding of how to access instructions and care plan via MyChart confirmed with patient.     Epiphany Seltzer Mckinney Hina Gupta, RN, BSN, Media planner  Transitions of Care  [facility] - [facility] [phone]: direct office

## 2024-04-26 NOTE — Transitions of Care (Post Inpatient/ED Visit) (Signed)
 Transition of Care week # 6/ day # 33  Visit Note  04/26/2024  Name: [name] MRN: [id number]          DOB: 1956/06/07  Situation: Patient enrolled in [facility] 30-day program. Visit completed with patient by telephone.   HIPAA identifiers x 2 verified  Background:  Recent Hospitalizations- 2 within 30 days of each other- both for treatment of COPD: Acute on Chronic Respiratory Failure in setting of ongoing smoking Recent hospital admission May 26-28, 2025 for acute on chronic respiratory failure in setting of chronic home O2- patient declined hospice services at time of hospitalization/ hospital discharge Readmission less than 24-hours post 03/17/24 discharge:  May 29-March 23, 2024: Acute on Chronic Respiratory Failure/ end-stage chronic respiratory failure Fragile state of health, multiple progressing chronic health conditions- ongoing tobacco use- 03/24/24: has not smoked since 03/18/24 3 unplanned hospital admissions x last 6/ 12 months- all for acute on chronic respiratory failure  TOC 30--day outreach completed; patient has successfully met/ accomplished established goals for TOC 30-day program without hospital readmission and referral was sent for ongoing follow up with longitudinal RN CM   Initial Transition Care Management Follow-up Telephone Call    Past Medical History:  Diagnosis Date   Bronchitis    CAD (coronary artery disease)    a. 02/2002: CABG x3 with LIMA to LAD, RIMA to RCA, and SVG to OM   CHF (congestive heart failure) (HCC)    COPD (chronic obstructive pulmonary disease) (HCC)    Diabetes (HCC)    Headache    Heart disease    Hyperlipidemia    Hypertension    Hypertensive emergency 07/01/2013   Impaired glucose tolerance 08/20/2014   Sinusitis    Assessment:  I am still doing great, no problems at all; even able to get out and am running errands and driving myself.  Stll breathing great- better than I have in years; no problems at all, no flare ups of  shortness of breath at all.  Still not smoking; and all my home numbers look fine; going to the gym and home health PT is still coming out to work with me every week- they have 2 more weeks.  I am having no problems at all.    Denies clinical concerns and sounds to be in no distress throughout [facility] 30-day program outreach call today  Patient Reported Symptoms: Cognitive Cognitive Status: Normal speech and language skills, Insightful and able to interpret abstract concepts, Alert and oriented to person, place, and time      Neurological Neurological Review of Symptoms: No symptoms reported    HEENT HEENT Symptoms Reported: No symptoms reported      Cardiovascular Cardiovascular Symptoms Reported: No symptoms reported Other Cardiovascular Symptoms: reinforced rationale for daily weight monitoring at home along with weight gain guidelines/ action plan for weight gain; importance of taking diuretic as prescribed; reviewed recent daily weights at home: she continues to report ranges between 180-183 lbs; reports 183 lbs today; Denies other signs/ symptoms fluid retention Does patient have uncontrolled Hypertension?: No Cardiovascular Management Strategies: Medication therapy, Routine screening, Diet modification, Coping strategies, Fluid modification, Weight management, Adequate rest, Exercise Do You Have a Working Readable Scale?: Yes Weight: 183 lb (83 kg) (home reported weight from 04/26/24)  Respiratory Respiratory Symptoms Reported: No symptoms reported Other Respiratory Symptoms: Continues to deny shortness of breath/ cough; confirms still no longer smoking; confirms continues using home O2 as per baseline; sounds to be in no respiratory distress throughout [facility]  call today Respiratory Management Strategies: Adequate rest, Coping strategies, Exercise, Oxygen  therapy, Medication therapy, Fluid modification, Weight management, Routine screening  Endocrine Endocrine Symptoms Reported: No symptoms  reported Is patient diabetic?: Yes Is patient checking blood sugars at home?: Yes List most recent blood sugar readings, include date and time of day: Continues to report blood sugars fine, no changes; she declines detailed review of same today: she is not at home and is out driving    Gastrointestinal Gastrointestinal Symptoms Reported: No symptoms reported      Genitourinary Genitourinary Symptoms Reported: No symptoms reported    Integumentary Integumentary Symptoms Reported: No symptoms reported    Musculoskeletal Musculoskelatal Symptoms Reviewed: No symptoms reported        Psychosocial Psychosocial Symptoms Reported: No symptoms reported         There were no vitals filed for this visit.  Medications Reviewed Today     Reviewed by Falisha Osment M, RN (Registered Nurse) on 04/26/24 at 1028  Med List Status: <None>   Medication Order Taking? Sig Documenting Provider Last Dose Status Informant  acetaminophen  (TYLENOL ) 500 MG tablet [id number]  Take 1,000 mg by mouth 2 (two) times daily as needed for headache, fever, moderate pain or mild pain. [provider]  Active Pharmacy Records, Self  albuterol  (VENTOLIN  HFA) 108 (90 Base) MCG/ACT inhaler [id number]  INHALE 1-2 PUFFS BY MOUTH EVERY 6 HOURS AS NEEDED FOR WHEEZE OR SHORTNESS OF SHERIDA [name] Harden LULLA, MD  Active Pharmacy Records, Self  alendronate  (FOSAMAX ) 70 MG tablet [id number]  Take 70 mg by mouth once a week. [provider]  Active Pharmacy Records, Self  amLODipine  (NORVASC ) 5 MG tablet [id number]  TAKE 1 TABLET (5 MG TOTAL) BY MOUTH DAILY. Jeffrie Oneil BROCKS, MD  Active   atorvastatin  (LIPITOR) 40 MG tablet [id number]  TAKE 1 TABLET BY MOUTH EVERY DAY Norleen Lynwood ORN, MD  Active   bisoprolol  (ZEBETA ) 5 MG tablet [id number]  TAKE 1 TABLET (5 MG TOTAL) BY MOUTH DAILY. Jeffrie Oneil BROCKS, MD  Active Pharmacy Records, Self  Cholecalciferol  (VITAMIN D -3 PO) [id number]  Take 1 capsule by mouth in the morning. [provider]  Active Pharmacy Records, Self  Cyanocobalamin  (VITAMIN B-12 PO) [id number]  Take 1 tablet by mouth in the morning. [provider]  Active Pharmacy Records, Self  Dulaglutide  (TRULICITY ) 3 MG/0.5ML EMMANUEL [id number]  Inject 3 mg as directed once a week.  Patient taking differently: Inject 1.5 mg as directed once a week.   Norleen Lynwood ORN, MD  Active Pharmacy Records, Self  Dupilumab  (DUPIXENT ) 300 MG/2ML EMMANUEL [id number]  Inject 300 mg into the skin every 14 (fourteen) days.  Patient not taking: Reported on 04/05/2024   [name] Harden LULLA, MD  Active Pharmacy Records, Self           Med Note (Swain Acree M   Wed Mar 24, 2024 11:18 AM) 03/24/24:  Reports during TOC call, pulmonary provider instructed her to hold this medication indefinitely- she is not currently taking  fluticasone  (FLONASE ) 50 MCG/ACT nasal spray [id number]  Place 1 spray into both nostrils daily. Billy Asberry FALCON, PA-C  Active Pharmacy Records, Self  Fluticasone -Umeclidin-Vilant (TRELEGY ELLIPTA ) 200-62.5-25 MCG/ACT AEPB [id number]  INHALE 1 PUFF BY MOUTH EVERY DAY [name] Harden LULLA, MD  Active Pharmacy Records, Self  guaiFENesin -dextromethorphan  (ROBITUSSIN DM) 100-10 MG/5ML syrup [id number]  Take 5 mLs by mouth every 4 (four) hours as needed for cough. Pokhrel, Laxman, MD  Active   insulin  aspart (NOVOLOG   FLEXPEN) 100 UNIT/ML FlexPen [id number]  Three times daily for meals. CBG 70 - 120: 0 units CBG 121 - 150: 3 units CBG 151 - 200: 4 units CBG 201 - 250: 7 units CBG 251 - 300: 11 units CBG 301 - 350: 15 units CBG 351 - 400: 20 units Pokhrel, Laxman, MD  Expired 04/17/24 2359   Insulin  Pen Needle 31G X 8 MM MISC [id number]  Use as directed with insulin  pens  Patient not taking: Reported on 04/05/2024   Sonjia Held, MD  Active   ipratropium-albuterol  (DUONEB) 0.5-2.5 (3) MG/3ML SOLN [id number]  INHALE 3 ML BY NEBULIZER EVERY 6 HOURS AS NEEDED [name] Harden GAILS, MD  Active Pharmacy Records, Self  isosorbide  mononitrate (IMDUR )  60 MG 24 hr tablet [id number]  TAKE 1.5 TABLETS (90 MG TOTAL) BY MOUTH DAILY. Jeffrie Oneil BROCKS, MD  Active Pharmacy Records, Self  KLOR-CON  M10 10 MEQ tablet [id number]  TAKE 1 TABLET BY MOUTH EVERY DAY  Patient not taking: Reported on 04/05/2024   Norleen Lynwood ORN, MD  Active Pharmacy Records, Self  MAGNESIUM  OXIDE PO [id number]  Take 1 tablet by mouth at bedtime. [provider]  Active Pharmacy Records, Self  metFORMIN  (GLUCOPHAGE -XR) 500 MG 24 hr tablet [id number]  TAKE 2 TABLET BY MOUTH EVERY DAY WITH BREAKFAST John, James W, MD  Active Pharmacy Records, Self  Multiple Vitamins-Minerals (MULTIVITAMIN WOMEN 50+) TABS [id number]  Take 1 tablet by mouth in the morning. [provider]  Active Pharmacy Records, Self  OXYGEN  [id number]  Inhale 3 L into the lungs continuous. [provider]  Active Self  predniSONE  (DELTASONE ) 10 MG tablet [id number]  Take 6 tablets (60 mg) daily for 2 days, then, Take 5 tablets (50 mg) daily for 2 days, then, Take 4 tablets (40 mg) daily for 2 days, then, Take 3 tablets (30 mg) daily for 2 days, then Take 2 tablets (20mg ) daily for 2 days then start your home dose of once a day  Patient not taking: Reported on 04/05/2024   Pokhrel, Laxman, MD  Active   predniSONE  (DELTASONE ) 10 MG tablet [id number]  Take 1 tablet (10 mg total) by mouth daily with breakfast. After completing taper Pokhrel, Laxman, MD  Active   torsemide  (DEMADEX ) 20 MG tablet [id number]  Take 1 tablet (20 mg total) by mouth daily. Norleen Lynwood ORN, MD  Active             Recommendation:   Specialty provider follow-up pulmonary provider 05/18/24 Continue Current Plan of Care  Follow Up Plan:   Closing From:  Transitions of Care Program- referral was sent for ongoing follow up with longitudinal RN CM   Pls call/ message for questions,  Beatris Blinda Lawrence, RN, BSN, CCRN Alumnus RN Care Manager  Transitions of Care  [facility] - [facility] [phone]: direct  office

## 2024-04-27 ENCOUNTER — Telehealth: Payer: Self-pay | Admitting: *Deleted

## 2024-04-27 NOTE — Progress Notes (Signed)
 Complex Care Management Note  Care Guide Note 04/27/2024 Name: [name] MRN: [id number] DOB: 02-11-1956  [name] is a 68 y.o. year old female who sees Norleen Lynwood ORN, MD for primary care. I reached out to Kyra CHRISTELLA Finder by phone today to offer complex care management services.  [name] was given information about Complex Care Management services today including:   The Complex Care Management services include support from the care team which includes your Nurse Care Manager, Clinical Social Worker, or Pharmacist.  The Complex Care Management team is here to help remove barriers to the health concerns and goals most important to you. Complex Care Management services are voluntary, and the patient may decline or stop services at any time by request to their care team member.   Complex Care Management Consent Status: Patient agreed to services and verbal consent obtained.   Follow up plan:  Telephone appointment with complex care management team member scheduled for:  05/03/2024  Encounter Outcome:  Patient Scheduled  Thedford Franks, CMA [place]  [facility], [facility] Guide Direct Dial: [phone]  Fax: [phone] Website: [email or web address]

## 2024-04-28 DIAGNOSIS — J9622 Acute and chronic respiratory failure with hypercapnia: Secondary | ICD-10-CM | POA: Diagnosis not present

## 2024-04-28 DIAGNOSIS — E785 Hyperlipidemia, unspecified: Secondary | ICD-10-CM | POA: Diagnosis not present

## 2024-04-28 DIAGNOSIS — E1165 Type 2 diabetes mellitus with hyperglycemia: Secondary | ICD-10-CM | POA: Diagnosis not present

## 2024-04-28 DIAGNOSIS — I700 Atherosclerosis of aorta: Secondary | ICD-10-CM | POA: Diagnosis not present

## 2024-04-28 DIAGNOSIS — E873 Alkalosis: Secondary | ICD-10-CM | POA: Diagnosis not present

## 2024-04-28 DIAGNOSIS — A5901 Trichomonal vulvovaginitis: Secondary | ICD-10-CM | POA: Diagnosis not present

## 2024-04-28 DIAGNOSIS — G4733 Obstructive sleep apnea (adult) (pediatric): Secondary | ICD-10-CM | POA: Diagnosis not present

## 2024-04-28 DIAGNOSIS — G4734 Idiopathic sleep related nonobstructive alveolar hypoventilation: Secondary | ICD-10-CM | POA: Diagnosis not present

## 2024-04-28 DIAGNOSIS — M5416 Radiculopathy, lumbar region: Secondary | ICD-10-CM | POA: Diagnosis not present

## 2024-04-28 DIAGNOSIS — J9621 Acute and chronic respiratory failure with hypoxia: Secondary | ICD-10-CM | POA: Diagnosis not present

## 2024-04-28 DIAGNOSIS — I5033 Acute on chronic diastolic (congestive) heart failure: Secondary | ICD-10-CM | POA: Diagnosis not present

## 2024-04-28 DIAGNOSIS — K760 Fatty (change of) liver, not elsewhere classified: Secondary | ICD-10-CM | POA: Diagnosis not present

## 2024-04-28 DIAGNOSIS — D751 Secondary polycythemia: Secondary | ICD-10-CM | POA: Diagnosis not present

## 2024-04-28 DIAGNOSIS — E559 Vitamin D deficiency, unspecified: Secondary | ICD-10-CM | POA: Diagnosis not present

## 2024-04-28 DIAGNOSIS — J309 Allergic rhinitis, unspecified: Secondary | ICD-10-CM | POA: Diagnosis not present

## 2024-04-28 DIAGNOSIS — J441 Chronic obstructive pulmonary disease with (acute) exacerbation: Secondary | ICD-10-CM | POA: Diagnosis not present

## 2024-04-28 DIAGNOSIS — L299 Pruritus, unspecified: Secondary | ICD-10-CM | POA: Diagnosis not present

## 2024-04-28 DIAGNOSIS — J398 Other specified diseases of upper respiratory tract: Secondary | ICD-10-CM | POA: Diagnosis not present

## 2024-04-28 DIAGNOSIS — G4719 Other hypersomnia: Secondary | ICD-10-CM | POA: Diagnosis not present

## 2024-04-28 DIAGNOSIS — G4700 Insomnia, unspecified: Secondary | ICD-10-CM | POA: Diagnosis not present

## 2024-04-28 DIAGNOSIS — I110 Hypertensive heart disease with heart failure: Secondary | ICD-10-CM | POA: Diagnosis not present

## 2024-04-28 DIAGNOSIS — I051 Rheumatic mitral insufficiency: Secondary | ICD-10-CM | POA: Diagnosis not present

## 2024-04-28 DIAGNOSIS — J329 Chronic sinusitis, unspecified: Secondary | ICD-10-CM | POA: Diagnosis not present

## 2024-04-28 DIAGNOSIS — E872 Acidosis, unspecified: Secondary | ICD-10-CM | POA: Diagnosis not present

## 2024-04-28 DIAGNOSIS — I2510 Atherosclerotic heart disease of native coronary artery without angina pectoris: Secondary | ICD-10-CM | POA: Diagnosis not present

## 2024-04-30 ENCOUNTER — Encounter: Payer: Self-pay | Admitting: Internal Medicine

## 2024-05-03 ENCOUNTER — Other Ambulatory Visit: Payer: Self-pay

## 2024-05-03 NOTE — Patient Instructions (Signed)
 Visit Information  Thank you for taking time to visit with me today. Please don't hesitate to contact me if I can be of assistance to you before our next scheduled appointment.  Our next appointment is by telephone on 05/18/24 at 11:00 am Please call the care guide team at [phone] if you need to cancel or reschedule your appointment.   Following is a copy of your care plan:   Goals Addressed             This Visit's Progress    VBCI RN Care Plan       Problems:  Chronic Disease Management support and education needs related to COPD  Goal: Over the next 90 days the Patient will attend all scheduled medical appointments: with providers as evidenced by patient report and/or review of chart        continue to work with RN Care Manager and/or Social Worker to address care management and care coordination needs related to COPD as evidenced by adherence to care management team scheduled appointments     demonstrate Ongoing adherence to prescribed treatment plan for COPD as evidenced by patient report and/or review of chart verbalize understanding of plan for management of COPD as evidenced by patient reports and/or review of chart  Interventions:   COPD Interventions: Advised patient to engage in light exercise as tolerated 3-5 days a week to aid in the the management of COPD Assessed social determinant of health barriers Discussed the importance of adequate rest and management of fatigue with COPD Screening for signs and symptoms of depression related to chronic disease state  Use of home oxygen  Offered to provide educational material regarding patient's health condition. Patient declined  Patient Self-Care Activities:  Attend all scheduled provider appointments Call provider office for new concerns or questions  Take medications as prescribed   Continue smoking cessation progress  Plan:  Telephone follow up appointment with care management team member scheduled for:  05/18/24  at 11:00 am           Please call the Suicide and Crisis Lifeline: 988 call the [place]  National Suicide Prevention Lifeline: (606)836-0700 or TTY: 760-745-4921 TTY 548-048-6139) to talk to a trained counselor if you are experiencing a Mental Health or Behavioral Health Crisis or need someone to talk to.  Patient verbalizes understanding of instructions and care plan provided today and agrees to view in MyChart. Active MyChart status and patient understanding of how to access instructions and care plan via MyChart confirmed with patient.     Heddy Shutter, RN, MSN, BSN, CCM [place]  [facility], Population Health Case Manager Phone: [phone]

## 2024-05-03 NOTE — Patient Outreach (Signed)
 Complex Care Management   Visit Note  05/04/2024  Name:  [name] MRN: [id number] DOB: 1956-07-17  Situation: Referral received for Complex Care Management related to COPD I obtained verbal consent from Patient.  Visit completed with patient  on the phone  Background:   Past Medical History:  Diagnosis Date   Bronchitis    CAD (coronary artery disease)    a. 02/2002: CABG x3 with LIMA to LAD, RIMA to RCA, and SVG to OM   CHF (congestive heart failure) (HCC)    COPD (chronic obstructive pulmonary disease) (HCC)    Diabetes (HCC)    Headache    Heart disease    Hyperlipidemia    Hypertension    Hypertensive emergency 07/01/2013   Impaired glucose tolerance 08/20/2014   Sinusitis     Assessment: Patient reports, I am doing good. Reports weight 185 lbs this morning. She states she know she ate too much of the wrong kind of food on yesterday. Reported weight on 04/26/24 was 183. States she took 1.5 tablets of her torsemide . She denies any SOB, swelling or edema.  Patient Reported Symptoms:  Cognitive Cognitive Status: Insightful and able to interpret abstract concepts, Alert and oriented to person, place, and time, Normal speech and language skills      Neurological Neurological Review of Symptoms: No symptoms reported    HEENT HEENT Symptoms Reported: No symptoms reported      Cardiovascular Other Cardiovascular Symptoms: reports continues to weigh self daily weight today was 185lb and reports took 1.5 of torsemide  this morning. weight was 182lb. Does patient have uncontrolled Hypertension?: No Cardiovascular Management Strategies: Coping strategies, Activity, Adequate rest, Exercise, Medication therapy, Routine screening, Weight management Do You Have a Working Readable Scale?: Yes Cardiovascular Self-Management Outcome: 4 (good)  Respiratory Respiratory Symptoms Reported: No symptoms reported Other Respiratory Symptoms: uses treadmill or walking around [place] SOB.  denies wheezing Additional Respiratory Details: O2 at 3L/[place] when last checked was 94% on 05/02/24 Respiratory Management Strategies: Adequate rest, Activity, BiPAP, Coping strategies, Exercise, Oxygen  therapy, Routine screening, Weight management Respiratory Self-Management Outcome: 5 (very good)  Endocrine Endocrine Symptoms Reported: No symptoms reported Is patient diabetic?: Yes Is patient checking blood sugars at home?: Yes List most recent blood sugar readings, include date and time of day: BS 122 today fasting this morning. reports after lunch may get 140 Endocrine Self-Management Outcome: 5 (very good)  Gastrointestinal Gastrointestinal Symptoms Reported: No symptoms reported Other Gastrointestinal Symptoms: reports Miralx helps with constipation. denies constipation at this time      Genitourinary Genitourinary Symptoms Reported: No symptoms reported    Integumentary Integumentary Symptoms Reported: No symptoms reported    Musculoskeletal Musculoskelatal Symptoms Reviewed: No symptoms reported        Psychosocial Psychosocial Symptoms Reported: No symptoms reported     Quality of Family Relationships: supportive Do you feel physically threatened by others?: No      04/20/2024   11:10 AM  Depression screen PHQ 2/9  Decreased Interest 0  Down, Depressed, Hopeless 0  PHQ - 2 Score 0    There were no vitals filed for this visit.  Medications Reviewed Today     Reviewed by Brandonlee Navis M, RN (Registered Nurse) on 05/03/24 at 1019  Med List Status: <None>   Medication Order Taking? Sig Documenting Provider Last Dose Status Informant  acetaminophen  (TYLENOL ) 500 MG tablet [id number] Yes Take 1,000 mg by mouth 2 (two) times daily as needed for headache, fever, moderate pain or mild  pain. [provider]  Active Pharmacy Records, Self  albuterol  (VENTOLIN  HFA) 108 (90 Base) MCG/ACT inhaler [id number] Yes INHALE 1-2 PUFFS BY MOUTH EVERY 6 HOURS AS NEEDED FOR  WHEEZE OR SHORTNESS OF SHERIDA [name] Harden LULLA, MD  Active Pharmacy Records, Self  alendronate  (FOSAMAX ) 70 MG tablet [id number] Yes Take 70 mg by mouth once a week. [provider]  Active Pharmacy Records, Self  amLODipine  (NORVASC ) 5 MG tablet [id number] Yes TAKE 1 TABLET (5 MG TOTAL) BY MOUTH DAILY. Jeffrie Oneil BROCKS, MD  Active   atorvastatin  (LIPITOR) 40 MG tablet [id number] Yes TAKE 1 TABLET BY MOUTH EVERY DAY Norleen Lynwood ORN, MD  Active   bisoprolol  (ZEBETA ) 5 MG tablet [id number] Yes TAKE 1 TABLET (5 MG TOTAL) BY MOUTH DAILY. Jeffrie Oneil BROCKS, MD  Active Pharmacy Records, Self  Cholecalciferol  (VITAMIN D -3 PO) [id number] Yes Take 1 capsule by mouth in the morning. [provider]  Active Pharmacy Records, Self  Cyanocobalamin  (VITAMIN B-12 PO) [id number] Yes Take 1 tablet by mouth in the morning. [provider]  Active Pharmacy Records, Self  Dulaglutide  (TRULICITY ) 3 MG/0.5ML SOAJ [id number] Yes Inject 3 mg as directed once a week. Norleen Lynwood ORN, MD  Active Pharmacy Records, Self  Dupilumab  (DUPIXENT ) 300 MG/2ML EMMANUEL [id number]  Inject 300 mg into the skin every 14 (fourteen) days.  Patient not taking: Reported on 05/03/2024   [name] Harden LULLA, MD  Active Pharmacy Records, Self           Med Note (TOUSEY, LAINE M   Wed Mar 24, 2024 11:18 AM) 03/24/24:  Reports during TOC call, pulmonary provider instructed her to hold this medication indefinitely- she is not currently taking  fluticasone  (FLONASE ) 50 MCG/ACT nasal spray [id number] Yes Place 1 spray into both nostrils daily. Billy Asberry JULIANNA DEVONNA  Active Pharmacy Records, Self  Fluticasone -Umeclidin-Vilant (TRELEGY ELLIPTA ) 200-62.5-25 MCG/ACT AEPB [id number] Yes INHALE 1 PUFF BY MOUTH EVERY DAY [name] Harden LULLA, MD  Active Pharmacy Records, Self  guaiFENesin -dextromethorphan  (ROBITUSSIN DM) 100-10 MG/5ML syrup [id number] Yes Take 5 mLs by mouth every 4 (four) hours as needed for cough. Pokhrel, Laxman, MD  Active   insulin  aspart (NOVOLOG   FLEXPEN) 100 UNIT/ML FlexPen [id number]  Three times daily for meals. CBG 70 - 120: 0 units CBG 121 - 150: 3 units CBG 151 - 200: 4 units CBG 201 - 250: 7 units CBG 251 - 300: 11 units CBG 301 - 350: 15 units CBG 351 - 400: 20 units  Patient not taking: Reported on 05/03/2024   Sonjia Held, MD  Expired 04/17/24 2359   Insulin  Pen Needle 31G X 8 MM MISC [id number]  Use as directed with insulin  pens  Patient not taking: Reported on 04/05/2024   Sonjia Held, MD  Active   ipratropium-albuterol  (DUONEB) 0.5-2.5 (3) MG/3ML SOLN [id number] Yes INHALE 3 ML BY NEBULIZER EVERY 6 HOURS AS NEEDED [name] Harden LULLA, MD  Active Pharmacy Records, Self  isosorbide  mononitrate (IMDUR ) 60 MG 24 hr tablet [id number] Yes TAKE 1.5 TABLETS (90 MG TOTAL) BY MOUTH DAILY. Jeffrie Oneil BROCKS, MD  Active Pharmacy Records, Self  KLOR-CON  M10 10 MEQ tablet [id number] Yes TAKE 1 TABLET BY MOUTH EVERY DAY Norleen Lynwood ORN, MD  Active Pharmacy Records, Self  MAGNESIUM  OXIDE PO [id number]  Take 1 tablet by mouth at bedtime.  Patient not taking: Reported on 05/03/2024   [provider]  Active Pharmacy Records, Self  metFORMIN  (GLUCOPHAGE -XR) 500 MG 24 hr tablet [id number] Yes TAKE 2  TABLET BY MOUTH EVERY DAY WITH BREAKFAST Norleen Lynwood ORN, MD  Active Pharmacy Records, Self  Multiple Vitamins-Minerals (MULTIVITAMIN WOMEN 50+) TABS [id number] Yes Take 1 tablet by mouth in the morning. [provider]  Active Pharmacy Records, Self  OXYGEN  [id number] Yes Inhale 3 L into the lungs continuous. [provider]  Active Self  predniSONE  (DELTASONE ) 10 MG tablet [id number]  Take 6 tablets (60 mg) daily for 2 days, then, Take 5 tablets (50 mg) daily for 2 days, then, Take 4 tablets (40 mg) daily for 2 days, then, Take 3 tablets (30 mg) daily for 2 days, then Take 2 tablets (20mg ) daily for 2 days then start your home dose of once a day  Patient not taking: Reported on 04/05/2024   Pokhrel, Laxman, MD  Active   predniSONE   (DELTASONE ) 10 MG tablet [id number] Yes Take 1 tablet (10 mg total) by mouth daily with breakfast. After completing taper Pokhrel, Laxman, MD  Active   torsemide  (DEMADEX ) 20 MG tablet [id number] Yes Take 1 tablet (20 mg total) by mouth daily. Norleen Lynwood ORN, MD  Active           Recommendation:   Patient to contact provider with increase weight gain or any worsening signs/symptoms of HF Patient to continue to monitor for COPD exacerbation. Pulmonary provider 05/18/24  Follow Up Plan:   Telephone follow up appointment date/time:  05/18/24 at 11:00 am  Heddy Shutter, RN, MSN, BSN, CCM [place]  [facility], Population Health Case Manager Phone: [phone]

## 2024-05-05 DIAGNOSIS — D751 Secondary polycythemia: Secondary | ICD-10-CM | POA: Diagnosis not present

## 2024-05-05 DIAGNOSIS — I5033 Acute on chronic diastolic (congestive) heart failure: Secondary | ICD-10-CM | POA: Diagnosis not present

## 2024-05-05 DIAGNOSIS — G4719 Other hypersomnia: Secondary | ICD-10-CM | POA: Diagnosis not present

## 2024-05-05 DIAGNOSIS — J9622 Acute and chronic respiratory failure with hypercapnia: Secondary | ICD-10-CM | POA: Diagnosis not present

## 2024-05-05 DIAGNOSIS — I700 Atherosclerosis of aorta: Secondary | ICD-10-CM | POA: Diagnosis not present

## 2024-05-05 DIAGNOSIS — J398 Other specified diseases of upper respiratory tract: Secondary | ICD-10-CM | POA: Diagnosis not present

## 2024-05-05 DIAGNOSIS — M5416 Radiculopathy, lumbar region: Secondary | ICD-10-CM | POA: Diagnosis not present

## 2024-05-05 DIAGNOSIS — I051 Rheumatic mitral insufficiency: Secondary | ICD-10-CM | POA: Diagnosis not present

## 2024-05-05 DIAGNOSIS — E873 Alkalosis: Secondary | ICD-10-CM | POA: Diagnosis not present

## 2024-05-05 DIAGNOSIS — E559 Vitamin D deficiency, unspecified: Secondary | ICD-10-CM | POA: Diagnosis not present

## 2024-05-05 DIAGNOSIS — L299 Pruritus, unspecified: Secondary | ICD-10-CM | POA: Diagnosis not present

## 2024-05-05 DIAGNOSIS — J9621 Acute and chronic respiratory failure with hypoxia: Secondary | ICD-10-CM | POA: Diagnosis not present

## 2024-05-05 DIAGNOSIS — E785 Hyperlipidemia, unspecified: Secondary | ICD-10-CM | POA: Diagnosis not present

## 2024-05-05 DIAGNOSIS — J309 Allergic rhinitis, unspecified: Secondary | ICD-10-CM | POA: Diagnosis not present

## 2024-05-05 DIAGNOSIS — K760 Fatty (change of) liver, not elsewhere classified: Secondary | ICD-10-CM | POA: Diagnosis not present

## 2024-05-05 DIAGNOSIS — I110 Hypertensive heart disease with heart failure: Secondary | ICD-10-CM | POA: Diagnosis not present

## 2024-05-05 DIAGNOSIS — E872 Acidosis, unspecified: Secondary | ICD-10-CM | POA: Diagnosis not present

## 2024-05-05 DIAGNOSIS — J329 Chronic sinusitis, unspecified: Secondary | ICD-10-CM | POA: Diagnosis not present

## 2024-05-05 DIAGNOSIS — A5901 Trichomonal vulvovaginitis: Secondary | ICD-10-CM | POA: Diagnosis not present

## 2024-05-05 DIAGNOSIS — J441 Chronic obstructive pulmonary disease with (acute) exacerbation: Secondary | ICD-10-CM | POA: Diagnosis not present

## 2024-05-05 DIAGNOSIS — I2510 Atherosclerotic heart disease of native coronary artery without angina pectoris: Secondary | ICD-10-CM | POA: Diagnosis not present

## 2024-05-05 DIAGNOSIS — E1165 Type 2 diabetes mellitus with hyperglycemia: Secondary | ICD-10-CM | POA: Diagnosis not present

## 2024-05-05 DIAGNOSIS — G4733 Obstructive sleep apnea (adult) (pediatric): Secondary | ICD-10-CM | POA: Diagnosis not present

## 2024-05-05 DIAGNOSIS — G4700 Insomnia, unspecified: Secondary | ICD-10-CM | POA: Diagnosis not present

## 2024-05-05 DIAGNOSIS — G4734 Idiopathic sleep related nonobstructive alveolar hypoventilation: Secondary | ICD-10-CM | POA: Diagnosis not present

## 2024-05-06 ENCOUNTER — Encounter: Payer: Self-pay | Admitting: Internal Medicine

## 2024-05-07 ENCOUNTER — Encounter: Payer: Self-pay | Admitting: Internal Medicine

## 2024-05-07 MED ORDER — PREDNISONE 10 MG PO TABS: 10.0000 mg | ORAL_TABLET | Freq: Every day | ORAL | 1 refills | Status: DC

## 2024-05-12 ENCOUNTER — Other Ambulatory Visit: Payer: Self-pay | Admitting: Pulmonary Disease

## 2024-05-18 ENCOUNTER — Encounter (HOSPITAL_BASED_OUTPATIENT_CLINIC_OR_DEPARTMENT_OTHER): Payer: Self-pay | Admitting: Primary Care

## 2024-05-18 ENCOUNTER — Ambulatory Visit (HOSPITAL_BASED_OUTPATIENT_CLINIC_OR_DEPARTMENT_OTHER): Admitting: Primary Care

## 2024-05-18 ENCOUNTER — Other Ambulatory Visit: Payer: Self-pay

## 2024-05-18 VITALS — BP 109/68 | HR 94 | Ht 61.0 in | Wt 185.0 lb

## 2024-05-18 DIAGNOSIS — Z87891 Personal history of nicotine dependence: Secondary | ICD-10-CM | POA: Diagnosis not present

## 2024-05-18 DIAGNOSIS — J9610 Chronic respiratory failure, unspecified whether with hypoxia or hypercapnia: Secondary | ICD-10-CM | POA: Diagnosis not present

## 2024-05-18 DIAGNOSIS — J4489 Other specified chronic obstructive pulmonary disease: Secondary | ICD-10-CM | POA: Diagnosis not present

## 2024-05-18 DIAGNOSIS — J9611 Chronic respiratory failure with hypoxia: Secondary | ICD-10-CM | POA: Diagnosis not present

## 2024-05-18 DIAGNOSIS — J9612 Chronic respiratory failure with hypercapnia: Secondary | ICD-10-CM | POA: Diagnosis not present

## 2024-05-18 DIAGNOSIS — J449 Chronic obstructive pulmonary disease, unspecified: Secondary | ICD-10-CM | POA: Diagnosis not present

## 2024-05-18 NOTE — Patient Outreach (Signed)
 Complex Care Management   Visit Note  05/18/2024  Name:  [name] MRN: [id number] DOB: 05-17-1956  Situation: Referral received for Complex Care Management related to Heart Failure and COPD I obtained verbal consent from Patient.  Visit completed with patient  on the phone  Background:   Past Medical History:  Diagnosis Date   Bronchitis    CAD (coronary artery disease)    a. 02/2002: CABG x3 with LIMA to LAD, RIMA to RCA, and SVG to OM   CHF (congestive heart failure) (HCC)    COPD (chronic obstructive pulmonary disease) (HCC)    Diabetes (HCC)    Headache    Heart disease    Hyperlipidemia    Hypertension    Hypertensive emergency 07/01/2013   Impaired glucose tolerance 08/20/2014   Sinusitis     Assessment: Patient Reported Symptoms:  Cognitive Cognitive Status: Insightful and able to interpret abstract concepts, No symptoms reported, Normal speech and language skills      Neurological Neurological Review of Symptoms: No symptoms reported    HEENT HEENT Symptoms Reported:  (reports throat is a little scrathy, discussed using warm salt water gargling. confirmed she is rinsing after trelegy, pulmonary medications.  advised to notify provider at pulmonology appointment.)      Cardiovascular Cardiovascular Symptoms Reported: No symptoms reported Other Cardiovascular Symptoms: weight today 183lb and weight yesterday(05/17/24) 184lb Does patient have uncontrolled Hypertension?: No Weight: 183 lb (83 kg) (paitent home reading)  Respiratory Respiratory Symptoms Reported: No symptoms reported Additional Respiratory Details: continues to use O2 at 3L/[place]. stated goes to the gym every day and went this morning    Endocrine Endocrine Symptoms Reported: No symptoms reported Is patient diabetic?: Yes Is patient checking blood sugars at home?: Yes List most recent blood sugar readings, include date and time of day: BS this morning was 113 at 6:50am. Patient reports her suger  runs abot 115, reports has not been over 145-150.    Gastrointestinal Gastrointestinal Symptoms Reported: Constipation Other Gastrointestinal Symptoms: last bowel movement about 2 days ago. does not like the way miralax  works. Patient reports going to try to take Prune juice.      Genitourinary Genitourinary Symptoms Reported: No symptoms reported    Integumentary Integumentary Symptoms Reported: No symptoms reported Additional Integumentary Details: per review of chart: patient reported bruise to bottom of right foot to pcp on 05/07/24. However, patient denies any bruising to feet at this time. states she changed her work out shoes and bruise is gone.    Musculoskeletal Musculoskelatal Symptoms Reviewed: No symptoms reported        Psychosocial Psychosocial Symptoms Reported: No symptoms reported            04/20/2024   11:10 AM  Depression screen PHQ 2/9  Decreased Interest 0  Down, Depressed, Hopeless 0  PHQ - 2 Score 0    Vitals:   05/18/24 1117  BP: 125/70  SpO2: (!) 3%    Medications Reviewed Today     Reviewed by Karly Pitter M, RN (Registered Nurse) on 05/18/24 at 1114  Med List Status: <None>   Medication Order Taking? Sig Documenting Provider Last Dose Status Informant  acetaminophen  (TYLENOL ) 500 MG tablet [id number] Yes Take 1,000 mg by mouth 2 (two) times daily as needed for headache, fever, moderate pain or mild pain. [provider]  Active Pharmacy Records, Self  albuterol  (VENTOLIN  HFA) 108 (90 Base) MCG/ACT inhaler [id number] Yes INHALE 1-2 PUFFS BY MOUTH EVERY 6 HOURS AS NEEDED  FOR WHEEZE OR SHORTNESS OF SHERIDA [name] Harden LULLA, MD  Active   alendronate  (FOSAMAX ) 70 MG tablet [id number] Yes Take 70 mg by mouth once a week. [provider]  Active Pharmacy Records, Self  amLODipine  (NORVASC ) 5 MG tablet [id number] Yes TAKE 1 TABLET (5 MG TOTAL) BY MOUTH DAILY. Jeffrie Oneil BROCKS, MD  Active   atorvastatin  (LIPITOR) 40 MG tablet [id number] Yes TAKE 1  TABLET BY MOUTH EVERY DAY Norleen Lynwood ORN, MD  Active   bisoprolol  (ZEBETA ) 5 MG tablet [id number] Yes TAKE 1 TABLET (5 MG TOTAL) BY MOUTH DAILY. Jeffrie Oneil BROCKS, MD  Active Pharmacy Records, Self  Cholecalciferol  (VITAMIN D -3 PO) [id number] Yes Take 1 capsule by mouth in the morning. [provider]  Active Pharmacy Records, Self  Cyanocobalamin  (VITAMIN B-12 PO) [id number] Yes Take 1 tablet by mouth in the morning. [provider]  Active Pharmacy Records, Self  Dulaglutide  (TRULICITY ) 3 MG/0.5ML EMMANUEL [id number] Yes Inject 3 mg as directed once a week. Norleen Lynwood ORN, MD  Active Pharmacy Records, Self  Dupilumab  (DUPIXENT ) 300 MG/2ML EMMANUEL [id number]  Inject 300 mg into the skin every 14 (fourteen) days.  Patient not taking: Reported on 05/03/2024   [name] Harden LULLA, MD  Active Pharmacy Records, Self           Med Note (TOUSEY, LAINE M   Wed Mar 24, 2024 11:18 AM) 03/24/24:  Reports during TOC call, pulmonary provider instructed her to hold this medication indefinitely- she is not currently taking  fluticasone  (FLONASE ) 50 MCG/ACT nasal spray [id number] Yes Place 1 spray into both nostrils daily.  Patient taking differently: Place 1 spray into both nostrils daily.   Billy Asberry JULIANNA DEVONNA  Active Pharmacy Records, Self  Fluticasone -Umeclidin-Vilant (TRELEGY ELLIPTA ) 200-62.5-25 MCG/ACT AEPB [id number] Yes INHALE 1 PUFF BY MOUTH EVERY DAY [name] Harden LULLA, MD  Active Pharmacy Records, Self  guaiFENesin -dextromethorphan  (ROBITUSSIN DM) 100-10 MG/5ML syrup [id number] Yes Take 5 mLs by mouth every 4 (four) hours as needed for cough. Pokhrel, Laxman, MD  Active   insulin  aspart (NOVOLOG  FLEXPEN) 100 UNIT/ML FlexPen [id number]  Three times daily for meals. CBG 70 - 120: 0 units CBG 121 - 150: 3 units CBG 151 - 200: 4 units CBG 201 - 250: 7 units CBG 251 - 300: 11 units CBG 301 - 350: 15 units CBG 351 - 400: 20 units  Patient not taking: Reported on 05/03/2024   Sonjia Held, MD  Expired 04/17/24 2359    Insulin  Pen Needle 31G X 8 MM MISC [id number]  Use as directed with insulin  pens  Patient not taking: Reported on 04/05/2024   Sonjia Held, MD  Active   ipratropium-albuterol  (DUONEB) 0.5-2.5 (3) MG/3ML SOLN [id number] Yes INHALE 3 ML BY NEBULIZER EVERY 6 HOURS AS NEEDED [name] Harden LULLA, MD  Active Pharmacy Records, Self  isosorbide  mononitrate (IMDUR ) 60 MG 24 hr tablet [id number] Yes TAKE 1.5 TABLETS (90 MG TOTAL) BY MOUTH DAILY. Jeffrie Oneil BROCKS, MD  Active Pharmacy Records, Self  KLOR-CON  M10 10 MEQ tablet [id number] Yes TAKE 1 TABLET BY MOUTH EVERY DAY Norleen Lynwood ORN, MD  Active Pharmacy Records, Self  MAGNESIUM  OXIDE PO [id number] Yes Take 1 tablet by mouth at bedtime. [provider]  Active Pharmacy Records, Self  metFORMIN  (GLUCOPHAGE -XR) 500 MG 24 hr tablet [id number] Yes TAKE 2 TABLET BY MOUTH EVERY DAY WITH BREAKFAST John, James W, MD  Active Pharmacy Records, Self  Multiple Vitamins-Minerals (MULTIVITAMIN WOMEN 50+) TABS [id number] Yes Take 1 tablet  by mouth in the morning. [provider]  Active Pharmacy Records, Self  OXYGEN  [id number] Yes Inhale 3 L into the lungs continuous. [provider]  Active Self  predniSONE  (DELTASONE ) 10 MG tablet [id number]  Take 6 tablets (60 mg) daily for 2 days, then, Take 5 tablets (50 mg) daily for 2 days, then, Take 4 tablets (40 mg) daily for 2 days, then, Take 3 tablets (30 mg) daily for 2 days, then Take 2 tablets (20mg ) daily for 2 days then start your home dose of once a day  Patient not taking: Reported on 04/05/2024   Pokhrel, Laxman, MD  Active   predniSONE  (DELTASONE ) 10 MG tablet [id number] Yes Take 1 tablet (10 mg total) by mouth daily with breakfast. After completing taper John, James W, MD  Active   torsemide  (DEMADEX ) 20 MG tablet [id number] Yes Take 1 tablet (20 mg total) by mouth daily. Norleen Lynwood ORN, MD  Active           Recommendation:   Specialty provider follow-up as scheduled with pulmonologist  today Continue Current Plan of Care  Follow Up Plan:   Telephone follow up appointment date/time:  06/18/24 at 11:00 am  Heddy Shutter, RN, MSN, BSN, CCM [place]  [facility], Population Health Case Manager Phone: [phone]

## 2024-05-18 NOTE — Addendum Note (Signed)
 Addended by: CLAUDENE NEVINS A on: 05/18/2024 03:15 PM   Modules accepted: Orders

## 2024-05-18 NOTE — Progress Notes (Signed)
 @Patient  ID: [name], female    DOB: [birth year], 68 y.o.   MRN: [id number]  No chief complaint on file.   Referring provider: Norleen Fleming ORN, MD  HPI: 68 yo smoker for FU of COPD, quit 11/2017 and mild tracheomalacia with chronic hypoxic resp failure on o2 since 2018   Recurrent exacerbations in 2022, requiring maintenance prednisone  starting 06/2021 -hospitalized 1/18 for 2 days for COPD exacerbation treated with BiPAP, steroids and bronchodilators    PMH - CABG HFpEF  6-week follow-up visit. Her back spasms subsided with meloxicam  and Flexeril .  She is off these medications now. She continues to report increased sleepiness in the daytime.  She is compliant with NIV and uses this all through the night.  She takes several naps in the daytime.  She does not get out much. She remains on 10 mg of prednisone .  She is planning a trip to the beach and does not want to attempt to taper  05/15/2023 Patient presents today for acute visit. Patient developed increased shortness of breath symptoms last Friday July 19th and Saturday July 20th.  She reported having difficulty taking full deep breath.  Associated wheezing and chest tightness. She is steroid dependent. She took 40mg  Prednisone  for 2 days and symptoms improved. She is compliant with Trelegy 200 mcg daily.  She has been using her nebulizer twice daily. She has been unable to taper prednisone  dose, she ran out of prescription.  Refill has been sent by Dr. [name]. No sick contacts or fever.   Trilogy download shows 100% compliance from June 23-July 23 Average usage 8.7 hours  Avg PIP 16.1 cm h20/ Avg PEEP 4.0cm h20  Avg min ventilation 8.4L/min Avg breaths per minute 23 bpm   05/18/2024- Interim hx Discussed the use of AI scribe software for clinical note transcription with the patient, who gave verbal consent to proceed.  History of Present Illness   [name] is a 68 year old female with COPD and chronic respiratory  failure who presents for follow-up after hospitalization. She is accompanied by her sister.  She has a history of COPD and chronic respiratory failure, with recent hospitalizations in February and June 2025. She has not been admitted since June. Her current medication regimen includes prednisone  10 mg daily, Trelegy 200mcg and duonebs. She was previously on Dupixent  for four months but discontinued it due to lack of improvement and experiencing two exacerbations during its use. She feels 'pretty good' on her current regimen, with improved breathing. No significant cough. She uses a noninvasive ventilator at night and has a scratchy throat, which she attributes to mouth breathing while using the ventilator.  She experiences constipation, having not had a bowel movement in two days. She uses Miralax , which sometimes causes involuntary bowel movements when sneezing or coughing. She has not tried Colace or Senokot. She was taking a large dose of Miralax  and is considering reducing it to half the recommended dose.  She is taking torsemide  20 mg daily for fluid retention and reports gaining three pounds overnight last week, which was managed by taking an extra dose. Her weight is up by six pounds since June. Awaiting to hear back from cardiology regarding an appointment.      Significant tests/ events reviewed   CT angiogram chest 01/2020-emphysema, mild tracheomalacia   04/2019-pulmonary function test- FVC 1.05 (44% predicted), postbronchodilator ratio 47, postbronchodilator FEV1 0.47 (25% predicted), DLCO 54   Spirometry 02/2017 >> ratio of 59, FEV1 of 42% FVC  of 56% ABG 01/2014 was 7.29/66/75/92%   08/2014 >> PSG neg for OSA , Desaturation as low as 85%  Allergies  Allergen Reactions   Chantix  [Varenicline ] Nausea And Vomiting   Augmentin  [Amoxicillin -Pot Clavulanate] Nausea And Vomiting    Immunization History  Administered Date(s) Administered   Fluad Quad(high Dose 65+) 07/10/2022   Fluad  Trivalent(High Dose 65+) 08/29/2023   Influenza Split 07/08/2013   Influenza,inj,Quad PF,6+ Mos 07/31/2015, 09/03/2017, 10/04/2021   PFIZER Comirnaty(Gray Top)Covid-19 Tri-Sucrose Vaccine 08/31/2020   PFIZER(Purple Top)SARS-COV-2 Vaccination 01/01/2020, 01/25/2020   PNEUMOCOCCAL CONJUGATE-20 11/14/2022   Pneumococcal Polysaccharide-23 02/02/2014, 05/03/2019   Zoster Recombinant(Shingrix) 01/21/2024    Past Medical History:  Diagnosis Date   Bronchitis    CAD (coronary artery disease)    a. 02/2002: CABG x3 with LIMA to LAD, RIMA to RCA, and SVG to OM   CHF (congestive heart failure) (HCC)    COPD (chronic obstructive pulmonary disease) (HCC)    Diabetes (HCC)    Headache    Heart disease    Hyperlipidemia    Hypertension    Hypertensive emergency 07/01/2013   Impaired glucose tolerance 08/20/2014   Sinusitis     Tobacco History: Social History   Tobacco Use  Smoking Status Former   Current packs/day: 0.00   Average packs/day: 0.5 packs/day for 47.4 years (23.7 ttl pk-yrs)   Types: Cigarettes   Start date: 12/29/1971   Quit date: 05/22/2019   Years since quitting: 4.9  Smokeless Tobacco Never  Tobacco Comments   Smoking daily 1-2 day.  Stops for a while and then goes back.  08/29/2022 hfb   Counseling given: Not Answered Tobacco comments: Smoking daily 1-2 day.  Stops for a while and then goes back.  08/29/2022 hfb   Outpatient Medications Prior to Visit  Medication Sig Dispense Refill   acetaminophen  (TYLENOL ) 500 MG tablet Take 1,000 mg by mouth 2 (two) times daily as needed for headache, fever, moderate pain or mild pain.     albuterol  (VENTOLIN  HFA) 108 (90 Base) MCG/ACT inhaler INHALE 1-2 PUFFS BY MOUTH EVERY 6 HOURS AS NEEDED FOR WHEEZE OR SHORTNESS OF BREATH 18 each 5   alendronate  (FOSAMAX ) 70 MG tablet Take 70 mg by mouth once a week.     amLODipine  (NORVASC ) 5 MG tablet TAKE 1 TABLET (5 MG TOTAL) BY MOUTH DAILY. 90 tablet 0   atorvastatin  (LIPITOR) 40 MG tablet  TAKE 1 TABLET BY MOUTH EVERY DAY 90 tablet 3   bisoprolol  (ZEBETA ) 5 MG tablet TAKE 1 TABLET (5 MG TOTAL) BY MOUTH DAILY. 90 tablet 2   Cholecalciferol  (VITAMIN D -3 PO) Take 1 capsule by mouth in the morning.     Cyanocobalamin  (VITAMIN B-12 PO) Take 1 tablet by mouth in the morning.     Dulaglutide  (TRULICITY ) 3 MG/0.5ML SOAJ Inject 3 mg as directed once a week. 6 mL 3   Dupilumab  (DUPIXENT ) 300 MG/2ML SOAJ Inject 300 mg into the skin every 14 (fourteen) days. (Patient not taking: Reported on 05/03/2024) 12 mL 1   fluticasone  (FLONASE ) 50 MCG/ACT nasal spray Place 1 spray into both nostrils daily. (Patient taking differently: Place 1 spray into both nostrils daily.) 15.8 mL 0   Fluticasone -Umeclidin-Vilant (TRELEGY ELLIPTA ) 200-62.5-25 MCG/ACT AEPB INHALE 1 PUFF BY MOUTH EVERY DAY 60 each 11   guaiFENesin -dextromethorphan  (ROBITUSSIN DM) 100-10 MG/5ML syrup Take 5 mLs by mouth every 4 (four) hours as needed for cough. 118 mL 0   insulin  aspart (NOVOLOG  FLEXPEN) 100 UNIT/ML FlexPen Three times daily  for meals. CBG 70 - 120: 0 units CBG 121 - 150: 3 units CBG 151 - 200: 4 units CBG 201 - 250: 7 units CBG 251 - 300: 11 units CBG 301 - 350: 15 units CBG 351 - 400: 20 units (Patient not taking: Reported on 05/03/2024) 15 mL 0   Insulin  Pen Needle 31G X 8 MM MISC Use as directed with insulin  pens (Patient not taking: Reported on 04/05/2024) 100 each 0   ipratropium-albuterol  (DUONEB) 0.5-2.5 (3) MG/3ML SOLN INHALE 3 ML BY NEBULIZER EVERY 6 HOURS AS NEEDED 360 mL 11   isosorbide  mononitrate (IMDUR ) 60 MG 24 hr tablet TAKE 1.5 TABLETS (90 MG TOTAL) BY MOUTH DAILY. 135 tablet 3   KLOR-CON  M10 10 MEQ tablet TAKE 1 TABLET BY MOUTH EVERY DAY 90 tablet 1   MAGNESIUM  OXIDE PO Take 1 tablet by mouth at bedtime.     metFORMIN  (GLUCOPHAGE -XR) 500 MG 24 hr tablet TAKE 2 TABLET BY MOUTH EVERY DAY WITH BREAKFAST 180 tablet 3   Multiple Vitamins-Minerals (MULTIVITAMIN WOMEN 50+) TABS Take 1 tablet by mouth in the morning.      OXYGEN  Inhale 3 L into the lungs continuous.     predniSONE  (DELTASONE ) 10 MG tablet Take 6 tablets (60 mg) daily for 2 days, then, Take 5 tablets (50 mg) daily for 2 days, then, Take 4 tablets (40 mg) daily for 2 days, then, Take 3 tablets (30 mg) daily for 2 days, then Take 2 tablets (20mg ) daily for 2 days then start your home dose of once a day (Patient not taking: Reported on 04/05/2024) 40 tablet 0   predniSONE  (DELTASONE ) 10 MG tablet Take 1 tablet (10 mg total) by mouth daily with breakfast. After completing taper 90 tablet 1   torsemide  (DEMADEX ) 20 MG tablet Take 1 tablet (20 mg total) by mouth daily. 30 tablet 2   No facility-administered medications prior to visit.   Review of Systems  Review of Systems  Constitutional: Negative.   HENT: Negative.    Respiratory: Negative.  Negative for cough, shortness of breath and wheezing.   Cardiovascular: Negative.  Negative for leg swelling.   Physical Exam  There were no vitals taken for this visit. Physical Exam Constitutional:      General: She is not in acute distress.    Appearance: Normal appearance. She is not ill-appearing.  HENT:     Head: Normocephalic and atraumatic.  Cardiovascular:     Rate and Rhythm: Normal rate and regular rhythm.  Pulmonary:     Effort: Pulmonary effort is normal.     Breath sounds: Normal breath sounds. No wheezing, rhonchi or rales.     Comments: Diminished upper lobes, no overt wheezing or crackles; 3L POC Musculoskeletal:        General: Normal range of motion.  Skin:    General: Skin is warm and dry.  Neurological:     General: No focal deficit present.     Mental Status: She is alert and oriented to person, place, and time. Mental status is at baseline.  Psychiatric:        Mood and Affect: Mood normal.        Behavior: Behavior normal.        Thought Content: Thought content normal.        Judgment: Judgment normal.      Lab Results:  CBC    Component Value Date/Time    WBC 11.5 (H) 03/22/2024 0236   RBC 5.06 03/22/2024 0236  HGB 13.9 03/22/2024 0236   HGB 15.7 06/12/2014 1543   HCT 44.9 03/22/2024 0236   HCT 48.3 (H) 06/12/2014 1543   PLT 213 03/22/2024 0236   PLT 178 06/12/2014 1543   MCV 88.7 03/22/2024 0236   MCV 91 06/12/2014 1543   MCH 27.5 03/22/2024 0236   MCHC 31.0 03/22/2024 0236   RDW 12.9 03/22/2024 0236   RDW 14.8 (H) 06/12/2014 1543   LYMPHSABS 1.2 03/11/2024 1031   MONOABS 0.4 03/11/2024 1031   EOSABS 0.1 03/11/2024 1031   BASOSABS 0.0 03/11/2024 1031    BMET    Component Value Date/Time   NA 140 03/23/2024 0253   NA 142 06/12/2014 1543   K 4.3 03/23/2024 0253   K 3.6 06/12/2014 1543   CL 89 (L) 03/23/2024 0253   CL 105 06/12/2014 1543   CO2 42 (H) 03/23/2024 0253   CO2 29 06/12/2014 1543   GLUCOSE 186 (H) 03/23/2024 0253   GLUCOSE 113 (H) 06/12/2014 1543   BUN 22 03/23/2024 0253   BUN 12 06/12/2014 1543   CREATININE 0.62 03/23/2024 0253   CREATININE 0.62 06/12/2014 1543   CALCIUM  9.4 03/23/2024 0253   CALCIUM  8.9 06/12/2014 1543   GFRNONAA >60 03/23/2024 0253   GFRNONAA >60 06/12/2014 1543   GFRAA NOT CALCULATED 01/28/2020 0205   GFRAA >60 06/12/2014 1543    BNP    Component Value Date/Time   BNP 64.3 03/18/2024 1624    ProBNP    Component Value Date/Time   PROBNP 48.0 03/21/2021 1253    Imaging: No results found.   Assessment & Plan:   1. COPD (chronic obstructive pulmonary disease) with chronic bronchitis (HCC) (Primary) - AMB referral to pulmonary rehabilitation  2. Chronic respiratory failure with hypoxia and hypercapnia (HCC) - AMB referral to pulmonary rehabilitation  Assessment and Plan    Chronic obstructive pulmonary disease (COPD)  Advanced stage COPD with chronic respiratory failure and upper lobe emphysema. No recent hospital admissions since June. Improved breathing on current regimen of prednisone  10 mg daily, Trelegy 200mcg, and ipratropiumnebulizer. Discontinued Dupixent  due to  lack of efficacy and exacerbations. No significant cough, but dry and scratchy throat likely due to mouth breathing with noninvasive ventilator. Diminished airflow in upper lobes, no acute wheezing or crackles. Emphysema changes on CT scan, no acute concerns on recent chest x-ray. Discussed noninvasive nature of home ventilator support, unpredictability of emphysema progression, and importance of avoiding respiratory infections. Potential benefits of pulmonary rehab discussed. - Continue prednisone  10 mg daily, Trelegy 200mcg daily and prn ipratropium-albuterol  nebulizer q 6 hours for breakthrough symptoms  - Consider using a chin strap to prevent mouth breathing during sleep. - Refer to pulmonary rehabilitation program. - Ensure vaccinations are up to date, including flu, COVID, and pneumonia vaccines. - Monitor for respiratory infections and seek prompt treatment if symptoms arise. - Patient is DNR/I  Chronic respiratory failure -Continue supplemental oxygen  and NIV at night   Constipation Constipation with no bowel movement in two days. Previously used Miralax  but experienced issues with bowel control. Discussed potential use of Colace as an alternative to Miralax .  - Consider using Colace 100 mg twice daily as a stool softener. - Alternatively, reduce Miralax  dose to half the recommended amount.  History of heart failure  Heart failure with prior fluid retention. Currently taking torsemide  20 mg daily. Recent weight gain managed with an extra dose of torsemide . No current swelling in legs. Expressed interest in changing cardiologist due to difficulty obtaining appointments. Discussed  potential referral to in-network cardiologists if current cardiologist does not respond. - Continue torsemide  20 mg daily. - Monitor weight and fluid retention closely. - Consider changing cardiologist if current provider remains unresponsive.  Almarie LELON Ferrari, NP 05/18/2024

## 2024-05-18 NOTE — Patient Instructions (Signed)
  VISIT SUMMARY: Today, we discussed your ongoing management of COPD and chronic respiratory failure, constipation, and heart failure with fluid retention. You reported feeling better on your current COPD regimen and have not had any recent hospitalizations. We also addressed your concerns about constipation and weight gain related to heart failure.  YOUR PLAN: -CHRONIC OBSTRUCTIVE PULMONARY DISEASE (COPD) WITH CHRONIC RESPIRATORY FAILURE AND UPPER LOBE EMPHYSEMA: COPD is a chronic lung disease that makes it hard to breathe, and emphysema is a type of COPD that damages the air sacs in the lungs. You should continue your current medications: prednisone  10 mg daily, Trelegy, and nebulizer. Consider using a chin strap to prevent mouth breathing during sleep. We recommend you join a pulmonary rehabilitation program and ensure your vaccinations are up to date. Monitor for respiratory infections and seek prompt treatment if symptoms arise.  -CONSTIPATION: Constipation means having fewer bowel movements than usual or having difficulty passing stools. You can try using Colace 100 mg twice daily as a stool softener, or you can reduce your Miralax  dose to half the recommended amount.  -HISTORY OF HEART FAILURE WITH PRIOR FLUID RETENTION: Heart failure means your heart doesn't pump blood as well as it should, which can cause fluid buildup. You should continue taking torsemide  20 mg daily and monitor your weight and fluid retention closely. If your current cardiologist remains unresponsive, consider changing to a new cardiologist.  INSTRUCTIONS: Please follow up with your primary care provider and consider the referral to a pulmonary rehabilitation program. Ensure your vaccinations are up to date, including flu, COVID, and pneumonia vaccines. Monitor your weight and fluid retention, and seek prompt treatment if you notice any respiratory infections or significant changes in your condition.  Follow-up 3 months with  Dr. Alva or Candis

## 2024-05-19 NOTE — Patient Instructions (Signed)
 Visit Information  Thank you for taking time to visit with me today. Please don't hesitate to contact me if I can be of assistance to you before our next scheduled appointment.  Your next care management appointment is by telephone on 06/18/24 at 11 am  Please call the care guide team at [phone] if you need to cancel, schedule, or reschedule an appointment.   Please call the Suicide and Crisis Lifeline: 988 call the [place]  National Suicide Prevention Lifeline: (640) 731-5487 or TTY: 325-014-4573 TTY (416)216-9674) to talk to a trained counselor call 1-800-273-TALK (toll free, 24 hour hotline) if you are experiencing a Mental Health or Behavioral Health Crisis or need someone to talk to.  Heddy Shutter, RN, MSN, BSN, CCM [place], Population Health Case Manager Phone: [phone]

## 2024-05-23 DIAGNOSIS — J449 Chronic obstructive pulmonary disease, unspecified: Secondary | ICD-10-CM | POA: Diagnosis not present

## 2024-05-24 ENCOUNTER — Telehealth (HOSPITAL_COMMUNITY): Payer: Self-pay

## 2024-05-24 NOTE — Telephone Encounter (Signed)
 Pt insurance is active and benefits verified through [facility]. Co-pay $15, DED $0/$0 met, out of pocket $3,500/$2,049.04 met, co-insurance 0%. No pre-authorization required. 05/24/2024 @ 3:50pm, spoke with Arch A., REF# [id number].

## 2024-05-25 ENCOUNTER — Telehealth (HOSPITAL_COMMUNITY): Payer: Self-pay

## 2024-05-25 NOTE — Telephone Encounter (Signed)
 Called patient to see if she was interested in participating in the Pulmonary Rehab Program. Patient will come in for orientation on 8/08 and will attend the 10:15 exercise class.  Sent MyChart message.

## 2024-05-27 ENCOUNTER — Telehealth (HOSPITAL_COMMUNITY): Payer: Self-pay

## 2024-05-27 NOTE — Telephone Encounter (Signed)
 Called to confirm appt. Pt confirmed appt. Instructed pt on proper footwear. Gave directions along with department number.

## 2024-05-28 ENCOUNTER — Encounter (HOSPITAL_COMMUNITY)
Admission: RE | Admit: 2024-05-28 | Discharge: 2024-05-28 | Disposition: A | Discharge status: Home / Self Care | Source: Ambulatory Visit | Attending: Pulmonary Disease | Admitting: Pulmonary Disease

## 2024-05-28 VITALS — Ht 61.0 in | Wt 189.4 lb

## 2024-05-28 DIAGNOSIS — J449 Chronic obstructive pulmonary disease, unspecified: Secondary | ICD-10-CM | POA: Diagnosis not present

## 2024-05-28 NOTE — Progress Notes (Signed)
 Pulmonary Individual Treatment Plan  Patient Details  Name: [name] MRN: [id number] Date of Birth: March 23, 1956 Referring Provider:   Conrad Ports Pulmonary Rehab Walk Test from 05/28/2024 in [facility]  Referring Provider [name]    Initial Encounter Date:  Flowsheet Row Pulmonary Rehab Walk Test from 05/28/2024 in [facility]  Date 05/28/24    Visit Diagnosis: Stage 4 very severe COPD by GOLD classification (HCC)  Patient's Home Medications on Admission:   Current Outpatient Medications:    acetaminophen  (TYLENOL ) 500 MG tablet, Take 1,000 mg by mouth 2 (two) times daily as needed for headache, fever, moderate pain or mild pain., Disp: , Rfl:    albuterol  (VENTOLIN  HFA) 108 (90 Base) MCG/ACT inhaler, INHALE 1-2 PUFFS BY MOUTH EVERY 6 HOURS AS NEEDED FOR WHEEZE OR SHORTNESS OF BREATH, Disp: 18 each, Rfl: 5   amLODipine  (NORVASC ) 5 MG tablet, TAKE 1 TABLET (5 MG TOTAL) BY MOUTH DAILY., Disp: 90 tablet, Rfl: 0   atorvastatin  (LIPITOR) 40 MG tablet, TAKE 1 TABLET BY MOUTH EVERY DAY, Disp: 90 tablet, Rfl: 3   bisoprolol  (ZEBETA ) 5 MG tablet, TAKE 1 TABLET (5 MG TOTAL) BY MOUTH DAILY., Disp: 90 tablet, Rfl: 2   Cholecalciferol  (VITAMIN D -3 PO), Take 1 capsule by mouth in the morning., Disp: , Rfl:    Cyanocobalamin  (VITAMIN B-12 PO), Take 1 tablet by mouth in the morning., Disp: , Rfl:    Dulaglutide  (TRULICITY ) 3 MG/0.5ML SOAJ, Inject 3 mg as directed once a week., Disp: 6 mL, Rfl: 3   fluticasone  (FLONASE ) 50 MCG/ACT nasal spray, Place 1 spray into both nostrils daily., Disp: 15.8 mL, Rfl: 0   Fluticasone -Umeclidin-Vilant (TRELEGY ELLIPTA ) 200-62.5-25 MCG/ACT AEPB, INHALE 1 PUFF BY MOUTH EVERY DAY, Disp: 60 each, Rfl: 11   guaiFENesin -dextromethorphan  (ROBITUSSIN DM) 100-10 MG/5ML syrup, Take 5 mLs by mouth every 4 (four) hours as needed for cough., Disp: 118 mL, Rfl: 0    ipratropium-albuterol  (DUONEB) 0.5-2.5 (3) MG/3ML SOLN, INHALE 3 ML BY NEBULIZER EVERY 6 HOURS AS NEEDED, Disp: 360 mL, Rfl: 11   isosorbide  mononitrate (IMDUR ) 60 MG 24 hr tablet, TAKE 1.5 TABLETS (90 MG TOTAL) BY MOUTH DAILY., Disp: 135 tablet, Rfl: 3   KLOR-CON  M10 10 MEQ tablet, TAKE 1 TABLET BY MOUTH EVERY DAY, Disp: 90 tablet, Rfl: 1   MAGNESIUM  OXIDE PO, Take 1 tablet by mouth at bedtime., Disp: , Rfl:    metFORMIN  (GLUCOPHAGE -XR) 500 MG 24 hr tablet, TAKE 2 TABLET BY MOUTH EVERY DAY WITH BREAKFAST, Disp: 180 tablet, Rfl: 3   Multiple Vitamins-Minerals (MULTIVITAMIN WOMEN 50+) TABS, Take 1 tablet by mouth in the morning., Disp: , Rfl:    OXYGEN , Inhale 3 L into the lungs continuous., Disp: , Rfl:    predniSONE  (DELTASONE ) 10 MG tablet, Take 1 tablet (10 mg total) by mouth daily with breakfast. After completing taper, Disp: 90 tablet, Rfl: 1   torsemide  (DEMADEX ) 20 MG tablet, Take 1 tablet (20 mg total) by mouth daily., Disp: 30 tablet, Rfl: 2   alendronate  (FOSAMAX ) 70 MG tablet, Take 70 mg by mouth once a week. (Patient not taking: Reported on 05/28/2024), Disp: , Rfl:    insulin  aspart (NOVOLOG  FLEXPEN) 100 UNIT/ML FlexPen, Three times daily for meals. CBG 70 - 120: 0 units CBG 121 - 150: 3 units CBG 151 - 200: 4 units CBG 201 - 250: 7 units CBG 251 - 300:  11 units CBG 301 - 350: 15 units CBG 351 - 400: 20 units (Patient not taking: Reported on 05/28/2024), Disp: 15 mL, Rfl: 0   predniSONE  (DELTASONE ) 10 MG tablet, Take 6 tablets (60 mg) daily for 2 days, then, Take 5 tablets (50 mg) daily for 2 days, then, Take 4 tablets (40 mg) daily for 2 days, then, Take 3 tablets (30 mg) daily for 2 days, then Take 2 tablets (20mg ) daily for 2 days then start your home dose of once a day (Patient not taking: Reported on 05/28/2024), Disp: 40 tablet, Rfl: 0  Past Medical History: Past Medical History:  Diagnosis Date   Bronchitis    CAD (coronary artery disease)    a. 02/2002: CABG x3 with LIMA to LAD, RIMA  to RCA, and SVG to OM   CHF (congestive heart failure) (HCC)    COPD (chronic obstructive pulmonary disease) (HCC)    Diabetes (HCC)    Headache    Heart disease    Hyperlipidemia    Hypertension    Hypertensive emergency 07/01/2013   Impaired glucose tolerance 08/20/2014   Sinusitis     Tobacco Use: Social History   Tobacco Use  Smoking Status Former   Current packs/day: 0.00   Average packs/day: 0.5 packs/day for 47.4 years (23.7 ttl pk-yrs)   Types: Cigarettes   Start date: 12/29/1971   Quit date: 05/22/2019   Years since quitting: 5.0  Smokeless Tobacco Never  Tobacco Comments   Smoking daily 1-2 day.  Stops for a while and then goes back.  08/29/2022 hfb    Labs: Review Flowsheet  More data exists      Latest Ref Rng & Units 03/15/2024 03/16/2024 03/18/2024 03/19/2024 03/23/2024  Labs for ITP Cardiac and Pulmonary Rehab  Bicarbonate 20.0 - 28.0 mmol/L 41.5  45.9  47.6  53.1  55.6   TCO2 22 - 32 mmol/L - - >50  - -  O2 Saturation % 89.7  99.7  71  91.6  86.8     Capillary Blood Glucose: Lab Results  Component Value Date   GLUCAP 140 (H) 03/23/2024   GLUCAP 271 (H) 03/22/2024   GLUCAP 433 (H) 03/22/2024   GLUCAP 207 (H) 03/22/2024   GLUCAP 219 (H) 03/22/2024     Pulmonary Assessment Scores:  Pulmonary Assessment Scores     Row Name 05/28/24 0934         ADL UCSD   ADL Phase Entry     SOB Score total 20       CAT Score   CAT Score 4       mMRC Score   mMRC Score 1       UCSD: Self-administered rating of dyspnea associated with activities of daily living (ADLs) 6-point scale (0 = not at all to 5 = maximal or unable to do because of breathlessness)  Scoring Scores range from 0 to 120.  Minimally important difference is 5 units  CAT: CAT can identify the health impairment of COPD patients and is better correlated with disease progression.  CAT has a scoring range of zero to 40. The CAT score is classified into four groups of low (less than 10),  medium (10 - 20), high (21-30) and very high (31-40) based on the impact level of disease on health status. A CAT score over 10 suggests significant symptoms.  A worsening CAT score could be explained by an exacerbation, poor medication adherence, poor inhaler technique, or progression of COPD or comorbid conditions.  CAT MCID is 2 points  mMRC: mMRC (Modified Medical Research Council) Dyspnea Scale is used to assess the degree of baseline functional disability in patients of respiratory disease due to dyspnea. No minimal important difference is established. A decrease in score of 1 point or greater is considered a positive change.   Pulmonary Function Assessment:  Pulmonary Function Assessment - 05/28/24 0934       Breath   Shortness of Breath Yes;Limiting activity          Exercise Target Goals: Exercise Program Goal: Individual exercise prescription set using results from initial 6 min walk test and THRR while considering  patient's activity barriers and safety.   Exercise Prescription Goal: Initial exercise prescription builds to 30-45 minutes a day of aerobic activity, 2-3 days per week.  Home exercise guidelines will be given to patient during program as part of exercise prescription that the participant will acknowledge.  Activity Barriers & Risk Stratification:  Activity Barriers & Cardiac Risk Stratification - 05/28/24 0934       Activity Barriers & Cardiac Risk Stratification   Activity Barriers Deconditioning;Muscular Weakness;Shortness of Breath          6 Minute Walk:  6 Minute Walk     Row Name 05/28/24 1013         6 Minute Walk   Phase Initial     Distance 1110 feet     Walk Time 6 minutes     # of Rest Breaks 0     MPH 2.1     METS 2.2     RPE 11     Perceived Dyspnea  1     VO2 Peak 7.7     Symptoms No     Resting HR 70 bpm     Resting BP 108/62     Resting Oxygen  Saturation  99 %     Exercise Oxygen  Saturation  during 6 min walk 94 %      Max Ex. HR 108 bpm     Max Ex. BP 126/60     2 Minute Post BP 110/60       Interval HR   1 Minute HR 91     2 Minute HR 91     3 Minute HR 90     4 Minute HR 108     5 Minute HR 107     6 Minute HR 104     2 Minute Post HR 70     Interval Heart Rate? Yes       Interval Oxygen    Interval Oxygen ? Yes     Baseline Oxygen  Saturation % 99 %     1 Minute Oxygen  Saturation % 100 %     1 Minute Liters of Oxygen  2 L     2 Minute Oxygen  Saturation % 99 %     2 Minute Liters of Oxygen  2 L     3 Minute Oxygen  Saturation % 94 %     3 Minute Liters of Oxygen  2 L     4 Minute Oxygen  Saturation % 96 %     4 Minute Liters of Oxygen  2 L     5 Minute Oxygen  Saturation % 97 %     5 Minute Liters of Oxygen  2 L     6 Minute Oxygen  Saturation % 96 %     6 Minute Liters of Oxygen  2 L     2 Minute Post Oxygen  Saturation % 99 %  2 Minute Post Liters of Oxygen  2 L        Oxygen  Initial Assessment:  Oxygen  Initial Assessment - 05/28/24 0932       Home Oxygen    Home Oxygen  Device Portable Concentrator;E-Tanks;Home Concentrator    Sleep Oxygen  Prescription Continuous    Liters per minute 3    Home Exercise Oxygen  Prescription Continuous    Liters per minute 3    Home Resting Oxygen  Prescription Continuous    Liters per minute 3    Compliance with Home Oxygen  Use Yes      Initial 6 min Walk   Oxygen  Used Continuous    Liters per minute 3      Program Oxygen  Prescription   Program Oxygen  Prescription Continuous    Liters per minute 3      Intervention   Short Term Goals To learn and exhibit compliance with exercise, home and travel O2 prescription;To learn and understand importance of maintaining oxygen  saturations>88%;To learn and demonstrate proper use of respiratory medications;To learn and understand importance of monitoring SPO2 with pulse oximeter and demonstrate accurate use of the pulse oximeter.;To learn and demonstrate proper pursed lip breathing techniques or other breathing  techniques.     Long  Term Goals Exhibits compliance with exercise, home  and travel O2 prescription;Verbalizes importance of monitoring SPO2 with pulse oximeter and return demonstration;Maintenance of O2 saturations>88%;Exhibits proper breathing techniques, such as pursed lip breathing or other method taught during program session;Compliance with respiratory medication;Demonstrates proper use of MDI's          Oxygen  Re-Evaluation:   Oxygen  Discharge (Final Oxygen  Re-Evaluation):   Initial Exercise Prescription:  Initial Exercise Prescription - 05/28/24 1000       Date of Initial Exercise RX and Referring Provider   Date 05/28/24    Referring Provider [name]    Expected Discharge Date 08/24/24      Oxygen    Oxygen  Continuous    Liters 3    Maintain Oxygen  Saturation 88% or higher      Treadmill   MPH 1.7    Grade 1    Minutes 15    METs 2.3      Recumbant Elliptical   Level 2    RPM 50    Watts 60    Minutes 15    METs 2      Prescription Details   Frequency (times per week) 2    Duration Progress to 30 minutes of continuous aerobic without signs/symptoms of physical distress      Intensity   THRR 40-80% of Max Heartrate 61-122    Ratings of Perceived Exertion 11-13    Perceived Dyspnea 0-4      Progression   Progression Continue to progress workloads to maintain intensity without signs/symptoms of physical distress.      Resistance Training   Training Prescription Yes    Weight blue bands    Reps 10-15          Perform Capillary Blood Glucose checks as needed.  Exercise Prescription Changes:   Exercise Comments:   Exercise Goals and Review:   Exercise Goals     Row Name 05/28/24 0935             Exercise Goals   Increase Physical Activity Yes       Intervention Provide advice, education, support and counseling about physical activity/exercise needs.;Develop an individualized exercise prescription for aerobic and resistive training based  on initial evaluation findings, risk stratification, comorbidities and  participant's personal goals.       Expected Outcomes Short Term: Attend rehab on a regular basis to increase amount of physical activity.;Long Term: Add in home exercise to make exercise part of routine and to increase amount of physical activity.;Long Term: Exercising regularly at least 3-5 days a week.       Increase Strength and Stamina Yes       Intervention Provide advice, education, support and counseling about physical activity/exercise needs.;Develop an individualized exercise prescription for aerobic and resistive training based on initial evaluation findings, risk stratification, comorbidities and participant's personal goals.       Expected Outcomes Short Term: Increase workloads from initial exercise prescription for resistance, speed, and METs.;Short Term: Perform resistance training exercises routinely during rehab and add in resistance training at home;Long Term: Improve cardiorespiratory fitness, muscular endurance and strength as measured by increased METs and functional capacity ( )       Able to understand and use rate of perceived exertion (RPE) scale Yes       Intervention Provide education and explanation on how to use RPE scale       Expected Outcomes Short Term: Able to use RPE daily in rehab to express subjective intensity level;Long Term:  Able to use RPE to guide intensity level when exercising independently       Able to understand and use Dyspnea scale Yes       Intervention Provide education and explanation on how to use Dyspnea scale       Expected Outcomes Short Term: Able to use Dyspnea scale daily in rehab to express subjective sense of shortness of breath during exertion;Long Term: Able to use Dyspnea scale to guide intensity level when exercising independently       Knowledge and understanding of Target Heart Rate Range (THRR) Yes       Intervention Provide education and explanation of THRR  including how the numbers were predicted and where they are located for reference       Expected Outcomes Short Term: Able to state/look up THRR;Long Term: Able to use THRR to govern intensity when exercising independently;Short Term: Able to use daily as guideline for intensity in rehab       Understanding of Exercise Prescription Yes       Intervention Provide education, explanation, and written materials on patient's individual exercise prescription       Expected Outcomes Short Term: Able to explain program exercise prescription;Long Term: Able to explain home exercise prescription to exercise independently          Exercise Goals Re-Evaluation :   Discharge Exercise Prescription (Final Exercise Prescription Changes):   Nutrition:  Target Goals: Understanding of nutrition guidelines, daily intake of sodium 1500mg , cholesterol 200mg , calories 30% from fat and 7% or less from saturated fats, daily to have 5 or more servings of fruits and vegetables.  Biometrics:    Nutrition Therapy Plan and Nutrition Goals:   Nutrition Assessments:  MEDIFICTS Score Key: >=70 Need to make dietary changes  40-70 Heart Healthy Diet <= 40 Therapeutic Level Cholesterol Diet   Picture Your Plate Scores: <59 Unhealthy dietary pattern with much room for improvement. 41-50 Dietary pattern unlikely to meet recommendations for good health and room for improvement. 51-60 More healthful dietary pattern, with some room for improvement.  >60 Healthy dietary pattern, although there may be some specific behaviors that could be improved.    Nutrition Goals Re-Evaluation:   Nutrition Goals Discharge (Final Nutrition Goals Re-Evaluation):  Psychosocial: Target Goals: Acknowledge presence or absence of significant depression and/or stress, maximize coping skills, provide positive support system. Participant is able to verbalize types and ability to use techniques and skills needed for reducing stress  and depression.  Initial Review & Psychosocial Screening:  Initial Psych Review & Screening - 05/28/24 0929       Initial Review   Current issues with None Identified      Family Dynamics   Good Support System? Yes    Comments sister and sons      Barriers   Psychosocial barriers to participate in program There are no identifiable barriers or psychosocial needs.      Screening Interventions   Interventions Encouraged to exercise          Quality of Life Scores:  Scores of 19 and below usually indicate a poorer quality of life in these areas.  A difference of  2-3 points is a clinically meaningful difference.  A difference of 2-3 points in the total score of the Quality of Life Index has been associated with significant improvement in overall quality of life, self-image, physical symptoms, and general health in studies assessing change in quality of life.  PHQ-9: Review Flowsheet  More data exists      05/28/2024 04/20/2024 04/01/2024 03/26/2024 03/11/2024  Depression screen PHQ 2/9  Decreased Interest 0 0 0 0 0  Down, Depressed, Hopeless 0 0 0 0 0  PHQ - 2 Score 0 0 0 0 0  Altered sleeping 0 - - - -  Tired, decreased energy 0 - - - -  Change in appetite 0 - - - -  Feeling bad or failure about yourself  0 - - - -  Trouble concentrating 0 - - - -  Moving slowly or fidgety/restless 0 - - - -  Suicidal thoughts 0 - - - -  PHQ-9 Score 0 - - - -  Difficult doing work/chores Not difficult at all - - - -   Interpretation of Total Score  Total Score Depression Severity:  1-4 = Minimal depression, 5-9 = Mild depression, 10-14 = Moderate depression, 15-19 = Moderately severe depression, 20-27 = Severe depression   Psychosocial Evaluation and Intervention:  Psychosocial Evaluation - 05/28/24 0930       Psychosocial Evaluation & Interventions   Interventions Encouraged to exercise with the program and follow exercise prescription    Comments Karagan denies any psychosocial barriers  at this time.    Expected Outcomes For [name] to participate in rehab free of psychosocial barriers.    Continue Psychosocial Services  No Follow up required          Psychosocial Re-Evaluation:   Psychosocial Discharge (Final Psychosocial Re-Evaluation):   Education: Education Goals: Education classes will be provided on a weekly basis, covering required topics. Participant will state understanding/return demonstration of topics presented.  Learning Barriers/Preferences:  Learning Barriers/Preferences - 05/28/24 0931       Learning Barriers/Preferences   Learning Preferences None          Education Topics: Know Your Numbers Group instruction that is supported by a PowerPoint presentation. Instructor discusses importance of knowing and understanding resting, exercise, and post-exercise oxygen  saturation, heart rate, and blood pressure. Oxygen  saturation, heart rate, blood pressure, rating of perceived exertion, and dyspnea are reviewed along with a normal range for these values.    Exercise for the Pulmonary Patient Group instruction that is supported by a PowerPoint presentation. Instructor discusses benefits of exercise, core components  of exercise, frequency, duration, and intensity of an exercise routine, importance of utilizing pulse oximetry during exercise, safety while exercising, and options of places to exercise outside of rehab.    MET Level  Group instruction provided by PowerPoint, verbal discussion, and written material to support subject matter. Instructor reviews what METs are and how to increase METs.    Pulmonary Medications Verbally interactive group education provided by instructor with focus on inhaled medications and proper administration.   Anatomy and Physiology of the Respiratory System Group instruction provided by PowerPoint, verbal discussion, and written material to support subject matter. Instructor reviews respiratory cycle and anatomical  components of the respiratory system and their functions. Instructor also reviews differences in obstructive and restrictive respiratory diseases with examples of each.    Oxygen  Safety Group instruction provided by PowerPoint, verbal discussion, and written material to support subject matter. There is an overview of "What is Oxygen " and "Why do we need it".  Instructor also reviews how to create a safe environment for oxygen  use, the importance of using oxygen  as prescribed, and the risks of noncompliance. There is a brief discussion on traveling with oxygen  and resources the patient may utilize.   Oxygen  Use Group instruction provided by PowerPoint, verbal discussion, and written material to discuss how supplemental oxygen  is prescribed and different types of oxygen  supply systems. Resources for more information are provided.    Breathing Techniques Group instruction that is supported by demonstration and informational handouts. Instructor discusses the benefits of pursed lip and diaphragmatic breathing and detailed demonstration on how to perform both.     Risk Factor Reduction Group instruction that is supported by a PowerPoint presentation. Instructor discusses the definition of a risk factor, different risk factors for pulmonary disease, and how the heart and lungs work together.   Pulmonary Diseases Group instruction provided by PowerPoint, verbal discussion, and written material to support subject matter. Instructor gives an overview of the different type of pulmonary diseases. There is also a discussion on risk factors and symptoms as well as ways to manage the diseases.   Stress and Energy Conservation Group instruction provided by PowerPoint, verbal discussion, and written material to support subject matter. Instructor gives an overview of stress and the impact it can have on the body. Instructor also reviews ways to reduce stress. There is also a discussion on energy conservation  and ways to conserve energy throughout the day.   Warning Signs and Symptoms Group instruction provided by PowerPoint, verbal discussion, and written material to support subject matter. Instructor reviews warning signs and symptoms of stroke, heart attack, cold and flu. Instructor also reviews ways to prevent the spread of infection.   Other Education Group or individual verbal, written, or video instructions that support the educational goals of the pulmonary rehab program.    Knowledge Questionnaire Score:  Knowledge Questionnaire Score - 05/28/24 0931       Knowledge Questionnaire Score   Pre Score 17/18          Core Components/Risk Factors/Patient Goals at Admission:  Personal Goals and Risk Factors at Admission - 05/28/24 0931       Core Components/Risk Factors/Patient Goals on Admission   Improve shortness of breath with ADL's Yes    Intervention Provide education, individualized exercise plan and daily activity instruction to help decrease symptoms of SOB with activities of daily living.    Expected Outcomes Short Term: Improve cardiorespiratory fitness to achieve a reduction of symptoms when performing ADLs;Long Term: Be able  to perform more ADLs without symptoms or delay the onset of symptoms          Core Components/Risk Factors/Patient Goals Review:    Core Components/Risk Factors/Patient Goals at Discharge (Final Review):    ITP Comments: Dr. Slater Staff is Medical Director for Pulmonary Rehab at [facility].

## 2024-05-28 NOTE — Progress Notes (Signed)
 [name] 68 y.o. female Pulmonary Rehab Orientation Note This patient who was referred to Pulmonary Rehab by Dr. [name] with the diagnosis of COPD 4 arrived today in Cardiac and Pulmonary Rehab. She  arrived ambulatory with normal gait. She  does carry portable oxygen . [employer] is the provider for their DME. Per patient, [name] uses oxygen  continuously. Color good, skin warm and dry. Patient is oriented to time and place. Patient's medical history, psychosocial health, and medications reviewed. Psychosocial assessment reveals patient lives with alone. [name] is currently retired. Patient hobbies include spending time with others and shopping. Patient reports her stress level is low. Areas of stress/anxiety include health. Patient does not exhibit signs of depression. PHQ2/9 score 0/0. [name] shows good  coping skills with positive outlook on life. Offered emotional support and reassurance. Will continue to monitor. Physical assessment performed by Rhonda Levin RN. Please see their orientation physical assessment note. [name] reports she does take medications as prescribed. Patient states she follows a diabetic diet. The patient reports no specific efforts to gain or lose weight.. Patient's weight will be monitored closely. Demonstration and practice of PLB using pulse oximeter. [name] able to return demonstration satisfactorily. Safety and hand hygiene in the exercise area reviewed with patient. [name] voices understanding of the information reviewed. Department expectations discussed with patient and achievable goals were set. The patient shows enthusiasm about attending the program and we look forward to working with Rhonda. [name] completed a 6 min walk test today and is scheduled to begin exercise on 06/01/24 at 10:15 am.  [id number] Rhonda Rhonda Moats, MS, ACSM-CEP

## 2024-05-28 NOTE — Progress Notes (Addendum)
 Pulmonary Rehab Orientation Physical Assessment Note    Well appearing, A&Ox4, NAD Eyes/Ears: WNL  Lungs: + rhonchi throughout, denies chronic cough, dyspnea on exertion, wearing 3L [place] POC today at appointment, states she wears 3L continuously, bleeds in 3L through BiPap QHS Heart: Regular rate rhythm, no murmurs, no rubs, no clicks Gastrointestinal: abdomin soft, + bowel sounds in all 4 quads, denies recent weight gain or loss, endorses normal BMs Genitourinary: WNL Extremities:  +2 pulses, grip strength equal, strong, no edema, no cyanosis, no clubbing Integumentary: pt denies any rashes, open or non healing wounds Psy/Soc: Pt denies any psy/soc needs Assistive devices: Pt carriers portable oxygen  concentrator (POC), has home concentrator, and tanks

## 2024-05-28 NOTE — Progress Notes (Signed)
 [name] 68 y.o. female  Initial Psychosocial Assessment  Pt psychosocial assessment reveals pt lives alone. Pt is currently retired. Pt hobbies include spending time with others and shopping. Pt reports her stress level is low. Areas of stress/anxiety include health.  Pt does not exhibit signs of depression. Pt shows good  coping skills with positive outlook . Offered emotional support and reassurance. Will continue to monitor.    05/28/2024 12:32 PM

## 2024-06-01 ENCOUNTER — Encounter (HOSPITAL_COMMUNITY)
Admission: RE | Admit: 2024-06-01 | Discharge: 2024-06-01 | Disposition: A | Discharge status: Home / Self Care | Source: Ambulatory Visit | Attending: Pulmonary Disease | Admitting: Pulmonary Disease

## 2024-06-01 DIAGNOSIS — J449 Chronic obstructive pulmonary disease, unspecified: Secondary | ICD-10-CM

## 2024-06-01 NOTE — Progress Notes (Signed)
 Daily Session Note  Patient Details  Name: [name] MRN: [id number] Date of Birth: July 17, 1956 Referring Provider:   Conrad Fleming Pulmonary Rehab Walk Test from 05/28/2024 in [facility] for Heart, Vascular, & Lung Health  Referring Provider [name]    Encounter Date: 06/01/2024  Check In:  Session Check In - 06/01/24 1205       Check-In   Supervising physician immediately available to respond to emergencies [facility] MD immediately available    Physician(s) Rosabel Mose, NP    Location [facility]    Staff Present Johnnie Moats, MS, ACSM-CEP, Exercise Physiologist;Carlette Bernett, RN, Wetzel Sharps, RT    Virtual Visit No    Medication changes reported     No    Fall or balance concerns reported    No    Tobacco Cessation No Change    Warm-up and Cool-down Performed as group-led instruction    Resistance Training Performed Yes    VAD Patient? No    PAD/SET Patient? No      Pain Assessment   Currently in Pain? No/denies    Multiple Pain Sites No          Capillary Blood Glucose: No results found for this or any previous visit (from the past 24 hours).    Social History   Tobacco Use  Smoking Status Former   Current packs/day: 0.00   Average packs/day: 0.5 packs/day for 47.4 years (23.7 ttl pk-yrs)   Types: Cigarettes   Start date: 12/29/1971   Quit date: 05/22/2019   Years since quitting: 5.0  Smokeless Tobacco Never  Tobacco Comments   Smoking daily 1-2 day.  Stops for a while and then goes back.  08/29/2022 hfb    Goals Met:  Proper associated with RPD/PD & O2 Sat Independence with exercise equipment Exercise tolerated well No report of concerns or symptoms today Strength training completed today  Goals Unmet:  Not Applicable  Comments: Service time is from 1016 to 1129.    Dr. Slater Staff is Medical Director for Pulmonary Rehab at [facility].

## 2024-06-02 ENCOUNTER — Other Ambulatory Visit: Payer: Self-pay | Admitting: Internal Medicine

## 2024-06-02 LAB — GLUCOSE, CAPILLARY
Glucose-Capillary: 216 mg/dL — ABNORMAL HIGH (ref 70–99)
Glucose-Capillary: 101 mg/dL — ABNORMAL HIGH (ref 70–99)

## 2024-06-03 ENCOUNTER — Encounter (HOSPITAL_COMMUNITY)
Admission: RE | Admit: 2024-06-03 | Discharge: 2024-06-03 | Disposition: A | Discharge status: Home / Self Care | Source: Ambulatory Visit | Attending: Pulmonary Disease | Admitting: Pulmonary Disease

## 2024-06-03 DIAGNOSIS — J449 Chronic obstructive pulmonary disease, unspecified: Secondary | ICD-10-CM

## 2024-06-03 LAB — GLUCOSE, CAPILLARY
Glucose-Capillary: 206 mg/dL — ABNORMAL HIGH (ref 70–99)
Glucose-Capillary: 126 mg/dL — ABNORMAL HIGH (ref 70–99)

## 2024-06-03 NOTE — Progress Notes (Signed)
 Daily Session Note  Patient Details  Name: [name] MRN: [id number] Date of Birth: October 08, 1956 Referring Provider:   Conrad Ports Pulmonary Rehab Walk Test from 05/28/2024 in [facility] for Heart, Vascular, & Lung Health  Referring Provider [name]    Encounter Date: 06/03/2024  Check In:  Session Check In - 06/03/24 0819       Check-In   Supervising physician immediately available to respond to emergencies [facility] immediately available    Physician(s) Damien Braver, NP    [facility]    Staff Present Johnnie Moats, MS, ACSM-CEP, Exercise Physiologist;Drucella Karbowski Claudene Candia Levin, RN, BSN    Virtual Visit No    Medication changes reported     No    Fall or balance concerns reported    No    Tobacco Cessation No Change    Warm-up and Cool-down Performed as group-led instruction    Resistance Training Performed Yes    VAD Patient? No    PAD/SET Patient? No      Pain Assessment   Currently in Pain? No/denies    Multiple Pain Sites No          Capillary Blood Glucose: No results found for this or any previous visit (from the past 24 hours).    Social History   Tobacco Use  Smoking Status Former   Current packs/day: 0.00   Average packs/day: 0.5 packs/day for 47.4 years (23.7 ttl pk-yrs)   Types: Cigarettes   Start date: 12/29/1971   Quit date: 05/22/2019   Years since quitting: 5.0  Smokeless Tobacco Never  Tobacco Comments   Smoking daily 1-2 day.  Stops for a while and then goes back.  08/29/2022 hfb    Goals Met:  Proper associated with RPD/PD & O2 Sat Independence with exercise equipment Exercise tolerated well No report of concerns or symptoms today Strength training completed today  Goals Unmet:  Not Applicable  Comments: Service time is from 0806 to 0915.    Dr. Slater Staff is Medical Director for Pulmonary Rehab at [facility].

## 2024-06-08 ENCOUNTER — Encounter (HOSPITAL_COMMUNITY)
Admission: RE | Admit: 2024-06-08 | Discharge: 2024-06-08 | Discharge status: Home / Self Care | Source: Ambulatory Visit | Attending: Pulmonary Disease

## 2024-06-08 VITALS — Wt 189.8 lb

## 2024-06-08 DIAGNOSIS — J449 Chronic obstructive pulmonary disease, unspecified: Secondary | ICD-10-CM | POA: Diagnosis not present

## 2024-06-08 LAB — GLUCOSE, CAPILLARY
Glucose-Capillary: 133 mg/dL — ABNORMAL HIGH (ref 70–99)
Glucose-Capillary: 121 mg/dL — ABNORMAL HIGH (ref 70–99)

## 2024-06-08 NOTE — Progress Notes (Addendum)
 Daily Session Note  Patient Details  Name: [name] MRN: [id number] Date of Birth: 1955/11/18 Referring Provider:   Conrad Ports Pulmonary Rehab Walk Test from 05/28/2024 in [facility] for Heart, Vascular, & Lung Health  Referring Provider [name]    Encounter Date: 06/08/2024  Check In:  Session Check In - 06/08/24 0806       Check-In   Supervising physician immediately available to respond to emergencies [facility] MD immediately available    Physician(s) Lum Organ, NP    [facility]    Staff Present Johnnie Moats, MS, ACSM-CEP, Exercise Physiologist;Casey Claudene Candia Levin, RN, BSN;Johnny Porter, MS, Exercise Physiologist    Virtual Visit No    Medication changes reported     No    Fall or balance concerns reported    No    Tobacco Cessation No Change    Warm-up and Cool-down Performed as group-led instruction    Resistance Training Performed Yes    VAD Patient? No    PAD/SET Patient? No      Pain Assessment   Currently in Pain? No/denies    Multiple Pain Sites No          Capillary Blood Glucose: Results for orders placed or performed during the hospital encounter of 06/08/24 (from the past 24 hours)  Glucose, capillary     Status: Abnormal   Collection Time: 06/08/24  8:26 AM  Result Value Ref Range   Glucose-Capillary 133 (H) 70 - 99 mg/dL  Glucose, capillary     Status: Abnormal   Collection Time: 06/08/24  9:04 AM  Result Value Ref Range   Glucose-Capillary 121 (H) 70 - 99 mg/dL    POCT Glucose - 91/80/74 0924       POCT Blood Glucose   Pre-Exercise 133 mg/dL    Post-Exercise 878 mg/dL          Exercise Prescription Changes - 06/08/24 0800       Response to Exercise   Blood Pressure (Admit) 108/54    Blood Pressure (Exercise) 140/68    Blood Pressure (Exit) 100/56    Heart Rate (Admit) 73 bpm    Heart Rate (Exercise) 94 bpm    Heart Rate (Exit) 84 bpm    Oxygen  Saturation (Admit) 100 %    3L   Oxygen  Saturation (Exercise) 99 %   3L   Oxygen  Saturation (Exit) 98 %   3L   Rating of Perceived Exertion (Exercise) 11    Perceived Dyspnea (Exercise) 1    Duration Continue with 30 min of aerobic exercise without signs/symptoms of physical distress.    Intensity THRR unchanged      Resistance Training   Training Prescription Yes    Weight blue bands    Reps 10-15    Time 10 Minutes      Interval Training   Interval Training No      Oxygen    Oxygen  Continuous    Liters 3      Treadmill   MPH 2.5    Grade 1    Minutes 15    METs 3.1      Recumbant Elliptical   Level 2    Watts 72    Minutes 15    METs 3.1      Oxygen    Maintain Oxygen  Saturation 88% or higher          Social History   Tobacco Use  Smoking Status Former  Current packs/day: 0.00   Average packs/day: 0.5 packs/day for 47.4 years (23.7 ttl pk-yrs)   Types: Cigarettes   Start date: 12/29/1971   Quit date: 05/22/2019   Years since quitting: 5.0  Smokeless Tobacco Never  Tobacco Comments   Smoking daily 1-2 day.  Stops for a while and then goes back.  08/29/2022 hfb    Goals Met:  Exercise tolerated well Queuing for purse lip breathing No report of concerns or symptoms today Strength training completed today  Goals Unmet:  Not Applicable  Comments: Service time is from 0804 to 0907    Dr. Slater Staff is Medical Director for Pulmonary Rehab at [facility].

## 2024-06-10 ENCOUNTER — Encounter (HOSPITAL_COMMUNITY)
Admission: RE | Admit: 2024-06-10 | Discharge: 2024-06-10 | Discharge status: Home / Self Care | Source: Ambulatory Visit | Attending: Pulmonary Disease

## 2024-06-10 DIAGNOSIS — J449 Chronic obstructive pulmonary disease, unspecified: Secondary | ICD-10-CM

## 2024-06-10 LAB — GLUCOSE, CAPILLARY
Glucose-Capillary: 143 mg/dL — ABNORMAL HIGH (ref 70–99)
Glucose-Capillary: 121 mg/dL — ABNORMAL HIGH (ref 70–99)

## 2024-06-10 NOTE — Progress Notes (Signed)
 Daily Session Note  Patient Details  Name: [name] MRN: [id number] Date of Birth: July 15, 1956 Referring Provider:   Conrad Ports Pulmonary Rehab Walk Test from 05/28/2024 in [facility] for Heart, Vascular, & Lung Health  Referring Provider [name]    Encounter Date: 06/10/2024  Check In:  Session Check In - 06/10/24 9173       Check-In   Supervising physician immediately available to respond to emergencies [facility] immediately available    Physician(s) Rosaline Skains, NP    [facility]    Staff Present Johnnie Moats, MS, ACSM-CEP, Exercise Physiologist;Corrissa Martello Claudene Candia Levin, RN, BSN    Virtual Visit No    Medication changes reported     No    Fall or balance concerns reported    No    Tobacco Cessation No Change    Warm-up and Cool-down Performed as group-led instruction    Resistance Training Performed Yes    VAD Patient? No    PAD/SET Patient? No      Pain Assessment   Currently in Pain? No/denies    Multiple Pain Sites No          Capillary Blood Glucose: Results for orders placed or performed during the hospital encounter of 06/10/24 (from the past 24 hours)  Glucose, capillary     Status: Abnormal   Collection Time: 06/10/24  8:18 AM  Result Value Ref Range   Glucose-Capillary 143 (H) 70 - 99 mg/dL  Glucose, capillary     Status: Abnormal   Collection Time: 06/10/24  9:34 AM  Result Value Ref Range   Glucose-Capillary 121 (H) 70 - 99 mg/dL      Social History   Tobacco Use  Smoking Status Former   Current packs/day: 0.00   Average packs/day: 0.5 packs/day for 47.4 years (23.7 ttl pk-yrs)   Types: Cigarettes   Start date: 12/29/1971   Quit date: 05/22/2019   Years since quitting: 5.0  Smokeless Tobacco Never  Tobacco Comments   Smoking daily 1-2 day.  Stops for a while and then goes back.  08/29/2022 hfb    Goals Met:  Proper associated with RPD/PD & O2 Sat Independence with exercise  equipment Exercise tolerated well No report of concerns or symptoms today Strength training completed today  Goals Unmet:  Not Applicable  Comments: Service time is from 0812 to 0936.    Dr. Slater Staff is Medical Director for Pulmonary Rehab at [facility].

## 2024-06-15 ENCOUNTER — Encounter (HOSPITAL_COMMUNITY)
Admission: RE | Admit: 2024-06-15 | Discharge: 2024-06-15 | Disposition: A | Discharge status: Home / Self Care | Source: Ambulatory Visit | Attending: Pulmonary Disease

## 2024-06-15 DIAGNOSIS — J449 Chronic obstructive pulmonary disease, unspecified: Secondary | ICD-10-CM

## 2024-06-15 NOTE — Progress Notes (Signed)
 Daily Session Note  Patient Details  Name: [name] MRN: [id number] Date of Birth: 1956/08/19 Referring Provider:   Conrad Ports Pulmonary Rehab Walk Test from 05/28/2024 in [facility] for Heart, Vascular, & Lung Health  Referring Provider [name]    Encounter Date: 06/15/2024  Check In:  Session Check In - 06/15/24 9187       Check-In   Supervising physician immediately available to respond to emergencies [facility] MD immediately available    Physician(s) Rosaline Skains, NP    [facility]    Staff Present Johnnie Moats, MS, ACSM-CEP, Exercise Physiologist;Casey Claudene Candia Levin, RN, BSN;Randi Reeve BS, ACSM-CEP, Exercise Physiologist    Virtual Visit No    Medication changes reported     No    Fall or balance concerns reported    No    Tobacco Cessation No Change    Warm-up and Cool-down Performed as group-led instruction    Resistance Training Performed Yes    VAD Patient? No    PAD/SET Patient? No      Pain Assessment   Currently in Pain? No/denies    Multiple Pain Sites No          Capillary Blood Glucose: No results found for this or any previous visit (from the past 24 hours).    Social History   Tobacco Use  Smoking Status Former   Current packs/day: 0.00   Average packs/day: 0.5 packs/day for 47.4 years (23.7 ttl pk-yrs)   Types: Cigarettes   Start date: 12/29/1971   Quit date: 05/22/2019   Years since quitting: 5.0  Smokeless Tobacco Never  Tobacco Comments   Smoking daily 1-2 day.  Stops for a while and then goes back.  08/29/2022 hfb    Goals Met:  Proper associated with RPD/PD & O2 Sat Exercise tolerated well No report of concerns or symptoms today Strength training completed today  Goals Unmet:  Not Applicable  Comments: Service time is from 0807 to 0912.    Dr. Slater Staff is Medical Director for Pulmonary Rehab at [facility].

## 2024-06-16 ENCOUNTER — Ambulatory Visit (INDEPENDENT_AMBULATORY_CARE_PROVIDER_SITE_OTHER): Payer: Medicare HMO

## 2024-06-16 VITALS — Ht 61.0 in | Wt 189.0 lb

## 2024-06-16 DIAGNOSIS — Z0000 Encounter for general adult medical examination without abnormal findings: Secondary | ICD-10-CM | POA: Diagnosis not present

## 2024-06-16 NOTE — Patient Instructions (Addendum)
 [name] , Thank you for taking time out of your busy schedule to complete your Annual Wellness Visit with me. I enjoyed our conversation and look forward to speaking with you again next year. I, as well as your care team,  appreciate your ongoing commitment to your health goals. Please review the following plan we discussed and let me know if I can assist you in the future. Your Game plan/ To Do List    Referrals: If you haven't heard from the office you've been referred to, please reach out to them at the phone provided.   Follow up Visits: We will see or speak with you next year for your Next Medicare AWV with our clinical staff Have you seen your provider in the last 6 months (3 months if uncontrolled diabetes)? Yes  Clinician Recommendations:  Aim for 30 minutes of exercise or brisk walking, 6-8 glasses of water, and 5 servings of fruits and vegetables each day.       This is a list of the screenings recommended for you:  Health Maintenance  Topic Date Due   DTaP/Tdap/Td vaccine (1 - Tdap) Never done   Zoster (Shingles) Vaccine (2 of 2) 03/17/2024   Flu Shot  05/21/2024   Colon Cancer Screening  09/02/2024   Hemoglobin A1C  09/11/2024   Screening for Lung Cancer  12/07/2024   Eye exam for diabetics  12/30/2024   Yearly kidney health urinalysis for diabetes  03/11/2025   Complete foot exam   03/11/2025   Yearly kidney function blood test for diabetes  03/23/2025   Medicare Annual Wellness Visit  06/16/2025   Mammogram  06/17/2025   Pneumococcal Vaccine for age over 68  Completed   DEXA scan (bone density measurement)  Completed   Hepatitis C Screening  Completed   HPV Vaccine  Aged Out   Meningitis B Vaccine  Aged Out   COVID-19 Vaccine  Discontinued    Advanced directives: (In Chart) A copy of your advanced directives are scanned into your chart should your provider ever need it. Advance Care Planning is important because it:  [x]  Makes sure you receive the medical care  that is consistent with your values, goals, and preferences  [x]  It provides guidance to your family and loved ones and reduces their decisional burden about whether or not they are making the right decisions based on your wishes.  Follow the link provided in your after visit summary or read over the paperwork we have mailed to you to help you started getting your Advance Directives in place. If you need assistance in completing these, please reach out to us  so that we can help you!

## 2024-06-16 NOTE — Progress Notes (Signed)
 Subjective:   [name] is a 68 y.o. who presents for a Medicare Wellness preventive visit.  As a reminder, Annual Wellness Visits don't include a physical exam, and some assessments may be limited, especially if this visit is performed virtually. We may recommend an in-person follow-up visit with your provider if needed.  Visit Complete: Virtual I connected with  [name] on 06/16/24 by a audio enabled telemedicine application and verified that I am speaking with the correct person using two identifiers.  Patient Location: Home  Provider Location: Office/Clinic  I discussed the limitations of evaluation and management by telemedicine. The patient expressed understanding and agreed to proceed.  Vital Signs: Because this visit was a virtual/telehealth visit, some criteria may be missing or patient reported. Any vitals not documented were not able to be obtained and vitals that have been documented are patient reported.  VideoDeclined- This patient declined Librarian, academic. Therefore the visit was completed with audio only.  Persons Participating in Visit: Patient.  AWV Questionnaire: No: Patient Medicare AWV questionnaire was not completed prior to this visit.  Cardiac Risk Factors include: advanced age (>3men, >52 women);diabetes mellitus;dyslipidemia;hypertension;obesity (BMI >30kg/m2)     Objective:    Today's Vitals   06/16/24 1353  Weight: 189 lb (85.7 kg)  Height: 5' 1 (1.549 m)   Body mass index is 35.71 kg/m.     06/16/2024    1:53 PM 05/28/2024    9:24 AM 05/03/2024   10:39 AM 03/19/2024   12:16 AM 03/15/2024    3:20 PM 03/15/2024    4:09 AM 12/08/2023    7:57 AM  Advanced Directives  Does Patient Have a Medical Advance Directive? Yes Yes Yes Yes No No No  Type of Estate agent of [place];Living will Healthcare Power of [place];Living will Healthcare Power of [place];Living will Healthcare Power  of [place];Living will     Does patient want to make changes to medical advance directive? No - Patient declined No - Patient declined No - Patient declined No - Patient declined     Copy of Healthcare Power of Attorney in Chart? Yes - validated most recent copy scanned in chart (See row information)  No - copy requested No - copy requested     Would patient like information on creating a medical advance directive?     No - Patient declined  No - Patient declined    Current Medications (verified) Outpatient Encounter Medications as of 06/16/2024  Medication Sig   acetaminophen  (TYLENOL ) 500 MG tablet Take 1,000 mg by mouth 2 (two) times daily as needed for headache, fever, moderate pain or mild pain.   albuterol  (VENTOLIN  HFA) 108 (90 Base) MCG/ACT inhaler INHALE 1-2 PUFFS BY MOUTH EVERY 6 HOURS AS NEEDED FOR WHEEZE OR SHORTNESS OF BREATH   alendronate  (FOSAMAX ) 70 MG tablet TAKE 1 TABLET (70 MG TOTAL) BY MOUTH EVERY 7 DAYS WITH FULL GLASS WATER ON EMPTY STOMACH   amLODipine  (NORVASC ) 5 MG tablet TAKE 1 TABLET (5 MG TOTAL) BY MOUTH DAILY.   atorvastatin  (LIPITOR) 40 MG tablet TAKE 1 TABLET BY MOUTH EVERY DAY   bisoprolol  (ZEBETA ) 5 MG tablet TAKE 1 TABLET (5 MG TOTAL) BY MOUTH DAILY.   Cholecalciferol  (VITAMIN D -3 PO) Take 1 capsule by mouth in the morning.   Cyanocobalamin  (VITAMIN B-12 PO) Take 1 tablet by mouth in the morning.   Dulaglutide  (TRULICITY ) 3 MG/0.5ML SOAJ Inject 3 mg as directed once a week.   fluticasone  (  FLONASE ) 50 MCG/ACT nasal spray Place 1 spray into both nostrils daily.   Fluticasone -Umeclidin-Vilant (TRELEGY ELLIPTA ) 200-62.5-25 MCG/ACT AEPB INHALE 1 PUFF BY MOUTH EVERY DAY   guaiFENesin -dextromethorphan  (ROBITUSSIN DM) 100-10 MG/5ML syrup Take 5 mLs by mouth every 4 (four) hours as needed for cough.   ipratropium-albuterol  (DUONEB) 0.5-2.5 (3) MG/3ML SOLN INHALE 3 ML BY NEBULIZER EVERY 6 HOURS AS NEEDED   isosorbide  mononitrate (IMDUR ) 60 MG 24 hr tablet TAKE 1.5  TABLETS (90 MG TOTAL) BY MOUTH DAILY.   KLOR-CON  M10 10 MEQ tablet TAKE 1 TABLET BY MOUTH EVERY DAY   MAGNESIUM  OXIDE PO Take 1 tablet by mouth at bedtime.   metFORMIN  (GLUCOPHAGE -XR) 500 MG 24 hr tablet TAKE 2 TABLET BY MOUTH EVERY DAY WITH BREAKFAST   Multiple Vitamins-Minerals (MULTIVITAMIN WOMEN 50+) TABS Take 1 tablet by mouth in the morning.   OXYGEN  Inhale 3 L into the lungs continuous.   predniSONE  (DELTASONE ) 10 MG tablet Take 6 tablets (60 mg) daily for 2 days, then, Take 5 tablets (50 mg) daily for 2 days, then, Take 4 tablets (40 mg) daily for 2 days, then, Take 3 tablets (30 mg) daily for 2 days, then Take 2 tablets (20mg ) daily for 2 days then start your home dose of once a day   predniSONE  (DELTASONE ) 10 MG tablet Take 1 tablet (10 mg total) by mouth daily with breakfast. After completing taper   torsemide  (DEMADEX ) 20 MG tablet Take 1 tablet (20 mg total) by mouth daily.   insulin  aspart (NOVOLOG  FLEXPEN) 100 UNIT/ML FlexPen Three times daily for meals. CBG 70 - 120: 0 units CBG 121 - 150: 3 units CBG 151 - 200: 4 units CBG 201 - 250: 7 units CBG 251 - 300: 11 units CBG 301 - 350: 15 units CBG 351 - 400: 20 units (Patient not taking: Reported on 06/16/2024)   No facility-administered encounter medications on file as of 06/16/2024.    Allergies (verified) Chantix  [varenicline ] and Augmentin  [amoxicillin -pot clavulanate]   History: Past Medical History:  Diagnosis Date   Bronchitis    CAD (coronary artery disease)    a. 02/2002: CABG x3 with LIMA to LAD, RIMA to RCA, and SVG to OM   CHF (congestive heart failure) (HCC)    COPD (chronic obstructive pulmonary disease) (HCC)    Diabetes (HCC)    Headache    Heart disease    Hyperlipidemia    Hypertension    Hypertensive emergency 07/01/2013   Impaired glucose tolerance 08/20/2014   Sinusitis    Past Surgical History:  Procedure Laterality Date   CORONARY ARTERY BYPASS GRAFT  2003   Triple bypass    ESOPHAGOGASTRODUODENOSCOPY Left 06/13/2014   Procedure: ESOPHAGOGASTRODUODENOSCOPY (EGD);  Surgeon: Renaye Sous, MD;  Location[facility] ENDOSCOPY;  Service: Endoscopy;  Laterality: Left;   RIGID ESOPHAGOSCOPY N/A 06/12/2014   Procedure: RIGID ESOPHAGOSCOPY WITH FOREIGN BODY REMOVAL;  Surgeon: Ana LELON Moccasin, MD;  Location: [facility] OR;  Service: ENT;  Laterality: N/A;   Family History  Problem Relation Age of Onset   Lung cancer Paternal Uncle    Heart disease Mother    Brain cancer Mother    Cancer Maternal Grandmother        colon   Stroke Other    Cancer Other        x 4 aunts   Lung cancer Maternal Aunt    Suicidality Brother    Social History   Socioeconomic History   Marital status: Single    Spouse  name: Not on file   Number of children: Not on file   Years of education: Not on file   Highest education level: Some college, no degree  Occupational History   Not on file  Tobacco Use   Smoking status: Former    Current packs/day: 0.00    Average packs/day: 0.5 packs/day for 47.4 years (23.7 ttl pk-yrs)    Types: Cigarettes    Start date: 12/29/1971    Quit date: 05/22/2019    Years since quitting: 5.0   Smokeless tobacco: Never   Tobacco comments:    Smoking daily 1-2 day.  Stops for a while and then goes back.  08/29/2022 hfb  Substance and Sexual Activity   Alcohol use: Not Currently   Drug use: Not Currently    Types: Marijuana    Comment: as a teenager   Sexual activity: Not Currently    Birth control/protection: None  Other Topics Concern   Not on file  Social History Narrative   Not on file   Social Drivers of Health   Financial Resource Strain: Low Risk  (06/16/2024)   Overall Financial Resource Strain (CARDIA)    Difficulty of Paying Living Expenses: Not hard at all  Food Insecurity: No Food Insecurity (06/16/2024)   Hunger Vital Sign    Worried About Running Out of Food in the Last Year: Never true    Ran Out of Food in the Last Year: Never true  Transportation  Needs: No Transportation Needs (06/16/2024)   PRAPARE - Administrator, Civil Service (Medical): No    Lack of Transportation (Non-Medical): No  Physical Activity: Insufficiently Active (06/16/2024)   Exercise Vital Sign    Days of Exercise per Week: 5 days    Minutes of Exercise per Session: 20 min  Stress: No Stress Concern Present (06/16/2024)   [employer] of Occupational Health - Occupational Stress Questionnaire    Feeling of Stress: Not at all  Social Connections: Moderately Integrated (06/16/2024)   Social Connection and Isolation Panel    Frequency of Communication with Friends and Family: More than three times a week    Frequency of Social Gatherings with Friends and Family: Three times a week    Attends Religious Services: More than 4 times per year    Active Member of Clubs or Organizations: Yes    Attends Banker Meetings: Never    Marital Status: Never married    Tobacco Counseling Counseling given: Yes Tobacco comments: Smoking daily 1-2 day.  Stops for a while and then goes back.  08/29/2022 hfb    Clinical Intake:  Pre-visit preparation completed: Yes  Pain : No/denies pain     BMI - recorded: 35.71 Nutritional Status: BMI > 30  Obese Nutritional Risks: None Diabetes: Yes CBG done?: Yes CBG resulted in Enter/ Edit results?: Yes (fasting - 130) Did pt. bring in CBG monitor from home?: No  Lab Results  Component Value Date   HGBA1C 7.4 (H) 03/11/2024   HGBA1C 7.7 (H) 08/29/2023   HGBA1C 7.4 (H) 04/17/2023     How often do you need to have someone help you when you read instructions, pamphlets, or other written materials from your doctor or pharmacy?: 1 - Never  Interpreter Needed?: No  Information entered by :: [name], CMA   Activities of Daily Living     06/16/2024    1:57 PM 03/19/2024   12:16 AM  In your present state of health, do you have  any difficulty performing the following activities:  Hearing? 0 0   Vision? 0 0  Difficulty concentrating or making decisions? 0 0  Walking or climbing stairs? 0   Dressing or bathing? 0   Doing errands, shopping? 0 1  Preparing Food and eating ? N   Using the Toilet? N   In the past six months, have you accidently leaked urine? N   Do you have problems with loss of bowel control? N   Managing your Medications? N   Managing your Finances? N   Housekeeping or managing your Housekeeping? N     Patient Care Team: Norleen Lynwood ORN, MD as PCP - General (Internal Medicine) Jeffrie Oneil BROCKS, MD as PCP - Cardiology (Cardiology) Octavia Bruckner, MD as Consulting Physician (Ophthalmology) Wallace, Juana M, RN as VBCI Care Management  I have updated your Care Teams any recent Medical Services you may have received from other providers in the past year.     Assessment:   This is a routine wellness examination for [name].  Hearing/Vision screen Hearing Screening - Comments:: Denies hearing difficulties   Vision Screening - Comments:: Denies vision concerns - up-to-date w/Dr [place] Octavia   Goals Addressed               This Visit's Progress     Patient Stated (pt-stated)        Patient stated she plans to continue exercising and watching her diet       Depression Screen     06/16/2024    1:58 PM 05/28/2024   10:25 AM 04/20/2024   11:10 AM 04/01/2024   10:26 AM 03/26/2024   10:58 AM 03/11/2024    9:56 AM 12/17/2023    2:30 PM  PHQ 2/9 Scores  PHQ - 2 Score 0 0 0 0 0 0 0  PHQ- 9 Score 0 0         Fall Risk     06/16/2024    1:57 PM 06/15/2024    8:00 AM 06/10/2024    8:26 AM 06/08/2024    8:00 AM 06/03/2024    8:19 AM  Fall Risk   Falls in the past year? 0 0 0 0 0  Number falls in past yr: 0 0 0 0 0  Injury with Fall? 0 0 0 0 0  Risk for fall due to : No Fall Risks No Fall Risks No Fall Risks No Fall Risks No Fall Risks  Follow up Falls evaluation completed;Falls prevention discussed Falls evaluation completed Falls evaluation completed Falls  evaluation completed Falls evaluation completed;Falls prevention discussed    MEDICARE RISK AT HOME:  Medicare Risk at Home Any stairs in or around the home?: No If so, are there any without handrails?: No Home free of loose throw rugs in walkways, pet beds, electrical cords, etc?: Yes Adequate lighting in your home to reduce risk of falls?: Yes Life alert?: Yes Use of a cane, walker or w/c?: No Grab bars in the bathroom?: Yes Shower chair or bench in shower?: Yes Elevated toilet seat or a handicapped toilet?: Yes  TIMED UP AND GO:  Was the test performed?  No  Cognitive Function: 6CIT completed        06/16/2024    2:00 PM 06/16/2023   11:21 AM 06/20/2022    1:51 PM  6CIT Screen  What Year? 0 points 0 points 0 points  What month? 0 points 0 points 0 points  What time? 0 points 0 points 0 points  Count back from 20 0 points 0 points 0 points  Months in reverse 0 points 0 points 0 points  Repeat phrase 0 points 0 points 0 points  Total Score 0 points 0 points 0 points    Immunizations Immunization History  Administered Date(s) Administered   Fluad Quad(high Dose 65+) 07/10/2022   Fluad Trivalent(High Dose 65+) 08/29/2023   Influenza Split 07/08/2013   Influenza,inj,Quad PF,6+ Mos 07/31/2015, 09/03/2017, 10/04/2021   PFIZER Comirnaty(Gray Top)Covid-19 Tri-Sucrose Vaccine 08/31/2020   PFIZER(Purple Top)SARS-COV-2 Vaccination 01/01/2020, 01/25/2020   PNEUMOCOCCAL CONJUGATE-20 11/14/2022   Pneumococcal Polysaccharide-23 02/02/2014, 05/03/2019   Zoster Recombinant(Shingrix) 01/21/2024    Screening Tests Health Maintenance  Topic Date Due   DTaP/Tdap/Td (1 - Tdap) Never done   Zoster Vaccines- Shingrix (2 of 2) 03/17/2024   INFLUENZA VACCINE  05/21/2024   Colonoscopy  09/02/2024   HEMOGLOBIN A1C  09/11/2024   Lung Cancer Screening  12/07/2024   OPHTHALMOLOGY EXAM  12/30/2024   Diabetic kidney evaluation - Urine ACR  03/11/2025   FOOT EXAM  03/11/2025   Diabetic  kidney evaluation - eGFR measurement  03/23/2025   Medicare Annual Wellness (AWV)  06/16/2025   MAMMOGRAM  06/17/2025   Pneumococcal Vaccine: 50+ Years  Completed   DEXA SCAN  Completed   Hepatitis C Screening  Completed   HPV VACCINES  Aged Out   Meningococcal B Vaccine  Aged Out   COVID-19 Vaccine  Discontinued    Health Maintenance  Health Maintenance Due  Topic Date Due   DTaP/Tdap/Td (1 - Tdap) Never done   Zoster Vaccines- Shingrix (2 of 2) 03/17/2024   INFLUENZA VACCINE  05/21/2024   Colonoscopy  09/02/2024   Health Maintenance Items Addressed:  Colonoscopy status: Scheduled w/Dr Kristie for 07/2024  Additional Screening:  Vision Screening: Recommended annual ophthalmology exams for early detection of glaucoma and other disorders of the eye. Would you like a referral to an eye doctor? No    Dental Screening: Recommended annual dental exams for proper oral hygiene  Community Resource Referral / Chronic Care Management: CRR required this visit?  No   CCM required this visit?  No   Plan:    I have personally reviewed and noted the following in the patient's chart:   Medical and social history Use of alcohol, tobacco or illicit drugs  Current medications and supplements including opioid prescriptions. Patient is not currently taking opioid prescriptions. Functional ability and status Nutritional status Physical activity Advanced directives List of other physicians Hospitalizations, surgeries, and ER visits in previous 12 months Vitals Screenings to include cognitive, depression, and falls Referrals and appointments  In addition, I have reviewed and discussed with patient certain preventive protocols, quality metrics, and best practice recommendations. A written personalized care plan for preventive services as well as general preventive health recommendations were provided to patient.   [name], CMA   06/16/2024   After Visit Summary: (MyChart) Due  to this being a telephonic visit, the after visit summary with patients personalized plan was offered to patient via MyChart   Notes: Scheduled a 3-mth Diabetes f/u w/PCP for 07/16/2024.

## 2024-06-17 ENCOUNTER — Encounter (HOSPITAL_COMMUNITY)
Admission: RE | Admit: 2024-06-17 | Discharge: 2024-06-17 | Disposition: A | Discharge status: Home / Self Care | Source: Ambulatory Visit | Attending: Pulmonary Disease

## 2024-06-17 VITALS — Wt 190.9 lb

## 2024-06-17 DIAGNOSIS — J449 Chronic obstructive pulmonary disease, unspecified: Secondary | ICD-10-CM | POA: Diagnosis not present

## 2024-06-17 NOTE — Progress Notes (Signed)
 Daily Session Note  Patient Details  Name: [name] MRN: [id number] Date of Birth: 04/29/1956 Referring Provider:   Conrad Ports Pulmonary Rehab Walk Test from 05/28/2024 in [facility]  Referring Provider [name]    Encounter Date: 06/17/2024  Check In:  Session Check In - 06/17/24 0809       Check-In   Supervising physician immediately available to respond to emergencies [facility] MD immediately available    Physician(s) Jackee Alberts, NP    Location [facility]    Staff Present Johnnie Moats, MS, ACSM-CEP, Exercise Physiologist;Jazzalynn Rhudy Claudene Candia Levin, RN, BSN;Randi Reeve BS, ACSM-CEP, Exercise Physiologist    Virtual Visit No    Medication changes reported     No    Fall or balance concerns reported    No    Tobacco Cessation No Change    Warm-up and Cool-down Performed as group-led instruction    Resistance Training Performed Yes    VAD Patient? No    PAD/SET Patient? No      Pain Assessment   Currently in Pain? No/denies    Multiple Pain Sites No          Capillary Blood Glucose: No results found for this or any previous visit (from the past 24 hours).    Social History   Tobacco Use  Smoking Status Former   Current packs/day: 0.00   Average packs/day: 0.5 packs/day for 47.4 years (23.7 ttl pk-yrs)   Types: Cigarettes   Start date: 12/29/1971   Quit date: 05/22/2019   Years since quitting: 5.0  Smokeless Tobacco Never  Tobacco Comments   Smoking daily 1-2 day.  Stops for a while and then goes back.  08/29/2022 hfb    Goals Met:  Proper associated with RPD/PD & O2 Sat Independence with exercise equipment Exercise tolerated well No report of concerns or symptoms today Strength training completed today  Goals Unmet:  Not Applicable  Comments: Service time is from 0806 to 0923.    Dr. Slater Staff is Medical Director for Pulmonary Rehab at [facility].

## 2024-06-18 ENCOUNTER — Telehealth: Payer: Self-pay

## 2024-06-18 DIAGNOSIS — J9610 Chronic respiratory failure, unspecified whether with hypoxia or hypercapnia: Secondary | ICD-10-CM | POA: Diagnosis not present

## 2024-06-18 NOTE — Patient Instructions (Signed)
 Kyra CHRISTELLA Finder - I am sorry I was unable to reach you today for our scheduled appointment. I work with Norleen Lynwood ORN, MD and am calling to support your healthcare needs. Please contact me at [phone] at your earliest convenience. I look forward to speaking with you soon.   Thank you,   Heddy Shutter, RN, MSN, BSN, CCM [place]  [facility], Population Health Case Manager Phone: [phone]

## 2024-06-20 ENCOUNTER — Other Ambulatory Visit (HOSPITAL_BASED_OUTPATIENT_CLINIC_OR_DEPARTMENT_OTHER): Payer: Self-pay | Admitting: Pulmonary Disease

## 2024-06-22 ENCOUNTER — Encounter (HOSPITAL_COMMUNITY)

## 2024-06-22 ENCOUNTER — Encounter: Payer: Self-pay | Admitting: Internal Medicine

## 2024-06-22 ENCOUNTER — Ambulatory Visit
Admission: RE | Admit: 2024-06-22 | Discharge: 2024-06-22 | Disposition: A | Discharge status: Home / Self Care | Source: Ambulatory Visit | Attending: Internal Medicine | Admitting: Internal Medicine

## 2024-06-22 DIAGNOSIS — Z1231 Encounter for screening mammogram for malignant neoplasm of breast: Secondary | ICD-10-CM

## 2024-06-23 DIAGNOSIS — J449 Chronic obstructive pulmonary disease, unspecified: Secondary | ICD-10-CM | POA: Diagnosis not present

## 2024-06-23 NOTE — Telephone Encounter (Signed)
 Ok for in person or televisit if she likes,  thanks

## 2024-06-23 NOTE — Progress Notes (Signed)
 Pulmonary Individual Treatment Plan  Patient Details  Name: [name] MRN: [id number] Date of Birth: 1956-03-05 Referring Provider:   Conrad Ports Pulmonary Rehab Walk Test from 05/28/2024 in [facility]  Referring Provider [name]    Initial Encounter Date:  Flowsheet Row Pulmonary Rehab Walk Test from 05/28/2024 in [facility]  Date 05/28/24    Visit Diagnosis: Stage 4 very severe COPD by GOLD classification (HCC)  Patient's Home Medications on Admission:   Current Outpatient Medications:    acetaminophen  (TYLENOL ) 500 MG tablet, Take 1,000 mg by mouth 2 (two) times daily as needed for headache, fever, moderate pain or mild pain., Disp: , Rfl:    albuterol  (VENTOLIN  HFA) 108 (90 Base) MCG/ACT inhaler, INHALE 1-2 PUFFS BY MOUTH EVERY 6 HOURS AS NEEDED FOR WHEEZE OR SHORTNESS OF BREATH, Disp: 18 each, Rfl: 5   alendronate  (FOSAMAX ) 70 MG tablet, TAKE 1 TABLET (70 MG TOTAL) BY MOUTH EVERY 7 DAYS WITH FULL GLASS WATER ON EMPTY STOMACH, Disp: 12 tablet, Rfl: 3   amLODipine  (NORVASC ) 5 MG tablet, TAKE 1 TABLET (5 MG TOTAL) BY MOUTH DAILY., Disp: 90 tablet, Rfl: 0   atorvastatin  (LIPITOR) 40 MG tablet, TAKE 1 TABLET BY MOUTH EVERY DAY, Disp: 90 tablet, Rfl: 3   bisoprolol  (ZEBETA ) 5 MG tablet, TAKE 1 TABLET (5 MG TOTAL) BY MOUTH DAILY., Disp: 90 tablet, Rfl: 2   Cholecalciferol  (VITAMIN D -3 PO), Take 1 capsule by mouth in the morning., Disp: , Rfl:    Cyanocobalamin  (VITAMIN B-12 PO), Take 1 tablet by mouth in the morning., Disp: , Rfl:    Dulaglutide  (TRULICITY ) 3 MG/0.5ML SOAJ, Inject 3 mg as directed once a week., Disp: 6 mL, Rfl: 3   fluticasone  (FLONASE ) 50 MCG/ACT nasal spray, Place 1 spray into both nostrils daily., Disp: 15.8 mL, Rfl: 0   guaiFENesin -dextromethorphan  (ROBITUSSIN DM) 100-10 MG/5ML syrup, Take 5 mLs by mouth every 4 (four) hours as needed for cough.,  Disp: 118 mL, Rfl: 0   insulin  aspart (NOVOLOG  FLEXPEN) 100 UNIT/ML FlexPen, Three times daily for meals. CBG 70 - 120: 0 units CBG 121 - 150: 3 units CBG 151 - 200: 4 units CBG 201 - 250: 7 units CBG 251 - 300: 11 units CBG 301 - 350: 15 units CBG 351 - 400: 20 units (Patient not taking: Reported on 06/16/2024), Disp: 15 mL, Rfl: 0   ipratropium-albuterol  (DUONEB) 0.5-2.5 (3) MG/3ML SOLN, INHALE 3 ML BY NEBULIZER EVERY 6 HOURS AS NEEDED, Disp: 360 mL, Rfl: 11   isosorbide  mononitrate (IMDUR ) 60 MG 24 hr tablet, TAKE 1.5 TABLETS (90 MG TOTAL) BY MOUTH DAILY., Disp: 135 tablet, Rfl: 3   KLOR-CON  M10 10 MEQ tablet, TAKE 1 TABLET BY MOUTH EVERY DAY, Disp: 90 tablet, Rfl: 1   MAGNESIUM  OXIDE PO, Take 1 tablet by mouth at bedtime., Disp: , Rfl:    metFORMIN  (GLUCOPHAGE -XR) 500 MG 24 hr tablet, TAKE 2 TABLET BY MOUTH EVERY DAY WITH BREAKFAST, Disp: 180 tablet, Rfl: 3   Multiple Vitamins-Minerals (MULTIVITAMIN WOMEN 50+) TABS, Take 1 tablet by mouth in the morning., Disp: , Rfl:    OXYGEN , Inhale 3 L into the lungs continuous., Disp: , Rfl:    predniSONE  (DELTASONE ) 10 MG tablet, Take 6 tablets (60 mg) daily for 2 days, then, Take 5 tablets (50 mg) daily for 2 days, then, Take 4 tablets (40 mg) daily for 2 days, then,  Take 3 tablets (30 mg) daily for 2 days, then Take 2 tablets (20mg ) daily for 2 days then start your home dose of once a day, Disp: 40 tablet, Rfl: 0   predniSONE  (DELTASONE ) 10 MG tablet, Take 1 tablet (10 mg total) by mouth daily with breakfast. After completing taper, Disp: 90 tablet, Rfl: 1   torsemide  (DEMADEX ) 20 MG tablet, Take 1 tablet (20 mg total) by mouth daily., Disp: 30 tablet, Rfl: 2   TRELEGY ELLIPTA  200-62.5-25 MCG/ACT AEPB, INHALE 1 PUFF BY MOUTH EVERY DAY, Disp: 60 each, Rfl: 8  Past Medical History: Past Medical History:  Diagnosis Date   Bronchitis    CAD (coronary artery disease)    a. 02/2002: CABG x3 with LIMA to LAD, RIMA to RCA, and SVG to OM   CHF (congestive heart  failure) (HCC)    COPD (chronic obstructive pulmonary disease) (HCC)    Diabetes (HCC)    Headache    Heart disease    Hyperlipidemia    Hypertension    Hypertensive emergency 07/01/2013   Impaired glucose tolerance 08/20/2014   Sinusitis     Tobacco Use: Social History   Tobacco Use  Smoking Status Former   Current packs/day: 0.00   Average packs/day: 0.5 packs/day for 47.4 years (23.7 ttl pk-yrs)   Types: Cigarettes   Start date: 12/29/1971   Quit date: 05/22/2019   Years since quitting: 5.0  Smokeless Tobacco Never  Tobacco Comments   Smoking daily 1-2 day.  Stops for a while and then goes back.  08/29/2022 hfb    Labs: Review Flowsheet  More data exists      Latest Ref Rng & Units 03/15/2024 03/16/2024 03/18/2024 03/19/2024 03/23/2024  Labs for ITP Cardiac and Pulmonary Rehab  Bicarbonate 20.0 - 28.0 mmol/L 41.5  45.9  47.6  53.1  55.6   TCO2 22 - 32 mmol/L - - >50  - -  O2 Saturation % 89.7  99.7  71  91.6  86.8     Capillary Blood Glucose: Lab Results  Component Value Date   GLUCAP 121 (H) 06/10/2024   GLUCAP 143 (H) 06/10/2024   GLUCAP 121 (H) 06/08/2024   GLUCAP 133 (H) 06/08/2024   GLUCAP 126 (H) 06/03/2024    POCT Glucose     Row Name 06/01/24 0924 06/03/24 0924 06/08/24 0924         POCT Blood Glucose   Pre-Exercise 216 mg/dL 793 mg/dL 866 mg/dL     Post-Exercise 898 mg/dL 873 mg/dL 878 mg/dL        Pulmonary Assessment Scores:  Pulmonary Assessment Scores     Row Name 05/28/24 0934         ADL UCSD   ADL Phase Entry     SOB Score total 20       CAT Score   CAT Score 4       mMRC Score   mMRC Score 1       UCSD: Self-administered rating of dyspnea associated with activities of daily living (ADLs) 6-point scale (0 = not at all to 5 = maximal or unable to do because of breathlessness)  Scoring Scores range from 0 to 120.  Minimally important difference is 5 units  CAT: CAT can identify the health impairment of COPD patients and is  better correlated with disease progression.  CAT has a scoring range of zero to 40. The CAT score is classified into four groups of low (less than 10), medium (10 -  20), high (21-30) and very high (31-40) based on the impact level of disease on health status. A CAT score over 10 suggests significant symptoms.  A worsening CAT score could be explained by an exacerbation, poor medication adherence, poor inhaler technique, or progression of COPD or comorbid conditions.  CAT MCID is 2 points  mMRC: mMRC (Modified Medical Research Council) Dyspnea Scale is used to assess the degree of baseline functional disability in patients of respiratory disease due to dyspnea. No minimal important difference is established. A decrease in score of 1 point or greater is considered a positive change.   Pulmonary Function Assessment:  Pulmonary Function Assessment - 05/28/24 1209       Breath   Bilateral Breath Sounds Rhonchi;Decreased          Exercise Target Goals: Exercise Program Goal: Individual exercise prescription set using results from initial 6 min walk test and THRR while considering  patient's activity barriers and safety.   Exercise Prescription Goal: Initial exercise prescription builds to 30-45 minutes a day of aerobic activity, 2-3 days per week.  Home exercise guidelines will be given to patient during program as part of exercise prescription that the participant will acknowledge.  Activity Barriers & Risk Stratification:  Activity Barriers & Cardiac Risk Stratification - 05/28/24 0934       Activity Barriers & Cardiac Risk Stratification   Activity Barriers Deconditioning;Muscular Weakness;Shortness of Breath          6 Minute Walk:  6 Minute Walk     Row Name 05/28/24 1013         6 Minute Walk   Phase Initial     Distance 1110 feet     Walk Time 6 minutes     # of Rest Breaks 0     MPH 2.1     METS 2.2     RPE 11     Perceived Dyspnea  1     VO2 Peak 7.7      Symptoms No     Resting HR 70 bpm     Resting BP 108/62     Resting Oxygen  Saturation  99 %     Exercise Oxygen  Saturation  during 6 min walk 94 %     Max Ex. HR 108 bpm     Max Ex. BP 126/60     2 Minute Post BP 110/60       Interval HR   1 Minute HR 91     2 Minute HR 91     3 Minute HR 90     4 Minute HR 108     5 Minute HR 107     6 Minute HR 104     2 Minute Post HR 70     Interval Heart Rate? Yes       Interval Oxygen    Interval Oxygen ? Yes     Baseline Oxygen  Saturation % 99 %     1 Minute Oxygen  Saturation % 100 %     1 Minute Liters of Oxygen  2 L     2 Minute Oxygen  Saturation % 99 %     2 Minute Liters of Oxygen  2 L     3 Minute Oxygen  Saturation % 94 %     3 Minute Liters of Oxygen  2 L     4 Minute Oxygen  Saturation % 96 %     4 Minute Liters of Oxygen  2 L     5 Minute Oxygen  Saturation %  97 %     5 Minute Liters of Oxygen  2 L     6 Minute Oxygen  Saturation % 96 %     6 Minute Liters of Oxygen  2 L     2 Minute Post Oxygen  Saturation % 99 %     2 Minute Post Liters of Oxygen  2 L        Oxygen  Initial Assessment:  Oxygen  Initial Assessment - 05/28/24 0932       Home Oxygen    Home Oxygen  Device Portable Concentrator;E-Tanks;Home Concentrator    Sleep Oxygen  Prescription Continuous    Liters per minute 3    Home Exercise Oxygen  Prescription Continuous    Liters per minute 3    Home Resting Oxygen  Prescription Continuous    Liters per minute 3    Compliance with Home Oxygen  Use Yes      Initial 6 min Walk   Oxygen  Used Continuous    Liters per minute 3      Program Oxygen  Prescription   Program Oxygen  Prescription Continuous    Liters per minute 3      Intervention   Short Term Goals To learn and exhibit compliance with exercise, home and travel O2 prescription;To learn and understand importance of maintaining oxygen  saturations>88%;To learn and demonstrate proper use of respiratory medications;To learn and understand importance of monitoring SPO2  with pulse oximeter and demonstrate accurate use of the pulse oximeter.;To learn and demonstrate proper pursed lip breathing techniques or other breathing techniques.     Long  Term Goals Exhibits compliance with exercise, home  and travel O2 prescription;Verbalizes importance of monitoring SPO2 with pulse oximeter and return demonstration;Maintenance of O2 saturations>88%;Exhibits proper breathing techniques, such as pursed lip breathing or other method taught during program session;Compliance with respiratory medication;Demonstrates proper use of MDI's          Oxygen  Re-Evaluation:  Oxygen  Re-Evaluation     Row Name 06/15/24 1625             Program Oxygen  Prescription   Program Oxygen  Prescription Continuous       Liters per minute 3         Home Oxygen    Home Oxygen  Device Portable Concentrator;E-Tanks;Home Concentrator       Sleep Oxygen  Prescription Continuous       Liters per minute 3       Home Exercise Oxygen  Prescription Continuous       Liters per minute 3       Home Resting Oxygen  Prescription Continuous       Liters per minute 3       Compliance with Home Oxygen  Use Yes         Goals/Expected Outcomes   Short Term Goals To learn and exhibit compliance with exercise, home and travel O2 prescription;To learn and understand importance of maintaining oxygen  saturations>88%;To learn and demonstrate proper use of respiratory medications;To learn and understand importance of monitoring SPO2 with pulse oximeter and demonstrate accurate use of the pulse oximeter.;To learn and demonstrate proper pursed lip breathing techniques or other breathing techniques.        Long  Term Goals Exhibits compliance with exercise, home  and travel O2 prescription;Verbalizes importance of monitoring SPO2 with pulse oximeter and return demonstration;Maintenance of O2 saturations>88%;Exhibits proper breathing techniques, such as pursed lip breathing or other method taught during program  session;Compliance with respiratory medication;Demonstrates proper use of MDI's       Goals/Expected Outcomes Compliance and understanding of oxygen  saturation monitoring  and breathing techniques to decrease shortness of breath.          Oxygen  Discharge (Final Oxygen  Re-Evaluation):  Oxygen  Re-Evaluation - 06/15/24 1625       Program Oxygen  Prescription   Program Oxygen  Prescription Continuous    Liters per minute 3      Home Oxygen    Home Oxygen  Device Portable Concentrator;E-Tanks;Home Concentrator    Sleep Oxygen  Prescription Continuous    Liters per minute 3    Home Exercise Oxygen  Prescription Continuous    Liters per minute 3    Home Resting Oxygen  Prescription Continuous    Liters per minute 3    Compliance with Home Oxygen  Use Yes      Goals/Expected Outcomes   Short Term Goals To learn and exhibit compliance with exercise, home and travel O2 prescription;To learn and understand importance of maintaining oxygen  saturations>88%;To learn and demonstrate proper use of respiratory medications;To learn and understand importance of monitoring SPO2 with pulse oximeter and demonstrate accurate use of the pulse oximeter.;To learn and demonstrate proper pursed lip breathing techniques or other breathing techniques.     Long  Term Goals Exhibits compliance with exercise, home  and travel O2 prescription;Verbalizes importance of monitoring SPO2 with pulse oximeter and return demonstration;Maintenance of O2 saturations>88%;Exhibits proper breathing techniques, such as pursed lip breathing or other method taught during program session;Compliance with respiratory medication;Demonstrates proper use of MDI's    Goals/Expected Outcomes Compliance and understanding of oxygen  saturation monitoring and breathing techniques to decrease shortness of breath.          Initial Exercise Prescription:  Initial Exercise Prescription - 05/28/24 1000       Date of Initial Exercise RX and Referring  Provider   Date 05/28/24    Referring Provider [name]    Expected Discharge Date 08/24/24      Oxygen    Oxygen  Continuous    Liters 3    Maintain Oxygen  Saturation 88% or higher      Treadmill   MPH 1.7    Grade 1    Minutes 15    METs 2.3      Recumbant Elliptical   Level 2    RPM 50    Watts 60    Minutes 15    METs 2      Prescription Details   Frequency (times per week) 2    Duration Progress to 30 minutes of continuous aerobic without signs/symptoms of physical distress      Intensity   THRR 40-80% of Max Heartrate 61-122    Ratings of Perceived Exertion 11-13    Perceived Dyspnea 0-4      Progression   Progression Continue to progress workloads to maintain intensity without signs/symptoms of physical distress.      Resistance Training   Training Prescription Yes    Weight blue bands    Reps 10-15          Perform Capillary Blood Glucose checks as needed.  Exercise Prescription Changes:   Exercise Prescription Changes     Row Name 06/08/24 0800 06/22/24 0900           Response to Exercise   Blood Pressure (Admit) 108/54 118/64      Blood Pressure (Exercise) 140/68 --      Blood Pressure (Exit) 100/56 106/60      Heart Rate (Admit) 73 bpm 79 bpm      Heart Rate (Exercise) 94 bpm 108 bpm      Heart Rate (  Exit) 84 bpm 77 bpm      Oxygen  Saturation (Admit) 100 %  3L 99 %      Oxygen  Saturation (Exercise) 99 %  3L 95 %      Oxygen  Saturation (Exit) 98 %  3L 98 %      Rating of Perceived Exertion (Exercise) 11 12      Perceived Dyspnea (Exercise) 1 1      Duration Continue with 30 min of aerobic exercise without signs/symptoms of physical distress. Continue with 30 min of aerobic exercise without signs/symptoms of physical distress.      Intensity THRR unchanged THRR unchanged        Progression   Progression -- Continue to progress workloads to maintain intensity without signs/symptoms of physical distress.        Resistance Training   Training  Prescription Yes Yes      Weight blue bands blue bands      Reps 10-15 10-15      Time 10 Minutes 10 Minutes        Interval Training   Interval Training No --        Oxygen    Oxygen  Continuous Continuous      Liters 3 3        Treadmill   MPH 2.5 2.5      Grade 1 2      Minutes 15 15      METs 3.1 3.4        Recumbant Elliptical   Level 2 3      RPM -- 54      Watts 72 70      Minutes 15 15      METs 3.1 3        Oxygen    Maintain Oxygen  Saturation 88% or higher 88% or higher         Exercise Comments:   Exercise Goals and Review:   Exercise Goals     Row Name 05/28/24 0935             Exercise Goals   Increase Physical Activity Yes       Intervention Provide advice, education, support and counseling about physical activity/exercise needs.;Develop an individualized exercise prescription for aerobic and resistive training based on initial evaluation findings, risk stratification, comorbidities and participant's personal goals.       Expected Outcomes Short Term: Attend rehab on a regular basis to increase amount of physical activity.;Long Term: Add in home exercise to make exercise part of routine and to increase amount of physical activity.;Long Term: Exercising regularly at least 3-5 days a week.       Increase Strength and Stamina Yes       Intervention Provide advice, education, support and counseling about physical activity/exercise needs.;Develop an individualized exercise prescription for aerobic and resistive training based on initial evaluation findings, risk stratification, comorbidities and participant's personal goals.       Expected Outcomes Short Term: Increase workloads from initial exercise prescription for resistance, speed, and METs.;Short Term: Perform resistance training exercises routinely during rehab and add in resistance training at home;Long Term: Improve cardiorespiratory fitness, muscular endurance and strength as measured by increased METs  and functional capacity ( )       Able to understand and use rate of perceived exertion (RPE) scale Yes       Intervention Provide education and explanation on how to use RPE scale       Expected Outcomes Short Term: Able to  use RPE daily in rehab to express subjective intensity level;Long Term:  Able to use RPE to guide intensity level when exercising independently       Able to understand and use Dyspnea scale Yes       Intervention Provide education and explanation on how to use Dyspnea scale       Expected Outcomes Short Term: Able to use Dyspnea scale daily in rehab to express subjective sense of shortness of breath during exertion;Long Term: Able to use Dyspnea scale to guide intensity level when exercising independently       Knowledge and understanding of Target Heart Rate Range (THRR) Yes       Intervention Provide education and explanation of THRR including how the numbers were predicted and where they are located for reference       Expected Outcomes Short Term: Able to state/look up THRR;Long Term: Able to use THRR to govern intensity when exercising independently;Short Term: Able to use daily as guideline for intensity in rehab       Understanding of Exercise Prescription Yes       Intervention Provide education, explanation, and written materials on patient's individual exercise prescription       Expected Outcomes Short Term: Able to explain program exercise prescription;Long Term: Able to explain home exercise prescription to exercise independently          Exercise Goals Re-Evaluation :  Exercise Goals Re-Evaluation     Row Name 06/15/24 1625             Exercise Goal Re-Evaluation   Exercise Goals Review Increase Physical Activity;Able to understand and use Dyspnea scale;Understanding of Exercise Prescription;Increase Strength and Stamina;Knowledge and understanding of Target Heart Rate Range (THRR);Able to understand and use rate of perceived exertion (RPE) scale        Comments [name] has completed 5 exercise sessions. She exercises for 15 min on the Octane and treadmill. She averages 2.8 METs at level 2/3 on the Octane and 3.89 METs at 2.6 mph and 2.5% incline on the treadmill. Pt performs the warmup and cooldown standing without limitations. [name] has increased her level on the Octane and speed and incline on the treadmill. She tolerates progressions well. Pt seems very motivated to exercise. Will continue to monitor and progress as able.       Expected Outcomes Through exercise at rehab and home, the patient will decrease shortness of breath with daily activities and feel confident in carrying out an exercise regimen at home.          Discharge Exercise Prescription (Final Exercise Prescription Changes):  Exercise Prescription Changes - 06/22/24 0900       Response to Exercise   Blood Pressure (Admit) 118/64    Blood Pressure (Exit) 106/60    Heart Rate (Admit) 79 bpm    Heart Rate (Exercise) 108 bpm    Heart Rate (Exit) 77 bpm    Oxygen  Saturation (Admit) 99 %    Oxygen  Saturation (Exercise) 95 %    Oxygen  Saturation (Exit) 98 %    Rating of Perceived Exertion (Exercise) 12    Perceived Dyspnea (Exercise) 1    Duration Continue with 30 min of aerobic exercise without signs/symptoms of physical distress.    Intensity THRR unchanged      Progression   Progression Continue to progress workloads to maintain intensity without signs/symptoms of physical distress.      Resistance Training   Training Prescription Yes    Weight blue bands  Reps 10-15    Time 10 Minutes      Oxygen    Oxygen  Continuous    Liters 3      Treadmill   MPH 2.5    Grade 2    Minutes 15    METs 3.4      Recumbant Elliptical   Level 3    RPM 54    Watts 70    Minutes 15    METs 3      Oxygen    Maintain Oxygen  Saturation 88% or higher          Nutrition:  Target Goals: Understanding of nutrition guidelines, daily intake of sodium 1500mg , cholesterol  200mg , calories 30% from fat and 7% or less from saturated fats, daily to have 5 or more servings of fruits and vegetables.  Biometrics:    Nutrition Therapy Plan and Nutrition Goals:  Nutrition Therapy & Goals - 06/01/24 1213       Nutrition Therapy   Diet General Healthy Diet    Drug/Food Interactions Statins/Certain Fruits      Personal Nutrition Goals   Nutrition Goal Patient to improve diet quality by using the plate method as a guide for meal planning to include lean protein/plant protein, fruits, vegetables, whole grains, nonfat dairy as part of a well-balanced diet.    Comments Patient has medical history of COPD4, HTN, CAD, CHF,  hyperlipidemia, aortic atherosclerosis, fatty liver, DM2. LDL is well controlled; she continues statin. A1c is not at goal; treated with metformin , sliding scale insulin . She does take 10mg  prednisone  daily. She reports following a diabetic diet. Patient will benefit from participation in ipulmonary rehab for nutrition education, exercise, and lifestyle modification.      Intervention Plan   Intervention Prescribe, educate and counsel regarding individualized specific dietary modifications aiming towards targeted core components such as weight, hypertension, lipid management, diabetes, heart failure and other comorbidities.;Nutrition handout(s) given to patient.    Expected Outcomes Short Term Goal: Understand basic principles of dietary content, such as calories, fat, sodium, cholesterol and nutrients.;Long Term Goal: Adherence to prescribed nutrition plan.          Nutrition Assessments:  MEDIFICTS Score Key: >=70 Need to make dietary changes  40-70 Heart Healthy Diet <= 40 Therapeutic Level Cholesterol Diet   Picture Your Plate Scores: <59 Unhealthy dietary pattern with much room for improvement. 41-50 Dietary pattern unlikely to meet recommendations for good health and room for improvement. 51-60 More healthful dietary pattern, with some  room for improvement.  >60 Healthy dietary pattern, although there may be some specific behaviors that could be improved.    Nutrition Goals Re-Evaluation:  Nutrition Goals Re-Evaluation     Row Name 06/01/24 1213             Goals   Current Weight 187 lb 6.3 oz (85 kg)       Comment A1c 7.4, triglycerides 224, LDL 66, HDL 47       Expected Outcome Patient has medical history of COPD4, HTN, CAD, CHF, hyperlipidemia, aortic atherosclerosis, fatty liver, DM2. LDL is well controlled; she continues statin. A1c is not at goal; treated with metformin , sliding scale insulin . She does take 10mg  prednisone  daily. She reports following a diabetic diet. Patient will benefit from participation in ipulmonary rehab for nutrition education, exercise, and lifestyle modification.          Nutrition Goals Discharge (Final Nutrition Goals Re-Evaluation):  Nutrition Goals Re-Evaluation - 06/01/24 1213  Goals   Current Weight 187 lb 6.3 oz (85 kg)    Comment A1c 7.4, triglycerides 224, LDL 66, HDL 47    Expected Outcome Patient has medical history of COPD4, HTN, CAD, CHF, hyperlipidemia, aortic atherosclerosis, fatty liver, DM2. LDL is well controlled; she continues statin. A1c is not at goal; treated with metformin , sliding scale insulin . She does take 10mg  prednisone  daily. She reports following a diabetic diet. Patient will benefit from participation in ipulmonary rehab for nutrition education, exercise, and lifestyle modification.          Psychosocial: Target Goals: Acknowledge presence or absence of significant depression and/or stress, maximize coping skills, provide positive support system. Participant is able to verbalize types and ability to use techniques and skills needed for reducing stress and depression.  Initial Review & Psychosocial Screening:  Initial Psych Review & Screening - 05/28/24 0929       Initial Review   Current issues with None Identified      Family Dynamics    Good Support System? Yes    Comments sister and sons      Barriers   Psychosocial barriers to participate in program There are no identifiable barriers or psychosocial needs.      Screening Interventions   Interventions Encouraged to exercise          Quality of Life Scores:  Scores of 19 and below usually indicate a poorer quality of life in these areas.  A difference of  2-3 points is a clinically meaningful difference.  A difference of 2-3 points in the total score of the Quality of Life Index has been associated with significant improvement in overall quality of life, self-image, physical symptoms, and general health in studies assessing change in quality of life.  PHQ-9: Review Flowsheet  More data exists      06/16/2024 05/28/2024 04/20/2024 04/01/2024 03/26/2024  Depression screen PHQ 2/9  Decreased Interest 0 0 0 0 0  Down, Depressed, Hopeless 0 0 0 0 0  PHQ - 2 Score 0 0 0 0 0  Altered sleeping 0 0 - - -  Tired, decreased energy 0 0 - - -  Change in appetite 0 0 - - -  Feeling bad or failure about yourself  0 0 - - -  Trouble concentrating 0 0 - - -  Moving slowly or fidgety/restless 0 0 - - -  Suicidal thoughts 0 0 - - -  PHQ-9 Score 0 0 - - -  Difficult doing work/chores Not difficult at all Not difficult at all - - -   Interpretation of Total Score  Total Score Depression Severity:  1-4 = Minimal depression, 5-9 = Mild depression, 10-14 = Moderate depression, 15-19 = Moderately severe depression, 20-27 = Severe depression   Psychosocial Evaluation and Intervention:  Psychosocial Evaluation - 05/28/24 0930       Psychosocial Evaluation & Interventions   Interventions Encouraged to exercise with the program and follow exercise prescription    Comments [name] denies any psychosocial barriers at this time.    Expected Outcomes For [name] to participate in rehab free of psychosocial barriers.    Continue Psychosocial Services  No Follow up required           Psychosocial Re-Evaluation:  Psychosocial Re-Evaluation     Row Name 06/15/24 1539             Psychosocial Re-Evaluation   Current issues with None Identified       Comments Monthly psychosocial  re-evaluation as follows: [name] denies any psychosocial barriers at this time.       Expected Outcomes For [name] to continue to participate in rehab free of psychosocial barriers.       Interventions Encouraged to attend Pulmonary Rehabilitation for the exercise       Continue Psychosocial Services  No Follow up required          Psychosocial Discharge (Final Psychosocial Re-Evaluation):  Psychosocial Re-Evaluation - 06/15/24 1539       Psychosocial Re-Evaluation   Current issues with None Identified    Comments Monthly psychosocial re-evaluation as follows: [name] denies any psychosocial barriers at this time.    Expected Outcomes For [name] to continue to participate in rehab free of psychosocial barriers.    Interventions Encouraged to attend Pulmonary Rehabilitation for the exercise    Continue Psychosocial Services  No Follow up required          Education: Education Goals: Education classes will be provided on a weekly basis, covering required topics. Participant will state understanding/return demonstration of topics presented.  Learning Barriers/Preferences:  Learning Barriers/Preferences - 05/28/24 0931       Learning Barriers/Preferences   Learning Preferences None          Education Topics: Know Your Numbers Group instruction that is supported by a PowerPoint presentation. Instructor discusses importance of knowing and understanding resting, exercise, and post-exercise oxygen  saturation, heart rate, and blood pressure. Oxygen  saturation, heart rate, blood pressure, rating of perceived exertion, and dyspnea are reviewed along with a normal range for these values.    Exercise for the Pulmonary Patient Group instruction that is supported by a PowerPoint  presentation. Instructor discusses benefits of exercise, core components of exercise, frequency, duration, and intensity of an exercise routine, importance of utilizing pulse oximetry during exercise, safety while exercising, and options of places to exercise outside of rehab.    MET Level  Group instruction provided by PowerPoint, verbal discussion, and written material to support subject matter. Instructor reviews what METs are and how to increase METs.  Flowsheet Row PULMONARY REHAB CHRONIC OBSTRUCTIVE PULMONARY DISEASE from 06/03/2024 in [facility]  Date 06/03/24  Educator EP  Instruction Review Code 1- Verbalizes Understanding    Pulmonary Medications Verbally interactive group education provided by instructor with focus on inhaled medications and proper administration.   Anatomy and Physiology of the Respiratory System Group instruction provided by PowerPoint, verbal discussion, and written material to support subject matter. Instructor reviews respiratory cycle and anatomical components of the respiratory system and their functions. Instructor also reviews differences in obstructive and restrictive respiratory diseases with examples of each.  Flowsheet Row PULMONARY REHAB CHRONIC OBSTRUCTIVE PULMONARY DISEASE from 06/17/2024 in [facility]  Date 06/17/24  Educator RT  Instruction Review Code 1- Verbalizes Understanding    Oxygen  Safety Group instruction provided by PowerPoint, verbal discussion, and written material to support subject matter. There is an overview of "What is Oxygen " and "Why do we need it".  Instructor also reviews how to create a safe environment for oxygen  use, the importance of using oxygen  as prescribed, and the risks of noncompliance. There is a brief discussion on traveling with oxygen  and resources the patient may utilize.   Oxygen  Use Group  instruction provided by PowerPoint, verbal discussion, and written material to discuss how supplemental oxygen  is prescribed and different types of oxygen  supply systems. Resources for  more information are provided.    Breathing Techniques Group instruction that is supported by demonstration and informational handouts. Instructor discusses the benefits of pursed lip and diaphragmatic breathing and detailed demonstration on how to perform both.     Risk Factor Reduction Group instruction that is supported by a PowerPoint presentation. Instructor discusses the definition of a risk factor, different risk factors for pulmonary disease, and how the heart and lungs work together.   Pulmonary Diseases Group instruction provided by PowerPoint, verbal discussion, and written material to support subject matter. Instructor gives an overview of the different type of pulmonary diseases. There is also a discussion on risk factors and symptoms as well as ways to manage the diseases. Flowsheet Row PULMONARY REHAB CHRONIC OBSTRUCTIVE PULMONARY DISEASE from 06/10/2024 in [facility], [facility]  Date 06/10/24  Educator RT  Instruction Review Code 1- Verbalizes Understanding    Stress and Energy Conservation Group instruction provided by PowerPoint, verbal discussion, and written material to support subject matter. Instructor gives an overview of stress and the impact it can have on the body. Instructor also reviews ways to reduce stress. There is also a discussion on energy conservation and ways to conserve energy throughout the day.   Warning Signs and Symptoms Group instruction provided by PowerPoint, verbal discussion, and written material to support subject matter. Instructor reviews warning signs and symptoms of stroke, heart attack, cold and flu. Instructor also reviews ways to prevent the spread of infection.   Other Education Group or individual verbal,  written, or video instructions that support the educational goals of the pulmonary rehab program.    Knowledge Questionnaire Score:  Knowledge Questionnaire Score - 05/28/24 0931       Knowledge Questionnaire Score   Pre Score 17/18          Core Components/Risk Factors/Patient Goals at Admission:  Personal Goals and Risk Factors at Admission - 05/28/24 0931       Core Components/Risk Factors/Patient Goals on Admission   Improve shortness of breath with ADL's Yes    Intervention Provide education, individualized exercise plan and daily activity instruction to help decrease symptoms of SOB with activities of daily living.    Expected Outcomes Short Term: Improve cardiorespiratory fitness to achieve a reduction of symptoms when performing ADLs;Long Term: Be able to perform more ADLs without symptoms or delay the onset of symptoms          Core Components/Risk Factors/Patient Goals Review:   Goals and Risk Factor Review     Row Name 06/15/24 1542             Core Components/Risk Factors/Patient Goals Review   Personal Goals Review Improve shortness of breath with ADL's;Develop more efficient breathing techniques such as purse lipped breathing and diaphragmatic breathing and practicing self-pacing with activity.       Review Monthly review of patient's Core Components/Risk Factors/Patient Goals are as follows: Goal in progress for improving her shortness of breath with ADLs and developing more efficient breathing techniques such as purse lipped breathing and diaphragmatic breathing; and practicing self-pacing with activity. Staff is working with her on her breathing techniques. Her oxygen  needs have been WNL on room air. She is trying hard to build up her endurance and stamina to complete activities of daily living at home with decreased shortness of breath. [name] will continue to benefit from PR for nutrition, education, exercise, and lifestyle modification.       Expected  Outcomes Pt will show progress toward meeting expected goals and outcomes.          Core Components/Risk Factors/Patient Goals at Discharge (Final Review):   Goals and Risk Factor Review - 06/15/24 1542       Core Components/Risk Factors/Patient Goals Review   Personal Goals Review Improve shortness of breath with ADL's;Develop more efficient breathing techniques such as purse lipped breathing and diaphragmatic breathing and practicing self-pacing with activity.    Review Monthly review of patient's Core Components/Risk Factors/Patient Goals are as follows: Goal in progress for improving her shortness of breath with ADLs and developing more efficient breathing techniques such as purse lipped breathing and diaphragmatic breathing; and practicing self-pacing with activity. Staff is working with her on her breathing techniques. Her oxygen  needs have been WNL on room air. She is trying hard to build up her endurance and stamina to complete activities of daily living at home with decreased shortness of breath. Shakeila will continue to benefit from PR for nutrition, education, exercise, and lifestyle modification.    Expected Outcomes Pt will show progress toward meeting expected goals and outcomes.          ITP Comments: Pt is making expected progress toward Pulmonary Rehab goals after completing 6 session(s). Recommend continued exercise, life style modification, education, and utilization of breathing techniques to increase stamina and strength, while also decreasing shortness of breath with exertion.  Dr. Slater Staff is Medical Director for Pulmonary Rehab at [facility].

## 2024-06-24 ENCOUNTER — Encounter (HOSPITAL_COMMUNITY)

## 2024-06-25 ENCOUNTER — Ambulatory Visit (INDEPENDENT_AMBULATORY_CARE_PROVIDER_SITE_OTHER): Admitting: Internal Medicine

## 2024-06-25 ENCOUNTER — Ambulatory Visit: Admitting: Internal Medicine

## 2024-06-25 VITALS — BP 118/70 | HR 74 | Temp 98.5°F | Ht 61.0 in | Wt 187.0 lb

## 2024-06-25 DIAGNOSIS — J441 Chronic obstructive pulmonary disease with (acute) exacerbation: Secondary | ICD-10-CM

## 2024-06-25 DIAGNOSIS — U071 COVID-19: Secondary | ICD-10-CM

## 2024-06-25 DIAGNOSIS — Z7984 Long term (current) use of oral hypoglycemic drugs: Secondary | ICD-10-CM

## 2024-06-25 DIAGNOSIS — R6889 Other general symptoms and signs: Secondary | ICD-10-CM | POA: Diagnosis not present

## 2024-06-25 DIAGNOSIS — E119 Type 2 diabetes mellitus without complications: Secondary | ICD-10-CM | POA: Diagnosis not present

## 2024-06-25 DIAGNOSIS — I10 Essential (primary) hypertension: Secondary | ICD-10-CM

## 2024-06-25 DIAGNOSIS — E559 Vitamin D deficiency, unspecified: Secondary | ICD-10-CM | POA: Diagnosis not present

## 2024-06-25 MED ORDER — HYDROCODONE BIT-HOMATROP MBR 5-1.5 MG/5ML PO SOLN: 5.0000 mL | Freq: Four times a day (QID) | ORAL | 0 refills | Status: AC | PRN

## 2024-06-25 MED ORDER — PREDNISONE 10 MG PO TABS: ORAL_TABLET | ORAL | 0 refills | Status: DC

## 2024-06-25 MED ORDER — NIRMATRELVIR/RITONAVIR (PAXLOVID)TABLET: 3.0000 | ORAL_TABLET | Freq: Two times a day (BID) | ORAL | 0 refills | Status: AC

## 2024-06-25 NOTE — Progress Notes (Unsigned)
 Patient ID: [name], female   DOB: 12-17-1955, 68 y.o.   MRN: [id number]        Chief Complaint: follow up covid infection, copd exacerbation, htn, low vit d       HPI:  [name] is a 68 y.o. female here with onset URI symptoms x 2 days, and also mild wheezing sob doe since yesterday.  Roommate also ill with known covid, but seemed better in a few days.  Pt concerned she also has covid infection, and asking for testing today.   Pt denies polydipsia, polyuria, or new focal neuro s/s.          Wt Readings from Last 3 Encounters:  06/25/24 187 lb (84.8 kg)  06/17/24 190 lb 14.7 oz (86.6 kg)  06/16/24 189 lb (85.7 kg)   BP Readings from Last 3 Encounters:  06/25/24 118/70  05/18/24 109/68  05/18/24 125/70         Past Medical History:  Diagnosis Date   Bronchitis    CAD (coronary artery disease)    a. 02/2002: CABG x3 with LIMA to LAD, RIMA to RCA, and SVG to OM   CHF (congestive heart failure) (HCC)    COPD (chronic obstructive pulmonary disease) (HCC)    Diabetes (HCC)    Headache    Heart disease    Hyperlipidemia    Hypertension    Hypertensive emergency 07/01/2013   Impaired glucose tolerance 08/20/2014   Sinusitis    Past Surgical History:  Procedure Laterality Date   CORONARY ARTERY BYPASS GRAFT  2003   Triple bypass   ESOPHAGOGASTRODUODENOSCOPY Left 06/13/2014   Procedure: ESOPHAGOGASTRODUODENOSCOPY (EGD);  Surgeon: Renaye Sous, MD;  Location: [facility] ENDOSCOPY;  Service: Endoscopy;  Laterality: Left;   RIGID ESOPHAGOSCOPY N/A 06/12/2014   Procedure: RIGID ESOPHAGOSCOPY WITH FOREIGN BODY REMOVAL;  Surgeon: Ana LELON Moccasin, MD;  Location: [facility] OR;  Service: ENT;  Laterality: N/A;    reports that she quit smoking about 5 years ago. Her smoking use included cigarettes. She started smoking about 52 years ago. She has a 23.7 pack-year smoking history. She has never used smokeless tobacco. She reports that she does not currently use alcohol. She reports that she does not  currently use drugs after having used the following drugs: Marijuana. family history includes Brain cancer in her mother; Cancer in her maternal grandmother and another family member; Heart disease in her mother; Lung cancer in her maternal aunt and paternal uncle; Stroke in an other family member; Suicidality in her brother. Allergies  Allergen Reactions   Chantix  [Varenicline ] Nausea And Vomiting   Augmentin  [Amoxicillin -Pot Clavulanate] Nausea And Vomiting   Current Outpatient Medications on File Prior to Visit  Medication Sig Dispense Refill   acetaminophen  (TYLENOL ) 500 MG tablet Take 1,000 mg by mouth 2 (two) times daily as needed for headache, fever, moderate pain or mild pain.     albuterol  (VENTOLIN  HFA) 108 (90 Base) MCG/ACT inhaler INHALE 1-2 PUFFS BY MOUTH EVERY 6 HOURS AS NEEDED FOR WHEEZE OR SHORTNESS OF BREATH 18 each 5   alendronate  (FOSAMAX ) 70 MG tablet TAKE 1 TABLET (70 MG TOTAL) BY MOUTH EVERY 7 DAYS WITH FULL GLASS WATER ON EMPTY STOMACH 12 tablet 3   amLODipine  (NORVASC ) 5 MG tablet TAKE 1 TABLET (5 MG TOTAL) BY MOUTH DAILY. 90 tablet 0   atorvastatin  (LIPITOR) 40 MG tablet TAKE 1 TABLET BY MOUTH EVERY DAY 90 tablet 3   bisoprolol  (ZEBETA ) 5 MG tablet TAKE 1 TABLET (5  MG TOTAL) BY MOUTH DAILY. 90 tablet 2   Cholecalciferol  (VITAMIN D -3 PO) Take 1 capsule by mouth in the morning.     Cyanocobalamin  (VITAMIN B-12 PO) Take 1 tablet by mouth in the morning.     Dulaglutide  (TRULICITY ) 3 MG/0.5ML SOAJ Inject 3 mg as directed once a week. 6 mL 3   fluticasone  (FLONASE ) 50 MCG/ACT nasal spray Place 1 spray into both nostrils daily. 15.8 mL 0   guaiFENesin -dextromethorphan  (ROBITUSSIN DM) 100-10 MG/5ML syrup Take 5 mLs by mouth every 4 (four) hours as needed for cough. 118 mL 0   ipratropium-albuterol  (DUONEB) 0.5-2.5 (3) MG/3ML SOLN INHALE 3 ML BY NEBULIZER EVERY 6 HOURS AS NEEDED 360 mL 11   isosorbide  mononitrate (IMDUR ) 60 MG 24 hr tablet TAKE 1.5 TABLETS (90 MG TOTAL) BY MOUTH  DAILY. 135 tablet 3   MAGNESIUM  OXIDE PO Take 1 tablet by mouth at bedtime.     metFORMIN  (GLUCOPHAGE -XR) 500 MG 24 hr tablet TAKE 2 TABLET BY MOUTH EVERY DAY WITH BREAKFAST 180 tablet 3   Multiple Vitamins-Minerals (MULTIVITAMIN WOMEN 50+) TABS Take 1 tablet by mouth in the morning.     OXYGEN  Inhale 3 L into the lungs continuous.     predniSONE  (DELTASONE ) 10 MG tablet Take 6 tablets (60 mg) daily for 2 days, then, Take 5 tablets (50 mg) daily for 2 days, then, Take 4 tablets (40 mg) daily for 2 days, then, Take 3 tablets (30 mg) daily for 2 days, then Take 2 tablets (20mg ) daily for 2 days then start your home dose of once a day 40 tablet 0   predniSONE  (DELTASONE ) 10 MG tablet Take 1 tablet (10 mg total) by mouth daily with breakfast. After completing taper 90 tablet 1   torsemide  (DEMADEX ) 20 MG tablet Take 1 tablet (20 mg total) by mouth daily. 30 tablet 2   TRELEGY ELLIPTA  200-62.5-25 MCG/ACT AEPB INHALE 1 PUFF BY MOUTH EVERY DAY 60 each 8   No current facility-administered medications on file prior to visit.        ROS:  All others reviewed and negative.  Objective        PE:  BP 118/70   Pulse 74   Temp 98.5 F (36.9 C)   Ht 5' 1 (1.549 m)   Wt 187 lb (84.8 kg)   SpO2 99%   BMI 35.33 kg/m                 Constitutional: Pt appears mild ill, on home o2               HENT: Head: NCAT.                Right Ear: External ear normal.                 Left Ear: External ear normal. Bilat tm's with mild erythema.  Max sinus areas non tender.  Pharynx with mild erythema, no exudate                Eyes: . Pupils are equal, round, and reactive to light. Conjunctivae and EOM are normal               Nose: without d/c or deformity               Neck: Neck supple. Gross normal ROM               Cardiovascular: Normal rate and regular rhythm.  Pulmonary/Chest: Effort normal and breath sounds without rales or wheezing.                Abd:  Soft, NT, ND, + BS, no  organomegaly               Neurological: Pt is alert. At baseline orientation, motor grossly intact               Skin: Skin is warm. No rashes, no other new lesions, LE edema - none               Psychiatric: Pt behavior is normal without agitation   Micro: none  Cardiac tracings I have personally interpreted today:  none  Pertinent Radiological findings (summarize): none   Lab Results  Component Value Date   WBC 11.5 (H) 03/22/2024   HGB 13.9 03/22/2024   HCT 44.9 03/22/2024   PLT 213 03/22/2024   GLUCOSE 186 (H) 03/23/2024   CHOL 158 03/11/2024   TRIG 224.0 (H) 03/11/2024   HDL 47.70 03/11/2024   LDLDIRECT 82.0 04/17/2023   LDLCALC 66 03/11/2024   ALT 25 03/11/2024   AST 16 03/11/2024   NA 140 03/23/2024   K 4.3 03/23/2024   CL 89 (L) 03/23/2024   CREATININE 0.62 03/23/2024   BUN 22 03/23/2024   CO2 42 (H) 03/23/2024   TSH 2.34 03/11/2024   INR 1.0 12/07/2023   HGBA1C 7.4 (H) 03/11/2024   MICROALBUR 6.2 (H) 03/11/2024   COVID - positive POCT  Assessment/Plan:  [name] is a 68 y.o. Other or two or more races [6] female with  has a past medical history of Bronchitis, CAD (coronary artery disease), CHF (congestive heart failure) (HCC), COPD (chronic obstructive pulmonary disease) (HCC), Diabetes (HCC), Headache, Heart disease, Hyperlipidemia, Hypertension, Hypertensive emergency (07/01/2013), Impaired glucose tolerance (08/20/2014), and Sinusitis.  COPD exacerbation (HCC) Mild to mod, for prednisone  taper, cont inhaler nebs,  to f/u any worsening symptoms or concerns  Essential hypertension BP Readings from Last 3 Encounters:  06/25/24 118/70  05/18/24 109/68  05/18/24 125/70   Stable, pt to continue medical treatment norvasc  5 every day, zebeta  5 every day,    Non-insulin  dependent type 2 diabetes mellitus (HCC) Lab Results  Component Value Date   HGBA1C 7.4 (H) 03/11/2024   Uncontrolled, but close and pt delcines change today,  pt to continue  current medical treatment truliicty 3 mg weekly, metformin  ER 500 mg - 2 every day,    Vitamin D  deficiency Last vitamin D  Lab Results  Component Value Date   VD25OH 50.52 03/11/2024   Stable, cont oral replacement   COVID-19 virus infection Mild to mod, for antibx course paxlovid  course, cough med prn,,  to f/u any worsening symptoms or concerns  Followup: Return if symptoms worsen or fail to improve.  Lynwood Rush, MD 06/28/2024 12:02 PM [facility]

## 2024-06-25 NOTE — Patient Instructions (Addendum)
 Your covid testing is Positive today  Please take all new medication as prescribed - the paxlovid  antibiotic, and cough medicine, and prednisone   Please continue all other medications as before, and refills have been done if requested.  Please have the pharmacy call with any other refills you may need.  Please keep your appointments with your specialists as you may have planned

## 2024-06-26 ENCOUNTER — Encounter: Payer: Self-pay | Admitting: Internal Medicine

## 2024-06-26 DIAGNOSIS — U071 COVID-19: Secondary | ICD-10-CM | POA: Insufficient documentation

## 2024-06-26 NOTE — Assessment & Plan Note (Signed)
 Mild to mod, for antibx course paxlovid  course, cough med prn,,  to f/u any worsening symptoms or concerns

## 2024-06-26 NOTE — Assessment & Plan Note (Signed)
 Lab Results  Component Value Date   HGBA1C 7.4 (H) 03/11/2024   Uncontrolled, but close and pt delcines change today,  pt to continue current medical treatment truliicty 3 mg weekly, metformin  ER 500 mg - 2 every day,

## 2024-06-26 NOTE — Assessment & Plan Note (Signed)
 Last vitamin D  Lab Results  Component Value Date   VD25OH 50.52 03/11/2024   Stable, cont oral replacement

## 2024-06-26 NOTE — Assessment & Plan Note (Signed)
 BP Readings from Last 3 Encounters:  06/25/24 118/70  05/18/24 109/68  05/18/24 125/70   Stable, pt to continue medical treatment norvasc  5 every day, zebeta  5 every day,

## 2024-06-26 NOTE — Assessment & Plan Note (Signed)
 Mild to mod, for prednisone  taper, cont inhaler nebs,  to f/u any worsening symptoms or concerns

## 2024-06-28 LAB — POCT INFLUENZA A/B
Influenza A, POC: NEGATIVE
Influenza B, POC: NEGATIVE

## 2024-06-28 LAB — POC COVID19 BINAXNOW: SARS Coronavirus 2 Ag: POSITIVE — AB

## 2024-06-29 ENCOUNTER — Encounter (HOSPITAL_COMMUNITY)

## 2024-07-01 ENCOUNTER — Encounter (HOSPITAL_COMMUNITY)

## 2024-07-01 ENCOUNTER — Encounter: Payer: Self-pay | Admitting: Internal Medicine

## 2024-07-02 ENCOUNTER — Other Ambulatory Visit: Payer: Self-pay | Admitting: Cardiology

## 2024-07-06 ENCOUNTER — Encounter (HOSPITAL_COMMUNITY)

## 2024-07-06 ENCOUNTER — Telehealth (HOSPITAL_COMMUNITY): Payer: Self-pay

## 2024-07-06 NOTE — Telephone Encounter (Signed)
 Received VM from pt stating she will be absent from PR 9/16 and 9/18. No reason given. Will cancel those appts.

## 2024-07-07 ENCOUNTER — Other Ambulatory Visit: Payer: Self-pay

## 2024-07-07 NOTE — Patient Outreach (Signed)
 Complex Care Management   Visit Note  07/07/2024  Name:  [name] MRN: [id number] DOB: 06-Mar-1956  Situation: Referral received for Complex Care Management related to Heart Failure and COPD I obtained verbal consent from Patient.  Visit completed with Patient  on the phone  Background:   Past Medical History:  Diagnosis Date   Bronchitis    CAD (coronary artery disease)    a. 02/2002: CABG x3 with LIMA to LAD, RIMA to RCA, and SVG to OM   CHF (congestive heart failure) (HCC)    COPD (chronic obstructive pulmonary disease) (HCC)    Diabetes (HCC)    Headache    Heart disease    Hyperlipidemia    Hypertension    Hypertensive emergency 07/01/2013   Impaired glucose tolerance 08/20/2014   Sinusitis     Assessment: Patient Reported Symptoms:  Cognitive Cognitive Status: No symptoms reported      Neurological Neurological Review of Symptoms: No symptoms reported    HEENT HEENT Symptoms Reported: No symptoms reported      Cardiovascular Cardiovascular Symptoms Reported: No symptoms reported (denies any signs/symptoms of HF exacerbation) Does patient have uncontrolled Hypertension?: No Weight: 182 lb (82.6 kg)  Respiratory Respiratory Symptoms Reported: No symptoms reported Other Respiratory Symptoms: patient reports SOB with activity. reports Covid about 2 weeks ago. states thick green sputum. does not feel like it is getting better, reports cough increased also. reports will follow up with PCP next Respiratory Management Strategies: Oxygen  therapy  Endocrine Endocrine Symptoms Reported: No symptoms reported Is patient diabetic?: Yes Is patient checking blood sugars at home?: Yes List most recent blood sugar readings, include date and time of day: reports taking Prednisone  BS reading last checked 180 at 12 noon    Gastrointestinal Gastrointestinal Symptoms Reported: No symptoms reported      Genitourinary Genitourinary Symptoms Reported: No symptoms reported     Integumentary Integumentary Symptoms Reported: No symptoms reported    Musculoskeletal Musculoskelatal Symptoms Reviewed: No symptoms reported        Psychosocial Psychosocial Symptoms Reported: No symptoms reported          Vitals:   07/07/24 1503  BP: (!) 140/80  SpO2: 95%    Medications Reviewed Today     Reviewed by Yasha Tibbett M, RN (Registered Nurse) on 07/07/24 at 1458  Med List Status: <None>   Medication Order Taking? Sig Documenting Provider Last Dose Status Informant  acetaminophen  (TYLENOL ) 500 MG tablet [id number] Yes Take 1,000 mg by mouth 2 (two) times daily as needed for headache, fever, moderate pain or mild pain. [provider]  Active Pharmacy Records, Self  albuterol  (VENTOLIN  HFA) 108 [phone] Base) MCG/ACT inhaler [id number] Yes INHALE 1-2 PUFFS BY MOUTH EVERY 6 HOURS AS NEEDED FOR WHEEZE OR SHORTNESS OF SHERIDA [name] Harden LULLA, MD  Active   alendronate  (FOSAMAX ) 70 MG tablet [id number] Yes TAKE 1 TABLET (70 MG TOTAL) BY MOUTH EVERY 7 DAYS WITH FULL GLASS WATER ON EMPTY STOMACH Norleen Lynwood ORN, MD  Active   amLODipine  (NORVASC ) 5 MG tablet [id number] Yes TAKE 1 TABLET (5 MG TOTAL) BY MOUTH DAILY. Jeffrie Oneil BROCKS, MD  Active   atorvastatin  (LIPITOR) 40 MG tablet [id number] Yes TAKE 1 TABLET BY MOUTH EVERY DAY Norleen Lynwood ORN, MD  Active   bisoprolol  (ZEBETA ) 5 MG tablet [id number] Yes TAKE 1 TABLET (5 MG TOTAL) BY MOUTH DAILY. Jeffrie Oneil BROCKS, MD  Active Pharmacy Records, Self  Cholecalciferol  (VITAMIN D -3 PO) [id number] Yes Take 1 capsule  by mouth in the morning. [provider]  Active Pharmacy Records, Self  Cyanocobalamin  (VITAMIN B-12 PO) [id number] Yes Take 1 tablet by mouth in the morning. [provider]  Active Pharmacy Records, Self  Dulaglutide  (TRULICITY ) 3 MG/0.5ML EMMANUEL [id number] Yes Inject 3 mg as directed once a week. Norleen Lynwood ORN, MD  Active Pharmacy Records, Self  fluticasone  (FLONASE ) 50 MCG/ACT nasal spray [id number] Yes Place 1  spray into both nostrils daily. Billy Asberry FALCON, PA-C  Active Pharmacy Records, Self  guaiFENesin -dextromethorphan  [facility] DM) 100-10 MG/5ML syrup [id number] Yes Take 5 mLs by mouth every 4 (four) hours as needed for cough. Pokhrel, Laxman, MD  Active   ipratropium-albuterol  (DUONEB) 0.5-2.5 (3) MG/3ML SOLN [id number] Yes INHALE 3 ML BY NEBULIZER EVERY 6 HOURS AS NEEDED [name] Harden GAILS, MD  Active Pharmacy Records, Self  isosorbide  mononitrate (IMDUR ) 60 MG 24 hr tablet [id number] Yes TAKE 1.5 TABLETS (90 MG TOTAL) BY MOUTH DAILY. Jeffrie Oneil BROCKS, MD  Active   MAGNESIUM  OXIDE PO [id number] Yes Take 1 tablet by mouth at bedtime. [provider]  Active Pharmacy Records, Self  metFORMIN  (GLUCOPHAGE -XR) 500 MG 24 hr tablet [id number] Yes TAKE 2 TABLET BY MOUTH EVERY DAY WITH BREAKFAST John, James W, MD  Active Pharmacy Records, Self  Multiple Vitamins-Minerals (MULTIVITAMIN WOMEN 50+) TABS [id number] Yes Take 1 tablet by mouth in the morning. [provider]  Active Pharmacy Records, Self  OXYGEN  [id number] Yes Inhale 3 L into the lungs continuous. [provider]  Active Self  predniSONE  (DELTASONE ) 10 MG tablet [id number]  Take 6 tablets (60 mg) daily for 2 days, then, Take 5 tablets (50 mg) daily for 2 days, then, Take 4 tablets (40 mg) daily for 2 days, then, Take 3 tablets (30 mg) daily for 2 days, then Take 2 tablets (20mg ) daily for 2 days then start your home dose of once a day Pokhrel, Laxman, MD  Active   predniSONE  (DELTASONE ) 10 MG tablet [id number] Yes Take 1 tablet (10 mg total) by mouth daily with breakfast. After completing taper Norleen Lynwood ORN, MD  Active   predniSONE  (DELTASONE ) 10 MG tablet [id number]  Tale 4 tab by mouth x 3 days, 3 tabs x 3 days, 2 tabs x 3 days, 1 tab x 3 days Norleen Lynwood ORN, MD  Active   torsemide  (DEMADEX ) 20 MG tablet [id number] Yes Take 1 tablet (20 mg total) by mouth daily. Norleen Lynwood ORN, MD  Active   TRELEGY ELLIPTA  200-62.5-25 MCG/ACT AEPB  [id number] Yes INHALE 1 PUFF BY MOUTH EVERY DAY [name] Harden GAILS, MD  Active           Recommendation:   PCP Follow-up Continue Current Plan of Care  Follow Up Plan:   Telephone follow up appointment date/time:  07/28/24 at 2:30 pm Heddy Shutter, RN, MSN, BSN, CCM [place]  [facility], Population Health Case Manager Phone: [phone]

## 2024-07-07 NOTE — Patient Instructions (Signed)
 Visit Information  Thank you for taking time to visit with me today. Please don't hesitate to contact me if I can be of assistance to you before our next scheduled appointment.  Your next care management appointment is by telephone on 07/28/24 at 2:30 pm   Please call the care guide team at [phone] if you need to cancel, schedule, or reschedule an appointment.   Please call the Suicide and Crisis Lifeline: 988 call the [place]  National Suicide Prevention Lifeline: 3466036652 or TTY: 210-274-4489 TTY 205-287-7574) to talk to a trained counselor if you are experiencing a Mental Health or Behavioral Health Crisis or need someone to talk to.  Heddy Shutter, RN, MSN, BSN, CCM [place]  [facility] Population Health Case Manager Phone: [phone]

## 2024-07-08 ENCOUNTER — Encounter (HOSPITAL_COMMUNITY)

## 2024-07-09 NOTE — Addendum Note (Signed)
 Encounter addended by: Almin Livingstone, RN on: 07/09/2024 11:04 AM  Actions taken: Flowsheet data copied forward, Flowsheet accepted

## 2024-07-09 NOTE — Addendum Note (Signed)
 Encounter addended by: Nicholaus Johnnie PARAS on: 07/09/2024 10:37 AM  Actions taken: Flowsheet data copied forward, Flowsheet accepted

## 2024-07-11 ENCOUNTER — Encounter: Payer: Self-pay | Admitting: Internal Medicine

## 2024-07-11 ENCOUNTER — Other Ambulatory Visit: Payer: Self-pay | Admitting: Internal Medicine

## 2024-07-12 ENCOUNTER — Telehealth (HOSPITAL_COMMUNITY): Payer: Self-pay

## 2024-07-12 NOTE — Telephone Encounter (Signed)
 Patient called requesting we cancel the rest of her pulmonary rehab appts, states she is going to the gym and feels she has improved enough that she no longer needs PR. Cancelled remaining appts, removed from schedule.

## 2024-07-13 ENCOUNTER — Encounter (HOSPITAL_COMMUNITY)

## 2024-07-14 NOTE — Progress Notes (Signed)
 Discharge Progress Report  Patient Details  Name: [name] MRN: [id number] Date of Birth: 1956/06/13 Referring Provider:   Conrad Ports Pulmonary Rehab Walk Test from 05/28/2024 in [facility]  Referring Provider Alva     Number of Visits: 6  Reason for Discharge:  Early Exit:  Personal  Smoking History:  Social History   Tobacco Use  Smoking Status Former   Current packs/day: 0.00   Average packs/day: 0.5 packs/day for 47.4 years (23.7 ttl pk-yrs)   Types: Cigarettes   Start date: 12/29/1971   Quit date: 05/22/2019   Years since quitting: 5.1  Smokeless Tobacco Never  Tobacco Comments   Smoking daily 1-2 day.  Stops for a while and then goes back.  08/29/2022 hfb    Diagnosis:  Stage 4 very severe COPD by GOLD classification (HCC)  ADL UCSD:  Pulmonary Assessment Scores     Row Name 05/28/24 0934         ADL UCSD   ADL Phase Entry     SOB Score total 20       CAT Score   CAT Score 4       mMRC Score   mMRC Score 1        Initial Exercise Prescription:  Initial Exercise Prescription - 05/28/24 1000       Date of Initial Exercise RX and Referring Provider   Date 05/28/24    Referring Provider [name]    Expected Discharge Date 08/24/24      Oxygen    Oxygen  Continuous    Liters 3    Maintain Oxygen  Saturation 88% or higher      Treadmill   MPH 1.7    Grade 1    Minutes 15    METs 2.3      Recumbant Elliptical   Level 2    RPM 50    Watts 60    Minutes 15    METs 2      Prescription Details   Frequency (times per week) 2    Duration Progress to 30 minutes of continuous aerobic without signs/symptoms of physical distress      Intensity   THRR 40-80% of Max Heartrate 61-122    Ratings of Perceived Exertion 11-13    Perceived Dyspnea 0-4      Progression   Progression Continue to progress workloads to maintain intensity without signs/symptoms of physical distress.       Resistance Training   Training Prescription Yes    Weight blue bands    Reps 10-15          Discharge Exercise Prescription (Final Exercise Prescription Changes):  Exercise Prescription Changes - 06/22/24 0900       Response to Exercise   Blood Pressure (Admit) 118/64    Blood Pressure (Exit) 106/60    Heart Rate (Admit) 79 bpm    Heart Rate (Exercise) 108 bpm    Heart Rate (Exit) 77 bpm    Oxygen  Saturation (Admit) 99 %    Oxygen  Saturation (Exercise) 95 %    Oxygen  Saturation (Exit) 98 %    Rating of Perceived Exertion (Exercise) 12    Perceived Dyspnea (Exercise) 1    Duration Continue with 30 min of aerobic exercise without signs/symptoms of physical distress.    Intensity THRR unchanged      Progression   Progression Continue to progress workloads to maintain intensity without signs/symptoms of physical distress.  Resistance Training   Training Prescription Yes    Weight blue bands    Reps 10-15    Time 10 Minutes      Oxygen    Oxygen  Continuous    Liters 3      Treadmill   MPH 2.5    Grade 2    Minutes 15    METs 3.4      Recumbant Elliptical   Level 3    RPM 54    Watts 70    Minutes 15    METs 3      Oxygen    Maintain Oxygen  Saturation 88% or higher          Functional Capacity:  6 Minute Walk     Row Name 05/28/24 1013         6 Minute Walk   Phase Initial     Distance 1110 feet     Walk Time 6 minutes     # of Rest Breaks 0     MPH 2.1     METS 2.2     RPE 11     Perceived Dyspnea  1     VO2 Peak 7.7     Symptoms No     Resting HR 70 bpm     Resting BP 108/62     Resting Oxygen  Saturation  99 %     Exercise Oxygen  Saturation  during 6 min walk 94 %     Max Ex. HR 108 bpm     Max Ex. BP 126/60     2 Minute Post BP 110/60       Interval HR   1 Minute HR 91     2 Minute HR 91     3 Minute HR 90     4 Minute HR 108     5 Minute HR 107     6 Minute HR 104     2 Minute Post HR 70     Interval Heart Rate? Yes        Interval Oxygen    Interval Oxygen ? Yes     Baseline Oxygen  Saturation % 99 %     1 Minute Oxygen  Saturation % 100 %     1 Minute Liters of Oxygen  2 L     2 Minute Oxygen  Saturation % 99 %     2 Minute Liters of Oxygen  2 L     3 Minute Oxygen  Saturation % 94 %     3 Minute Liters of Oxygen  2 L     4 Minute Oxygen  Saturation % 96 %     4 Minute Liters of Oxygen  2 L     5 Minute Oxygen  Saturation % 97 %     5 Minute Liters of Oxygen  2 L     6 Minute Oxygen  Saturation % 96 %     6 Minute Liters of Oxygen  2 L     2 Minute Post Oxygen  Saturation % 99 %     2 Minute Post Liters of Oxygen  2 L        Psychological, QOL, Others - Outcomes: PHQ 2/9:    06/16/2024    1:58 PM 05/28/2024   10:25 AM 04/20/2024   11:10 AM 04/01/2024   10:26 AM 03/26/2024   10:58 AM  Depression screen PHQ 2/9  Decreased Interest 0 0 0 0 0  Down, Depressed, Hopeless 0 0 0 0 0  PHQ - 2 Score 0 0 0 0  0  Altered sleeping 0 0     Tired, decreased energy 0 0     Change in appetite 0 0     Feeling bad or failure about yourself  0 0     Trouble concentrating 0 0     Moving slowly or fidgety/restless 0 0     Suicidal thoughts 0 0     PHQ-9 Score 0 0     Difficult doing work/chores Not difficult at all Not difficult at all       Quality of Life:   Personal Goals: Goals established at orientation with interventions provided to work toward goal.  Personal Goals and Risk Factors at Admission - 05/28/24 0931       Core Components/Risk Factors/Patient Goals on Admission   Improve shortness of breath with ADL's Yes    Intervention Provide education, individualized exercise plan and daily activity instruction to help decrease symptoms of SOB with activities of daily living.    Expected Outcomes Short Term: Improve cardiorespiratory fitness to achieve a reduction of symptoms when performing ADLs;Long Term: Be able to perform more ADLs without symptoms or delay the onset of symptoms           Personal Goals  Discharge:  Goals and Risk Factor Review     Row Name 06/15/24 1542 07/09/24 1101 07/14/24 1106         Core Components/Risk Factors/Patient Goals Review   Personal Goals Review Improve shortness of breath with ADL's;Develop more efficient breathing techniques such as purse lipped breathing and diaphragmatic breathing and practicing self-pacing with activity. Improve shortness of breath with ADL's;Develop more efficient breathing techniques such as purse lipped breathing and diaphragmatic breathing and practicing self-pacing with activity. Improve shortness of breath with ADL's;Develop more efficient breathing techniques such as purse lipped breathing and diaphragmatic breathing and practicing self-pacing with activity.     Review Monthly review of patient's Core Components/Risk Factors/Patient Goals are as follows: Goal in progress for improving her shortness of breath with ADLs and developing more efficient breathing techniques such as purse lipped breathing and diaphragmatic breathing; and practicing self-pacing with activity. Staff is working with her on her breathing techniques. Her oxygen  needs have been WNL on room air. She is trying hard to build up her endurance and stamina to complete activities of daily living at home with decreased shortness of breath. [name] will continue to benefit from PR for nutrition, education, exercise, and lifestyle modification. Monthly review of patient's Core Components/Risk Factors/Patient Goals are as follows: [name] has not been to class for the past 3 weeks d/t illness. Goal in progress for improving her shortness of breath with ADLs and developing more efficient breathing techniques such as purse lipped breathing and diaphragmatic breathing; and practicing self-pacing with activity. Staff is working with her on her breathing techniques. Her oxygen  needs have been WNL on room air. She is trying hard to build up her endurance and stamina to complete activities of  daily living at home with decreased shortness of breath. [name] will continue to benefit from PR for nutrition, education, exercise, and lifestyle modification. [name] was discharged from the PR program. Unable to assess her goals as she only attended 6 sessions.     Expected Outcomes Pt will show progress toward meeting expected goals and outcomes. Pt will show progress toward meeting expected goals and outcomes. To continue to exercise and modify her nutrition and lifestyle post discharge        Exercise Goals and Review:  Exercise Goals     Row Name 05/28/24 0935             Exercise Goals   Increase Physical Activity Yes       Intervention Provide advice, education, support and counseling about physical activity/exercise needs.;Develop an individualized exercise prescription for aerobic and resistive training based on initial evaluation findings, risk stratification, comorbidities and participant's personal goals.       Expected Outcomes Short Term: Attend rehab on a regular basis to increase amount of physical activity.;Long Term: Add in home exercise to make exercise part of routine and to increase amount of physical activity.;Long Term: Exercising regularly at least 3-5 days a week.       Increase Strength and Stamina Yes       Intervention Provide advice, education, support and counseling about physical activity/exercise needs.;Develop an individualized exercise prescription for aerobic and resistive training based on initial evaluation findings, risk stratification, comorbidities and participant's personal goals.       Expected Outcomes Short Term: Increase workloads from initial exercise prescription for resistance, speed, and METs.;Short Term: Perform resistance training exercises routinely during rehab and add in resistance training at home;Long Term: Improve cardiorespiratory fitness, muscular endurance and strength as measured by increased METs and functional capacity ( )        Able to understand and use rate of perceived exertion (RPE) scale Yes       Intervention Provide education and explanation on how to use RPE scale       Expected Outcomes Short Term: Able to use RPE daily in rehab to express subjective intensity level;Long Term:  Able to use RPE to guide intensity level when exercising independently       Able to understand and use Dyspnea scale Yes       Intervention Provide education and explanation on how to use Dyspnea scale       Expected Outcomes Short Term: Able to use Dyspnea scale daily in rehab to express subjective sense of shortness of breath during exertion;Long Term: Able to use Dyspnea scale to guide intensity level when exercising independently       Knowledge and understanding of Target Heart Rate Range (THRR) Yes       Intervention Provide education and explanation of THRR including how the numbers were predicted and where they are located for reference       Expected Outcomes Short Term: Able to state/look up THRR;Long Term: Able to use THRR to govern intensity when exercising independently;Short Term: Able to use daily as guideline for intensity in rehab       Understanding of Exercise Prescription Yes       Intervention Provide education, explanation, and written materials on patient's individual exercise prescription       Expected Outcomes Short Term: Able to explain program exercise prescription;Long Term: Able to explain home exercise prescription to exercise independently          Exercise Goals Re-Evaluation:  Exercise Goals Re-Evaluation     Row Name 06/15/24 1625 07/09/24 1025           Exercise Goal Re-Evaluation   Exercise Goals Review Increase Physical Activity;Able to understand and use Dyspnea scale;Understanding of Exercise Prescription;Increase Strength and Stamina;Knowledge and understanding of Target Heart Rate Range (THRR);Able to understand and use rate of perceived exertion (RPE) scale Increase Physical Activity;Able  to understand and use Dyspnea scale;Understanding of Exercise Prescription;Increase Strength and Stamina;Knowledge and understanding of Target Heart Rate Range (THRR);Able to understand and  use rate of perceived exertion (RPE) scale      Comments [name] has completed 5 exercise sessions. She exercises for 15 min on the Octane and treadmill. She averages 2.8 METs at level 2/3 on the Octane and 3.89 METs at 2.6 mph and 2.5% incline on the treadmill. Pt performs the warmup and cooldown standing without limitations. [name] has increased her level on the Octane and speed and incline on the treadmill. She tolerates progressions well. Pt seems very motivated to exercise. Will continue to monitor and progress as able. [name] has completed 6 exercise sessions. She exercises for 15 min on the Octane and treadmill. She averages 2.8 METs at level 2/3 on the Octane and 3.89 METs at 2.6 mph and 2.5% incline on the treadmill. Pt performs the warmup and cooldown standing without limitations. [name] has missed a few weeks of exercise due to COVID infection. [name] is discharging due to uninterest. She plans to exercise on her own.      Expected Outcomes Through exercise at rehab and home, the patient will decrease shortness of breath with daily activities and feel confident in carrying out an exercise regimen at home. Through exercise at rehab and home, the patient will decrease shortness of breath with daily activities and feel confident in carrying out an exercise regimen at home.         Nutrition & Weight - Outcomes:    Nutrition:  Nutrition Therapy & Goals - 06/01/24 1213       Nutrition Therapy   Diet General Healthy Diet    Drug/Food Interactions Statins/Certain Fruits      Personal Nutrition Goals   Nutrition Goal Patient to improve diet quality by using the plate method as a guide for meal planning to include lean protein/plant protein, fruits, vegetables, whole grains, nonfat dairy as part of a  well-balanced diet.    Comments Patient has medical history of COPD4, HTN, CAD, CHF,  hyperlipidemia, aortic atherosclerosis, fatty liver, DM2. LDL is well controlled; she continues statin. A1c is not at goal; treated with metformin , sliding scale insulin . She does take 10mg  prednisone  daily. She reports following a diabetic diet. Patient will benefit from participation in ipulmonary rehab for nutrition education, exercise, and lifestyle modification.      Intervention Plan   Intervention Prescribe, educate and counsel regarding individualized specific dietary modifications aiming towards targeted core components such as weight, hypertension, lipid management, diabetes, heart failure and other comorbidities.;Nutrition handout(s) given to patient.    Expected Outcomes Short Term Goal: Understand basic principles of dietary content, such as calories, fat, sodium, cholesterol and nutrients.;Long Term Goal: Adherence to prescribed nutrition plan.          Nutrition Discharge:   Education Questionnaire Score:  Knowledge Questionnaire Score - 05/28/24 0931       Knowledge Questionnaire Score   Pre Score 17/18          Goals reviewed with patient; copy given to patient.

## 2024-07-14 NOTE — Addendum Note (Signed)
 Encounter addended by: Nicholaus Johnnie PARAS on: 07/14/2024 11:05 AM  Actions taken: Flowsheet accepted

## 2024-07-14 NOTE — Addendum Note (Signed)
 Encounter addended by: Claudene Augustin NOVAK, RRT on: 07/14/2024 11:10 AM  Actions taken: Flowsheet accepted, Episode resolved, Clinical Note Signed

## 2024-07-15 ENCOUNTER — Other Ambulatory Visit: Payer: Self-pay | Admitting: Pulmonary Disease

## 2024-07-15 ENCOUNTER — Other Ambulatory Visit: Payer: Self-pay | Admitting: Cardiology

## 2024-07-15 ENCOUNTER — Other Ambulatory Visit: Payer: Self-pay | Admitting: Internal Medicine

## 2024-07-15 ENCOUNTER — Encounter (HOSPITAL_COMMUNITY)

## 2024-07-16 ENCOUNTER — Ambulatory Visit: Admitting: Internal Medicine

## 2024-07-19 ENCOUNTER — Ambulatory Visit (INDEPENDENT_AMBULATORY_CARE_PROVIDER_SITE_OTHER): Admitting: Internal Medicine

## 2024-07-19 ENCOUNTER — Other Ambulatory Visit: Payer: Self-pay | Admitting: Internal Medicine

## 2024-07-19 ENCOUNTER — Encounter: Payer: Self-pay | Admitting: Internal Medicine

## 2024-07-19 ENCOUNTER — Ambulatory Visit: Payer: Self-pay | Admitting: Internal Medicine

## 2024-07-19 VITALS — BP 118/76 | HR 71 | Temp 98.6°F | Ht 61.0 in | Wt 189.0 lb

## 2024-07-19 DIAGNOSIS — I10 Essential (primary) hypertension: Secondary | ICD-10-CM | POA: Diagnosis not present

## 2024-07-19 DIAGNOSIS — I509 Heart failure, unspecified: Secondary | ICD-10-CM

## 2024-07-19 DIAGNOSIS — Z7984 Long term (current) use of oral hypoglycemic drugs: Secondary | ICD-10-CM

## 2024-07-19 DIAGNOSIS — J9610 Chronic respiratory failure, unspecified whether with hypoxia or hypercapnia: Secondary | ICD-10-CM | POA: Diagnosis not present

## 2024-07-19 DIAGNOSIS — E119 Type 2 diabetes mellitus without complications: Secondary | ICD-10-CM | POA: Diagnosis not present

## 2024-07-19 DIAGNOSIS — E785 Hyperlipidemia, unspecified: Secondary | ICD-10-CM

## 2024-07-19 DIAGNOSIS — E559 Vitamin D deficiency, unspecified: Secondary | ICD-10-CM

## 2024-07-19 DIAGNOSIS — J441 Chronic obstructive pulmonary disease with (acute) exacerbation: Secondary | ICD-10-CM

## 2024-07-19 DIAGNOSIS — J9611 Chronic respiratory failure with hypoxia: Secondary | ICD-10-CM

## 2024-07-19 DIAGNOSIS — J9612 Chronic respiratory failure with hypercapnia: Secondary | ICD-10-CM

## 2024-07-19 LAB — BASIC METABOLIC PANEL WITH GFR
BUN: 14 mg/dL (ref 6–23)
CO2: 35 meq/L — ABNORMAL HIGH (ref 19–32)
Calcium: 9.5 mg/dL (ref 8.4–10.5)
Chloride: 101 meq/L (ref 96–112)
Creatinine, Ser: 0.67 mg/dL (ref 0.40–1.20)
GFR: 89.67 mL/min (ref >60.00)
Glucose, Bld: 126 mg/dL — ABNORMAL HIGH (ref 70–99)
Potassium: 3.7 meq/L (ref 3.5–5.1)
Sodium: 140 meq/L (ref 135–145)

## 2024-07-19 LAB — HEPATIC FUNCTION PANEL
ALT: 22 U/L (ref 0–35)
AST: 13 U/L (ref 0–37)
Albumin: 4.2 g/dL (ref 3.5–5.2)
Alkaline Phosphatase: 63 U/L (ref 39–117)
Bilirubin, Direct: 0 mg/dL (ref 0.0–0.3)
Total Bilirubin: 0.3 mg/dL (ref 0.2–1.2)
Total Protein: 6.8 g/dL (ref 6.0–8.3)

## 2024-07-19 LAB — CBC WITH DIFFERENTIAL/PLATELET
Basophils Absolute: 0 K/uL (ref 0.0–0.1)
Basophils Relative: 0.4 % (ref 0.0–3.0)
Eosinophils Absolute: 0.3 K/uL (ref 0.0–0.7)
Eosinophils Relative: 2.9 % (ref 0.0–5.0)
HCT: 36.6 % (ref 36.0–46.0)
Hemoglobin: 12.2 g/dL (ref 12.0–15.0)
Lymphocytes Relative: 30.9 % (ref 12.0–46.0)
Lymphs Abs: 3 K/uL (ref 0.7–4.0)
MCHC: 33.2 g/dL (ref 30.0–36.0)
MCV: 85.8 fl (ref 78.0–100.0)
Monocytes Absolute: 0.9 K/uL (ref 0.1–1.0)
Monocytes Relative: 9 % (ref 3.0–12.0)
Neutro Abs: 5.6 K/uL (ref 1.4–7.7)
Neutrophils Relative %: 56.8 % (ref 43.0–77.0)
Platelets: 222 K/uL (ref 150.0–400.0)
RBC: 4.27 Mil/uL (ref 3.87–5.11)
RDW: 14.4 % (ref 11.5–15.5)
WBC: 9.8 K/uL (ref 4.0–10.5)

## 2024-07-19 LAB — LIPID PANEL
Cholesterol: 136 mg/dL (ref 0–200)
HDL: 43.4 mg/dL (ref >39.00)
LDL Cholesterol: 55 mg/dL (ref 0–99)
NonHDL: 92.21
Total CHOL/HDL Ratio: 3
Triglycerides: 184 mg/dL — ABNORMAL HIGH (ref 0.0–149.0)
VLDL: 36.8 mg/dL (ref 0.0–40.0)

## 2024-07-19 LAB — BRAIN NATRIURETIC PEPTIDE: Pro B Natriuretic peptide (BNP): 55 pg/mL (ref 0.0–100.0)

## 2024-07-19 LAB — VITAMIN D 25 HYDROXY (VIT D DEFICIENCY, FRACTURES): VITD: 34.27 ng/mL (ref 30.00–100.00)

## 2024-07-19 LAB — HEMOGLOBIN A1C: Hgb A1c MFr Bld: 8.1 % — ABNORMAL HIGH (ref 4.6–6.5)

## 2024-07-19 MED ORDER — TRULICITY 4.5 MG/0.5ML ~~LOC~~ SOAJ: 4.5000 mg | SUBCUTANEOUS | 3 refills | Status: AC

## 2024-07-19 MED ORDER — METFORMIN HCL ER 500 MG PO TB24: ORAL_TABLET | ORAL | 3 refills | Status: AC

## 2024-07-19 NOTE — Patient Instructions (Signed)
 Please take 20 mg prednisone  for 5 days, then back to 10 mg per day  Please continue all other medications as before, and refills have been done if requested.  Please have the pharmacy call with any other refills you may need.  Please continue your efforts at being more active, low cholesterol diet, and weight control.  Please keep your appointments with your specialists as you may have planned  - Cardiology tomorrow  Please go to the LAB at the blood drawing area for the tests to be done  You will be contacted by phone if any changes need to be made immediately.  Otherwise, you will receive a letter about your results with an explanation, but please check with MyChart first.  Please make an Appointment to return in 6 months, or sooner if needed

## 2024-07-19 NOTE — Progress Notes (Unsigned)
 Patient ID: [name], female   DOB: 12-08-1955, 68 y.o.   MRN: [id number]        Chief Complaint: follow up copd exacerbation mild, CHF, chronic hypoxic resp failure, low vit d. Hld, dm, htn       HPI:  [name] is a 68 y.o. female here with c/w mild worsening dyspnea on exertion with mild wheezing but no pain, fever or prod cough.  Has gained several lbs but now worsening leg swelling.  Has appt tomorrow with cardiology f/u CHF.  Pt denies chest pain,orthopnea, PND, increased L E swelling, palpitations, dizziness or syncope.   Pt denies polydipsia, polyuria, or new focal neuro s/s.          Wt Readings from Last 3 Encounters:  07/19/24 189 lb (85.7 kg)  07/07/24 182 lb (82.6 kg)  06/25/24 187 lb (84.8 kg)   BP Readings from Last 3 Encounters:  07/19/24 118/76  07/07/24 (!) 140/80  06/25/24 118/70         Past Medical History:  Diagnosis Date   Bronchitis    CAD (coronary artery disease)    a. 02/2002: CABG x3 with LIMA to LAD, RIMA to RCA, and SVG to OM   CHF (congestive heart failure) (HCC)    COPD (chronic obstructive pulmonary disease) (HCC)    Diabetes (HCC)    Headache    Heart disease    Hyperlipidemia    Hypertension    Hypertensive emergency 07/01/2013   Impaired glucose tolerance 08/20/2014   Sinusitis    Past Surgical History:  Procedure Laterality Date   CORONARY ARTERY BYPASS GRAFT  2003   Triple bypass   ESOPHAGOGASTRODUODENOSCOPY Left 06/13/2014   Procedure: ESOPHAGOGASTRODUODENOSCOPY (EGD);  Surgeon: Renaye Sous, MD;  Location: [facility] ENDOSCOPY;  Service: Endoscopy;  Laterality: Left;   RIGID ESOPHAGOSCOPY N/A 06/12/2014   Procedure: RIGID ESOPHAGOSCOPY WITH FOREIGN BODY REMOVAL;  Surgeon: Ana LELON Moccasin, MD;  Location: [facility] OR;  Service: ENT;  Laterality: N/A;    reports that she quit smoking about 5 years ago. Her smoking use included cigarettes. She started smoking about 52 years ago. She has a 23.7 pack-year smoking history. She has never used smokeless  tobacco. She reports that she does not currently use alcohol. She reports that she does not currently use drugs after having used the following drugs: Marijuana. family history includes Brain cancer in her mother; Cancer in her maternal grandmother and another family member; Heart disease in her mother; Lung cancer in her maternal aunt and paternal uncle; Stroke in an other family member; Suicidality in her brother. Allergies  Allergen Reactions   Chantix  [Varenicline ] Nausea And Vomiting   Augmentin  [Amoxicillin -Pot Clavulanate] Nausea And Vomiting   Current Outpatient Medications on File Prior to Visit  Medication Sig Dispense Refill   acetaminophen  (TYLENOL ) 500 MG tablet Take 1,000 mg by mouth 2 (two) times daily as needed for headache, fever, moderate pain or mild pain.     albuterol  (VENTOLIN  HFA) 108 (90 Base) MCG/ACT inhaler INHALE 1-2 PUFFS BY MOUTH EVERY 6 HOURS AS NEEDED FOR WHEEZE OR SHORTNESS OF BREATH 18 each 5   alendronate  (FOSAMAX ) 70 MG tablet TAKE 1 TABLET (70 MG TOTAL) BY MOUTH EVERY 7 DAYS WITH FULL GLASS WATER ON EMPTY STOMACH 12 tablet 3   amLODipine  (NORVASC ) 5 MG tablet TAKE 1 TABLET (5 MG TOTAL) BY MOUTH DAILY. 30 tablet 0   atorvastatin  (LIPITOR) 40 MG tablet TAKE 1 TABLET BY MOUTH EVERY DAY 90 tablet  3   bisoprolol  (ZEBETA ) 5 MG tablet TAKE 1 TABLET (5 MG TOTAL) BY MOUTH DAILY. 90 tablet 2   Cholecalciferol  (VITAMIN D -3 PO) Take 1 capsule by mouth in the morning.     Cyanocobalamin  (VITAMIN B-12 PO) Take 1 tablet by mouth in the morning.     fluticasone  (FLONASE ) 50 MCG/ACT nasal spray Place 1 spray into both nostrils daily. 15.8 mL 0   guaiFENesin -dextromethorphan  (ROBITUSSIN DM) 100-10 MG/5ML syrup Take 5 mLs by mouth every 4 (four) hours as needed for cough. 118 mL 0   ipratropium-albuterol  (DUONEB) 0.5-2.5 (3) MG/3ML SOLN INHALE 3 ML BY NEBULIZER EVERY 6 HOURS AS NEEDED 360 mL 11   isosorbide  mononitrate (IMDUR ) 60 MG 24 hr tablet TAKE 1.5 TABLETS (90 MG TOTAL) BY  MOUTH DAILY. 45 tablet 0   MAGNESIUM  OXIDE PO Take 1 tablet by mouth at bedtime.     Multiple Vitamins-Minerals (MULTIVITAMIN WOMEN 50+) TABS Take 1 tablet by mouth in the morning.     OXYGEN  Inhale 3 L into the lungs continuous.     predniSONE  (DELTASONE ) 10 MG tablet Take 6 tablets (60 mg) daily for 2 days, then, Take 5 tablets (50 mg) daily for 2 days, then, Take 4 tablets (40 mg) daily for 2 days, then, Take 3 tablets (30 mg) daily for 2 days, then Take 2 tablets (20mg ) daily for 2 days then start your home dose of once a day 40 tablet 0   predniSONE  (DELTASONE ) 10 MG tablet Take 1 tablet (10 mg total) by mouth daily with breakfast. After completing taper 90 tablet 1   predniSONE  (DELTASONE ) 10 MG tablet Tale 4 tab by mouth x 3 days, 3 tabs x 3 days, 2 tabs x 3 days, 1 tab x 3 days 30 tablet 0   torsemide  (DEMADEX ) 20 MG tablet TAKE 1 TABLET BY MOUTH EVERY DAY 90 tablet 0   TRELEGY ELLIPTA  200-62.5-25 MCG/ACT AEPB INHALE 1 PUFF BY MOUTH EVERY DAY 60 each 8   potassium chloride  (KLOR-CON  M) 10 MEQ tablet TAKE 1 TABLET BY MOUTH EVERY DAY 90 tablet 1   No current facility-administered medications on file prior to visit.        ROS:  All others reviewed and negative.  Objective        PE:  BP 118/76   Pulse 71   Temp 98.6 F (37 C)   Ht 5' 1 (1.549 m)   Wt 189 lb (85.7 kg)   SpO2 99%   BMI 35.71 kg/m                 Constitutional: Pt appears in NAD, no accessory muscle use               HENT: Head: NCAT.                Right Ear: External ear normal.                 Left Ear: External ear normal.                Eyes: . Pupils are equal, round, and reactive to light. Conjunctivae and EOM are normal               Nose: without d/c or deformity               Neck: Neck supple. Gross normal ROM               Cardiovascular: Normal rate  and regular rhythm.                 Pulmonary/Chest: Effort normal and breath sounds decreased  without rales but with few scattered wheezing.                 Abd:  Soft, NT, ND, + BS, no organomegaly               Neurological: Pt is alert. At baseline orientation, motor grossly intact               Skin: Skin is warm. No rashes, no other new lesions, LE edema - none               Psychiatric: Pt behavior is normal without agitation   Micro: none  Cardiac tracings I have personally interpreted today:  none  Pertinent Radiological findings (summarize): none   Lab Results  Component Value Date   WBC 9.8 07/19/2024   HGB 12.2 07/19/2024   HCT 36.6 07/19/2024   PLT 222.0 07/19/2024   GLUCOSE 126 (H) 07/19/2024   CHOL 136 07/19/2024   TRIG 184.0 (H) 07/19/2024   HDL 43.40 07/19/2024   LDLDIRECT 82.0 04/17/2023   LDLCALC 55 07/19/2024   ALT 22 07/19/2024   AST 13 07/19/2024   NA 140 07/19/2024   K 3.7 07/19/2024   CL 101 07/19/2024   CREATININE 0.67 07/19/2024   BUN 14 07/19/2024   CO2 35 (H) 07/19/2024   TSH 2.34 03/11/2024   INR 1.0 12/07/2023   HGBA1C 8.1 (H) 07/19/2024   MICROALBUR 6.2 (H) 03/11/2024   Assessment/Plan:  [name] is a 68 y.o. Other or two or more races [6] female with  has a past medical history of Bronchitis, CAD (coronary artery disease), CHF (congestive heart failure) (HCC), COPD (chronic obstructive pulmonary disease) (HCC), Diabetes (HCC), Headache, Heart disease, Hyperlipidemia, Hypertension, Hypertensive emergency (07/01/2013), Impaired glucose tolerance (08/20/2014), and Sinusitis.  Chronic respiratory failure with hypoxia and hypercapnia (HCC) Stable, cont home o2 3L [place] continuous, f/u pulmonary as planned  CHF (congestive heart failure) (HCC) Though wt is up 7n lbs from 2 wks ago, her volume appears stable; pt to continue current med tx, f/u cardiology as planned  Vitamin D  deficiency Last vitamin D  Lab Results  Component Value Date   VD25OH 34.27 07/19/2024   Low, to start oral replacement   Non-insulin  dependent type 2 diabetes mellitus (HCC) Lab Results  Component Value  Date   HGBA1C 8.1 (H) 07/19/2024   uncontrolled, pt to continue current medical treatment with increased trulicity  to 4.5 mg weekly, and increased metformin  ER 500 mg to 3 tabs in the am   Hyperlipidemia Lab Results  Component Value Date   LDLCALC 55 07/19/2024   Stable, pt to continue current statin liptior 40 mg qd   Essential hypertension BP Readings from Last 3 Encounters:  07/19/24 118/76  07/07/24 (!) 140/80  06/25/24 118/70   Stable, pt to continue medical treatment norvasc  5 mg every day, zebeta  5 mg qd   COPD exacerbation (HCC) Mild, for increased prednisone  to 20 mg every day x 5 days, continue inhaler  Followup: Return in about 6 months (around 01/16/2025).  Lynwood Rush, MD 07/20/2024 7:55 PM [facility]  Primary Care - [facility]

## 2024-07-20 ENCOUNTER — Encounter (HOSPITAL_COMMUNITY)

## 2024-07-20 ENCOUNTER — Encounter: Payer: Self-pay | Admitting: Internal Medicine

## 2024-07-20 NOTE — Assessment & Plan Note (Signed)
 Lab Results  Component Value Date   LDLCALC 55 07/19/2024   Stable, pt to continue current statin liptior 40 mg qd

## 2024-07-20 NOTE — Assessment & Plan Note (Signed)
 Though wt is up 7n lbs from 2 wks ago, her volume appears stable; pt to continue current med tx, f/u cardiology as planned

## 2024-07-20 NOTE — Assessment & Plan Note (Signed)
 Stable, cont home o2 3L [place] continuous, f/u pulmonary as planned

## 2024-07-20 NOTE — Assessment & Plan Note (Signed)
 Lab Results  Component Value Date   HGBA1C 8.1 (H) 07/19/2024   uncontrolled, pt to continue current medical treatment with increased trulicity  to 4.5 mg weekly, and increased metformin  ER 500 mg to 3 tabs in the am

## 2024-07-20 NOTE — Assessment & Plan Note (Signed)
 BP Readings from Last 3 Encounters:  07/19/24 118/76  07/07/24 (!) 140/80  06/25/24 118/70   Stable, pt to continue medical treatment norvasc  5 mg every day, zebeta  5 mg qd

## 2024-07-20 NOTE — Assessment & Plan Note (Signed)
 Last vitamin D  Lab Results  Component Value Date   VD25OH 34.27 07/19/2024   Low, to start oral replacement

## 2024-07-20 NOTE — Assessment & Plan Note (Signed)
 Mild, for increased prednisone  to 20 mg every day x 5 days, continue inhaler

## 2024-07-21 ENCOUNTER — Encounter: Payer: Self-pay | Admitting: Internal Medicine

## 2024-07-22 ENCOUNTER — Encounter: Payer: Self-pay | Admitting: Internal Medicine

## 2024-07-22 ENCOUNTER — Encounter (HOSPITAL_COMMUNITY)

## 2024-07-23 MED ORDER — TRAZODONE HCL 100 MG PO TABS: 100.0000 mg | ORAL_TABLET | Freq: Every day | ORAL | 1 refills | Status: AC

## 2024-07-27 ENCOUNTER — Encounter (HOSPITAL_COMMUNITY)

## 2024-07-27 DIAGNOSIS — K5730 Diverticulosis of large intestine without perforation or abscess without bleeding: Secondary | ICD-10-CM | POA: Diagnosis not present

## 2024-07-27 DIAGNOSIS — Z1211 Encounter for screening for malignant neoplasm of colon: Secondary | ICD-10-CM | POA: Diagnosis not present

## 2024-07-27 DIAGNOSIS — K5904 Chronic idiopathic constipation: Secondary | ICD-10-CM | POA: Diagnosis not present

## 2024-07-27 DIAGNOSIS — K760 Fatty (change of) liver, not elsewhere classified: Secondary | ICD-10-CM | POA: Diagnosis not present

## 2024-07-28 ENCOUNTER — Telehealth: Payer: Self-pay

## 2024-07-28 ENCOUNTER — Ambulatory Visit: Attending: Cardiology | Admitting: Cardiology

## 2024-07-28 ENCOUNTER — Encounter: Payer: Self-pay | Admitting: Cardiology

## 2024-07-28 VITALS — BP 128/62 | HR 74 | Ht 61.0 in | Wt 183.8 lb

## 2024-07-28 DIAGNOSIS — I25709 Atherosclerosis of coronary artery bypass graft(s), unspecified, with unspecified angina pectoris: Secondary | ICD-10-CM | POA: Diagnosis not present

## 2024-07-28 DIAGNOSIS — I5032 Chronic diastolic (congestive) heart failure: Secondary | ICD-10-CM

## 2024-07-28 DIAGNOSIS — I10 Essential (primary) hypertension: Secondary | ICD-10-CM

## 2024-07-28 DIAGNOSIS — R079 Chest pain, unspecified: Secondary | ICD-10-CM | POA: Diagnosis not present

## 2024-07-28 DIAGNOSIS — J4489 Other specified chronic obstructive pulmonary disease: Secondary | ICD-10-CM | POA: Diagnosis not present

## 2024-07-28 NOTE — Patient Instructions (Signed)
 [name] - I am sorry I was unable to reach you today for our scheduled appointment. I work with Norleen Lynwood ORN, MD and am calling to support your healthcare needs. Please contact me at [phone] at your earliest convenience. I look forward to speaking with you soon.   Thank you,   Heddy Shutter, RN, MSN, BSN, CCM [place]  [facility], Population Health Case Manager Phone: [phone]

## 2024-07-28 NOTE — Patient Instructions (Signed)
 Medication Instructions:  Your physician recommends that you continue on your current medications as directed. Please refer to the Current Medication list given to you today.  *If you need a refill on your cardiac medications before your next appointment, please call your pharmacy*  Lab Work: None ordered If you have labs (blood work) drawn today and your tests are completely normal, you will receive your results only by: MyChart Message (if you have MyChart) OR A paper copy in the mail If you have any lab test that is abnormal or we need to change your treatment, we will call you to review the results.  Follow-Up: At [facility], you and your health needs are our priority.  As part of our continuing mission to provide you with exceptional heart care, our providers are all part of one team.  This team includes your primary Cardiologist (physician) and Advanced Practice Providers or APPs (Physician Assistants and Nurse Practitioners) who all work together to provide you with the care you need, when you need it.  Your next appointment:   6 month(s)  Provider:   One of our Advanced Practice Providers (APPs): Morse Clause, PA-C  Lamarr Satterfield, NP Miriam Shams, NP  Olivia Pavy, PA-C Josefa Beauvais, NP  Leontine Salen, PA-C Orren Fabry, PA-C  [place], PA-C Ernest Dick, NP  Damien Braver, NP Jon Hails, PA-C  Waddell Donath, PA-C    Dayna Dunn, PA-C  Scott Weaver, PA-C Lum Louis, NP Katlyn West, NP Callie Goodrich, PA-C  Xika Zhao, NP Sheng Haley, PA-C    Kathleen Johnson, PA-C

## 2024-07-28 NOTE — Progress Notes (Signed)
 Cardiology Office Note:  .   Date:  07/28/2024  ID:  [name], DOB Oct 15, 1956, MRN [id number] PCP: Norleen Lynwood ORN, MD  [facility] Providers Cardiologist:  Oneil Parchment, MD    History of Present Illness: .   [name] is a 68 y.o. female Discussed the use of AI scribe   History of Present Illness [name] is a 68 year old female with end-stage COPD and diastolic heart failure who presents for follow-up after hospitalization for acute on chronic hypoxic respiratory failure.  She was last hospitalized on March 23, 2024, for acute on chronic hypoxic respiratory failure and was discharged with a diagnosis of end-stage COPD. During her hospitalization, she was treated with IV Lasix  and had significant respiratory acidosis with a PCO2 of 90. She was discharged on 20 mg of torsemide  daily, having previously been on Lasix  every other day at home. She is on 3 liters of nasal cannula oxygen  at baseline.  She contracted COVID-19 after her hospitalization, which has worsened her respiratory symptoms. She describes a recent incident where she felt chest pressure after grabbing a shower rail to prevent a fall, resulting in persistent but mild chest tightness.  Her past medical history includes coronary artery disease, prior CABG 22 years ago, tracheomalacia, and a history of acute pancreatitis. She also has a history of tobacco use. She is currently on atorvastatin  40 mg for cholesterol, amlodipine  5 mg for blood pressure, isosorbide , potassium supplements, and torsemide  20 mg daily for fluid management. She occasionally skips a dose of torsemide  if she is going out.  Her echocardiogram showed an ejection fraction of 70-75% and grade one diastolic dysfunction. She reports a history of a minimal blockage noted last year, which is being medically managed. There is a slight increase in heart wall thickness.  She has a history of acute pancreatitis and is now diabetic, with a current A1c  of 8.1. She takes metformin  and Trulicity  for diabetes management. She reports difficulty in controlling her blood sugar due to daily prednisone  use, which was increased during her recent hospitalization.  Her social history includes a healthy diet with limited fried foods, though she enjoys sweets, particularly ice cream and cookies.      Studies Reviewed: SABRA   EKG Interpretation Date/Time:  Wednesday July 28 2024 09:17:29 EDT Ventricular Rate:  74 PR Interval:  140 QRS Duration:  70 QT Interval:  384 QTC Calculation: 426 R Axis:   67  Text Interpretation: Unusual P axis, possible ectopic atrial rhythm with Premature supraventricular complexes When compared with ECG of 18-Mar-2024 16:04, Ectopic atrial rhythm noted Confirmed by Parchment Oneil ([id number]) on 07/28/2024 9:22:39 AM    Results LABS PCO2: 90 (03/23/2024) BNP: 55 ALT: 22 Creatinine: 0.67 A1c: 8.1 LDL: 55 Triglycerides: 184  DIAGNOSTIC Echocardiogram: EF 70-75%, grade 1 diastolic dysfunction (03/19/2024) EKG: No ischemic changes (07/28/2024) Risk Assessment/Calculations:            Physical Exam:   VS:  BP 128/62   Pulse 74   Ht 5' 1 (1.549 m)   Wt 183 lb 12.8 oz (83.4 kg)   SpO2 98%   BMI 34.73 kg/m    Wt Readings from Last 3 Encounters:  07/28/24 183 lb 12.8 oz (83.4 kg)  07/19/24 189 lb (85.7 kg)  07/07/24 182 lb (82.6 kg)    GEN: Well nourished, well developed in no acute distress NECK: No JVD; No carotid bruits CARDIAC: RRR, no murmurs, no rubs, no gallops RESPIRATORY:  Clear to auscultation without rales, wheezing or rhonchi  ABDOMEN: Soft, non-tender, non-distended EXTREMITIES:  No edema; No deformity   ASSESSMENT AND PLAN: .    Assessment and Plan Assessment & Plan End stage chronic obstructive pulmonary disease (COPD) with hypoxic respiratory failure End stage COPD with chronic hypoxic respiratory failure. Baseline oxygen  requirement is 3L nasal cannula. Recent hospitalization for acute on  chronic hypoxic respiratory failure with significant respiratory acidosis (PCO2 of 90). Post-hospitalization, she reports a decline in respiratory function following a COVID-19 infection. Physical exam reveals diminished air movement and subtle wheezing, consistent with severe COPD. No new crackles suggestive of fluid overload.  Acute on chronic diastolic heart failure Diastolic heart failure with recent hospitalization for acute exacerbation. Echocardiogram shows EF of 70-75% with grade 1 diastolic dysfunction and mild left ventricular hypertrophy. Recent BNP level was 55, indicating well-managed fluid status. Reports occasional chest tightness but no significant changes on recent EKG. - Continue torsemide  20 mg daily, with the option to take an extra dose if weight increases by more than 2-3 pounds overnight. - Monitor weight daily to assess fluid status.  Coronary artery disease status post CABG Coronary artery disease status post CABG 22 years ago. Medically managed with no recent significant changes in cardiac status. Reports occasional chest pressure but no new EKG changes suggestive of acute ischemia.  Type 2 diabetes mellitus, steroid-induced, uncontrolled Type 2 diabetes mellitus, likely exacerbated by chronic steroid use for COPD management. Recent A1c is 8.1, indicating suboptimal control. Currently managed with metformin  and Trulicity . Reports difficulty in controlling blood sugar levels, especially post-hospitalization due to increased steroid use. - Continue metformin  and Trulicity  for diabetes management. - Monitor blood glucose levels regularly. - Encourage dietary modifications to reduce sugar intake, particularly sweets.  Hyperlipidemia Hyperlipidemia managed with atorvastatin  40 mg. Recent LDL level is 55, which is at goal. Triglycerides are slightly elevated at 184 but not at a level concerning for pancreatitis. - Continue atorvastatin  40 mg daily.  Fatty liver disease Fatty  liver disease with normal liver function tests (ALT 22). No current evidence of active liver inflammation. - Continue monitoring liver function tests periodically. - Encourage weight management and dietary modifications to prevent progression.  Tobacco use Tobacco use contributing to COPD.          Dispo: 6 mth APP, discussed the importance of APP's in our practice  Signed, Oneil Parchment, MD

## 2024-07-29 ENCOUNTER — Encounter (HOSPITAL_COMMUNITY)

## 2024-08-02 NOTE — Telephone Encounter (Signed)
 Letter has been printed & mailed off today.

## 2024-08-02 NOTE — Telephone Encounter (Signed)
Please Print and Send recent Letter from today

## 2024-08-03 ENCOUNTER — Other Ambulatory Visit: Payer: Self-pay | Admitting: Cardiology

## 2024-08-03 ENCOUNTER — Encounter (HOSPITAL_COMMUNITY)

## 2024-08-03 DIAGNOSIS — Z1212 Encounter for screening for malignant neoplasm of rectum: Secondary | ICD-10-CM | POA: Diagnosis not present

## 2024-08-03 DIAGNOSIS — Z1211 Encounter for screening for malignant neoplasm of colon: Secondary | ICD-10-CM | POA: Diagnosis not present

## 2024-08-05 ENCOUNTER — Encounter (HOSPITAL_COMMUNITY)

## 2024-08-05 ENCOUNTER — Encounter: Payer: Self-pay | Admitting: Cardiology

## 2024-08-05 ENCOUNTER — Ambulatory Visit (HOSPITAL_BASED_OUTPATIENT_CLINIC_OR_DEPARTMENT_OTHER): Admitting: Pulmonary Disease

## 2024-08-05 MED ORDER — AMLODIPINE BESYLATE 5 MG PO TABS: 5.0000 mg | ORAL_TABLET | Freq: Every day | ORAL | 3 refills | Status: AC

## 2024-08-10 ENCOUNTER — Encounter (HOSPITAL_COMMUNITY)

## 2024-08-11 LAB — LAB REPORT - SCANNED: Cologuard: POSITIVE — AB

## 2024-08-12 ENCOUNTER — Other Ambulatory Visit: Payer: Self-pay | Admitting: Internal Medicine

## 2024-08-12 ENCOUNTER — Ambulatory Visit: Payer: Self-pay | Admitting: Internal Medicine

## 2024-08-12 ENCOUNTER — Encounter (HOSPITAL_COMMUNITY)

## 2024-08-12 DIAGNOSIS — R195 Other fecal abnormalities: Secondary | ICD-10-CM

## 2024-08-16 ENCOUNTER — Encounter: Payer: Self-pay | Admitting: Internal Medicine

## 2024-08-17 ENCOUNTER — Other Ambulatory Visit: Payer: Self-pay | Admitting: Gastroenterology

## 2024-08-17 ENCOUNTER — Encounter (HOSPITAL_COMMUNITY)

## 2024-08-19 ENCOUNTER — Encounter (HOSPITAL_COMMUNITY)

## 2024-08-19 ENCOUNTER — Ambulatory Visit: Admitting: Cardiology

## 2024-08-20 ENCOUNTER — Encounter (HOSPITAL_BASED_OUTPATIENT_CLINIC_OR_DEPARTMENT_OTHER): Payer: Self-pay | Admitting: Pulmonary Disease

## 2024-08-20 ENCOUNTER — Ambulatory Visit (INDEPENDENT_AMBULATORY_CARE_PROVIDER_SITE_OTHER): Admitting: Pulmonary Disease

## 2024-08-20 VITALS — BP 125/73 | HR 84 | Ht 61.0 in | Wt 185.0 lb

## 2024-08-20 DIAGNOSIS — J4489 Other specified chronic obstructive pulmonary disease: Secondary | ICD-10-CM | POA: Diagnosis not present

## 2024-08-20 DIAGNOSIS — J9612 Chronic respiratory failure with hypercapnia: Secondary | ICD-10-CM | POA: Diagnosis not present

## 2024-08-20 DIAGNOSIS — J439 Emphysema, unspecified: Secondary | ICD-10-CM

## 2024-08-20 DIAGNOSIS — J9611 Chronic respiratory failure with hypoxia: Secondary | ICD-10-CM

## 2024-08-20 NOTE — Patient Instructions (Signed)
  VISIT SUMMARY: Today, we discussed your advanced COPD and chronic respiratory failure, indigestion possibly related to GERD, fluid management, and the high risk of procedural sedation due to your lung condition. We reviewed your current medications and made plans for further evaluation and management of your symptoms.  YOUR PLAN: -ADVANCED CHRONIC OBSTRUCTIVE PULMONARY DISEASE (COPD) WITH CHRONIC RESPIRATORY FAILURE AND HYPOXIA: COPD is a chronic lung disease that makes it hard to breathe and can lead to respiratory failure. You should continue using Trelegy 200 mcg and prednisone  10 mg daily, with the option to increase to 20 mg if your symptoms worsen. Monitor for any exacerbations and contact us  if your symptoms get worse.  -INDIGESTION WITH POSSIBLE GASTROESOPHAGEAL REFLUX DISEASE (GERD): GERD is a condition where stomach acid frequently flows back into the tube connecting your mouth and stomach, causing discomfort. Your indigestion might be related to GERD, possibly worsened by prednisone . You should discuss these symptoms with your primary care physician for further evaluation and management.  -FLUID MANAGEMENT WITH DIURETIC THERAPY: Diuretics help your body get rid of excess fluid by making you urinate more. Continue your current diuretic regimen and adjust the dose based on your fluid status and how often you need to urinate.  -HIGH PROCEDURAL SEDATION RISK DUE TO ADVANCED LUNG DISEASE: Due to your advanced lung disease, you are at high risk for complications from sedation during procedures. You should discuss alternative screening methods for colon cancer with your GI specialist, considering the risks and benefits of a colonoscopy.  INSTRUCTIONS: Please monitor your symptoms and contact us  if they worsen. Discuss your indigestion symptoms with your primary care physician. Talk to your GI specialist about alternative colon cancer screening methods due to the high risk of procedural  sedation.                      Contains text generated by [employer]bridge.                                 Contains text generated by Abridge.

## 2024-08-20 NOTE — Progress Notes (Addendum)
 Subjective:    Patient ID: [name], female    DOB: Oct 09, 1956, 68 y.o.   MRN: [id number]   68 yo smoker for FU of severe refractory COPD, quit 11/2017 and mild tracheomalacia with chronic hypoxic & hypercarbic resp failure on o2 since 2018 -NIV since 2024 -dupixent  trial did not work   Recurrent exacerbations in 2022, requiring maintenance prednisone  starting 06/2021 -hospitalized 10/2022  for 2 days for COPD exacerbation treated with BiPAP, steroids and bronchodilators    PMH - CABG HFpEF   11/19/23 started dupixent  300 mg SQ q 2 weeks Hosp 11/2023 acute on chronic hypoxic/hypercapnic respiratory failure due to COPD exacerbation due to coronavirus infection. hosp 03/15/2024 for 2 days , Readm 5/29 for shortness of breath, orthopnea and hypoxia to 80s on 3 L nasal cannula >> DNR/I issued  Discussed the use of AI scribe software for clinical note transcription with the patient, who gave verbal consent to proceed.  History of Present Illness [name] is a 68 year old female with advanced refractory COPD and chronic respiratory failure who presents for follow-up.  She manages her condition with non-invasive ventilation and Trelegy 200 mcg. She engages in regular exercise, walking on a treadmill at 2.5 mph on a flat incline. She has received vaccinations for flu, shingles, pneumonia, and RSV. She had COVID-19 two months ago and believes she has some protection from it.  She experiences indigestion, described as a sensation of food being 'stuck in my chest,' especially when drinking cold water. She takes prednisone  10 mg daily and sometimes doubles the dose once or twice a week when symptoms worsen, questioning if it contributes to her indigestion.    NIV download was reviewed >> excellent compliance CXR 5/20 reviewed, clear  Significant tests/ events reviewed   CT angiogram chest 01/2020-emphysema, mild tracheomalacia   04/2019-pulmonary function test- FVC 1.05 (44%  predicted), postbronchodilator ratio 47, postbronchodilator FEV1 0.47 (25% predicted), DLCO 54   Spirometry 02/2017 >> ratio of 59, FEV1 of 42% FVC of 56% ABG 01/2014 was 7.29/66/75/92%   08/2014 >> PSG neg for OSA , Desaturation as low as 85%   05/2023 AEC 319   Review of Systems  neg for any significant sore throat, dysphagia, itching, sneezing, nasal congestion or excess/ purulent secretions, fever, chills, sweats, unintended wt loss, pleuritic or exertional cp, hempoptysis, orthopnea pnd or change in chronic leg swelling. Also denies presyncope, palpitations, heartburn, abdominal pain, nausea, vomiting, diarrhea or change in bowel or urinary habits, dysuria,hematuria, rash, arthralgias, visual complaints, headache, numbness weakness or ataxia.      Objective:   Physical Exam  Gen. Pleasant, obese, in no distress ENT - no lesions, no post nasal drip Neck: No JVD, no thyromegaly, no carotid bruits Lungs: no use of accessory muscles, no dullness to percussion, decreased without rales or rhonchi  Cardiovascular: Rhythm regular, heart sounds  normal, no murmurs or gallops, no peripheral edema Musculoskeletal: No deformities, no cyanosis or clubbing , no tremors       Assessment & Plan:   Assessment and Plan Assessment & Plan Advanced chronic obstructive pulmonary disease (COPD) with chronic respiratory failure and hypoxia on noninvasive ventilation Advanced COPD with chronic respiratory failure and hypoxia, managed with noninvasive ventilation. Continues on Trelegy 200 mcg. Previous trial of Dupixent  was ineffective. Prednisone  10 mg daily, occasionally increased to 20 mg for exacerbations. Concerns about prednisone  side effects, including potential exacerbation of GERD symptoms. - Continue Trelegy 200 mcg. - Continue prednisone  10 mg daily, with  occasional increase to 20 mg as needed for exacerbations. - Monitor for exacerbations and contact provider if symptoms  worsen.  Indigestion with possible gastroesophageal reflux disease (GERD) Reports indigestion with sensation of food being stuck in the chest, possibly related to GERD. Prednisone  may exacerbate GERD symptoms if present. No definitive diagnosis of GERD, but symptoms suggestive. - Discuss symptoms with primary care physician for further evaluation and management.  Fluid management with diuretic therapy On diuretic therapy for fluid management. Reports frequent urination, sometimes leading to skipping doses. No significant swelling noted. - Continue current diuretic regimen, adjusting dose based on fluid status and urination frequency.  High procedural sedation risk due to advanced lung disease High risk for procedural sedation due to advanced lung disease. Scheduled for colonoscopy, but concerned about risks of sedation, including potential need for intubation. Discussed risks and benefits of colonoscopy, considering family history of colon cancer and recent positive Cologuard test. Decision to discuss with GI specialist about alternative screening methods due to high procedural risk. - Discuss with GI specialist about alternative screening methods for colon cancer due to high procedural risk. - Consider risks and benefits of colonoscopy with GI specialist.

## 2024-08-24 ENCOUNTER — Encounter (HOSPITAL_COMMUNITY)

## 2024-08-26 ENCOUNTER — Encounter (HOSPITAL_COMMUNITY)

## 2024-08-27 ENCOUNTER — Encounter (HOSPITAL_COMMUNITY): Payer: Self-pay | Admitting: Gastroenterology

## 2024-08-30 ENCOUNTER — Other Ambulatory Visit: Payer: Self-pay | Admitting: Cardiology

## 2024-08-30 ENCOUNTER — Other Ambulatory Visit: Payer: Self-pay | Admitting: Internal Medicine

## 2024-08-30 ENCOUNTER — Other Ambulatory Visit: Payer: Self-pay | Admitting: Pulmonary Disease

## 2024-08-31 ENCOUNTER — Encounter (HOSPITAL_COMMUNITY)

## 2024-08-31 MED ORDER — BISOPROLOL FUMARATE 5 MG PO TABS: 5.0000 mg | ORAL_TABLET | Freq: Every day | ORAL | 3 refills | Status: AC

## 2024-09-02 ENCOUNTER — Encounter (HOSPITAL_COMMUNITY)

## 2024-09-03 ENCOUNTER — Ambulatory Visit (HOSPITAL_COMMUNITY): Admission: RE | Admit: 2024-09-03 | Source: Home / Self Care | Admitting: Gastroenterology

## 2024-09-03 ENCOUNTER — Encounter (HOSPITAL_COMMUNITY): Admission: RE | Payer: Self-pay | Source: Home / Self Care

## 2024-09-03 SURGERY — COLONOSCOPY
Anesthesia: Monitor Anesthesia Care

## 2024-09-06 ENCOUNTER — Encounter: Payer: Self-pay | Admitting: Internal Medicine

## 2024-09-06 MED ORDER — MELOXICAM 7.5 MG PO TABS: 7.5000 mg | ORAL_TABLET | Freq: Every day | ORAL | 3 refills | Status: AC | PRN

## 2024-09-22 DIAGNOSIS — I700 Atherosclerosis of aorta: Secondary | ICD-10-CM | POA: Diagnosis not present

## 2024-09-22 DIAGNOSIS — E876 Hypokalemia: Secondary | ICD-10-CM | POA: Diagnosis not present

## 2024-09-29 ENCOUNTER — Other Ambulatory Visit: Payer: Self-pay | Admitting: Gastroenterology

## 2024-10-16 ENCOUNTER — Other Ambulatory Visit: Payer: Self-pay | Admitting: Pulmonary Disease

## 2024-10-29 ENCOUNTER — Encounter (HOSPITAL_COMMUNITY): Payer: Self-pay | Admitting: Gastroenterology

## 2024-10-29 NOTE — Progress Notes (Signed)
 [name]    PCP-James MD Cardiologist-Skains MD Pulmonologist-Alva MD  Chest xray-03/18/24 EKG-07/28/24 Echo-03/19/24 Cath-n/a     Stress-2022 ICD/PM-n/a GLP1-Trulicity  hold 1 week last dose 1/14 Blood Thinner-n/a  History: CHF, CAD, COPD, CABG, Diastolic dysfunction, DM. Patient last saw her cardiologist 07/28/24 with recommended f/u 6 months. She sees pulmonology last visit 08/20/24 they did address a clearance at that appt and said she was high risk but to be done in hospital. She is wearing 3L02 continuously, bipap at night, per pt no issues before with sedation but hasn't had since being put on 02. She endorses no new issues with her heart or breathing, no other equipment other than 02 which she will bring day of.     Anesthesia Review- Yes Sounds like she's got good followup. If she's reporting she's currently stable/at baseline then I would anticipate okay to proceed Lynwood Hope PA

## 2024-11-04 ENCOUNTER — Encounter (HOSPITAL_BASED_OUTPATIENT_CLINIC_OR_DEPARTMENT_OTHER): Payer: Self-pay | Admitting: Pulmonary Disease

## 2024-11-04 ENCOUNTER — Ambulatory Visit (HOSPITAL_BASED_OUTPATIENT_CLINIC_OR_DEPARTMENT_OTHER): Admitting: Pulmonary Disease

## 2024-11-04 VITALS — BP 127/75 | HR 74 | Ht 61.0 in | Wt 189.6 lb

## 2024-11-04 DIAGNOSIS — F17210 Nicotine dependence, cigarettes, uncomplicated: Secondary | ICD-10-CM

## 2024-11-04 DIAGNOSIS — J449 Chronic obstructive pulmonary disease, unspecified: Secondary | ICD-10-CM

## 2024-11-04 DIAGNOSIS — M810 Age-related osteoporosis without current pathological fracture: Secondary | ICD-10-CM | POA: Diagnosis not present

## 2024-11-04 DIAGNOSIS — J9612 Chronic respiratory failure with hypercapnia: Secondary | ICD-10-CM | POA: Diagnosis not present

## 2024-11-04 DIAGNOSIS — J4489 Other specified chronic obstructive pulmonary disease: Secondary | ICD-10-CM

## 2024-11-04 DIAGNOSIS — J9611 Chronic respiratory failure with hypoxia: Secondary | ICD-10-CM

## 2024-11-04 NOTE — Patient Instructions (Signed)
" °  VISIT SUMMARY: During your visit, we discussed your chronic respiratory failure and COPD, as well as your concerns about long-term prednisone  use and its effects on your bone health. We also addressed your constipation and its impact on your breathing.  YOUR PLAN: -CHRONIC RESPIRATORY FAILURE WITH HYPOXIA AND HYPERCAPNIA: This condition means that your lungs are not getting enough oxygen  and are retaining too much carbon dioxide. We will continue your current medications, including Trelegy, oxygen , and noninvasive ventilation at night. We will also start you on O2 wear nebulized medication twice daily, pending insurance approval. Continue using albuterol  for emergency situations and monitor your symptoms, adjusting prednisone  use as needed.  -CHRONIC OBSTRUCTIVE PULMONARY DISEASE (COPD): COPD is a chronic inflammatory lung disease that causes obstructed airflow from the lungs. We will continue your current medications, including Trelegy and albuterol , and start you on Ohtuwayre nebulized medication twice daily, pending insurance approval. Monitor your symptoms for improvement and potential reduction in prednisone  use.  -OSTEOPOROSIS: Osteoporosis is a condition where bones become weak and brittle. We will continue to monitor your bone health and consider strategies to manage your osteoporosis.  INSTRUCTIONS: Please follow up with us  in three months to review your progress and adjust your treatment plan as needed. Make sure to monitor your symptoms closely and adjust your prednisone  use as discussed. If you experience any new or worsening symptoms, please contact our office immediately.                      Contains text generated by [employer]bridge.                                 Contains text generated by [employer]bridge.   "

## 2024-11-04 NOTE — Addendum Note (Signed)
 Addended by: TRUDY WARREN CROME on: 11/04/2024 12:08 PM   Modules accepted: Orders

## 2024-11-04 NOTE — Progress Notes (Signed)
 "  Subjective:    Patient ID: [name], female    DOB: 06-05-1956, 69 y.o.   MRN: [id number]   69 yo smoker for FU of severe refractory COPD, quit 11/2017 and mild tracheomalacia with chronic hypoxic & hypercarbic resp failure on o2 since 2018 -NIV since 2024 -dupixent  trial did not work   Recurrent exacerbations in 2022, requiring maintenance prednisone  starting 06/2021 -hospitalized 10/2022  for 2 days for COPD exacerbation treated with BiPAP, steroids and bronchodilators    PMH - CABG HFpEF   11/19/23 started dupixent  300 mg SQ q 2 weeks Hosp 11/2023 acute on chronic hypoxic/hypercapnic respiratory failure due to COPD exacerbation due to coronavirus infection. hosp 03/15/2024 for 2 days , Readm 5/29 for shortness of breath, orthopnea and hypoxia  >> DNR/I issued  Discussed the use of AI scribe software for clinical note transcription with the patient, who gave verbal consent to proceed.  History of Present Illness [name] is a 69 year old female with chronic hypoxic and hypercarbic respiratory failure who presents for a three-month follow-up.  She describes day-to-day fluctuation in breathing with current baseline about 7/10 in severity, with some days as low as 4-5/10. Four days ago she had increased wheezing and doubled her prednisone  dose for two days with improvement. She denies chest colds or respiratory infections in the past three months. She had COVID-19 about five months ago and is up to date on vaccines except for the most recent COVID booster.  She has constipation that causes a sensation of fullness up into her chest which makes breathing feel more difficult. She uses Miralax  and Metamucil but dislikes the effects and is currently using a tea that provides relief.  Her current regimen includes prednisone , a diuretic with potassium, Trelegy, home oxygen , and nocturnal noninvasive ventilation. She uses albuterol  via nebulizer for acute symptoms. She previously tried  Dupixent  without benefit.  She has osteoporosis and is worried about weight gain and bone health from long-term prednisone  use.    Significant tests/ events reviewed   CT angiogram chest 01/2020-emphysema, mild tracheomalacia   04/2019-pulmonary function test- FVC 1.05 (44% predicted), postbronchodilator ratio 47, postbronchodilator FEV1 0.47 (25% predicted), DLCO 54   Spirometry 02/2017 >> ratio of 59, FEV1 of 42% FVC of 56% ABG 01/2014 was 7.29/66/75/92%   08/2014 >> PSG neg for OSA , Desaturation as low as 85%   05/2023 AEC 319   Review of Systems  neg for any significant sore throat, dysphagia, itching, sneezing, nasal congestion or excess/ purulent secretions, fever, chills, sweats, unintended wt loss, pleuritic or exertional cp, hempoptysis, orthopnea pnd or change in chronic leg swelling. Also denies presyncope, palpitations, heartburn, abdominal pain, nausea, vomiting, diarrhea or change in bowel or urinary habits, dysuria,hematuria, rash, arthralgias, visual complaints, headache, numbness weakness or ataxia.      Objective:   Physical Exam  Gen. Pleasant, obese, in no distress ENT - steroid facies, no post nasal drip Neck: No JVD, no thyromegaly, no carotid bruits Lungs: no use of accessory muscles, no dullness to percussion, decreased without rales or rhonchi  Cardiovascular: Rhythm regular, heart sounds  normal, no murmurs or gallops, no peripheral edema Musculoskeletal: No deformities, no cyanosis or clubbing , no tremors       Assessment & Plan:   Assessment and Plan Assessment & Plan Chronic respiratory failure with hypoxia and hypercapnia Chronic hypoxic and hypercarbic respiratory failure with variable symptom control. No recent chest colds or exacerbations. Constipation may affect breathing. Prednisone  use  is variable, with occasional doubling for wheezing. Concerns about long-term prednisone  use include weight gain and osteoporosis. - Continue Trelegy, oxygen ,  and noninvasive ventilation at night. - Will initiate Ohtuwayre nebulized medication twice daily, pending insurance approval. - Continue albuterol  for emergency use. - Monitor symptoms and adjust prednisone  use as needed.  Chronic obstructive pulmonary disease Advanced COPD with variable symptoms. Recent wheezing managed with increased prednisone . Concerns about long-term prednisone  use include weight gain and osteoporosis. O2 wear nebulized medication discussed as an adjunct to current therapy to potentially reduce prednisone  use. - Initiated Ohtuwayre nebulized medication twice daily, pending insurance approval. - Continue current medications including Trelegy and albuterol . - Monitor for improvement in symptoms and potential reduction in prednisone  use.  Osteoporosis Concerns about long-term prednisone  use exacerbating bone density loss. - Continue to monitor bone health and consider osteoporosis management strategies.     "

## 2024-11-10 ENCOUNTER — Other Ambulatory Visit: Payer: Self-pay

## 2024-11-10 NOTE — Patient Outreach (Unsigned)
 Complex Care Management   Visit Note  11/11/2024  Name:  [name] MRN: [id number] DOB: [birth year]  Situation: Referral received for Complex Care Management related to Heart Failure and COPD I obtained verbal consent from Patient.  Visit completed with Patient  on the phone  Background:   Past Medical History:  Diagnosis Date   Bronchitis    CAD (coronary artery disease)    a. 02/2002: CABG x3 with LIMA to LAD, RIMA to RCA, and SVG to OM   CHF (congestive heart failure) (HCC)    COPD (chronic obstructive pulmonary disease) (HCC)    Diabetes (HCC)    Headache    Heart disease    Hyperlipidemia    Hypertension    Hypertensive emergency 07/01/2013   Impaired glucose tolerance 08/20/2014   Sinusitis     Assessment: Patient reports she is managing COPD, HF at this time. She denies any questions or concerns. She denies any care management, education or resource needs.  Patient Reported Symptoms:  Cognitive Cognitive Status: No symptoms reported      Neurological Neurological Review of Symptoms: No symptoms reported    HEENT HEENT Symptoms Reported: No symptoms reported      Cardiovascular Cardiovascular Symptoms Reported: No symptoms reported Does patient have uncontrolled Hypertension?: No Cardiovascular Management Strategies: Medication therapy, Routine screening, Medical device, Exercise, Adequate rest, Activity (patient reports she goes to the gym daily) Do You Have a Working Readable Scale?: Yes Weight: 181 lb (82.1 kg)  Respiratory Respiratory Symptoms Reported: No symptoms reported Other Respiratory Symptoms: continues to wear O2 at 3l/[place].    Endocrine Endocrine Symptoms Reported: No symptoms reported Is patient diabetic?: Yes Is patient checking blood sugars at home?: Yes List most recent blood sugar readings, include date and time of day: patient reports BS a little elevated this morning. she reports BS 171 at 5:30, but reports after coming from the [place], BS  came down to 130. Patient also reports having a cough drop during the night. Patient reports she is taking Prednisone  daily which increases BS and also acknowledges that she likes to eat sweets. RNCM discussed importance of managing BS to prevent long term health problems ie heart disease, kidney diesease, vision problems neuropathy. Patient verbalized understanding. Patient states she know what she needs to do to manage BS. Patient denies education or CM follow up regarding DM management.    Gastrointestinal Gastrointestinal Symptoms Reported: No symptoms reported Other Gastrointestinal Symptoms: Patient also reports she is prepping for colonoscopy.      Genitourinary Genitourinary Symptoms Reported: No symptoms reported    Integumentary Integumentary Symptoms Reported: No symptoms reported    Musculoskeletal Musculoskelatal Symptoms Reviewed: No symptoms reported        Psychosocial Psychosocial Symptoms Reported: No symptoms reported          Today's Vitals   11/10/24 1018  SpO2: 97%  Weight: 181 lb (82.1 kg)   Pain Scale: 0-10 Pain Score: 0-No pain  Medications Reviewed Today     Reviewed by Alba Perillo M, RN (Registered Nurse) on 11/10/24 at 1017  Med List Status: <None>   Medication Order Taking? Sig Documenting Provider Last Dose Status Informant  acetaminophen  (TYLENOL ) 500 MG tablet [id number] Yes Take 1,000 mg by mouth 2 (two) times daily as needed for headache, fever, moderate pain or mild pain. [provider]  Active Pharmacy Records, Self  albuterol  (VENTOLIN  HFA) 108 [phone] Base) MCG/ACT inhaler [id number] Yes INHALE 1-2 PUFFS BY MOUTH EVERY 6 HOURS AS NEEDED  FOR WHEEZE OR SHORTNESS OF SHERIDA [name] Harden LULLA, MD  Active   amLODipine  (NORVASC ) 5 MG tablet [id number] Yes Take 1 tablet (5 mg total) by mouth daily. Jeffrie Oneil BROCKS, MD  Active   atorvastatin  (LIPITOR) 40 MG tablet [id number] Yes TAKE 1 TABLET BY MOUTH EVERY DAY Norleen Lynwood ORN, MD  Active   bisoprolol   (ZEBETA ) 5 MG tablet [id number] Yes Take 1 tablet (5 mg total) by mouth daily. Jeffrie Oneil BROCKS, MD  Active   Cholecalciferol  (VITAMIN D -3 PO) [id number] Yes Take 1 capsule by mouth in the morning. [provider]  Active Pharmacy Records, Self  Cyanocobalamin  (VITAMIN B-12 PO) [id number] Yes Take 1 tablet by mouth in the morning. [provider]  Active Pharmacy Records, Self  Dulaglutide  (TRULICITY ) 4.5 MG/0.5ML EMMANUEL [id number] Yes Inject 4.5 mg as directed once a week. Norleen Lynwood ORN, MD  Active   fluticasone  (FLONASE ) 50 MCG/ACT nasal spray [id number] Yes Place 1 spray into both nostrils daily. Billy Asberry FALCON, PA-C  Active Pharmacy Records, Self  FLUZONE HIGH-DOSE 0.5 ML injection [id number]  Inject 0.5 mLs into the muscle once.  Patient not taking: Reported on 11/10/2024   [provider]  Active   ipratropium-albuterol  (DUONEB) 0.5-2.5 (3) MG/3ML SOLN [id number] Yes INHALE 3 ML BY NEBULIZER EVERY 6 HOURS AS NEEDED [name] Harden LULLA, MD  Active Pharmacy Records, Self  isosorbide  mononitrate (IMDUR ) 60 MG 24 hr tablet [id number] Yes TAKE 1.5 TABLETS (90 MG TOTAL) BY MOUTH DAILY. Jeffrie Oneil BROCKS, MD  Active   MAGNESIUM  OXIDE PO [id number] Yes Take 1 tablet by mouth at bedtime. [provider]  Active Pharmacy Records, Self  meloxicam  (MOBIC ) 7.5 MG tablet [id number] Yes Take 1 tablet (7.5 mg total) by mouth daily as needed for pain. Norleen Lynwood ORN, MD  Active   metFORMIN  (GLUCOPHAGE -XR) 500 MG 24 hr tablet [id number] Yes TAKE 3 TABLET BY MOUTH EVERY DAY WITH BREAKFAST John, James W, MD  Active   Multiple Vitamins-Minerals (MULTIVITAMIN WOMEN 50+) TABS [id number] Yes Take 1 tablet by mouth in the morning. [provider]  Active Pharmacy Records, Self  OXYGEN  [id number] Yes Inhale 3 L into the lungs continuous. [provider]  Active Self  potassium chloride  (KLOR-CON  M) 10 MEQ tablet [id number] Yes TAKE 1 TABLET BY MOUTH EVERY DAY Norleen Lynwood ORN, MD  Active    predniSONE  (DELTASONE ) 10 MG tablet [id number] Yes TAKE 1 TABLET (10 MG TOTAL) BY MOUTH DAILY WITH BREAKFAST. AFTER COMPLETING MARCIANO Norleen Lynwood ORN, MD  Active   torsemide  (DEMADEX ) 20 MG tablet [id number] Yes TAKE 1 TABLET BY MOUTH EVERY DAY Norleen Lynwood ORN, MD  Active   traZODone  (DESYREL ) 100 MG tablet [id number] Yes Take 1 tablet (100 mg total) by mouth at bedtime. Norleen Lynwood ORN, MD  Active   TRELEGY ELLIPTA  200-62.5-25 MCG/ACT AEPB [id number] Yes INHALE 1 PUFF BY MOUTH EVERY DAY [name] Harden LULLA, MD  Active           Recommendation:   Patient to continue to attend provider visits as scheduled/recommended  Follow Up Plan:   Patient has met all care management goals. Care Management case will be closed. Patient has been provided contact information should new needs arise.   Heddy Shutter, RN, MSN, BSN, CCM [place]  [facility], Population Health Case Manager Phone: [phone]

## 2024-11-11 ENCOUNTER — Telehealth: Payer: Self-pay

## 2024-11-11 NOTE — Telephone Encounter (Signed)
 Received [employer]  new start paperwork. Completed form and faxed with clinicals and insurance card copy to [facility]   Phone#: [phone] Fax#: [phone]

## 2024-11-11 NOTE — Patient Instructions (Signed)
 Visit Information  Thank you for taking time to visit with me today. Please don't hesitate to contact me if I can be of assistance to you.  Patient has met all care management goals. Care Management case will be closed. Patient has been provided contact information should new needs arise.   Please call the care guide team at [phone] if you need to cancel, schedule, or reschedule an appointment.   Please call the Suicide and Crisis Lifeline: 988 call the [place]  National Suicide Prevention Lifeline: 802-509-6071 or TTY: (539)641-3168 TTY (317) 704-3669) to talk to a trained counselor if you are experiencing a Mental Health or Behavioral Health Crisis or need someone to talk to.  Lindi Revering, RN, MSN, BSN, CCM [place]  [facility], Population Health Case Manager Phone: [phone]

## 2024-11-11 NOTE — Anesthesia Preprocedure Evaluation (Addendum)
 "                                  Anesthesia Evaluation  Patient identified by MRN, date of birth, ID band Patient awake    Reviewed: Allergy & Precautions, NPO status , Patient's Chart, lab work & pertinent test results  History of Anesthesia Complications Negative for: history of anesthetic complications  Airway Mallampati: II  TM Distance: >3 FB Neck ROM: Full   Comment: Tracheomalacia   Previous grade I view with MAC 4 Dental  (+) Edentulous Upper, Dental Advisory Given 7 teeth on the bottom:   Pulmonary COPD (3L O2 continuously, BiPAP at night; breathing at baseline today),  oxygen  dependent, former smoker Lung nodule   Pulmonary exam normal breath sounds clear to auscultation       Cardiovascular hypertension (amlodipine , bisoprolol , ISMN), Pt. on medications and Pt. on home beta blockers (-) angina + CAD, + CABG (2003) and +CHF  + dysrhythmias  Rhythm:Regular Rate:Normal  HLD  TTE 03/19/2024: IMPRESSIONS    1. Left ventricular ejection fraction, by estimation, is 70 to 75%. The  left ventricle has hyperdynamic function. The left ventricle has no  regional wall motion abnormalities. There is moderate concentric left  ventricular hypertrophy. Left ventricular  diastolic parameters are consistent with Grade I diastolic dysfunction  (impaired relaxation).   2. Right ventricular systolic function is normal. The right ventricular  size is normal.   3. There is no evidence of cardiac tamponade.   4. The mitral valve is normal in structure. Trivial mitral valve  regurgitation. No evidence of mitral stenosis.   5. The aortic valve is tricuspid. There is mild calcification of the  aortic valve. Aortic valve regurgitation is not visualized. Aortic valve  sclerosis/calcification is present, without any evidence of aortic  stenosis.   6. The inferior vena cava is dilated in size with >50% respiratory  variability, suggesting right atrial pressure of 8 mmHg.      Neuro/Psych  Headaches, neg Seizures  Neuromuscular disease (left lumbar radiculopathy)    GI/Hepatic Bowel prep,GERD  ,,(+)     substance abuse  alcohol useFatty liver   Endo/Other  diabetes (Hgb A1c 8.1), Poorly Controlled, Type 2, Oral Hypoglycemic Agents    Renal/GU negative Renal ROS     Musculoskeletal   Abdominal  (+) + obese  Peds  Hematology  (+) Blood dyscrasia (polycythemia) Lab Results      Component                Value               Date                      WBC                      9.8                 07/19/2024                HGB                      12.2                07/19/2024                HCT  36.6                07/19/2024                MCV                      85.8                07/19/2024                PLT                      222.0               07/19/2024              Anesthesia Other Findings Last Trulicity : almost 2 weeks ago  Reproductive/Obstetrics                              Anesthesia Physical Anesthesia Plan  ASA: 4  Anesthesia Plan: MAC   Post-op Pain Management: Minimal or no pain anticipated   Induction: Intravenous  PONV Risk Score and Plan: 2 and Propofol  infusion, TIVA and Treatment may vary due to age or medical condition  Airway Management Planned: Natural Airway and Nasal Cannula  Additional Equipment:   Intra-op Plan:   Post-operative Plan:   Informed Consent: I have reviewed the patients History and Physical, chart, labs and discussed the procedure including the risks, benefits and alternatives for the proposed anesthesia with the patient or authorized representative who has indicated his/her understanding and acceptance.     Dental advisory given  Plan Discussed with: CRNA and Anesthesiologist  Anesthesia Plan Comments: (Discussed with patient risks of MAC including, but not limited to, minor pain or discomfort, hearing people in the room, and possible  need for backup general anesthesia. Risks for general anesthesia also discussed including, but not limited to, sore throat, hoarse voice, chipped/damaged teeth, injury to vocal cords, nausea and vomiting, allergic reactions, lung infection, heart attack, stroke, and death. All questions answered. )         Anesthesia Quick Evaluation  "

## 2024-11-12 ENCOUNTER — Encounter (HOSPITAL_COMMUNITY)
Admission: RE | Disposition: A | Discharge status: Home / Self Care | Payer: Self-pay | Source: Home / Self Care | Attending: Gastroenterology

## 2024-11-12 ENCOUNTER — Ambulatory Visit (HOSPITAL_COMMUNITY): Payer: Self-pay | Admitting: Anesthesiology

## 2024-11-12 ENCOUNTER — Ambulatory Visit (HOSPITAL_COMMUNITY)
Admission: RE | Admit: 2024-11-12 | Discharge: 2024-11-12 | Disposition: A | Discharge status: Home / Self Care | Attending: Gastroenterology | Admitting: Gastroenterology

## 2024-11-12 ENCOUNTER — Other Ambulatory Visit: Payer: Self-pay

## 2024-11-12 ENCOUNTER — Encounter (HOSPITAL_COMMUNITY): Payer: Self-pay | Admitting: Gastroenterology

## 2024-11-12 DIAGNOSIS — Z7984 Long term (current) use of oral hypoglycemic drugs: Secondary | ICD-10-CM | POA: Insufficient documentation

## 2024-11-12 DIAGNOSIS — R911 Solitary pulmonary nodule: Secondary | ICD-10-CM | POA: Insufficient documentation

## 2024-11-12 DIAGNOSIS — Z1211 Encounter for screening for malignant neoplasm of colon: Secondary | ICD-10-CM | POA: Insufficient documentation

## 2024-11-12 DIAGNOSIS — Z79899 Other long term (current) drug therapy: Secondary | ICD-10-CM | POA: Insufficient documentation

## 2024-11-12 DIAGNOSIS — D120 Benign neoplasm of cecum: Secondary | ICD-10-CM | POA: Diagnosis not present

## 2024-11-12 DIAGNOSIS — I2510 Atherosclerotic heart disease of native coronary artery without angina pectoris: Secondary | ICD-10-CM | POA: Diagnosis not present

## 2024-11-12 DIAGNOSIS — D123 Benign neoplasm of transverse colon: Secondary | ICD-10-CM | POA: Diagnosis not present

## 2024-11-12 DIAGNOSIS — Z800 Family history of malignant neoplasm of digestive organs: Secondary | ICD-10-CM | POA: Diagnosis not present

## 2024-11-12 DIAGNOSIS — D122 Benign neoplasm of ascending colon: Secondary | ICD-10-CM | POA: Insufficient documentation

## 2024-11-12 DIAGNOSIS — I509 Heart failure, unspecified: Secondary | ICD-10-CM | POA: Diagnosis not present

## 2024-11-12 DIAGNOSIS — J449 Chronic obstructive pulmonary disease, unspecified: Secondary | ICD-10-CM | POA: Diagnosis not present

## 2024-11-12 DIAGNOSIS — I110 Hypertensive heart disease with heart failure: Secondary | ICD-10-CM

## 2024-11-12 DIAGNOSIS — Z951 Presence of aortocoronary bypass graft: Secondary | ICD-10-CM | POA: Diagnosis not present

## 2024-11-12 DIAGNOSIS — D751 Secondary polycythemia: Secondary | ICD-10-CM | POA: Insufficient documentation

## 2024-11-12 DIAGNOSIS — Z87891 Personal history of nicotine dependence: Secondary | ICD-10-CM | POA: Diagnosis not present

## 2024-11-12 DIAGNOSIS — E1165 Type 2 diabetes mellitus with hyperglycemia: Secondary | ICD-10-CM | POA: Insufficient documentation

## 2024-11-12 DIAGNOSIS — Z9981 Dependence on supplemental oxygen: Secondary | ICD-10-CM | POA: Insufficient documentation

## 2024-11-12 DIAGNOSIS — R195 Other fecal abnormalities: Secondary | ICD-10-CM | POA: Insufficient documentation

## 2024-11-12 LAB — GLUCOSE, CAPILLARY: Glucose-Capillary: 138 mg/dL — ABNORMAL HIGH (ref 70–99)

## 2024-11-12 MED ORDER — PROPOFOL 10 MG/ML IV BOLUS
INTRAVENOUS | Status: DC | PRN
  Administered 2024-11-12: 20 mg via INTRAVENOUS

## 2024-11-12 MED ORDER — PROPOFOL 500 MG/50ML IV EMUL
INTRAVENOUS | Status: DC | PRN
  Administered 2024-11-12: 180 ug/kg/min via INTRAVENOUS

## 2024-11-12 MED ORDER — SODIUM CHLORIDE 0.9 % IV SOLN: INTRAVENOUS | Status: DC

## 2024-11-12 MED ORDER — LACTATED RINGERS IV SOLN
INTRAVENOUS | Status: AC | PRN
  Administered 2024-11-12: 1000 mL via INTRAVENOUS

## 2024-11-12 MED ORDER — LIDOCAINE 2% (20 MG/ML) 5 ML SYRINGE
INTRAMUSCULAR | Status: DC | PRN
  Administered 2024-11-12: 40 mg via INTRAVENOUS

## 2024-11-12 NOTE — Telephone Encounter (Signed)
 Received fax from [facility] confirming receipt of [employer]  enrollemtn form  Patient ID: [id number]

## 2024-11-12 NOTE — Anesthesia Postprocedure Evaluation (Signed)
"   Anesthesia Post Note  Patient: [name]  Procedure(s) Performed: COLONOSCOPY POLYPECTOMY, INTESTINE     Patient location during evaluation: PACU Anesthesia Type: MAC Level of consciousness: awake Pain management: pain level controlled Vital Signs Assessment: post-procedure vital signs reviewed and stable Respiratory status: spontaneous breathing, nonlabored ventilation and respiratory function stable Cardiovascular status: stable and blood pressure returned to baseline Postop Assessment: no apparent nausea or vomiting Anesthetic complications: no   No notable events documented.  Last Vitals:  Vitals:   11/12/24 1020 11/12/24 1030  BP: (!) 121/56 113/61  Pulse: 80 83  Resp: (!) 21 (!) 21  Temp: 36.7 C   SpO2: 97% 98%    Last Pain:  Vitals:   11/12/24 1030  TempSrc:   PainSc: 0-No pain                 [name]      "

## 2024-11-12 NOTE — H&P (Signed)
 [name] HPI: This 70 year old black female presents to the office for colorectal cancer screening. She has to take MiraLax  or Castor oil to facilitate a BM. She has 2-3 BM's per week. There is no obvious blood or mucus in the stool. She has a good appetite and her weight has been stable. She denies having any complaints of abdominal pain, nausea, vomiting, acid reflux, dysphagia or odynophagia. She denies having a family history of celiac sprue or IBD. Her maternal grandmother died of colon cancer. Her last colonoscopy was done on 09/02/2014 which revealed diverticulosis in the entire colon, internal hemorrhoids and hyperplastic polyps were removed.  Past Medical History:  Diagnosis Date   Bronchitis    CAD (coronary artery disease)    a. 02/2002: CABG x3 with LIMA to LAD, RIMA to RCA, and SVG to OM   CHF (congestive heart failure) (HCC)    COPD (chronic obstructive pulmonary disease) (HCC)    Diabetes (HCC)    Headache    Heart disease    Hyperlipidemia    Hypertension    Hypertensive emergency 07/01/2013   Impaired glucose tolerance 08/20/2014   Sinusitis     Past Surgical History:  Procedure Laterality Date   CORONARY ARTERY BYPASS GRAFT  2003   Triple bypass   ESOPHAGOGASTRODUODENOSCOPY Left 06/13/2014   Procedure: ESOPHAGOGASTRODUODENOSCOPY (EGD);  Surgeon: Renaye Sous, MD;  Location[facility] END[facility]COPY;  Service: Endoscopy;  Laterality: Left;   RIGID ESOPHAGOSCOPY N/A 06/12/2014   Procedure: RIGID ESOPHAGOSCOPY WITH FOREIGN BODY REMOVAL;  Surgeon: Ana LELON Moccasin, MD;  Location: [facility] OR;  Service: ENT;  Laterality: N/A;    Family History  Problem Relation Age of Onset   Lung cancer Paternal Uncle    Heart disease Mother    Brain cancer Mother    Cancer Maternal Grandmother        colon   Stroke Other    Cancer Other        x 4 aunts   Lung cancer Maternal Aunt    Suicidality Brother     Social History:  reports that she quit smoking about 5 years ago. Her smoking use  included cigarettes. She started smoking about 52 years ago. She has a 23.7 pack-year smoking history. She has never used smokeless tobacco. She reports that she does not currently use alcohol. She reports that she does not currently use drugs after having used the following drugs: Marijuana.  Allergies: Allergies[1]  Medications: Scheduled: Continuous:  sodium chloride      lactated ringers  1,000 mL (11/12/24 0824)    Results for orders placed or performed during the hospital encounter of 11/12/24 (from the past 24 hours)  Glucose, capillary     Status: Abnormal   Collection Time: 11/12/24  8:22 AM  Result Value Ref Range   Glucose-Capillary 138 (H) 70 - 99 mg/dL     No results found.  ROS:  As stated above in the HPI otherwise negative.  Blood pressure (!) 123/51, pulse 87, temperature (!) 97.5 F (36.4 C), temperature source Temporal, resp. rate 18, height 5' 1 (1.549 m), weight 82.6 kg, SpO2 97%.    PE: Gen: NAD, Alert and Oriented HEENT:  [place]/AT, EOMI Neck: Supple, no LAD Lungs: CTA Bilaterally CV: RRR without M/G/R ABD: Soft, NTND, +BS Ext: No C/C/E  Assessment/Plan: 1) Positive Cologuard - colonoscopy.  Lorisa Scheid D 11/12/2024, 9:38 AM         [1]  Allergies Allergen Reactions   Chantix  [Varenicline ] Nausea And Vomiting  Augmentin  [Amoxicillin -Pot Clavulanate] Nausea And Vomiting

## 2024-11-12 NOTE — Discharge Instructions (Signed)

## 2024-11-12 NOTE — Transfer of Care (Signed)
 Immediate Anesthesia Transfer of Care Note  Patient: [name]  Procedure(s) Performed: COLONOSCOPY POLYPECTOMY, INTESTINE  Patient Location: Endoscopy Unit  Anesthesia Type:MAC  Level of Consciousness: sedated  Airway & Oxygen  Therapy: Patient Spontanous Breathing and Patient connected to nasal cannula oxygen   Post-op Assessment: Report given to RN and Post -op Vital signs reviewed and stable  Post vital signs: Reviewed and stable  Last Vitals:  Vitals Value Taken Time  BP    Temp    Pulse 85 11/12/24 10:20  Resp 21 11/12/24 10:20  SpO2 98 % 11/12/24 10:20  Vitals shown include unfiled device data.  Last Pain:  Vitals:   11/12/24 0808  TempSrc: Temporal  PainSc: 5       Patients Stated Pain Goal: 5 (11/12/24 9191)  Complications: No notable events documented.

## 2024-11-12 NOTE — Op Note (Addendum)
 [facility] Patient Name: [name] Procedure Date: 11/12/2024 MRN: [id number] Attending MD: Belvie Just , MD, [id number] Date of Birth: 07/03/1956 CSN: [id number] Age: 69 Admit Type: Outpatient Procedure:                Colonoscopy Indications:              Positive Cologuard test Providers:                Belvie Just, MD, Jacquelyn Jaci Pierce, RN,                            Curtistine Bishop, Technician Referring MD:              Medicines:                Propofol  per Anesthesia Complications:            No immediate complications. Estimated Blood Loss:     Estimated blood loss: none. Procedure:                Pre-Anesthesia Assessment:                           - Prior to the procedure, a History and Physical                            was performed, and patient medications and                            allergies were reviewed. The patient's tolerance of                            previous anesthesia was also reviewed. The risks                            and benefits of the procedure and the sedation                            options and risks were discussed with the patient.                            All questions were answered, and informed consent                            was obtained. Prior Anticoagulants: The patient has                            taken no anticoagulant or antiplatelet agents. ASA                            Grade Assessment: III - A patient with severe                            systemic disease. After reviewing the risks and  benefits, the patient was deemed in satisfactory                            condition to undergo the procedure.                           - Sedation was administered by an anesthesia                            professional. Deep sedation was attained.                           After obtaining informed consent, the colonoscope                            was passed under direct  vision. Throughout the                            procedure, the patient's blood pressure, pulse, and                            oxygen  saturations were monitored continuously. The                            [id number]-HQ190L ([id number] Olympus colonoscope was                            introduced through the anus and advanced to the the                            cecum, identified by appendiceal orifice and                            ileocecal valve. The colonoscopy was performed                            without difficulty. The patient tolerated the                            procedure well. The quality of the bowel                            preparation was evaluated using the BBPS [facility]                            Bowel Preparation Scale) with scores of: Right                            Colon = 3, Transverse Colon = 3 and Left Colon = 3                            (entire mucosa seen well with no residual staining,  small fragments of stool or opaque liquid). The                            total BBPS score equals 9. The ileocecal valve,                            appendiceal orifice, and rectum were photographed. Scope In: 9:54:47 AM Scope Out: 10:13:28 AM Scope Withdrawal Time: 0 hours 11 minutes 58 seconds  Total Procedure Duration: 0 hours 18 minutes 41 seconds  Findings:      Four sessile polyps were found in the transverse colon, ascending colon       and cecum. The polyps were 2 to 3 mm in size. These polyps were removed       with a cold snare. Resection and retrieval were complete.      Scattered large-mouthed, medium-mouthed and small-mouthed diverticula       were found in the sigmoid colon, hepatic flexure and ascending colon. Impression:               - Four 2 to 3 mm polyps in the transverse colon, in                            the ascending colon and in the cecum, removed with                            a cold snare. Resected and retrieved.                            - Diverticulosis in the sigmoid colon, at the                            hepatic flexure and in the ascending colon. Moderate Sedation:      Not Applicable - Patient had care per Anesthesia. Recommendation:           - Patient has a contact number available for                            emergencies. The signs and symptoms of potential                            delayed complications were discussed with the                            patient. Return to normal activities tomorrow.                            Written discharge instructions were provided to the                            patient.                           - Resume regular diet.                           -  Continue present medications.                           - Repeat colonoscopy in 5 years for surveillance. Procedure Code(s):        --- Professional ---                           [phone], Colonoscopy, flexible; with removal of                            tumor(s), polyp(s), or other lesion(s) by snare                            technique Diagnosis Code(s):        --- Professional ---                           R19.5, Other fecal abnormalities                           D12.3, Benign neoplasm of transverse colon (hepatic                            flexure or splenic flexure)                           D12.2, Benign neoplasm of ascending colon                           D12.0, Benign neoplasm of cecum                           K57.30, Diverticulosis of large intestine without                            perforation or abscess without bleeding CPT copyright 2022 [employer]. All rights reserved. The codes documented in this report are preliminary and upon coder review may  be revised to meet current compliance requirements. Belvie Just, MD Belvie Just, MD 11/12/2024 10:25:38 AM This report has been signed electronically. Number of Addenda: 0

## 2024-11-14 ENCOUNTER — Encounter (HOSPITAL_COMMUNITY): Payer: Self-pay | Admitting: Gastroenterology

## 2024-11-15 LAB — SURGICAL PATHOLOGY

## 2024-11-23 ENCOUNTER — Encounter: Payer: Self-pay | Admitting: Family Medicine

## 2024-11-24 NOTE — Telephone Encounter (Signed)
 Not a pt of Vickie until June

## 2024-11-25 NOTE — Telephone Encounter (Signed)
 Received fax from [facility] confirming receipt of rx.

## 2024-11-29 ENCOUNTER — Ambulatory Visit: Admitting: Family Medicine

## 2025-01-17 ENCOUNTER — Ambulatory Visit: Admitting: Internal Medicine

## 2025-01-18 ENCOUNTER — Ambulatory Visit: Admitting: Family Medicine

## 2025-02-10 ENCOUNTER — Ambulatory Visit (HOSPITAL_BASED_OUTPATIENT_CLINIC_OR_DEPARTMENT_OTHER): Admitting: Pulmonary Disease

## 2025-04-18 ENCOUNTER — Ambulatory Visit: Admitting: Family Medicine

## 2025-06-20 ENCOUNTER — Ambulatory Visit
